# Patient Record
Sex: Male | Born: 1965 | Race: Black or African American | Hispanic: No | Marital: Married | Smoking: Former smoker
Health system: Southern US, Community
[De-identification: ages and names within clinical notes are randomized; demographics above are authoritative.]

## PROBLEM LIST (undated history)

## (undated) DIAGNOSIS — J869 Pyothorax without fistula: Secondary | ICD-10-CM

## (undated) DIAGNOSIS — M79673 Pain in unspecified foot: Secondary | ICD-10-CM

## (undated) DIAGNOSIS — J948 Other specified pleural conditions: Secondary | ICD-10-CM

## (undated) DIAGNOSIS — IMO0001 Reserved for inherently not codable concepts without codable children: Secondary | ICD-10-CM

## (undated) DIAGNOSIS — J80 Acute respiratory distress syndrome: Secondary | ICD-10-CM

## (undated) DIAGNOSIS — I10 Essential (primary) hypertension: Secondary | ICD-10-CM

## (undated) DIAGNOSIS — E786 Lipoprotein deficiency: Secondary | ICD-10-CM

## (undated) DIAGNOSIS — E119 Type 2 diabetes mellitus without complications: Secondary | ICD-10-CM

## (undated) DIAGNOSIS — Z9689 Presence of other specified functional implants: Secondary | ICD-10-CM

## (undated) DIAGNOSIS — E781 Pure hyperglyceridemia: Secondary | ICD-10-CM

## (undated) HISTORY — DX: Pure hyperglyceridemia: E78.1

## (undated) HISTORY — DX: Presence of other specified functional implants: Z96.89

## (undated) HISTORY — DX: Acute respiratory distress syndrome: J80

## (undated) HISTORY — DX: Pyothorax without fistula: J86.9

## (undated) HISTORY — DX: Lipoprotein deficiency: E78.6

## (undated) HISTORY — DX: Other specified pleural conditions: J94.8

## (undated) HISTORY — DX: Reserved for inherently not codable concepts without codable children: IMO0001

---
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
## 2005-06-07 ENCOUNTER — Emergency Department: Payer: Self-pay | Admitting: General Practice

## 2005-06-10 ENCOUNTER — Emergency Department: Payer: Self-pay | Admitting: Emergency Medicine

## 2007-08-12 ENCOUNTER — Emergency Department: Payer: Self-pay | Admitting: Emergency Medicine

## 2008-06-14 ENCOUNTER — Emergency Department: Payer: Self-pay | Admitting: Emergency Medicine

## 2008-08-24 ENCOUNTER — Emergency Department: Payer: Self-pay | Admitting: Emergency Medicine

## 2008-09-01 ENCOUNTER — Emergency Department: Payer: Self-pay | Admitting: Internal Medicine

## 2014-03-13 ENCOUNTER — Emergency Department: Payer: Self-pay | Admitting: Emergency Medicine

## 2014-12-23 ENCOUNTER — Ambulatory Visit: Payer: Self-pay | Admitting: Family Medicine

## 2014-12-23 DIAGNOSIS — E1142 Type 2 diabetes mellitus with diabetic polyneuropathy: Secondary | ICD-10-CM | POA: Insufficient documentation

## 2015-01-25 ENCOUNTER — Ambulatory Visit: Payer: Self-pay | Admitting: Family Medicine

## 2015-05-21 ENCOUNTER — Emergency Department
Admission: EM | Admit: 2015-05-21 | Discharge: 2015-05-21 | Disposition: A | Discharge status: Home / Self Care | Payer: No Typology Code available for payment source | Attending: Emergency Medicine | Admitting: Emergency Medicine

## 2015-05-21 ENCOUNTER — Encounter: Payer: Self-pay | Admitting: *Deleted

## 2015-05-21 ENCOUNTER — Emergency Department: Payer: No Typology Code available for payment source

## 2015-05-21 DIAGNOSIS — S56417A Strain of extensor muscle, fascia and tendon of right little finger at forearm level, initial encounter: Secondary | ICD-10-CM | POA: Diagnosis not present

## 2015-05-21 DIAGNOSIS — W010XXA Fall on same level from slipping, tripping and stumbling without subsequent striking against object, initial encounter: Secondary | ICD-10-CM | POA: Insufficient documentation

## 2015-05-21 DIAGNOSIS — M20031 Swan-neck deformity of right finger(s): Secondary | ICD-10-CM

## 2015-05-21 DIAGNOSIS — Y92009 Unspecified place in unspecified non-institutional (private) residence as the place of occurrence of the external cause: Secondary | ICD-10-CM | POA: Diagnosis not present

## 2015-05-21 DIAGNOSIS — Z87891 Personal history of nicotine dependence: Secondary | ICD-10-CM | POA: Insufficient documentation

## 2015-05-21 DIAGNOSIS — E119 Type 2 diabetes mellitus without complications: Secondary | ICD-10-CM | POA: Insufficient documentation

## 2015-05-21 DIAGNOSIS — S6991XA Unspecified injury of right wrist, hand and finger(s), initial encounter: Secondary | ICD-10-CM | POA: Diagnosis present

## 2015-05-21 DIAGNOSIS — I10 Essential (primary) hypertension: Secondary | ICD-10-CM | POA: Diagnosis not present

## 2015-05-21 DIAGNOSIS — Y9389 Activity, other specified: Secondary | ICD-10-CM | POA: Diagnosis not present

## 2015-05-21 DIAGNOSIS — Y998 Other external cause status: Secondary | ICD-10-CM | POA: Diagnosis not present

## 2015-05-21 DIAGNOSIS — M6789 Other specified disorders of synovium and tendon, multiple sites: Secondary | ICD-10-CM

## 2015-05-21 HISTORY — DX: Essential (primary) hypertension: I10

## 2015-05-21 HISTORY — DX: Type 2 diabetes mellitus without complications: E11.9

## 2015-05-21 MED ORDER — KETOROLAC TROMETHAMINE 10 MG PO TABS: 10.0000 mg | ORAL_TABLET | Freq: Three times a day (TID) | ORAL | Status: DC

## 2015-05-21 MED ORDER — TRAMADOL HCL 50 MG PO TABS
ORAL_TABLET | ORAL | Status: DC
Start: 2015-05-21 — End: 2015-05-22
  Filled 2015-05-21: qty 2

## 2015-05-21 MED ORDER — TRAMADOL HCL 50 MG PO TABS
100.0000 mg | ORAL_TABLET | Freq: Once | ORAL | Status: AC
  Administered 2015-05-21: 100 mg via ORAL

## 2015-05-21 NOTE — ED Notes (Signed)
Pt. Discharged by previous shift RN, unsure of departure condition.

## 2015-05-21 NOTE — ED Notes (Signed)
Pain right 5th finger

## 2015-05-21 NOTE — ED Notes (Signed)
Unsure of pain condition upon leaving flex dept.

## 2015-05-21 NOTE — Discharge Instructions (Signed)
Wear the splint, continuously, until cleared by ortho. Call Dr. Carlos LeveringGramig's office for follow-up care.

## 2015-05-21 NOTE — ED Provider Notes (Signed)
[facility] Emergency Department Provider Note ____________________________________________  Time seen: 1619  I have reviewed the triage vital signs and the nursing notes.  HISTORY  Chief Complaint  Finger Injury  HPI Nathaniel Webb is a 49 y.o. male, right handed, with complaint of deformity to the right pinky at the distal knuckle, after injury at home. He describes it on July 4, he tripped and fell, jagged his fingers into the ground. He noted some swelling to the distal pinky knuckle, and now notes difficulty extending his finger tip.  Past Medical History  Diagnosis Date  . Diabetes mellitus without complication   . Hypertension    There are no active problems to display for this patient.  History reviewed. No pertinent past surgical history.  No current outpatient prescriptions on file.  Allergies Review of patient's allergies indicates no known allergies.  No family history on file.  Social History History  Substance Use Topics  . Smoking status: Former Games developermoker  . Smokeless tobacco: Not on file  . Alcohol Use: No   Review of Systems  Constitutional: Negative for fever. Eyes: Negative for visual changes. ENT: Negative for sore throat. Cardiovascular: Negative for chest pain. Respiratory: Negative for shortness of breath. Gastrointestinal: Negative for abdominal pain, vomiting and diarrhea. Genitourinary: Negative for dysuria. Musculoskeletal: Negative for back pain. Deformity at the right pinky Skin: Negative for rash. Neurological: Negative for headaches, focal weakness or numbness. ____________________________________________  PHYSICAL EXAM:  VITAL SIGNS: ED Triage Vitals  Enc Vitals Group     BP 05/21/15 1538 149/83 mmHg     Pulse Rate 05/21/15 1538 70     Resp 05/21/15 1538 20     Temp 05/21/15 1538 98.2 F (36.8 C)     Temp Source 05/21/15 1538 Oral     SpO2 05/21/15 1538 98 %     Weight 05/21/15 1538 234 lb (106.142  kg)     Height 05/21/15 1538 5\' 5"  (1.651 m)     Head Cir --      Peak Flow --      Pain Score --      Pain Loc --      Pain Edu? --      Excl. in [place]? --    Constitutional: Alert and oriented. Well appearing and in no distress. Eyes: Conjunctivae are normal. PERRL. Normal extraocular movements. ENT   Head: Normocephalic and atraumatic.   Nose: No congestion/rhinnorhea.   Mouth/Throat: Mucous membranes are moist.   Neck: Supple. No thyromegaly. Cardiovascular: Normal rate, regular rhythm.  Respiratory: Normal respiratory effort.  Musculoskeletal: Right pinky with swan-neck deformity at the DIP. Patient unable to actively extend the DIP joint of the right pinky. Normal composite fist.  Neurologic:  Normal gait without ataxia. Normal speech and language. No gross focal neurologic deficits are appreciated. Skin:  Skin is warm, dry and intact. No rash noted. Psychiatric: Mood and affect are normal. Patient exhibits appropriate insight and judgment. ____________________________________________   RADIOLOGY Right Pinky IMPRESSION: 1. Punctate avulsion fracture from the dorsal aspect of the base of the distal phalanx.  I, Nathaniel Webb, Nathaniel IvoryJenise V Webb, personally viewed and evaluated these images as part of my medical decision making.  ____________________________________________  PROCEDURES  Right pinky DIP extensor splint placed by tech Ultram 100 mg PO ____________________________________________  INITIAL IMPRESSION / ASSESSMENT AND PLAN / ED COURSE  Acute right pinky DIP extensor tendon injury. Dynamic splint applied for support.  Prescription for Toradol #15 given. Referral to Dr. Amanda PeaGramig  for further evaluation and management.  ____________________________________________  FINAL CLINICAL IMPRESSION(S) / ED DIAGNOSES  Final diagnoses:  Swan-neck deformity, acquired, right  Extensor tendon disruption     Lissa Hoard, PA-C 05/21/15 1728  Nathaniel Webb  [place], PA-C 05/21/15 1737  Sharyn Creamer, MD 05/21/15 2119

## 2015-05-31 ENCOUNTER — Ambulatory Visit: Payer: Self-pay | Admitting: Family Medicine

## 2015-06-09 ENCOUNTER — Ambulatory Visit (INDEPENDENT_AMBULATORY_CARE_PROVIDER_SITE_OTHER): Payer: No Typology Code available for payment source | Admitting: Family Medicine

## 2015-06-09 ENCOUNTER — Encounter: Payer: Self-pay | Admitting: Family Medicine

## 2015-06-09 VITALS — BP 132/90 | HR 75 | Temp 97.8°F | Resp 18 | Ht 65.0 in | Wt 233.2 lb

## 2015-06-09 DIAGNOSIS — G8929 Other chronic pain: Secondary | ICD-10-CM | POA: Insufficient documentation

## 2015-06-09 DIAGNOSIS — M545 Low back pain, unspecified: Secondary | ICD-10-CM | POA: Insufficient documentation

## 2015-06-09 DIAGNOSIS — I10 Essential (primary) hypertension: Secondary | ICD-10-CM | POA: Diagnosis not present

## 2015-06-09 DIAGNOSIS — Z125 Encounter for screening for malignant neoplasm of prostate: Secondary | ICD-10-CM | POA: Insufficient documentation

## 2015-06-09 DIAGNOSIS — IMO0001 Reserved for inherently not codable concepts without codable children: Secondary | ICD-10-CM

## 2015-06-09 DIAGNOSIS — M79673 Pain in unspecified foot: Secondary | ICD-10-CM | POA: Insufficient documentation

## 2015-06-09 DIAGNOSIS — L209 Atopic dermatitis, unspecified: Secondary | ICD-10-CM | POA: Insufficient documentation

## 2015-06-09 DIAGNOSIS — R0602 Shortness of breath: Secondary | ICD-10-CM | POA: Insufficient documentation

## 2015-06-09 DIAGNOSIS — E1142 Type 2 diabetes mellitus with diabetic polyneuropathy: Secondary | ICD-10-CM

## 2015-06-09 DIAGNOSIS — E1140 Type 2 diabetes mellitus with diabetic neuropathy, unspecified: Secondary | ICD-10-CM | POA: Diagnosis not present

## 2015-06-09 HISTORY — DX: Pain in unspecified foot: M79.673

## 2015-06-09 LAB — POCT GLYCOSYLATED HEMOGLOBIN (HGB A1C): Hemoglobin A1C: 6.4

## 2015-06-09 LAB — POCT UA - MICROALBUMIN: Microalbumin Ur, POC: 20 mg/L

## 2015-06-09 MED ORDER — GLUCOSE BLOOD VI STRP: ORAL_STRIP | Status: DC

## 2015-06-09 MED ORDER — ACCU-CHEK MULTICLIX LANCETS MISC: Status: DC

## 2015-06-09 MED ORDER — BLOOD PRESSURE MONITOR KIT: 1.0000 | PACK | Freq: Every day | Status: DC

## 2015-06-09 MED ORDER — ACCU-CHEK AVIVA DEVI: Status: DC

## 2015-06-09 NOTE — Patient Instructions (Signed)

## 2015-06-09 NOTE — Progress Notes (Signed)
Name: Nathaniel Webb   MRN: [id number]    DOB: Apr 10, 1966   Date:06/09/2015       Progress Note  Subjective  Chief Complaint  Chief Complaint  Patient presents with  . Hypertension    patient is here for a 66-monthof his hypertension. Patient states that he gets headaches at times. Yesterday patient got a little dizzy after working outside.  . Fatigue  . Medication Refill  . Diabetes    patient wants to know how he can get some diabetic shoes    HPI  Patient is here for routine follow up of Hypertension. First diagnosed with hypertension several years ago. Current anti-hypertension medication regimen includes dietary modification, weight management and Lisinopril 235mdaily. Patient is following physician recommended management. Not checking blood pressure outside of physician office.  Associated symptoms do include fatigue, headache, occasional dizziness. Do not include nausea, lower extremity swelling, shortness of breath, chest pain, numbness.  Patient is here for routine follow up of Diabetes Type II.  Current diabetes medication regimen includes Metformin 5007mnce a day along with lifestyle changes. Patient is taking medications as instructed. Overall the patient feels that their blood glucose is well controlled. Not checking blood glucose as home but would be interested if given prescriptions for testing supply. Also interested in diabetic shoes. Needs referral for eye exam.     Past Medical History  Diagnosis Date  . Diabetes mellitus without complication   . Hypertension     History reviewed. No pertinent past surgical history.  Family History  Problem Relation Age of Onset  . Diabetes Father     History   Social History  . Marital Status: Single    Spouse Name: N/A  . Number of Children: N/A  . Years of Education: N/A   Occupational History  . Not on file.   Social History Main Topics  . 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 Alcohol Use: No  . Drug Use: No  . Sexual Activity:    Partners: Female    [employer]gallery curatorone   Other Topics Concern  . Not on file   Social History Narrative     Current outpatient prescriptions:  .  Acetaminophen 500 MG coapsule, Take by mouth., Disp: , Rfl:  .  Cholecalciferol (VITAMIN D3) 50000 UNITS CAPS, Take by mouth., Disp: , Rfl:  .  cyclobenzaprine (FLEXERIL) 5 MG tablet, Take by mouth., Disp: , Rfl:  .  ketorolac (TORADOL) 10 MG tablet, Take 1 tablet (10 mg total) by mouth every 8 (eight) hours., Disp: 15 tablet, Rfl: 0 .  lisinopril (PRINIVIL,ZESTRIL) 20 MG tablet, Take by mouth., Disp: , Rfl:  .  meloxicam (MOBIC) 15 MG tablet, Take by mouth., Disp: , Rfl:  .  metFORMIN (GLUCOPHAGE) 500 MG tablet, Take by mouth., Disp: , Rfl:   No Known Allergies   ROS  CONSTITUTIONAL: No significant weight changes, fever, chills, weakness or fatigue.  HEENT:  - Eyes: No visual changes.  - Ears: No auditory changes. No pain.  - Nose: No sneezing, congestion, runny nose. - Throat: No sore throat. No changes in swallowing. SKIN: No rash or itching.  CARDIOVASCULAR: No chest pain, chest pressure or chest discomfort. No palpitations or edema.  RESPIRATORY: No shortness of breath, cough or sputum.  GASTROINTESTINAL: No anorexia, nausea, vomiting. No changes in bowel habits. No abdominal pain or blood.  GENITOURINARY: No dysuria. No frequency. No discharge.  NEUROLOGICAL: No  headache, dizziness, syncope, paralysis, ataxia, numbness or tingling in the extremities. No memory changes. No change in bowel or bladder control.  MUSCULOSKELETAL: No joint pain. No muscle pain. HEMATOLOGIC: No anemia, bleeding or bruising.  LYMPHATICS: No enlarged lymph nodes.  PSYCHIATRIC: No change in mood. No change in sleep pattern.  ENDOCRINOLOGIC: No reports of sweating, cold or heat intolerance. No polyuria or polydipsia.   Objective  Filed Vitals:   06/09/15 1042  BP: 132/90   Pulse: 75  Temp: 97.8 F (36.6 C)  TempSrc: Oral  Resp: 18  Height: 5' 5" (1.651 m)  Weight: 233 lb 3.2 oz (105.779 kg)  SpO2: 97%   Body mass index is 38.81 kg/(m^2).  Physical Exam  Constitutional: Patient is obese and well-nourished. In no distress.  HEENT:  - Head: Normocephalic and atraumatic.  - Ears: Bilateral TMs gray, no erythema or effusion - Nose: Nasal mucosa moist - Mouth/Throat: Oropharynx is clear and moist. No tonsillar hypertrophy or erythema. No post nasal drainage.  - Eyes: Conjunctivae clear, EOM movements normal. PERRLA. No scleral icterus.  Neck: Normal range of motion. Neck supple. No JVD present. No thyromegaly present.  Cardiovascular: Normal rate, regular rhythm and normal heart sounds.  No murmur heard.  Pulmonary/Chest: Effort normal and breath sounds normal. No respiratory distress. Musculoskeletal: Normal range of motion bilateral UE and LE, no joint effusions. Peripheral vascular: Bilateral LE no edema. Neurological: CN II-XII grossly intact with no focal deficits. Alert and oriented to person, place, and time. Coordination, balance, strength, speech and gait are normal.  Skin: Skin is warm and dry. No rash noted. No erythema.  Psychiatric: Patient has a normal mood and affect. Behavior is normal in office today. Judgment and thought content normal in office today.  Diabetic Foot Exam - Simple   Simple Foot Form  Visual Inspection  No deformities, no ulcerations, no other skin breakdown bilaterally:  Yes  Sensation Testing  Intact to touch and monofilament testing bilaterally:  Yes  See comments:  Yes  Pulse Check  Posterior Tibialis and Dorsalis pulse intact bilaterally:  Yes  Comments  Diminished sensation left foot heel.        Assessment & Plan  1. Type 2 diabetes, controlled, with peripheral neuropathy Hba1c at goal today's result 6.4%  Patient's Hba1c goal is <6.5% for stringent control, <7% is acceptable and <8% is reasonable if  elderly and is at risk for hypoglycemic events.   Encouraged patient to continue efforts on checking blood glucose on a daily basis. Fasting blood glucose control goal is 80-138m/dL and post prandial blood glucose control is <1815mdL.  Reviewed diet, exercise, lifestyle changes and current medication regimen pertaining to diabetes with the patient.   - [place] for diabetic eye exam - Diabetic shoes handwritten Rx - Blood Pressure Monitor KIT; 1 Device by Does not apply route daily.  Dispense: 1 each; Refill: 0 - Lancets (ACCU-CHEK MULTICLIX) lancets; Use as instructed  Dispense: 100 each; Refill: 5 - Blood Glucose Monitoring Suppl (ACCU-CHEK AVIVA) device; Use as instructed  Dispense: 1 each; Refill: 0 - glucose blood (ACCU-CHEK AVIVA) test strip; Use as instructed  Dispense: 100 each; Refill: 5  2. Obesity, Class II, BMI 35-39.9, with comorbidity Comorbidity HTN, Diabetes.  The patient has been counseled on their higher than normal BMI.  They have verbally expressed understanding their increased risk for other diseases.  In efforts to meet a better target BMI goal the patient has been counseled on lifestyle, diet and exercise  modification tactics. Start with moderate intensity aerobic exercise (walking, jogging, elliptical, swimming, group or individual sports, hiking) at least 67mns a day at least 4 days a week and increase intensity, duration, frequency as tolerated. Diet should include well balance fresh fruits and vegetables avoiding processed foods, carbohydrates and sugars. Drink at least 8oz 10 glasses a day avoiding sodas, sugary fruit drinks, sweetened tea. Check weight on a reliable scale daily and monitor weight loss progress daily. Consider investing in mobile phone apps that will help keep track of weight loss goals.   - POCT glycosylated hemoglobin (Hb A1C) - POCT UA - Microalbumin  3. Hypertension goal BP (blood pressure) < 140/90 JNC-8 vs ADA treatment goals  discussed with patient. Current blood pressure well controled with exception to borderline diastolic pressure today. Continue current regimen of anti-hypertensive medications. On review of most current eGFR on blood work there is not concern for Chronic Kidney Disease at this time.

## 2015-08-02 ENCOUNTER — Other Ambulatory Visit: Payer: Self-pay | Admitting: Family Medicine

## 2015-08-03 ENCOUNTER — Other Ambulatory Visit: Payer: Self-pay | Admitting: Family Medicine

## 2015-08-04 ENCOUNTER — Other Ambulatory Visit: Payer: Self-pay | Admitting: Family Medicine

## 2015-11-24 ENCOUNTER — Emergency Department
Admission: EM | Admit: 2015-11-24 | Discharge: 2015-11-24 | Disposition: A | Discharge status: Home / Self Care | Payer: BLUE CROSS/BLUE SHIELD | Attending: Student | Admitting: Student

## 2015-11-24 DIAGNOSIS — Z79899 Other long term (current) drug therapy: Secondary | ICD-10-CM | POA: Diagnosis not present

## 2015-11-24 DIAGNOSIS — Z791 Long term (current) use of non-steroidal anti-inflammatories (NSAID): Secondary | ICD-10-CM | POA: Insufficient documentation

## 2015-11-24 DIAGNOSIS — Z7984 Long term (current) use of oral hypoglycemic drugs: Secondary | ICD-10-CM | POA: Diagnosis not present

## 2015-11-24 DIAGNOSIS — Z87891 Personal history of nicotine dependence: Secondary | ICD-10-CM | POA: Diagnosis not present

## 2015-11-24 DIAGNOSIS — I10 Essential (primary) hypertension: Secondary | ICD-10-CM | POA: Diagnosis not present

## 2015-11-24 DIAGNOSIS — J069 Acute upper respiratory infection, unspecified: Secondary | ICD-10-CM | POA: Diagnosis not present

## 2015-11-24 DIAGNOSIS — E1142 Type 2 diabetes mellitus with diabetic polyneuropathy: Secondary | ICD-10-CM | POA: Insufficient documentation

## 2015-11-24 DIAGNOSIS — Z794 Long term (current) use of insulin: Secondary | ICD-10-CM | POA: Insufficient documentation

## 2015-11-24 DIAGNOSIS — R0981 Nasal congestion: Secondary | ICD-10-CM | POA: Diagnosis present

## 2015-11-24 MED ORDER — PSEUDOEPHEDRINE HCL 60 MG PO TABS: 60.0000 mg | ORAL_TABLET | ORAL | Status: DC | PRN

## 2015-11-24 MED ORDER — PSEUDOEPHEDRINE HCL 60 MG PO TABS: 60.0000 mg | ORAL_TABLET | Freq: Once | ORAL | Status: DC

## 2015-11-24 MED ORDER — AZITHROMYCIN 250 MG PO TABS
500.0000 mg | ORAL_TABLET | Freq: Once | ORAL | Status: AC
  Administered 2015-11-24: 500 mg via ORAL
  Filled 2015-11-24: qty 2

## 2015-11-24 MED ORDER — AZITHROMYCIN 250 MG PO TABS: ORAL_TABLET | ORAL | Status: DC

## 2015-11-24 NOTE — Discharge Instructions (Signed)
Upper Respiratory Infection, Adult Most upper respiratory infections (URIs) are a viral infection of the air passages leading to the lungs. A URI affects the nose, throat, and upper air passages. The most common type of URI is nasopharyngitis and is typically referred to as "the common cold." URIs run their course and usually go away on their own. Most of the time, a URI does not require medical attention, but sometimes a bacterial infection in the upper airways can follow a viral infection. This is called a secondary infection. Sinus and middle ear infections are common types of secondary upper respiratory infections. Bacterial pneumonia can also complicate a URI. A URI can worsen asthma and chronic obstructive pulmonary disease (COPD). Sometimes, these complications can require emergency medical care and may be life threatening.  CAUSES Almost all URIs are caused by viruses. A virus is a type of germ and can spread from one person to another.  RISKS FACTORS You may be at risk for a URI if:   You smoke.   You have chronic heart or lung disease.  You have a weakened defense (immune) system.   You are very young or very old.   You have nasal allergies or asthma.  You work in crowded or poorly ventilated areas.  You work in health care facilities or schools. SIGNS AND SYMPTOMS  Symptoms typically develop 2-3 days after you come in contact with a cold virus. Most viral URIs last 7-10 days. However, viral URIs from the influenza virus (flu virus) can last 14-18 days and are typically more severe. Symptoms may include:   Runny or stuffy (congested) nose.   Sneezing.   Cough.   Sore throat.   Headache.   Fatigue.   Fever.   Loss of appetite.   Pain in your forehead, behind your eyes, and over your cheekbones (sinus pain).  Muscle aches.  DIAGNOSIS  Your health care provider may diagnose a URI by:  Physical exam.  Tests to check that your symptoms are not due to  another condition such as:  Strep throat.  Sinusitis.  Pneumonia.  Asthma. TREATMENT  A URI goes away on its own with time. It cannot be cured with medicines, but medicines may be prescribed or recommended to relieve symptoms. Medicines may help:  Reduce your fever.  Reduce your cough.  Relieve nasal congestion. HOME CARE INSTRUCTIONS   Take medicines only as directed by your health care provider.   Gargle warm saltwater or take cough drops to comfort your throat as directed by your health care provider.  Use a warm mist humidifier or inhale steam from a shower to increase air moisture. This may make it easier to breathe.  Drink enough fluid to keep your urine clear or pale yellow.   Eat soups and other clear broths and maintain good nutrition.   Rest as needed.   Return to work when your temperature has returned to normal or as your health care provider advises. You may need to stay home longer to avoid infecting others. You can also use a face mask and careful hand washing to prevent spread of the virus.  Increase the usage of your inhaler if you have asthma.   Do not use any tobacco products, including cigarettes, chewing tobacco, or electronic cigarettes. If you need help quitting, ask your health care provider. PREVENTION  The best way to protect yourself from getting a cold is to practice good hygiene.   Avoid oral or hand contact with people with cold   symptoms.   Wash your hands often if contact occurs.  There is no clear evidence that vitamin C, vitamin E, echinacea, or exercise reduces the chance of developing a cold. However, it is always recommended to get plenty of rest, exercise, and practice good nutrition.  SEEK MEDICAL CARE IF:   You are getting worse rather than better.   Your symptoms are not controlled by medicine.   You have chills.  You have worsening shortness of breath.  You have brown or red mucus.  You have yellow or brown nasal  discharge.  You have pain in your face, especially when you bend forward.  You have a fever.  You have swollen neck glands.  You have pain while swallowing.  You have white areas in the back of your throat. SEEK IMMEDIATE MEDICAL CARE IF:   You have severe or persistent:  Headache.  Ear pain.  Sinus pain.  Chest pain.  You have chronic lung disease and any of the following:  Wheezing.  Prolonged cough.  Coughing up blood.  A change in your usual mucus.  You have a stiff neck.  You have changes in your:  Vision.  Hearing.  Thinking.  Mood. MAKE SURE YOU:   Understand these instructions.  Will watch your condition.  Will get help right away if you are not doing well or get worse.   This information is not intended to replace advice given to you by your health care provider. Make sure you discuss any questions you have with your health care provider.   Document Released: 04/25/2001 Document Revised: 03/16/2015 Document Reviewed: 02/04/2014 Elsevier Interactive Patient Education 2016 Elsevier Inc.  

## 2015-11-24 NOTE — ED Provider Notes (Signed)
[facility] Emergency Department Provider Note  ____________________________________________  Time seen: Approximately 6:34 PM  I have reviewed the triage vital signs and the nursing notes.   HISTORY  Chief Complaint Nasal Congestion    HPI Nathaniel Webb is a 50 y.o. male who presents with complaints of sinus congestion 3 months. States that his nose feels full. Denies any fever chills nausea vomiting.   Past Medical History  Diagnosis Date  . Diabetes mellitus without complication ([place])   . Hypertension     Patient Active Problem List   Diagnosis Date Noted  . AD (atopic dermatitis) 06/09/2015  . Chronic LBP 06/09/2015  . Heel pain 06/09/2015  . Hypertension goal BP (blood pressure) < 140/90 06/09/2015  . Encounter for screening for malignant neoplasm of prostate 06/09/2015  . Shortness of breath at rest 06/09/2015  . Type 2 diabetes, controlled, with peripheral neuropathy ([place]) 12/23/2014    History reviewed. No pertinent past surgical history.  Current Outpatient Rx  Name  Route  Sig  Dispense  Refill  . Acetaminophen 500 MG coapsule   Oral   Take by mouth.         Marland Kitchen azithromycin (ZITHROMAX Z-PAK) 250 MG tablet      Take 2 tablets (500 mg) on  Day 1,  followed by 1 tablet (250 mg) once daily on Days 2 through 5.   6 each   0   . Blood Glucose Monitoring Suppl (ACCU-CHEK AVIVA) device      Use as instructed   1 each   0   . Blood Pressure Monitor KIT   Does not apply   1 Device by Does not apply route daily.   1 each   0     One automatic blood pressure monitoring kit with u ...   . Cholecalciferol (VITAMIN D3) 50000 UNITS CAPS   Oral   Take by mouth.         . cyclobenzaprine (FLEXERIL) 5 MG tablet   Oral   Take by mouth.         Marland Kitchen glucose blood (ACCU-CHEK AVIVA) test strip      Use as instructed   100 each   5   . ketorolac (TORADOL) 10 MG tablet   Oral   Take 1 tablet (10 mg total) by mouth every 8  (eight) hours.   15 tablet   0   . Lancets (ACCU-CHEK MULTICLIX) lancets      Use as instructed   100 each   5   . lisinopril (PRINIVIL,ZESTRIL) 20 MG tablet      TAKE 1 TABLET BY MOUTH DAILY   90 tablet   3   . meloxicam (MOBIC) 15 MG tablet      TAKE 1 TABLET BY MOUTH EVERY DAY   90 tablet   3   . metFORMIN (GLUCOPHAGE) 500 MG tablet      TAKE 1 TABLET BY MOUTH DAILY   90 tablet   1   . pseudoephedrine (SUDAFED) 60 MG tablet   Oral   Take 1 tablet (60 mg total) by mouth every 4 (four) hours as needed for congestion.   24 tablet   0     Allergies Review of patient's allergies indicates no known allergies.  Family History  Problem Relation Age of Onset  . Diabetes Father     Social History Social History  Substance Use Topics  . Smoking status: Former Research scientist (life sciences)  . Smokeless tobacco: None  .  Alcohol Use: No    Review of Systems Constitutional: No fever/chills Eyes: No visual changes. ENT: No sore throat. Positive for left sided maxillary and frontal sinus pressure. As a for nasal congestion. Cardiovascular: Denies chest pain. Respiratory: Denies shortness of breath. Gastrointestinal: No abdominal pain.  No nausea, no vomiting.  No diarrhea.  No constipation. Genitourinary: Negative for dysuria. Musculoskeletal: Negative for back pain. Skin: Negative for rash. Neurological: Negative for headaches, focal weakness or numbness.  10-point ROS otherwise negative.  ____________________________________________   PHYSICAL EXAM:  VITAL SIGNS: ED Triage Vitals  Enc Vitals Group     BP 11/24/15 1819 196/96 mmHg     Pulse Rate 11/24/15 1819 71     Resp 11/24/15 1819 20     Temp 11/24/15 1819 98.4 F (36.9 C)     Temp Source 11/24/15 1819 Oral     SpO2 11/24/15 1819 96 %     Weight 11/24/15 1819 234 lb (106.142 kg)     Height 11/24/15 1819 '5\' 5"'  (1.651 m)     Head Cir --      Peak Flow --      Pain Score --      Pain Loc --      Pain Edu? --       Excl. in [place]? --     Constitutional: Alert and oriented. Well appearing and in no acute distress. Head: Atraumatic. Nose: Positive congestion/negative rhinnorhea. Turbinates swollen bilaterally. Mouth/Throat: Mucous membranes are moist.  Oropharynx non-erythematous. Neck: No stridor.   Cardiovascular: Normal rate, regular rhythm. Grossly normal heart sounds.  Good peripheral circulation. Respiratory: Normal respiratory effort.  No retractions. Lungs CTAB. Musculoskeletal: No lower extremity tenderness nor edema.  No joint effusions. Neurologic:  Normal speech and language. No gross focal neurologic deficits are appreciated. No gait instability. Skin:  Skin is warm, dry and intact. No rash noted. Psychiatric: Mood and affect are normal. Speech and behavior are normal.  ____________________________________________   LABS (all labs ordered are listed, but only abnormal results are displayed)  Labs Reviewed - No data to display    PROCEDURES  Procedure(s) performed: None  Critical Care performed: No  ____________________________________________   INITIAL IMPRESSION / ASSESSMENT AND PLAN / ED COURSE  Pertinent labs & imaging results that were available during my care of the patient were reviewed by me and considered in my medical decision making (see chart for details).  URI with sinus congestion and pressure. Rx given for Zithromax, Sudafed 60 mg every 6 hours. Patient follow-up PCP or return here with any worsening symptomology. ____________________________________________   FINAL CLINICAL IMPRESSION(S) / ED DIAGNOSES  Final diagnoses:  URI, acute      Arlyss Repress, PA-C 11/24/15 1847  Joanne Gavel, MD 11/24/15 2308

## 2015-11-24 NOTE — ED Notes (Signed)
Nasal congestion X 3 months per patient. Pt unable to rest last night due to nasal congestion. Congestion is clear per patient. Pt alert and oriented X4, active, cooperative, pt in NAD. RR even and unlabored, color WNL.

## 2015-11-24 NOTE — ED Notes (Signed)
Pt hx of hypertension. Pt states that his compliant with medication.

## 2015-12-13 ENCOUNTER — Ambulatory Visit: Payer: No Typology Code available for payment source | Admitting: Family Medicine

## 2015-12-22 ENCOUNTER — Ambulatory Visit: Payer: BLUE CROSS/BLUE SHIELD | Admitting: Family Medicine

## 2015-12-29 ENCOUNTER — Ambulatory Visit: Payer: BLUE CROSS/BLUE SHIELD | Admitting: Family Medicine

## 2016-01-04 ENCOUNTER — Encounter: Payer: Self-pay | Admitting: Family Medicine

## 2016-01-04 ENCOUNTER — Ambulatory Visit (INDEPENDENT_AMBULATORY_CARE_PROVIDER_SITE_OTHER): Payer: BLUE CROSS/BLUE SHIELD | Admitting: Family Medicine

## 2016-01-04 VITALS — BP 132/80 | HR 81 | Temp 98.2°F | Resp 16 | Wt 224.8 lb

## 2016-01-04 DIAGNOSIS — Z13220 Encounter for screening for lipoid disorders: Secondary | ICD-10-CM

## 2016-01-04 DIAGNOSIS — Z136 Encounter for screening for cardiovascular disorders: Secondary | ICD-10-CM

## 2016-01-04 DIAGNOSIS — I10 Essential (primary) hypertension: Secondary | ICD-10-CM | POA: Diagnosis not present

## 2016-01-04 DIAGNOSIS — IMO0001 Reserved for inherently not codable concepts without codable children: Secondary | ICD-10-CM | POA: Insufficient documentation

## 2016-01-04 DIAGNOSIS — J069 Acute upper respiratory infection, unspecified: Secondary | ICD-10-CM | POA: Diagnosis not present

## 2016-01-04 DIAGNOSIS — E119 Type 2 diabetes mellitus without complications: Secondary | ICD-10-CM

## 2016-01-04 DIAGNOSIS — Z113 Encounter for screening for infections with a predominantly sexual mode of transmission: Secondary | ICD-10-CM

## 2016-01-04 DIAGNOSIS — Z23 Encounter for immunization: Secondary | ICD-10-CM | POA: Diagnosis not present

## 2016-01-04 HISTORY — DX: Reserved for inherently not codable concepts without codable children: IMO0001

## 2016-01-04 LAB — POCT UA - MICROALBUMIN: Microalbumin Ur, POC: 0 mg/L

## 2016-01-04 LAB — POCT GLYCOSYLATED HEMOGLOBIN (HGB A1C): HEMOGLOBIN A1C: 6.3

## 2016-01-04 MED ORDER — LISINOPRIL 20 MG PO TABS: 20.0000 mg | ORAL_TABLET | Freq: Every day | ORAL | Status: DC

## 2016-01-04 MED ORDER — METFORMIN HCL 500 MG PO TABS: 500.0000 mg | ORAL_TABLET | Freq: Every day | ORAL | Status: DC

## 2016-01-04 NOTE — Progress Notes (Signed)
Name: Nathaniel Webb   MRN: [id number]    DOB: 02-07-1966   Date:01/04/2016       Progress Note  Subjective  Chief Complaint  Chief Complaint  Patient presents with  . Medication Refill  . Diabetes    not checking sugar, need supplies  . Hypertension  . URI    HPI  Nathaniel Webb is a 50 year old male here for routine follow up of chronic conditions but also notes acute URI symptoms.  First diagnosed with hypertension several years ago. Current anti-hypertension medication regimen includes dietary modification, weight management and Lisinopril 67m daily. Patient is following physician recommended management. Not checking blood pressure outside of physician office. Associated symptoms do include fatigue, headache, occasional dizziness. Do not include nausea, lower extremity swelling, shortness of breath, chest pain, numbness.  Current diabetes medication regimen includes Metformin 5044monce a day along with lifestyle changes. Patient is taking medications as instructed. Overall the patient feels that their blood glucose is well controlled. Not checking blood glucose as home but would be interested if given prescriptions for testing supply.   Patient is here today with concerns regarding the following symptoms congestion, sneezing, sinus pressure and non productive cough that started weeks ago.  Associated with fatigue. Not associated with fever. Has tried the following home remedies: Went to ER was prescribed zpak and otc sinus meds.    Past Medical History  Diagnosis Date  . Diabetes mellitus without complication ([place]  . Hypertension     Social History  Substance Use Topics  . Smoking status: Former SmResearch scientist (life sciences). Smokeless tobacco: Not on file  . Alcohol Use: No     Current outpatient prescriptions:  .  terbinafine (LAMISIL AT) 1 % cream, , Disp: , Rfl:  .  triamcinolone lotion (KENALOG) 0.1 %, , Disp: , Rfl:  .  Acetaminophen 500 MG coapsule, Take by mouth., Disp: , Rfl:   .  Blood Glucose Monitoring Suppl (ACCU-CHEK AVIVA) device, Use as instructed, Disp: 1 each, Rfl: 0 .  Blood Pressure Monitor KIT, 1 Device by Does not apply route daily., Disp: 1 each, Rfl: 0 .  cyclobenzaprine (FLEXERIL) 5 MG tablet, Take by mouth., Disp: , Rfl:  .  glucose blood (ACCU-CHEK AVIVA) test strip, Use as instructed, Disp: 100 each, Rfl: 5 .  Lancets (ACCU-CHEK MULTICLIX) lancets, Use as instructed, Disp: 100 each, Rfl: 5 .  lisinopril (PRINIVIL,ZESTRIL) 20 MG tablet, TAKE 1 TABLET BY MOUTH DAILY, Disp: 90 tablet, Rfl: 3 .  meloxicam (MOBIC) 15 MG tablet, TAKE 1 TABLET BY MOUTH EVERY DAY, Disp: 90 tablet, Rfl: 3 .  metFORMIN (GLUCOPHAGE) 500 MG tablet, TAKE 1 TABLET BY MOUTH DAILY, Disp: 90 tablet, Rfl: 1  No Known Allergies  ROS  CONSTITUTIONAL: No significant weight changes, fever, chills, weakness. Yes fatigue.  HEENT:  - Eyes: No visual changes.  - Ears: No auditory changes. No pain.  - Nose: Yes nasal and sinus congestion. - Throat: No sore throat. No changes in swallowing. SKIN: No rash or itching.  CARDIOVASCULAR: No chest pain, chest pressure or chest discomfort. No palpitations or edema.  RESPIRATORY: No shortness of breath, cough or sputum.  GASTROINTESTINAL: No anorexia, nausea, vomiting. No changes in bowel habits. No abdominal pain or blood.  GENITOURINARY: No dysuria. No frequency. No discharge. NEUROLOGICAL: No headache, dizziness, syncope, paralysis, ataxia, numbness or tingling in the extremities. No memory changes. No change in bowel or bladder control.  MUSCULOSKELETAL: No joint pain. No muscle pain. HEMATOLOGIC:  No anemia, bleeding or bruising.  LYMPHATICS: No enlarged lymph nodes.  PSYCHIATRIC: No change in mood. No change in sleep pattern.  ENDOCRINOLOGIC: No reports of sweating, cold or heat intolerance. No polyuria or polydipsia.      Objective  Filed Vitals:   01/04/16 1009  BP: 132/80  Pulse: 81  Temp: 98.2 F (36.8 C)  TempSrc: Oral   Resp: 16  Weight: 224 lb 12.8 oz (101.969 kg)  SpO2: 97%   Body mass index is 37.41 kg/(m^2).   Physical Exam  Constitutional: Patient is obese and well-nourished. In no acute distress. HEENT:  - Head: Normocephalic and atraumatic.  - Ears: Left and Right TM grey no fluid.  - Nose: Nasal mucosa boggy and congested.  - Mouth/Throat: Oropharynx is moist with slight erythema of bilateral tonsils without hypertrophy or exudates. Post nasal drainage present.  - Eyes: Conjunctivae clear, EOM movements normal. PERRLA. No scleral icterus.  Neck: Normal range of motion. Neck supple. No JVD present. No thyromegaly present. No local lymphadenopathy. Cardiovascular: Regular rate, regular rhythm with no murmurs heard.  Pulmonary/Chest: Effort normal and breath sounds clear in all lung fields.  Musculoskeletal: Normal range of motion bilateral UE and LE, no joint effusions. Skin: Skin is warm and dry. No rash noted. Psychiatric: Patient has a normal mood and affect. Behavior is normal in office today. Judgment and thought content normal in office today.  Diabetic Foot Exam - Simple   Simple Foot Form  Diabetic Foot exam was performed with the following findings:  Yes 01/04/2016 10:56 AM  Visual Inspection  No deformities, no ulcerations, no other skin breakdown bilaterally:  Yes  Sensation Testing  Intact to touch and monofilament testing bilaterally:  Yes  Pulse Check  Posterior Tibialis and Dorsalis pulse intact bilaterally:  Yes  Comments  Dry skin       Results for orders placed or performed in visit on 01/04/16 (from the past 24 hour(s))  POCT HgB A1C     Status: Normal   Collection Time: 01/04/16 10:21 AM  Result Value Ref Range   Hemoglobin A1C 6.3   POCT UA - Microalbumin     Status: Normal   Collection Time: 01/04/16 10:22 AM  Result Value Ref Range   Microalbumin Ur, POC 0 mg/L   Creatinine, POC  mg/dL   Albumin/Creatinine Ratio, Urine, POC       Assessment &  Plan  1. Controlled type 2 diabetes mellitus without complication, without long-term current use of insulin ([place]) Well controled, I gave him an Rx last time for testing supplies he never got them. He doesn't need to test if his sugars continue to be well controled on low dose of metformin.  Patient's Hba1c goal is <6.5% for stringent control.  Patient's Hba1c goal is <7% is acceptable.  Reviewed diet, exercise, lifestyle changes and current medication regimen pertaining to diabetes with the patient.   Reminded patient of the required annual dilated retinal exam.   - POCT UA - Microalbumin - POCT HgB A1C - CBC with Differential/Platelet - Comprehensive metabolic panel - Lipid panel - TSH - metFORMIN (GLUCOPHAGE) 500 MG tablet; Take 1 tablet (500 mg total) by mouth daily.  Dispense: 90 tablet; Refill: 1  2. Hypertension goal BP (blood pressure) < 140/90 Well controled.  - CBC with Differential/Platelet - Comprehensive metabolic panel - Lipid panel - TSH - lisinopril (PRINIVIL,ZESTRIL) 20 MG tablet; Take 1 tablet (20 mg total) by mouth daily.  Dispense: 90 tablet; Refill: 1  3. Needs flu shot  - Flu Vaccine QUAD 36+ mos PF IM (Fluarix & Fluzone Quad PF)  4. Obesity, Class II, BMI 35-39.9, with comorbidity ([place]) The patient has been counseled on their higher than normal BMI.  They have verbally expressed understanding their increased risk for other diseases.  In efforts to meet a better target BMI goal the patient has been counseled on lifestyle, diet and exercise modification tactics. Start with moderate intensity aerobic exercise (walking, jogging, elliptical, swimming, group or individual sports, hiking) at least 61mns a day at least 4 days a week and increase intensity, duration, frequency as tolerated. Diet should include well balance fresh fruits and vegetables avoiding processed foods, carbohydrates and sugars. Drink at least 8oz 10 glasses a day avoiding sodas, sugary fruit  drinks, sweetened tea. Check weight on a reliable scale daily and monitor weight loss progress daily. Consider investing in mobile phone apps that will help keep track of weight loss goals.  - TSH  5. Screening for STD (sexually transmitted disease)  - HIV antibody - Hepatitis C antibody  6. Encounter for cholesteral screening for cardiovascular disease  - Lipid panel  7. Upper respiratory infection Likely viral, continue symptomatic treatment, get nasal saline spray.

## 2016-01-04 NOTE — Patient Instructions (Signed)
1) Nasal Saline spray over the counter for nose congestion.

## 2016-02-02 ENCOUNTER — Telehealth: Payer: Self-pay | Admitting: Family Medicine

## 2016-02-02 ENCOUNTER — Other Ambulatory Visit: Payer: Self-pay

## 2016-02-02 DIAGNOSIS — E119 Type 2 diabetes mellitus without complications: Secondary | ICD-10-CM

## 2016-02-02 DIAGNOSIS — I10 Essential (primary) hypertension: Secondary | ICD-10-CM

## 2016-02-02 MED ORDER — LISINOPRIL 20 MG PO TABS: 20.0000 mg | ORAL_TABLET | Freq: Every day | ORAL | Status: DC

## 2016-02-02 MED ORDER — METFORMIN HCL 500 MG PO TABS: 500.0000 mg | ORAL_TABLET | Freq: Every day | ORAL | Status: DC

## 2016-02-02 NOTE — Telephone Encounter (Signed)
Please send metformin and lisinopril refill to [email or web address]

## 2016-02-03 NOTE — Telephone Encounter (Signed)
Tried to inform patient that prescription is at pharmacy and that he need to schedule appointment.

## 2016-04-03 ENCOUNTER — Ambulatory Visit: Payer: BLUE CROSS/BLUE SHIELD | Admitting: Family Medicine

## 2016-04-13 ENCOUNTER — Ambulatory Visit: Payer: BLUE CROSS/BLUE SHIELD | Admitting: Family Medicine

## 2016-04-24 ENCOUNTER — Telehealth: Payer: Self-pay | Admitting: Family Medicine

## 2016-04-24 DIAGNOSIS — E119 Type 2 diabetes mellitus without complications: Secondary | ICD-10-CM

## 2016-04-24 DIAGNOSIS — I10 Essential (primary) hypertension: Secondary | ICD-10-CM

## 2016-04-24 NOTE — Telephone Encounter (Signed)
[facility]; there are no liver functions or kidney function test results in our chart If he never had the labs done, please ask him to go now; we need to see those numbers to know if safe to refill medicines Dr. Carlynn PurlSowles prescribed 90 day supplies of these meds (metformin and ACE-I) on 02/02/16 so it looks like he should be okay until next appt with me on June 21st I really want to see lab results prior to allowing more meds (need to make sure kidney function is okay); our motto is medicine is "first do no harm"

## 2016-04-24 NOTE — Telephone Encounter (Signed)
Pt needs refills on Metformin and bp meds to be [facility] [place]. Pt has an appt 05/03/2016

## 2016-04-25 NOTE — Telephone Encounter (Signed)
Pt phone number not working

## 2016-05-03 ENCOUNTER — Encounter: Payer: Self-pay | Admitting: Family Medicine

## 2016-05-03 ENCOUNTER — Ambulatory Visit (INDEPENDENT_AMBULATORY_CARE_PROVIDER_SITE_OTHER): Payer: Self-pay | Admitting: Family Medicine

## 2016-05-03 VITALS — BP 138/90 | HR 69 | Temp 97.6°F | Resp 14 | Wt 226.0 lb

## 2016-05-03 DIAGNOSIS — IMO0001 Reserved for inherently not codable concepts without codable children: Secondary | ICD-10-CM

## 2016-05-03 DIAGNOSIS — I10 Essential (primary) hypertension: Secondary | ICD-10-CM

## 2016-05-03 DIAGNOSIS — Z5181 Encounter for therapeutic drug level monitoring: Secondary | ICD-10-CM | POA: Insufficient documentation

## 2016-05-03 DIAGNOSIS — E119 Type 2 diabetes mellitus without complications: Secondary | ICD-10-CM

## 2016-05-03 DIAGNOSIS — B353 Tinea pedis: Secondary | ICD-10-CM

## 2016-05-03 LAB — COMPLETE METABOLIC PANEL WITH GFR
ALK PHOS: 80 U/L (ref 40–115)
ALT: 25 U/L (ref 9–46)
AST: 27 U/L (ref 10–40)
Albumin: 4.1 g/dL (ref 3.6–5.1)
BILIRUBIN TOTAL: 0.4 mg/dL (ref 0.2–1.2)
BUN: 10 mg/dL (ref 7–25)
CO2: 27 mmol/L (ref 20–31)
Calcium: 9.5 mg/dL (ref 8.6–10.3)
Chloride: 103 mmol/L (ref 98–110)
Creat: 0.87 mg/dL (ref 0.60–1.35)
GFR, Est African American: >89 mL/min (ref >=60)
Glucose, Bld: 102 mg/dL — ABNORMAL HIGH (ref 65–99)
POTASSIUM: 4.7 mmol/L (ref 3.5–5.3)
SODIUM: 141 mmol/L (ref 135–146)
Total Protein: 7.5 g/dL (ref 6.1–8.1)

## 2016-05-03 LAB — LIPID PANEL
CHOLESTEROL: 143 mg/dL (ref 125–200)
HDL: 25 mg/dL — ABNORMAL LOW (ref >=40)
LDL Cholesterol: 74 mg/dL (ref <130)
TRIGLYCERIDES: 220 mg/dL — AB (ref <150)
Total CHOL/HDL Ratio: 5.7 Ratio — ABNORMAL HIGH (ref <=5.0)
VLDL: 44 mg/dL — AB (ref <30)

## 2016-05-03 MED ORDER — LISINOPRIL 20 MG PO TABS: 20.0000 mg | ORAL_TABLET | Freq: Every day | ORAL | Status: DC

## 2016-05-03 MED ORDER — METFORMIN HCL 500 MG PO TABS: 500.0000 mg | ORAL_TABLET | Freq: Every day | ORAL | Status: DC

## 2016-05-03 NOTE — Assessment & Plan Note (Addendum)
Try DASH guidelines; see after visit summary; weight loss; med

## 2016-05-03 NOTE — Assessment & Plan Note (Signed)
Has lost 8 pounds in 5 months; keep it up

## 2016-05-03 NOTE — Patient Instructions (Signed)
Please do see your eye doctor regularly, and have your eyes examined every year (or more often per his or her recommendation) Check your feet every night and let me know right away of any sores, infections, numbness, etc. Try to limit sweets, white bread, white rice, white potatoes It is okay with me for you to not check your fingerstick blood sugars (per [employer] Best Practices), unless you are interested and feel it would be helpful for you  Your goal blood pressure is less than 130 mmHg on top. Try to follow the DASH guidelines (DASH stands for Dietary Approaches to Stop Hypertension) Try to limit the sodium in your diet.  Ideally, consume less than 1.5 grams (less than 1,500mg ) per day. Do not add salt when cooking or at the table.  Check the sodium amount on labels when shopping, and choose items lower in sodium when given a choice. Avoid or limit foods that already contain a lot of sodium. Eat a diet rich in fruits and vegetables and whole grains.  Try topical lotrimin or lotrisone cream on the feet for athletes foot

## 2016-05-03 NOTE — Assessment & Plan Note (Signed)
>>  ASSESSMENT AND PLAN FOR CONTROLLED TYPE 2 DIABETES MELLITUS WITHOUT COMPLICATION, WITHOUT LONG-TERM CURRENT USE OF INSULIN (HCC) WRITTEN ON 05/03/2016 11:10 AM BY LADA, MELINDA P, MD  Check A1c and urine microalbumin; limit whites

## 2016-05-03 NOTE — Progress Notes (Signed)
BP 138/90   Pulse 69   Temp 97.6 F (36.4 C) (Oral)   Resp 14   Wt 226 lb (102.5 kg)   SpO2 96%   BMI 37.61 kg/m    Subjective:    Patient ID: Nathaniel Webb, male    DOB: 12/29/1965, 50 y.o.   MRN: [id number]  HPI: Nathaniel Webb is a 50 y.o. male  Chief Complaint  Patient presents with  . Medication Refill   Patient is new to me; his previous provider left this practice  Type 2 diabetes; likes sweets Not checking sugars Wheat bread; used to eat white bread Not many french fries; diet drinks, no regular soft drninks  HTN; checks BP at home, good 120s at home; not much salt; uses DASH  Obesity; losing weight; doing walking; drinking water  Depression screen [facility] 2/9 05/03/2016 01/04/2016 06/09/2015  Decreased Interest 0 0 0  Down, Depressed, Hopeless 0 0 0  PHQ - 2 Score 0 0 0   Relevant past medical, surgical, family and social history reviewed Past Medical History:  Diagnosis Date  . Diabetes mellitus without complication (HCC)   . Hypertension    History reviewed. No pertinent surgical history. Family History  Problem Relation Age of Onset  . Diabetes Father    Social History  Substance Use Topics  . Smoking status: Former Games developermoker  . Smokeless tobacco: Not on file  . Alcohol use No   Interim medical history since last visit reviewed. Allergies and medications reviewed  Review of Systems Per HPI unless specifically indicated above     Objective:    BP 138/90   Pulse 69   Temp 97.6 F (36.4 C) (Oral)   Resp 14   Wt 226 lb (102.5 kg)   SpO2 96%   BMI 37.61 kg/m   Wt Readings from Last 3 Encounters:  05/03/16 226 lb (102.5 kg)  01/04/16 224 lb 12.8 oz (102 kg)  11/24/15 234 lb (106.1 kg)    Physical Exam  Constitutional: He appears well-developed and well-nourished. No distress.  obese  HENT:  Head: Normocephalic and atraumatic.  Eyes: EOM are normal. No scleral icterus.  Neck: No thyromegaly present.  Cardiovascular: Normal rate and  regular rhythm.   Pulmonary/Chest: Effort normal and breath sounds normal.  Abdominal: Soft. Bowel sounds are normal. He exhibits no distension.  Musculoskeletal: He exhibits no edema.  Neurological: He is alert.  Skin: Skin is warm and dry. No pallor.  Mild erythema and scale of feet; consistent with athlete's foot infection  Psychiatric: He has a normal mood and affect. His behavior is normal. Judgment and thought content normal.    Diabetic Foot Form - Detailed   Diabetic Foot Exam - detailed Diabetic Foot exam was performed with the following findings:  Yes 05/03/2016 11:17 AM  Visual Foot Exam completed.:  Yes  Are the toenails long?:  No Are the toenails thick?:  No Are the toenails ingrown?:  No Normal Range of Motion:  Yes Pulse Foot Exam completed.:  Yes  Right Dorsalis Pedis:  Present Left Dorsalis Pedis:  Present  Sensory Foot Exam Completed.:  Yes Swelling:  No Semmes-Weinstein Monofilament Test R Site 1-Great Toe:  Pos L Site 1-Great Toe:  Pos  R Site 4:  Pos L Site 4:  Pos  R Site 5:  Pos L Site 5:  Pos          Assessment & Plan:   Problem List Items Addressed This Visit  Cardiovascular and Mediastinum   Hypertension goal BP (blood pressure) < 130/80 - Primary    Try DASH guidelines      Relevant Medications   aspirin EC 81 MG tablet   lisinopril (PRINIVIL,ZESTRIL) 20 MG tablet     Endocrine   Controlled type 2 diabetes mellitus without complication, without long-term current use of insulin (HCC)    Check A1c and urine microalbumin; limit whites      Relevant Medications   aspirin EC 81 MG tablet   metFORMIN (GLUCOPHAGE) 500 MG tablet   lisinopril (PRINIVIL,ZESTRIL) 20 MG tablet   Other Relevant Orders   Lipid panel (Completed)   Hemoglobin A1c (Completed)   Microalbumin / creatinine urine ratio (Completed)     Musculoskeletal and Integument   Tinea pedis    Suggested topical antifungal        Other   Obesity, Class II, BMI 35-39.9,  with comorbidity (HCC)    Has lost 8 pounds in 5 months; keep it up      Relevant Medications   metFORMIN (GLUCOPHAGE) 500 MG tablet   Medication monitoring encounter   Relevant Orders   COMPLETE METABOLIC PANEL WITH GFR    Other Visit Diagnoses    Hypertension goal BP (blood pressure) < 140/90       Relevant Medications   aspirin EC 81 MG tablet   lisinopril (PRINIVIL,ZESTRIL) 20 MG tablet      Follow up plan: Return in about 6 months (around 11/02/2016) for labs and visit if diabetes controlled; return in 3 months if A1c is 7 or higher.  An after-visit summary was printed and given to the patient at check-out.  Please see the patient instructions which may contain other information and recommendations beyond what is mentioned above in the assessment and plan.  Meds ordered this encounter  Medications  . aspirin EC 81 MG tablet    Sig: Take 1 tablet (81 mg total) by mouth daily.  . metFORMIN (GLUCOPHAGE) 500 MG tablet    Sig: Take 1 tablet (500 mg total) by mouth daily. (best if taking 15-20 minutes before biggest meal of the day)    Dispense:  90 tablet    Refill:  1  . lisinopril (PRINIVIL,ZESTRIL) 20 MG tablet    Sig: Take 1 tablet (20 mg total) by mouth daily.    Dispense:  90 tablet    Refill:  0    Orders Placed This Encounter  Procedures  . Lipid panel  . COMPLETE METABOLIC PANEL WITH GFR  . Hemoglobin A1c  . Microalbumin / creatinine urine ratio  . COMPLETE METABOLIC PANEL WITH GFR

## 2016-05-03 NOTE — Assessment & Plan Note (Signed)
Check A1c and urine microalbumin; limit whites

## 2016-05-04 LAB — MICROALBUMIN / CREATININE URINE RATIO
CREATININE, URINE: 96 mg/dL (ref 20–370)
MICROALB UR: 0.2 mg/dL
MICROALB/CREAT RATIO: 2 ug/mg{creat} (ref <30)

## 2016-05-04 LAB — HEMOGLOBIN A1C
Hgb A1c MFr Bld: 6.1 % — ABNORMAL HIGH (ref <5.7)
Mean Plasma Glucose: 128 mg/dL

## 2016-05-09 ENCOUNTER — Other Ambulatory Visit: Payer: Self-pay | Admitting: Family Medicine

## 2016-05-09 DIAGNOSIS — E786 Lipoprotein deficiency: Secondary | ICD-10-CM

## 2016-05-09 DIAGNOSIS — E781 Pure hyperglyceridemia: Secondary | ICD-10-CM

## 2016-05-09 HISTORY — DX: Lipoprotein deficiency: E78.6

## 2016-05-09 HISTORY — DX: Pure hyperglyceridemia: E78.1

## 2016-06-04 DIAGNOSIS — B353 Tinea pedis: Secondary | ICD-10-CM | POA: Insufficient documentation

## 2016-06-04 NOTE — Assessment & Plan Note (Signed)
Suggested topical antifungal

## 2016-09-05 ENCOUNTER — Telehealth: Payer: Self-pay | Admitting: Family Medicine

## 2016-09-05 NOTE — Telephone Encounter (Signed)
Pt needs refill on Metformin to be sent to [facility] in [place]. °

## 2016-09-08 ENCOUNTER — Ambulatory Visit: Payer: Self-pay | Admitting: Podiatry

## 2016-10-30 ENCOUNTER — Encounter: Payer: Self-pay | Admitting: Emergency Medicine

## 2016-10-30 ENCOUNTER — Emergency Department
Admission: EM | Admit: 2016-10-30 | Discharge: 2016-10-30 | Disposition: A | Discharge status: Home / Self Care | Payer: BLUE CROSS/BLUE SHIELD | Attending: Emergency Medicine | Admitting: Emergency Medicine

## 2016-10-30 DIAGNOSIS — W450XXA Nail entering through skin, initial encounter: Secondary | ICD-10-CM | POA: Insufficient documentation

## 2016-10-30 DIAGNOSIS — S91331A Puncture wound without foreign body, right foot, initial encounter: Secondary | ICD-10-CM | POA: Diagnosis not present

## 2016-10-30 DIAGNOSIS — E119 Type 2 diabetes mellitus without complications: Secondary | ICD-10-CM | POA: Diagnosis not present

## 2016-10-30 DIAGNOSIS — I10 Essential (primary) hypertension: Secondary | ICD-10-CM | POA: Insufficient documentation

## 2016-10-30 DIAGNOSIS — Z23 Encounter for immunization: Secondary | ICD-10-CM | POA: Diagnosis not present

## 2016-10-30 DIAGNOSIS — Z79899 Other long term (current) drug therapy: Secondary | ICD-10-CM | POA: Insufficient documentation

## 2016-10-30 DIAGNOSIS — Y929 Unspecified place or not applicable: Secondary | ICD-10-CM | POA: Insufficient documentation

## 2016-10-30 DIAGNOSIS — Z87891 Personal history of nicotine dependence: Secondary | ICD-10-CM | POA: Diagnosis not present

## 2016-10-30 DIAGNOSIS — Y939 Activity, unspecified: Secondary | ICD-10-CM | POA: Insufficient documentation

## 2016-10-30 DIAGNOSIS — Y999 Unspecified external cause status: Secondary | ICD-10-CM | POA: Insufficient documentation

## 2016-10-30 DIAGNOSIS — S99921A Unspecified injury of right foot, initial encounter: Secondary | ICD-10-CM | POA: Diagnosis present

## 2016-10-30 DIAGNOSIS — Z7982 Long term (current) use of aspirin: Secondary | ICD-10-CM | POA: Diagnosis not present

## 2016-10-30 DIAGNOSIS — Z7984 Long term (current) use of oral hypoglycemic drugs: Secondary | ICD-10-CM | POA: Insufficient documentation

## 2016-10-30 LAB — GLUCOSE, CAPILLARY: GLUCOSE-CAPILLARY: 124 mg/dL — AB (ref 65–99)

## 2016-10-30 MED ORDER — TETANUS-DIPHTH-ACELL PERTUSSIS 5-2.5-18.5 LF-MCG/0.5 IM SUSP
0.5000 mL | Freq: Once | INTRAMUSCULAR | Status: AC
  Administered 2016-10-30: 0.5 mL via INTRAMUSCULAR
  Filled 2016-10-30: qty 0.5

## 2016-10-30 MED ORDER — NAPROXEN 500 MG PO TABS
500.0000 mg | ORAL_TABLET | Freq: Once | ORAL | Status: AC
  Administered 2016-10-30: 500 mg via ORAL
  Filled 2016-10-30: qty 1

## 2016-10-30 MED ORDER — SULFAMETHOXAZOLE-TRIMETHOPRIM 800-160 MG PO TABS
1.0000 | ORAL_TABLET | Freq: Once | ORAL | Status: AC
  Administered 2016-10-30: 1 via ORAL
  Filled 2016-10-30: qty 1

## 2016-10-30 MED ORDER — NAPROXEN 500 MG PO TABS: 500.0000 mg | ORAL_TABLET | Freq: Two times a day (BID) | ORAL | Status: DC

## 2016-10-30 MED ORDER — SULFAMETHOXAZOLE-TRIMETHOPRIM 800-160 MG PO TABS: 1.0000 | ORAL_TABLET | Freq: Two times a day (BID) | ORAL | 0 refills | Status: DC

## 2016-10-30 NOTE — Discharge Instructions (Signed)
Take medication as directed and soak foot twice a day for 5-10 minutes  for 3 days.

## 2016-10-30 NOTE — ED Provider Notes (Signed)
[facility] Emergency Department Provider Note   ____________________________________________   None    (approximate)  I have reviewed the triage vital signs and the nursing notes.   HISTORY  Chief Complaint Puncture Wound  HPI Nathaniel Webb is a 50 y.o. male Patient complaining of pain, redness, swelling to the right foot. Patient stated one week ago he stepped down from a ladder and a nail punctured his right foot. Patient also states his tetanus shot is not up-to-date. Patient has a history of diabetic peripheral neuropathy. Patient rates his pain as a 4/10. Patient had a pain as "achy". No palliative measures taken for nail puncture.    Past Medical History:  Diagnosis Date  . Diabetes mellitus without complication (HCC)   . Hypertension     Patient Active Problem List   Diagnosis Date Noted  . Tinea pedis 06/04/2016  . Low HDL (under 40) 05/09/2016  . High triglycerides 05/09/2016  . Medication monitoring encounter 05/03/2016  . Controlled type 2 diabetes mellitus without complication, without long-term current use of insulin (HCC) 01/04/2016  . Needs flu shot 01/04/2016  . Obesity, Class II, BMI 35-39.9, with comorbidity (HCC) 01/04/2016  . Screening for STD (sexually transmitted disease) 01/04/2016  . Encounter for cholesteral screening for cardiovascular disease 01/04/2016  . AD (atopic dermatitis) 06/09/2015  . Chronic LBP 06/09/2015  . Heel pain 06/09/2015  . Hypertension goal BP (blood pressure) < 130/80 06/09/2015  . Type 2 diabetes, controlled, with peripheral neuropathy (HCC) 12/23/2014    History reviewed. No pertinent surgical history.  Prior to Admission medications   Medication Sig Start Date End Date Taking? Authorizing Provider  aspirin EC 81 MG tablet Take 1 tablet (81 mg total) by mouth daily. 05/03/16   Kerman PasseyMelinda P Lada, MD  lisinopril (PRINIVIL,ZESTRIL) 20 MG tablet Take 1 tablet (20 mg total) by mouth daily. 05/03/16    Kerman PasseyMelinda P Lada, MD  metFORMIN (GLUCOPHAGE) 500 MG tablet Take 1 tablet (500 mg total) by mouth daily. (best if taking 15-20 minutes before biggest meal of the day) 05/03/16   Kerman PasseyMelinda P Lada, MD  naproxen (NAPROSYN) 500 MG tablet Take 1 tablet (500 mg total) by mouth 2 (two) times daily with a meal. 10/30/16   Joni Reiningonald K Smith, PA-C  sulfamethoxazole-trimethoprim (BACTRIM DS,SEPTRA DS) 800-160 MG tablet Take 1 tablet by mouth 2 (two) times daily. 10/30/16   Joni Reiningonald K Smith, PA-C    Allergies Patient has no known allergies.  Family History  Problem Relation Age of Onset  . Diabetes Father     Social History Social History  Substance Use Topics  . Smoking status: Former Games developermoker  . Smokeless tobacco: Never Used  . Alcohol use No    Review of Systems Constitutional: No fever/chills Eyes: No visual changes. ENT: No sore throat. Cardiovascular: Denies chest pain. Respiratory: Denies shortness of breath. Gastrointestinal: No abdominal pain.  No nausea, no vomiting.  No diarrhea.  No constipation. Genitourinary: Negative for dysuria. Musculoskeletal: Negative for back pain. Skin: Negative for rash. Neurological: Negative for headaches, focal weakness or numbness. Endocrine:Hypertension, hyperlipidemia, and diabetes. ____________________________________________   PHYSICAL EXAM:  VITAL SIGNS: ED Triage Vitals  Enc Vitals Group     BP 10/30/16 1436 131/81     Pulse Rate 10/30/16 1436 72     Resp 10/30/16 1436 18     Temp 10/30/16 1436 97.6 F (36.4 C)     Temp Source 10/30/16 1436 Oral     SpO2 10/30/16 1436 98 %  Weight 10/30/16 1440 222 lb (100.7 kg)     Height 10/30/16 1440 5\' 5"  (1.651 m)     Head Circumference --      Peak Flow --      Pain Score 10/30/16 1440 4     Pain Loc --      Pain Edu? --      Excl. in GC? --     Constitutional: Alert and oriented. Well appearing and in no acute distress. Eyes: Conjunctivae are normal. PERRL. EOMI. Head: Atraumatic. Nose:  No congestion/rhinnorhea. Mouth/Throat: Mucous membranes are moist.  Oropharynx non-erythematous. Neck: No stridor.  No cervical spine tenderness to palpation. Hematological/Lymphatic/Immunilogical: No cervical lymphadenopathy. Cardiovascular: Normal rate, regular rhythm. Grossly normal heart sounds.  Good peripheral circulation. Respiratory: Normal respiratory effort.  No retractions. Lungs CTAB. Gastrointestinal: Soft and nontender. No distention. No abdominal bruits. No CVA tenderness. Musculoskeletal: No lower extremity tenderness nor edema.  No joint effusions. Neurologic:  Normal speech and language. No gross focal neurologic deficits are appreciated. No gait instability. Skin: Visible puncture wound to the lateral aspect of the right foot. Mild edema and erythema dose aspect of the foot. Psychiatric: Mood and affect are normal. Speech and behavior are normal.  ____________________________________________   LABS (all labs ordered are listed, but only abnormal results are displayed)  Labs Reviewed  GLUCOSE, CAPILLARY - Abnormal; Notable for the following:       Result Value   Glucose-Capillary 124 (*)    All other components within normal limits   ____________________________________________  EKG   ____________________________________________  RADIOLOGY   ____________________________________________   PROCEDURES  Procedure(s) performed: None  Procedures  Critical Care performed: No  ____________________________________________   INITIAL IMPRESSION / ASSESSMENT AND PLAN / ED COURSE  Pertinent labs & imaging results that were available during my care of the patient were reviewed by me and considered in my medical decision making (see chart for details).  Skin infection secondary to puncture wound plantar aspect the right foot. Patient given discharge care instructions. Patient given a prescription for naproxen and Bactrim DS. Patient advised follow-up family  doctor's condition persists.  Clinical Course   Patient given tetanus prior to departure.   ____________________________________________   FINAL CLINICAL IMPRESSION(S) / ED DIAGNOSES  Final diagnoses:  Puncture wound to foot, right, initial encounter      NEW MEDICATIONS STARTED DURING THIS VISIT:  New Prescriptions   NAPROXEN (NAPROSYN) 500 MG TABLET    Take 1 tablet (500 mg total) by mouth 2 (two) times daily with a meal.   SULFAMETHOXAZOLE-TRIMETHOPRIM (BACTRIM DS,SEPTRA DS) 800-160 MG TABLET    Take 1 tablet by mouth 2 (two) times daily.     Note:  This document was prepared using Dragon voice recognition software and may include unintentional dictation errors.    Joni ReiningRonald K Smith, PA-C 10/30/16 1550    Sharman CheekPhillip Stafford, MD 10/31/16 [phone]

## 2016-10-30 NOTE — ED Triage Notes (Signed)
Patient states approximately one week ago, he stepped down from a ladder and stepped on a small nail with his right foot. Puncture wound is noted on lateral side of right foot. Patient states he has now noticed that his right leg is swelling. Patient has a history of diabetes.

## 2016-11-02 ENCOUNTER — Ambulatory Visit: Payer: BLUE CROSS/BLUE SHIELD | Admitting: Family Medicine

## 2016-11-07 ENCOUNTER — Other Ambulatory Visit: Payer: Self-pay | Admitting: Family Medicine

## 2016-11-07 DIAGNOSIS — E119 Type 2 diabetes mellitus without complications: Secondary | ICD-10-CM

## 2016-11-09 ENCOUNTER — Encounter: Payer: Self-pay | Admitting: Family Medicine

## 2016-11-09 ENCOUNTER — Other Ambulatory Visit: Payer: Self-pay | Admitting: Family Medicine

## 2016-11-09 ENCOUNTER — Other Ambulatory Visit: Payer: Self-pay

## 2016-11-09 ENCOUNTER — Ambulatory Visit (INDEPENDENT_AMBULATORY_CARE_PROVIDER_SITE_OTHER): Payer: BLUE CROSS/BLUE SHIELD | Admitting: Family Medicine

## 2016-11-09 VITALS — BP 124/80 | HR 71 | Temp 97.2°F | Resp 16 | Wt 232.0 lb

## 2016-11-09 DIAGNOSIS — E669 Obesity, unspecified: Secondary | ICD-10-CM

## 2016-11-09 DIAGNOSIS — R252 Cramp and spasm: Secondary | ICD-10-CM | POA: Insufficient documentation

## 2016-11-09 DIAGNOSIS — I10 Essential (primary) hypertension: Secondary | ICD-10-CM

## 2016-11-09 DIAGNOSIS — E119 Type 2 diabetes mellitus without complications: Secondary | ICD-10-CM

## 2016-11-09 DIAGNOSIS — Z23 Encounter for immunization: Secondary | ICD-10-CM | POA: Diagnosis not present

## 2016-11-09 DIAGNOSIS — S91331D Puncture wound without foreign body, right foot, subsequent encounter: Secondary | ICD-10-CM

## 2016-11-09 DIAGNOSIS — E1142 Type 2 diabetes mellitus with diabetic polyneuropathy: Secondary | ICD-10-CM | POA: Diagnosis not present

## 2016-11-09 DIAGNOSIS — IMO0001 Reserved for inherently not codable concepts without codable children: Secondary | ICD-10-CM

## 2016-11-09 DIAGNOSIS — E786 Lipoprotein deficiency: Secondary | ICD-10-CM | POA: Diagnosis not present

## 2016-11-09 DIAGNOSIS — Z5181 Encounter for therapeutic drug level monitoring: Secondary | ICD-10-CM | POA: Diagnosis not present

## 2016-11-09 HISTORY — DX: Cramp and spasm: R25.2

## 2016-11-09 MED ORDER — METFORMIN HCL 500 MG PO TABS: 500.0000 mg | ORAL_TABLET | Freq: Every day | ORAL | 1 refills | Status: DC

## 2016-11-09 NOTE — Telephone Encounter (Signed)
rx sent

## 2016-11-09 NOTE — Assessment & Plan Note (Signed)
Check A1c today; avoid sugary drinks; foot exam by MD here; pt to check his feet every night

## 2016-11-09 NOTE — Telephone Encounter (Signed)
Blank note 

## 2016-11-09 NOTE — Patient Instructions (Signed)
Let's check labs today Check out the information at familydoctor.org entitled "Nutrition for Weight Loss: What You Need to Know about Fad Diets" Try to lose between 1-2 pounds per week by taking in fewer calories and burning off more calories You can succeed by limiting portions, limiting foods dense in calories and fat, becoming more active, and drinking 8 glasses of water a day (64 ounces) Don't skip meals, especially breakfast, as skipping meals may alter your metabolism Do not use over-the-counter weight loss pills or gimmicks that claim rapid weight loss A healthy BMI (or body mass index) is between 18.5 and 24.9 You can calculate your ideal BMI at the NIH website JobEconomics.huhttp://www.nhlbi.nih.gov/health/educational/lose_wt/BMI/bmicalc.htm Try to limit saturated fats in your diet (bologna, hot dogs, barbeque, cheeseburgers, hamburgers, steak, bacon, sausage, cheese, etc.) and get more fresh fruits, vegetables, and whole grains Please do see your eye doctor regularly, and have your eyes examined every year (or more often per his or her recommendation) Check your feet every night and let me know right away of any sores, infections, numbness, etc. Try to limit sweets, white bread, white rice, white potatoes It is okay with me for you to not check your fingerstick blood sugars (per [employer] Best Practices), unless you are interested and feel it would be helpful for you

## 2016-11-09 NOTE — Telephone Encounter (Signed)
Pt went to [facility] to pick up the metformin prescription and they do not have it. Please send in prescription.

## 2016-11-09 NOTE — Assessment & Plan Note (Signed)
Encouraged weight loss; goal of 190 pounds over the next year set by patient, little bit by little bit

## 2016-11-09 NOTE — Progress Notes (Signed)
BP 124/80   Pulse 71   Temp 97.2 F (36.2 C) (Oral)   Resp 16   Wt 232 lb (105.2 kg)   SpO2 97%   BMI 38.61 kg/m    Subjective:    Patient ID: Nathaniel Webb, male    DOB: 04/08/1966, 50 y.o.   MRN: [id number]  HPI: Nathaniel Webb is a 50 y.o. male  Chief Complaint  Patient presents with  . Follow-up  . Diabetes   He stepped on a nail about two weeks ago; then he went to the ER and they started him on antibiotics; it had swelled up but the swelling is better now; had some fevers, but gone now; on tmp/smx ds  Type 2 diabetes; on metformin; had it about a year; used to get dry mouth but not any more; used to get blurred vision, but not any more; drinks diet drinks; cranberry juice and orange juice for cramps  Hypertension; good control today; taking ACE-I; no complaint of cough  Obesity; gained some weight over the holidays  [facility] 11/09/2016 05/03/2016 01/04/2016 06/09/2015  Decreased Interest 0 0 0 0  Down, Depressed, Hopeless 0 0 0 0  PHQ - 2 Score 0 0 0 0   Relevant past medical, surgical, family and social history reviewed Past Medical History:  Diagnosis Date  . Diabetes mellitus without complication (HCC)   . High triglycerides 05/09/2016  . Hypertension   . Low HDL (under 40) 05/09/2016  . Obesity, Class II, BMI 35-39.9, with comorbidity (HCC) 01/04/2016   History reviewed. No pertinent surgical history.   Family History  Problem Relation Age of Onset  . Diabetes Father    Social History  Substance Use Topics  . Smoking status: Former Games developermoker  . Smokeless tobacco: Never Used  . Alcohol use No   Interim medical history since last visit reviewed. Allergies and medications reviewed  Review of Systems  Respiratory: Negative for wheezing.   Cardiovascular: Negative for chest pain.  some hand cramps Per HPI unless specifically indicated above     Objective:    BP 124/80   Pulse 71   Temp 97.2 F (36.2 C) (Oral)   Resp 16   Wt  232 lb (105.2 kg)   SpO2 97%   BMI 38.61 kg/m   Wt Readings from Last 3 Encounters:  11/09/16 232 lb (105.2 kg)  10/30/16 222 lb (100.7 kg)  05/03/16 226 lb (102.5 kg)    Physical Exam  Constitutional: He appears well-developed and well-nourished. No distress.  obese  HENT:  Head: Normocephalic and atraumatic.  Eyes: EOM are normal. No scleral icterus.  Neck: No thyromegaly present.  Cardiovascular: Normal rate and regular rhythm.   Pulmonary/Chest: Effort normal and breath sounds normal.  Abdominal: Soft. Bowel sounds are normal. He exhibits no distension.  Musculoskeletal: He exhibits no edema.  Neurological: He is alert.  Skin: Skin is warm and dry. No pallor.  Tiny lesion on foot consistent with previous puncture site; no fluctuance, no erythema, no drainage  Psychiatric: He has a normal mood and affect. His behavior is normal. Judgment and thought content normal. His mood appears not anxious. He does not exhibit a depressed mood.   Diabetic Foot Form - Detailed   Diabetic Foot Exam - detailed Diabetic Foot exam was performed with the following findings:  Yes 11/18/2016 10:51 AM  Visual Foot Exam completed.:  Yes  Are the toenails ingrown?:  No Normal Range of Motion:  Yes  Pulse Foot Exam completed.:  Yes  Right Dorsalis Pedis:  Present Left Dorsalis Pedis:  Present  Sensory Foot Exam Completed.:  Yes Semmes-Weinstein Monofilament Test R Site 1-Great Toe:  Pos L Site 1-Great Toe:  Pos  R Site 4:  Pos L Site 4:  Pos  R Site 5:  Pos L Site 5:  Pos          Assessment & Plan:   Problem List Items Addressed This Visit      Cardiovascular and Mediastinum   Hypertension goal BP (blood pressure) < 130/80    Excellent control of BP; continue to limit salt        Endocrine   Type 2 diabetes, controlled, with peripheral neuropathy (HCC) - Primary    Check A1c today; avoid sugary drinks; foot exam by MD here; pt to check his feet every night      Relevant Orders    Hemoglobin A1c (Completed)   Microalbumin / creatinine urine ratio (Completed)     Other   Obesity, Class II, BMI 35-39.9, with comorbidity (HCC)    Encouraged weight loss; goal of 190 pounds over the next year set by patient, little bit by little bit      Needs flu shot   Relevant Orders   Flu Vaccine QUAD 36+ mos PF IM (Fluarix & Fluzone Quad PF) (Completed)   Medication monitoring encounter    Check labs today      Relevant Orders   COMPLETE METABOLIC PANEL WITH GFR (Completed)   Low HDL (under 40)    Check lipids today; work on weight loss      Relevant Orders   Lipid panel (Completed)   Hand cramps   Relevant Orders   Magnesium (Completed)    Other Visit Diagnoses    Puncture wound of right foot, subsequent encounter       continue antibiotics; appears to be healing without incident; check feet nightly      Follow up plan: Return in about 3 months (around 02/07/2017) for weight management.  An after-visit summary was printed and given to the patient at check-out.  Please see the patient instructions which may contain other information and recommendations beyond what is mentioned above in the assessment and plan.  No orders of the defined types were placed in this encounter.   Orders Placed This Encounter  Procedures  . Flu Vaccine QUAD 36+ mos PF IM (Fluarix & Fluzone Quad PF)  . Hemoglobin A1c  . Lipid panel  . COMPLETE METABOLIC PANEL WITH GFR  . Magnesium  . Microalbumin / creatinine urine ratio

## 2016-11-09 NOTE — Assessment & Plan Note (Signed)
Excellent control of BP; continue to limit salt

## 2016-11-09 NOTE — Assessment & Plan Note (Signed)
Check labs today.

## 2016-11-09 NOTE — Assessment & Plan Note (Signed)
Check lipids today; work on weight loss 

## 2016-11-10 ENCOUNTER — Telehealth: Payer: Self-pay | Admitting: Family Medicine

## 2016-11-10 ENCOUNTER — Other Ambulatory Visit: Payer: Self-pay | Admitting: Family Medicine

## 2016-11-10 ENCOUNTER — Other Ambulatory Visit: Payer: Self-pay

## 2016-11-10 ENCOUNTER — Other Ambulatory Visit
Admission: RE | Admit: 2016-11-10 | Discharge: 2016-11-10 | Disposition: A | Discharge status: Home / Self Care | Payer: BLUE CROSS/BLUE SHIELD | Source: Ambulatory Visit | Attending: Family Medicine | Admitting: Family Medicine

## 2016-11-10 DIAGNOSIS — E875 Hyperkalemia: Secondary | ICD-10-CM | POA: Insufficient documentation

## 2016-11-10 LAB — COMPLETE METABOLIC PANEL WITH GFR
ALT: 36 U/L (ref 9–46)
AST: 30 U/L (ref 10–40)
Albumin: 4.4 g/dL (ref 3.6–5.1)
Alkaline Phosphatase: 71 U/L (ref 40–115)
BUN: 18 mg/dL (ref 7–25)
CHLORIDE: 104 mmol/L (ref 98–110)
CO2: 21 mmol/L (ref 20–31)
CREATININE: 1.09 mg/dL (ref 0.60–1.35)
Calcium: 9.6 mg/dL (ref 8.6–10.3)
GFR, Est Non African American: 79 mL/min (ref >=60)
Glucose, Bld: 114 mg/dL — ABNORMAL HIGH (ref 65–99)
Potassium: 5.5 mmol/L — ABNORMAL HIGH (ref 3.5–5.3)
SODIUM: 138 mmol/L (ref 135–146)
Total Bilirubin: 0.3 mg/dL (ref 0.2–1.2)
Total Protein: 8 g/dL (ref 6.1–8.1)

## 2016-11-10 LAB — LIPID PANEL
Cholesterol: 158 mg/dL (ref <200)
HDL: 24 mg/dL — ABNORMAL LOW (ref >40)
LDL CALC: 74 mg/dL (ref <100)
TRIGLYCERIDES: 300 mg/dL — AB (ref <150)
Total CHOL/HDL Ratio: 6.6 Ratio — ABNORMAL HIGH (ref <5.0)
VLDL: 60 mg/dL — AB (ref <30)

## 2016-11-10 LAB — HEMOGLOBIN A1C
Hgb A1c MFr Bld: 6.3 % — ABNORMAL HIGH (ref <5.7)
Mean Plasma Glucose: 134 mg/dL

## 2016-11-10 LAB — POTASSIUM: POTASSIUM: 4.4 mmol/L (ref 3.5–5.1)

## 2016-11-10 LAB — MICROALBUMIN / CREATININE URINE RATIO
CREATININE, URINE: 130 mg/dL (ref 20–370)
MICROALB UR: 0.3 mg/dL
Microalb Creat Ratio: 2 mcg/mg creat (ref <30)

## 2016-11-10 LAB — MAGNESIUM: Magnesium: 2.1 mg/dL (ref 1.5–2.5)

## 2016-11-10 MED ORDER — ICOSAPENT ETHYL 1 G PO CAPS: ORAL_CAPSULE | ORAL | 5 refills | Status: DC

## 2016-11-10 NOTE — Telephone Encounter (Signed)
Potassium order placed  

## 2016-11-10 NOTE — Progress Notes (Signed)
vascepa Rx 

## 2016-11-10 NOTE — Telephone Encounter (Signed)
Pt returned call states he did find a ride to the hospital. He is needing the potassium order placed.

## 2016-11-15 ENCOUNTER — Other Ambulatory Visit: Payer: Self-pay | Admitting: Family Medicine

## 2016-11-15 MED ORDER — OMEGA-3-ACID ETHYL ESTERS 1 G PO CAPS: 2.0000 g | ORAL_CAPSULE | Freq: Two times a day (BID) | ORAL | 5 refills | Status: DC

## 2016-11-15 NOTE — Telephone Encounter (Signed)
Glitch in computer system, I was not receiving refill requests electronically; system corrected by IT yesterday; addressing refills now ----------------------------  

## 2016-11-15 NOTE — Progress Notes (Signed)
Insurance won't pay for vascepa; must try lovaza first

## 2016-11-18 ENCOUNTER — Encounter: Payer: Self-pay | Admitting: Family Medicine

## 2017-01-19 ENCOUNTER — Other Ambulatory Visit: Payer: Self-pay

## 2017-01-19 DIAGNOSIS — I10 Essential (primary) hypertension: Secondary | ICD-10-CM

## 2017-01-19 MED ORDER — LISINOPRIL 20 MG PO TABS: 20.0000 mg | ORAL_TABLET | Freq: Every day | ORAL | 0 refills | Status: DC

## 2017-01-19 NOTE — Telephone Encounter (Signed)
Last Cr reviewed; K+ recheck was normal Rx approved

## 2017-01-21 ENCOUNTER — Emergency Department: Payer: BLUE CROSS/BLUE SHIELD

## 2017-01-21 ENCOUNTER — Emergency Department
Admission: EM | Admit: 2017-01-21 | Discharge: 2017-01-21 | Disposition: A | Discharge status: Home / Self Care | Payer: BLUE CROSS/BLUE SHIELD | Attending: Emergency Medicine | Admitting: Emergency Medicine

## 2017-01-21 ENCOUNTER — Encounter: Payer: Self-pay | Admitting: Emergency Medicine

## 2017-01-21 DIAGNOSIS — M1711 Unilateral primary osteoarthritis, right knee: Secondary | ICD-10-CM | POA: Insufficient documentation

## 2017-01-21 DIAGNOSIS — E119 Type 2 diabetes mellitus without complications: Secondary | ICD-10-CM | POA: Diagnosis not present

## 2017-01-21 DIAGNOSIS — Z87891 Personal history of nicotine dependence: Secondary | ICD-10-CM | POA: Diagnosis not present

## 2017-01-21 DIAGNOSIS — Z7982 Long term (current) use of aspirin: Secondary | ICD-10-CM | POA: Insufficient documentation

## 2017-01-21 DIAGNOSIS — Z79899 Other long term (current) drug therapy: Secondary | ICD-10-CM | POA: Diagnosis not present

## 2017-01-21 DIAGNOSIS — M25561 Pain in right knee: Secondary | ICD-10-CM

## 2017-01-21 DIAGNOSIS — I10 Essential (primary) hypertension: Secondary | ICD-10-CM | POA: Diagnosis not present

## 2017-01-21 MED ORDER — DICLOFENAC SODIUM 50 MG PO TBEC: 50.0000 mg | DELAYED_RELEASE_TABLET | Freq: Three times a day (TID) | ORAL | 1 refills | Status: DC

## 2017-01-21 MED ORDER — DICLOFENAC SODIUM 50 MG PO TBEC
50.0000 mg | DELAYED_RELEASE_TABLET | Freq: Once | ORAL | Status: AC
  Administered 2017-01-21: 50 mg via ORAL
  Filled 2017-01-21: qty 1

## 2017-01-21 NOTE — Discharge Instructions (Signed)
Your knee pain may be due to early, mild arthritis. You should take the prescription anti-inflammatory as directed. Rest with the leg elevated when seated. Apply ice to reduce swelling. Follow-up with Dr. Sherie DonLada for ongoing symptoms.

## 2017-01-21 NOTE — ED Triage Notes (Signed)
Pt c/o swelling and pain to both knees at this time. 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 Pt states worse on the R side at this time. Pt c/o increased pain with walking at this time.

## 2017-01-21 NOTE — ED Provider Notes (Signed)
[facility] Emergency Department Provider Note ____________________________________________  Time seen: 1626  I have reviewed the triage vital signs and the nursing notes.  HISTORY  Chief Complaint  Knee Pain  HPI Nathaniel Webb is a 51 y.o. male with a  History of HTN and DM presents to the ED for evaluation of right greater than left knee pain. Patient reports pain, swelling, and disability to the knees.He describes tightness to the right knee over the last several days. He denies any known injury, trauma, or accident. He denies any chronic or ongoing knee problems. 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  Past Medical History:  Diagnosis Date  . Diabetes mellitus without complication (HCC)   . High triglycerides 05/09/2016  . Hypertension   . Low HDL (under 40) 05/09/2016  . Obesity, Class II, BMI 35-39.9, with comorbidity (HCC) 01/04/2016    Patient Active Problem List   Diagnosis Date Noted  . Hand cramps 11/09/2016  . Tinea pedis 06/04/2016  . Low HDL (under 40) 05/09/2016  . High triglycerides 05/09/2016  . Medication monitoring encounter 05/03/2016  . Controlled type 2 diabetes mellitus without complication, without long-term current use of insulin (HCC) 01/04/2016  . Needs flu shot 01/04/2016  . Obesity, Class II, BMI 35-39.9, with comorbidity (HCC) 01/04/2016  . Screening for STD (sexually transmitted disease) 01/04/2016  . Encounter for cholesteral screening for cardiovascular disease 01/04/2016  . AD (atopic dermatitis) 06/09/2015  . Chronic LBP 06/09/2015  . Heel pain 06/09/2015  . Hypertension goal BP (blood pressure) < 130/80 06/09/2015  . Type 2 diabetes, controlled, with peripheral neuropathy (HCC) 12/23/2014    History reviewed. No pertinent surgical history.  Prior to Admission medications   Medication Sig Start Date End Date Taking? Authorizing Provider  aspirin EC 81 MG tablet Take  1 tablet (81 mg total) by mouth daily. 05/03/16   Kerman PasseyMelinda P Lada, MD  diclofenac (VOLTAREN) 50 MG EC tablet Take 1 tablet (50 mg total) by mouth 3 (three) times daily. 01/21/17   Laryssa Hassing V Bacon Janiyla Long, PA-C  lisinopril (PRINIVIL,ZESTRIL) 20 MG tablet Take 1 tablet (20 mg total) by mouth daily. 01/19/17   Kerman PasseyMelinda P Lada, MD  metFORMIN (GLUCOPHAGE) 500 MG tablet Take 1 tablet (500 mg total) by mouth daily. (best if taking 15-20 minutes before biggest meal of the day) 11/09/16   Kerman PasseyMelinda P Lada, MD  naproxen (NAPROSYN) 500 MG tablet Take 1 tablet (500 mg total) by mouth 2 (two) times daily with a meal. 10/30/16   Joni Reiningonald K Smith, PA-C  omega-3 acid ethyl esters (LOVAZA) 1 g capsule Take 2 capsules (2 g total) by mouth 2 (two) times daily. (this replaces your fish oil) 11/15/16   Kerman PasseyMelinda P Lada, MD  sulfamethoxazole-trimethoprim (BACTRIM DS,SEPTRA DS) 800-160 MG tablet Take 1 tablet by mouth 2 (two) times daily. 10/30/16   Joni Reiningonald K Smith, PA-C    Allergies Patient has no known allergies.  Family History  Problem Relation Age of Onset  . Diabetes Father     Social History Social History  Substance Use Topics  . Smoking status: Former Games developermoker  . Smokeless tobacco: Never Used  . Alcohol use No    Review of Systems  Constitutional: Negative for fever. Cardiovascular: Negative for chest pain. Respiratory: Negative for shortness of breath. Musculoskeletal: Negative for back pain. Bilateral knee pain as above. Skin: Negative for rash. Neurological: Negative for headaches, focal weakness or numbness. ____________________________________________  PHYSICAL  EXAM:  VITAL SIGNS: ED Triage Vitals  Enc Vitals Group     BP 01/21/17 1714 (!) 150/82     Pulse Rate 01/21/17 1714 67     Resp 01/21/17 1714 18     Temp 01/21/17 1714 98.2 F (36.8 C)     Temp Source 01/21/17 1714 Oral     SpO2 01/21/17 1714 98 %     Weight 01/21/17 1715 234 lb (106.1 kg)     Height 01/21/17 1715 5\' 5"  (1.651 m)      Head Circumference --      Peak Flow --      Pain Score 01/21/17 1715 6     Pain Loc --      Pain Edu? --      Excl. in GC? --     Constitutional: Alert and oriented. Well appearing and in no distress. Head: Normocephalic and atraumatic. Cardiovascular: Normal rate, regular rhythm. Normal distal pulses. Respiratory: Normal respiratory effort. No wheezes/rales/rhonchi. Musculoskeletal: Right knee without obvious deformity, dislocation, or effusion. Patient were normal patellar tracking without ballottement. Patient with a mild effusion to the right knee noted. No valgus or varus joint stress is appreciated. No popliteal space fullness is noted. Patient with normal flexion and extension range on exam. No valgus or varus joint stress is noted. Nontender with normal range of motion in all extremities.  Neurologic:  Normal gait without ataxia. Normal speech and language. No gross focal neurologic deficits are appreciated. Skin:  Skin is warm, dry and intact. No rash noted. ____________________________________________   RADIOLOGY  Right Knee IMPRESSION: Minimal degenerative changes with small joint effusion.  I, Reyaansh Merlo, Charlesetta Ivory, personally viewed and evaluated these images (plain radiographs) as part of my medical decision making, as well as reviewing the written report by the radiologist. ____________________________________________  PROCEDURES  Voltaren 50 mg PO ____________________________________________  INITIAL IMPRESSION / ASSESSMENT AND PLAN / ED COURSE  Patient with right knee pain likely due to mild DJD. He has a small effusion, but exam is negative for any internal derangement. He will be discharged with a prescription for Voltaren 50 mg to dose daily. He will follow-up with his PCP for ongoing symptoms. He is further advised to consider using compression (athletic) socks at work.  ____________________________________________  FINAL CLINICAL IMPRESSION(S) / ED  DIAGNOSES  Final diagnoses:  Acute pain of right knee  Primary osteoarthritis of right knee      Lissa Hoard, PA-C 01/21/17 1831    Governor Rooks, MD 01/21/17 2038

## 2017-01-21 NOTE — ED Notes (Signed)
Pt verbalized understanding of discharge instructions. NAD at this time. 

## 2017-02-08 ENCOUNTER — Ambulatory Visit: Payer: BLUE CROSS/BLUE SHIELD | Admitting: Family Medicine

## 2017-02-15 ENCOUNTER — Telehealth: Payer: Self-pay

## 2017-02-15 ENCOUNTER — Ambulatory Visit (INDEPENDENT_AMBULATORY_CARE_PROVIDER_SITE_OTHER): Payer: BLUE CROSS/BLUE SHIELD | Admitting: Family Medicine

## 2017-02-15 ENCOUNTER — Encounter: Payer: Self-pay | Admitting: Family Medicine

## 2017-02-15 DIAGNOSIS — M1711 Unilateral primary osteoarthritis, right knee: Secondary | ICD-10-CM | POA: Diagnosis not present

## 2017-02-15 DIAGNOSIS — E781 Pure hyperglyceridemia: Secondary | ICD-10-CM | POA: Diagnosis not present

## 2017-02-15 DIAGNOSIS — E786 Lipoprotein deficiency: Secondary | ICD-10-CM

## 2017-02-15 DIAGNOSIS — E119 Type 2 diabetes mellitus without complications: Secondary | ICD-10-CM | POA: Diagnosis not present

## 2017-02-15 LAB — LIPID PANEL
CHOLESTEROL: 163 mg/dL (ref <200)
HDL: 23 mg/dL — ABNORMAL LOW (ref >40)
LDL Cholesterol: 105 mg/dL — ABNORMAL HIGH (ref <100)
Total CHOL/HDL Ratio: 7.1 Ratio — ABNORMAL HIGH (ref <5.0)
Triglycerides: 175 mg/dL — ABNORMAL HIGH (ref <150)
VLDL: 35 mg/dL — AB (ref <30)

## 2017-02-15 MED ORDER — DICLOFENAC SODIUM 1 % TD GEL: 4.0000 g | Freq: Four times a day (QID) | TRANSDERMAL | 2 refills | Status: DC

## 2017-02-15 MED ORDER — BAYER CONTOUR MONITOR DEVI: 0 refills | Status: DC

## 2017-02-15 MED ORDER — ACCU-CHEK SOFT TOUCH LANCETS MISC: 1 refills | Status: DC

## 2017-02-15 MED ORDER — BLOOD GLUC METER DISP-STRIPS DEVI
0 refills | Status: DC
Start: 2017-02-15 — End: 2019-11-10

## 2017-02-15 NOTE — Assessment & Plan Note (Signed)
Will skip the xrays; believe this is OA; will try topical NSAID

## 2017-02-15 NOTE — Assessment & Plan Note (Signed)
Work on weight loss, increase walking and weight loss

## 2017-02-15 NOTE — Assessment & Plan Note (Signed)
Work on weight loss.

## 2017-02-15 NOTE — Assessment & Plan Note (Signed)
Foot exam by MD; microalb:Cr UTD; return for A1c in 3 months

## 2017-02-15 NOTE — Patient Instructions (Addendum)
Check out the information at familydoctor.org entitled "Nutrition for Weight Loss: What You Need to Know about Fad Diets" Try to lose between 1-2 pounds per week by taking in fewer calories and burning off more calories You can succeed by limiting portions, limiting foods dense in calories and fat, becoming more active, and drinking 8 glasses of water a day (64 ounces) Don't skip meals, especially breakfast, as skipping meals may alter your metabolism Do not use over-the-counter weight loss pills or gimmicks that claim rapid weight loss A healthy BMI (or body mass index) is between 18.5 and 24.9 You can calculate your ideal BMI at the NIH website JobEconomics.hu  Try to lose 10 pounds over the next 3 months Use the gel over the right knee as directed if needed Plain tylenol per package directions We'll get labs today

## 2017-02-15 NOTE — Assessment & Plan Note (Signed)
>>  ASSESSMENT AND PLAN FOR CONTROLLED TYPE 2 DIABETES MELLITUS WITHOUT COMPLICATION, WITHOUT LONG-TERM CURRENT USE OF INSULIN (HCC) WRITTEN ON 02/15/2017  9:17 AM BY LADA, MELINDA P, MD  Foot exam by MD; microalb:Cr UTD; return for A1c in 3 months

## 2017-02-15 NOTE — Telephone Encounter (Signed)
Patient would like rx for contour 1 test meter, strips and lancets

## 2017-02-15 NOTE — Progress Notes (Signed)
BP 140/80   Pulse 75   Temp 97.6 F (36.4 C) (Oral)   Wt 233 lb 9.6 oz (106 kg)   SpO2 95%   BMI 38.87 kg/m    Subjective:    Patient ID: Nathaniel Webb, male    DOB: 1965-12-13, 51 y.o.   MRN: [id number]  HPI: ALDAHIR LITAKER is a 51 y.o. male  Chief Complaint  Patient presents with  . Follow-up    weight management.   He is here for follow-up Weight management for obesity; no real weight loss, 1/4 pound Eating late at night; trying to eat better Getting more active Drinking enough water Diet soft drinks; some Kickstarters zero calories Not many fatty meats or eggs Skip breakfast  RIGHT knee pain, medial aspect; hurts worse when standing and walking a lot  Type 2 diabetes mellitus No dry mouth Lab Results  Component Value Date   HGBA1C 6.3 (H) 11/09/2016   High cholesterol Limiting fatty meat and eggs On lovaza  Depression screen [facility] 2/9 02/15/2017 11/09/2016 05/03/2016 01/04/2016 06/09/2015  Decreased Interest 0 0 0 0 0  Down, Depressed, Hopeless 0 0 0 0 0  PHQ - 2 Score 0 0 0 0 0    Relevant past medical, surgical, family and social history reviewed Past Medical History:  Diagnosis Date  . Diabetes mellitus without complication (HCC)   . High triglycerides 05/09/2016  . Hypertension   . Low HDL (under 40) 05/09/2016  . Obesity, Class II, BMI 35-39.9, with comorbidity (HCC) 01/04/2016   History reviewed. No pertinent surgical history. Family History  Problem Relation Age of Onset  . Diabetes Father   . COPD Mother    Social History  Substance Use Topics  . Smoking status: Former Games developer  . Smokeless tobacco: Never Used  . Alcohol use No   Interim medical history since last visit reviewed. Allergies and medications reviewed  Review of Systems  Constitutional: Negative for unexpected weight change.  Cardiovascular: Positive for leg swelling (when walking). Negative for chest pain.   Per HPI unless specifically indicated above     Objective:    BP 140/80   Pulse 75   Temp 97.6 F (36.4 C) (Oral)   Wt 233 lb 9.6 oz (106 kg)   SpO2 95%   BMI 38.87 kg/m   Wt Readings from Last 3 Encounters:  02/15/17 233 lb 9.6 oz (106 kg)  01/21/17 234 lb (106.1 kg)  11/09/16 232 lb (105.2 kg)    Physical Exam  Constitutional: He appears well-developed and well-nourished. No distress.  obese  HENT:  Head: Normocephalic and atraumatic.  Eyes: EOM are normal. No scleral icterus.  Neck: No thyromegaly present.  Cardiovascular: Normal rate and regular rhythm.   Pulmonary/Chest: Effort normal and breath sounds normal.  Abdominal: Soft. Bowel sounds are normal. He exhibits no distension.  Musculoskeletal: He exhibits no edema.       Right knee: He exhibits decreased range of motion and swelling. He exhibits no effusion and no deformity. Tenderness found. Medial joint line tenderness noted.  Neurological: Coordination normal.  Skin: Skin is warm and dry. No pallor.  Psychiatric: He has a normal mood and affect. His behavior is normal. Judgment and thought content normal.    Diabetic Foot Form - Detailed   Diabetic Foot Exam - detailed Diabetic Foot exam was performed with the following findings:  Yes 02/15/2017  9:14 AM  Visual Foot Exam completed.:  Yes  Are the toenails ingrown?:  No  Normal Range of Motion:  Yes Pulse Foot Exam completed.:  Yes  Right Dorsalis Pedis:  Present Left Dorsalis Pedis:  Present  Sensory Foot Exam Completed.:  Yes Swelling:  No Semmes-Weinstein Monofilament Test R Site 1-Great Toe:  Pos L Site 1-Great Toe:  Pos  R Site 4:  Pos L Site 4:  Pos  R Site 5:  Pos L Site 5:  Pos         Results for orders placed or performed during the hospital encounter of 11/10/16  Potassium  Result Value Ref Range   Potassium 4.4 3.5 - 5.1 mmol/L      Assessment & Plan:   Problem List Items Addressed This Visit      Endocrine   Controlled type 2 diabetes mellitus without complication, without long-term current use  of insulin (HCC)    Foot exam by MD; microalb:Cr UTD; return for A1c in 3 months        Musculoskeletal and Integument   Osteoarthritis of right knee    Will skip the xrays; believe this is OA; will try topical NSAID        Other   Morbid obesity (HCC)    Work on weight loss      Low HDL (under 40)    Work on weight loss, increase walking and weight loss      Relevant Orders   Lipid panel   High triglycerides    Continue Rx; check labs today; weight loss; avoid fried foods and starches      Relevant Orders   Lipid panel       Follow up plan: Return in about 3 months (around 05/17/2017) for twenty minute follow-up with fasting labs.  An after-visit summary was printed and given to the patient at check-out.  Please see the patient instructions which may contain other information and recommendations beyond what is mentioned above in the assessment and plan.  Meds ordered this encounter  Medications  . diclofenac sodium (VOLTAREN) 1 % GEL    Sig: Apply 4 g topically 4 (four) times daily.    Dispense:  100 g    Refill:  2    Orders Placed This Encounter  Procedures  . Lipid panel

## 2017-02-15 NOTE — Assessment & Plan Note (Signed)
Continue Rx; check labs today; weight loss; avoid fried foods and starches

## 2017-02-21 ENCOUNTER — Other Ambulatory Visit: Payer: Self-pay | Admitting: Family Medicine

## 2017-02-21 MED ORDER — ATORVASTATIN CALCIUM 10 MG PO TABS: 10.0000 mg | ORAL_TABLET | Freq: Every day | ORAL | 2 refills | Status: DC

## 2017-02-21 NOTE — Progress Notes (Signed)
Add statin Recheck labs at July appt fasting

## 2017-04-20 ENCOUNTER — Other Ambulatory Visit: Payer: Self-pay | Admitting: Family Medicine

## 2017-04-20 DIAGNOSIS — I10 Essential (primary) hypertension: Secondary | ICD-10-CM

## 2017-04-23 NOTE — Telephone Encounter (Signed)
Creatinine and recheck K+ normal; Rx approved

## 2017-05-17 ENCOUNTER — Ambulatory Visit: Payer: BLUE CROSS/BLUE SHIELD | Admitting: Family Medicine

## 2017-09-19 DIAGNOSIS — I059 Rheumatic mitral valve disease, unspecified: Secondary | ICD-10-CM | POA: Insufficient documentation

## 2017-09-19 DIAGNOSIS — I369 Nonrheumatic tricuspid valve disorder, unspecified: Secondary | ICD-10-CM | POA: Insufficient documentation

## 2017-10-18 ENCOUNTER — Other Ambulatory Visit: Payer: Self-pay | Admitting: Family Medicine

## 2017-10-18 DIAGNOSIS — I10 Essential (primary) hypertension: Secondary | ICD-10-CM

## 2017-10-18 NOTE — Telephone Encounter (Signed)
LVM for pt to call and schedule an appt °

## 2017-10-18 NOTE — Telephone Encounter (Signed)
Patient was due for an appt and fasting labs in July It is now December Please ask him to schedule an appt and come fasting as soon as convenient I'll give limited amount of Rx, but we need to monitor his K+ and kidneys on this medicine

## 2017-11-01 ENCOUNTER — Other Ambulatory Visit: Payer: Self-pay | Admitting: Family Medicine

## 2017-11-01 DIAGNOSIS — I10 Essential (primary) hypertension: Secondary | ICD-10-CM

## 2017-11-01 NOTE — Telephone Encounter (Signed)
Please see last phone note Patient needs an appointment and labs I am providing 7 pills; no more until patient is seen and has blood drawn Thank you

## 2017-11-01 NOTE — Telephone Encounter (Signed)
LVM for pt to call and schedule an appt °

## 2017-12-29 NOTE — Telephone Encounter (Signed)
Signing off on old note from 11/01/17

## 2017-12-29 NOTE — Telephone Encounter (Signed)
Signing off on old note from 10/18/17

## 2017-12-31 ENCOUNTER — Ambulatory Visit: Payer: Self-pay | Admitting: Pharmacy Technician

## 2017-12-31 ENCOUNTER — Encounter (INDEPENDENT_AMBULATORY_CARE_PROVIDER_SITE_OTHER): Payer: Self-pay

## 2017-12-31 DIAGNOSIS — Z79899 Other long term (current) drug therapy: Secondary | ICD-10-CM

## 2017-12-31 NOTE — Progress Notes (Signed)
Completed [facility] application and contract.  Patient agreed to all terms of the [facility] contract.    Contacted [employer] while patient was present to verbally verify that insurance coverage ended on 11/12/17.  Patient approved to receive medication assistance from [facility] through 2019, as long as eligibility continues to be met.    Provided patient with Civil engineer, contracting based on his particular needs.    Pt stated that he is uninsured cannot afford to pay for office visits with Dr. Wolfgang Phoenix.  Referred patient to [facility].  Pharmacy Tech at [facility] to transfer prescriptions for Lisinopril & Metformin from [place].  Atorvastatin will be transferred from [facility]  Patien to ask Dr. Wolfgang Phoenix to send prescription for aspirin to [facility].  [facility]

## 2018-01-05 ENCOUNTER — Emergency Department
Admission: EM | Admit: 2018-01-05 | Discharge: 2018-01-05 | Disposition: A | Discharge status: Home / Self Care | Payer: Self-pay | Attending: Emergency Medicine | Admitting: Emergency Medicine

## 2018-01-05 ENCOUNTER — Other Ambulatory Visit: Payer: Self-pay

## 2018-01-05 DIAGNOSIS — Z79899 Other long term (current) drug therapy: Secondary | ICD-10-CM | POA: Insufficient documentation

## 2018-01-05 DIAGNOSIS — R05 Cough: Secondary | ICD-10-CM | POA: Insufficient documentation

## 2018-01-05 DIAGNOSIS — Z7982 Long term (current) use of aspirin: Secondary | ICD-10-CM | POA: Insufficient documentation

## 2018-01-05 DIAGNOSIS — I10 Essential (primary) hypertension: Secondary | ICD-10-CM | POA: Insufficient documentation

## 2018-01-05 DIAGNOSIS — J09X2 Influenza due to identified novel influenza A virus with other respiratory manifestations: Secondary | ICD-10-CM | POA: Insufficient documentation

## 2018-01-05 DIAGNOSIS — Z20828 Contact with and (suspected) exposure to other viral communicable diseases: Secondary | ICD-10-CM | POA: Insufficient documentation

## 2018-01-05 DIAGNOSIS — Z87891 Personal history of nicotine dependence: Secondary | ICD-10-CM | POA: Insufficient documentation

## 2018-01-05 DIAGNOSIS — J111 Influenza due to unidentified influenza virus with other respiratory manifestations: Secondary | ICD-10-CM

## 2018-01-05 DIAGNOSIS — Z7984 Long term (current) use of oral hypoglycemic drugs: Secondary | ICD-10-CM | POA: Insufficient documentation

## 2018-01-05 DIAGNOSIS — E119 Type 2 diabetes mellitus without complications: Secondary | ICD-10-CM | POA: Insufficient documentation

## 2018-01-05 LAB — INFLUENZA PANEL BY PCR (TYPE A & B)
Influenza A By PCR: POSITIVE — AB
Influenza B By PCR: NEGATIVE

## 2018-01-05 MED ORDER — FLUTICASONE PROPIONATE 50 MCG/ACT NA SUSP: 2.0000 | Freq: Every day | NASAL | 0 refills | Status: DC

## 2018-01-05 MED ORDER — OSELTAMIVIR PHOSPHATE 75 MG PO CAPS: 75.0000 mg | ORAL_CAPSULE | Freq: Two times a day (BID) | ORAL | 0 refills | Status: AC

## 2018-01-05 MED ORDER — PSEUDOEPH-BROMPHEN-DM 30-2-10 MG/5ML PO SYRP: 5.0000 mL | ORAL_SOLUTION | Freq: Four times a day (QID) | ORAL | 0 refills | Status: DC | PRN

## 2018-01-05 MED ORDER — BENZONATATE 100 MG PO CAPS: 100.0000 mg | ORAL_CAPSULE | Freq: Three times a day (TID) | ORAL | 0 refills | Status: DC | PRN

## 2018-01-05 NOTE — ED Provider Notes (Signed)
[facility] Emergency Department Provider Note  ____________________________________________  Time seen: Approximately 3:25 PM  I have reviewed the triage vital signs and the nursing notes.   HISTORY  Chief Complaint URI    HPI Nathaniel Webb is a 52 y.o. male that presents to the emergency department for evaluation of sinus congestion for 3 days.  He has an occasional nonproductive cough.  Patient has not checked his temperature but has felt warm.  He does not smoke.  His partner's son has influenza and would like to be tested for that.  No shortness of breath, chest pain, nausea, vomiting, abdominal pain, diarrhea, constipation.   Past Medical History:  Diagnosis Date  . Diabetes mellitus without complication (HCC)   . High triglycerides 05/09/2016  . Hypertension   . Low HDL (under 40) 05/09/2016  . Obesity, Class II, BMI 35-39.9, with comorbidity (HCC) 01/04/2016    Patient Active Problem List   Diagnosis Date Noted  . Osteoarthritis of right knee 02/15/2017  . Hand cramps 11/09/2016  . Tinea pedis 06/04/2016  . Low HDL (under 40) 05/09/2016  . High triglycerides 05/09/2016  . Medication monitoring encounter 05/03/2016  . Controlled type 2 diabetes mellitus without complication, without long-term current use of insulin (HCC) 01/04/2016  . Needs flu shot 01/04/2016  . Morbid obesity (HCC) 01/04/2016  . Screening for STD (sexually transmitted disease) 01/04/2016  . Encounter for cholesteral screening for cardiovascular disease 01/04/2016  . AD (atopic dermatitis) 06/09/2015  . Chronic LBP 06/09/2015  . Heel pain 06/09/2015  . Hypertension goal BP (blood pressure) < 130/80 06/09/2015  . Type 2 diabetes, controlled, with peripheral neuropathy (HCC) 12/23/2014    History reviewed. No pertinent surgical history.  Prior to Admission medications   Medication Sig Start Date End Date Taking? Authorizing Provider  aspirin EC 81 MG tablet Take 1 tablet  (81 mg total) by mouth daily. 05/03/16   Kerman Passey, MD  atorvastatin (LIPITOR) 10 MG tablet Take 1 tablet (10 mg total) by mouth at bedtime. 02/21/17   Lada, Janit Bern, MD  benzonatate (TESSALON PERLES) 100 MG capsule Take 1 capsule (100 mg total) by mouth 3 (three) times daily as needed for cough. 01/05/18 01/05/19  Enid Derry, PA-C  Blood Gluc Meter Disp-Strips (BLOOD GLUCOSE METER DISPOSABLE) DEVI For Contour Check FSBS once a day on average, dx E11.9, LON 99 months 02/15/17   Lada, Janit Bern, MD  Blood Glucose Monitoring Suppl (CONTOUR BLOOD GLUCOSE SYSTEM) DEVI Check FSBS once a day on average, dx E11.9, LON 99 months 02/15/17   Kerman Passey, MD  diclofenac (VOLTAREN) 50 MG EC tablet Take 1 tablet (50 mg total) by mouth 3 (three) times daily. 01/21/17   Menshew, Charlesetta Ivory, PA-C  diclofenac sodium (VOLTAREN) 1 % GEL Apply 4 g topically 4 (four) times daily. 02/15/17   Kerman Passey, MD  fluticasone (FLONASE) 50 MCG/ACT nasal spray Place 2 sprays into both nostrils daily. 01/05/18 01/05/19  Enid Derry, PA-C  Lancets (ACCU-CHEK SOFT [facility]) lancets Check FSBS once a day on average, dx E11.9, LON 99 months 02/15/17   Lada, Janit Bern, MD  lisinopril (PRINIVIL,ZESTRIL) 20 MG tablet TAKE 1 TABLET BY MOUTH DAILY 11/01/17   Lada, Janit Bern, MD  metFORMIN (GLUCOPHAGE) 500 MG tablet Take 1 tablet (500 mg total) by mouth daily. (best if taking 15-20 minutes before biggest meal of the day) 11/09/16   Lada, Janit Bern, MD  omega-3 acid ethyl esters (LOVAZA) 1 g  capsule Take 2 capsules (2 g total) by mouth 2 (two) times daily. (this replaces your fish oil) 11/15/16   Lada, Janit BernMelinda P, MD  oseltamivir (TAMIFLU) 75 MG capsule Take 1 capsule (75 mg total) by mouth 2 (two) times daily for 5 days. 01/05/18 01/10/18  Enid DerryWagner, Emanie Behan, PA-C    Allergies Patient has no known allergies.  Family History  Problem Relation Age of Onset  . Diabetes Father   . COPD Mother     Social History Social History    Tobacco Use  . Smoking status: Former Games developermoker  . Smokeless tobacco: Never Used  Substance Use Topics  . Alcohol use: No    Alcohol/week: 0.0 oz  . Drug use: No     Review of Systems  Eyes: No visual changes. No discharge. ENT: Positive for congestion and rhinorrhea. Cardiovascular: No chest pain. Respiratory: Positive for cough. No SOB. Gastrointestinal: No abdominal pain.  No nausea, no vomiting.  No diarrhea.  No constipation. Musculoskeletal: Negative for musculoskeletal pain. Skin: Negative for rash, abrasions, lacerations, ecchymosis. Neurological: Negative for headaches.   ____________________________________________   PHYSICAL EXAM:  VITAL SIGNS: ED Triage Vitals  Enc Vitals Group     BP 01/05/18 1350 138/79     Pulse Rate 01/05/18 1350 87     Resp 01/05/18 1350 18     Temp 01/05/18 1350 98.8 F (37.1 C)     Temp Source 01/05/18 1350 Oral     SpO2 01/05/18 1350 96 %     Weight 01/05/18 1351 234 lb (106.1 kg)     Height 01/05/18 1351 5\' 5"  (1.651 m)     Head Circumference --      Peak Flow --      Pain Score 01/05/18 1355 6     Pain Loc --      Pain Edu? --      Excl. in GC? --      Constitutional: Alert and oriented. Well appearing and in no acute distress. Eyes: Conjunctivae are normal. PERRL. EOMI. No discharge. Head: Atraumatic. ENT: Maxillary sinus tenderness.       Ears: Tympanic membranes pearly gray with good landmarks. No discharge.      Nose: Mild congestion/rhinnorhea.      Mouth/Throat: Mucous membranes are moist. Oropharynx non-erythematous. Tonsils not enlarged. No exudates. Uvula midline. Neck: No stridor.   Hematological/Lymphatic/Immunilogical: No cervical lymphadenopathy. Cardiovascular: Normal rate, regular rhythm.  Good peripheral circulation. Respiratory: Normal respiratory effort without tachypnea or retractions. Lungs CTAB. Good air entry to the bases with no decreased or absent breath sounds. Gastrointestinal: Bowel sounds 4  quadrants. Soft and nontender to palpation. No guarding or rigidity. No palpable masses. No distention. Musculoskeletal: Full range of motion to all extremities. No gross deformities appreciated. Neurologic:  Normal speech and language. No gross focal neurologic deficits are appreciated.  Skin:  Skin is warm, dry and intact. No rash noted.    ____________________________________________   LABS (all labs ordered are listed, but only abnormal results are displayed)  Labs Reviewed  INFLUENZA PANEL BY PCR (TYPE A & B) - Abnormal; Notable for the following components:      Result Value   Influenza A By PCR POSITIVE (*)    All other components within normal limits   ____________________________________________  EKG   ____________________________________________  RADIOLOGY   No results found.  ____________________________________________    PROCEDURES  Procedure(s) performed:    Procedures    Medications - No data to display   ____________________________________________  INITIAL IMPRESSION / ASSESSMENT AND PLAN / ED COURSE  Pertinent labs & imaging results that were available during my care of the patient were reviewed by me and considered in my medical decision making (see chart for details).  Review of the [place] CSRS was performed in accordance of the [facility] prior to dispensing any controlled drugs.   Patient's diagnosis is consistent with influenza. Vital signs and exam are reassuring.  We discussed that symptoms have been going on for just over 48 hours so Tamiflu is unlikely to be effective.  Patient appears well and is staying well hydrated. Patient should alternate tylenol and ibuprofen for fever. Patient feels comfortable going home. Patient will be discharged home with prescriptions for Tamiflu, Flonase, Tessalon Perles. Patient is to follow up with PCP as needed or otherwise directed. Patient is given ED precautions to return to the ED for any worsening or new  symptoms.     ____________________________________________  FINAL CLINICAL IMPRESSION(S) / ED DIAGNOSES  Final diagnoses:  Influenza      NEW MEDICATIONS STARTED DURING THIS VISIT:  ED Discharge Orders        Ordered    brompheniramine-pseudoephedrine-DM (BROMFED DM) 30-2-10 MG/5ML syrup  4 times daily PRN,   Status:  Discontinued     01/05/18 1647    fluticasone (FLONASE) 50 MCG/ACT nasal spray  Daily,   Status:  Discontinued     01/05/18 1647    oseltamivir (TAMIFLU) 75 MG capsule  2 times daily     01/05/18 1647    fluticasone (FLONASE) 50 MCG/ACT nasal spray  Daily     01/05/18 1650    benzonatate (TESSALON PERLES) 100 MG capsule  3 times daily PRN     01/05/18 1650          This chart was dictated using voice recognition software/Dragon. Despite best efforts to proofread, errors can occur which can change the meaning. Any change was purely unintentional.    Enid Derry, PA-C 01/05/18 1718    Sharyn Creamer, MD 01/06/18 [id number]

## 2018-01-05 NOTE — ED Triage Notes (Signed)
Pt c/o cough with sinus and chest congestion for the past 3 days 

## 2018-01-17 ENCOUNTER — Telehealth: Payer: Self-pay | Admitting: Adult Health Nurse Practitioner

## 2018-01-17 NOTE — Telephone Encounter (Signed)
Wants to become pt, has turned info into medication management. Needs proof of residency

## 2018-01-24 ENCOUNTER — Encounter: Payer: Self-pay | Admitting: Adult Health

## 2018-01-24 ENCOUNTER — Ambulatory Visit: Payer: BLUE CROSS/BLUE SHIELD | Admitting: Adult Health

## 2018-01-24 VITALS — BP 134/79 | HR 61 | Temp 98.0°F | Wt 238.6 lb

## 2018-01-24 DIAGNOSIS — E1159 Type 2 diabetes mellitus with other circulatory complications: Secondary | ICD-10-CM

## 2018-01-24 DIAGNOSIS — E119 Type 2 diabetes mellitus without complications: Secondary | ICD-10-CM

## 2018-01-24 DIAGNOSIS — E785 Hyperlipidemia, unspecified: Secondary | ICD-10-CM

## 2018-01-24 DIAGNOSIS — E1169 Type 2 diabetes mellitus with other specified complication: Secondary | ICD-10-CM

## 2018-01-24 DIAGNOSIS — M150 Primary generalized (osteo)arthritis: Secondary | ICD-10-CM

## 2018-01-24 DIAGNOSIS — I10 Essential (primary) hypertension: Principal | ICD-10-CM

## 2018-01-24 DIAGNOSIS — E1140 Type 2 diabetes mellitus with diabetic neuropathy, unspecified: Secondary | ICD-10-CM

## 2018-01-24 DIAGNOSIS — Z0000 Encounter for general adult medical examination without abnormal findings: Secondary | ICD-10-CM

## 2018-01-24 DIAGNOSIS — M159 Polyosteoarthritis, unspecified: Secondary | ICD-10-CM

## 2018-01-24 NOTE — Progress Notes (Signed)
Patient: Nathaniel Webb Male    DOB: Jan 12, 1966   52 y.o.   MRN: [id number] Visit Date: 01/24/2018  Today's Provider: Mary Sella, NP   Chief Complaint  Patient presents with  . New Patient (Initial Visit)    check up, some tightness in knees and numbness in toes   Subjective:    HPI: This is a 53 y/o AA male who presents for an initial visit and for evaluation of bilateral knee pain, numbness in the toes, hypertension, hyperlipidemia, and T2DM. He describes bilateral knee pain was a "buckling and stiff feeling" that is worse in the morning. He can't think of any alleviating or aggravating factors. He has not tried any OTC remedies.  Also reports numbness in his toes. Denies any toe discoloration and ulcers.  He state sthat he was diagnosed with osteoarthritis at one point.  He reports adherence to medications but state that he is still struggling with his diet. He reports good sleep, denies smoking, drug and alcohol use. He did nor get a flu shot and hasn't had his PSA checked. He is due for a colonoscopy but does not have any coverage at this time.He denies any family history of colon cancer. He hasn't had a diabetic eye and foot exam over the last two years  Past Medical History:  Diagnosis Date  . Diabetes mellitus without complication ([place])   . High triglycerides 05/09/2016  . Hypertension   . Low HDL (under 40) 05/09/2016  . Obesity, Class II, BMI 35-39.9, with comorbidity ([place]) 01/04/2016  History reviewed. No pertinent surgical history.   Social History   Socioeconomic History  . Marital status: Single    Spouse name: Not on file  . Number of children: Not on file  . Years of education: Not on file  . Highest education level: Not on file  Social Needs  . Financial resource strain: Not on file  . Food insecurity - worry: Not on file  . Food insecurity - inability: Not on file  . Transportation needs - medical: Not on file  . Transportation needs - non-medical: Not on  file  Occupational History  . Not on file  Tobacco Use  . Smoking status: Former Research scientist (life sciences)  . Smokeless tobacco: Never Used  Substance and Sexual Activity  . Alcohol use: No    Alcohol/week: 0.0 oz  . Drug use: No  . Sexual activity: Yes    Partners: Female    Birth control/protection: None  Other Topics Concern  . Not on file  Social History Narrative  . Not on file    No Known Allergies Previous Medications   ASPIRIN EC 81 MG TABLET    Take 1 tablet (81 mg total) by mouth daily.   ATORVASTATIN (LIPITOR) 10 MG TABLET    Take 1 tablet (10 mg total) by mouth at bedtime.   BENZONATATE (TESSALON PERLES) 100 MG CAPSULE    Take 1 capsule (100 mg total) by mouth 3 (three) times daily as needed for cough.   BLOOD GLUC METER DISP-STRIPS (BLOOD GLUCOSE METER DISPOSABLE) DEVI    For Contour Check FSBS once a day on average, dx E11.9, LON 99 months   BLOOD GLUCOSE MONITORING SUPPL (CONTOUR BLOOD GLUCOSE SYSTEM) DEVI    Check FSBS once a day on average, dx E11.9, LON 99 months   DICLOFENAC (VOLTAREN) 50 MG EC TABLET    Take 1 tablet (50 mg total) by mouth 3 (three) times daily.   DICLOFENAC SODIUM (VOLTAREN) 1 %  GEL    Apply 4 g topically 4 (four) times daily.   FLUTICASONE (FLONASE) 50 MCG/ACT NASAL SPRAY    Place 2 sprays into both nostrils daily.   LANCETS (ACCU-CHEK SOFT TOUCH) LANCETS    Check FSBS once a day on average, dx E11.9, LON 99 months   LISINOPRIL (PRINIVIL,ZESTRIL) 20 MG TABLET    TAKE 1 TABLET BY MOUTH DAILY   METFORMIN (GLUCOPHAGE) 500 MG TABLET    Take 1 tablet (500 mg total) by mouth daily. (best if taking 15-20 minutes before biggest meal of the day)   OMEGA-3 ACID ETHYL ESTERS (LOVAZA) 1 G CAPSULE    Take 2 capsules (2 g total) by mouth 2 (two) times daily. (this replaces your fish oil)    Review of Systems  Constitutional: Negative for activity change, appetite change, chills, diaphoresis, fatigue and fever.  HENT: Negative for congestion, hearing loss, sinus pressure and  sneezing.   Eyes: Negative for photophobia, pain, redness, itching and visual disturbance.  Respiratory: Negative for apnea, cough, choking, shortness of breath and wheezing.   Cardiovascular: Negative for chest pain, palpitations and leg swelling.  Gastrointestinal: Negative for constipation, diarrhea and nausea.  Endocrine: Negative for cold intolerance, heat intolerance, polydipsia, polyphagia and polyuria.  Genitourinary: Negative for dysuria, frequency and urgency.  Musculoskeletal: Positive for arthralgias (bilateral knees). Negative for back pain.  Skin: Negative.   Allergic/Immunologic: Negative.   Neurological: Positive for numbness (Toes). Negative for dizziness, weakness and headaches.  Hematological: Negative.   Psychiatric/Behavioral: Negative.    Objective:   BP 134/79   Pulse 61   Temp 98 F (36.7 C)   Wt 238 lb 9.6 oz (108.2 kg)   BMI 39.71 kg/m   Physical Exam  Constitutional: He is oriented to person, place, and time. He appears well-developed and well-nourished.  Obese  HENT:  Head: Normocephalic.  Nose: Nose normal.  Mouth/Throat: Oropharynx is clear and moist.  Eyes: Conjunctivae and EOM are normal. Pupils are equal, round, and reactive to light.  Neck: Normal range of motion. Neck supple. No thyromegaly present.  Cardiovascular: Normal rate, regular rhythm, normal heart sounds and intact distal pulses.  Pulmonary/Chest: Effort normal and breath sounds normal. No respiratory distress. He has no wheezes. He has no rales.  Abdominal: Soft. Bowel sounds are normal. He exhibits distension (central obesity). There is no tenderness. There is no rebound.  Genitourinary:  Genitourinary Comments: Deferred per patient request  Musculoskeletal: Normal range of motion. He exhibits no edema.  No pain with palpation of both knees. Knee draw test normal, no fluid, mild crepitations with flexion ad extension  Neurological: He is alert and oriented to person, place, and  time. He has normal reflexes.  Skin: Skin is warm and dry. He is not diaphoretic.  Psychiatric: He has a normal mood and affect.      Assessment & Plan:  Hypertension associated with diabetes: [facility]): well controlled. Continue ASA and lisinopril.  Type 2 diabetes mellitus without complication, without long-term current use of insulin (HCC) - Continue metformin and home blood glucose monitoring. Patient encouraged to adhere to a low sugar diet, daily exercise x 30 minutes and good sleep hygiene. Will refer for an annual diabetic foot and eye exams  Hyperlipidemia associated with type 2 diabetes mellitus ([place]) - Continue atorvastatin. Will obtain lipid panel  Health maintenance examination: Fecal Occult Blood, Guaiac since patient cannot afford colonoscopy. Will also obtain a PSA  Primary osteoarthritis involving multiple joints: Daily exercise and tylenol as needed  Morbid obesity ([place]): low fat and low carb diet recommended. Patient has agreed to make efforts in increasing his activity and decreasing sugar and fat intake.   Type 2 diabetes mellitus with diabetic neuropathy, without long-term current use of insulin ([place]): Will ensure optimal glycemic control and re-evaluate during next visit before considering medications. Leg exercises and foot care reviewed with patient and significant other  Labs ordered: CBC w/Diff, Comp Met (CMET), PSA, Lipid Profile, TSH, HgB A1c and fecal occult blood   Mary Sella, NP   [facility]

## 2018-01-25 LAB — CBC WITH DIFFERENTIAL/PLATELET
BASOS: 0 %
Basophils Absolute: 0 10*3/uL (ref 0.0–0.2)
EOS (ABSOLUTE): 0.2 10*3/uL (ref 0.0–0.4)
EOS: 2 %
Hematocrit: 41.3 % (ref 37.5–51.0)
Hemoglobin: 13.8 g/dL (ref 13.0–17.7)
IMMATURE GRANULOCYTES: 0 %
Immature Grans (Abs): 0 10*3/uL (ref 0.0–0.1)
LYMPHS: 28 %
Lymphocytes Absolute: 2.2 10*3/uL (ref 0.7–3.1)
MCH: 26.7 pg (ref 26.6–33.0)
MCHC: 33.4 g/dL (ref 31.5–35.7)
MCV: 80 fL (ref 79–97)
Monocytes Absolute: 0.6 10*3/uL (ref 0.1–0.9)
Monocytes: 8 %
NEUTROS PCT: 62 %
Neutrophils Absolute: 4.9 10*3/uL (ref 1.4–7.0)
PLATELETS: 285 10*3/uL (ref 150–379)
RBC: 5.16 x10E6/uL (ref 4.14–5.80)
RDW: 15.4 % (ref 12.3–15.4)
WBC: 7.8 10*3/uL (ref 3.4–10.8)

## 2018-01-25 LAB — COMPREHENSIVE METABOLIC PANEL
A/G RATIO: 1.2 (ref 1.2–2.2)
ALT: 32 IU/L (ref 0–44)
AST: 32 IU/L (ref 0–40)
Albumin: 4.4 g/dL (ref 3.5–5.5)
Alkaline Phosphatase: 87 IU/L (ref 39–117)
BUN/Creatinine Ratio: 13 (ref 9–20)
BUN: 12 mg/dL (ref 6–24)
Bilirubin Total: 0.3 mg/dL (ref 0.0–1.2)
CALCIUM: 9.9 mg/dL (ref 8.7–10.2)
CO2: 24 mmol/L (ref 20–29)
CREATININE: 0.89 mg/dL (ref 0.76–1.27)
Chloride: 104 mmol/L (ref 96–106)
GFR, EST AFRICAN AMERICAN: 115 mL/min/{1.73_m2} (ref >59)
GFR, EST NON AFRICAN AMERICAN: 100 mL/min/{1.73_m2} (ref >59)
Globulin, Total: 3.7 g/dL (ref 1.5–4.5)
Glucose: 105 mg/dL — ABNORMAL HIGH (ref 65–99)
Potassium: 4.8 mmol/L (ref 3.5–5.2)
Sodium: 143 mmol/L (ref 134–144)
Total Protein: 8.1 g/dL (ref 6.0–8.5)

## 2018-01-25 LAB — LIPID PANEL
CHOL/HDL RATIO: 4 ratio (ref 0.0–5.0)
CHOLESTEROL TOTAL: 123 mg/dL (ref 100–199)
HDL: 31 mg/dL — AB (ref >39)
LDL Calculated: 67 mg/dL (ref 0–99)
TRIGLYCERIDES: 125 mg/dL (ref 0–149)
VLDL CHOLESTEROL CAL: 25 mg/dL (ref 5–40)

## 2018-01-25 LAB — HEMOGLOBIN A1C
ESTIMATED AVERAGE GLUCOSE: 143 mg/dL
Hgb A1c MFr Bld: 6.6 % — ABNORMAL HIGH (ref 4.8–5.6)

## 2018-01-25 LAB — TSH: TSH: 1.32 u[IU]/mL (ref 0.450–4.500)

## 2018-01-25 LAB — PSA: Prostate Specific Ag, Serum: 1.3 ng/mL (ref 0.0–4.0)

## 2018-01-31 ENCOUNTER — Ambulatory Visit: Payer: Self-pay | Admitting: Adult Health

## 2018-01-31 ENCOUNTER — Ambulatory Visit: Payer: Self-pay | Admitting: Ophthalmology

## 2018-02-27 ENCOUNTER — Telehealth: Payer: Self-pay

## 2018-02-27 ENCOUNTER — Ambulatory Visit: Payer: Self-pay | Admitting: Internal Medicine

## 2018-02-27 ENCOUNTER — Ambulatory Visit: Payer: Self-pay | Admitting: Ophthalmology

## 2018-02-27 NOTE — Telephone Encounter (Signed)
pts family lm wanting to r/s appts missed. Called pt x2 to try to r/s. No answer. No voicemail set up

## 2018-03-21 ENCOUNTER — Encounter: Payer: Self-pay | Admitting: Adult Health

## 2018-03-21 ENCOUNTER — Ambulatory Visit: Payer: Self-pay | Admitting: Adult Health

## 2018-03-21 VITALS — BP 138/80 | HR 62 | Temp 97.5°F | Ht 65.0 in | Wt 239.5 lb

## 2018-03-21 DIAGNOSIS — M1711 Unilateral primary osteoarthritis, right knee: Secondary | ICD-10-CM

## 2018-03-21 DIAGNOSIS — E1140 Type 2 diabetes mellitus with diabetic neuropathy, unspecified: Secondary | ICD-10-CM

## 2018-03-21 DIAGNOSIS — E1142 Type 2 diabetes mellitus with diabetic polyneuropathy: Secondary | ICD-10-CM

## 2018-03-21 DIAGNOSIS — I10 Essential (primary) hypertension: Secondary | ICD-10-CM

## 2018-03-21 DIAGNOSIS — L739 Follicular disorder, unspecified: Secondary | ICD-10-CM

## 2018-03-21 NOTE — Progress Notes (Signed)
Patient: Nathaniel Webb Male    DOB: [birth year]   52 y.o.   MRN: [id number] Visit Date: 03/21/2018  Today's Provider: Deforest Hoyles, NP   Chief Complaint  Patient presents with  . Follow-up   Subjective:    HPI Patient presents for f/u diabetes and hypertension. Still c/o right knee pain, burning/tingling feet and a rash at the back of his head. He reports occasional right knee stiffness. He is not currently taking any pain remedies. He denies any falls. He is c/o of a painful rash in the back of his head that started after he shaved. He has not tried any topical remedies. He is compliant with all medications. Denies any hypo or hyperglycemic episodes.    No Known Allergies Previous Medications   ASPIRIN EC 81 MG TABLET    Take 1 tablet (81 mg total) by mouth daily.   ATORVASTATIN (LIPITOR) 10 MG TABLET    Take 1 tablet (10 mg total) by mouth at bedtime.   BENZONATATE (TESSALON PERLES) 100 MG CAPSULE    Take 1 capsule (100 mg total) by mouth 3 (three) times daily as needed for cough.   BLOOD GLUC METER DISP-STRIPS (BLOOD GLUCOSE METER DISPOSABLE) DEVI    For Contour Check FSBS once a day on average, dx E11.9, LON 99 months   BLOOD GLUCOSE MONITORING SUPPL (CONTOUR BLOOD GLUCOSE SYSTEM) DEVI    Check FSBS once a day on average, dx E11.9, LON 99 months   DICLOFENAC (VOLTAREN) 50 MG EC TABLET    Take 1 tablet (50 mg total) by mouth 3 (three) times daily.   DICLOFENAC SODIUM (VOLTAREN) 1 % GEL    Apply 4 g topically 4 (four) times daily.   FLUTICASONE (FLONASE) 50 MCG/ACT NASAL SPRAY    Place 2 sprays into both nostrils daily.   LANCETS (ACCU-CHEK SOFT TOUCH) LANCETS    Check FSBS once a day on average, dx E11.9, LON 99 months   LISINOPRIL (PRINIVIL,ZESTRIL) 20 MG TABLET    TAKE 1 TABLET BY MOUTH DAILY   METFORMIN (GLUCOPHAGE) 500 MG TABLET    Take 1 tablet (500 mg total) by mouth daily. (best if taking 15-20 minutes before biggest meal of the day)   OMEGA-3 ACID ETHYL ESTERS  (LOVAZA) 1 G CAPSULE    Take 2 capsules (2 g total) by mouth 2 (two) times daily. (this replaces your fish oil)    Review of Systems  Constitutional: Negative.   Respiratory: Negative.   Endocrine: Negative.   Musculoskeletal: Positive for myalgias.       Bilateral leg pain; right>>> left  Skin: Positive for rash (back of head).  Neurological: Negative.     Social History   Tobacco Use  . Smoking status: Former Research scientist (life sciences)  . Smokeless tobacco: Never Used  Substance Use Topics  . Alcohol use: No    Alcohol/week: 0.0 oz   Objective:   BP 138/80   Pulse 62   Temp (!) 97.5 F (36.4 C)   Ht _0  (1.651 m)   Wt 239 lb 8 oz (108.6 kg)   BMI 39.85 kg/m   Physical Exam  Constitutional: He is oriented to person, place, and time. He appears well-developed and well-nourished.  Cardiovascular: Normal rate, regular rhythm, normal heart sounds and intact distal pulses.  Pulmonary/Chest: Effort normal and breath sounds normal.  Abdominal: Soft. Bowel sounds are normal.  Musculoskeletal: He exhibits no deformity.  Mild pain and crepitations of the right knee  Neurological: He is alert  and oriented to person, place, and time.  Skin: Skin is warm and dry. Rash (multiple small pustules in the back of the neck) noted.        Assessment & Plan:  1. Essential hypertension Well-controlled.  Continue current medications  2. Type 2 diabetes, controlled, with peripheral neuropathy ([place]) Well-controlled on metformin.  Continue current dose. Will obtain repeat labs - Comp Met (CMET) - Lipid Profile - HgB A1c   3. Controlled type 2 diabetes mellitus with diabetic neuropathy, without long-term current use of insulin (HCC) Monofilament exam normal.  We will hold off on starting patient on any medications at this time.  Leg exercises to improve circulation reviewed and proper foot care reviewed as well.  4. Osteoarthritis of right knee, unspecified osteoarthritis type Worse in the right knee.   Will provide refill for Voltaren gel and referral to Ortho for further evaluation  5. Morbid obesity ([place]) Weight loss interventions reviewed  6. Folliculitis Hydrocortisone 1% ointment to affected areas twice a day x1 week  9. Health maintenance: - Fecal Occult Blood, Guaiac  Return to clinic in 3 months earlier if symptoms worsen  Deforest Hoyles, NP   [facility]

## 2018-03-22 LAB — LIPID PANEL
CHOLESTEROL TOTAL: 115 mg/dL (ref 100–199)
Chol/HDL Ratio: 4.4 ratio (ref 0.0–5.0)
HDL: 26 mg/dL — ABNORMAL LOW (ref >39)
LDL CALC: 64 mg/dL (ref 0–99)
Triglycerides: 123 mg/dL (ref 0–149)
VLDL CHOLESTEROL CAL: 25 mg/dL (ref 5–40)

## 2018-03-22 LAB — COMPREHENSIVE METABOLIC PANEL
ALBUMIN: 4.2 g/dL (ref 3.5–5.5)
ALK PHOS: 85 IU/L (ref 39–117)
ALT: 28 IU/L (ref 0–44)
AST: 31 IU/L (ref 0–40)
Albumin/Globulin Ratio: 1.2 (ref 1.2–2.2)
BUN / CREAT RATIO: 11 (ref 9–20)
BUN: 10 mg/dL (ref 6–24)
Bilirubin Total: 0.3 mg/dL (ref 0.0–1.2)
CALCIUM: 9.5 mg/dL (ref 8.7–10.2)
CO2: 27 mmol/L (ref 20–29)
CREATININE: 0.9 mg/dL (ref 0.76–1.27)
Chloride: 100 mmol/L (ref 96–106)
GFR calc non Af Amer: 99 mL/min/{1.73_m2} (ref >59)
GFR, EST AFRICAN AMERICAN: 115 mL/min/{1.73_m2} (ref >59)
Globulin, Total: 3.6 g/dL (ref 1.5–4.5)
Glucose: 113 mg/dL — ABNORMAL HIGH (ref 65–99)
Potassium: 5 mmol/L (ref 3.5–5.2)
Sodium: 139 mmol/L (ref 134–144)
TOTAL PROTEIN: 7.8 g/dL (ref 6.0–8.5)

## 2018-03-22 LAB — HEMOGLOBIN A1C
ESTIMATED AVERAGE GLUCOSE: 137 mg/dL
Hgb A1c MFr Bld: 6.4 % — ABNORMAL HIGH (ref 4.8–5.6)

## 2018-03-24 MED ORDER — HYDROCORTISONE 1 % EX OINT: 1.0000 "application " | TOPICAL_OINTMENT | Freq: Two times a day (BID) | CUTANEOUS | 0 refills | Status: DC

## 2018-03-24 MED ORDER — DICLOFENAC SODIUM 1 % TD GEL: 4.0000 g | Freq: Four times a day (QID) | TRANSDERMAL | 2 refills | Status: DC

## 2018-03-27 ENCOUNTER — Other Ambulatory Visit: Payer: Self-pay

## 2018-03-27 DIAGNOSIS — Z0000 Encounter for general adult medical examination without abnormal findings: Secondary | ICD-10-CM

## 2018-03-28 ENCOUNTER — Other Ambulatory Visit: Payer: Self-pay

## 2018-03-29 LAB — FECAL OCCULT BLOOD, IMMUNOCHEMICAL: Fecal Occult Bld: NEGATIVE

## 2018-04-02 ENCOUNTER — Other Ambulatory Visit: Payer: Self-pay

## 2018-04-02 MED ORDER — DICLOFENAC SODIUM 1 % TD GEL: 4.0000 g | Freq: Four times a day (QID) | TRANSDERMAL | 2 refills | Status: DC

## 2018-04-02 NOTE — Progress Notes (Unsigned)
Pt states medication not at [facility]. Resent rx

## 2018-06-07 ENCOUNTER — Encounter: Payer: Self-pay | Admitting: Emergency Medicine

## 2018-06-07 ENCOUNTER — Emergency Department
Admission: EM | Admit: 2018-06-07 | Discharge: 2018-06-07 | Disposition: A | Discharge status: Home / Self Care | Payer: Self-pay | Attending: Emergency Medicine | Admitting: Emergency Medicine

## 2018-06-07 DIAGNOSIS — I10 Essential (primary) hypertension: Secondary | ICD-10-CM | POA: Insufficient documentation

## 2018-06-07 DIAGNOSIS — M545 Low back pain, unspecified: Secondary | ICD-10-CM

## 2018-06-07 DIAGNOSIS — E119 Type 2 diabetes mellitus without complications: Secondary | ICD-10-CM | POA: Insufficient documentation

## 2018-06-07 DIAGNOSIS — Z7984 Long term (current) use of oral hypoglycemic drugs: Secondary | ICD-10-CM | POA: Insufficient documentation

## 2018-06-07 DIAGNOSIS — Z7982 Long term (current) use of aspirin: Secondary | ICD-10-CM | POA: Insufficient documentation

## 2018-06-07 DIAGNOSIS — Z79899 Other long term (current) drug therapy: Secondary | ICD-10-CM | POA: Insufficient documentation

## 2018-06-07 MED ORDER — KETOROLAC TROMETHAMINE 30 MG/ML IJ SOLN
30.0000 mg | Freq: Once | INTRAMUSCULAR | Status: AC
  Administered 2018-06-07: 30 mg via INTRAMUSCULAR
  Filled 2018-06-07: qty 1

## 2018-06-07 MED ORDER — CYCLOBENZAPRINE HCL 10 MG PO TABS
10.0000 mg | ORAL_TABLET | Freq: Once | ORAL | Status: AC
  Administered 2018-06-07: 10 mg via ORAL
  Filled 2018-06-07: qty 1

## 2018-06-07 MED ORDER — DICLOFENAC SODIUM 50 MG PO TBEC: 50.0000 mg | DELAYED_RELEASE_TABLET | Freq: Two times a day (BID) | ORAL | 0 refills | Status: AC

## 2018-06-07 MED ORDER — CYCLOBENZAPRINE HCL 5 MG PO TABS: 5.0000 mg | ORAL_TABLET | Freq: Every day | ORAL | 0 refills | Status: AC

## 2018-06-07 MED ORDER — DICLOFENAC SODIUM 50 MG PO TBEC: 50.0000 mg | DELAYED_RELEASE_TABLET | Freq: Two times a day (BID) | ORAL | 0 refills | Status: DC

## 2018-06-07 MED ORDER — KETOROLAC TROMETHAMINE 10 MG PO TABS: 10.0000 mg | ORAL_TABLET | Freq: Three times a day (TID) | ORAL | 0 refills | Status: DC

## 2018-06-07 NOTE — Discharge Instructions (Signed)
Your exam is consistent with a flare of your low back pain. You have some arthritis and mild degenerative disc disease. Take the prescription meds as directed. Follow-up with your provider for ongoing care.

## 2018-06-07 NOTE — ED Triage Notes (Signed)
Patient presents to the ED with lower back pain x 2 weeks.  Patient reports previous chronic back pain that had resolved and states this feels similar.  Patient states pain is worse when he stands up.  Patient describes pain as a "tightness".

## 2018-06-07 NOTE — ED Provider Notes (Signed)
[facility] Emergency Department Provider Note ____________________________________________  Time seen: 1257  I have reviewed the triage vital signs and the nursing notes.  HISTORY  Chief Complaint  Back Pain  HPI Nathaniel Webb is a 52 y.o. male to the ED accompanied by his wife, for evaluation of 2-week complaint of intermittent low back pain.  Patient reports onset was probably after he was doing some work outside the house including, ladder and rib pair and a deck.  He denies any distal paresthesias, footdrop, or bladder or bowel incontinence.  He does have a history of intermittent low back pain but denies any regular management.  He describes taken a single dose of ibuprofen yesterday with limited benefit.  He presents today with pain to the midline of the low back that is aggravated by prolonged standing.  Past Medical History:  Diagnosis Date  . Diabetes mellitus without complication (HCC)   . High triglycerides 05/09/2016  . Hypertension   . Low HDL (under 40) 05/09/2016  . Obesity, Class II, BMI 35-39.9, with comorbidity (HCC) 01/04/2016    Patient Active Problem List   Diagnosis Date Noted  . Osteoarthritis of right knee 02/15/2017  . Hand cramps 11/09/2016  . Tinea pedis 06/04/2016  . Low HDL (under 40) 05/09/2016  . High triglycerides 05/09/2016  . Medication monitoring encounter 05/03/2016  . Controlled type 2 diabetes mellitus without complication, without long-term current use of insulin (HCC) 01/04/2016  . Needs flu shot 01/04/2016  . Morbid obesity (HCC) 01/04/2016  . Screening for STD (sexually transmitted disease) 01/04/2016  . Encounter for cholesteral screening for cardiovascular disease 01/04/2016  . AD (atopic dermatitis) 06/09/2015  . Chronic LBP 06/09/2015  . Heel pain 06/09/2015  . Hypertension 06/09/2015  . Type 2 diabetes, controlled, with peripheral neuropathy (HCC) 12/23/2014    History reviewed. No pertinent surgical  history.  Prior to Admission medications   Medication Sig Start Date End Date Taking? Authorizing Provider  aspirin EC 81 MG tablet Take 1 tablet (81 mg total) by mouth daily. 05/03/16   Kerman PasseyLada, Melinda P, MD  atorvastatin (LIPITOR) 10 MG tablet Take 1 tablet (10 mg total) by mouth at bedtime. 02/21/17   Lada, Janit BernMelinda P, MD  benzonatate (TESSALON PERLES) 100 MG capsule Take 1 capsule (100 mg total) by mouth 3 (three) times daily as needed for cough. 01/05/18 01/05/19  Enid DerryWagner, Ashley, PA-C  Blood Gluc Meter Disp-Strips (BLOOD GLUCOSE METER DISPOSABLE) DEVI For Contour Check FSBS once a day on average, dx E11.9, LON 99 months 02/15/17   Lada, Janit BernMelinda P, MD  Blood Glucose Monitoring Suppl (CONTOUR BLOOD GLUCOSE SYSTEM) DEVI Check FSBS once a day on average, dx E11.9, LON 99 months 02/15/17   Kerman PasseyLada, Melinda P, MD  cyclobenzaprine (FLEXERIL) 5 MG tablet Take 1 tablet (5 mg total) by mouth at bedtime. May take QHS or up to TID prn 06/07/18 07/07/18  Christianjames Soule, Charlesetta IvoryJenise V Bacon, PA-C  diclofenac (VOLTAREN) 50 MG EC tablet Take 1 tablet (50 mg total) by mouth 2 (two) times daily. 06/07/18 07/07/18  Rakiyah Esch, Charlesetta IvoryJenise V Bacon, PA-C  diclofenac sodium (VOLTAREN) 1 % GEL Apply 4 g topically 4 (four) times daily. 04/02/18   Tukov-Yual, Alroy BailiffMagdalene S, NP  fluticasone (FLONASE) 50 MCG/ACT nasal spray Place 2 sprays into both nostrils daily. 01/05/18 01/05/19  Enid DerryWagner, Ashley, PA-C  hydrocortisone 1 % ointment Apply 1 application topically 2 (two) times daily. Apply to rash on back of the head bid x 1 week 03/24/18  Tukov-Yual, Magdalene S, NP  ketorolac (TORADOL) 10 MG tablet Take 1 tablet (10 mg total) by mouth every 8 (eight) hours. 06/07/18   Chrishawn Boley, Charlesetta Ivory, PA-C  Lancets (ACCU-CHEK SOFT TOUCH) lancets Check FSBS once a day on average, dx E11.9, LON 99 months 02/15/17   Lada, Janit Bern, MD  lisinopril (PRINIVIL,ZESTRIL) 20 MG tablet TAKE 1 TABLET BY MOUTH DAILY 11/01/17   Lada, Janit Bern, MD  metFORMIN (GLUCOPHAGE) 500 MG tablet  Take 1 tablet (500 mg total) by mouth daily. (best if taking 15-20 minutes before biggest meal of the day) 11/09/16   Lada, Janit Bern, MD  omega-3 acid ethyl esters (LOVAZA) 1 g capsule Take 2 capsules (2 g total) by mouth 2 (two) times daily. (this replaces your fish oil) 11/15/16   Lada, Janit Bern, MD    Allergies Patient has no known allergies.  Family History  Problem Relation Age of Onset  . Diabetes Father   . COPD Mother     Social History Social History   Tobacco Use  . Smoking status: Former Games developer  . Smokeless tobacco: Never Used  Substance Use Topics  . Alcohol use: No    Alcohol/week: 0.0 oz  . Drug use: No    Review of Systems  Constitutional: Negative for fever. Cardiovascular: Negative for chest pain. Respiratory: Negative for shortness of breath. Gastrointestinal: Negative for abdominal pain, vomiting and diarrhea. Genitourinary: Negative for dysuria. Musculoskeletal: Positive for back pain. Skin: Negative for rash. Neurological: Negative for headaches, focal weakness or numbness. ____________________________________________  PHYSICAL EXAM:  VITAL SIGNS: ED Triage Vitals  Enc Vitals Group     BP 06/07/18 1217 (!) 168/89     Pulse Rate 06/07/18 1217 64     Resp 06/07/18 1217 18     Temp 06/07/18 1217 98.3 F (36.8 C)     Temp Source 06/07/18 1217 Oral     SpO2 06/07/18 1217 98 %     Weight 06/07/18 1218 238 lb (108 kg)     Height 06/07/18 1218 5\' 5"  (1.651 m)     Head Circumference --      Peak Flow --      Pain Score 06/07/18 1218 8     Pain Loc --      Pain Edu? --      Excl. in GC? --     Constitutional: Alert and oriented. Well appearing and in no distress. Head: Normocephalic and atraumatic. Cardiovascular: Normal rate, regular rhythm. Normal distal pulses. Respiratory: Normal respiratory effort. No wheezes/rales/rhonchi. Gastrointestinal: Soft and nontender. No distention. Musculoskeletal: No spinal alignment without midline  tenderness, spasm, deformity, or step-off.  Patient with normal hip flexion extension range.  Nontender with normal range of motion in all extremities.  Neurologic: Cranial nerves II through XII grossly intact.  Normal LE DTRs bilaterally.  Negative supine straight leg raise.  Normal gait without ataxia. Normal speech and language. No gross focal neurologic deficits are appreciated. ____________________________________________  PROCEDURES  Procedures Toradol 30 mg IM Flexeril 10 mg PO ____________________________________________  INITIAL IMPRESSION / ASSESSMENT AND PLAN / ED COURSE  Patient with ED evaluation of intermittent LBP. He reports improvement of his symptoms after ED evaluation of medications. He will be discharged with prescriptions for Flexeril, Voltaren, and Toradol. He is advised to start the Voltaren after the Toradol is complete. He will see his provider at [facility] for ongoing symptoms.  ____________________________________________  FINAL CLINICAL IMPRESSION(S) / ED DIAGNOSES  Final diagnoses:  Acute right-sided low  back pain without sciatica      Lissa Hoard, PA-C 06/07/18 1900    Dionne Bucy, MD 06/10/18 2211

## 2018-06-14 ENCOUNTER — Encounter: Payer: Self-pay | Admitting: Emergency Medicine

## 2018-06-14 ENCOUNTER — Emergency Department: Payer: Self-pay

## 2018-06-14 ENCOUNTER — Other Ambulatory Visit: Payer: Self-pay

## 2018-06-14 ENCOUNTER — Emergency Department
Admission: EM | Admit: 2018-06-14 | Discharge: 2018-06-14 | Disposition: A | Discharge status: Home / Self Care | Payer: Self-pay | Attending: Emergency Medicine | Admitting: Emergency Medicine

## 2018-06-14 DIAGNOSIS — Z7984 Long term (current) use of oral hypoglycemic drugs: Secondary | ICD-10-CM | POA: Insufficient documentation

## 2018-06-14 DIAGNOSIS — Z79899 Other long term (current) drug therapy: Secondary | ICD-10-CM | POA: Insufficient documentation

## 2018-06-14 DIAGNOSIS — Z87891 Personal history of nicotine dependence: Secondary | ICD-10-CM | POA: Insufficient documentation

## 2018-06-14 DIAGNOSIS — E119 Type 2 diabetes mellitus without complications: Secondary | ICD-10-CM | POA: Insufficient documentation

## 2018-06-14 DIAGNOSIS — Z7982 Long term (current) use of aspirin: Secondary | ICD-10-CM | POA: Insufficient documentation

## 2018-06-14 DIAGNOSIS — I10 Essential (primary) hypertension: Secondary | ICD-10-CM | POA: Insufficient documentation

## 2018-06-14 DIAGNOSIS — M5441 Lumbago with sciatica, right side: Secondary | ICD-10-CM | POA: Insufficient documentation

## 2018-06-14 LAB — GLUCOSE, CAPILLARY: Glucose-Capillary: 102 mg/dL — ABNORMAL HIGH (ref 70–99)

## 2018-06-14 MED ORDER — METHOCARBAMOL 500 MG PO TABS
1000.0000 mg | ORAL_TABLET | Freq: Once | ORAL | Status: AC
  Administered 2018-06-14: 1000 mg via ORAL
  Filled 2018-06-14: qty 2

## 2018-06-14 MED ORDER — PREDNISONE 50 MG PO TABS: ORAL_TABLET | ORAL | 0 refills | Status: DC

## 2018-06-14 MED ORDER — METHOCARBAMOL 500 MG PO TABS: 500.0000 mg | ORAL_TABLET | Freq: Three times a day (TID) | ORAL | 0 refills | Status: AC | PRN

## 2018-06-14 NOTE — ED Triage Notes (Signed)
Pt to ED via POV c/o back pain x 3 weeks. Pt states that he was seen recently for similar complaint and given medication but it hasn't helped. Pt is in NAD at this time.

## 2018-06-14 NOTE — ED Provider Notes (Signed)
[facility] Emergency Department Provider Note  ____________________________________________  Time seen: Approximately 8:49 PM  I have reviewed the triage vital signs and the nursing notes.   HISTORY  Chief Complaint Back Pain    HPI Nathaniel Webb is a 52 y.o. male presents to the emergency department with 8 out of 10 bilateral low back pain that radiates into the right lower buttocks.  Patient reports that he has had back pain for approximately 1 month.  No weakness or changes in sensation of the lower extremities.  Patient denies bowel or bladder incontinence.  No falls or mechanisms of trauma.  No recent episodes of extreme heavy lifting.  Patient reports that he was seen approximately 3 weeks ago and did not fill Flexeril prescription because he "did not think it would work".  Patient denies dysuria, hematuria and increased urinary frequency.   Past Medical History:  Diagnosis Date  . Diabetes mellitus without complication (HCC)   . High triglycerides 05/09/2016  . Hypertension   . Low HDL (under 40) 05/09/2016  . Obesity, Class II, BMI 35-39.9, with comorbidity (HCC) 01/04/2016    Patient Active Problem List   Diagnosis Date Noted  . Osteoarthritis of right knee 02/15/2017  . Hand cramps 11/09/2016  . Tinea pedis 06/04/2016  . Low HDL (under 40) 05/09/2016  . High triglycerides 05/09/2016  . Medication monitoring encounter 05/03/2016  . Controlled type 2 diabetes mellitus without complication, without long-term current use of insulin (HCC) 01/04/2016  . Needs flu shot 01/04/2016  . Morbid obesity (HCC) 01/04/2016  . Screening for STD (sexually transmitted disease) 01/04/2016  . Encounter for cholesteral screening for cardiovascular disease 01/04/2016  . AD (atopic dermatitis) 06/09/2015  . Chronic LBP 06/09/2015  . Heel pain 06/09/2015  . Hypertension 06/09/2015  . Type 2 diabetes, controlled, with peripheral neuropathy (HCC) 12/23/2014     History reviewed. No pertinent surgical history.  Prior to Admission medications   Medication Sig Start Date End Date Taking? Authorizing Provider  aspirin EC 81 MG tablet Take 1 tablet (81 mg total) by mouth daily. 05/03/16   Kerman Passey, MD  atorvastatin (LIPITOR) 10 MG tablet Take 1 tablet (10 mg total) by mouth at bedtime. 02/21/17   Lada, Janit Bern, MD  benzonatate (TESSALON PERLES) 100 MG capsule Take 1 capsule (100 mg total) by mouth 3 (three) times daily as needed for cough. 01/05/18 01/05/19  Enid Derry, PA-C  Blood Gluc Meter Disp-Strips (BLOOD GLUCOSE METER DISPOSABLE) DEVI For Contour Check FSBS once a day on average, dx E11.9, LON 99 months 02/15/17   Lada, Janit Bern, MD  Blood Glucose Monitoring Suppl (CONTOUR BLOOD GLUCOSE SYSTEM) DEVI Check FSBS once a day on average, dx E11.9, LON 99 months 02/15/17   Kerman Passey, MD  cyclobenzaprine (FLEXERIL) 5 MG tablet Take 1 tablet (5 mg total) by mouth at bedtime. May take QHS or up to TID prn 06/07/18 07/07/18  Menshew, Charlesetta Ivory, PA-C  diclofenac (VOLTAREN) 50 MG EC tablet Take 1 tablet (50 mg total) by mouth 2 (two) times daily. 06/07/18 07/07/18  Menshew, Charlesetta Ivory, PA-C  diclofenac sodium (VOLTAREN) 1 % GEL Apply 4 g topically 4 (four) times daily. 04/02/18   Tukov-Yual, Alroy Bailiff, NP  fluticasone (FLONASE) 50 MCG/ACT nasal spray Place 2 sprays into both nostrils daily. 01/05/18 01/05/19  Enid Derry, PA-C  hydrocortisone 1 % ointment Apply 1 application topically 2 (two) times daily. Apply to rash on back of the head  bid x 1 week 03/24/18   Tukov-Yual, Alroy BailiffMagdalene S, NP  ketorolac (TORADOL) 10 MG tablet Take 1 tablet (10 mg total) by mouth every 8 (eight) hours. 06/07/18   Menshew, Charlesetta IvoryJenise V Bacon, PA-C  Lancets (ACCU-CHEK SOFT TOUCH) lancets Check FSBS once a day on average, dx E11.9, LON 99 months 02/15/17   Lada, Janit BernMelinda P, MD  lisinopril (PRINIVIL,ZESTRIL) 20 MG tablet TAKE 1 TABLET BY MOUTH DAILY 11/01/17   Lada, Janit BernMelinda  P, MD  metFORMIN (GLUCOPHAGE) 500 MG tablet Take 1 tablet (500 mg total) by mouth daily. (best if taking 15-20 minutes before biggest meal of the day) 11/09/16   Lada, Janit BernMelinda P, MD  methocarbamol (ROBAXIN) 500 MG tablet Take 1 tablet (500 mg total) by mouth 3 (three) times daily as needed for up to 5 days. 06/14/18 06/19/18  Orvil FeilWoods, Zalma Channing M, PA-C  omega-3 acid ethyl esters (LOVAZA) 1 g capsule Take 2 capsules (2 g total) by mouth 2 (two) times daily. (this replaces your fish oil) 11/15/16   Lada, Janit BernMelinda P, MD  predniSONE (DELTASONE) 50 MG tablet Take one 50 mg tablet once daily for the next five days. 06/14/18   Orvil FeilWoods, Rose-Marie Hickling M, PA-C    Allergies Patient has no known allergies.  Family History  Problem Relation Age of Onset  . Diabetes Father   . COPD Mother     Social History Social History   Tobacco Use  . Smoking status: Former Games developermoker  . Smokeless tobacco: Never Used  Substance Use Topics  . Alcohol use: No    Alcohol/week: 0.0 oz  . Drug use: No     Review of Systems  Constitutional: No fever/chills Eyes: No visual changes. No discharge ENT: No upper respiratory complaints. Cardiovascular: no chest pain. Respiratory: no cough. No SOB. Gastrointestinal: No abdominal pain.  No nausea, no vomiting.  No diarrhea.  No constipation. Genitourinary: Negative for dysuria. No hematuria Musculoskeletal: Patient has low back pain.  Skin: Negative for rash, abrasions, lacerations, ecchymosis. Neurological: Negative for headaches, focal weakness or numbness.   ____________________________________________   PHYSICAL EXAM:  VITAL SIGNS: ED Triage Vitals  Enc Vitals Group     BP 06/14/18 1611 (!) 146/94     Pulse Rate 06/14/18 1611 66     Resp 06/14/18 1611 16     Temp 06/14/18 1611 98.4 F (36.9 C)     Temp Source 06/14/18 1611 Oral     SpO2 06/14/18 1611 97 %     Weight 06/14/18 1612 236 lb (107 kg)     Height 06/14/18 1612 5\' 5"  (1.651 m)     Head Circumference --       Peak Flow --      Pain Score 06/14/18 1611 5     Pain Loc --      Pain Edu? --      Excl. in GC? --      Constitutional: Alert and oriented. Well appearing and in no acute distress. Eyes: Conjunctivae are normal. PERRL. EOMI. Head: Atraumatic. Cardiovascular: Normal rate, regular rhythm. Normal S1 and S2.  Good peripheral circulation. Respiratory: Normal respiratory effort without tachypnea or retractions. Lungs CTAB. Good air entry to the bases with no decreased or absent breath sounds. Gastrointestinal: Bowel sounds 4 quadrants. Soft and nontender to palpation. No guarding or rigidity. No palpable masses. No distention. No CVA tenderness. Musculoskeletal: Full range of motion to all extremities. No gross deformities appreciated.  Patient has paraspinal muscle tenderness to palpation along the lumbar spine.  Negative straight leg raise bilaterally. Neurologic:  Normal speech and language. No gross focal neurologic deficits are appreciated.  Skin:  Skin is warm, dry and intact. No rash noted.   ____________________________________________   LABS (all labs ordered are listed, but only abnormal results are displayed)  Labs Reviewed  GLUCOSE, CAPILLARY - Abnormal; Notable for the following components:      Result Value   Glucose-Capillary 102 (*)    All other components within normal limits  CBG MONITORING, ED   ____________________________________________  EKG   ____________________________________________  RADIOLOGY I personally viewed and evaluated these images as part of my medical decision making, as well as reviewing the written report by the radiologist.  Dg Lumbar Spine 2-3 Views  Result Date: 06/14/2018 CLINICAL DATA:  Low back pain for 5 years. EXAM: LUMBAR SPINE - 2-3 VIEW COMPARISON:  12/23/2014. FINDINGS: Disc space narrowing L5-S1. BILATERAL L5 spondylolysis with 5 mm of spondylolisthesis. Vascular calcification. No other significant lumbar spine findings.  Incompletely visualized RIGHT colonic region demonstrates increased radiodensity, possible ingested bismuth or previous barium study. 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 Disc space narrowing. Similar appearance to 2016. Aortic Atherosclerosis (ICD10-I70.0). Electronically Signed   By: Elsie Stain M.D.   On: 06/14/2018 18:05    ____________________________________________    PROCEDURES  Procedure(s) performed:    Procedures    Medications  methocarbamol (ROBAXIN) tablet 1,000 mg (1,000 mg Oral Given 06/14/18 1745)     ____________________________________________   INITIAL IMPRESSION / ASSESSMENT AND PLAN / ED COURSE  Pertinent labs & imaging results that were available during my care of the patient were reviewed by me and considered in my medical decision making (see chart for details).  Review of the [place] CSRS was performed in accordance of the [facility] prior to dispensing any controlled drugs.      Assessment and plan Low back pain Patient presents to the emergency department with low back pain for the past 4 weeks.  Patient was given Robaxin in the emergency department and he reported that his pain improved significantly.  Patient was treated empirically with prednisone as he has used anti-inflammatories with no relief.  Patient was also discharged with Robaxin.  Vital signs are reassuring prior to discharge.  All patient questions were answered.    ____________________________________________  FINAL CLINICAL IMPRESSION(S) / ED DIAGNOSES  Final diagnoses:  Acute bilateral low back pain with right-sided sciatica      NEW MEDICATIONS STARTED DURING THIS VISIT:  ED Discharge Orders        Ordered    predniSONE (DELTASONE) 50 MG tablet     06/14/18 1904    methocarbamol (ROBAXIN) 500 MG tablet  3 times daily PRN     06/14/18 1904          This chart was dictated using voice recognition software/Dragon. Despite best  efforts to proofread, errors can occur which can change the meaning. Any change was purely unintentional.    Gasper Lloyd 06/14/18 2053    Sharman Cheek, MD 06/18/18 2046

## 2018-06-14 NOTE — ED Notes (Signed)
Discharge instructions reviewed with patient. Questions fielded by this RN. Patient verbalizes understanding of instructions. Patient discharged home in stable condition per [place]. No acute distress noted at time of discharge.   No peripheral IV placed this visit.

## 2018-06-14 NOTE — ED Notes (Signed)
See triage note  Presents with lower back pain   States he was seen for same about 3 weeks ago  But states pain is not any better

## 2018-06-27 ENCOUNTER — Encounter: Payer: Self-pay | Admitting: Adult Health

## 2018-06-27 ENCOUNTER — Ambulatory Visit: Payer: Self-pay | Admitting: Adult Health

## 2018-06-27 VITALS — BP 141/93 | HR 59 | Temp 98.1°F | Ht 63.5 in | Wt 242.9 lb

## 2018-06-27 DIAGNOSIS — M545 Low back pain, unspecified: Secondary | ICD-10-CM

## 2018-06-27 DIAGNOSIS — M1711 Unilateral primary osteoarthritis, right knee: Secondary | ICD-10-CM

## 2018-06-27 DIAGNOSIS — E119 Type 2 diabetes mellitus without complications: Secondary | ICD-10-CM

## 2018-06-27 DIAGNOSIS — E1142 Type 2 diabetes mellitus with diabetic polyneuropathy: Secondary | ICD-10-CM

## 2018-06-27 DIAGNOSIS — G8929 Other chronic pain: Secondary | ICD-10-CM

## 2018-06-27 NOTE — Progress Notes (Signed)
Patient: Nathaniel MagesJohnny R Webb Male    DOB: 04/16/1966   52 y.o.   MRN: [id number] Visit Date: 06/27/2018  Today's Provider: Shawn RouteMagddalene S Tukov-Yual, NP   Chief Complaint  Patient presents with  . Follow-up    Blood pressure assessment   Subjective:    HPI This is A 52 Y/O male who presents for f/u hypertension, T2DM, hyperlipidemia, morbid obesity and OA of the right knee. He is c/o back pain that started about 4 weeks ago. Pain is localized in his lower back; associated with bilateral lower extremity numbness and tingling; but no shooting pain down the legs.  He rates the pain as an 8 on 10 when he went to the emergency room but now improved to a 4 on 10.  He denies any other associated symptoms.  Pain got so bad that he had to go to the ED X2.  He was given a prednisone taper and Robaxin at the emergency room.  He reports moderate relief current medications.  He has not been checking his blood glucose at home as recommended because the lancets won't fit in the machine. He does not check his blood pressure at home. He does not check his blood pressure at home.  He denies complying with daily exercise recommendations.  He denies any medication side effects He denies signs and symptoms of hypo-and hyperglycemia, headache, dizziness, nausea, vomiting  and diarrhea.  He reports taking all his medications as prescribed.  He does not require any refills today  No Known Allergies Previous Medications   ASPIRIN EC 81 MG TABLET    Take 1 tablet (81 mg total) by mouth daily.   ATORVASTATIN (LIPITOR) 10 MG TABLET    Take 1 tablet (10 mg total) by mouth at bedtime.   BENZONATATE (TESSALON PERLES) 100 MG CAPSULE    Take 1 capsule (100 mg total) by mouth 3 (three) times daily as needed for cough.   BLOOD GLUC METER DISP-STRIPS (BLOOD GLUCOSE METER DISPOSABLE) DEVI    For Contour Check FSBS once a day on average, dx E11.9, LON 99 months   BLOOD GLUCOSE MONITORING SUPPL (CONTOUR BLOOD GLUCOSE SYSTEM) DEVI    Check  FSBS once a day on average, dx E11.9, LON 99 months   CHOLECALCIFEROL (VITAMIN D) 1000 UNITS TABLET    Take 1,000 Units by mouth daily.   CYCLOBENZAPRINE (FLEXERIL) 5 MG TABLET    Take 1 tablet (5 mg total) by mouth at bedtime. May take QHS or up to TID prn   DICLOFENAC (VOLTAREN) 50 MG EC TABLET    Take 1 tablet (50 mg total) by mouth 2 (two) times daily.   DICLOFENAC SODIUM (VOLTAREN) 1 % GEL    Apply 4 g topically 4 (four) times daily.   FLUTICASONE (FLONASE) 50 MCG/ACT NASAL SPRAY    Place 2 sprays into both nostrils daily.   HYDROCORTISONE 1 % OINTMENT    Apply 1 application topically 2 (two) times daily. Apply to rash on back of the head bid x 1 week   KETOROLAC (TORADOL) 10 MG TABLET    Take 1 tablet (10 mg total) by mouth every 8 (eight) hours.   LANCETS (ACCU-CHEK SOFT TOUCH) LANCETS    Check FSBS once a day on average, dx E11.9, LON 99 months   LISINOPRIL (PRINIVIL,ZESTRIL) 20 MG TABLET    TAKE 1 TABLET BY MOUTH DAILY   METFORMIN (GLUCOPHAGE) 500 MG TABLET    Take 1 tablet (500 mg total) by mouth daily. (best if  taking 15-20 minutes before biggest meal of the day)   OMEGA-3 ACID ETHYL ESTERS (LOVAZA) 1 G CAPSULE    Take 2 capsules (2 g total) by mouth 2 (two) times daily. (this replaces your fish oil)   PREDNISONE (DELTASONE) 50 MG TABLET    Take one 50 mg tablet once daily for the next five days.    Review of Systems  Constitutional: Negative.   Respiratory: Negative.   Gastrointestinal: Negative.   Endocrine: Negative.   Genitourinary: Negative for dysuria, flank pain and urgency.  Musculoskeletal: Positive for back pain (LOW back pain). Negative for myalgias.  Skin: Negative.   Neurological: Positive for numbness (BLLE motly at night).  Psychiatric/Behavioral: Negative.     Social History   Tobacco Use  . Smoking status: Former Games developermoker  . Smokeless tobacco: Never Used  Substance Use Topics  . Alcohol use: No    Alcohol/week: 0.0 standard drinks   Objective:   BP (!)  141/93 (BP Location: Left Arm, Patient Position: Sitting)   Pulse (!) 59   Temp 98.1 F (36.7 C) (Oral)   Ht 5' 3.5" (1.613 m)   Wt 242 lb 14.4 oz (110.2 kg)   BMI 42.35 kg/m   Physical Exam  Constitutional: He is oriented to person, place, and time. He appears well-developed and well-nourished.  Eyes: Pupils are equal, round, and reactive to light. Conjunctivae are normal.  Neck: Normal range of motion. Neck supple.  Cardiovascular: Normal rate, regular rhythm, normal heart sounds and intact distal pulses.  Pulmonary/Chest: Effort normal and breath sounds normal.  Abdominal: Soft. Bowel sounds are normal. He exhibits distension (Profound central obesity). He exhibits no mass. There is no guarding.  Musculoskeletal: Normal range of motion. He exhibits no edema, tenderness or deformity.  Neurological: He is alert and oriented to person, place, and time. No sensory deficit.  Skin: Skin is warm and dry. Capillary refill takes less than 2 seconds.  Psychiatric: He has a normal mood and affect.  Nursing note and vitals reviewed.       Assessment & Plan:  1. Type 2 diabetes, controlled, with peripheral neuropathy (HCC) Continue metformin Monitor blood glucose every morning and evening and keep a log of readings Call clinic or return to clinic if blood glucose readings are greater than 300 mg/dL  2. Controlled type 2 diabetes mellitus without complication, without long-term current use of insulin (HCC) Well-controlled.  Continue as indicated above Discussed at length the need for daily exercise, limitation of high caloric drinks and sugar intake.  Discussed alternatives dietary substitutions with patient as well as healthy snack choices  3. Osteoarthritis of right knee, unspecified osteoarthritis type Pain was well-controlled with over-the-counter remedies.  Continue Tylenol and Motrin as needed  4. Morbid obesity (HCC) Weight loss exercises on lifestyle modification reviewed  5.  Chronic low back pain without sciatica, unspecified back pain laterality Patient encouraged to do back and abdominal muscle strengthening exercises and take over-the-counter remedies He been advised to return to the clinic in 1 month or earlier if symptoms worsen  Shawn RouteMagddalene S Tukov-Yual, NP   [facility] [place]

## 2018-06-27 NOTE — Patient Instructions (Signed)
Back Pain, Adult Back pain is very common. The pain often gets better over time. The cause of back pain is usually not dangerous. Most people can learn to manage their back pain on their own. Follow these instructions at home: Watch your back pain for any changes. The following actions may help to lessen any pain you are feeling:  Stay active. Start with short walks on flat ground if you can. Try to walk farther each day.  Exercise regularly as told by your doctor. Exercise helps your back heal faster. It also helps avoid future injury by keeping your muscles strong and flexible.  Do not sit, drive, or stand in one place for more than 30 minutes.  Do not stay in bed. Resting more than 1-2 days can slow down your recovery.  Be careful when you bend or lift an object. Use good form when lifting: ? Bend at your knees. ? Keep the object close to your body. ? Do not twist.  Sleep on a firm mattress. Lie on your side, and bend your knees. If you lie on your back, put a pillow under your knees.  Take medicines only as told by your doctor.  Put ice on the injured area. ? Put ice in a plastic bag. ? Place a towel between your skin and the bag. ? Leave the ice on for 20 minutes, 2-3 times a day for the first 2-3 days. After that, you can switch between ice and heat packs.  Avoid feeling anxious or stressed. Find good ways to deal with stress, such as exercise.  Maintain a healthy weight. Extra weight puts stress on your back.  Contact a doctor if:  You have pain that does not go away with rest or medicine.  You have worsening pain that goes down into your legs or buttocks.  You have pain that does not get better in one week.  You have pain at night.  You lose weight.  You have a fever or chills. Get help right away if:  You cannot control when you poop (bowel movement) or pee (urinate).  Your arms or legs feel weak.  Your arms or legs lose feeling (numbness).  You feel sick  to your stomach (nauseous) or throw up (vomit).  You have belly (abdominal) pain.  You feel like you may pass out (faint). This information is not intended to replace advice given to you by your health care provider. Make sure you discuss any questions you have with your health care provider. Document Released: 04/17/2008 Document Revised: 04/06/2016 Document Reviewed: 03/03/2014 Elsevier Interactive Patient Education  2018 [employer]  Back Exercises If you have pain in your back, do these exercises 2-3 times each day or as told by your doctor. When the pain goes away, do the exercises once each day, but repeat the steps more times for each exercise (do more repetitions). If you do not have pain in your back, do these exercises once each day or as told by your doctor. Exercises Single Knee to Chest  Do these steps 3-5 times in a row for each leg: 1. Lie on your back on a firm bed or the floor with your legs stretched out. 2. Bring one knee to your chest. 3. Hold your knee to your chest by grabbing your knee or thigh. 4. Pull on your knee until you feel a gentle stretch in your lower back. 5. Keep doing the stretch for 10-30 seconds. 6. Slowly let go of your leg   and straighten it.  Pelvic Tilt  Do these steps 5-10 times in a row: 1. Lie on your back on a firm bed or the floor with your legs stretched out. 2. Bend your knees so they point up to the ceiling. Your feet should be flat on the floor. 3. Tighten your lower belly (abdomen) muscles to press your lower back against the floor. This will make your tailbone point up to the ceiling instead of pointing down to your feet or the floor. 4. Stay in this position for 5-10 seconds while you gently tighten your muscles and breathe evenly.  Cat-Cow  Do these steps until your lower back bends more easily: 1. Get on your hands and knees on a firm surface. Keep your hands under your shoulders, and keep your knees under your hips. You may  put padding under your knees. 2. Let your head hang down, and make your tailbone point down to the floor so your lower back is round like the back of a cat. 3. Stay in this position for 5 seconds. 4. Slowly lift your head and make your tailbone point up to the ceiling so your back hangs low (sags) like the back of a cow. 5. Stay in this position for 5 seconds.  Press-Ups  Do these steps 5-10 times in a row: 1. Lie on your belly (face-down) on the floor. 2. Place your hands near your head, about shoulder-width apart. 3. While you keep your back relaxed and keep your hips on the floor, slowly straighten your arms to raise the top half of your body and lift your shoulders. Do not use your back muscles. To make yourself more comfortable, you may change where you place your hands. 4. Stay in this position for 5 seconds. 5. Slowly return to lying flat on the floor.  Bridges  Do these steps 10 times in a row: 1. Lie on your back on a firm surface. 2. Bend your knees so they point up to the ceiling. Your feet should be flat on the floor. 3. Tighten your butt muscles and lift your butt off of the floor until your waist is almost as high as your knees. If you do not feel the muscles working in your butt and the back of your thighs, slide your feet 1-2 inches farther away from your butt. 4. Stay in this position for 3-5 seconds. 5. Slowly lower your butt to the floor, and let your butt muscles relax.  If this exercise is too easy, try doing it with your arms crossed over your chest. Belly Crunches  Do these steps 5-10 times in a row: 1. Lie on your back on a firm bed or the floor with your legs stretched out. 2. Bend your knees so they point up to the ceiling. Your feet should be flat on the floor. 3. Cross your arms over your chest. 4. Tip your chin a little bit toward your chest but do not bend your neck. 5. Tighten your belly muscles and slowly raise your chest just enough to lift your  shoulder blades a tiny bit off of the floor. 6. Slowly lower your chest and your head to the floor.  Back Lifts Do these steps 5-10 times in a row: 1. Lie on your belly (face-down) with your arms at your sides, and rest your forehead on the floor. 2. Tighten the muscles in your legs and your butt. 3. Slowly lift your chest off of the floor while you keep your   hips on the floor. Keep the back of your head in line with the curve in your back. Look at the floor while you do this. 4. Stay in this position for 3-5 seconds. 5. Slowly lower your chest and your face to the floor.  Contact a doctor if:  Your back pain gets a lot worse when you do an exercise.  Your back pain does not lessen 2 hours after you exercise. If you have any of these problems, stop doing the exercises. Do not do them again unless your doctor says it is okay. Get help right away if:  You have sudden, very bad back pain. If this happens, stop doing the exercises. Do not do them again unless your doctor says it is okay. This information is not intended to replace advice given to you by your health care provider. Make sure you discuss any questions you have with your health care provider. Document Released: 12/02/2010 Document Revised: 04/06/2016 Document Reviewed: 12/24/2014 Elsevier Interactive Patient Education  2018 [employer] Inc.  

## 2018-07-08 ENCOUNTER — Emergency Department
Admission: EM | Admit: 2018-07-08 | Discharge: 2018-07-08 | Disposition: A | Discharge status: Home / Self Care | Payer: Self-pay | Attending: Emergency Medicine | Admitting: Emergency Medicine

## 2018-07-08 ENCOUNTER — Other Ambulatory Visit: Payer: Self-pay

## 2018-07-08 ENCOUNTER — Encounter: Payer: Self-pay | Admitting: Emergency Medicine

## 2018-07-08 DIAGNOSIS — M549 Dorsalgia, unspecified: Secondary | ICD-10-CM | POA: Insufficient documentation

## 2018-07-08 DIAGNOSIS — G8929 Other chronic pain: Secondary | ICD-10-CM | POA: Insufficient documentation

## 2018-07-08 DIAGNOSIS — Z79899 Other long term (current) drug therapy: Secondary | ICD-10-CM | POA: Insufficient documentation

## 2018-07-08 DIAGNOSIS — Z7982 Long term (current) use of aspirin: Secondary | ICD-10-CM | POA: Insufficient documentation

## 2018-07-08 DIAGNOSIS — Z87891 Personal history of nicotine dependence: Secondary | ICD-10-CM | POA: Insufficient documentation

## 2018-07-08 DIAGNOSIS — I10 Essential (primary) hypertension: Secondary | ICD-10-CM | POA: Insufficient documentation

## 2018-07-08 DIAGNOSIS — E1140 Type 2 diabetes mellitus with diabetic neuropathy, unspecified: Secondary | ICD-10-CM | POA: Insufficient documentation

## 2018-07-08 LAB — URINALYSIS, ROUTINE W REFLEX MICROSCOPIC
Bilirubin Urine: NEGATIVE
GLUCOSE, UA: NEGATIVE mg/dL
Hgb urine dipstick: NEGATIVE
Ketones, ur: NEGATIVE mg/dL
LEUKOCYTES UA: NEGATIVE
Nitrite: NEGATIVE
Protein, ur: NEGATIVE mg/dL
SPECIFIC GRAVITY, URINE: 1.013 (ref 1.005–1.030)
pH: 5 (ref 5.0–8.0)

## 2018-07-08 MED ORDER — IBUPROFEN 600 MG PO TABS
600.0000 mg | ORAL_TABLET | Freq: Once | ORAL | Status: AC
  Administered 2018-07-08: 600 mg via ORAL
  Filled 2018-07-08: qty 1

## 2018-07-08 MED ORDER — CYCLOBENZAPRINE HCL 10 MG PO TABS: 10.0000 mg | ORAL_TABLET | Freq: Three times a day (TID) | ORAL | 0 refills | Status: DC | PRN

## 2018-07-08 MED ORDER — TRAMADOL HCL 50 MG PO TABS
50.0000 mg | ORAL_TABLET | Freq: Once | ORAL | Status: AC
  Administered 2018-07-08: 50 mg via ORAL
  Filled 2018-07-08: qty 1

## 2018-07-08 MED ORDER — MELOXICAM 15 MG PO TABS: 15.0000 mg | ORAL_TABLET | Freq: Every day | ORAL | 0 refills | Status: DC

## 2018-07-08 NOTE — ED Provider Notes (Signed)
[facility] Emergency Department Provider Note   ____________________________________________   First MD Initiated Contact with Patient 07/08/18 1158     (approximate)  I have reviewed the triage vital signs and the nursing notes.   HISTORY  Chief Complaint Back Pain    HPI Nathaniel Webb is a 52 y.o. male patient complain low back pain for 1 month.  Patient denies provocative incident for complaint.  Patient state pain increases when he stands.  Patient denies radicular component to his back pain.  Patient denies bladder bowel dysfunction.  Patient rates pain as a 5/10.  Pain described pain is "achy".  No palliative measure for complaint.   Past Medical History:  Diagnosis Date  . Diabetes mellitus without complication (HCC)   . High triglycerides 05/09/2016  . Hypertension   . Low HDL (under 40) 05/09/2016  . Obesity, Class II, BMI 35-39.9, with comorbidity (HCC) 01/04/2016    Patient Active Problem List   Diagnosis Date Noted  . Osteoarthritis of right knee 02/15/2017  . Hand cramps 11/09/2016  . Tinea pedis 06/04/2016  . Low HDL (under 40) 05/09/2016  . High triglycerides 05/09/2016  . Medication monitoring encounter 05/03/2016  . Controlled type 2 diabetes mellitus without complication, without long-term current use of insulin (HCC) 01/04/2016  . Needs flu shot 01/04/2016  . Morbid obesity (HCC) 01/04/2016  . Screening for STD (sexually transmitted disease) 01/04/2016  . Encounter for cholesteral screening for cardiovascular disease 01/04/2016  . AD (atopic dermatitis) 06/09/2015  . Chronic low back pain 06/09/2015  . Heel pain 06/09/2015  . Hypertension 06/09/2015  . Type 2 diabetes, controlled, with peripheral neuropathy (HCC) 12/23/2014    History reviewed. No pertinent surgical history.  Prior to Admission medications   Medication Sig Start Date End Date Taking? Authorizing Provider  aspirin EC 81 MG tablet Take 1 tablet (81 mg  total) by mouth daily. 05/03/16  Yes Lada, Janit Bern, MD  atorvastatin (LIPITOR) 10 MG tablet Take 1 tablet (10 mg total) by mouth at bedtime. 02/21/17  Yes Lada, Janit Bern, MD  Blood Gluc Meter Disp-Strips (BLOOD GLUCOSE METER DISPOSABLE) DEVI For Contour Check FSBS once a day on average, dx E11.9, LON 99 months 02/15/17  Yes Lada, Janit Bern, MD  Blood Glucose Monitoring Suppl (CONTOUR BLOOD GLUCOSE SYSTEM) DEVI Check FSBS once a day on average, dx E11.9, LON 99 months 02/15/17  Yes Lada, Janit Bern, MD  cholecalciferol (VITAMIN D) 1000 units tablet Take 1,000 Units by mouth daily.   Yes [provider]  fluticasone (FLONASE) 50 MCG/ACT nasal spray Place 2 sprays into both nostrils daily. 01/05/18 01/05/19 Yes Enid Derry, PA-C  Lancets (ACCU-CHEK SOFT TOUCH) lancets Check FSBS once a day on average, dx E11.9, LON 99 months 02/15/17  Yes Lada, Janit Bern, MD  lisinopril (PRINIVIL,ZESTRIL) 20 MG tablet TAKE 1 TABLET BY MOUTH DAILY 11/01/17  Yes Lada, Janit Bern, MD  metFORMIN (GLUCOPHAGE) 500 MG tablet Take 1 tablet (500 mg total) by mouth daily. (best if taking 15-20 minutes before biggest meal of the day) 11/09/16  Yes Lada, Janit Bern, MD  omega-3 acid ethyl esters (LOVAZA) 1 g capsule Take 2 capsules (2 g total) by mouth 2 (two) times daily. (this replaces your fish oil) 11/15/16  Yes Lada, Janit Bern, MD  benzonatate (TESSALON PERLES) 100 MG capsule Take 1 capsule (100 mg total) by mouth 3 (three) times daily as needed for cough. Patient not taking: Reported on 06/27/2018 01/05/18 01/05/19  Loreta Ave,  Morrie SheldonAshley, PA-C  cyclobenzaprine (FLEXERIL) 10 MG tablet Take 1 tablet (10 mg total) by mouth 3 (three) times daily as needed. 07/08/18   Joni ReiningSmith, Osmin Welz K, PA-C  diclofenac sodium (VOLTAREN) 1 % GEL Apply 4 g topically 4 (four) times daily. Patient not taking: Reported on 06/27/2018 04/02/18   Andreas Ohmukov-Yual, Magdalene S, NP  hydrocortisone 1 % ointment Apply 1 application topically 2 (two) times daily. Apply to rash on  back of the head bid x 1 week Patient not taking: Reported on 06/27/2018 03/24/18   Tukov-Yual, Alroy BailiffMagdalene S, NP  ketorolac (TORADOL) 10 MG tablet Take 1 tablet (10 mg total) by mouth every 8 (eight) hours. 06/07/18   Menshew, Charlesetta IvoryJenise V Bacon, PA-C  meloxicam (MOBIC) 15 MG tablet Take 1 tablet (15 mg total) by mouth daily. 07/08/18   Joni ReiningSmith, Mikaiya Tramble K, PA-C  predniSONE (DELTASONE) 50 MG tablet Take one 50 mg tablet once daily for the next five days. Patient not taking: Reported on 06/27/2018 06/14/18   Orvil FeilWoods, Jaclyn M, PA-C    Allergies Patient has no known allergies.  Family History  Problem Relation Age of Onset  . Diabetes Father   . COPD Mother     Social History Social History   Tobacco Use  . Smoking status: Former Games developermoker  . Smokeless tobacco: Never Used  Substance Use Topics  . Alcohol use: No    Alcohol/week: 0.0 standard drinks  . Drug use: No    Review of Systems Constitutional: No fever/chills Eyes: No visual changes. ENT: No sore throat. Cardiovascular: Denies chest pain. Respiratory: Denies shortness of breath. Gastrointestinal: No abdominal pain.  No nausea, no vomiting.  No diarrhea.  No constipation. Genitourinary: Negative for dysuria. Musculoskeletal: Positive for back pain. Skin: Negative for rash. Neurological: Negative for headaches, focal weakness or numbness.  ____________________________________________   PHYSICAL EXAM:  VITAL SIGNS: ED Triage Vitals  Enc Vitals Group     BP 07/08/18 1100 (!) 143/91     Pulse Rate 07/08/18 1100 69     Resp 07/08/18 1100 20     Temp 07/08/18 1100 98.1 F (36.7 C)     Temp Source 07/08/18 1100 Oral     SpO2 07/08/18 1100 99 %     Weight 07/08/18 1101 240 lb (108.9 kg)     Height 07/08/18 1101 5\' 3"  (1.6 m)     Head Circumference --      Peak Flow --      Pain Score 07/08/18 1101 5     Pain Loc --      Pain Edu? --      Excl. in GC? --    Constitutional: Alert and oriented. Well appearing and in no acute  distress. Eyes: Conjunctivae are normal. PERRL. EOMI. Head: Atraumatic. Nose: No congestion/rhinnorhea. Mouth/Throat: Mucous membranes are moist.  Oropharynx non-erythematous. Neck: No stridor.  No cervical spine tenderness to palpation. Hematological/Lymphatic/Immunilogical: No cervical lymphadenopathy. Cardiovascular: Normal rate, regular rhythm. Grossly normal heart sounds.  Good peripheral circulation. Respiratory: Normal respiratory effort.  No retractions. Lungs CTAB. Gastrointestinal: Truncal obesity.  Soft and nontender. No distention. No abdominal bruits. No CVA tenderness. Musculoskeletal: No obvious deformity to the lumbar spine.  Patient has decreased range of motion with flexion limited by complaint of pain and truncal obesity.  No lower extremity tenderness nor edema.  No joint effusions. Neurologic:  Normal speech and language. No gross focal neurologic deficits are appreciated. No gait instability. Skin:  Skin is warm, dry and intact. No rash noted. Psychiatric:  Mood and affect are normal. Speech and behavior are normal.  ____________________________________________   LABS (all labs ordered are listed, but only abnormal results are displayed)  Labs Reviewed  URINALYSIS, ROUTINE W REFLEX MICROSCOPIC - Abnormal; Notable for the following components:      Result Value   Color, Urine YELLOW (*)    APPearance CLEAR (*)    All other components within normal limits   ____________________________________________  EKG   ____________________________________________  RADIOLOGY  ED MD interpretation:    Official radiology report(s): No results found.  ____________________________________________   PROCEDURES  Procedure(s) performed: None  Procedures  Critical Care performed: No  ____________________________________________   INITIAL IMPRESSION / ASSESSMENT AND PLAN / ED COURSE  As part of my medical decision making, I reviewed the following data within the  electronic MEDICAL RECORD NUMBER    Chronic low back pain.  Patient given discharge care instructions.  [facility]      ____________________________________________   FINAL CLINICAL IMPRESSION(S) / ED DIAGNOSES  Final diagnoses:  Other chronic back pain     ED Discharge Orders         Ordered    meloxicam (MOBIC) 15 MG tablet  Daily     07/08/18 1257    cyclobenzaprine (FLEXERIL) 10 MG tablet  3 times daily PRN     07/08/18 1257           Note:  This document was prepared using Dragon voice recognition software and may include unintentional dictation errors.    Joni Reining, PA-C 07/08/18 1305    Phineas Semen, MD 07/08/18 1328

## 2018-07-08 NOTE — ED Notes (Signed)
Says he has back pain.  Worse on right, but both sides on flanks for about a month.  Says it just isnt getting better and he thinks he may have kidney infection.  No fever, but say he feels releif after urinating.

## 2018-07-08 NOTE — ED Triage Notes (Signed)
Low back pain x 1 month. Denies fall or injury at onset. States hurts worse when he stands.

## 2018-08-08 ENCOUNTER — Ambulatory Visit: Payer: Self-pay | Admitting: Adult Health

## 2018-08-15 ENCOUNTER — Encounter: Payer: Self-pay | Admitting: Adult Health

## 2018-08-15 ENCOUNTER — Ambulatory Visit: Payer: Self-pay | Admitting: Adult Health

## 2018-08-15 VITALS — BP 135/79 | HR 71 | Temp 98.1°F | Ht 63.75 in | Wt 242.5 lb

## 2018-08-15 DIAGNOSIS — I10 Essential (primary) hypertension: Secondary | ICD-10-CM

## 2018-08-15 DIAGNOSIS — E1159 Type 2 diabetes mellitus with other circulatory complications: Secondary | ICD-10-CM

## 2018-08-15 DIAGNOSIS — M545 Low back pain, unspecified: Secondary | ICD-10-CM

## 2018-08-15 DIAGNOSIS — E119 Type 2 diabetes mellitus without complications: Secondary | ICD-10-CM

## 2018-08-15 DIAGNOSIS — E1169 Type 2 diabetes mellitus with other specified complication: Secondary | ICD-10-CM

## 2018-08-15 DIAGNOSIS — G8929 Other chronic pain: Secondary | ICD-10-CM

## 2018-08-15 DIAGNOSIS — E1142 Type 2 diabetes mellitus with diabetic polyneuropathy: Secondary | ICD-10-CM

## 2018-08-15 DIAGNOSIS — E785 Hyperlipidemia, unspecified: Secondary | ICD-10-CM

## 2018-08-15 MED ORDER — ATORVASTATIN CALCIUM 10 MG PO TABS: 10.0000 mg | ORAL_TABLET | Freq: Every day | ORAL | 2 refills | Status: DC

## 2018-08-15 MED ORDER — VITAMIN D 1000 UNITS PO TABS: 1000.0000 [IU] | ORAL_TABLET | Freq: Every day | ORAL | 2 refills | Status: DC

## 2018-08-15 MED ORDER — FLUTICASONE PROPIONATE 50 MCG/ACT NA SUSP: 2.0000 | Freq: Every day | NASAL | 0 refills | Status: DC

## 2018-08-15 MED ORDER — METFORMIN HCL 500 MG PO TABS: 500.0000 mg | ORAL_TABLET | Freq: Every day | ORAL | 2 refills | Status: DC

## 2018-08-15 MED ORDER — LISINOPRIL 20 MG PO TABS: 20.0000 mg | ORAL_TABLET | Freq: Every day | ORAL | 2 refills | Status: DC

## 2018-08-15 MED ORDER — ASPIRIN EC 81 MG PO TBEC: 81.0000 mg | DELAYED_RELEASE_TABLET | Freq: Every day | ORAL | 2 refills | Status: DC

## 2018-08-15 MED ORDER — OMEGA-3-ACID ETHYL ESTERS 1 G PO CAPS: 2.0000 g | ORAL_CAPSULE | Freq: Two times a day (BID) | ORAL | 2 refills | Status: DC

## 2018-08-15 NOTE — Progress Notes (Signed)
Patient: Nathaniel Webb Male    DOB: 07-02-1966   52 y.o.   MRN: [id number] Visit Date: 08/15/2018  Today's Provider: Deforest Hoyles, NP   Chief Complaint  Patient presents with  . Follow-up   Subjective:    HPI 52 y/o male presenting for f/u hypertension, DM and back pain. He reports doing well except for bilateral knee pain. He does not check his blood sugar at home as he is unable to operate the meter that he was given. His BP is well controlled today. C/O bilateral knee pain that is chronic. Describes pain as achy and worse in the morning. Currently 3/10 in intensity. No associated symptoms.He reports taking all medications as prescribed but reports poor diet adherence. He denies chest pain, palpitations, dizziness and headache    No Known Allergies Previous Medications   ASPIRIN EC 81 MG TABLET    Take 1 tablet (81 mg total) by mouth daily.   ATORVASTATIN (LIPITOR) 10 MG TABLET    Take 1 tablet (10 mg total) by mouth at bedtime.   BENZONATATE (TESSALON PERLES) 100 MG CAPSULE    Take 1 capsule (100 mg total) by mouth 3 (three) times daily as needed for cough.   BLOOD GLUC METER DISP-STRIPS (BLOOD GLUCOSE METER DISPOSABLE) DEVI    For Contour Check FSBS once a day on average, dx E11.9, LON 99 months   BLOOD GLUCOSE MONITORING SUPPL (CONTOUR BLOOD GLUCOSE SYSTEM) DEVI    Check FSBS once a day on average, dx E11.9, LON 99 months   CHOLECALCIFEROL (VITAMIN D) 1000 UNITS TABLET    Take 1,000 Units by mouth daily.   CYCLOBENZAPRINE (FLEXERIL) 10 MG TABLET    Take 1 tablet (10 mg total) by mouth 3 (three) times daily as needed.   DICLOFENAC SODIUM (VOLTAREN) 1 % GEL    Apply 4 g topically 4 (four) times daily.   FLUTICASONE (FLONASE) 50 MCG/ACT NASAL SPRAY    Place 2 sprays into both nostrils daily.   HYDROCORTISONE 1 % OINTMENT    Apply 1 application topically 2 (two) times daily. Apply to rash on back of the head bid x 1 week   KETOROLAC (TORADOL) 10 MG TABLET    Take 1 tablet (10  mg total) by mouth every 8 (eight) hours.   LANCETS (ACCU-CHEK SOFT TOUCH) LANCETS    Check FSBS once a day on average, dx E11.9, LON 99 months   LISINOPRIL (PRINIVIL,ZESTRIL) 20 MG TABLET    TAKE 1 TABLET BY MOUTH DAILY   MELOXICAM (MOBIC) 15 MG TABLET    Take 1 tablet (15 mg total) by mouth daily.   METFORMIN (GLUCOPHAGE) 500 MG TABLET    Take 1 tablet (500 mg total) by mouth daily. (best if taking 15-20 minutes before biggest meal of the day)   OMEGA-3 ACID ETHYL ESTERS (LOVAZA) 1 G CAPSULE    Take 2 capsules (2 g total) by mouth 2 (two) times daily. (this replaces your fish oil)   PREDNISONE (DELTASONE) 50 MG TABLET    Take one 50 mg tablet once daily for the next five days.    Review of Systems  Constitutional: Negative.   Respiratory: Negative.   Cardiovascular: Negative.   Gastrointestinal: Positive for abdominal distention (central obesity).  Endocrine: Negative.   Musculoskeletal: Positive for arthralgias (BL KNEE PAIN). Negative for joint swelling.  Skin: Negative.     Social History   Tobacco Use  . Smoking status: Former Research scientist (life sciences)  . Smokeless tobacco: Never Used  Substance Use Topics  . Alcohol use: No    Alcohol/week: 0.0 standard drinks   Objective:   BP 135/79 (BP Location: Left Arm, Patient Position: Sitting)   Pulse 71   Temp 98.1 F (36.7 C) (Oral)   Ht 5' 3.75" (1.619 m)   Wt 242 lb 8 oz (110 kg)   BMI 41.95 kg/m   Physical Exam  Constitutional: He is oriented to person, place, and time. He appears well-developed and well-nourished.  Cardiovascular: Normal rate, regular rhythm, normal heart sounds and intact distal pulses.  Pulmonary/Chest: Effort normal and breath sounds normal.  Musculoskeletal: Normal range of motion. He exhibits tenderness (Mild pain with flexion and extension of the knees).  Neurological: He is alert and oriented to person, place, and time.  Skin: Skin is warm and dry.  Nursing note and vitals reviewed.       Assessment & Plan:   1. Type 2 diabetes, controlled, with peripheral neuropathy (HCC) Pain improved with glycemic control. Continue current regimen. Will obtain repeat labs - HgB A1c - Comp Met (CMET) - CBC w/Diff - Magnesium - Phosphorus - PSA - Lipid Profile  2. Morbid obesity ([place]) Continue weight loss efforts. Will repeat routine labs - CBC w/Diff - Phosphorus - Lipid Profile  3. Chronic low back pain without sciatica, unspecified back pain laterality Continue prn tylenol and stretch exercises. RTC for worsening symptoms  4. Hypertension associated with diabetes ([place]) Continue current medications. Low salt diet emphasized  5. Hyperlipidemia associated with type 2 diabetes mellitus ([place]) Continue low fa diet  6. Controlled type 2 diabetes mellitus without complication, without long-term current use of insulin (HCC) Continue current medications - metFORMIN (GLUCOPHAGE) 500 MG tablet; Take 1 tablet (500 mg total) by mouth daily. (best if taking 15-20 minutes before biggest meal of the day)  Dispense: 90 tablet; Refill: 2 - aspirin EC 81 MG tablet; Take 1 tablet (81 mg total) by mouth daily.  Dispense: 90 tablet; Refill: [place], NP   [facility]

## 2018-08-16 LAB — CBC WITH DIFFERENTIAL/PLATELET
BASOS: 1 %
Basophils Absolute: 0 10*3/uL (ref 0.0–0.2)
EOS (ABSOLUTE): 0.1 10*3/uL (ref 0.0–0.4)
EOS: 2 %
HEMATOCRIT: 41.5 % (ref 37.5–51.0)
HEMOGLOBIN: 13.7 g/dL (ref 13.0–17.7)
IMMATURE GRANS (ABS): 0 10*3/uL (ref 0.0–0.1)
IMMATURE GRANULOCYTES: 0 %
LYMPHS: 26 %
Lymphocytes Absolute: 2.1 10*3/uL (ref 0.7–3.1)
MCH: 26.3 pg — AB (ref 26.6–33.0)
MCHC: 33 g/dL (ref 31.5–35.7)
MCV: 80 fL (ref 79–97)
Monocytes Absolute: 0.6 10*3/uL (ref 0.1–0.9)
Monocytes: 8 %
NEUTROS PCT: 63 %
Neutrophils Absolute: 5.1 10*3/uL (ref 1.4–7.0)
Platelets: 263 10*3/uL (ref 150–450)
RBC: 5.21 x10E6/uL (ref 4.14–5.80)
RDW: 14.6 % (ref 12.3–15.4)
WBC: 8 10*3/uL (ref 3.4–10.8)

## 2018-08-16 LAB — COMPREHENSIVE METABOLIC PANEL
A/G RATIO: 1.2 (ref 1.2–2.2)
ALT: 37 IU/L (ref 0–44)
AST: 35 IU/L (ref 0–40)
Albumin: 4.4 g/dL (ref 3.5–5.5)
Alkaline Phosphatase: 88 IU/L (ref 39–117)
BUN/Creatinine Ratio: 14 (ref 9–20)
BUN: 13 mg/dL (ref 6–24)
Bilirubin Total: 0.3 mg/dL (ref 0.0–1.2)
CALCIUM: 9.7 mg/dL (ref 8.7–10.2)
CO2: 24 mmol/L (ref 20–29)
CREATININE: 0.95 mg/dL (ref 0.76–1.27)
Chloride: 100 mmol/L (ref 96–106)
GFR, EST AFRICAN AMERICAN: 107 mL/min/{1.73_m2} (ref >59)
GFR, EST NON AFRICAN AMERICAN: 93 mL/min/{1.73_m2} (ref >59)
GLOBULIN, TOTAL: 3.6 g/dL (ref 1.5–4.5)
Glucose: 110 mg/dL — ABNORMAL HIGH (ref 65–99)
Potassium: 4.6 mmol/L (ref 3.5–5.2)
Sodium: 143 mmol/L (ref 134–144)
TOTAL PROTEIN: 8 g/dL (ref 6.0–8.5)

## 2018-08-16 LAB — LIPID PANEL
CHOLESTEROL TOTAL: 126 mg/dL (ref 100–199)
Chol/HDL Ratio: 5.3 ratio — ABNORMAL HIGH (ref 0.0–5.0)
HDL: 24 mg/dL — AB (ref >39)
LDL Calculated: 70 mg/dL (ref 0–99)
TRIGLYCERIDES: 161 mg/dL — AB (ref 0–149)
VLDL CHOLESTEROL CAL: 32 mg/dL (ref 5–40)

## 2018-08-16 LAB — PSA: PROSTATE SPECIFIC AG, SERUM: 1.2 ng/mL (ref 0.0–4.0)

## 2018-08-16 LAB — HEMOGLOBIN A1C
ESTIMATED AVERAGE GLUCOSE: 134 mg/dL
HEMOGLOBIN A1C: 6.3 % — AB (ref 4.8–5.6)

## 2018-08-16 LAB — PHOSPHORUS: Phosphorus: 3.4 mg/dL (ref 2.5–4.5)

## 2018-08-16 LAB — MAGNESIUM: Magnesium: 2.1 mg/dL (ref 1.6–2.3)

## 2018-08-24 ENCOUNTER — Encounter: Payer: Self-pay | Admitting: Adult Health

## 2018-08-29 ENCOUNTER — Ambulatory Visit: Payer: Self-pay | Admitting: Ophthalmology

## 2018-08-29 ENCOUNTER — Other Ambulatory Visit: Payer: Self-pay | Admitting: Ophthalmology

## 2018-08-29 LAB — HM DIABETES EYE EXAM

## 2018-11-11 ENCOUNTER — Emergency Department
Admission: EM | Admit: 2018-11-11 | Discharge: 2018-11-11 | Disposition: A | Discharge status: Home / Self Care | Payer: Self-pay | Attending: Emergency Medicine | Admitting: Emergency Medicine

## 2018-11-11 ENCOUNTER — Other Ambulatory Visit: Payer: Self-pay

## 2018-11-11 ENCOUNTER — Emergency Department: Payer: Self-pay

## 2018-11-11 ENCOUNTER — Encounter: Payer: Self-pay | Admitting: Emergency Medicine

## 2018-11-11 DIAGNOSIS — E119 Type 2 diabetes mellitus without complications: Secondary | ICD-10-CM | POA: Insufficient documentation

## 2018-11-11 DIAGNOSIS — I10 Essential (primary) hypertension: Secondary | ICD-10-CM | POA: Insufficient documentation

## 2018-11-11 DIAGNOSIS — M79604 Pain in right leg: Secondary | ICD-10-CM | POA: Insufficient documentation

## 2018-11-11 DIAGNOSIS — Z87891 Personal history of nicotine dependence: Secondary | ICD-10-CM | POA: Insufficient documentation

## 2018-11-11 MED ORDER — CYCLOBENZAPRINE HCL 10 MG PO TABS: 10.0000 mg | ORAL_TABLET | Freq: Three times a day (TID) | ORAL | 0 refills | Status: DC | PRN

## 2018-11-11 NOTE — ED Triage Notes (Signed)
First Nurse Note:  C/O right lower leg pain and swelling and knee pain x 1 week.  AAOx3.  Skin warm and dry. NAD

## 2018-11-11 NOTE — ED Triage Notes (Signed)
Says for about a week he has pain behind right knee and in calf.  Denies joint pain in knee.

## 2018-11-11 NOTE — ED Notes (Signed)
AAOx3.  Skin warm and dry.  NAD 

## 2018-11-11 NOTE — ED Provider Notes (Signed)
[facility] Emergency Department Provider Note       Time seen: ----------------------------------------- 3:34 PM on 11/11/2018 -----------------------------------------   I have reviewed the triage vital signs and the nursing notes.  HISTORY   Chief Complaint Leg Pain    HPI Nathaniel Webb is a 52 y.o. male with a history of diabetes, hyperlipidemia, hypertension who presents to the ED for pain behind his right knee and calf.  He denies any joint pain.  Symptoms have been going on for the last week.  Patient describes that he is walking more than normal, pain started after he was walking out in the cold weather.  Past Medical History:  Diagnosis Date  . Diabetes mellitus without complication (HCC)   . High triglycerides 05/09/2016  . Hypertension   . Low HDL (under 40) 05/09/2016  . Obesity, Class II, BMI 35-39.9, with comorbidity (HCC) 01/04/2016    Patient Active Problem List   Diagnosis Date Noted  . Osteoarthritis of right knee 02/15/2017  . Hand cramps 11/09/2016  . Tinea pedis 06/04/2016  . Low HDL (under 40) 05/09/2016  . High triglycerides 05/09/2016  . Medication monitoring encounter 05/03/2016  . Controlled type 2 diabetes mellitus without complication, without long-term current use of insulin (HCC) 01/04/2016  . Needs flu shot 01/04/2016  . Morbid obesity (HCC) 01/04/2016  . Screening for STD (sexually transmitted disease) 01/04/2016  . Encounter for cholesteral screening for cardiovascular disease 01/04/2016  . AD (atopic dermatitis) 06/09/2015  . Chronic low back pain 06/09/2015  . Heel pain 06/09/2015  . Hypertension 06/09/2015  . Type 2 diabetes, controlled, with peripheral neuropathy (HCC) 12/23/2014    History reviewed. No pertinent surgical history.  Allergies Patient has no known allergies.  Social History Social History   Tobacco Use  . Smoking status: Former Games developermoker  . Smokeless tobacco: Never Used  Substance Use  Topics  . Alcohol use: No    Alcohol/week: 0.0 standard drinks  . Drug use: No   Review of Systems Constitutional: Negative for fever. Cardiovascular: Negative for chest pain. Respiratory: Negative for shortness of breath. Gastrointestinal: Negative for abdominal pain, vomiting and diarrhea. Musculoskeletal: Positive for right leg pain Skin: Negative for rash. Neurological: Negative for headaches, focal weakness or numbness.  All systems negative/normal/unremarkable except as stated in the HPI  ____________________________________________   PHYSICAL EXAM:  VITAL SIGNS: ED Triage Vitals [11/11/18 1335]  Enc Vitals Group     BP 122/70     Pulse Rate 69     Resp 16     Temp 97.9 F (36.6 C)     Temp Source Oral     SpO2 95 %     Weight 234 lb (106.1 kg)     Height 5\' 5"  (1.651 m)     Head Circumference      Peak Flow      Pain Score 6     Pain Loc      Pain Edu?      Excl. in GC?    Constitutional: Alert and oriented. Well appearing and in no distress. Musculoskeletal: Nontender with normal range of motion in extremities. No lower extremity tenderness nor edema. Neurologic:  Normal speech and language. No gross focal neurologic deficits are appreciated.  Skin:  Skin is warm, dry and intact. No rash noted. Psychiatric: Mood and affect are normal. Speech and behavior are normal.  ____________________________________________  ED COURSE:  As part of my medical decision making, I reviewed the following data within  the electronic MEDICAL RECORD NUMBER History obtained from family if available, nursing notes, old chart and ekg, as well as notes from prior ED visits. Patient presented for right leg pain, we will assess with labs and imaging as indicated at this time.   Procedures ____________________________________________   RADIOLOGY  Right lower extremity ultrasound is negative for DVT  ____________________________________________  DIFFERENTIAL DIAGNOSIS    Musculoskeletal pain, spasm, DVT  FINAL ASSESSMENT AND PLAN  Musculoskeletal pain   Plan: The patient had presented for right leg pain. Patient's imaging was unremarkable.  He will be discharged with Flexeril and is cleared for outpatient follow-up.   [name], MD   Note: This note was generated in part or whole with voice recognition software. Voice recognition is usually quite accurate but there are transcription errors that can and very often do occur. I apologize for any typographical errors that were not detected and corrected.     [name], [name], MD 11/11/18 [phone]

## 2018-11-11 NOTE — ED Notes (Signed)
Pt c/o right LE swelling and pain behind the right knee for the past 2 weeks. States he has been walking more recently. denies any injury or redness to the area.

## 2018-11-14 ENCOUNTER — Ambulatory Visit: Payer: Self-pay | Admitting: Gerontology

## 2018-11-14 ENCOUNTER — Ambulatory Visit: Payer: Self-pay | Admitting: Adult Health

## 2018-11-20 ENCOUNTER — Other Ambulatory Visit: Payer: Self-pay

## 2018-11-20 ENCOUNTER — Ambulatory Visit: Payer: Self-pay | Admitting: Gerontology

## 2018-11-20 ENCOUNTER — Encounter: Payer: Self-pay | Admitting: Gerontology

## 2018-11-20 VITALS — BP 135/89 | HR 70 | Ht 65.0 in | Wt 242.6 lb

## 2018-11-20 DIAGNOSIS — M25471 Effusion, right ankle: Secondary | ICD-10-CM

## 2018-11-20 DIAGNOSIS — E119 Type 2 diabetes mellitus without complications: Secondary | ICD-10-CM

## 2018-11-20 DIAGNOSIS — I10 Essential (primary) hypertension: Secondary | ICD-10-CM

## 2018-11-20 DIAGNOSIS — B353 Tinea pedis: Secondary | ICD-10-CM

## 2018-11-20 DIAGNOSIS — M79672 Pain in left foot: Secondary | ICD-10-CM

## 2018-11-20 MED ORDER — METFORMIN HCL 500 MG PO TABS: 750.0000 mg | ORAL_TABLET | Freq: Two times a day (BID) | ORAL | 2 refills | Status: DC

## 2018-11-20 MED ORDER — CLOTRIMAZOLE 1 % EX CREA
1.0000 | TOPICAL_CREAM | Freq: Two times a day (BID) | CUTANEOUS | 0 refills | Status: DC
Start: 2019-02-12 — End: 2019-02-25

## 2018-11-20 NOTE — Patient Instructions (Signed)
Fat and Cholesterol Restricted Eating Plan Getting too much fat and cholesterol in your diet may cause health problems. Choosing the right foods helps keep your fat and cholesterol at normal levels. This can keep you from getting certain diseases. Your doctor may recommend an eating plan that includes:  Total fat: ______% or less of total calories a day.  Saturated fat: ______% or less of total calories a day.  Cholesterol: less than _________mg a day.  Fiber: ______g a day. What are tips for following this plan? Meal planning  At meals, divide your plate into four equal parts: ? Fill one-half of your plate with vegetables and green salads. ? Fill one-fourth of your plate with whole grains. ? Fill one-fourth of your plate with low-fat (lean) protein foods.  Eat fish that is high in omega-3 fats at least two times a week. This includes mackerel, tuna, sardines, and salmon.  Eat foods that are high in fiber, such as whole grains, beans, apples, broccoli, carrots, peas, and barley. General tips   Work with your doctor to lose weight if you need to.  Avoid: ? Foods with added sugar. ? Fried foods. ? Foods with partially hydrogenated oils.  Limit alcohol intake to no more than 1 drink a day for nonpregnant women and 2 drinks a day for men. One drink equals 12 oz of beer, 5 oz of wine, or 1 oz of hard liquor. Reading food labels  Check food labels for: ? Trans fats. ? Partially hydrogenated oils. ? Saturated fat (g) in each serving. ? Cholesterol (mg) in each serving. ? Fiber (g) in each serving.  Choose foods with healthy fats, such as: ? Monounsaturated fats. ? Polyunsaturated fats. ? Omega-3 fats.  Choose grain products that have whole grains. Look for the word "whole" as the first word in the ingredient list. Cooking  Cook foods using low-fat methods. These include baking, boiling, grilling, and broiling.  Eat more home-cooked foods. Eat at restaurants and buffets  less often.  Avoid cooking using saturated fats, such as butter, cream, palm oil, palm kernel oil, and coconut oil. Recommended foods  Fruits  All fresh, canned (in natural juice), or frozen fruits. Vegetables  Fresh or frozen vegetables (raw, steamed, roasted, or grilled). Green salads. Grains  Whole grains, such as whole wheat or whole grain breads, crackers, cereals, and pasta. Unsweetened oatmeal, bulgur, barley, quinoa, or brown rice. Corn or whole wheat flour tortillas. Meats and other protein foods  Ground beef (85% or leaner), grass-fed beef, or beef trimmed of fat. Skinless chicken or turkey. Ground chicken or turkey. Pork trimmed of fat. All fish and seafood. Egg whites. Dried beans, peas, or lentils. Unsalted nuts or seeds. Unsalted canned beans. Nut butters without added sugar or oil. Dairy  Low-fat or nonfat dairy products, such as skim or 1% milk, 2% or reduced-fat cheeses, low-fat and fat-free ricotta or cottage cheese, or plain low-fat and nonfat yogurt. Fats and oils  Tub margarine without trans fats. Light or reduced-fat mayonnaise and salad dressings. Avocado. Olive, canola, sesame, or safflower oils. The items listed above may not be a complete list of foods and beverages you can eat. Contact a dietitian for more information. Foods to avoid Fruits  Canned fruit in heavy syrup. Fruit in cream or butter sauce. Fried fruit. Vegetables  Vegetables cooked in cheese, cream, or butter sauce. Fried vegetables. Grains  White bread. White pasta. White rice. Cornbread. Bagels, pastries, and croissants. Crackers and snack foods that contain trans fat   and hydrogenated oils. Meats and other protein foods  Fatty cuts of meat. Ribs, chicken wings, bacon, sausage, bologna, salami, chitterlings, fatback, hot dogs, bratwurst, and packaged lunch meats. Liver and organ meats. Whole eggs and egg yolks. Chicken and [place] with skin. Fried meat. Dairy  Whole or 2% milk, cream,  half-and-half, and cream cheese. Whole milk cheeses. Whole-fat or sweetened yogurt. Full-fat cheeses. Nondairy creamers and whipped toppings. Processed cheese, cheese spreads, and cheese curds. Beverages  Alcohol. Sugar-sweetened drinks such as sodas, lemonade, and fruit drinks. Fats and oils  Butter, stick margarine, lard, shortening, ghee, or bacon fat. Coconut, palm kernel, and palm oils. Sweets and desserts  Corn syrup, sugars, honey, and molasses. Candy. Jam and jelly. Syrup. Sweetened cereals. Cookies, pies, cakes, donuts, muffins, and ice cream. The items listed above may not be a complete list of foods and beverages you should avoid. Contact a dietitian for more information. Summary  Choosing the right foods helps keep your fat and cholesterol at normal levels. This can keep you from getting certain diseases.  At meals, fill one-half of your plate with vegetables and green salads.  Eat high-fiber foods, like whole grains, beans, apples, carrots, peas, and barley.  Limit added sugar, saturated fats, alcohol, and fried foods. This information is not intended to replace advice given to you by your health care provider. Make sure you discuss any questions you have with your health care provider. Document Released: 04/30/2012 Document Revised: 07/03/2018 Document Reviewed: 07/17/2017 Elsevier Interactive Patient Education  2019 [place]. Carbohydrate Counting for Diabetes Mellitus, Adult  Carbohydrate counting is a method of keeping track of how many carbohydrates you eat. Eating carbohydrates naturally increases the amount of sugar (glucose) in the blood. Counting how many carbohydrates you eat helps keep your blood glucose within normal limits, which helps you manage your diabetes (diabetes mellitus). It is important to know how many carbohydrates you can safely have in each meal. This is different for every person. A diet and nutrition specialist (registered dietitian) can help  you make a meal plan and calculate how many carbohydrates you should have at each meal and snack. Carbohydrates are found in the following foods:  Grains, such as breads and cereals.  Dried beans and soy products.  Starchy vegetables, such as potatoes, peas, and corn.  Fruit and fruit juices.  Milk and yogurt.  Sweets and snack foods, such as cake, cookies, candy, chips, and soft drinks. How do I count carbohydrates? There are two ways to count carbohydrates in food. You can use either of the methods or a combination of both. Reading "Nutrition Facts" on packaged food The "Nutrition Facts" list is included on the labels of almost all packaged foods and beverages in the [place] It includes:  The serving size.  Information about nutrients in each serving, including the grams (g) of carbohydrate per serving. To use the "Nutrition Facts":  Decide how many servings you will have.  Multiply the number of servings by the number of carbohydrates per serving.  The resulting number is the total amount of carbohydrates that you will be having. Learning standard serving sizes of other foods When you eat carbohydrate foods that are not packaged or do not include "Nutrition Facts" on the label, you need to measure the servings in order to count the amount of carbohydrates:  Measure the foods that you will eat with a food scale or measuring cup, if needed.  Decide how many standard-size servings you will eat.  Multiply the number  of servings by 15. Most carbohydrate-rich foods have about 15 g of carbohydrates per serving. ? For example, if you eat 8 oz (170 g) of strawberries, you will have eaten 2 servings and 30 g of carbohydrates (2 servings x 15 g = 30 g).  For foods that have more than one food mixed, such as soups and casseroles, you must count the carbohydrates in each food that is included. The following list contains standard serving sizes of common carbohydrate-rich foods. Each of  these servings has about 15 g of carbohydrates:   hamburger bun or  English muffin.   oz (15 mL) syrup.   oz (14 g) jelly.  1 slice of bread.  1 six-inch tortilla.  3 oz (85 g) cooked rice or pasta.  4 oz (113 g) cooked dried beans.  4 oz (113 g) starchy vegetable, such as peas, corn, or potatoes.  4 oz (113 g) hot cereal.  4 oz (113 g) mashed potatoes or  of a large baked potato.  4 oz (113 g) canned or frozen fruit.  4 oz (120 mL) fruit juice.  4-6 crackers.  6 chicken nuggets.  6 oz (170 g) unsweetened dry cereal.  6 oz (170 g) plain fat-free yogurt or yogurt sweetened with artificial sweeteners.  8 oz (240 mL) milk.  8 oz (170 g) fresh fruit or one small piece of fruit.  24 oz (680 g) popped popcorn. Example of carbohydrate counting Sample meal  3 oz (85 g) chicken breast.  6 oz (170 g) brown rice.  4 oz (113 g) corn.  8 oz (240 mL) milk.  8 oz (170 g) strawberries with sugar-free whipped topping. Carbohydrate calculation 1. Identify the foods that contain carbohydrates: ? Rice. ? Corn. ? Milk. ? Strawberries. 2. Calculate how many servings you have of each food: ? 2 servings rice. ? 1 serving corn. ? 1 serving milk. ? 1 serving strawberries. 3. Multiply each number of servings by 15 g: ? 2 servings rice x 15 g = 30 g. ? 1 serving corn x 15 g = 15 g. ? 1 serving milk x 15 g = 15 g. ? 1 serving strawberries x 15 g = 15 g. 4. Add together all of the amounts to find the total grams of carbohydrates eaten: ? 30 g + 15 g + 15 g + 15 g = 75 g of carbohydrates total. Summary  Carbohydrate counting is a method of keeping track of how many carbohydrates you eat.  Eating carbohydrates naturally increases the amount of sugar (glucose) in the blood.  Counting how many carbohydrates you eat helps keep your blood glucose within normal limits, which helps you manage your diabetes.  A diet and nutrition specialist (registered dietitian) can help  you make a meal plan and calculate how many carbohydrates you should have at each meal and snack. This information is not intended to replace advice given to you by your health care provider. Make sure you discuss any questions you have with your health care provider. Document Released: 10/30/2005 Document Revised: 05/09/2017 Document Reviewed: 04/12/2016 Elsevier Interactive Patient Education  2019 [employer] DASH Eating Plan DASH stands for "Dietary Approaches to Stop Hypertension." The DASH eating plan is a healthy eating plan that has been shown to reduce high blood pressure (hypertension). It may also reduce your risk for type 2 diabetes, heart disease, and stroke. The DASH eating plan may also help with weight loss. What are tips for following this plan?  General  guidelines  Avoid eating more than 2,300 mg (milligrams) of salt (sodium) a day. If you have hypertension, you may need to reduce your sodium intake to 1,500 mg a day.  Limit alcohol intake to no more than 1 drink a day for nonpregnant women and 2 drinks a day for men. One drink equals 12 oz of beer, 5 oz of wine, or 1 oz of hard liquor.  Work with your health care provider to maintain a healthy body weight or to lose weight. Ask what an ideal weight is for you.  Get at least 30 minutes of exercise that causes your heart to beat faster (aerobic exercise) most days of the week. Activities may include walking, swimming, or biking.  Work with your health care provider or diet and nutrition specialist (dietitian) to adjust your eating plan to your individual calorie needs. Reading food labels   Check food labels for the amount of sodium per serving. Choose foods with less than 5 percent of the Daily Value of sodium. Generally, foods with less than 300 mg of sodium per serving fit into this eating plan.  To find whole grains, look for the word "whole" as the first word in the ingredient list. Shopping  Buy products labeled  as "low-sodium" or "no salt added."  Buy fresh foods. Avoid canned foods and premade or frozen meals. Cooking  Avoid adding salt when cooking. Use salt-free seasonings or herbs instead of table salt or sea salt. Check with your health care provider or pharmacist before using salt substitutes.  Do not fry foods. Cook foods using healthy methods such as baking, boiling, grilling, and broiling instead.  Cook with heart-healthy oils, such as olive, canola, soybean, or sunflower oil. Meal planning  Eat a balanced diet that includes: ? 5 or more servings of fruits and vegetables each day. At each meal, try to fill half of your plate with fruits and vegetables. ? Up to 6-8 servings of whole grains each day. ? Less than 6 oz of lean meat, poultry, or fish each day. A 3-oz serving of meat is about the same size as a deck of cards. One egg equals 1 oz. ? 2 servings of low-fat dairy each day. ? A serving of nuts, seeds, or beans 5 times each week. ? Heart-healthy fats. Healthy fats called Omega-3 fatty acids are found in foods such as flaxseeds and coldwater fish, like sardines, salmon, and mackerel.  Limit how much you eat of the following: ? Canned or prepackaged foods. ? Food that is high in trans fat, such as fried foods. ? Food that is high in saturated fat, such as fatty meat. ? Sweets, desserts, sugary drinks, and other foods with added sugar. ? Full-fat dairy products.  Do not salt foods before eating.  Try to eat at least 2 vegetarian meals each week.  Eat more home-cooked food and less restaurant, buffet, and fast food.  When eating at a restaurant, ask that your food be prepared with less salt or no salt, if possible. What foods are recommended? The items listed may not be a complete list. Talk with your dietitian about what dietary choices are best for you. Grains Whole-grain or whole-wheat bread. Whole-grain or whole-wheat pasta. Brown rice. Orpah Cobb. Bulgur. Whole-grain  and low-sodium cereals. Pita bread. Low-fat, low-sodium crackers. Whole-wheat flour tortillas. Vegetables Fresh or frozen vegetables (raw, steamed, roasted, or grilled). Low-sodium or reduced-sodium tomato and vegetable juice. Low-sodium or reduced-sodium tomato sauce and tomato paste. Low-sodium or reduced-sodium canned  vegetables. Fruits All fresh, dried, or frozen fruit. Canned fruit in natural juice (without added sugar). Meat and other protein foods Skinless chicken or [place]. Ground chicken or [place]. Pork with fat trimmed off. Fish and seafood. Egg whites. Dried beans, peas, or lentils. Unsalted nuts, nut butters, and seeds. Unsalted canned beans. Lean cuts of beef with fat trimmed off. Low-sodium, lean deli meat. Dairy Low-fat (1%) or fat-free (skim) milk. Fat-free, low-fat, or reduced-fat cheeses. Nonfat, low-sodium ricotta or cottage cheese. Low-fat or nonfat yogurt. Low-fat, low-sodium cheese. Fats and oils Soft margarine without trans fats. Vegetable oil. Low-fat, reduced-fat, or light mayonnaise and salad dressings (reduced-sodium). Canola, safflower, olive, soybean, and sunflower oils. Avocado. Seasoning and other foods Herbs. Spices. Seasoning mixes without salt. Unsalted popcorn and pretzels. Fat-free sweets. What foods are not recommended? The items listed may not be a complete list. Talk with your dietitian about what dietary choices are best for you. Grains Baked goods made with fat, such as croissants, muffins, or some breads. Dry pasta or rice meal packs. Vegetables Creamed or fried vegetables. Vegetables in a cheese sauce. Regular canned vegetables (not low-sodium or reduced-sodium). Regular canned tomato sauce and paste (not low-sodium or reduced-sodium). Regular tomato and vegetable juice (not low-sodium or reduced-sodium). Rosita Fire. Olives. Fruits Canned fruit in a light or heavy syrup. Fried fruit. Fruit in cream or butter sauce. Meat and other protein foods Fatty cuts  of meat. Ribs. Fried meat. Tomasa Blase. Sausage. Bologna and other processed lunch meats. Salami. Fatback. Hotdogs. Bratwurst. Salted nuts and seeds. Canned beans with added salt. Canned or smoked fish. Whole eggs or egg yolks. Chicken or [place] with skin. Dairy Whole or 2% milk, cream, and half-and-half. Whole or full-fat cream cheese. Whole-fat or sweetened yogurt. Full-fat cheese. Nondairy creamers. Whipped toppings. Processed cheese and cheese spreads. Fats and oils Butter. Stick margarine. Lard. Shortening. Ghee. Bacon fat. Tropical oils, such as coconut, palm kernel, or palm oil. Seasoning and other foods Salted popcorn and pretzels. Onion salt, garlic salt, seasoned salt, table salt, and sea salt. Worcestershire sauce. Tartar sauce. Barbecue sauce. Teriyaki sauce. Soy sauce, including reduced-sodium. Steak sauce. Canned and packaged gravies. Fish sauce. Oyster sauce. Cocktail sauce. Horseradish that you find on the shelf. Ketchup. Mustard. Meat flavorings and tenderizers. Bouillon cubes. Hot sauce and Tabasco sauce. Premade or packaged marinades. Premade or packaged taco seasonings. Relishes. Regular salad dressings. Where to find more information:  National Heart, Lung, and Blood Institute: PopSteam.is  American Heart Association: www.heart.org Summary  The DASH eating plan is a healthy eating plan that has been shown to reduce high blood pressure (hypertension). It may also reduce your risk for type 2 diabetes, heart disease, and stroke.  With the DASH eating plan, you should limit salt (sodium) intake to 2,300 mg a day. If you have hypertension, you may need to reduce your sodium intake to 1,500 mg a day.  When on the DASH eating plan, aim to eat more fresh fruits and vegetables, whole grains, lean proteins, low-fat dairy, and heart-healthy fats.  Work with your health care provider or diet and nutrition specialist (dietitian) to adjust your eating plan to your individual calorie  needs. This information is not intended to replace advice given to you by your health care provider. Make sure you discuss any questions you have with your health care provider. Document Released: 10/19/2011 Document Revised: 10/23/2016 Document Reviewed: 10/23/2016 Elsevier Interactive Patient Education  2019 [employer].

## 2018-11-20 NOTE — Progress Notes (Signed)
Patient: Nathaniel Webb Male    DOB: Nov 07, 1966   53 y.o.   MRN: [id number] Visit Date: 11/20/2018  Today's Provider: Langston Reusing, NP   Chief Complaint  Patient presents with  . Follow-up    diabetes   Subjective:    HPI Nathaniel Webb 53 y/o male present for f/u dm, hyperlipidemia, hypertension, and he was seen at the ED on 11/11/18  for right leg and calf pain, ultra sound was negative for DVT and prescribed flexeril.    Right leg pain: He reports that pain to right leg has resolved and flexeril didn't relieve pain, but he stated that he still experiences dull intermittent 2/10 pain to calf with extensive working.  Today he c/o swelling to medial aspect of right ankle and left metatarsal foot that has being going on for 2 weeks. Right ankle edema and left metatarsal area resolves with elevation. He reports that he experiences mild discomfort to left metatarsal area while walking. He denies injury to foot and denies calf pain.  He continues to take 20 mg lisinopril daily and does monitor BP at home. He takes 500 mg metformin daily and doesn't monitor blood glucose at home because he unaware how to use the glucometer. He continues to take 10 mg Lipitor for hyperlipidemia. He denies chest pain, palpitation, light headedness, myalgia, shortness of breath, fever, chills. Otherwise he states that he's in good health.  No Known Allergies Previous Medications   ASPIRIN EC 81 MG TABLET    Take 1 tablet (81 mg total) by mouth daily.   ATORVASTATIN (LIPITOR) 10 MG TABLET    Take 1 tablet (10 mg total) by mouth at bedtime.   BLOOD GLUC METER DISP-STRIPS (BLOOD GLUCOSE METER DISPOSABLE) DEVI    For Contour Check FSBS once a day on average, dx E11.9, LON 99 months   BLOOD GLUCOSE MONITORING SUPPL (CONTOUR BLOOD GLUCOSE SYSTEM) DEVI    Check FSBS once a day on average, dx E11.9, LON 99 months   FLUTICASONE (FLONASE) 50 MCG/ACT NASAL SPRAY    Place 2 sprays into both nostrils daily.   LANCETS  (ACCU-CHEK SOFT TOUCH) LANCETS    Check FSBS once a day on average, dx E11.9, LON 99 months   LISINOPRIL (PRINIVIL,ZESTRIL) 20 MG TABLET    Take 1 tablet (20 mg total) by mouth daily.   OMEGA-3 ACID ETHYL ESTERS (LOVAZA) 1 G CAPSULE    Take 2 capsules (2 g total) by mouth 2 (two) times daily. (this replaces your fish oil)    Review of Systems  Constitutional: Negative.   HENT: Negative.   Eyes: Negative.   Respiratory: Negative.   Cardiovascular: Positive for leg swelling (trace medial aspect of right ankle and left metatarsal foot).  Gastrointestinal: Negative.   Endocrine: Negative.   Genitourinary: Negative.   Musculoskeletal: Negative.   Skin: Negative.   Neurological: Negative.   Psychiatric/Behavioral: Negative.     Social History   Tobacco Use  . Smoking status: Former Research scientist (life sciences)  . Smokeless tobacco: Never Used  Substance Use Topics  . Alcohol use: No    Alcohol/week: 0.0 standard drinks   Objective:   BP 135/89 (BP Location: Left Arm, Patient Position: Sitting, Cuff Size: Large)   Pulse 70   Ht '5\' 5"'  (1.651 m)   Wt 242 lb 9.6 oz (110 kg)   SpO2 98%   BMI 40.37 kg/m   Physical Exam Constitutional:      Appearance: Normal appearance.  HENT:  Head: Normocephalic and atraumatic.     Mouth/Throat:     Mouth: Mucous membranes are moist.  Eyes:     Extraocular Movements: Extraocular movements intact.     Pupils: Pupils are equal, round, and reactive to light.  Neck:     Musculoskeletal: Normal range of motion.  Cardiovascular:     Rate and Rhythm: Normal rate and regular rhythm.     Pulses: Normal pulses.     Heart sounds: Normal heart sounds.  Pulmonary:     Effort: Pulmonary effort is normal.     Breath sounds: Normal breath sounds.  Abdominal:     General: Bowel sounds are normal.  Musculoskeletal:     Right lower leg: Edema (trace medial aspect of right ankle, metatarsal of left foot) present.  Skin:    Findings: Lesion (fungus inbetween toes)  present.  Neurological:     General: No focal deficit present.     Mental Status: He is alert and oriented to person, place, and time.  Psychiatric:        Mood and Affect: Mood normal.        Behavior: Behavior normal.        Thought Content: Thought content normal.        Judgment: Judgment normal.         Assessment & Plan:     1. Controlled type 2 diabetes mellitus without complication, without long-term current use of insulin ([place]) - His HgbA1c was rechecked during visit and it was 7 %, his metformin was increased. - metFORMIN (GLUCOPHAGE) 500 MG tablet; Take 1.5 tablets (750 mg total) by mouth 2 (two) times daily with a meal. (best if taking 15-20 minutes before biggest meal of the day)  Dispense: 90 tablet; Refill: 2 - He was advised to continue on low carbohydrate diet, exercise 30 minutes daily and monitor blood glucose at least bid.  2. Essential hypertension - Blood pressure was rechecked during visit and it was 135/89 and he will continue on 20 mg lisinopril daily, monitor blood pressure at home, continue on low salt diet and exercise 30 minutes daily. Labs will be rechecked in 3 months. - CBC w/Diff; Future - Comp Met (CMET); Future - HgB A1c; Future - Lipid Profile; Future - Urine Microalbumin w/creat. ratio; Future  3. Tinea pedis of both feet- He was advised to apply clotrimazole bid. - clotrimazole (LOTRIMIN) 1 % cream; Apply 1 application topically 2 (two) times daily.  Dispense: 30 g; Refill: 0  4. Right ankle swelling - He was advised to elevate legs while sitting down - Use right ankle brace during the day and remove at night.  5. Left foot pain - Charity care form provided - Ambulatory referral to Podiatry - Follow up in 3 months, cbc,cMet,lipid profile, UA and HgbA1c 1 week prior.       Langston Reusing, NP   [facility]

## 2019-02-10 ENCOUNTER — Ambulatory Visit: Payer: Self-pay | Admitting: Podiatry

## 2019-02-12 ENCOUNTER — Other Ambulatory Visit: Payer: Self-pay

## 2019-02-12 ENCOUNTER — Telehealth: Payer: Self-pay | Admitting: Pharmacy Technician

## 2019-02-12 DIAGNOSIS — I10 Essential (primary) hypertension: Secondary | ICD-10-CM

## 2019-02-12 NOTE — Telephone Encounter (Signed)
Patient still needs to provide poi for 2020.  Contacted patient and made aware.  Sherilyn Dacosta Care Manager [facility]

## 2019-02-13 ENCOUNTER — Ambulatory Visit: Payer: Self-pay | Admitting: Podiatry

## 2019-02-14 ENCOUNTER — Ambulatory Visit: Payer: Self-pay

## 2019-02-14 ENCOUNTER — Other Ambulatory Visit: Payer: Self-pay

## 2019-02-14 ENCOUNTER — Encounter: Payer: Self-pay | Admitting: Podiatry

## 2019-02-14 DIAGNOSIS — M779 Enthesopathy, unspecified: Secondary | ICD-10-CM

## 2019-02-14 LAB — CBC WITH DIFFERENTIAL/PLATELET
Basophils Absolute: 0 10*3/uL (ref 0.0–0.2)
Basos: 0 %
EOS (ABSOLUTE): 0.2 10*3/uL (ref 0.0–0.4)
Eos: 2 %
Hematocrit: 41.1 % (ref 37.5–51.0)
Hemoglobin: 13.3 g/dL (ref 13.0–17.7)
Immature Grans (Abs): 0 10*3/uL (ref 0.0–0.1)
Immature Granulocytes: 0 %
Lymphocytes Absolute: 2.1 10*3/uL (ref 0.7–3.1)
Lymphs: 19 %
MCH: 26.3 pg — ABNORMAL LOW (ref 26.6–33.0)
MCHC: 32.4 g/dL (ref 31.5–35.7)
MCV: 81 fL (ref 79–97)
Monocytes Absolute: 0.8 10*3/uL (ref 0.1–0.9)
Monocytes: 7 %
Neutrophils Absolute: 7.9 10*3/uL — ABNORMAL HIGH (ref 1.4–7.0)
Neutrophils: 72 %
Platelets: 280 10*3/uL (ref 150–450)
RBC: 5.06 x10E6/uL (ref 4.14–5.80)
RDW: 14.4 % (ref 11.6–15.4)
WBC: 11 10*3/uL — ABNORMAL HIGH (ref 3.4–10.8)

## 2019-02-14 LAB — COMPREHENSIVE METABOLIC PANEL
ALT: 26 IU/L (ref 0–44)
AST: 22 IU/L (ref 0–40)
Albumin/Globulin Ratio: 1.3 (ref 1.2–2.2)
Albumin: 4.4 g/dL (ref 3.8–4.9)
Alkaline Phosphatase: 87 IU/L (ref 39–117)
BUN/Creatinine Ratio: 12 (ref 9–20)
BUN: 12 mg/dL (ref 6–24)
Bilirubin Total: 0.3 mg/dL (ref 0.0–1.2)
CO2: 23 mmol/L (ref 20–29)
Calcium: 9.6 mg/dL (ref 8.7–10.2)
Chloride: 100 mmol/L (ref 96–106)
Creatinine, Ser: 0.97 mg/dL (ref 0.76–1.27)
GFR calc Af Amer: 104 mL/min/{1.73_m2} (ref >59)
GFR calc non Af Amer: 90 mL/min/{1.73_m2} (ref >59)
Globulin, Total: 3.5 g/dL (ref 1.5–4.5)
Glucose: 113 mg/dL — ABNORMAL HIGH (ref 65–99)
Potassium: 5.2 mmol/L (ref 3.5–5.2)
Sodium: 141 mmol/L (ref 134–144)
Total Protein: 7.9 g/dL (ref 6.0–8.5)

## 2019-02-14 LAB — LIPID PANEL
Chol/HDL Ratio: 4.5 ratio (ref 0.0–5.0)
Cholesterol, Total: 107 mg/dL (ref 100–199)
HDL: 24 mg/dL — ABNORMAL LOW (ref >39)
LDL Calculated: 61 mg/dL (ref 0–99)
Triglycerides: 109 mg/dL (ref 0–149)
VLDL Cholesterol Cal: 22 mg/dL (ref 5–40)

## 2019-02-14 LAB — MICROALBUMIN / CREATININE URINE RATIO
Creatinine, Urine: 110.4 mg/dL
Microalb/Creat Ratio: <3 mg/g creat (ref 0–29)
Microalbumin, Urine: <3 ug/mL

## 2019-02-14 LAB — HEMOGLOBIN A1C
Est. average glucose Bld gHb Est-mCnc: 134 mg/dL
Hgb A1c MFr Bld: 6.3 % — ABNORMAL HIGH (ref 4.8–5.6)

## 2019-02-19 ENCOUNTER — Encounter: Payer: Self-pay | Admitting: Gerontology

## 2019-02-19 ENCOUNTER — Other Ambulatory Visit: Payer: Self-pay

## 2019-02-19 ENCOUNTER — Ambulatory Visit: Payer: Self-pay | Admitting: Gerontology

## 2019-02-19 DIAGNOSIS — I10 Essential (primary) hypertension: Secondary | ICD-10-CM

## 2019-02-19 DIAGNOSIS — M79672 Pain in left foot: Secondary | ICD-10-CM

## 2019-02-19 DIAGNOSIS — E119 Type 2 diabetes mellitus without complications: Secondary | ICD-10-CM

## 2019-02-19 MED ORDER — METFORMIN HCL 500 MG PO TABS: 500.0000 mg | ORAL_TABLET | Freq: Every day | ORAL | 1 refills | Status: DC

## 2019-02-19 NOTE — Progress Notes (Signed)
Established Patient Office Visit  Subjective:  Patient ID: Nathaniel Webb, male    DOB: [birth year]  Age: 53 y.o. MRN: [id number]  CC:  Chief Complaint  Patient presents with  . Follow-up    high blood pressure, diabetes    HPI Nathaniel Webb presents for follow up on T2DM, Hypertension, Tinea pedis and Left foot pain.  Patient consents to telephone visit and 2 patient identifier was used to identify patient. HTN: Patient is currently taking the following medications; 20 mg lisinopril daily Home BP checks: He states that he checks blood pressure weekly and unable to remember readings. He states that he is compliant with medication and low sodium diet.  T2DM: He reports checking blood glucose once in a while. Last HgbA1c was 02/12/19 and it was 6.3%, Currently on metformin 750 mg bid.  Home blood glucose: He states that he checks blood glucose as needed. He denies hypoglycemia and is compliant with ADA diet, and he states that he's not exercising because of left foot swelling and will follow up with Podiatry.  He denies peripheral neuropathy or eye disease.   Left foot pain: He states that the ball of the left foot is swollen and it has being going on for more than 4 months. He was unable to be seen at the podiatry because they don't accept [facility] charity care.  Tinea Pedis: He states that it has resolved. His WBC was 11 and Neutrophils 7.9, he denies fever and chills, will recheck in 6 weeks. He denies chest pain, palpitation, light headedness, shortness of breath and no further concerns.  Past Medical History:  Diagnosis Date  . Diabetes mellitus without complication (HCC)   . High triglycerides 05/09/2016  . Hypertension   . Low HDL (under 40) 05/09/2016  . Obesity, Class II, BMI 35-39.9, with comorbidity (HCC) 01/04/2016    History reviewed. No pertinent surgical history.  Family History  Problem Relation Age of Onset  . Diabetes Father   . COPD Mother      Social History   Socioeconomic History  . Marital status: Single    Spouse name: Not on file  . Number of children: Not on file  . Years of education: Not on file  . Highest education level: Not on file  Occupational History  . Not on file  Social Needs  . Financial resource strain: Not on file  . Food insecurity:    Worry: Not on file    Inability: Not on file  . Transportation needs:    Medical: Not on file    Non-medical: Not on file  Tobacco Use  . Smoking status: Former Games developer  . Smokeless tobacco: Never Used  Substance and Sexual Activity  . Alcohol use: No    Alcohol/week: 0.0 standard drinks  . Drug use: No  . Sexual activity: Yes    Partners: Female    Birth control/protection: None  Lifestyle  . Physical activity:    Days per week: Not on file    Minutes per session: Not on file  . Stress: Not on file  Relationships  . Social connections:    Talks on phone: Not on file    Gets together: Not on file    Attends religious service: Not on file    Active member of club or organization: Not on file    Attends meetings of clubs or organizations: Not on file    Relationship status: Not on file  . Intimate partner violence:  Fear of current or ex partner: Not on file    Emotionally abused: Not on file    Physically abused: Not on file    Forced sexual activity: Not on file  Other Topics Concern  . Not on file  Social History Narrative  . Not on file    Outpatient Medications Prior to Visit  Medication Sig Dispense Refill  . Acetaminophen 500 MG coapsule Take by mouth.    Marland Kitchen. aspirin EC 81 MG tablet Take 1 tablet (81 mg total) by mouth daily. 90 tablet 2  . atorvastatin (LIPITOR) 10 MG tablet Take 1 tablet (10 mg total) by mouth at bedtime. 90 tablet 2  . Blood Gluc Meter Disp-Strips (BLOOD GLUCOSE METER DISPOSABLE) DEVI For Contour Check FSBS once a day on average, dx E11.9, LON 99 months 100 each 0  . Blood Glucose Monitoring Suppl (CONTOUR BLOOD  GLUCOSE SYSTEM) DEVI Check FSBS once a day on average, dx E11.9, LON 99 months 1 Device 0  . clotrimazole (LOTRIMIN) 1 % cream Apply 1 application topically 2 (two) times daily. 30 g 0  . fluticasone (FLONASE) 50 MCG/ACT nasal spray Place 2 sprays into both nostrils daily. 16 g 0  . Lancets (ACCU-CHEK SOFT TOUCH) lancets Check FSBS once a day on average, dx E11.9, LON 99 months 100 each 1  . lisinopril (PRINIVIL,ZESTRIL) 20 MG tablet Take 1 tablet (20 mg total) by mouth daily. 90 tablet 2  . omega-3 acid ethyl esters (LOVAZA) 1 g capsule Take 2 capsules (2 g total) by mouth 2 (two) times daily. (this replaces your fish oil) 180 capsule 2  . metFORMIN (GLUCOPHAGE) 500 MG tablet Take 1.5 tablets (750 mg total) by mouth 2 (two) times daily with a meal. (best if taking 15-20 minutes before biggest meal of the day) 90 tablet 2   No facility-administered medications prior to visit.     No Known Allergies  ROS Review of Systems  Constitutional: Negative.   HENT: Negative.   Respiratory: Negative.   Cardiovascular: Positive for leg swelling (left sole swelling).  Gastrointestinal: Negative.   Endocrine: Negative.   Genitourinary: Negative.   Musculoskeletal: Negative.   Skin: Negative.   Neurological: Negative.   Psychiatric/Behavioral: Negative.       Objective:    Physical Exam  No vital sign or PE was done due to telephone visit.  There were no vitals taken for this visit. Wt Readings from Last 3 Encounters:  11/20/18 242 lb 9.6 oz (110 kg)  11/11/18 234 lb (106.1 kg)  08/15/18 242 lb 8 oz (110 kg)     Health Maintenance Due  Topic Date Due  . PNEUMOCOCCAL POLYSACCHARIDE VACCINE AGE 47-64 HIGH RISK  10/17/1969  . HIV Screening  10/17/1982  . COLONOSCOPY  10/17/2017    There are no preventive care reminders to display for this patient.  Lab Results  Component Value Date   TSH 1.320 01/24/2018   Lab Results  Component Value Date   WBC 11.0 (H) 02/12/2019   HGB 13.3  02/12/2019   HCT 41.1 02/12/2019   MCV 81 02/12/2019   PLT 280 02/12/2019   Lab Results  Component Value Date   NA 141 02/12/2019   K 5.2 02/12/2019   CO2 23 02/12/2019   GLUCOSE 113 (H) 02/12/2019   BUN 12 02/12/2019   CREATININE 0.97 02/12/2019   BILITOT 0.3 02/12/2019   ALKPHOS 87 02/12/2019   AST 22 02/12/2019   ALT 26 02/12/2019   PROT 7.9 02/12/2019  ALBUMIN 4.4 02/12/2019   CALCIUM 9.6 02/12/2019   Lab Results  Component Value Date   CHOL 107 02/12/2019   Lab Results  Component Value Date   HDL 24 (L) 02/12/2019   Lab Results  Component Value Date   LDLCALC 61 02/12/2019   Lab Results  Component Value Date   TRIG 109 02/12/2019   Lab Results  Component Value Date   CHOLHDL 4.5 02/12/2019   Lab Results  Component Value Date   HGBA1C 6.3 (H) 02/12/2019      Assessment & Plan:     1. Controlled type 2 diabetes mellitus without complication, without long-term current use of insulin (HCC) - HgbA1c was 6.3%, Prediabetes, his metformin was decreased to 500 mg daily - metFORMIN (GLUCOPHAGE) 500 MG tablet; Take 1 tablet (500 mg total) by mouth daily. (best if taking 15-20 minutes before biggest meal of the day)  Dispense: 90 tablet; Refill: 1 . Use Diabetic diet as advised  . Check blood sugar once daily . Write down the numbers against date in a log . Bring log to clinic every visit . Take medications regularly as advised . Regular exercise . HgbA1c recheck in 3 months.    2. Essential hypertension . -Low salt DASH diet . Take medications regularly on time . Exercise regularly as tolerated . Check blood pressure at least once a week at home and record, bring log to next appointment. . Goal is less than 140/90 and normal blood pressure is 120/80    3. Left foot pain and swelling of ball of left foot -  Will verify Podiatry clinic that accepts [facility] charity care application. - Patient will be notified. - Advised patient to elevate leg  while sitting down.  Follow-up: Return in about 1 month (around 03/21/2019), or if symptoms worsen or fail to improve.    Jerusalen Mateja Trellis Paganini, NP

## 2019-02-24 NOTE — Progress Notes (Signed)
This encounter was created in error - please disregard.

## 2019-02-25 ENCOUNTER — Encounter: Payer: Self-pay | Admitting: Podiatry

## 2019-02-25 ENCOUNTER — Other Ambulatory Visit: Payer: Self-pay | Admitting: Podiatry

## 2019-02-25 ENCOUNTER — Ambulatory Visit: Payer: No Typology Code available for payment source

## 2019-02-25 ENCOUNTER — Other Ambulatory Visit: Payer: Self-pay

## 2019-02-25 ENCOUNTER — Ambulatory Visit: Payer: No Typology Code available for payment source | Admitting: Podiatry

## 2019-02-25 VITALS — Temp 98.6°F

## 2019-02-25 DIAGNOSIS — M7741 Metatarsalgia, right foot: Secondary | ICD-10-CM

## 2019-02-25 DIAGNOSIS — M79671 Pain in right foot: Secondary | ICD-10-CM

## 2019-02-25 DIAGNOSIS — M7742 Metatarsalgia, left foot: Secondary | ICD-10-CM

## 2019-02-25 DIAGNOSIS — M7752 Other enthesopathy of left foot: Secondary | ICD-10-CM

## 2019-02-25 DIAGNOSIS — M779 Enthesopathy, unspecified: Secondary | ICD-10-CM

## 2019-02-25 MED ORDER — METHYLPREDNISOLONE 4 MG PO TBPK: ORAL_TABLET | ORAL | 0 refills | Status: DC

## 2019-02-25 MED ORDER — MELOXICAM 15 MG PO TABS: 15.0000 mg | ORAL_TABLET | Freq: Every day | ORAL | 1 refills | Status: DC

## 2019-02-25 NOTE — Progress Notes (Signed)
   HPI: 53 year old male T2DM presents the office today for bilateral forefoot pain is been ongoing for about 4 months now.  He experiences numbness with soreness and increased swelling after long periods of walking.  Patient states that he is on his feet for several hours during the day and he enjoys walking.  He states that his toes get swollen and stiff when walking.  Patient has not done anything for treatment currently.  Past Medical History:  Diagnosis Date  . Diabetes mellitus without complication (HCC)   . High triglycerides 05/09/2016  . Hypertension   . Low HDL (under 40) 05/09/2016  . Obesity, Class II, BMI 35-39.9, with comorbidity (HCC) 01/04/2016     Physical Exam: General: The patient is alert and oriented x3 in no acute distress.  Dermatology: Skin is warm, dry and supple bilateral lower extremities. Negative for open lesions or macerations.  Vascular: Palpable pedal pulses bilaterally. No edema or erythema noted. Capillary refill within normal limits.  Neurological: Epicritic and protective threshold grossly intact bilaterally.   Musculoskeletal Exam: Range of motion within normal limits to all pedal and ankle joints bilateral. Muscle strength 5/5 in all groups bilateral.  There is some pain on palpation along the metatarsal heads of the bilateral feet consistent with metatarsalgia.  There is also pain on palpation range of motion of the second MPJ left foot.  Radiographic Exam:  Normal osseous mineralization. Joint spaces preserved. No fracture/dislocation/boney destruction.    Assessment: 1.  Second metatarsophalangeal capsulitis left 2.  Metatarsalgia bilateral forefoot   Plan of Care:  1. Patient evaluated. X-Rays reviewed.  2.  Injection of 0.5 cc Celestone Soluspan injected in the second MPJ left foot 3.  Prescription for Medrol Dosepak 4.  Prescription for meloxicam 15 mg 5.  Continue wearing new balance sneakers 6.  Return to clinic as needed      Felecia Shelling, DPM [facility]  Dr. Felecia Shelling, DPM    [address] [place], [place] [address]                Office [phone]  Fax [phone]

## 2019-02-27 ENCOUNTER — Telehealth: Payer: Self-pay | Admitting: Pharmacy Technician

## 2019-02-27 NOTE — Telephone Encounter (Signed)
Received 2020 proof of income.  Patient eligible to receive medication assistance at [facility] as long as eligibility requirements continue to be met.  [facility]

## 2019-03-19 ENCOUNTER — Ambulatory Visit: Payer: Self-pay | Admitting: Gerontology

## 2019-03-26 ENCOUNTER — Ambulatory Visit: Payer: Self-pay | Admitting: Gerontology

## 2019-03-26 ENCOUNTER — Other Ambulatory Visit: Payer: Self-pay

## 2019-03-26 ENCOUNTER — Encounter: Payer: Self-pay | Admitting: Gerontology

## 2019-03-26 DIAGNOSIS — I10 Essential (primary) hypertension: Secondary | ICD-10-CM

## 2019-03-26 DIAGNOSIS — E119 Type 2 diabetes mellitus without complications: Secondary | ICD-10-CM

## 2019-03-26 DIAGNOSIS — M79672 Pain in left foot: Secondary | ICD-10-CM

## 2019-03-26 NOTE — Progress Notes (Signed)
Established Patient Office Visit  Subjective:  Patient ID: Nathaniel Webb, male    DOB: 09/21/1966  Age: 53 y.o. MRN: [id number]  CC: No chief complaint on file. Patient consents to telephone visit and 2 patient identifier was used to identify patient.    HPI Nathaniel Webb presents for follow up for HTN, T2DM, Left foot pain.   He reports taking 20 mg lisinopril daily and checks blood pressure weekly at home and he states that his blood pressure reading 4 days ago was 134/85. He denies dizziness, headache and cough. He reports being compliant with low sodium diet and exercise. He states that he continues to take 500 mg Metformin daily and checks his blood sugar once a week. He states that his fasting reading 3 days ago was 100 mg/dl. He denies hypoglycemia and peripheral neuropathy. He's compliant with foot checks  and low carb diet. He continues to take 10 mg Atorvastatin and denies myalgia.  He had a  Visit with the Podiatrist  Dr Orlinda BlalockE Brent on 02/25/19, his assessment showed second metatarsophalangeal capsulitis left and metatarsalgia bilateral forefoot. Celestone Soluspan 0.5 cc  was injected in the second MPJ of left foot. He reports that he has completed the course of Medrol dosepak and he stopped taking Meloxicam 15 mg because it didn't relieve his pain. Currently , he reports that he continues to experience discomfort to his left metatarsal when he walks and removing his shoe offers some relief. He also reports having intermittent swelling to the left metatarsal area, and he doesn't have money to purchase new balance sneakers. He denies fever, chills, chest pain and no further concerns.  Past Medical History:  Diagnosis Date  . Diabetes mellitus without complication (HCC)   . High triglycerides 05/09/2016  . Hypertension   . Low HDL (under 40) 05/09/2016  . Obesity, Class II, BMI 35-39.9, with comorbidity (HCC) 01/04/2016    History reviewed. No pertinent surgical history.  Family  History  Problem Relation Age of Onset  . Diabetes Father   . COPD Mother     Social History   Socioeconomic History  . Marital status: Single    Spouse name: Not on file  . Number of children: Not on file  . Years of education: Not on file  . Highest education level: Not on file  Occupational History  . Not on file  Social Needs  . Financial resource strain: Not on file  . Food insecurity:    Worry: Not on file    Inability: Not on file  . Transportation needs:    Medical: Not on file    Non-medical: Not on file  Tobacco Use  . Smoking status: Former Games developermoker  . Smokeless tobacco: Never Used  Substance and Sexual Activity  . Alcohol use: No    Alcohol/week: 0.0 standard drinks  . Drug use: No  . Sexual activity: Yes    Partners: Female    Birth control/protection: None  Lifestyle  . Physical activity:    Days per week: Not on file    Minutes per session: Not on file  . Stress: Not on file  Relationships  . Social connections:    Talks on phone: Not on file    Gets together: Not on file    Attends religious service: Not on file    Active member of club or organization: Not on file    Attends meetings of clubs or organizations: Not on file    Relationship status:  Not on file  . Intimate partner violence:    Fear of current or ex partner: Not on file    Emotionally abused: Not on file    Physically abused: Not on file    Forced sexual activity: Not on file  Other Topics Concern  . Not on file  Social History Narrative  . Not on file    Outpatient Medications Prior to Visit  Medication Sig Dispense Refill  . Acetaminophen 500 MG coapsule Take by mouth.    Marland Kitchen aspirin EC 81 MG tablet Take 1 tablet (81 mg total) by mouth daily. 90 tablet 2  . atorvastatin (LIPITOR) 10 MG tablet Take 1 tablet (10 mg total) by mouth at bedtime. 90 tablet 2  . Blood Gluc Meter Disp-Strips (BLOOD GLUCOSE METER DISPOSABLE) DEVI For Contour Check FSBS once a day on average, dx E11.9,  LON 99 months 100 each 0  . Blood Glucose Monitoring Suppl (CONTOUR BLOOD GLUCOSE SYSTEM) DEVI Check FSBS once a day on average, dx E11.9, LON 99 months 1 Device 0  . fluticasone (FLONASE) 50 MCG/ACT nasal spray Place 2 sprays into both nostrils daily. 16 g 0  . Lancets (ACCU-CHEK SOFT TOUCH) lancets Check FSBS once a day on average, dx E11.9, LON 99 months 100 each 1  . lisinopril (PRINIVIL,ZESTRIL) 20 MG tablet Take 1 tablet (20 mg total) by mouth daily. 90 tablet 2  . meloxicam (MOBIC) 15 MG tablet Take 1 tablet (15 mg total) by mouth daily. 30 tablet 1  . metFORMIN (GLUCOPHAGE) 500 MG tablet Take 1 tablet (500 mg total) by mouth daily. (best if taking 15-20 minutes before biggest meal of the day) 90 tablet 1  . methylPREDNISolone (MEDROL DOSEPAK) 4 MG TBPK tablet 6 day dose pack - take as directed 21 tablet 0  . omega-3 acid ethyl esters (LOVAZA) 1 g capsule Take 2 capsules (2 g total) by mouth 2 (two) times daily. (this replaces your fish oil) 180 capsule 2   No facility-administered medications prior to visit.     No Known Allergies  ROS Review of Systems  Constitutional: Negative.   Eyes: Negative.   Respiratory: Negative.   Cardiovascular: Negative.   Musculoskeletal: Positive for joint swelling (intermittent swelling to left metatarsal area.).  Skin: Negative.   Neurological: Negative.   Psychiatric/Behavioral: Negative.       Objective:    Physical Exam No vital sign or PE was done.  There were no vitals taken for this visit. Wt Readings from Last 3 Encounters:  11/20/18 242 lb 9.6 oz (110 kg)  11/11/18 234 lb (106.1 kg)  08/15/18 242 lb 8 oz (110 kg)     Health Maintenance Due  Topic Date Due  . PNEUMOCOCCAL POLYSACCHARIDE VACCINE AGE 62-64 HIGH RISK  10/17/1969  . HIV Screening  10/17/1982  . COLONOSCOPY  10/17/2017    There are no preventive care reminders to display for this patient.  Lab Results  Component Value Date   TSH 1.320 01/24/2018   Lab  Results  Component Value Date   WBC 11.0 (H) 02/12/2019   HGB 13.3 02/12/2019   HCT 41.1 02/12/2019   MCV 81 02/12/2019   PLT 280 02/12/2019   Lab Results  Component Value Date   NA 141 02/12/2019   K 5.2 02/12/2019   CO2 23 02/12/2019   GLUCOSE 113 (H) 02/12/2019   BUN 12 02/12/2019   CREATININE 0.97 02/12/2019   BILITOT 0.3 02/12/2019   ALKPHOS 87 02/12/2019   AST  22 02/12/2019   ALT 26 02/12/2019   PROT 7.9 02/12/2019   ALBUMIN 4.4 02/12/2019   CALCIUM 9.6 02/12/2019   Lab Results  Component Value Date   CHOL 107 02/12/2019   Lab Results  Component Value Date   HDL 24 (L) 02/12/2019   Lab Results  Component Value Date   LDLCALC 61 02/12/2019   Lab Results  Component Value Date   TRIG 109 02/12/2019   Lab Results  Component Value Date   CHOLHDL 4.5 02/12/2019   Lab Results  Component Value Date   HGBA1C 6.3 (H) 02/12/2019      Assessment & Plan:     1. Essential hypertension - He will continue on current treatment regimen. -Low salt DASH diet -Exercise regularly as tolerated -Check blood pressure at least once a week at home and record -Goal is less than 140/90   2. Left foot pain - He was advised to schedule a follow up appointment with [facility]. - He was encouraged to purchase new balance sneakers.  3. Type 2 diabetes mellitus without complication, without long-term current use of insulin (HCC) - His last HgbA1c on 02/12/2019 was 6.3 5%, and he will continue on current treatment regimen. -Use Diabetic diet as advised  -Check blood sugar 2 times a day, once before breakfast and others 2 hours after lunch  -Write down the numbers against date in a log, bring log to clinic every visit -Take medications regularly as advised -Regular exercise      Follow-up: Return in about 6 weeks (around 05/07/2019), or if symptoms worsen or fail to improve.    Brandyn Lowrey Trellis Paganini, NP

## 2019-03-27 ENCOUNTER — Other Ambulatory Visit: Payer: Self-pay | Admitting: Internal Medicine

## 2019-03-27 DIAGNOSIS — E119 Type 2 diabetes mellitus without complications: Secondary | ICD-10-CM

## 2019-04-04 ENCOUNTER — Other Ambulatory Visit: Payer: Self-pay

## 2019-04-04 ENCOUNTER — Encounter: Payer: Self-pay | Admitting: Podiatry

## 2019-04-04 ENCOUNTER — Ambulatory Visit (INDEPENDENT_AMBULATORY_CARE_PROVIDER_SITE_OTHER): Payer: No Typology Code available for payment source | Admitting: Podiatry

## 2019-04-04 VITALS — Temp 98.3°F

## 2019-04-04 DIAGNOSIS — M7752 Other enthesopathy of left foot: Secondary | ICD-10-CM

## 2019-04-04 DIAGNOSIS — M7742 Metatarsalgia, left foot: Secondary | ICD-10-CM

## 2019-04-04 DIAGNOSIS — M7741 Metatarsalgia, right foot: Secondary | ICD-10-CM

## 2019-04-04 MED ORDER — DICLOFENAC SODIUM 75 MG PO TBEC: 75.0000 mg | DELAYED_RELEASE_TABLET | Freq: Two times a day (BID) | ORAL | 1 refills | Status: DC

## 2019-04-08 NOTE — Progress Notes (Signed)
   HPI: 53- year-old male presenting today for follow up evaluation of left foot pain. He states the pain was improving and is now beginning to hurt again starting one week ago. He reports associated swelling. Walking increases the pain. He has been taking Meloxicam with no significant relief. Patient is here for further evaluation and treatment.   Past Medical History:  Diagnosis Date  . Diabetes mellitus without complication (HCC)   . High triglycerides 05/09/2016  . Hypertension   . Low HDL (under 40) 05/09/2016  . Obesity, Class II, BMI 35-39.9, with comorbidity (HCC) 01/04/2016     Physical Exam: General: The patient is alert and oriented x3 in no acute distress.  Dermatology: Skin is warm, dry and supple bilateral lower extremities. Negative for open lesions or macerations.  Vascular: Palpable pedal pulses bilaterally. No edema or erythema noted. Capillary refill within normal limits.  Neurological: Epicritic and protective threshold grossly intact bilaterally.   Musculoskeletal Exam: Pain with palpation noted to the 2nd MPJ of the left foot. Range of motion within normal limits to all pedal and ankle joints bilateral. Muscle strength 5/5 in all groups bilateral.   Assessment: 1. 2nd MPJ capsulitis left    Plan of Care:  1. Patient evaluated.  2. Injection of 0.5 mLs Celestone Soluspan injected into the 2nd MPJ of the left foot.  3. Prescription for Diclofenac provided to patient. Discontinue taking Meloxicam.  4. Continue wearing New Balance shoes.  5. Return to clinic as needed.       Felecia Shelling, DPM [facility]  Dr. Felecia Shelling, DPM    [address] [place], [place] [address]                Office [phone]  Fax [phone]

## 2019-05-08 ENCOUNTER — Other Ambulatory Visit: Payer: Self-pay

## 2019-05-08 ENCOUNTER — Ambulatory Visit: Payer: Medicaid Other | Admitting: Gerontology

## 2019-05-08 DIAGNOSIS — Z0000 Encounter for general adult medical examination without abnormal findings: Secondary | ICD-10-CM

## 2019-05-08 DIAGNOSIS — M170 Bilateral primary osteoarthritis of knee: Secondary | ICD-10-CM | POA: Insufficient documentation

## 2019-05-08 DIAGNOSIS — E1142 Type 2 diabetes mellitus with diabetic polyneuropathy: Secondary | ICD-10-CM

## 2019-05-08 DIAGNOSIS — M7741 Metatarsalgia, right foot: Secondary | ICD-10-CM

## 2019-05-08 DIAGNOSIS — G629 Polyneuropathy, unspecified: Secondary | ICD-10-CM | POA: Insufficient documentation

## 2019-05-08 DIAGNOSIS — R7303 Prediabetes: Secondary | ICD-10-CM

## 2019-05-08 MED ORDER — GABAPENTIN 100 MG PO CAPS: 100.0000 mg | ORAL_CAPSULE | Freq: Every day | ORAL | 0 refills | Status: DC

## 2019-05-08 NOTE — Progress Notes (Signed)
Established Patient Office Visit  Subjective:  Patient ID: Nathaniel Webb, male    DOB: [birth year]  Age: 53 y.o. MRN: [id number]  CC:  Chief Complaint  Patient presents with  . Follow-up    legs are tight and numb; toes are puffy   Patient consents to telephone visit and 2 patient identifiers was used to identify patient. HPI KEIMON BASALDUA presents for complaint of worsening metatarsalgia of feet , stiffness to bilateral knee,intermittent numbness to right thigh and right great toe. He has a history of type 2 diabetes, hypertension and hyperlipidemia. He was evaluated and treated by Dr Amalia Hailey B on 04/04/19 for metatarsalgia of boot foot, 0.5 ml celestone soluspan was administered to 2nd MPJ of left foot and Diclofenac was prescribed. Feet Radiographic imaging done on 02/25/19, showed normal osseous mineralization, joint spaces preserved, no fracture, dislocation or boney destruction. He reports that he continues to experience constant sore non radiating 8/10 pain to metatarsal of feet. He states that he's unable to stand for a long time and taking tylenol 1000 mg as needed and wearing New balance shoe offers minimal relief. He also c/o stiffness and swelling to bilateral knee when he wakes up in the morning, which last all day but walking minimally loosens joints. He also c/o intermittent numbness to right thigh and right great toe, and these symptoms has being going on for more than 1 year. He denies trauma and he states that nothing relieves his discomfort. He denies chest pain, palpitation, fever, chills and no further concerns.  Past Medical History:  Diagnosis Date  . Diabetes mellitus without complication ([place])   . High triglycerides 05/09/2016  . Hypertension   . Low HDL (under 40) 05/09/2016  . Obesity, Class II, BMI 35-39.9, with comorbidity ([place]) 01/04/2016    No past surgical history on file.  Family History  Problem Relation Age of Onset  . Diabetes Father   . COPD Mother      Social History   Socioeconomic History  . Marital status: Single    Spouse name: Not on file  . Number of children: Not on file  . Years of education: Not on file  . Highest education level: Not on file  Occupational History  . Not on file  Social Needs  . Financial resource strain: Not on file  . Food insecurity    Worry: Not on file    Inability: Not on file  . Transportation needs    Medical: Not on file    Non-medical: Not on file  Tobacco Use  . Smoking status: Former Research scientist (life sciences)  . Smokeless tobacco: Never Used  Substance and Sexual Activity  . Alcohol use: No    Alcohol/week: 0.0 standard drinks  . Drug use: No  . Sexual activity: Yes    Partners: Female    Birth control/protection: None  Lifestyle  . Physical activity    Days per week: Not on file    Minutes per session: Not on file  . Stress: Not on file  Relationships  . Social Herbalist on phone: Not on file    Gets together: Not on file    Attends religious service: Not on file    Active member of club or organization: Not on file    Attends meetings of clubs or organizations: Not on file    Relationship status: Not on file  . Intimate partner violence    Fear of current or ex partner: Not on  file    Emotionally abused: Not on file    Physically abused: Not on file    Forced sexual activity: Not on file  Other Topics Concern  . Not on file  Social History Narrative  . Not on file    Outpatient Medications Prior to Visit  Medication Sig Dispense Refill  . Acetaminophen 500 MG coapsule Take by mouth.    Marland Kitchen aspirin EC 81 MG tablet TAKE ONE TABLET BY MOUTH EVERY DAY 90 tablet 0  . atorvastatin (LIPITOR) 10 MG tablet Take 1 tablet (10 mg total) by mouth at bedtime. 90 tablet 2  . Blood Gluc Meter Disp-Strips (BLOOD GLUCOSE METER DISPOSABLE) DEVI For Contour Check FSBS once a day on average, dx E11.9, LON 99 months 100 each 0  . Blood Glucose Monitoring Suppl (CONTOUR BLOOD GLUCOSE SYSTEM)  DEVI Check FSBS once a day on average, dx E11.9, LON 99 months 1 Device 0  . fluticasone (FLONASE) 50 MCG/ACT nasal spray Place 2 sprays into both nostrils daily. 16 g 0  . Lancets (ACCU-CHEK SOFT TOUCH) lancets Check FSBS once a day on average, dx E11.9, LON 99 months 100 each 1  . lisinopril (PRINIVIL,ZESTRIL) 20 MG tablet Take 1 tablet (20 mg total) by mouth daily. 90 tablet 2  . metFORMIN (GLUCOPHAGE) 500 MG tablet Take 1 tablet (500 mg total) by mouth daily. (best if taking 15-20 minutes before biggest meal of the day) 90 tablet 1  . omega-3 acid ethyl esters (LOVAZA) 1 g capsule Take 2 capsules (2 g total) by mouth 2 (two) times daily. (this replaces your fish oil) 180 capsule 2  . diclofenac (VOLTAREN) 75 MG EC tablet Take 1 tablet (75 mg total) by mouth 2 (two) times daily. 60 tablet 1  . meloxicam (MOBIC) 15 MG tablet Take 1 tablet (15 mg total) by mouth daily. 30 tablet 1  . methylPREDNISolone (MEDROL DOSEPAK) 4 MG TBPK tablet 6 day dose pack - take as directed 21 tablet 0   No facility-administered medications prior to visit.     No Known Allergies  ROS Review of Systems  Constitutional: Negative.   Respiratory: Negative.   Cardiovascular: Negative.   Musculoskeletal: Positive for arthralgias (bilateral knee pain).  Neurological: Positive for numbness (to great toes and right thigh).  Psychiatric/Behavioral: Negative.       Objective:    Physical Exam No vital sign and PE done There were no vitals taken for this visit. Wt Readings from Last 3 Encounters:  11/20/18 242 lb 9.6 oz (110 kg)  11/11/18 234 lb (106.1 kg)  08/15/18 242 lb 8 oz (110 kg)     Health Maintenance Due  Topic Date Due  . PNEUMOCOCCAL POLYSACCHARIDE VACCINE AGE 49-64 HIGH RISK  10/17/1969  . HIV Screening  10/17/1982  . COLONOSCOPY  10/17/2017    There are no preventive care reminders to display for this patient.  Lab Results  Component Value Date   TSH 1.320 01/24/2018   Lab Results   Component Value Date   WBC 11.0 (H) 02/12/2019   HGB 13.3 02/12/2019   HCT 41.1 02/12/2019   MCV 81 02/12/2019   PLT 280 02/12/2019   Lab Results  Component Value Date   NA 141 02/12/2019   K 5.2 02/12/2019   CO2 23 02/12/2019   GLUCOSE 113 (H) 02/12/2019   BUN 12 02/12/2019   CREATININE 0.97 02/12/2019   BILITOT 0.3 02/12/2019   ALKPHOS 87 02/12/2019   AST 22 02/12/2019   ALT  26 02/12/2019   PROT 7.9 02/12/2019   ALBUMIN 4.4 02/12/2019   CALCIUM 9.6 02/12/2019   Lab Results  Component Value Date   CHOL 107 02/12/2019   Lab Results  Component Value Date   HDL 24 (L) 02/12/2019   Lab Results  Component Value Date   LDLCALC 61 02/12/2019   Lab Results  Component Value Date   TRIG 109 02/12/2019   Lab Results  Component Value Date   CHOLHDL 4.5 02/12/2019   Lab Results  Component Value Date   HGBA1C 6.3 (H) 02/12/2019      Assessment & Plan:      1. Type 2 diabetes, controlled, with peripheral neuropathy (HCC) - His HgbA1c was 6.3 and he will continue on current treatment regimen. He will start 100 mg gabapentin at bedtime for numbness to right great toe and right thigh, he was educated on medication side effect and will recheck  - HgB A1c; Future  2. Osteoarthritis of both knees, unspecified osteoarthritis type -He was advised to continue on otc tylenol as needed and not to exceed 4000 mg daily. Will order labs to rule out gout, RA and worsening OA. He was advised to go to the ED for worsening pain. - Uric acid; Future - CBC w/Diff; Future - Sedimentation rate; Future - Rheumatoid Factor; Future - CHG X-RAY KNEE 3 VIEW  3. Health maintenance examination  - Comp Met (CMET); Future  4. Metatarsalgia of both feet - He will continue to take otc tylenol and wear New Balance shoe. -He was advised to go to the ED for worsening pain.   Follow-up: Return in about 1 month (around 06/10/2019), or if symptoms worsen or fail to improve.    Tracker Mance Jerold Coombe, NP

## 2019-05-27 ENCOUNTER — Other Ambulatory Visit: Payer: Self-pay

## 2019-05-27 DIAGNOSIS — M170 Bilateral primary osteoarthritis of knee: Secondary | ICD-10-CM

## 2019-06-02 ENCOUNTER — Ambulatory Visit
Admission: RE | Admit: 2019-06-02 | Discharge: 2019-06-02 | Disposition: A | Discharge status: Home / Self Care | Payer: Self-pay | Source: Ambulatory Visit | Attending: Gerontology | Admitting: Gerontology

## 2019-06-02 ENCOUNTER — Other Ambulatory Visit: Payer: Self-pay

## 2019-06-02 DIAGNOSIS — M170 Bilateral primary osteoarthritis of knee: Secondary | ICD-10-CM

## 2019-06-04 ENCOUNTER — Other Ambulatory Visit: Payer: Medicaid Other

## 2019-06-09 ENCOUNTER — Other Ambulatory Visit: Payer: Self-pay | Admitting: Gerontology

## 2019-06-09 DIAGNOSIS — E119 Type 2 diabetes mellitus without complications: Secondary | ICD-10-CM

## 2019-06-11 ENCOUNTER — Other Ambulatory Visit: Payer: Self-pay

## 2019-06-11 ENCOUNTER — Other Ambulatory Visit: Payer: Medicaid Other

## 2019-06-11 ENCOUNTER — Ambulatory Visit: Payer: Medicaid Other | Admitting: Gerontology

## 2019-06-11 DIAGNOSIS — Z0000 Encounter for general adult medical examination without abnormal findings: Secondary | ICD-10-CM

## 2019-06-11 DIAGNOSIS — M170 Bilateral primary osteoarthritis of knee: Secondary | ICD-10-CM

## 2019-06-11 DIAGNOSIS — E1142 Type 2 diabetes mellitus with diabetic polyneuropathy: Secondary | ICD-10-CM

## 2019-06-12 LAB — RHEUMATOID FACTOR: Rheumatoid fact SerPl-aCnc: <10 IU/mL (ref 0.0–13.9)

## 2019-06-12 LAB — CBC WITH DIFFERENTIAL/PLATELET
Basophils Absolute: 0 10*3/uL (ref 0.0–0.2)
Basos: 0 %
EOS (ABSOLUTE): 0.2 10*3/uL (ref 0.0–0.4)
Eos: 2 %
Hematocrit: 39.1 % (ref 37.5–51.0)
Hemoglobin: 13.5 g/dL (ref 13.0–17.7)
Immature Grans (Abs): 0 10*3/uL (ref 0.0–0.1)
Immature Granulocytes: 0 %
Lymphocytes Absolute: 2.1 10*3/uL (ref 0.7–3.1)
Lymphs: 27 %
MCH: 27.1 pg (ref 26.6–33.0)
MCHC: 34.5 g/dL (ref 31.5–35.7)
MCV: 79 fL (ref 79–97)
Monocytes Absolute: 0.7 10*3/uL (ref 0.1–0.9)
Monocytes: 9 %
Neutrophils Absolute: 4.8 10*3/uL (ref 1.4–7.0)
Neutrophils: 62 %
Platelets: 260 10*3/uL (ref 150–450)
RBC: 4.98 x10E6/uL (ref 4.14–5.80)
RDW: 14.4 % (ref 11.6–15.4)
WBC: 7.8 10*3/uL (ref 3.4–10.8)

## 2019-06-12 LAB — COMPREHENSIVE METABOLIC PANEL
ALT: 25 IU/L (ref 0–44)
AST: 22 IU/L (ref 0–40)
Albumin/Globulin Ratio: 1.5 (ref 1.2–2.2)
Albumin: 4.4 g/dL (ref 3.8–4.9)
Alkaline Phosphatase: 82 IU/L (ref 39–117)
BUN/Creatinine Ratio: 11 (ref 9–20)
BUN: 10 mg/dL (ref 6–24)
Bilirubin Total: 0.3 mg/dL (ref 0.0–1.2)
CO2: 25 mmol/L (ref 20–29)
Calcium: 9.4 mg/dL (ref 8.7–10.2)
Chloride: 99 mmol/L (ref 96–106)
Creatinine, Ser: 0.87 mg/dL (ref 0.76–1.27)
GFR calc Af Amer: 116 mL/min/{1.73_m2} (ref >59)
GFR calc non Af Amer: 100 mL/min/{1.73_m2} (ref >59)
Globulin, Total: 2.9 g/dL (ref 1.5–4.5)
Glucose: 112 mg/dL — ABNORMAL HIGH (ref 65–99)
Potassium: 4 mmol/L (ref 3.5–5.2)
Sodium: 137 mmol/L (ref 134–144)
Total Protein: 7.3 g/dL (ref 6.0–8.5)

## 2019-06-12 LAB — HEMOGLOBIN A1C
Est. average glucose Bld gHb Est-mCnc: 137 mg/dL
Hgb A1c MFr Bld: 6.4 % — ABNORMAL HIGH (ref 4.8–5.6)

## 2019-06-12 LAB — URIC ACID: Uric Acid: 7.3 mg/dL (ref 3.7–8.6)

## 2019-06-12 LAB — SEDIMENTATION RATE: Sed Rate: 38 mm/hr — ABNORMAL HIGH (ref 0–30)

## 2019-06-18 ENCOUNTER — Encounter: Payer: Self-pay | Admitting: Gerontology

## 2019-06-18 ENCOUNTER — Ambulatory Visit: Payer: Medicaid Other | Admitting: Gerontology

## 2019-06-18 ENCOUNTER — Other Ambulatory Visit: Payer: Self-pay

## 2019-06-18 VITALS — BP 152/95

## 2019-06-18 DIAGNOSIS — E1142 Type 2 diabetes mellitus with diabetic polyneuropathy: Secondary | ICD-10-CM

## 2019-06-18 DIAGNOSIS — I10 Essential (primary) hypertension: Secondary | ICD-10-CM

## 2019-06-18 DIAGNOSIS — M7742 Metatarsalgia, left foot: Secondary | ICD-10-CM | POA: Insufficient documentation

## 2019-06-18 DIAGNOSIS — M170 Bilateral primary osteoarthritis of knee: Secondary | ICD-10-CM

## 2019-06-18 MED ORDER — GABAPENTIN 100 MG PO CAPS: 100.0000 mg | ORAL_CAPSULE | Freq: Two times a day (BID) | ORAL | 0 refills | Status: DC

## 2019-06-18 MED ORDER — METFORMIN HCL 500 MG PO TABS: 500.0000 mg | ORAL_TABLET | Freq: Two times a day (BID) | ORAL | 1 refills | Status: DC

## 2019-06-18 NOTE — Progress Notes (Signed)
Established Patient Office Visit  Subjective:  Patient ID: Nathaniel Webb, male    DOB: 09/22/1966  Age: 53 y.o. MRN: [id number]  CC:  Chief Complaint  Patient presents with  . Follow-up    high blood pressure   Patient consents to telephone visit and 2 patient identifiers was used to identify patient. HPI Nathaniel Webb presents for follow up of worsening metatarsalgia of left foot, controlled type 2 diabetes, hypertension , and osteoarthritis to bilateral knee. He reports that he continues to experience worsening discomfort to left metatarsal foot. He states that it hurts while walking and standing for more than 1 hour. He was evaluated and 0.5cc of Celestone Soluspan injection was administered by Dr Logan BoresEvans to his second MP J of left foot on 02/25/2019. He reports that taking gabapentin offers minimal relief and he denies motor weakness.  He also states that his knees tighten up when he wakes up and stands from a sitting to standing position. He had right knee x-ray  done on 06/02/19, it shows no acute findings, minimal osteoarthritis and Left knee x-ray shows no acute findings, subtle sclerosis over the posterior aspect of the distal femoral diametaphyseal region, likely benign and possibly bone infarct. Follow -up plain films in 3 months was recommended.  He reports that he checks his blood pressure once or twice weekly, but doesn't remember reading. He states that he adheres to [employer] diet and unable to exercise. His last HgbA1c done one week ago was 6.4% and he continues to take 500 mg Metformin daily. He denies hypoglycemic and hyperglycemic symptoms.  He denies chest pain, palpitation, fever, chills and no further concerns.  Past Medical History:  Diagnosis Date  . Diabetes mellitus without complication (HCC)   . Heel pain 06/09/2015  . High triglycerides 05/09/2016  . Hypertension   . Low HDL (under 40) 05/09/2016  . Obesity, Class II, BMI 35-39.9, with comorbidity (HCC) 01/04/2016     No past surgical history on file.  Family History  Problem Relation Age of Onset  . Diabetes Father   . COPD Mother     Social History   Socioeconomic History  . Marital status: Single    Spouse name: Not on file  . Number of children: Not on file  . Years of education: Not on file  . Highest education level: Not on file  Occupational History  . Not on file  Social Needs  . Financial resource strain: Not on file  . Food insecurity    Worry: Not on file    Inability: Not on file  . Transportation needs    Medical: Not on file    Non-medical: Not on file  Tobacco Use  . Smoking status: Former Games developermoker  . Smokeless tobacco: Never Used  Substance and Sexual Activity  . Alcohol use: No    Alcohol/week: 0.0 standard drinks  . Drug use: No  . Sexual activity: Yes    Partners: Female    Birth control/protection: None  Lifestyle  . Physical activity    Days per week: Not on file    Minutes per session: Not on file  . Stress: Not on file  Relationships  . Social Musicianconnections    Talks on phone: Not on file    Gets together: Not on file    Attends religious service: Not on file    Active member of club or organization: Not on file    Attends meetings of clubs or organizations: Not on  file    Relationship status: Not on file  . Intimate partner violence    Fear of current or ex partner: Not on file    Emotionally abused: Not on file    Physically abused: Not on file    Forced sexual activity: Not on file  Other Topics Concern  . Not on file  Social History Narrative  . Not on file    Outpatient Medications Prior to Visit  Medication Sig Dispense Refill  . atorvastatin (LIPITOR) 10 MG tablet Take 1 tablet (10 mg total) by mouth at bedtime. 90 tablet 2  . BAYER ASPIRIN EC LOW DOSE 81 MG EC tablet TAKE ONE TABLET BY MOUTH EVERY DAY 96 tablet 0  . Blood Gluc Meter Disp-Strips (BLOOD GLUCOSE METER DISPOSABLE) DEVI For Contour Check FSBS once a day on average, dx  E11.9, LON 99 months 100 each 0  . Blood Glucose Monitoring Suppl (CONTOUR BLOOD GLUCOSE SYSTEM) DEVI Check FSBS once a day on average, dx E11.9, LON 99 months 1 Device 0  . fluticasone (FLONASE) 50 MCG/ACT nasal spray Place 2 sprays into both nostrils daily. 16 g 0  . Lancets (ACCU-CHEK SOFT TOUCH) lancets Check FSBS once a day on average, dx E11.9, LON 99 months 100 each 1  . lisinopril (PRINIVIL,ZESTRIL) 20 MG tablet Take 1 tablet (20 mg total) by mouth daily. 90 tablet 2  . omega-3 acid ethyl esters (LOVAZA) 1 g capsule Take 2 capsules (2 g total) by mouth 2 (two) times daily. (this replaces your fish oil) 180 capsule 2  . gabapentin (NEURONTIN) 100 MG capsule Take 1 capsule (100 mg total) by mouth daily. 30 capsule 0  . metFORMIN (GLUCOPHAGE) 500 MG tablet Take 1 tablet (500 mg total) by mouth daily. (best if taking 15-20 minutes before biggest meal of the day) 90 tablet 1  . Acetaminophen 500 MG coapsule Take by mouth.     No facility-administered medications prior to visit.     No Known Allergies  ROS Review of Systems  Constitutional: Negative.   Respiratory: Negative.   Cardiovascular: Negative.   Endocrine: Negative.   Musculoskeletal: Positive for arthralgias and gait problem.  Skin: Negative.   Psychiatric/Behavioral: Negative.       Objective:    Physical Exam No vital sign or PE done BP (!) 152/95 Comment: patient stated Wt Readings from Last 3 Encounters:  11/20/18 242 lb 9.6 oz (110 kg)  11/11/18 234 lb (106.1 kg)  08/15/18 242 lb 8 oz (110 kg)   He was encouraged to continue on weight loss regimen.  Health Maintenance Due  Topic Date Due  . PNEUMOCOCCAL POLYSACCHARIDE VACCINE AGE 64-64 HIGH RISK  10/17/1969  . HIV Screening  10/17/1982  . COLONOSCOPY  10/17/2017  . INFLUENZA VACCINE  06/14/2019    There are no preventive care reminders to display for this patient.  Lab Results  Component Value Date   TSH 1.320 01/24/2018   Lab Results  Component  Value Date   WBC 7.8 06/11/2019   HGB 13.5 06/11/2019   HCT 39.1 06/11/2019   MCV 79 06/11/2019   PLT 260 06/11/2019   Lab Results  Component Value Date   NA 137 06/11/2019   K 4.0 06/11/2019   CO2 25 06/11/2019   GLUCOSE 112 (H) 06/11/2019   BUN 10 06/11/2019   CREATININE 0.87 06/11/2019   BILITOT 0.3 06/11/2019   ALKPHOS 82 06/11/2019   AST 22 06/11/2019   ALT 25 06/11/2019   PROT 7.3  06/11/2019   ALBUMIN 4.4 06/11/2019   CALCIUM 9.4 06/11/2019   Lab Results  Component Value Date   CHOL 107 02/12/2019   Lab Results  Component Value Date   HDL 24 (L) 02/12/2019   Lab Results  Component Value Date   LDLCALC 61 02/12/2019   Lab Results  Component Value Date   TRIG 109 02/12/2019   Lab Results  Component Value Date   CHOLHDL 4.5 02/12/2019   Lab Results  Component Value Date   HGBA1C 6.4 (H) 06/11/2019      Assessment & Plan:     1. Type 2 diabetes, controlled, with peripheral neuropathy (HCC) - His last HgbA1c was 6.4%, he was advised to continue to take 500 mg bid. -Use Diabetic diet as advised  -Check blood sugar once before breakfast ,write down the numbers ,bring log to clinic every visit -Take medications regularly as advised -Regular exercise - metFORMIN (GLUCOPHAGE) 500 MG tablet; Take 1 tablet (500 mg total) by mouth 2 (two) times daily with a meal. (best if taking 15-20 minutes before biggest meal of the day)  Dispense: 60 tablet; Refill: 1 - gabapentin (NEURONTIN) 100 MG capsule; Take 1 capsule (100 mg total) by mouth 2 (two) times daily.  Dispense: 60 capsule; Refill: 0  2. Osteoarthritis of both knees, unspecified osteoarthritis type - He was encouraged to exercise and Orthopedic referral.  3. Essential hypertension - He will continue on current treatment regimen, advised to check and record blood pressure and bring log to office visit. He will continue on Dash diet and exercise as tolerated.  4. Metatarsalgia of left foot He will follow  up with- Ambulatory referral to Orthopedic Surgery   Follow-up: Return in about 27 days (around 07/15/2019), or if symptoms worsen or fail to improve.    Isidora Laham Jerold Coombe, NP

## 2019-06-30 ENCOUNTER — Ambulatory Visit: Payer: Medicaid Other | Admitting: Gerontology

## 2019-06-30 ENCOUNTER — Other Ambulatory Visit: Payer: Self-pay

## 2019-06-30 VITALS — BP 130/95

## 2019-06-30 DIAGNOSIS — R7303 Prediabetes: Secondary | ICD-10-CM

## 2019-06-30 DIAGNOSIS — M7742 Metatarsalgia, left foot: Secondary | ICD-10-CM

## 2019-06-30 DIAGNOSIS — I10 Essential (primary) hypertension: Secondary | ICD-10-CM

## 2019-06-30 MED ORDER — METFORMIN HCL 500 MG PO TABS: 500.0000 mg | ORAL_TABLET | Freq: Two times a day (BID) | ORAL | 1 refills | Status: DC

## 2019-06-30 MED ORDER — METFORMIN HCL 500 MG PO TABS: 500.0000 mg | ORAL_TABLET | Freq: Two times a day (BID) | ORAL | 3 refills | Status: DC

## 2019-06-30 NOTE — Patient Instructions (Signed)
Carbohydrate Counting for Diabetes Mellitus, Adult  Carbohydrate counting is a method of keeping track of how many carbohydrates you eat. Eating carbohydrates naturally increases the amount of sugar (glucose) in the blood. Counting how many carbohydrates you eat helps keep your blood glucose within normal limits, which helps you manage your diabetes (diabetes mellitus). It is important to know how many carbohydrates you can safely have in each meal. This is different for every person. A diet and nutrition specialist (registered dietitian) can help you make a meal plan and calculate how many carbohydrates you should have at each meal and snack. Carbohydrates are found in the following foods:  Grains, such as breads and cereals.  Dried beans and soy products.  Starchy vegetables, such as potatoes, peas, and corn.  Fruit and fruit juices.  Milk and yogurt.  Sweets and snack foods, such as cake, cookies, candy, chips, and soft drinks. How do I count carbohydrates? There are two ways to count carbohydrates in food. You can use either of the methods or a combination of both. Reading "Nutrition Facts" on packaged food The "Nutrition Facts" list is included on the labels of almost all packaged foods and beverages in the [place] It includes:  The serving size.  Information about nutrients in each serving, including the grams (g) of carbohydrate per serving. To use the "Nutrition Facts":  Decide how many servings you will have.  Multiply the number of servings by the number of carbohydrates per serving.  The resulting number is the total amount of carbohydrates that you will be having. Learning standard serving sizes of other foods When you eat carbohydrate foods that are not packaged or do not include "Nutrition Facts" on the label, you need to measure the servings in order to count the amount of carbohydrates:  Measure the foods that you will eat with a food scale or measuring cup, if needed.   Decide how many standard-size servings you will eat.  Multiply the number of servings by 15. Most carbohydrate-rich foods have about 15 g of carbohydrates per serving. ? For example, if you eat 8 oz (170 g) of strawberries, you will have eaten 2 servings and 30 g of carbohydrates (2 servings x 15 g = 30 g).  For foods that have more than one food mixed, such as soups and casseroles, you must count the carbohydrates in each food that is included. The following list contains standard serving sizes of common carbohydrate-rich foods. Each of these servings has about 15 g of carbohydrates:   hamburger bun or  English muffin.   oz (15 mL) syrup.   oz (14 g) jelly.  1 slice of bread.  1 six-inch tortilla.  3 oz (85 g) cooked rice or pasta.  4 oz (113 g) cooked dried beans.  4 oz (113 g) starchy vegetable, such as peas, corn, or potatoes.  4 oz (113 g) hot cereal.  4 oz (113 g) mashed potatoes or  of a large baked potato.  4 oz (113 g) canned or frozen fruit.  4 oz (120 mL) fruit juice.  4-6 crackers.  6 chicken nuggets.  6 oz (170 g) unsweetened dry cereal.  6 oz (170 g) plain fat-free yogurt or yogurt sweetened with artificial sweeteners.  8 oz (240 mL) milk.  8 oz (170 g) fresh fruit or one small piece of fruit.  24 oz (680 g) popped popcorn. Example of carbohydrate counting Sample meal  3 oz (85 g) chicken breast.  6 oz (170 g)   brown rice.  4 oz (113 g) corn.  8 oz (240 mL) milk.  8 oz (170 g) strawberries with sugar-free whipped topping. Carbohydrate calculation 1. Identify the foods that contain carbohydrates: ? Rice. ? Corn. ? Milk. ? Strawberries. 2. Calculate how many servings you have of each food: ? 2 servings rice. ? 1 serving corn. ? 1 serving milk. ? 1 serving strawberries. 3. Multiply each number of servings by 15 g: ? 2 servings rice x 15 g = 30 g. ? 1 serving corn x 15 g = 15 g. ? 1 serving milk x 15 g = 15 g. ? 1 serving  strawberries x 15 g = 15 g. 4. Add together all of the amounts to find the total grams of carbohydrates eaten: ? 30 g + 15 g + 15 g + 15 g = 75 g of carbohydrates total. Summary  Carbohydrate counting is a method of keeping track of how many carbohydrates you eat.  Eating carbohydrates naturally increases the amount of sugar (glucose) in the blood.  Counting how many carbohydrates you eat helps keep your blood glucose within normal limits, which helps you manage your diabetes.  A diet and nutrition specialist (registered dietitian) can help you make a meal plan and calculate how many carbohydrates you should have at each meal and snack. This information is not intended to replace advice given to you by your health care provider. Make sure you discuss any questions you have with your health care provider. Document Released: 10/30/2005 Document Revised: 05/24/2017 Document Reviewed: 04/12/2016 Elsevier Patient Education  2020 [employer]  

## 2019-06-30 NOTE — Progress Notes (Signed)
Established Patient Office Visit  Subjective:  Patient ID: Nathaniel Webb, male    DOB: 03/30/1966  Age: 53 y.o. MRN: [id number]  CC: No chief complaint on file. Patient consents to telephone visit and 2 patient identifiers was used to identify patient.  HPI Nathaniel MagesJohnny R Webb presents for concerns that he has no transportation to [facility] in [place] for his appointment and requests to be referred to an Orthopedic clinic in [place]. Unfortunately, [facility] has no affiliation with any Orthopedic clinic in [place] that accepts [facility] care. He reports that he will keep looking for transportation and will notify clinic. He reports checking his blood pressure at home, complying with his diet and medications. His blood pressure 2 days ago was 130/95. His last HgbA1c done 2 weeks ago was 6.4%, he checks his fasting blood glucose and his readings are in the low to mid 100's. He denies hypoglycemic/hyperglycemic symptoms. He states that he continues to experience discomfort to his left metatarsal and requests to be seen at the office during his next appointment. He denies chest pain, palpitation, light headedness, fever, chills and no further concerns.  Past Medical History:  Diagnosis Date  . Diabetes mellitus without complication (HCC)   . Heel pain 06/09/2015  . High triglycerides 05/09/2016  . Hypertension   . Low HDL (under 40) 05/09/2016  . Obesity, Class II, BMI 35-39.9, with comorbidity (HCC) 01/04/2016    No past surgical history on file.  Family History  Problem Relation Age of Onset  . Diabetes Father   . COPD Mother     Social History   Socioeconomic History  . Marital status: Single    Spouse name: Not on file  . Number of children: Not on file  . Years of education: Not on file  . Highest education level: Not on file  Occupational History  . Not on file  Social Needs  . Financial resource strain: Not on file  . Food insecurity    Worry: Not on file     Inability: Not on file  . Transportation needs    Medical: Not on file    Non-medical: Not on file  Tobacco Use  . Smoking status: Former Games developermoker  . Smokeless tobacco: Never Used  Substance and Sexual Activity  . Alcohol use: No    Alcohol/week: 0.0 standard drinks  . Drug use: No  . Sexual activity: Yes    Partners: Female    Birth control/protection: None  Lifestyle  . Physical activity    Days per week: Not on file    Minutes per session: Not on file  . Stress: Not on file  Relationships  . Social Musicianconnections    Talks on phone: Not on file    Gets together: Not on file    Attends religious service: Not on file    Active member of club or organization: Not on file    Attends meetings of clubs or organizations: Not on file    Relationship status: Not on file  . Intimate partner violence    Fear of current or ex partner: Not on file    Emotionally abused: Not on file    Physically abused: Not on file    Forced sexual activity: Not on file  Other Topics Concern  . Not on file  Social History Narrative  . Not on file    Outpatient Medications Prior to Visit  Medication Sig Dispense Refill  . Acetaminophen 500 MG coapsule Take  by mouth.    Marland Kitchen atorvastatin (LIPITOR) 10 MG tablet Take 1 tablet (10 mg total) by mouth at bedtime. 90 tablet 2  . BAYER ASPIRIN EC LOW DOSE 81 MG EC tablet TAKE ONE TABLET BY MOUTH EVERY DAY 96 tablet 0  . Blood Gluc Meter Disp-Strips (BLOOD GLUCOSE METER DISPOSABLE) DEVI For Contour Check FSBS once a day on average, dx E11.9, LON 99 months 100 each 0  . Blood Glucose Monitoring Suppl (CONTOUR BLOOD GLUCOSE SYSTEM) DEVI Check FSBS once a day on average, dx E11.9, LON 99 months 1 Device 0  . fluticasone (FLONASE) 50 MCG/ACT nasal spray Place 2 sprays into both nostrils daily. 16 g 0  . gabapentin (NEURONTIN) 100 MG capsule Take 1 capsule (100 mg total) by mouth 2 (two) times daily. 60 capsule 0  . Lancets (ACCU-CHEK SOFT TOUCH) lancets Check FSBS  once a day on average, dx E11.9, LON 99 months 100 each 1  . lisinopril (PRINIVIL,ZESTRIL) 20 MG tablet Take 1 tablet (20 mg total) by mouth daily. 90 tablet 2  . omega-3 acid ethyl esters (LOVAZA) 1 g capsule Take 2 capsules (2 g total) by mouth 2 (two) times daily. (this replaces your fish oil) 180 capsule 2  . metFORMIN (GLUCOPHAGE) 500 MG tablet Take 1 tablet (500 mg total) by mouth 2 (two) times daily with a meal. (best if taking 15-20 minutes before biggest meal of the day) 60 tablet 1   No facility-administered medications prior to visit.     No Known Allergies  ROS Review of Systems  Constitutional: Negative.   Respiratory: Negative.   Cardiovascular: Negative.   Gastrointestinal: Negative.   Musculoskeletal:       Chronic left metatarsalgia  Skin: Negative.   Neurological: Negative.   Psychiatric/Behavioral: Negative.       Objective:    Physical Exam No vital sign or PE was done BP (!) 130/95 Comment: per patient taking at home Wt Readings from Last 3 Encounters:  11/20/18 242 lb 9.6 oz (110 kg)  11/11/18 234 lb (106.1 kg)  08/15/18 242 lb 8 oz (110 kg)     Health Maintenance Due  Topic Date Due  . PNEUMOCOCCAL POLYSACCHARIDE VACCINE AGE 66-64 HIGH RISK  10/17/1969  . HIV Screening  10/17/1982  . COLONOSCOPY  10/17/2017  . INFLUENZA VACCINE  06/14/2019    There are no preventive care reminders to display for this patient.  Lab Results  Component Value Date   TSH 1.320 01/24/2018   Lab Results  Component Value Date   WBC 7.8 06/11/2019   HGB 13.5 06/11/2019   HCT 39.1 06/11/2019   MCV 79 06/11/2019   PLT 260 06/11/2019   Lab Results  Component Value Date   NA 137 06/11/2019   K 4.0 06/11/2019   CO2 25 06/11/2019   GLUCOSE 112 (H) 06/11/2019   BUN 10 06/11/2019   CREATININE 0.87 06/11/2019   BILITOT 0.3 06/11/2019   ALKPHOS 82 06/11/2019   AST 22 06/11/2019   ALT 25 06/11/2019   PROT 7.3 06/11/2019   ALBUMIN 4.4 06/11/2019   CALCIUM 9.4  06/11/2019   Lab Results  Component Value Date   CHOL 107 02/12/2019   Lab Results  Component Value Date   HDL 24 (L) 02/12/2019   Lab Results  Component Value Date   LDLCALC 61 02/12/2019   Lab Results  Component Value Date   TRIG 109 02/12/2019   Lab Results  Component Value Date   CHOLHDL 4.5 02/12/2019  Lab Results  Component Value Date   HGBA1C 6.4 (H) 06/11/2019      Assessment & Plan:    1. Essential hypertension - His blood pressure is controlled, he will continue on current treatment regimen. -Low salt DASH diet -Take medications regularly on time -Exercise regularly as tolerated -Check blood pressure at least once a week,record and bring log to office visit. -Goal is less than 140/90 and normal blood pressure is 120/80    2. Prediabetes - He will continue on current treatment regimen, continue to check blood glucose, record and bring log to visit. - metFORMIN (GLUCOPHAGE) 500 MG tablet; Take 1 tablet (500 mg total) by mouth 2 (two) times daily with a meal. (best if taking 15-20 minutes before biggest meal of the day)  Dispense: 60 tablet; Refill: 3  3. Metatarsalgia of left foot - He will continue to wearNew Balance shoes - He was advised to notify clinic with worsening symptoms.    Follow-up: Return in about 29 days (around 07/29/2019), or if symptoms worsen or fail to improve.    Stalin Gruenberg Trellis PaganiniE Jin Shockley, NP

## 2019-07-01 ENCOUNTER — Other Ambulatory Visit: Payer: Self-pay

## 2019-07-01 ENCOUNTER — Other Ambulatory Visit: Payer: Self-pay | Admitting: Internal Medicine

## 2019-07-01 DIAGNOSIS — I10 Essential (primary) hypertension: Secondary | ICD-10-CM

## 2019-07-01 MED ORDER — LISINOPRIL 20 MG PO TABS: 20.0000 mg | ORAL_TABLET | Freq: Every day | ORAL | 2 refills | Status: DC

## 2019-07-01 MED ORDER — ATORVASTATIN CALCIUM 10 MG PO TABS: 10.0000 mg | ORAL_TABLET | Freq: Every day | ORAL | 2 refills | Status: DC

## 2019-07-29 ENCOUNTER — Other Ambulatory Visit: Payer: Self-pay

## 2019-07-29 ENCOUNTER — Ambulatory Visit: Payer: Medicaid Other | Admitting: Gerontology

## 2019-07-29 VITALS — BP 113/79 | HR 62 | Ht 65.0 in | Wt 242.3 lb

## 2019-07-29 DIAGNOSIS — M7742 Metatarsalgia, left foot: Secondary | ICD-10-CM

## 2019-07-29 DIAGNOSIS — E1142 Type 2 diabetes mellitus with diabetic polyneuropathy: Secondary | ICD-10-CM

## 2019-07-29 DIAGNOSIS — E781 Pure hyperglyceridemia: Secondary | ICD-10-CM

## 2019-07-29 DIAGNOSIS — Z0000 Encounter for general adult medical examination without abnormal findings: Secondary | ICD-10-CM | POA: Insufficient documentation

## 2019-07-29 DIAGNOSIS — I10 Essential (primary) hypertension: Secondary | ICD-10-CM

## 2019-07-29 NOTE — Patient Instructions (Signed)
Carbohydrate Counting for Diabetes Mellitus, Adult  Carbohydrate counting is a method of keeping track of how many carbohydrates you eat. Eating carbohydrates naturally increases the amount of sugar (glucose) in the blood. Counting how many carbohydrates you eat helps keep your blood glucose within normal limits, which helps you manage your diabetes (diabetes mellitus). It is important to know how many carbohydrates you can safely have in each meal. This is different for every person. A diet and nutrition specialist (registered dietitian) can help you make a meal plan and calculate how many carbohydrates you should have at each meal and snack. Carbohydrates are found in the following foods:  Grains, such as breads and cereals.  Dried beans and soy products.  Starchy vegetables, such as potatoes, peas, and corn.  Fruit and fruit juices.  Milk and yogurt.  Sweets and snack foods, such as cake, cookies, candy, chips, and soft drinks. How do I count carbohydrates? There are two ways to count carbohydrates in food. You can use either of the methods or a combination of both. Reading "Nutrition Facts" on packaged food The "Nutrition Facts" list is included on the labels of almost all packaged foods and beverages in the [place] It includes:  The serving size.  Information about nutrients in each serving, including the grams (g) of carbohydrate per serving. To use the "Nutrition Facts":  Decide how many servings you will have.  Multiply the number of servings by the number of carbohydrates per serving.  The resulting number is the total amount of carbohydrates that you will be having. Learning standard serving sizes of other foods When you eat carbohydrate foods that are not packaged or do not include "Nutrition Facts" on the label, you need to measure the servings in order to count the amount of carbohydrates:  Measure the foods that you will eat with a food scale or measuring cup, if needed.   Decide how many standard-size servings you will eat.  Multiply the number of servings by 15. Most carbohydrate-rich foods have about 15 g of carbohydrates per serving. ? For example, if you eat 8 oz (170 g) of strawberries, you will have eaten 2 servings and 30 g of carbohydrates (2 servings x 15 g = 30 g).  For foods that have more than one food mixed, such as soups and casseroles, you must count the carbohydrates in each food that is included. The following list contains standard serving sizes of common carbohydrate-rich foods. Each of these servings has about 15 g of carbohydrates:   hamburger bun or  English muffin.   oz (15 mL) syrup.   oz (14 g) jelly.  1 slice of bread.  1 six-inch tortilla.  3 oz (85 g) cooked rice or pasta.  4 oz (113 g) cooked dried beans.  4 oz (113 g) starchy vegetable, such as peas, corn, or potatoes.  4 oz (113 g) hot cereal.  4 oz (113 g) mashed potatoes or  of a large baked potato.  4 oz (113 g) canned or frozen fruit.  4 oz (120 mL) fruit juice.  4-6 crackers.  6 chicken nuggets.  6 oz (170 g) unsweetened dry cereal.  6 oz (170 g) plain fat-free yogurt or yogurt sweetened with artificial sweeteners.  8 oz (240 mL) milk.  8 oz (170 g) fresh fruit or one small piece of fruit.  24 oz (680 g) popped popcorn. Example of carbohydrate counting Sample meal  3 oz (85 g) chicken breast.  6 oz (170 g)   brown rice.  4 oz (113 g) corn.  8 oz (240 mL) milk.  8 oz (170 g) strawberries with sugar-free whipped topping. Carbohydrate calculation 1. Identify the foods that contain carbohydrates: ? Rice. ? Corn. ? Milk. ? Strawberries. 2. Calculate how many servings you have of each food: ? 2 servings rice. ? 1 serving corn. ? 1 serving milk. ? 1 serving strawberries. 3. Multiply each number of servings by 15 g: ? 2 servings rice x 15 g = 30 g. ? 1 serving corn x 15 g = 15 g. ? 1 serving milk x 15 g = 15 g. ? 1 serving  strawberries x 15 g = 15 g. 4. Add together all of the amounts to find the total grams of carbohydrates eaten: ? 30 g + 15 g + 15 g + 15 g = 75 g of carbohydrates total. Summary  Carbohydrate counting is a method of keeping track of how many carbohydrates you eat.  Eating carbohydrates naturally increases the amount of sugar (glucose) in the blood.  Counting how many carbohydrates you eat helps keep your blood glucose within normal limits, which helps you manage your diabetes.  A diet and nutrition specialist (registered dietitian) can help you make a meal plan and calculate how many carbohydrates you should have at each meal and snack. This information is not intended to replace advice given to you by your health care provider. Make sure you discuss any questions you have with your health care provider. Document Released: 10/30/2005 Document Revised: 05/24/2017 Document Reviewed: 04/12/2016 Elsevier Patient Education  2020 [employer]  

## 2019-07-29 NOTE — Progress Notes (Signed)
Established Patient Office Visit  Subjective:  Patient ID: Nathaniel Webb, male    DOB: 1966-08-16  Age: 53 y.o. MRN: [id number]  CC:  Chief Complaint  Patient presents with  . Hypertension    HPI Nathaniel Webb presents for follow up of hypertension, controlled type 2 diabetes mellitus and metatarsalgia of left foot. He is compliant with his medication, dash  and low carb/no concentrated sweet diet. He checks his blood pressure at home and he states that it's usually below 130/80. He monitors his blood glucose weekly and reports that it's usually between 80- 130 mg/dl. He also states that he continues to experience intermittent stiffness to metatarsal area of his left foot, after standing for more than 3 hours. He states that it affects his ability to be employed because he can't stand or walk around for hours. He states that wearing New balance shoes offered minimal relief. He states that he hasn't had colonoscopy done. He denies chest pain, palpitation, fever, chills, he states that he's doing well and offers no further complaint.  Past Medical History:  Diagnosis Date  . Diabetes mellitus without complication ([place])   . Heel pain 06/09/2015  . High triglycerides 05/09/2016  . Hypertension   . Low HDL (under 40) 05/09/2016  . Obesity, Class II, BMI 35-39.9, with comorbidity ([place]) 01/04/2016    No past surgical history on file.  Family History  Problem Relation Age of Onset  . Diabetes Father   . COPD Mother     Social History   Socioeconomic History  . Marital status: Single    Spouse name: Not on file  . Number of children: Not on file  . Years of education: Not on file  . Highest education level: Not on file  Occupational History  . Not on file  Social Needs  . Financial resource strain: Not on file  . Food insecurity    Worry: Not on file    Inability: Not on file  . Transportation needs    Medical: Not on file    Non-medical: Not on file  Tobacco Use  .  Smoking status: Former Research scientist (life sciences)  . Smokeless tobacco: Never Used  Substance and Sexual Activity  . Alcohol use: No    Alcohol/week: 0.0 standard drinks  . Drug use: No  . Sexual activity: Yes    Partners: Female    Birth control/protection: None  Lifestyle  . Physical activity    Days per week: Not on file    Minutes per session: Not on file  . Stress: Not on file  Relationships  . Social Herbalist on phone: Not on file    Gets together: Not on file    Attends religious service: Not on file    Active member of club or organization: Not on file    Attends meetings of clubs or organizations: Not on file    Relationship status: Not on file  . Intimate partner violence    Fear of current or ex partner: Not on file    Emotionally abused: Not on file    Physically abused: Not on file    Forced sexual activity: Not on file  Other Topics Concern  . Not on file  Social History Narrative  . Not on file    Outpatient Medications Prior to Visit  Medication Sig Dispense Refill  . atorvastatin (LIPITOR) 10 MG tablet Take 1 tablet (10 mg total) by mouth at bedtime. 90 tablet 2  .  BAYER ASPIRIN EC LOW DOSE 81 MG EC tablet TAKE ONE TABLET BY MOUTH EVERY DAY 96 tablet 0  . Blood Gluc Meter Disp-Strips (BLOOD GLUCOSE METER DISPOSABLE) DEVI For Contour Check FSBS once a day on average, dx E11.9, LON 99 months 100 each 0  . Blood Glucose Monitoring Suppl (CONTOUR BLOOD GLUCOSE SYSTEM) DEVI Check FSBS once a day on average, dx E11.9, LON 99 months 1 Device 0  . fluticasone (FLONASE) 50 MCG/ACT nasal spray Place 2 sprays into both nostrils daily. 16 g 0  . gabapentin (NEURONTIN) 100 MG capsule Take 1 capsule (100 mg total) by mouth 2 (two) times daily. 60 capsule 0  . Lancets (ACCU-CHEK SOFT TOUCH) lancets Check FSBS once a day on average, dx E11.9, LON 99 months 100 each 1  . lisinopril (ZESTRIL) 20 MG tablet Take 1 tablet (20 mg total) by mouth daily. 90 tablet 2  . metFORMIN  (GLUCOPHAGE) 500 MG tablet Take 1 tablet (500 mg total) by mouth 2 (two) times daily with a meal. (best if taking 15-20 minutes before biggest meal of the day) 60 tablet 3  . omega-3 acid ethyl esters (LOVAZA) 1 g capsule Take 2 capsules (2 g total) by mouth 2 (two) times daily. (this replaces your fish oil) 180 capsule 2  . Acetaminophen 500 MG coapsule Take by mouth.     No facility-administered medications prior to visit.     No Known Allergies  ROS Review of Systems  Constitutional: Negative.   HENT: Negative.   Respiratory: Negative.   Cardiovascular: Negative.   Gastrointestinal: Negative.   Musculoskeletal: Positive for arthralgias (stiffness to metatarsal area of left foot.).  Skin: Negative.   Neurological: Negative.   Psychiatric/Behavioral: Negative.       Objective:    Physical Exam  Constitutional: He is oriented to person, place, and time. He appears well-developed and well-nourished.  HENT:  Head: Normocephalic and atraumatic.  Eyes: Pupils are equal, round, and reactive to light. EOM are normal.  Neck: Normal range of motion.  Cardiovascular: Normal rate and regular rhythm.  Pulmonary/Chest: Effort normal and breath sounds normal.  Musculoskeletal: Normal range of motion.  Neurological: He is alert and oriented to person, place, and time. He has normal reflexes.  Skin: Skin is warm and dry.  Psychiatric: He has a normal mood and affect. His behavior is normal. Judgment and thought content normal.    BP 113/79 (BP Location: Left Arm, Patient Position: Sitting)   Pulse 62   Ht '5\' 5"'  (1.651 m)   Wt 242 lb 4.8 oz (109.9 kg)   SpO2 98%   BMI 40.32 kg/m  Wt Readings from Last 3 Encounters:  07/29/19 242 lb 4.8 oz (109.9 kg)  11/20/18 242 lb 9.6 oz (110 kg)  11/11/18 234 lb (106.1 kg)   He was advised to continue on weight loss regimen.  Health Maintenance Due  Topic Date Due  . PNEUMOCOCCAL POLYSACCHARIDE VACCINE AGE 19-64 HIGH RISK  10/17/1969  . HIV  Screening  10/17/1982  . COLONOSCOPY  10/17/2017  . INFLUENZA VACCINE  06/14/2019    There are no preventive care reminders to display for this patient.  Lab Results  Component Value Date   TSH 1.320 01/24/2018   Lab Results  Component Value Date   WBC 7.8 06/11/2019   HGB 13.5 06/11/2019   HCT 39.1 06/11/2019   MCV 79 06/11/2019   PLT 260 06/11/2019   Lab Results  Component Value Date   NA 137 06/11/2019  K 4.0 06/11/2019   CO2 25 06/11/2019   GLUCOSE 112 (H) 06/11/2019   BUN 10 06/11/2019   CREATININE 0.87 06/11/2019   BILITOT 0.3 06/11/2019   ALKPHOS 82 06/11/2019   AST 22 06/11/2019   ALT 25 06/11/2019   PROT 7.3 06/11/2019   ALBUMIN 4.4 06/11/2019   CALCIUM 9.4 06/11/2019   Lab Results  Component Value Date   CHOL 107 02/12/2019   Lab Results  Component Value Date   HDL 24 (L) 02/12/2019   Lab Results  Component Value Date   LDLCALC 61 02/12/2019   Lab Results  Component Value Date   TRIG 109 02/12/2019   Lab Results  Component Value Date   CHOLHDL 4.5 02/12/2019   Lab Results  Component Value Date   HGBA1C 6.4 (H) 06/11/2019      Assessment & Plan:     1. Type 2 diabetes, controlled, with peripheral neuropathy (HCC) - His HgbA1c done 1 month ago was 6.4%, and he was advised to continue on current treatment regimen. He was advised that fasting blood glucose should be between 80-130 mg/dl. -Use Diabetic diet as advised  -Check blood sugar once a day, once before breakfast,write down the numbers against date in a log and bring log to clinic every visit -Take medications regularly as advised -Regular exercise as tolerated - HgB A1c; Future  2. Metatarsalgia of left foot - He was advised to schedule an appointment with Dr Amalia Hailey at [place] - He was advised to notify clinic for worsening symptoms.  3. Essential hypertension - His blood pressure is controlled, and will continue on current treatment regimen. -Low salt DASH  diet -Take medications regularly on time -Exercise regularly as tolerated -Check blood pressure at least once a week, record and bring log to office visit. -Goal is less than 140/90 and normal blood pressure is 120/80  - Comp Met (CMET); Future  4. Health care maintenance -Routine labs will be rechecked. - CBC w/Diff; Future - Urinalysis; Future - He was advised to complete charity care application for - Ambulatory referral to Gastroenterology  5. High triglycerides - He will continue on current treatment regimen and - Lipid panel will be rechecked in future.   Follow-up: Return in about 18 weeks (around 12/02/2019), or if symptoms worsen or fail to improve.    Julanne Schlueter Jerold Coombe, NP

## 2019-08-28 ENCOUNTER — Ambulatory Visit: Payer: Self-pay | Admitting: Ophthalmology

## 2019-10-14 ENCOUNTER — Other Ambulatory Visit: Payer: Self-pay

## 2019-10-14 ENCOUNTER — Encounter: Payer: Self-pay | Admitting: *Deleted

## 2019-10-14 ENCOUNTER — Ambulatory Visit: Payer: No Typology Code available for payment source | Admitting: Podiatry

## 2019-10-14 DIAGNOSIS — M7742 Metatarsalgia, left foot: Secondary | ICD-10-CM

## 2019-10-14 DIAGNOSIS — M7741 Metatarsalgia, right foot: Secondary | ICD-10-CM

## 2019-10-14 DIAGNOSIS — M7752 Other enthesopathy of left foot: Secondary | ICD-10-CM

## 2019-10-17 NOTE — Progress Notes (Signed)
   HPI: 53- year-old male presenting today for follow up evaluation of left foot pain. He states the pain has worsened. He reports associated swelling. He states the injection helped alleviate the pain temporarily. He has stopped taking the Meloxicam since his last visit. Being on the foot for long periods of time increases the pain. Patient is here for further evaluation and treatment.   Past Medical History:  Diagnosis Date  . Diabetes mellitus without complication ([place])   . Heel pain 06/09/2015  . High triglycerides 05/09/2016  . Hypertension   . Low HDL (under 40) 05/09/2016  . Obesity, Class II, BMI 35-39.9, with comorbidity ([place]) 01/04/2016     Physical Exam: General: The patient is alert and oriented x3 in no acute distress.  Dermatology: Skin is warm, dry and supple bilateral lower extremities. Negative for open lesions or macerations.  Vascular: Palpable pedal pulses bilaterally. No edema or erythema noted. Capillary refill within normal limits.  Neurological: Epicritic and protective threshold grossly intact bilaterally.   Musculoskeletal Exam: Pain with palpation noted to the 2nd MPJ of the left foot. Range of motion within normal limits to all pedal and ankle joints bilateral. Muscle strength 5/5 in all groups bilateral.   Assessment: 1. 2nd MPJ capsulitis left    Plan of Care:  1. Patient evaluated.  2. Injection of 0.5 mLs Celestone Soluspan injected into the 2nd MPJ of the left foot.  3. Met pads dispensed.  4. Continue wearing New Balance shoes.  5. Return to clinic in 4 weeks.        Edrick Kins, DPM [facility]  Dr. Edrick Kins, DPM    [address], [place] [address]                Office [phone]  Fax [phone]

## 2019-10-20 ENCOUNTER — Other Ambulatory Visit: Payer: Self-pay

## 2019-10-20 ENCOUNTER — Telehealth: Payer: Self-pay | Admitting: Gastroenterology

## 2019-10-20 DIAGNOSIS — Z1211 Encounter for screening for malignant neoplasm of colon: Secondary | ICD-10-CM

## 2019-10-20 NOTE — Telephone Encounter (Signed)
Pt left vm to schedule a colonoscopy   °

## 2019-10-20 NOTE — Telephone Encounter (Signed)
Gastroenterology Pre-Procedure Review  Request Date: Friday 10/21/19 Requesting Physician: Dr. Marius Ditch  PATIENT REVIEW QUESTIONS: The patient responded to the following health history questions as indicated:    1. Are you having any GI issues? no 2. Do you have a personal history of Polyps? no 3. Do you have a family history of Colon Cancer or Polyps? no 4. Diabetes Mellitus? yes (oral meds) 5. Joint replacements in the past 12 months?no 6. Major health problems in the past 3 months?no 7. Any artificial heart valves, MVP, or defibrillator?no    MEDICATIONS & ALLERGIES:    Patient reports the following regarding taking any anticoagulation/antiplatelet therapy:   Plavix, Coumadin, Eliquis, Xarelto, Lovenox, Pradaxa, Brilinta, or Effient? no Aspirin? yes (81mg  daily)  Patient confirms/reports the following medications:  Current Outpatient Medications  Medication Sig Dispense Refill  . Acetaminophen 500 MG coapsule Take by mouth.    Marland Kitchen atorvastatin (LIPITOR) 10 MG tablet Take 1 tablet (10 mg total) by mouth at bedtime. 90 tablet 2  . BAYER ASPIRIN EC LOW DOSE 81 MG EC tablet TAKE ONE TABLET BY MOUTH EVERY DAY 96 tablet 0  . Blood Gluc Meter Disp-Strips (BLOOD GLUCOSE METER DISPOSABLE) DEVI For Contour Check FSBS once a day on average, dx E11.9, LON 99 months 100 each 0  . Blood Glucose Monitoring Suppl (CONTOUR BLOOD GLUCOSE SYSTEM) DEVI Check FSBS once a day on average, dx E11.9, LON 99 months 1 Device 0  . fluticasone (FLONASE) 50 MCG/ACT nasal spray Place 2 sprays into both nostrils daily. 16 g 0  . gabapentin (NEURONTIN) 100 MG capsule Take 1 capsule (100 mg total) by mouth 2 (two) times daily. 60 capsule 0  . Lancets (ACCU-CHEK SOFT TOUCH) lancets Check FSBS once a day on average, dx E11.9, LON 99 months 100 each 1  . lisinopril (ZESTRIL) 20 MG tablet Take 1 tablet (20 mg total) by mouth daily. 90 tablet 2  . metFORMIN (GLUCOPHAGE) 500 MG tablet Take 1 tablet (500 mg total) by mouth 2  (two) times daily with a meal. (best if taking 15-20 minutes before biggest meal of the day) 60 tablet 3  . omega-3 acid ethyl esters (LOVAZA) 1 g capsule Take 2 capsules (2 g total) by mouth 2 (two) times daily. (this replaces your fish oil) 180 capsule 2   No current facility-administered medications for this visit.     Patient confirms/reports the following allergies:  No Known Allergies  No orders of the defined types were placed in this encounter.   AUTHORIZATION INFORMATION Primary Insurance: 1D#: Group #:  Secondary Insurance: 1D#: Group #:  SCHEDULE INFORMATION: Date:Friday 11/21/19 Time: Location:[facility]

## 2019-11-05 ENCOUNTER — Encounter: Payer: Self-pay | Admitting: Emergency Medicine

## 2019-11-05 ENCOUNTER — Other Ambulatory Visit: Payer: Self-pay

## 2019-11-05 ENCOUNTER — Emergency Department: Payer: Medicaid Other

## 2019-11-05 ENCOUNTER — Emergency Department
Admission: EM | Admit: 2019-11-05 | Discharge: 2019-11-05 | Disposition: A | Discharge status: Home / Self Care | Payer: Medicaid Other | Attending: Emergency Medicine | Admitting: Emergency Medicine

## 2019-11-05 DIAGNOSIS — J189 Pneumonia, unspecified organism: Secondary | ICD-10-CM | POA: Insufficient documentation

## 2019-11-05 DIAGNOSIS — E119 Type 2 diabetes mellitus without complications: Secondary | ICD-10-CM | POA: Diagnosis not present

## 2019-11-05 DIAGNOSIS — E1140 Type 2 diabetes mellitus with diabetic neuropathy, unspecified: Secondary | ICD-10-CM | POA: Diagnosis not present

## 2019-11-05 DIAGNOSIS — U071 COVID-19: Secondary | ICD-10-CM | POA: Diagnosis not present

## 2019-11-05 DIAGNOSIS — Z7984 Long term (current) use of oral hypoglycemic drugs: Secondary | ICD-10-CM | POA: Diagnosis not present

## 2019-11-05 DIAGNOSIS — Z79899 Other long term (current) drug therapy: Secondary | ICD-10-CM | POA: Insufficient documentation

## 2019-11-05 DIAGNOSIS — R509 Fever, unspecified: Secondary | ICD-10-CM | POA: Diagnosis present

## 2019-11-05 DIAGNOSIS — I10 Essential (primary) hypertension: Secondary | ICD-10-CM | POA: Insufficient documentation

## 2019-11-05 DIAGNOSIS — Z87891 Personal history of nicotine dependence: Secondary | ICD-10-CM | POA: Diagnosis not present

## 2019-11-05 LAB — BASIC METABOLIC PANEL
Anion gap: 12 (ref 5–15)
BUN: 17 mg/dL (ref 6–20)
CO2: 24 mmol/L (ref 22–32)
Calcium: 8.5 mg/dL — ABNORMAL LOW (ref 8.9–10.3)
Chloride: 98 mmol/L (ref 98–111)
Creatinine, Ser: 1.08 mg/dL (ref 0.61–1.24)
GFR calc Af Amer: >60 mL/min (ref >60)
GFR calc non Af Amer: >60 mL/min (ref >60)
Glucose, Bld: 124 mg/dL — ABNORMAL HIGH (ref 70–99)
Potassium: 3.8 mmol/L (ref 3.5–5.1)
Sodium: 134 mmol/L — ABNORMAL LOW (ref 135–145)

## 2019-11-05 LAB — CBC
HCT: 40.6 % (ref 39.0–52.0)
Hemoglobin: 13.3 g/dL (ref 13.0–17.0)
MCH: 26 pg (ref 26.0–34.0)
MCHC: 32.8 g/dL (ref 30.0–36.0)
MCV: 79.5 fL — ABNORMAL LOW (ref 80.0–100.0)
Platelets: 161 10*3/uL (ref 150–400)
RBC: 5.11 MIL/uL (ref 4.22–5.81)
RDW: 14.2 % (ref 11.5–15.5)
WBC: 5.4 10*3/uL (ref 4.0–10.5)
nRBC: 0 % (ref 0.0–0.2)

## 2019-11-05 LAB — POC SARS CORONAVIRUS 2 AG: SARS Coronavirus 2 Ag: POSITIVE — AB

## 2019-11-05 MED ORDER — ACETAMINOPHEN 325 MG PO TABS
650.0000 mg | ORAL_TABLET | Freq: Once | ORAL | Status: AC | PRN
  Administered 2019-11-05: 650 mg via ORAL

## 2019-11-05 MED ORDER — SODIUM CHLORIDE 0.9% FLUSH: 3.0000 mL | Freq: Once | INTRAVENOUS | Status: DC

## 2019-11-05 NOTE — ED Provider Notes (Signed)
[facility] Emergency Department Provider Note   ____________________________________________    I have reviewed the triage vital signs and the nursing notes.   HISTORY  Chief Complaint Weakness, Fever, and Cough     HPI Nathaniel Webb is a 53 y.o. male who presents with complaints of fever, cough, weakness, myalgias.  Patient reports symptoms started approximately 1 week ago.  Does have a history of diabetes.  No known exposure to novel coronavirus.  Reports that his breathing is "okay ".  No nausea or vomiting.  Is not take anything for this.  Past Medical History:  Diagnosis Date  . Diabetes mellitus without complication ([place])   . Heel pain 06/09/2015  . High triglycerides 05/09/2016  . Hypertension   . Low HDL (under 40) 05/09/2016  . Obesity, Class II, BMI 35-39.9, with comorbidity ([place]) 01/04/2016    Patient Active Problem List   Diagnosis Date Noted  . Health care maintenance 07/29/2019  . Metatarsalgia of left foot 06/18/2019  . Prediabetes 05/08/2019  . Peripheral neuropathy 05/08/2019  . Osteoarthritis of both knees 05/08/2019  . Osteoarthritis of right knee 02/15/2017  . Hand cramps 11/09/2016  . Tinea pedis 06/04/2016  . Low HDL (under 40) 05/09/2016  . High triglycerides 05/09/2016  . Medication monitoring encounter 05/03/2016  . Controlled type 2 diabetes mellitus without complication, without long-term current use of insulin ([place]) 01/04/2016  . Needs flu shot 01/04/2016  . Morbid obesity ([place]) 01/04/2016  . Screening for STD (sexually transmitted disease) 01/04/2016  . Encounter for cholesteral screening for cardiovascular disease 01/04/2016  . AD (atopic dermatitis) 06/09/2015  . Chronic low back pain 06/09/2015  . Hypertension 06/09/2015  . Type 2 diabetes, controlled, with peripheral neuropathy ([place]) 12/23/2014    History reviewed. No pertinent surgical history.  Prior to Admission medications   Medication Sig Start  Date End Date Taking? Authorizing Provider  Acetaminophen 500 MG coapsule Take by mouth.    [provider]  atorvastatin (LIPITOR) 10 MG tablet Take 1 tablet (10 mg total) by mouth at bedtime. 07/01/19   Iloabachie, Chioma E, NP  BAYER ASPIRIN EC LOW DOSE 81 MG EC tablet TAKE ONE TABLET BY MOUTH EVERY DAY 06/10/19   Iloabachie, Chioma E, NP  Blood Gluc Meter Disp-Strips (BLOOD GLUCOSE METER DISPOSABLE) DEVI For Contour Check FSBS once a day on average, dx E11.9, LON 99 months 02/15/17   Lada, Satira Anis, MD  Blood Glucose Monitoring Suppl (CONTOUR BLOOD GLUCOSE SYSTEM) DEVI Check FSBS once a day on average, dx E11.9, LON 99 months 02/15/17   Lada, Satira Anis, MD  fluticasone (FLONASE) 50 MCG/ACT nasal spray Place 2 sprays into both nostrils daily. 08/15/18 08/15/19  Tukov-Yual, Arlyss Gandy, NP  gabapentin (NEURONTIN) 100 MG capsule Take 1 capsule (100 mg total) by mouth 2 (two) times daily. 06/18/19   Iloabachie, Chioma E, NP  Lancets (ACCU-CHEK SOFT TOUCH) lancets Check FSBS once a day on average, dx E11.9, LON 99 months 02/15/17   Lada, Satira Anis, MD  lisinopril (ZESTRIL) 20 MG tablet Take 1 tablet (20 mg total) by mouth daily. 07/01/19   Iloabachie, Chioma E, NP  metFORMIN (GLUCOPHAGE) 500 MG tablet Take 1 tablet (500 mg total) by mouth 2 (two) times daily with a meal. (best if taking 15-20 minutes before biggest meal of the day) 06/30/19   Iloabachie, Chioma E, NP  omega-3 acid ethyl esters (LOVAZA) 1 g capsule Take 2 capsules (2 g total) by mouth 2 (two) times  daily. (this replaces your fish oil) 08/15/18   Tukov-Yual, Alroy BailiffMagdalene S, NP     Allergies Patient has no known allergies.  Family History  Problem Relation Age of Onset  . Diabetes Father   . COPD Mother     Social History Social History   Tobacco Use  . Smoking status: Former Games developermoker  . Smokeless tobacco: Never Used  Substance Use Topics  . Alcohol use: No    Alcohol/week: 0.0 standard drinks  . Drug use: No    Review of  Systems  Constitutional: As above Eyes: No visual changes.  ENT: No sore throat. Cardiovascular: Denies chest pain. Respiratory: As above Gastrointestinal: No abdominal pain.   Genitourinary: Negative for dysuria. Musculoskeletal: Some myalgias Skin: Negative for rash. Neurological: Negative for headaches or weakness   ____________________________________________   PHYSICAL EXAM:  VITAL SIGNS: ED Triage Vitals [11/05/19 1214]  Enc Vitals Group     BP (!) 128/105     Pulse Rate 79     Resp 20     Temp (!) 101.3 F (38.5 C)     Temp Source Oral     SpO2 93 %     Weight 106.1 kg (234 lb)     Height 1.651 m (5\' 5" )     Head Circumference      Peak Flow      Pain Score 0     Pain Loc      Pain Edu?      Excl. in GC?     Constitutional: Alert and oriented  Nose: No congestion/rhinnorhea. Mouth/Throat: Mucous membranes are moist.    Cardiovascular: Normal rate, regular rhythm. Grossly normal heart sounds.  Good peripheral circulation. Respiratory: Normal respiratory effort.  No retractions.  Gastrointestinal: Soft and nontender. No distention.   Musculoskeletal: No lower extremity tenderness nor edema.  Warm and well perfused Neurologic:  Normal speech and language. No gross focal neurologic deficits are appreciated.  Skin:  Skin is warm, dry and intact. No rash noted. Psychiatric: Mood and affect are normal. Speech and behavior are normal.  ____________________________________________   LABS (all labs ordered are listed, but only abnormal results are displayed)  Labs Reviewed  BASIC METABOLIC PANEL - Abnormal; Notable for the following components:      Result Value   Sodium 134 (*)    Glucose, Bld 124 (*)    Calcium 8.5 (*)    All other components within normal limits  CBC - Abnormal; Notable for the following components:   MCV 79.5 (*)    All other components within normal limits  URINALYSIS, COMPLETE (UACMP) WITH MICROSCOPIC  POC SARS CORONAVIRUS 2 AG -   ED   ____________________________________________  EKG  ED ECG REPORT I, Jene Everyobert Nickolus Wadding, the attending physician, personally viewed and interpreted this ECG.  Date: 11/05/2019  Rhythm: normal sinus rhythm QRS Axis: normal Intervals: normal ST/T Wave abnormalities: normal Narrative Interpretation: no evidence of acute ischemia  ____________________________________________  RADIOLOGY  Chest x-ray concerning for multifocal atypical pneumonia ____________________________________________   PROCEDURES  Procedure(s) performed: No  Procedures   Critical Care performed: No ____________________________________________   INITIAL IMPRESSION / ASSESSMENT AND PLAN / ED COURSE  Pertinent labs & imaging results that were available during my care of the patient were reviewed by me and considered in my medical decision making (see chart for details).  Patient presents with symptoms of novel coronavirus which is prevalent in the community at this time.  Febrile here however oxygen saturation 95% in the  room.  Chest x-ray consistent with Covid.  Pending rapid Covid.  Lab work overall is quite reassuring.  POC Covid swab is positive, discussed quarantine contact precautions, return precautions    ____________________________________________   FINAL CLINICAL IMPRESSION(S) / ED DIAGNOSES  Final diagnoses:  Pneumonia due to COVID-19 virus        Note:  This document was prepared using Dragon voice recognition software and may include unintentional dictation errors.   Jene Every, MD 11/05/19 1344

## 2019-11-05 NOTE — ED Triage Notes (Signed)
Pt presents to ED via POV with c/o cough, intermittent fever and generalized weakness x 1 week. Pt states has been taken Tylenol for fevers, last dose 2 days ago. Pt noted to be febrile upon arrival to ED.

## 2019-11-09 ENCOUNTER — Inpatient Hospital Stay
Admission: EM | Admit: 2019-11-09 | Discharge: 2019-11-11 | DRG: 177 | Disposition: A | Discharge status: Other Acute Inpatient Hospital | Payer: Medicaid Other | Attending: Family Medicine | Admitting: Family Medicine

## 2019-11-09 ENCOUNTER — Other Ambulatory Visit: Payer: Self-pay

## 2019-11-09 ENCOUNTER — Emergency Department: Payer: Medicaid Other

## 2019-11-09 DIAGNOSIS — U071 COVID-19: Principal | ICD-10-CM | POA: Diagnosis present

## 2019-11-09 DIAGNOSIS — J1289 Other viral pneumonia: Secondary | ICD-10-CM

## 2019-11-09 DIAGNOSIS — J9601 Acute respiratory failure with hypoxia: Secondary | ICD-10-CM | POA: Diagnosis present

## 2019-11-09 DIAGNOSIS — J9600 Acute respiratory failure, unspecified whether with hypoxia or hypercapnia: Secondary | ICD-10-CM

## 2019-11-09 DIAGNOSIS — Z7982 Long term (current) use of aspirin: Secondary | ICD-10-CM | POA: Diagnosis not present

## 2019-11-09 DIAGNOSIS — E1142 Type 2 diabetes mellitus with diabetic polyneuropathy: Secondary | ICD-10-CM | POA: Diagnosis present

## 2019-11-09 DIAGNOSIS — Z8619 Personal history of other infectious and parasitic diseases: Secondary | ICD-10-CM | POA: Diagnosis not present

## 2019-11-09 DIAGNOSIS — N179 Acute kidney failure, unspecified: Secondary | ICD-10-CM | POA: Diagnosis present

## 2019-11-09 DIAGNOSIS — Z833 Family history of diabetes mellitus: Secondary | ICD-10-CM

## 2019-11-09 DIAGNOSIS — I10 Essential (primary) hypertension: Secondary | ICD-10-CM | POA: Diagnosis present

## 2019-11-09 DIAGNOSIS — J1282 Pneumonia due to coronavirus disease 2019: Secondary | ICD-10-CM | POA: Diagnosis present

## 2019-11-09 DIAGNOSIS — Z7984 Long term (current) use of oral hypoglycemic drugs: Secondary | ICD-10-CM | POA: Diagnosis not present

## 2019-11-09 DIAGNOSIS — E860 Dehydration: Secondary | ICD-10-CM | POA: Diagnosis present

## 2019-11-09 DIAGNOSIS — Z825 Family history of asthma and other chronic lower respiratory diseases: Secondary | ICD-10-CM | POA: Diagnosis not present

## 2019-11-09 DIAGNOSIS — E441 Mild protein-calorie malnutrition: Secondary | ICD-10-CM | POA: Diagnosis present

## 2019-11-09 DIAGNOSIS — R0902 Hypoxemia: Secondary | ICD-10-CM

## 2019-11-09 DIAGNOSIS — Z79899 Other long term (current) drug therapy: Secondary | ICD-10-CM | POA: Diagnosis not present

## 2019-11-09 DIAGNOSIS — J159 Unspecified bacterial pneumonia: Secondary | ICD-10-CM | POA: Diagnosis present

## 2019-11-09 DIAGNOSIS — R0602 Shortness of breath: Secondary | ICD-10-CM | POA: Diagnosis present

## 2019-11-09 DIAGNOSIS — Z6838 Body mass index (BMI) 38.0-38.9, adult: Secondary | ICD-10-CM

## 2019-11-09 DIAGNOSIS — E785 Hyperlipidemia, unspecified: Secondary | ICD-10-CM | POA: Diagnosis present

## 2019-11-09 DIAGNOSIS — Z87891 Personal history of nicotine dependence: Secondary | ICD-10-CM

## 2019-11-09 DIAGNOSIS — K0889 Other specified disorders of teeth and supporting structures: Secondary | ICD-10-CM | POA: Diagnosis present

## 2019-11-09 HISTORY — DX: Acute respiratory failure, unspecified whether with hypoxia or hypercapnia: J96.00

## 2019-11-09 LAB — CBC WITH DIFFERENTIAL/PLATELET
Abs Immature Granulocytes: 0.18 10*3/uL — ABNORMAL HIGH (ref 0.00–0.07)
Basophils Absolute: 0 10*3/uL (ref 0.0–0.1)
Basophils Relative: 0 %
Eosinophils Absolute: 0 10*3/uL (ref 0.0–0.5)
Eosinophils Relative: 0 %
HCT: 45 % (ref 39.0–52.0)
Hemoglobin: 14.6 g/dL (ref 13.0–17.0)
Immature Granulocytes: 1 %
Lymphocytes Relative: 15 %
Lymphs Abs: 2 10*3/uL (ref 0.7–4.0)
MCH: 25.6 pg — ABNORMAL LOW (ref 26.0–34.0)
MCHC: 32.4 g/dL (ref 30.0–36.0)
MCV: 78.9 fL — ABNORMAL LOW (ref 80.0–100.0)
Monocytes Absolute: 0.7 10*3/uL (ref 0.1–1.0)
Monocytes Relative: 5 %
Neutro Abs: 11.2 10*3/uL — ABNORMAL HIGH (ref 1.7–7.7)
Neutrophils Relative %: 79 %
Platelets: 296 10*3/uL (ref 150–400)
RBC: 5.7 MIL/uL (ref 4.22–5.81)
RDW: 14.5 % (ref 11.5–15.5)
WBC: 14.1 10*3/uL — ABNORMAL HIGH (ref 4.0–10.5)
nRBC: 1 % — ABNORMAL HIGH (ref 0.0–0.2)

## 2019-11-09 LAB — COMPREHENSIVE METABOLIC PANEL
ALT: 72 U/L — ABNORMAL HIGH (ref 0–44)
AST: 128 U/L — ABNORMAL HIGH (ref 15–41)
Albumin: 3.2 g/dL — ABNORMAL LOW (ref 3.5–5.0)
Alkaline Phosphatase: 81 U/L (ref 38–126)
Anion gap: 19 — ABNORMAL HIGH (ref 5–15)
BUN: 30 mg/dL — ABNORMAL HIGH (ref 6–20)
CO2: 18 mmol/L — ABNORMAL LOW (ref 22–32)
Calcium: 8.2 mg/dL — ABNORMAL LOW (ref 8.9–10.3)
Chloride: 102 mmol/L (ref 98–111)
Creatinine, Ser: 1.61 mg/dL — ABNORMAL HIGH (ref 0.61–1.24)
GFR calc Af Amer: 56 mL/min — ABNORMAL LOW (ref >60)
GFR calc non Af Amer: 48 mL/min — ABNORMAL LOW (ref >60)
Glucose, Bld: 195 mg/dL — ABNORMAL HIGH (ref 70–99)
Potassium: 4.1 mmol/L (ref 3.5–5.1)
Sodium: 139 mmol/L (ref 135–145)
Total Bilirubin: 0.8 mg/dL (ref 0.3–1.2)
Total Protein: 8.5 g/dL — ABNORMAL HIGH (ref 6.5–8.1)

## 2019-11-09 LAB — BLOOD GAS, VENOUS
Acid-base deficit: 4.1 mmol/L — ABNORMAL HIGH (ref 0.0–2.0)
Bicarbonate: 16.8 mmol/L — ABNORMAL LOW (ref 20.0–28.0)
FIO2: 100
O2 Saturation: 95.5 %
Patient temperature: 37
pCO2, Ven: 21 mmHg — ABNORMAL LOW (ref 44.0–60.0)
pH, Ven: 7.51 — ABNORMAL HIGH (ref 7.250–7.430)
pO2, Ven: 70 mmHg — ABNORMAL HIGH (ref 32.0–45.0)

## 2019-11-09 LAB — LACTIC ACID, PLASMA: Lactic Acid, Venous: 5.8 mmol/L (ref 0.5–1.9)

## 2019-11-09 LAB — FIBRINOGEN: Fibrinogen: 697 mg/dL — ABNORMAL HIGH (ref 210–475)

## 2019-11-09 LAB — TRIGLYCERIDES: Triglycerides: 164 mg/dL — ABNORMAL HIGH (ref <150)

## 2019-11-09 LAB — LACTATE DEHYDROGENASE: LDH: 1089 U/L — ABNORMAL HIGH (ref 98–192)

## 2019-11-09 LAB — FIBRIN DERIVATIVES D-DIMER (ARMC ONLY): Fibrin derivatives D-dimer (ARMC): >7500 ng/mL (FEU) — ABNORMAL HIGH (ref 0.00–499.00)

## 2019-11-09 LAB — FERRITIN: Ferritin: 879 ng/mL — ABNORMAL HIGH (ref 24–336)

## 2019-11-09 MED ORDER — SODIUM CHLORIDE 0.9 % IV SOLN
100.0000 mg | Freq: Every day | INTRAVENOUS | Status: DC
  Administered 2019-11-11: 100 mg via INTRAVENOUS
  Filled 2019-11-09: qty 100

## 2019-11-09 MED ORDER — GUAIFENESIN-DM 100-10 MG/5ML PO SYRP
10.0000 mL | ORAL_SOLUTION | ORAL | Status: DC | PRN
  Filled 2019-11-09: qty 10

## 2019-11-09 MED ORDER — ZINC SULFATE 220 (50 ZN) MG PO CAPS
220.0000 mg | ORAL_CAPSULE | Freq: Every day | ORAL | Status: DC
  Administered 2019-11-10 – 2019-11-11 (×2): 220 mg via ORAL
  Filled 2019-11-09 (×2): qty 1

## 2019-11-09 MED ORDER — METHYLPREDNISOLONE SODIUM SUCC 125 MG IJ SOLR
0.5000 mg/kg | Freq: Two times a day (BID) | INTRAMUSCULAR | Status: DC
  Administered 2019-11-10: 17:00:00 53.125 mg via INTRAVENOUS
  Filled 2019-11-09 (×2): qty 2

## 2019-11-09 MED ORDER — ASCORBIC ACID 500 MG PO TABS
500.0000 mg | ORAL_TABLET | Freq: Every day | ORAL | Status: DC
  Administered 2019-11-10 – 2019-11-11 (×2): 500 mg via ORAL
  Filled 2019-11-09 (×2): qty 1

## 2019-11-09 MED ORDER — SODIUM CHLORIDE 0.9 % IV SOLN
200.0000 mg | Freq: Once | INTRAVENOUS | Status: AC
  Administered 2019-11-10: 200 mg via INTRAVENOUS
  Filled 2019-11-09: qty 200

## 2019-11-09 MED ORDER — DEXAMETHASONE SODIUM PHOSPHATE 10 MG/ML IJ SOLN
6.0000 mg | Freq: Once | INTRAMUSCULAR | Status: AC
  Administered 2019-11-10: 6 mg via INTRAVENOUS
  Filled 2019-11-09: qty 1

## 2019-11-09 NOTE — ED Notes (Signed)
Pt on placed on heated high flow [place]. Pt sat at 90% at this time. RR 33. EDP goodman at this time.

## 2019-11-09 NOTE — ED Notes (Addendum)
This RN spoke with Shadrach Bartunek (spouse) to provide pt's status update.

## 2019-11-09 NOTE — ED Provider Notes (Signed)
[facility] Emergency Department Provider Note  ____________________________________________   I have reviewed the triage vital signs and the nursing notes.   HISTORY  Chief Complaint Shortness of Breath   History limited by: Respiratory distress.    HPI Nathaniel Webb is a 53 y.o. male who presents to the emergency department today because of concern for shortness of breath. Has been going on for roughly 1 week. Was seen in the emergency department 4 days ago and tested positive for covid. Shortness of breath apparently continued to get worse. Patient unable to give significant history secondary to respiratory distress   Records reviewed. Per medical record review patient has a history of recent ER visit with positive COVID test.   Past Medical History:  Diagnosis Date  . Diabetes mellitus without complication ([place])   . Heel pain 06/09/2015  . High triglycerides 05/09/2016  . Hypertension   . Low HDL (under 40) 05/09/2016  . Obesity, Class II, BMI 35-39.9, with comorbidity ([place]) 01/04/2016    Patient Active Problem List   Diagnosis Date Noted  . Health care maintenance 07/29/2019  . Metatarsalgia of left foot 06/18/2019  . Prediabetes 05/08/2019  . Peripheral neuropathy 05/08/2019  . Osteoarthritis of both knees 05/08/2019  . Osteoarthritis of right knee 02/15/2017  . Hand cramps 11/09/2016  . Tinea pedis 06/04/2016  . Low HDL (under 40) 05/09/2016  . High triglycerides 05/09/2016  . Medication monitoring encounter 05/03/2016  . Controlled type 2 diabetes mellitus without complication, without long-term current use of insulin ([place]) 01/04/2016  . Needs flu shot 01/04/2016  . Morbid obesity ([place]) 01/04/2016  . Screening for STD (sexually transmitted disease) 01/04/2016  . Encounter for cholesteral screening for cardiovascular disease 01/04/2016  . AD (atopic dermatitis) 06/09/2015  . Chronic low back pain 06/09/2015  . Hypertension 06/09/2015  .  Type 2 diabetes, controlled, with peripheral neuropathy ([place]) 12/23/2014    No past surgical history on file.  Prior to Admission medications   Medication Sig Start Date End Date Taking? Authorizing Provider  Acetaminophen 500 MG coapsule Take by mouth.    [provider]  atorvastatin (LIPITOR) 10 MG tablet Take 1 tablet (10 mg total) by mouth at bedtime. 07/01/19   Iloabachie, Chioma E, NP  BAYER ASPIRIN EC LOW DOSE 81 MG EC tablet TAKE ONE TABLET BY MOUTH EVERY DAY 06/10/19   Iloabachie, Chioma E, NP  Blood Gluc Meter Disp-Strips (BLOOD GLUCOSE METER DISPOSABLE) DEVI For Contour Check FSBS once a day on average, dx E11.9, LON 99 months 02/15/17   Lada, Satira Anis, MD  Blood Glucose Monitoring Suppl (CONTOUR BLOOD GLUCOSE SYSTEM) DEVI Check FSBS once a day on average, dx E11.9, LON 99 months 02/15/17   Lada, Satira Anis, MD  fluticasone (FLONASE) 50 MCG/ACT nasal spray Place 2 sprays into both nostrils daily. 08/15/18 08/15/19  Tukov-Yual, Arlyss Gandy, NP  gabapentin (NEURONTIN) 100 MG capsule Take 1 capsule (100 mg total) by mouth 2 (two) times daily. 06/18/19   Iloabachie, Chioma E, NP  Lancets (ACCU-CHEK SOFT TOUCH) lancets Check FSBS once a day on average, dx E11.9, LON 99 months 02/15/17   Lada, Satira Anis, MD  lisinopril (ZESTRIL) 20 MG tablet Take 1 tablet (20 mg total) by mouth daily. 07/01/19   Iloabachie, Chioma E, NP  metFORMIN (GLUCOPHAGE) 500 MG tablet Take 1 tablet (500 mg total) by mouth 2 (two) times daily with a meal. (best if taking 15-20 minutes before biggest meal of the day) 06/30/19  Iloabachie, Chioma E, NP  omega-3 acid ethyl esters (LOVAZA) 1 g capsule Take 2 capsules (2 g total) by mouth 2 (two) times daily. (this replaces your fish oil) 08/15/18   Tukov-Yual, Alroy Bailiff, NP    Allergies Patient has no known allergies.  Family History  Problem Relation Age of Onset  . Diabetes Father   . COPD Mother     Social History Social History   Tobacco Use  . Smoking  status: Former Games developer  . Smokeless tobacco: Never Used  Substance Use Topics  . Alcohol use: No    Alcohol/week: 0.0 standard drinks  . Drug use: No    Review of Systems limited secondary to respiratory distress.  Constitutional: Positive for fevers Cardiovascular: Denies chest pain. Respiratory: Positive for shortness of breath. ____________________________________________   PHYSICAL EXAM:  VITAL SIGNS: ED Triage Vitals [11/09/19 2155]  Enc Vitals Group     BP (!) 162/80     Pulse Rate (!) 120     Resp (!) 39     Temp      Temp src      SpO2 (!) 46 %   Constitutional: Alert and oriented.  Eyes: Conjunctivae are normal.  ENT      Head: Normocephalic and atraumatic.      Nose: No congestion/rhinnorhea.      Mouth/Throat: Mucous membranes are moist.      Neck: No stridor. Hematological/Lymphatic/Immunilogical: No cervical lymphadenopathy. Cardiovascular: Tachycardic Respiratory: Tachypnea, increased respiratory effort. Diffuse rhonchi.  Gastrointestinal: Soft and non tender. No rebound. No guarding.  Genitourinary: Deferred Musculoskeletal: Normal range of motion in all extremities. No lower extremity edema. Neurologic:  Normal speech and language. No gross focal neurologic deficits are appreciated.  Skin:  Skin is warm, dry and intact. No rash noted. ____________________________________________    LABS (pertinent positives/negatives)  CBC wbc 14.1, hgb 14.6, plt 296  ____________________________________________   EKG  I, Phineas Semen, attending physician, personally viewed and interpreted this EKG  EKG Time: 2202 Rate: 115 Rhythm: sinus tachycardia Axis: normal Intervals: qtc 460 QRS: narrow, q waves v1 ST changes: no st elevation Impression: abnormal ekg  ____________________________________________    RADIOLOGY  CXR Multifocal opacities.   ____________________________________________   PROCEDURES  Procedures  CRITICAL CARE Performed  by: Phineas Semen   Total critical care time: 35 minutes  Critical care time was exclusive of separately billable procedures and treating other patients.  Critical care was necessary to treat or prevent imminent or life-threatening deterioration.  Critical care was time spent personally by me on the following activities: development of treatment plan with patient and/or surrogate as well as nursing, discussions with consultants, evaluation of patient's response to treatment, examination of patient, obtaining history from patient or surrogate, ordering and performing treatments and interventions, ordering and review of laboratory studies, ordering and review of radiographic studies, pulse oximetry and re-evaluation of patient's condition.  ____________________________________________   INITIAL IMPRESSION / ASSESSMENT AND PLAN / ED COURSE  Pertinent labs & imaging results that were available during my care of the patient were reviewed by me and considered in my medical decision making (see chart for details).   Patient presented to the emergency department today because of concerns for shortness of breath.  Patient was found to be profoundly hypoxic by EMS.  They did put patient on nonrebreather.  Upon arrival patient continued to have sats in the 50s and 60s on nonrebreather.  He was significantly tachycardic.  He was switched to high flow nasal cannula  and did have significant improvement in his oxygen saturation as well as [place] clinical picture.  While he remained tachycardic he appeared more comfortable than he had ever initially.  Patient is a known Covid positive patient.   Will plan on admission.  ____________________________________________   FINAL CLINICAL IMPRESSION(S) / ED DIAGNOSES  Final diagnoses:  COVID-19  Hypoxia     Note: This dictation was prepared with Dragon dictation. Any transcriptional errors that result from this process are unintentional     Phineas SemenGoodman,  Li Bobo, MD 11/09/19 [phone]

## 2019-11-09 NOTE — ED Notes (Signed)
COVID + on 12/23

## 2019-11-09 NOTE — H&P (Signed)
Nathaniel Webb XTG:[id number] DOB: [birth year] DOA: 11/09/2019     PCP: Langston Reusing, NP   Outpatient Specialists: NONE    Patient arrived to ER on 11/09/19 at 2148  Patient coming from: home Lives   With family   Chief Complaint:  Chief Complaint  Patient presents with  . Shortness of Breath    HPI: Nathaniel Webb is a 53 y.o. male with medical history significant of COVID DM2, HLD, Obesity, tobacco abuse    Presented with cough intermittent fever generalized weakness for 1 week has been trying to take Tylenol for fever Was seen in the emergency department on 23 December and diagnosed with Covid infection. Patient was short of breath and EMS was called on arrival satting 50% Placed on nonrebreather 15 L Placed on heated high flow nasal cannula saturation 90% this time  Initially noted to be in respiratory distress Infectious risk factors:  Reports  fever, shortness of breath, dry cough,    KNOWN COVID POSITIVE     No results found for: SARSCOV2NAA   Regarding pertinent Chronic problems:     Hyperlipidemia -  on statins lipitor   HTN on Lisinopril   DM 2 -  Lab Results  Component Value Date   HGBA1C 6.4 (H) 06/11/2019    PO meds only     Morbid obesity-   BMI Readings from Last 1 Encounters:  11/09/19 38.94 kg/m      While in ER:   Chest x-ray showing multifocal opacities On [place] 15L  The following Work up has been ordered so far:  Orders Placed This Encounter  Procedures  . Blood Culture (routine x 2)  . DG Chest Port 1 View  . Blood gas, venous  . Lactic acid, plasma  . CBC WITH DIFFERENTIAL  . Comprehensive metabolic panel  . Fibrin derivatives D-Dimer  . Procalcitonin  . Lactate dehydrogenase  . Ferritin  . Triglycerides  . Fibrinogen  . C-reactive protein  . Diet NPO time specified  . If O2 Sat <94% administer O2 at 2 liters/minute via nasal cannula  . Cardiac monitoring  . Insert peripheral IV x 2  . Initiate  Carrier Fluid Protocol  . Place surgical mask on patient  . Patient to wear surgical mask during transportation  . RN/NT - Document specific oxygen requirements in CHL  . Notify EDP if new oxygen requirements escalates > 4L per minute [place]  . RN to draw the following extra tubes:  . Consult to hospitalist  ALL PATIENTS BEING ADMITTED/HAVING PROCEDURES NEED COVID-19 SCREENING  . Airborne and Contact precautions  . Pulse oximetry, continuous  . Pulse oximetry, continuous  . Oxygen therapy Mode or (Route): High flow nasal cannula; Humidity: Yes; Heated: Yes  . ED EKG  . ED EKG 12-Lead     Following Medications were ordered in ER: Medications  dexamethasone (DECADRON) injection 6 mg (has no administration in time range)        Consult Orders  (From admission, onward)         Start     Ordered   11/09/19 2318  Consult to hospitalist  ALL PATIENTS BEING ADMITTED/HAVING PROCEDURES NEED COVID-19 SCREENING  Once    Comments: ALL PATIENTS BEING ADMITTED/HAVING PROCEDURES NEED COVID-19 SCREENING  Provider:  (Not yet assigned)  Question Answer Comment  Place call to: hospitalist   Reason for Consult Admit   Diagnosis/Clinical Info for Consult: covid/hypoxia      11/09/19 2317  Significant initial  Findings: Abnormal Labs Reviewed  BLOOD GAS, VENOUS - Abnormal; Notable for the following components:      Result Value   pH, Ven 7.51 (*)    pCO2, Ven 21 (*)    pO2, Ven 70.0 (*)    Bicarbonate 16.8 (*)    Acid-base deficit 4.1 (*)    All other components within normal limits  CBC WITH DIFFERENTIAL/PLATELET - Abnormal; Notable for the following components:   WBC 14.1 (*)    MCV 78.9 (*)    MCH 25.6 (*)    nRBC 1.0 (*)    All other components within normal limits    Otherwise labs showing:    Recent Labs  Lab 11/05/19 1217  NA 134*  K 3.8  CO2 24  GLUCOSE 124*  BUN 17  CREATININE 1.08  CALCIUM 8.5*    Cr    Stable,  Lab Results  Component Value Date    CREATININE 1.08 11/05/2019   CREATININE 0.87 06/11/2019   CREATININE 0.97 02/12/2019    No results for input(s): AST, ALT, ALKPHOS, BILITOT, PROT, ALBUMIN in the last 168 hours. Lab Results  Component Value Date   CALCIUM 8.5 (L) 11/05/2019   PHOS 3.4 08/15/2018      WBC      Component Value Date/Time   WBC 14.1 (H) 11/09/2019 2154   ANC    Component Value Date/Time   NEUTROABS PENDING 11/09/2019 2154   NEUTROABS 4.8 06/11/2019 1117   ALC No components found for: LYMPHAB   Plt: Lab Results  Component Value Date   PLT 296 11/09/2019    Lactic Acid, Venous    Component Value Date/Time   LATICACIDVEN 5.8 (HH) 11/09/2019 2154    Procalcitonin 0.88   COVID-19 Labs  Recent Labs    11/09/19 2154  FERRITIN 879*  LDH 1,089*    No results found for: SARSCOV2NAA   Arterial    Blood Gas result:  pH 7.51 pCO2 21 pO2 70      ABG    Component Value Date/Time   HCO3 16.8 (L) 11/09/2019 2210   ACIDBASEDEF 4.1 (H) 11/09/2019 2210   O2SAT 95.5 11/09/2019 2210    HG/HCT  stable,      Component Value Date/Time   HGB 14.6 11/09/2019 2154   HGB 13.5 06/11/2019 1117   HCT 45.0 11/09/2019 2154   HCT 39.1 06/11/2019 1117     Troponin   ordered Cardiac Panel (last 3 results) No results for input(s): CKTOTAL, CKMB, TROPONINI, RELINDX in the last 72 hours.     ECG: Ordered Personally reviewed by me showing: HR : 115 Rhythm:   Sinus tachycardia   no evidence of ischemic changes QTC 460    DM  labs:  HbA1C: Recent Labs    02/12/19 1025 06/11/19 1117  HGBA1C 6.3* 6.4*         UA  not ordered   Urine analysis:    Component Value Date/Time   COLORURINE YELLOW (A) 07/08/2018 1113   APPEARANCEUR CLEAR (A) 07/08/2018 1113   LABSPEC 1.013 07/08/2018 1113   PHURINE 5.0 07/08/2018 1113   GLUCOSEU NEGATIVE 07/08/2018 1113   HGBUR NEGATIVE 07/08/2018 1113   BILIRUBINUR NEGATIVE 07/08/2018 1113   KETONESUR NEGATIVE 07/08/2018 1113   PROTEINUR NEGATIVE  07/08/2018 1113   NITRITE NEGATIVE 07/08/2018 1113   LEUKOCYTESUR NEGATIVE 07/08/2018 1113      Ordered   CXR - bilateral infiltrates   ED Triage Vitals  Enc Vitals Group  BP 11/09/19 2155 (!) 162/80     Pulse Rate 11/09/19 2155 (!) 120     Resp 11/09/19 2155 (!) 39     Temp --      Temp src --      SpO2 11/09/19 2155 (!) 46 %     Weight 11/09/19 2309 234 lb (106.1 kg)     Height 11/09/19 2309 5\' 5"  (1.651 m)     Head Circumference --      Peak Flow --      Pain Score --      Pain Loc --      Pain Edu? --      Excl. in GC? --   TMAX(24)@       Latest  Blood pressure 115/81, pulse (!) 107, resp. rate (!) 53, height 5\' 5"  (1.651 m), weight 106.1 kg, SpO2 96 %.     Hospitalist was called for admission for COVID pneumonia    Review of Systems:    Pertinent positives include: Fevers, chills, fatigue,  shortness of breath at rest.dyspnea on exertion, Constitutional:  No weight loss, night sweats,   weight loss  HEENT:  No headaches, Difficulty swallowing,Tooth/dental problems,Sore throat,  No sneezing, itching, ear ache, nasal congestion, post nasal drip,  Cardio-vascular:  No chest pain, Orthopnea, PND, anasarca, dizziness, palpitations.no Bilateral lower extremity swelling  GI:  No heartburn, indigestion, abdominal pain, nausea, vomiting, diarrhea, change in bowel habits, loss of appetite, melena, blood in stool, hematemesis Resp:     No excess mucus, no productive cough, No non-productive cough, No coughing up of blood.No change in color of mucus.No wheezing. Skin:  no rash or lesions. No jaundice GU:  no dysuria, change in color of urine, no urgency or frequency. No straining to urinate.  No flank pain.  Musculoskeletal:  No joint pain or no joint swelling. No decreased range of motion. No back pain.  Psych:  No change in mood or affect. No depression or anxiety. No memory loss.  Neuro: no localizing neurological complaints, no tingling, no weakness, no  double vision, no gait abnormality, no slurred speech, no confusion  All systems reviewed and apart from HOPI all are negative  Past Medical History:   Past Medical History:  Diagnosis Date  . Diabetes mellitus without complication (HCC)   . Heel pain 06/09/2015  . High triglycerides 05/09/2016  . Hypertension   . Low HDL (under 40) 05/09/2016  . Obesity, Class II, BMI 35-39.9, with comorbidity (HCC) 01/04/2016      No past surgical history on file.  Social History:  Ambulatory   Independently      reports that he has quit smoking. He has never used smokeless tobacco. He reports that he does not drink alcohol or use drugs.   Family History:   Family History  Problem Relation Age of Onset  . Diabetes Father   . COPD Mother     Allergies: No Known Allergies   Prior to Admission medications   Medication Sig Start Date End Date Taking? Authorizing Provider  Acetaminophen 500 MG coapsule Take by mouth.    [provider]  atorvastatin (LIPITOR) 10 MG tablet Take 1 tablet (10 mg total) by mouth at bedtime. 07/01/19   Iloabachie, Chioma E, NP  BAYER ASPIRIN EC LOW DOSE 81 MG EC tablet TAKE ONE TABLET BY MOUTH EVERY DAY 06/10/19   Iloabachie, Chioma E, NP  Blood Gluc Meter Disp-Strips (BLOOD GLUCOSE METER DISPOSABLE) DEVI For Contour Check FSBS once a  day on average, dx E11.9, LON 99 months 02/15/17   Lada, Janit Bern, MD  Blood Glucose Monitoring Suppl (CONTOUR BLOOD GLUCOSE SYSTEM) DEVI Check FSBS once a day on average, dx E11.9, LON 99 months 02/15/17   Lada, Janit Bern, MD  fluticasone (FLONASE) 50 MCG/ACT nasal spray Place 2 sprays into both nostrils daily. 08/15/18 08/15/19  Tukov-Yual, Alroy Bailiff, NP  gabapentin (NEURONTIN) 100 MG capsule Take 1 capsule (100 mg total) by mouth 2 (two) times daily. 06/18/19   Iloabachie, Chioma E, NP  Lancets (ACCU-CHEK SOFT TOUCH) lancets Check FSBS once a day on average, dx E11.9, LON 99 months 02/15/17   Lada, Janit Bern, MD  lisinopril  (ZESTRIL) 20 MG tablet Take 1 tablet (20 mg total) by mouth daily. 07/01/19   Iloabachie, Chioma E, NP  metFORMIN (GLUCOPHAGE) 500 MG tablet Take 1 tablet (500 mg total) by mouth 2 (two) times daily with a meal. (best if taking 15-20 minutes before biggest meal of the day) 06/30/19   Iloabachie, Chioma E, NP  omega-3 acid ethyl esters (LOVAZA) 1 g capsule Take 2 capsules (2 g total) by mouth 2 (two) times daily. (this replaces your fish oil) 08/15/18   Tukov-Yual, Alroy Bailiff, NP   Physical Exam: Blood pressure 115/81, pulse (!) 107, resp. rate (!) 53, height  (1.651 m), weight 106.1 kg, SpO2 96 %. 1. General:  in  Acute distress increased work of breathing     acutely ill -appearing 2. Psychological: Alert and   Oriented 3. Head/ENT:   Moist   Mucous Membranes                          Head Non traumatic, neck supple                           Poor Dentition 4. SKIN:   decreased Skin turgor,  Skin clean Dry and intact no rash 5. Heart: Regular rate and rhythm no Murmur, no Rub or gallop 6. Lungs:   no wheezes or crackles   7. Abdomen: Soft,  non-tender, Non distended   Obese bowel sounds present 8. Lower extremities: no clubbing, cyanosis, no  edema 9. Neurologically Grossly intact, moving all 4 extremities equally   10. MSK: Normal range of motion   All other LABS:     Recent Labs  Lab 11/05/19 1217 11/09/19 2154  WBC 5.4 14.1*  NEUTROABS  --  PENDING  HGB 13.3 14.6  HCT 40.6 45.0  MCV 79.5* 78.9*  PLT 161 296     Recent Labs  Lab 11/05/19 1217  NA 134*  K 3.8  CL 98  CO2 24  GLUCOSE 124*  BUN 17  CREATININE 1.08  CALCIUM 8.5*     No results for input(s): AST, ALT, ALKPHOS, BILITOT, PROT, ALBUMIN in the last 168 hours.     Cultures: No results found for: SDES, SPECREQUEST, CULT, REPTSTATUS   Radiological Exams on Admission: DG Chest Port 1 View  Result Date: 11/09/2019 CLINICAL DATA:  Shortness of breath, difficulty breathing for 1 week, chills, nausea  vomiting and dry cough, COVID-19 positive EXAM: PORTABLE CHEST 1 VIEW COMPARISON:  Radiograph 11/05/2019 FINDINGS: Markedly worsening bilateral is airspace opacities with indistinct pulmonary vascularity and trace bilateral effusions. Prominence of the cardiomediastinal silhouette, possibly accentuated by the portable technique. No acute osseous or soft tissue abnormality. Degenerative changes are present in the imaged spine and shoulders. IMPRESSION:  Multifocal interstitial and airspace opacities throughout the lungs most compatible with infectious pneumonia/pneumonitis in the setting of COVID-19 positivity. Some underlying edema may be present as well. Electronically Signed   By: Kreg ShropshirePrice  DeHay M.D.   On: 11/09/2019 22:38    Chart has been reviewed    Assessment/Plan   53 y.o. male with medical history significant of COVID DM2, HLD, Obesity, tobacco abuse Admitted for COVID PNA  Present on Admission: . Acute respiratory failure (HCC) due to . Pneumonia due to COVID-19 virus   FROM HOME   WITH KNOWN HX OF COVID19 Initial positive on 11/05/2019    Following concerning LAB/ imaging findings:  CBC: leukopenia, lymphopenia    BMP: increased BUN/Cr   LFTs: increased AST/ALT/Tbili   CRP, LDH: increased   IL-6 and Ferritin increased   Procalcitonin: low   CXR: hazy bilateral peripheral opacities      -Following work-up initiated:          Following complications noted:    evidence of AKI - will provide gentle rehydration  elevated LFT's likely in the setting of COVID continue to follow    Plan of treatment: - [facility]   [facility] pt meats criteria for transfer     -given severity of illness initiate steroids Decadron 6mg  q 24 hours And pharmacy consult for remdesivir - IF Hypoxia and CRP >7 will attempt a trial of Actemra - Will follow daily d.dimer - Assess for ability to prone  - Supportive management -Fluid sparing resuscitation  -Provide  oxygen as needed currently on   SpO2: 99 % O2 Flow Rate (L/min): 60 L/min( and nonrebreather mask) FiO2 (%): 100 % - IF d.dimer elvated >5 will increase dose of lovenox    - Consult PCCM if becomes respiratory unstable   Poor Prognostic factors  53 y.o.  Personal hx of DM2  HTN, obesity  Evidence of  organ damage  Present , AKI, respiratory failure requiring >4L [place]  tachypnea, tachycardia present on admission    Will order Airborne and Contact precautions  Family/ patient prognosis discussion: I have discussed case with the family/ patient  who are aware of their prognosis At this point  (patient)  would like to be full code  care      . Type 2 diabetes, controlled, with peripheral neuropathy (HCC) -  - Order Sensitive  SSI     -  check TSH and HgA1C  - Hold by mouth medications    . Hypertension - continue home meds  . Morbid obesity (HCC) -puts patient at risk risk of severe Covid   Other plan as per orders.  DVT prophylaxis:  Lovenox     Code Status:  FULL CODE  as per patient  I had personally discussed CODE STATUS with patient    Family Communication:   Family not at  Bedside    Disposition Plan:      To home once workup is complete and patient is stable                                   Consults called: none    Admission status:  ED Disposition    None        inpatient     Expect 2 midnight stay secondary to severity of patient's current illness including   hemodynamic instability despite optimal treatment (tachycardia  hypoxia)  Severe lab/radiological/exam abnormalities including:   and extensive comorbidities including:  DM2  Morbid Obesity   That are currently affecting medical management.   I expect  patient to be hospitalized for 2 midnights requiring inpatient medical care.  Patient is at high risk for adverse outcome (such as loss of life or disability) if not treated.  Indication for inpatient stay as follows:    Hemodynamic  instability despite maximal medical therapy,      New or worsening hypoxia  Need for IV steroids and antivirals    Level of care       SDU tele indefinitely please discontinue once patient no longer qualifies   Precautions: admitted as  covid positive Airborne and Contact precautions    PPE: Used by the provider:   P100  eye Goggles,  Gloves  gown   Leeandre Nordling 11/09/2019, 2:10 AM    [facility]     after 2 AM please page floor coverage PA If 7AM-7PM, please contact the day team taking care of the patient using [email or web address]

## 2019-11-09 NOTE — ED Triage Notes (Addendum)
Pt from home via [facility]. Per EMS, pt difficulty breathing for 1 week, chills. N/V, dry cough. Smoker for 13 yrs. At home pt sating at 25's per EMS. pt arrives on NRB at 15L . EMS unable to obtain saturation level.  MD goodman at bedside.

## 2019-11-10 ENCOUNTER — Inpatient Hospital Stay (HOSPITAL_COMMUNITY)
Admission: AD | Admit: 2019-11-10 | Payer: Medicaid Other | Source: Other Acute Inpatient Hospital | Admitting: Internal Medicine

## 2019-11-10 DIAGNOSIS — U071 COVID-19: Secondary | ICD-10-CM

## 2019-11-10 DIAGNOSIS — J1282 Pneumonia due to coronavirus disease 2019: Secondary | ICD-10-CM | POA: Diagnosis present

## 2019-11-10 HISTORY — DX: COVID-19: U07.1

## 2019-11-10 LAB — TROPONIN I (HIGH SENSITIVITY)
Troponin I (High Sensitivity): 439 ng/L (ref <18)
Troponin I (High Sensitivity): 310 ng/L (ref <18)

## 2019-11-10 LAB — CBC WITH DIFFERENTIAL/PLATELET
Abs Immature Granulocytes: 0.16 10*3/uL — ABNORMAL HIGH (ref 0.00–0.07)
Basophils Absolute: 0 10*3/uL (ref 0.0–0.1)
Basophils Relative: 0 %
Eosinophils Absolute: 0 10*3/uL (ref 0.0–0.5)
Eosinophils Relative: 0 %
HCT: 40.8 % (ref 39.0–52.0)
Hemoglobin: 13.2 g/dL (ref 13.0–17.0)
Immature Granulocytes: 1 %
Lymphocytes Relative: 11 %
Lymphs Abs: 1.4 10*3/uL (ref 0.7–4.0)
MCH: 25.5 pg — ABNORMAL LOW (ref 26.0–34.0)
MCHC: 32.4 g/dL (ref 30.0–36.0)
MCV: 78.9 fL — ABNORMAL LOW (ref 80.0–100.0)
Monocytes Absolute: 0.6 10*3/uL (ref 0.1–1.0)
Monocytes Relative: 5 %
Neutro Abs: 10.2 10*3/uL — ABNORMAL HIGH (ref 1.7–7.7)
Neutrophils Relative %: 83 %
Platelets: 241 10*3/uL (ref 150–400)
RBC: 5.17 MIL/uL (ref 4.22–5.81)
RDW: 14.6 % (ref 11.5–15.5)
Smear Review: UNDETERMINED
WBC: 12.4 10*3/uL — ABNORMAL HIGH (ref 4.0–10.5)
nRBC: 0.4 % — ABNORMAL HIGH (ref 0.0–0.2)

## 2019-11-10 LAB — COMPREHENSIVE METABOLIC PANEL
ALT: 77 U/L — ABNORMAL HIGH (ref 0–44)
AST: 132 U/L — ABNORMAL HIGH (ref 15–41)
Albumin: 3 g/dL — ABNORMAL LOW (ref 3.5–5.0)
Alkaline Phosphatase: 78 U/L (ref 38–126)
Anion gap: 13 (ref 5–15)
BUN: 37 mg/dL — ABNORMAL HIGH (ref 6–20)
CO2: 22 mmol/L (ref 22–32)
Calcium: 7.8 mg/dL — ABNORMAL LOW (ref 8.9–10.3)
Chloride: 105 mmol/L (ref 98–111)
Creatinine, Ser: 1.37 mg/dL — ABNORMAL HIGH (ref 0.61–1.24)
GFR calc Af Amer: >60 mL/min (ref >60)
GFR calc non Af Amer: 59 mL/min — ABNORMAL LOW (ref >60)
Glucose, Bld: 158 mg/dL — ABNORMAL HIGH (ref 70–99)
Potassium: 4.6 mmol/L (ref 3.5–5.1)
Sodium: 140 mmol/L (ref 135–145)
Total Bilirubin: 0.8 mg/dL (ref 0.3–1.2)
Total Protein: 7.8 g/dL (ref 6.5–8.1)

## 2019-11-10 LAB — GLUCOSE, CAPILLARY
Glucose-Capillary: 147 mg/dL — ABNORMAL HIGH (ref 70–99)
Glucose-Capillary: 140 mg/dL — ABNORMAL HIGH (ref 70–99)
Glucose-Capillary: 136 mg/dL — ABNORMAL HIGH (ref 70–99)
Glucose-Capillary: 154 mg/dL — ABNORMAL HIGH (ref 70–99)

## 2019-11-10 LAB — HEMOGLOBIN A1C
Hgb A1c MFr Bld: 7.3 % — ABNORMAL HIGH (ref 4.8–5.6)
Mean Plasma Glucose: 162.81 mg/dL

## 2019-11-10 LAB — FERRITIN: Ferritin: 663 ng/mL — ABNORMAL HIGH (ref 24–336)

## 2019-11-10 LAB — C-REACTIVE PROTEIN
CRP: 21.8 mg/dL — ABNORMAL HIGH (ref <1.0)
CRP: 18.1 mg/dL — ABNORMAL HIGH (ref <1.0)

## 2019-11-10 LAB — MAGNESIUM: Magnesium: 3 mg/dL — ABNORMAL HIGH (ref 1.7–2.4)

## 2019-11-10 LAB — MRSA PCR SCREENING: MRSA by PCR: NEGATIVE

## 2019-11-10 LAB — PHOSPHORUS: Phosphorus: 4.3 mg/dL (ref 2.5–4.6)

## 2019-11-10 LAB — PROCALCITONIN: Procalcitonin: 0.88 ng/mL

## 2019-11-10 LAB — HIV ANTIBODY (ROUTINE TESTING W REFLEX): HIV Screen 4th Generation wRfx: NONREACTIVE

## 2019-11-10 LAB — FIBRIN DERIVATIVES D-DIMER (ARMC ONLY): Fibrin derivatives D-dimer (ARMC): >7500 ng/mL (FEU) — ABNORMAL HIGH (ref 0.00–499.00)

## 2019-11-10 MED ORDER — INSULIN ASPART 100 UNIT/ML ~~LOC~~ SOLN: 0.0000 [IU] | SUBCUTANEOUS | 11 refills | Status: DC

## 2019-11-10 MED ORDER — SODIUM CHLORIDE 0.9% FLUSH: 3.0000 mL | Freq: Two times a day (BID) | INTRAVENOUS | Status: DC

## 2019-11-10 MED ORDER — ACETAMINOPHEN 325 MG PO TABS: 650.0000 mg | ORAL_TABLET | Freq: Four times a day (QID) | ORAL | Status: DC | PRN

## 2019-11-10 MED ORDER — SODIUM CHLORIDE 0.9 % IV SOLN
500.0000 mg | INTRAVENOUS | Status: DC
  Administered 2019-11-10: 22:00:00 500 mg via INTRAVENOUS
  Filled 2019-11-10 (×2): qty 500

## 2019-11-10 MED ORDER — ENOXAPARIN SODIUM 60 MG/0.6ML ~~LOC~~ SOLN
0.5000 mg/kg | SUBCUTANEOUS | Status: DC
  Administered 2019-11-10: 55 mg via SUBCUTANEOUS
  Filled 2019-11-10 (×2): qty 0.6

## 2019-11-10 MED ORDER — ENOXAPARIN SODIUM 60 MG/0.6ML ~~LOC~~ SOLN
0.5000 mg/kg | Freq: Two times a day (BID) | SUBCUTANEOUS | Status: DC
  Filled 2019-11-10: qty 0.6

## 2019-11-10 MED ORDER — SODIUM CHLORIDE 0.9 % IV SOLN
1.0000 g | INTRAVENOUS | Status: DC
  Administered 2019-11-10: 21:00:00 1 g via INTRAVENOUS
  Filled 2019-11-10: qty 1
  Filled 2019-11-10: qty 10

## 2019-11-10 MED ORDER — ASCORBIC ACID 500 MG PO TABS: 500.0000 mg | ORAL_TABLET | Freq: Every day | ORAL | Status: DC

## 2019-11-10 MED ORDER — ONDANSETRON HCL 4 MG/2ML IJ SOLN: 4.0000 mg | Freq: Four times a day (QID) | INTRAMUSCULAR | 0 refills | Status: DC | PRN

## 2019-11-10 MED ORDER — ZINC SULFATE 220 (50 ZN) MG PO CAPS: 220.0000 mg | ORAL_CAPSULE | Freq: Every day | ORAL | Status: DC

## 2019-11-10 MED ORDER — HEPARIN (PORCINE) 25000 UT/250ML-% IV SOLN
1250.0000 [IU]/h | INTRAVENOUS | Status: DC
  Administered 2019-11-10: 18:00:00 1400 [IU]/h via INTRAVENOUS
  Administered 2019-11-11: 1250 [IU]/h via INTRAVENOUS
  Filled 2019-11-10 (×2): qty 250

## 2019-11-10 MED ORDER — SODIUM CHLORIDE 0.9% FLUSH
3.0000 mL | Freq: Two times a day (BID) | INTRAVENOUS | Status: DC
  Administered 2019-11-10 (×2): 3 mL via INTRAVENOUS

## 2019-11-10 MED ORDER — SODIUM CHLORIDE 0.9 % IV SOLN: 1.0000 g | INTRAVENOUS | Status: DC

## 2019-11-10 MED ORDER — SODIUM CHLORIDE 0.9% FLUSH: 3.0000 mL | INTRAVENOUS | Status: DC | PRN

## 2019-11-10 MED ORDER — HEPARIN BOLUS VIA INFUSION
2100.0000 [IU] | Freq: Once | INTRAVENOUS | Status: AC
  Administered 2019-11-10: 18:00:00 2100 [IU] via INTRAVENOUS
  Filled 2019-11-10: qty 2100

## 2019-11-10 MED ORDER — HYDROCODONE-ACETAMINOPHEN 5-325 MG PO TABS
1.0000 | ORAL_TABLET | ORAL | Status: DC | PRN
  Administered 2019-11-10: 1 via ORAL
  Filled 2019-11-10: qty 1

## 2019-11-10 MED ORDER — HEPARIN (PORCINE) 25000 UT/250ML-% IV SOLN: 1400.0000 [IU]/h | INTRAVENOUS | Status: DC

## 2019-11-10 MED ORDER — GUAIFENESIN-DM 100-10 MG/5ML PO SYRP: 10.0000 mL | ORAL_SOLUTION | ORAL | 0 refills | Status: DC | PRN

## 2019-11-10 MED ORDER — INSULIN ASPART 100 UNIT/ML ~~LOC~~ SOLN
0.0000 [IU] | SUBCUTANEOUS | Status: DC
  Administered 2019-11-10: 12:00:00 1 [IU] via SUBCUTANEOUS
  Administered 2019-11-10: 22:00:00 2 [IU] via SUBCUTANEOUS
  Administered 2019-11-10 (×2): 1 [IU] via SUBCUTANEOUS
  Administered 2019-11-11 (×2): 2 [IU] via SUBCUTANEOUS
  Filled 2019-11-10 (×7): qty 1

## 2019-11-10 MED ORDER — TOCILIZUMAB 400 MG/20ML IV SOLN
8.0000 mg/kg | Freq: Once | INTRAVENOUS | Status: AC
  Administered 2019-11-10: 08:00:00 848 mg via INTRAVENOUS
  Filled 2019-11-10: qty 40

## 2019-11-10 MED ORDER — ONDANSETRON HCL 4 MG PO TABS: 4.0000 mg | ORAL_TABLET | Freq: Four times a day (QID) | ORAL | Status: DC | PRN

## 2019-11-10 MED ORDER — ONDANSETRON HCL 4 MG/2ML IJ SOLN: 4.0000 mg | Freq: Four times a day (QID) | INTRAMUSCULAR | Status: DC | PRN

## 2019-11-10 MED ORDER — CHLORHEXIDINE GLUCONATE CLOTH 2 % EX PADS
6.0000 | MEDICATED_PAD | Freq: Every day | CUTANEOUS | Status: DC
  Filled 2019-11-10: qty 6

## 2019-11-10 MED ORDER — SODIUM CHLORIDE 0.9 % IV SOLN: 250.0000 mL | INTRAVENOUS | Status: DC | PRN

## 2019-11-10 MED ORDER — GUAIFENESIN-DM 100-10 MG/5ML PO SYRP: 10.0000 mL | ORAL_SOLUTION | ORAL | Status: DC | PRN

## 2019-11-10 MED ORDER — SODIUM CHLORIDE 0.9 % IV SOLN: 500.0000 mg | INTRAVENOUS | Status: DC

## 2019-11-10 MED ORDER — ONDANSETRON HCL 4 MG PO TABS: 4.0000 mg | ORAL_TABLET | Freq: Four times a day (QID) | ORAL | 0 refills | Status: DC | PRN

## 2019-11-10 MED ORDER — INSULIN ASPART 100 UNIT/ML ~~LOC~~ SOLN: 0.0000 [IU] | SUBCUTANEOUS | Status: DC

## 2019-11-10 NOTE — Progress Notes (Signed)
Remdesivir - Pharmacy Brief Note   O:  ALT: 72 CXR: evidence of infection SpO2: 99% on HFNC   A/P:  Remdesivir 200 mg IVPB once followed by 100 mg IVPB daily x 4 days.   Hart Robinsons, PharmD Clinical Pharmacist 11/10/2019  11/10/2019 12:19 AM

## 2019-11-10 NOTE — Progress Notes (Signed)
Dr. Lanney Gins notified that pt unable to tolerate VQ scan at this time. Will attempt scan in the morning.

## 2019-11-10 NOTE — Progress Notes (Signed)
[place] for Lovenox Indication: covid  No Known Allergies  Patient Measurements: Height: 5\' 5"  (165.1 cm) Weight: 234 lb (106.1 kg) IBW/kg (Calculated) : 61.5  Vital Signs: Temp: 98.8 F (37.1 C) (12/28 0900) Temp Source: Oral (12/28 0900) BP: 122/72 (12/28 0900) Pulse Rate: 85 (12/28 0900)  Labs: Recent Labs    11/09/19 2154 11/09/19 2307  HGB 14.6  --   HCT 45.0  --   PLT 296  --   CREATININE 1.61*  --   TROPONINIHS  --  439*   Estimated Creatinine Clearance: 60.2 mL/min (A) (by C-G formula based on SCr of 1.61 mg/dL (H)).  Medical History: Past Medical History:  Diagnosis Date  . Diabetes mellitus without complication ([place])   . Heel pain 06/09/2015  . High triglycerides 05/09/2016  . Hypertension   . Low HDL (under 40) 05/09/2016  . Obesity, Class II, BMI 35-39.9, with comorbidity ([place]) 01/04/2016   Medications:  No medications prior to admission.   Assessment: Nathaniel Webb is a 53 y.o. male with medical history significant of COVID DM2, HLD, Obesity, tobacco abuse.  Pharmacy asked to initiate and monitor Lovenox dosing.   Goal of Therapy:  Anti-Xa level 0.6-1 units/ml 4hrs after LMWH dose given Monitor platelets by anticoagulation protocol: Yes   Plan:  Based on current guidance for COVID-19 positive patients that are critically ill and/or D-dimer > 5000, will adjust Lovenox to 0.5 mg/kg BID. Will continue to monitor.  Tawnya Crook, PharmD 11/10/2019,9:19 AM

## 2019-11-10 NOTE — Consult Note (Signed)
CRITICAL CARE PROGRESS NOTE    Name: Nathaniel Webb MRN: [id number] DOB: May 31, 1966     LOS: 1   SUBJECTIVE FINDINGS & SIGNIFICANT EVENTS   Patient description:   53 year old with a history of morbid obesity, tobacco abuse, dyslipidemia diabetes who came in with few days of cough malaise fevers positive for COVID-19 found to be acutely hypoxemic and was brought up to the stepdown unit for acute hypoxemic respiratory failure.  Critical care consultation placed for increased oxygen requirement with severe acute hypoxemia and ARDS secondary to COVID-19  Lines / Drains: Peripheral IV x2  Cultures / Sepsis markers: Respiratory culture, nasal MRSA PCR, blood culture  Antibiotics: Remdesivir Actemra Azithromycin and Rocephin  Protocols / Consultants: VQ scan  Tests / Events: PE protocol unable to perform due to inability to lay in supine position    PAST MEDICAL HISTORY   Past Medical History:  Diagnosis Date  . Diabetes mellitus without complication ([place])   . Heel pain 06/09/2015  . High triglycerides 05/09/2016  . Hypertension   . Low HDL (under 40) 05/09/2016  . Obesity, Class II, BMI 35-39.9, with comorbidity ([place]) 01/04/2016     SURGICAL HISTORY   No past surgical history on file.   FAMILY HISTORY   Family History  Problem Relation Age of Onset  . Diabetes Father   . COPD Mother      SOCIAL HISTORY   Social History   Tobacco Use  . Smoking status: Former Research scientist (life sciences)  . Smokeless tobacco: Never Used  Substance Use Topics  . Alcohol use: No    Alcohol/week: 0.0 standard drinks  . Drug use: No     MEDICATIONS   Current Medication:  Current Facility-Administered Medications:  .  acetaminophen (TYLENOL) tablet 650 mg, 650 mg, Oral, Q6H PRN, Doutova, Anastassia, MD .  ascorbic  acid (VITAMIN C) tablet 500 mg, 500 mg, Oral, Daily, Doutova, Anastassia, MD, 500 mg at 11/10/19 0840 .  Chlorhexidine Gluconate Cloth 2 % PADS 6 each, 6 each, Topical, Q0600, Thornell Mule, MD .  guaiFENesin-dextromethorphan (ROBITUSSIN DM) 100-10 MG/5ML syrup 10 mL, 10 mL, Oral, Q4H PRN, Doutova, Anastassia, MD .  guaiFENesin-dextromethorphan (ROBITUSSIN DM) 100-10 MG/5ML syrup 10 mL, 10 mL, Oral, Q4H PRN, Doutova, Anastassia, MD .  heparin ADULT infusion 100 units/mL (25000 units/251m sodium chloride 0.45%), 1,400 Units/hr, Intravenous, Continuous, WRocky Morel RPH, Last Rate: 14 mL/hr at 11/10/19 1757, 1,400 Units/hr at 11/10/19 1757 .  HYDROcodone-acetaminophen (NORCO/VICODIN) 5-325 MG per tablet 1-2 tablet, 1-2 tablet, Oral, Q4H PRN, DToy Baker MD, 1 tablet at 11/10/19 0840 .  insulin aspart (novoLOG) injection 0-9 Units, 0-9 Units, Subcutaneous, Q4H, Doutova, Anastassia, MD, 1 Units at 11/10/19 1554 .  methylPREDNISolone sodium succinate (SOLU-MEDROL) 125 mg/2 mL injection 53.125 mg, 0.5 mg/kg, Intravenous, Q12H, Doutova, Anastassia, MD, 53.125 mg at 11/10/19 1728 .  ondansetron (ZOFRAN) tablet 4 mg, 4 mg, Oral, Q6H PRN **OR** ondansetron (ZOFRAN) injection 4 mg, 4 mg, Intravenous, Q6H PRN, DRoel Cluck Anastassia, MD .  [COMPLETED] remdesivir 200 mg in sodium chloride 0.9% 250 mL IVPB, 200 mg, Intravenous, Once, Stopped at 11/10/19 0228 **FOLLOWED BY** [START ON 11/11/2019] remdesivir 100 mg in sodium chloride 0.9 % 100 mL IVPB, 100 mg, Intravenous, Daily, Doutova, Anastassia, MD .  sodium chloride flush (NS) 0.9 % injection 3 mL, 3 mL, Intravenous, Q12H, Doutova, Anastassia, MD, 3 mL at 11/10/19 0840 .  zinc sulfate capsule 220 mg, 220 mg, Oral, Daily, Doutova, Anastassia, MD, 220 mg at 11/10/19 [phone]  ALLERGIES   Patient has no known allergies.    REVIEW OF SYSTEMS    Unable to obtain due to acute hypoxemic respiratory failure and acutely ill status  PHYSICAL  EXAMINATION   Vital Signs: Temp:  [98.5 F (36.9 C)-98.8 F (37.1 C)] 98.5 F (36.9 C) (12/28 1500) Pulse Rate:  [62-120] 69 (12/28 1800) Resp:  [20-53] 29 (12/28 1800) BP: (104-169)/(56-141) 111/77 (12/28 1800) SpO2:  [46 %-100 %] 93 % (12/28 1800) FiO2 (%):  [100 %] 100 % (12/28 0900) Weight:  [106.1 kg] 106.1 kg (12/27 2309)  COVID-19 DISASTER DECLARATION:   FULL CONTACT PHYSICAL EXAMINATION WAS NOT POSSIBLE DUE TO TREATMENT OF COVID-19  AND CONSERVATION OF PERSONAL PROTECTIVE EQUIPMENT, LIMITED EXAM FINDINGS INCLUDE-  Physical Exam  Constitutional: He appears well-developed and well-nourished. He appears distressed.  HENT:  Head: Normocephalic and atraumatic.  Eyes: Conjunctivae and EOM are normal.  Neck: No tracheal deviation present.  Cardiovascular: Normal rate.  Abdominal: He exhibits no distension and no mass.  Musculoskeletal:     Cervical back: Neck supple.  Neurological: He is alert.  Skin: Skin is intact. He is diaphoretic.      Patient assessed or the symptoms described in the history of present illness.  In the context of the Global COVID-19 pandemic, which necessitated consideration that the patient might be at risk for infection with the SARS-CoV-2 virus that causes COVID-19, Institutional protocols and algorithms that pertain to the evaluation of patients at risk for COVID-19 are in a state of rapid change based on information released by regulatory bodies including the CDC and federal and state organizations. These policies and algorithms were followed during the patient's care while in hospital.   PERTINENT DATA     Infusions: . heparin 1,400 Units/hr (11/10/19 1757)  . [START ON 11/11/2019] remdesivir 100 mg in NS 100 mL     Scheduled Medications: . vitamin C  500 mg Oral Daily  . Chlorhexidine Gluconate Cloth  6 each Topical Q0600  . insulin aspart  0-9 Units Subcutaneous Q4H  . methylPREDNISolone (SOLU-MEDROL) injection  0.5 mg/kg Intravenous  Q12H  . sodium chloride flush  3 mL Intravenous Q12H  . zinc sulfate  220 mg Oral Daily   PRN Medications: acetaminophen, guaiFENesin-dextromethorphan, guaiFENesin-dextromethorphan, HYDROcodone-acetaminophen, ondansetron **OR** ondansetron (ZOFRAN) IV Hemodynamic parameters:   Intake/Output: No intake/output data recorded.  Ventilator  Settings: FiO2 (%):  [100 %] 100 %     LAB RESULTS:  Basic Metabolic Panel: Recent Labs  Lab 11/05/19 1217 11/09/19 2154 11/10/19 0755  NA 134* 139 140  K 3.8 4.1 4.6  CL 98 102 105  CO2 24 18* 22  GLUCOSE 124* 195* 158*  BUN 17 30* 37*  CREATININE 1.08 1.61* 1.37*  CALCIUM 8.5* 8.2* 7.8*  MG  --   --  3.0*  PHOS  --   --  4.3   Liver Function Tests: Recent Labs  Lab 11/09/19 2154 11/10/19 0755  AST 128* 132*  ALT 72* 77*  ALKPHOS 81 78  BILITOT 0.8 0.8  PROT 8.5* 7.8  ALBUMIN 3.2* 3.0*   No results for input(s): LIPASE, AMYLASE in the last 168 hours. No results for input(s): AMMONIA in the last 168 hours. CBC: Recent Labs  Lab 11/05/19 1217 11/09/19 2154 11/10/19 0755  WBC 5.4 14.1* 12.4*  NEUTROABS  --  11.2* 10.2*  HGB 13.3 14.6 13.2  HCT 40.6 45.0 40.8  MCV 79.5* 78.9* 78.9*  PLT 161 296 241   Cardiac Enzymes: No results  for input(s): CKTOTAL, CKMB, CKMBINDEX, TROPONINI in the last 168 hours. BNP: Invalid input(s): POCBNP CBG: Recent Labs  Lab 11/10/19 0653 11/10/19 0815 11/10/19 1132  GLUCAP 147* 140* 136*     IMAGING RESULTS:  Imaging: DG Chest Port 1 View  Result Date: 11/09/2019 CLINICAL DATA:  Shortness of breath, difficulty breathing for 1 week, chills, nausea vomiting and dry cough, COVID-19 positive EXAM: PORTABLE CHEST 1 VIEW COMPARISON:  Radiograph 11/05/2019 FINDINGS: Markedly worsening bilateral is airspace opacities with indistinct pulmonary vascularity and trace bilateral effusions. Prominence of the cardiomediastinal silhouette, possibly accentuated by the portable technique. No acute  osseous or soft tissue abnormality. Degenerative changes are present in the imaged spine and shoulders. IMPRESSION: Multifocal interstitial and airspace opacities throughout the lungs most compatible with infectious pneumonia/pneumonitis in the setting of COVID-19 positivity. Some underlying edema may be present as well. Electronically Signed   By: Lovena Le M.D.   On: 11/09/2019 22:38      ASSESSMENT AND PLAN    -Multidisciplinary rounds held today  Acute Hypoxic Respiratory Failure  - due to COVID19 pneumonia -continue self proning - currently on NRB 15L -continue Bronchodilator Therapy -Wean Fio2 and PEEP as tolerated -will perform SAT/SBT when respiratory parameters are met    ID -continue IV abx as prescibed -follow up cultures  GI/Nutrition GI PROPHYLAXIS as indicated DIET-->TF's as tolerated Constipation protocol as indicated  ENDO - ICU hypoglycemic\Hyperglycemia protocol -check FSBS per protocol   ELECTROLYTES -follow labs as needed -replace as needed -pharmacy consultation   DVT/GI PRX ordered -SCDs  TRANSFUSIONS AS NEEDED MONITOR FSBS ASSESS the need for LABS as needed   Critical care provider statement:    Critical care time (minutes):  33   Critical care time was exclusive of:  Separately billable procedures and treating other patients   Critical care was necessary to treat or prevent imminent or life-threatening deterioration of the following conditions:  acute pneumonitis, covid 19 infection, multiple comorbid conditions.    Critical care was time spent personally by me on the following activities:  Development of treatment plan with patient or surrogate, discussions with consultants, evaluation of patient's response to treatment, examination of patient, obtaining history from patient or surrogate, ordering and performing treatments and interventions, ordering and review of laboratory studies and re-evaluation of patient's condition.  I assumed  direction of critical care for this patient from another provider in my specialty: no    This document was prepared using Dragon voice recognition software and may include unintentional dictation errors.    Ottie Glazier, M.D.  Division of [place]

## 2019-11-10 NOTE — ED Notes (Signed)
Patient to CCU with RN and CRT

## 2019-11-10 NOTE — ED Notes (Signed)
Patient called out to ask that lotion be put on his feet. Also needed to use the urinal. 500 mL out

## 2019-11-10 NOTE — Progress Notes (Signed)
Brief History:  A 53 y.o.malewith medical history significant of COVID DM2, HLD, Obesity, tobacco abuse presented withcough intermittent fever generalized weakness for 1 week has been trying to take Tylenol for fever as seen in the emergency department on 23 December and diagnosed with Covid infection. Patient was short of breath and EMS was called on arrival satting 50% Placed on nonrebreather 15 L.  Placed on heated high flow nasal cannula saturation 90% this time Initially noted to be in respiratory distress Infectious risk factors: Reports fever, shortness of breath, dry cough,  KNOWN COVID POSITIVE  Chest x-ray showing multifocal opacities On High Flow Excel 15L  Subjective: Appears to be tired and lethargic.  Was lying on bed on prone position.  Caring high flow oxygen.  Objective: Vital signs in last 24 hours: Temp:  [98.8 F (37.1 C)] 98.8 F (37.1 C) (12/28 0900) Pulse Rate:  [73-120] 73 (12/28 1200) Resp:  [20-53] 30 (12/28 1200) BP: (104-169)/(56-141) 117/64 (12/28 1200) SpO2:  [46 %-100 %] 95 % (12/28 1200) FiO2 (%):  [100 %] 100 % (12/28 0900) Weight:  [106.1 kg] 106.1 kg (12/27 2309)  Intake/Output from previous day: No intake/output data recorded. Intake/Output this shift: Total I/O In: 100 [IV Piggyback:100] Out: 200 [Urine:200]  1. General: Was lying on semiprone position.  Somewhat lethargic acutely ill -appearing 3. Head/ENT: Moist Mucous Membranes Head Non traumatic, neck supple Poor Dentition 4. SKIN: decreased Skin turgor, Skin clean Dry and intact no rash 5. Heart: Regular rate and rhythm no Murmur, no Rub or gallop 6. Lungs: Coarse 7. Abdomen: Soft, non-tender, Non distended Obese bowel sounds present 8. Lower extremities: no clubbing, cyanosis, no edema 9. Neurologically Grossly intact, moving all 4 extremities equally  10. MSK: Normal range of motion  Results for orders placed or  performed during the hospital encounter of 11/09/19 (from the past 24 hour(s))  Lactic acid, plasma     Status: Abnormal   Collection Time: 11/09/19  9:54 PM  Result Value Ref Range   Lactic Acid, Venous 5.8 (HH) 0.5 - 1.9 mmol/L  CBC WITH DIFFERENTIAL     Status: Abnormal   Collection Time: 11/09/19  9:54 PM  Result Value Ref Range   WBC 14.1 (H) 4.0 - 10.5 K/uL   RBC 5.70 4.22 - 5.81 MIL/uL   Hemoglobin 14.6 13.0 - 17.0 g/dL   HCT 16.145.0 09.639.0 - 04.552.0 %   MCV 78.9 (L) 80.0 - 100.0 fL   MCH 25.6 (L) 26.0 - 34.0 pg   MCHC 32.4 30.0 - 36.0 g/dL   RDW 40.914.5 81.111.5 - 91.415.5 %   Platelets 296 150 - 400 K/uL   nRBC 1.0 (H) 0.0 - 0.2 %   Neutrophils Relative % 79 %   Neutro Abs 11.2 (H) 1.7 - 7.7 K/uL   Lymphocytes Relative 15 %   Lymphs Abs 2.0 0.7 - 4.0 K/uL   Monocytes Relative 5 %   Monocytes Absolute 0.7 0.1 - 1.0 K/uL   Eosinophils Relative 0 %   Eosinophils Absolute 0.0 0.0 - 0.5 K/uL   Basophils Relative 0 %   Basophils Absolute 0.0 0.0 - 0.1 K/uL   Immature Granulocytes 1 %   Abs Immature Granulocytes 0.18 (H) 0.00 - 0.07 K/uL   Abnormal Lymphocytes Present PRESENT    Polychromasia PRESENT   Comprehensive metabolic panel     Status: Abnormal   Collection Time: 11/09/19  9:54 PM  Result Value Ref Range   Sodium 139 135 - 145 mmol/L  Potassium 4.1 3.5 - 5.1 mmol/L   Chloride 102 98 - 111 mmol/L   CO2 18 (L) 22 - 32 mmol/L   Glucose, Bld 195 (H) 70 - 99 mg/dL   BUN 30 (H) 6 - 20 mg/dL   Creatinine, Ser 1.61 (H) 0.61 - 1.24 mg/dL   Calcium 8.2 (L) 8.9 - 10.3 mg/dL   Total Protein 8.5 (H) 6.5 - 8.1 g/dL   Albumin 3.2 (L) 3.5 - 5.0 g/dL   AST 128 (H) 15 - 41 U/L   ALT 72 (H) 0 - 44 U/L   Alkaline Phosphatase 81 38 - 126 U/L   Total Bilirubin 0.8 0.3 - 1.2 mg/dL   GFR calc non Af Amer 48 (L) >60 mL/min   GFR calc Af Amer 56 (L) >60 mL/min   Anion gap 19 (H) 5 - 15  Fibrin derivatives D-Dimer     Status: Abnormal   Collection Time: 11/09/19  9:54 PM  Result Value Ref Range    Fibrin derivatives D-dimer ([facility]) >7,500.00 (H) 0.00 - 499.00 ng/mL (FEU)  Procalcitonin     Status: None   Collection Time: 11/09/19  9:54 PM  Result Value Ref Range   Procalcitonin 0.88 ng/mL  Lactate dehydrogenase     Status: Abnormal   Collection Time: 11/09/19  9:54 PM  Result Value Ref Range   LDH 1,089 (H) 98 - 192 U/L  Ferritin     Status: Abnormal   Collection Time: 11/09/19  9:54 PM  Result Value Ref Range   Ferritin 879 (H) 24 - 336 ng/mL  Triglycerides     Status: Abnormal   Collection Time: 11/09/19  9:54 PM  Result Value Ref Range   Triglycerides 164 (H) <150 mg/dL  Fibrinogen     Status: Abnormal   Collection Time: 11/09/19  9:54 PM  Result Value Ref Range   Fibrinogen 697 (H) 210 - 475 mg/dL  C-reactive protein     Status: Abnormal   Collection Time: 11/09/19  9:54 PM  Result Value Ref Range   CRP 21.8 (H) <1.0 mg/dL  Blood gas, venous     Status: Abnormal   Collection Time: 11/09/19 10:10 PM  Result Value Ref Range   FIO2 100.00    pH, Ven 7.51 (H) 7.250 - 7.430   pCO2, Ven 21 (L) 44.0 - 60.0 mmHg   pO2, Ven 70.0 (H) 32.0 - 45.0 mmHg   Bicarbonate 16.8 (L) 20.0 - 28.0 mmol/L   Acid-base deficit 4.1 (H) 0.0 - 2.0 mmol/L   O2 Saturation 95.5 %   Patient temperature 37.0    Collection site LEFT RADIAL    Sample type ARTERIAL DRAW   Blood Culture (routine x 2)     Status: None (Preliminary result)   Collection Time: 11/09/19 10:51 PM   Specimen: BLOOD LEFT HAND  Result Value Ref Range   Specimen Description BLOOD LEFT HAND    Special Requests      BOTTLES DRAWN AEROBIC AND ANAEROBIC Blood Culture adequate volume   Culture      NO GROWTH < 12 HOURS Performed at [facility], [address] [place], [place] [address]    Report Status PENDING   Blood Culture (routine x 2)     Status: None (Preliminary result)   Collection Time: 11/09/19 11:03 PM   Specimen: BLOOD  Result Value Ref Range   Specimen Description BLOOD RIGHT ANTECUBITAL     Special Requests      BOTTLES DRAWN AEROBIC AND  ANAEROBIC Blood Culture adequate volume   Culture      NO GROWTH < 12 HOURS Performed at [facility], [address] [place], [place] [address]    Report Status PENDING   Troponin I (High Sensitivity)     Status: Abnormal   Collection Time: 11/09/19 11:07 PM  Result Value Ref Range   Troponin I (High Sensitivity) 439 (HH) <18 ng/L  MRSA PCR Screening     Status: None   Collection Time: 11/10/19  6:42 AM   Specimen: Nasal Mucosa; Nasopharyngeal  Result Value Ref Range   MRSA by PCR NEGATIVE NEGATIVE  Glucose, capillary     Status: Abnormal   Collection Time: 11/10/19  6:53 AM  Result Value Ref Range   Glucose-Capillary 147 (H) 70 - 99 mg/dL  CBC with Differential/Platelet     Status: Abnormal   Collection Time: 11/10/19  7:55 AM  Result Value Ref Range   WBC 12.4 (H) 4.0 - 10.5 K/uL   RBC 5.17 4.22 - 5.81 MIL/uL   Hemoglobin 13.2 13.0 - 17.0 g/dL   HCT 83.1 51.7 - 61.6 %   MCV 78.9 (L) 80.0 - 100.0 fL   MCH 25.5 (L) 26.0 - 34.0 pg   MCHC 32.4 30.0 - 36.0 g/dL   RDW 07.3 71.0 - 62.6 %   Platelets 241 150 - 400 K/uL   nRBC 0.4 (H) 0.0 - 0.2 %   Neutrophils Relative % 83 %   Neutro Abs 10.2 (H) 1.7 - 7.7 K/uL   Lymphocytes Relative 11 %   Lymphs Abs 1.4 0.7 - 4.0 K/uL   Monocytes Relative 5 %   Monocytes Absolute 0.6 0.1 - 1.0 K/uL   Eosinophils Relative 0 %   Eosinophils Absolute 0.0 0.0 - 0.5 K/uL   Basophils Relative 0 %   Basophils Absolute 0.0 0.0 - 0.1 K/uL   WBC Morphology MORPHOLOGY UNREMARKABLE    RBC Morphology MORPHOLOGY UNREMARKABLE    Smear Review PLATELET CLUMPS NOTED ON SMEAR, UNABLE TO ESTIMATE    Immature Granulocytes 1 %   Abs Immature Granulocytes 0.16 (H) 0.00 - 0.07 K/uL  Comprehensive metabolic panel     Status: Abnormal   Collection Time: 11/10/19  7:55 AM  Result Value Ref Range   Sodium 140 135 - 145 mmol/L   Potassium 4.6 3.5 - 5.1 mmol/L   Chloride 105 98 - 111 mmol/L   CO2 22 22 -  32 mmol/L   Glucose, Bld 158 (H) 70 - 99 mg/dL   BUN 37 (H) 6 - 20 mg/dL   Creatinine, Ser 9.48 (H) 0.61 - 1.24 mg/dL   Calcium 7.8 (L) 8.9 - 10.3 mg/dL   Total Protein 7.8 6.5 - 8.1 g/dL   Albumin 3.0 (L) 3.5 - 5.0 g/dL   AST 546 (H) 15 - 41 U/L   ALT 77 (H) 0 - 44 U/L   Alkaline Phosphatase 78 38 - 126 U/L   Total Bilirubin 0.8 0.3 - 1.2 mg/dL   GFR calc non Af Amer 59 (L) >60 mL/min   GFR calc Af Amer >60 >60 mL/min   Anion gap 13 5 - 15  Ferritin     Status: Abnormal   Collection Time: 11/10/19  7:55 AM  Result Value Ref Range   Ferritin 663 (H) 24 - 336 ng/mL  Magnesium     Status: Abnormal   Collection Time: 11/10/19  7:55 AM  Result Value Ref Range   Magnesium 3.0 (H) 1.7 - 2.4  mg/dL  Phosphorus     Status: None   Collection Time: 11/10/19  7:55 AM  Result Value Ref Range   Phosphorus 4.3 2.5 - 4.6 mg/dL  Fibrin derivatives D-Dimer ([facility] only)     Status: Abnormal   Collection Time: 11/10/19  7:55 AM  Result Value Ref Range   Fibrin derivatives D-dimer ([facility]) >7,500.00 (H) 0.00 - 499.00 ng/mL (FEU)  Glucose, capillary     Status: Abnormal   Collection Time: 11/10/19  8:15 AM  Result Value Ref Range   Glucose-Capillary 140 (H) 70 - 99 mg/dL  Glucose, capillary     Status: Abnormal   Collection Time: 11/10/19 11:32 AM  Result Value Ref Range   Glucose-Capillary 136 (H) 70 - 99 mg/dL    Studies/Results: DG Chest 2 View  Result Date: 11/05/2019 CLINICAL DATA:  Cough, weakness EXAM: CHEST - 2 VIEW COMPARISON:  None. FINDINGS: There is patchy areas of interstitial disease bilaterally concerning for multifocal pneumonia including atypical viral pneumonia. There is no pleural effusion or pneumothorax. The heart and mediastinal contours are unremarkable. There is no acute osseous abnormality. IMPRESSION: Patchy areas of interstitial disease bilaterally concerning for multifocal pneumonia including atypical viral pneumonia. Electronically Signed   By: Elige Ko   On:  11/05/2019 12:42   DG Chest Port 1 View  Result Date: 11/09/2019 CLINICAL DATA:  Shortness of breath, difficulty breathing for 1 week, chills, nausea vomiting and dry cough, COVID-19 positive EXAM: PORTABLE CHEST 1 VIEW COMPARISON:  Radiograph 11/05/2019 FINDINGS: Markedly worsening bilateral is airspace opacities with indistinct pulmonary vascularity and trace bilateral effusions. Prominence of the cardiomediastinal silhouette, possibly accentuated by the portable technique. No acute osseous or soft tissue abnormality. Degenerative changes are present in the imaged spine and shoulders. IMPRESSION: Multifocal interstitial and airspace opacities throughout the lungs most compatible with infectious pneumonia/pneumonitis in the setting of COVID-19 positivity. Some underlying edema may be present as well. Electronically Signed   By: Kreg Shropshire M.D.   On: 11/09/2019 22:38    Scheduled Meds: . vitamin C  500 mg Oral Daily  . Chlorhexidine Gluconate Cloth  6 each Topical Q0600  . enoxaparin (LOVENOX) injection  0.5 mg/kg Subcutaneous Q12H  . insulin aspart  0-9 Units Subcutaneous Q4H  . methylPREDNISolone (SOLU-MEDROL) injection  0.5 mg/kg Intravenous Q12H  . sodium chloride flush  3 mL Intravenous Q12H  . zinc sulfate  220 mg Oral Daily   Continuous Infusions: . [START ON 11/11/2019] remdesivir 100 mg in NS 100 mL     PRN Meds:acetaminophen, guaiFENesin-dextromethorphan, guaiFENesin-dextromethorphan, HYDROcodone-acetaminophen, ondansetron **OR** ondansetron (ZOFRAN) IV  Assessment/Plan:   cute respiratory failure (HCC) due to COVID-19 virus and superimposed bacterial PNA:   - FROM HOME   WITH KNOWN HX OF COVID19; Initial positive on 11/05/2019  - given severity of illness initiate steroids Decadron  q 24 hours; continue And pharmacy consult for remdesivir -continue - IF Hypoxia and CRP >7 status post Actemra - Will follow daily d.dimer - Assess for ability to prone  - Supportive  management -Fluid sparing resuscitation  -Currently on 15 L high flow nasal cannula, and 50 L nonrebreather and maintaining O2 saturation over 93% - V/Q scan now - Heparin gtt for now with suspected VTE secondary to CoViD-19  - Follow D Dimer   - Consult PCCM.  Appreciate their recommendation -Transfer process to [facility] has been started.  [facility] notified.  Awaiting bed Family/ patient prognosis discussion: Admitting MD has discussed case with the family/ patient  who are aware of their prognosis At this point  (patient)  would like to be full code  care     . Type 2 diabetes, controlled, with peripheral neuropathy (HCC) -  - Order Sensitive  SSI   - HgA1C on to be 7.3  - Hold home p.o. meds -Continue to monitor blood glucose  . Hypertension -stable  -continue home meds  AKI: Creatinine found to be 1.37 on the day after admission -Likely from dehydration and Covid infection -Continue to monitor renal function -Encourage p.o. hydration -Avoid nephrotoxic agents  . Morbid obesity (HCC) -puts patient at risk risk of severe Covid -Weighted counseling  Other plan as per orders.  DVT prophylaxis:  Heparin gtt     Code Status:  FULL CODE  as per patient   Family Communication:   Family not at  Bedside   Disposition Plan:  Planning to transfer to [facility] for specialized care.  Then to home once workup is complete and patient is stable.                                 Consults called: none     LOS: 1 day   Baljit Liebert Harold Hedge

## 2019-11-10 NOTE — Discharge Summary (Signed)
Physician Discharge Summary  Nathaniel Webb UMP:[id number] DOB: 1966/04/15 DOA: 11/09/2019  PCP: Langston Reusing, NP  Admit date: 11/09/2019 Discharge date: 11/10/2019  Time spent:<30 minutes  Recommendations for Outpatient Follow-up:  none  Discharge Diagnoses:  Active Problems:   Type 2 diabetes, controlled, with peripheral neuropathy ([place])   Hypertension   Morbid obesity ([place])   Acute respiratory failure ([place])   Pneumonia due to COVID-19 virus   Discharge Condition: critical  Diet recommendation: npo  Filed Weights   11/09/19 2309  Weight: 106.1 kg    History of present illness:  Nathaniel Webb is a 53 y.o. male with medical history significant of COVID DM2, HLD, Obesity, tobacco abuse    Presented with cough intermittent fever generalized weakness for 1 week has been trying to take Tylenol for fever Was seen in the emergency department on 23 December and diagnosed with Covid infection. Patient was short of breath and EMS was called on arrival satting 50% Placed on nonrebreather 15 L Placed on heated high flow nasal cannula saturation 90% this time  Initially noted to be in respiratory distress Infectious risk factors:  Reports  fever, shortness of breath, dry cough,    KNOWN COVID POSITIVE    Chest x-ray showing multifocal opacities On High Flow [place] 15L  Hospital Course:  Pt admitted per COVID order protocol   Assessment/Plan   53 y.o. male with medical history significant of COVID DM2, HLD, Obesity, tobacco abuse Admitted for COVID PNA  Present on Admission: . Acute respiratory failure (HCC) due to . Pneumonia due to COVID-19 virus   FROM HOME   WITH KNOWN HX OF COVID19 Initial positive on 11/05/2019    Following concerning LAB/ imaging findings:  CBC: leukopenia, lymphopenia    BMP: increased BUN/Cr   LFTs: increased AST/ALT/Tbili   CRP, LDH: increased   IL-6 and Ferritin increased   Procalcitonin: low   CXR: hazy bilateral peripheral  opacities      -Following work-up initiated:          Following complications noted:    evidence of AKI - will provide gentle rehydration  elevated LFT's likely in the setting of COVID continue to follow    Plan of treatment: - Transfer to [facility] facility if   bed is available pt meats criteria for transfer     -given severity of illness initiate steroids Decadron 6mg  q 24 hours And pharmacy consult for remdesivir - IF Hypoxia and CRP >7 will attempt a trial of Actemra - Will follow daily d.dimer - Assess for ability to prone  - Supportive management -Fluid sparing resuscitation  -Provide oxygen as needed currently on   SpO2: 99 % O2 Flow Rate (L/min): 60 L/min( and nonrebreather mask) FiO2 (%): 100 % - IF d.dimer elvated >5 will increase dose of lovenox    - Consult PCCM if becomes respiratory unstable   Poor Prognostic factors  53 y.o.  Personal hx of DM2  HTN, obesity  Evidence of  organ damage  Present , AKI, respiratory failure requiring >4L [place]  tachypnea, tachycardia present on admission    Will order Airborne and Contact precautions  Family/ patient prognosis discussion: I have discussed case with the family/ patient  who are aware of their prognosis At this point  (patient)  would like to be full code  care      . Type 2 diabetes, controlled, with peripheral neuropathy (HCC) -  - Order Sensitive  SSI     -  check TSH and HgA1C  - Hold by mouth medications    . Hypertension - continue home meds  . Morbid obesity (HCC) -puts patient at risk risk of severe Covid   Other plan as per orders.  DVT prophylaxis:  Lovenox     Code Status:  FULL CODE  as per patient  I had personally discussed CODE STATUS with patient    Family Communication:   Family not at  Bedside    Disposition Plan:      To home once workup is complete and patient is stable   Procedures:  CLINICAL DATA:  Shortness of breath, difficulty breathing for 1 week, chills, nausea  vomiting and dry cough, COVID-19 positive  EXAM: PORTABLE CHEST 1 VIEW  COMPARISON:  Radiograph 11/05/2019  FINDINGS: Markedly worsening bilateral is airspace opacities with indistinct pulmonary vascularity and trace bilateral effusions. Prominence of the cardiomediastinal silhouette, possibly accentuated by the portable technique. No acute osseous or soft tissue abnormality. Degenerative changes are present in the imaged spine and shoulders.  IMPRESSION: Multifocal interstitial and airspace opacities throughout the lungs most compatible with infectious pneumonia/pneumonitis in the setting of COVID-19 positivity. Some underlying edema may be present as well. . Consultations:  none   Discharge Exam: Vitals:   11/10/19 0100 11/10/19 0115  BP: 117/78 107/75  Pulse: (!) 102 100  Resp: (!) 44 (!) 46  SpO2: 100% 99%   Blood pressure 115/81, pulse (!) 107, resp. rate (!) 53, height 5\' 5"  (1.651 m), weight 106.1 kg, SpO2 96 %. 1. General:  in  Acute distress increased work of breathing     acutely ill -appearing 2. Psychological: Alert and   Oriented 3. Head/ENT:   Moist   Mucous Membranes                          Head Non traumatic, neck supple                           Poor Dentition 4. SKIN:   decreased Skin turgor,  Skin clean Dry and intact no rash 5. Heart: Regular rate and rhythm no Murmur, no Rub or gallop 6. Lungs:   no wheezes or crackles   7. Abdomen: Soft,  non-tender, Non distended   Obese bowel sounds present 8. Lower extremities: no clubbing, cyanosis, no  edema 9. Neurologically Grossly intact, moving all 4 extremities equally   10. MSK: Normal range of motion Discharge Instructions  No Known Allergies    The results of significant diagnostics from this hospitalization (including imaging, microbiology, ancillary and laboratory) are listed below for reference.    Significant Diagnostic Studies: DG Chest 2 View  Result Date: 11/05/2019 CLINICAL  DATA:  Cough, weakness EXAM: CHEST - 2 VIEW COMPARISON:  None. FINDINGS: There is patchy areas of interstitial disease bilaterally concerning for multifocal pneumonia including atypical viral pneumonia. There is no pleural effusion or pneumothorax. The heart and mediastinal contours are unremarkable. There is no acute osseous abnormality. IMPRESSION: Patchy areas of interstitial disease bilaterally concerning for multifocal pneumonia including atypical viral pneumonia. Electronically Signed   By: 11/07/2019   On: 11/05/2019 12:42   DG Chest Port 1 View  Result Date: 11/09/2019 CLINICAL DATA:  Shortness of breath, difficulty breathing for 1 week, chills, nausea vomiting and dry cough, COVID-19 positive EXAM: PORTABLE CHEST 1 VIEW COMPARISON:  Radiograph 11/05/2019 FINDINGS: Markedly worsening bilateral is airspace opacities with indistinct  pulmonary vascularity and trace bilateral effusions. Prominence of the cardiomediastinal silhouette, possibly accentuated by the portable technique. No acute osseous or soft tissue abnormality. Degenerative changes are present in the imaged spine and shoulders. IMPRESSION: Multifocal interstitial and airspace opacities throughout the lungs most compatible with infectious pneumonia/pneumonitis in the setting of COVID-19 positivity. Some underlying edema may be present as well. Electronically Signed   By: Kreg ShropshirePrice  DeHay M.D.   On: 11/09/2019 22:38    Microbiology: No results found for this or any previous visit (from the past 240 hour(s)).   Labs: Basic Metabolic Panel: Recent Labs  Lab 11/05/19 1217 11/09/19 2154  NA 134* 139  K 3.8 4.1  CL 98 102  CO2 24 18*  GLUCOSE 124* 195*  BUN 17 30*  CREATININE 1.08 1.61*  CALCIUM 8.5* 8.2*   Liver Function Tests: Recent Labs  Lab 11/09/19 2154  AST 128*  ALT 72*  ALKPHOS 81  BILITOT 0.8  PROT 8.5*  ALBUMIN 3.2*   No results for input(s): LIPASE, AMYLASE in the last 168 hours. No results for input(s):  AMMONIA in the last 168 hours. CBC: Recent Labs  Lab 11/05/19 1217 11/09/19 2154  WBC 5.4 14.1*  NEUTROABS  --  11.2*  HGB 13.3 14.6  HCT 40.6 45.0  MCV 79.5* 78.9*  PLT 161 296   Cardiac Enzymes: No results for input(s): CKTOTAL, CKMB, CKMBINDEX, TROPONINI in the last 168 hours. BNP: BNP (last 3 results) No results for input(s): BNP in the last 8760 hours.  ProBNP (last 3 results) No results for input(s): PROBNP in the last 8760 hours.  CBG: No results for input(s): GLUCAP in the last 168 hours.    Signed:  Therisa DoyneAnastassia Monifah Freehling MD.  [facility] 11/10/2019, 2:12 AM

## 2019-11-10 NOTE — Progress Notes (Signed)
Spoke with nuclear med about VQ scan for patient but pt unable to tolerate lying flat for long periods at this time and pt desats with much movement. Pt is back in prone position.

## 2019-11-10 NOTE — Progress Notes (Signed)
ANTICOAGULATION CONSULT NOTE - Initial Consult  Pharmacy Consult for Heparin  Indication: VTE treatment (suspected)  No Known Allergies  Patient Measurements: Height: 5\' 5"  (165.1 cm) Weight: 234 lb (106.1 kg) IBW/kg (Calculated) : 61.5 Heparin Dosing Weight: 85.7 kg  Vital Signs: Temp: 98.5 F (36.9 C) (12/28 1500) Temp Source: Oral (12/28 1500) BP: 116/77 (12/28 1500) Pulse Rate: 73 (12/28 1500)  Labs: Recent Labs    11/09/19 2154 11/09/19 2307 11/10/19 0755 11/10/19 1226  HGB 14.6  --  13.2  --   HCT 45.0  --  40.8  --   PLT 296  --  241  --   CREATININE 1.61*  --  1.37*  --   TROPONINIHS  --  439*  --  310*    Estimated Creatinine Clearance: 70.7 mL/min (A) (by C-G formula based on SCr of 1.37 mg/dL (H)).   Medical History: Past Medical History:  Diagnosis Date  . Diabetes mellitus without complication ([place])   . Heel pain 06/09/2015  . High triglycerides 05/09/2016  . Hypertension   . Low HDL (under 40) 05/09/2016  . Obesity, Class II, BMI 35-39.9, with comorbidity ([place]) 01/04/2016    Assessment: 53 y.o.malewith medical history significant of COVID DM2, HLD, Obesity, tobacco abuse.  Pharmacy asked to initiate heparin drip for suspected VTE/PE. VQ scan ordered and pending at this time. Of note pt received enoxaparin 55 mg at 0906 this morning.   Goal of Therapy:  Heparin level 0.3-0.7 units/ml Monitor platelets by anticoagulation protocol: Yes   Plan:  Will order heparin half bolus due to recent enoxaparin dose then start heparin drip at 1400 units/hr.  Heparin level in 6h, CBC in AM  Pharmacy will continue to follow.   Rocky Morel 11/10/2019,4:14 PM

## 2019-11-10 NOTE — Progress Notes (Signed)
ANTICOAGULATION CONSULT NOTE - Initial Consult  Pharmacy Consult for Lovenox Indication: covid  No Known Allergies  Patient Measurements: Height: 5\' 5"  (165.1 cm) Weight: 234 lb (106.1 kg) IBW/kg (Calculated) : 61.5  Vital Signs: BP: 111/77 (12/28 0130) Pulse Rate: 99 (12/28 0130)  Labs: Recent Labs    11/09/19 2154  HGB 14.6  HCT 45.0  PLT 296  CREATININE 1.61*   Estimated Creatinine Clearance: 60.2 mL/min (A) (by C-G formula based on SCr of 1.61 mg/dL (H)).  Medical History: Past Medical History:  Diagnosis Date  . Diabetes mellitus without complication ([place])   . Heel pain 06/09/2015  . High triglycerides 05/09/2016  . Hypertension   . Low HDL (under 40) 05/09/2016  . Obesity, Class II, BMI 35-39.9, with comorbidity ([place]) 01/04/2016   Medications:  (Not in a hospital admission)  Assessment: Nathaniel Webb is a 53 y.o. male with medical history significant of COVID DM2, HLD, Obesity, tobacco abuse.  Pharmacy asked to initiate and monitor Lovenox dosing.   Goal of Therapy:  Anti-Xa level 0.6-1 units/ml 4hrs after LMWH dose given Monitor platelets by anticoagulation protocol: Yes   Plan:  Lovenox 0.5mg /Kg SQ q24hrs Monitor for s/sx of bleeding complications  [place] Crane, Tinleigh Whitmire A 11/10/2019,1:59 AM

## 2019-11-11 ENCOUNTER — Encounter (HOSPITAL_COMMUNITY): Payer: Self-pay | Admitting: Internal Medicine

## 2019-11-11 ENCOUNTER — Inpatient Hospital Stay (HOSPITAL_COMMUNITY): Payer: Medicaid Other

## 2019-11-11 ENCOUNTER — Inpatient Hospital Stay (HOSPITAL_COMMUNITY)
Admission: AD | Admit: 2019-11-11 | Discharge: 2020-01-07 | DRG: 163 | Disposition: A | Discharge status: Home / Self Care | Payer: Medicaid Other | Source: Other Acute Inpatient Hospital | Attending: Cardiothoracic Surgery | Admitting: Cardiothoracic Surgery

## 2019-11-11 ENCOUNTER — Other Ambulatory Visit: Payer: Self-pay

## 2019-11-11 DIAGNOSIS — Z419 Encounter for procedure for purposes other than remedying health state, unspecified: Secondary | ICD-10-CM

## 2019-11-11 DIAGNOSIS — N179 Acute kidney failure, unspecified: Secondary | ICD-10-CM | POA: Diagnosis not present

## 2019-11-11 DIAGNOSIS — J80 Acute respiratory distress syndrome: Secondary | ICD-10-CM

## 2019-11-11 DIAGNOSIS — J948 Other specified pleural conditions: Secondary | ICD-10-CM

## 2019-11-11 DIAGNOSIS — J860 Pyothorax with fistula: Secondary | ICD-10-CM

## 2019-11-11 DIAGNOSIS — Z6831 Body mass index (BMI) 31.0-31.9, adult: Secondary | ICD-10-CM

## 2019-11-11 DIAGNOSIS — J1282 Pneumonia due to coronavirus disease 2019: Secondary | ICD-10-CM | POA: Diagnosis present

## 2019-11-11 DIAGNOSIS — Z825 Family history of asthma and other chronic lower respiratory diseases: Secondary | ICD-10-CM

## 2019-11-11 DIAGNOSIS — E1142 Type 2 diabetes mellitus with diabetic polyneuropathy: Secondary | ICD-10-CM | POA: Diagnosis present

## 2019-11-11 DIAGNOSIS — I5033 Acute on chronic diastolic (congestive) heart failure: Secondary | ICD-10-CM | POA: Diagnosis present

## 2019-11-11 DIAGNOSIS — J939 Pneumothorax, unspecified: Secondary | ICD-10-CM

## 2019-11-11 DIAGNOSIS — U071 COVID-19: Secondary | ICD-10-CM | POA: Diagnosis present

## 2019-11-11 DIAGNOSIS — D62 Acute posthemorrhagic anemia: Secondary | ICD-10-CM | POA: Diagnosis not present

## 2019-11-11 DIAGNOSIS — R109 Unspecified abdominal pain: Secondary | ICD-10-CM | POA: Diagnosis not present

## 2019-11-11 DIAGNOSIS — J9601 Acute respiratory failure with hypoxia: Secondary | ICD-10-CM

## 2019-11-11 DIAGNOSIS — I2699 Other pulmonary embolism without acute cor pulmonale: Secondary | ICD-10-CM | POA: Diagnosis present

## 2019-11-11 DIAGNOSIS — T380X5A Adverse effect of glucocorticoids and synthetic analogues, initial encounter: Secondary | ICD-10-CM | POA: Diagnosis not present

## 2019-11-11 DIAGNOSIS — J690 Pneumonitis due to inhalation of food and vomit: Secondary | ICD-10-CM

## 2019-11-11 DIAGNOSIS — E871 Hypo-osmolality and hyponatremia: Secondary | ICD-10-CM | POA: Diagnosis not present

## 2019-11-11 DIAGNOSIS — J15211 Pneumonia due to Methicillin susceptible Staphylococcus aureus: Secondary | ICD-10-CM | POA: Diagnosis not present

## 2019-11-11 DIAGNOSIS — R0600 Dyspnea, unspecified: Secondary | ICD-10-CM

## 2019-11-11 DIAGNOSIS — Z7982 Long term (current) use of aspirin: Secondary | ICD-10-CM

## 2019-11-11 DIAGNOSIS — Z833 Family history of diabetes mellitus: Secondary | ICD-10-CM

## 2019-11-11 DIAGNOSIS — J9600 Acute respiratory failure, unspecified whether with hypoxia or hypercapnia: Secondary | ICD-10-CM

## 2019-11-11 DIAGNOSIS — Z4659 Encounter for fitting and adjustment of other gastrointestinal appliance and device: Secondary | ICD-10-CM

## 2019-11-11 DIAGNOSIS — J90 Pleural effusion, not elsewhere classified: Secondary | ICD-10-CM

## 2019-11-11 DIAGNOSIS — Z23 Encounter for immunization: Secondary | ICD-10-CM | POA: Diagnosis not present

## 2019-11-11 DIAGNOSIS — A4901 Methicillin susceptible Staphylococcus aureus infection, unspecified site: Secondary | ICD-10-CM

## 2019-11-11 DIAGNOSIS — J189 Pneumonia, unspecified organism: Secondary | ICD-10-CM

## 2019-11-11 DIAGNOSIS — Z87891 Personal history of nicotine dependence: Secondary | ICD-10-CM

## 2019-11-11 DIAGNOSIS — I110 Hypertensive heart disease with heart failure: Secondary | ICD-10-CM | POA: Diagnosis present

## 2019-11-11 DIAGNOSIS — R0902 Hypoxemia: Secondary | ICD-10-CM

## 2019-11-11 DIAGNOSIS — E1165 Type 2 diabetes mellitus with hyperglycemia: Secondary | ICD-10-CM | POA: Diagnosis not present

## 2019-11-11 DIAGNOSIS — E781 Pure hyperglyceridemia: Secondary | ICD-10-CM | POA: Diagnosis present

## 2019-11-11 DIAGNOSIS — E875 Hyperkalemia: Secondary | ICD-10-CM | POA: Diagnosis not present

## 2019-11-11 DIAGNOSIS — I248 Other forms of acute ischemic heart disease: Secondary | ICD-10-CM | POA: Diagnosis present

## 2019-11-11 DIAGNOSIS — I10 Essential (primary) hypertension: Secondary | ICD-10-CM | POA: Diagnosis present

## 2019-11-11 DIAGNOSIS — K567 Ileus, unspecified: Secondary | ICD-10-CM

## 2019-11-11 DIAGNOSIS — E785 Hyperlipidemia, unspecified: Secondary | ICD-10-CM | POA: Diagnosis present

## 2019-11-11 DIAGNOSIS — Z9889 Other specified postprocedural states: Secondary | ICD-10-CM

## 2019-11-11 DIAGNOSIS — Z9689 Presence of other specified functional implants: Secondary | ICD-10-CM

## 2019-11-11 DIAGNOSIS — R079 Chest pain, unspecified: Secondary | ICD-10-CM

## 2019-11-11 DIAGNOSIS — R319 Hematuria, unspecified: Secondary | ICD-10-CM | POA: Diagnosis not present

## 2019-11-11 DIAGNOSIS — IMO0002 Reserved for concepts with insufficient information to code with codable children: Secondary | ICD-10-CM | POA: Diagnosis present

## 2019-11-11 DIAGNOSIS — D638 Anemia in other chronic diseases classified elsewhere: Secondary | ICD-10-CM | POA: Diagnosis present

## 2019-11-11 DIAGNOSIS — K5900 Constipation, unspecified: Secondary | ICD-10-CM | POA: Diagnosis not present

## 2019-11-11 DIAGNOSIS — B59 Pneumocystosis: Secondary | ICD-10-CM

## 2019-11-11 DIAGNOSIS — A4101 Sepsis due to Methicillin susceptible Staphylococcus aureus: Secondary | ICD-10-CM | POA: Diagnosis present

## 2019-11-11 DIAGNOSIS — T502X5A Adverse effect of carbonic-anhydrase inhibitors, benzothiadiazides and other diuretics, initial encounter: Secondary | ICD-10-CM | POA: Diagnosis not present

## 2019-11-11 DIAGNOSIS — Z09 Encounter for follow-up examination after completed treatment for conditions other than malignant neoplasm: Secondary | ICD-10-CM

## 2019-11-11 DIAGNOSIS — I5032 Chronic diastolic (congestive) heart failure: Secondary | ICD-10-CM | POA: Diagnosis present

## 2019-11-11 DIAGNOSIS — Z79899 Other long term (current) drug therapy: Secondary | ICD-10-CM

## 2019-11-11 DIAGNOSIS — R0602 Shortness of breath: Secondary | ICD-10-CM

## 2019-11-11 DIAGNOSIS — R7881 Bacteremia: Secondary | ICD-10-CM | POA: Diagnosis present

## 2019-11-11 DIAGNOSIS — J869 Pyothorax without fistula: Secondary | ICD-10-CM

## 2019-11-11 DIAGNOSIS — M7989 Other specified soft tissue disorders: Secondary | ICD-10-CM

## 2019-11-11 HISTORY — DX: Acute respiratory failure with hypoxia: J96.01

## 2019-11-11 LAB — C-REACTIVE PROTEIN
CRP: 9.9 mg/dL — ABNORMAL HIGH (ref <1.0)
CRP: 8.6 mg/dL — ABNORMAL HIGH (ref <1.0)

## 2019-11-11 LAB — COMPREHENSIVE METABOLIC PANEL
ALT: 85 U/L — ABNORMAL HIGH (ref 0–44)
AST: 109 U/L — ABNORMAL HIGH (ref 15–41)
Albumin: 2.7 g/dL — ABNORMAL LOW (ref 3.5–5.0)
Alkaline Phosphatase: 92 U/L (ref 38–126)
Anion gap: 13 (ref 5–15)
BUN: 44 mg/dL — ABNORMAL HIGH (ref 6–20)
CO2: 23 mmol/L (ref 22–32)
Calcium: 7.7 mg/dL — ABNORMAL LOW (ref 8.9–10.3)
Chloride: 105 mmol/L (ref 98–111)
Creatinine, Ser: 1.21 mg/dL (ref 0.61–1.24)
GFR calc Af Amer: >60 mL/min (ref >60)
GFR calc non Af Amer: >60 mL/min (ref >60)
Glucose, Bld: 171 mg/dL — ABNORMAL HIGH (ref 70–99)
Potassium: 4.7 mmol/L (ref 3.5–5.1)
Sodium: 141 mmol/L (ref 135–145)
Total Bilirubin: 0.8 mg/dL (ref 0.3–1.2)
Total Protein: 7.5 g/dL (ref 6.5–8.1)

## 2019-11-11 LAB — GLUCOSE, CAPILLARY
Glucose-Capillary: 129 mg/dL — ABNORMAL HIGH (ref 70–99)
Glucose-Capillary: 155 mg/dL — ABNORMAL HIGH (ref 70–99)
Glucose-Capillary: 146 mg/dL — ABNORMAL HIGH (ref 70–99)
Glucose-Capillary: 161 mg/dL — ABNORMAL HIGH (ref 70–99)
Glucose-Capillary: 148 mg/dL — ABNORMAL HIGH (ref 70–99)
Glucose-Capillary: 160 mg/dL — ABNORMAL HIGH (ref 70–99)
Glucose-Capillary: 177 mg/dL — ABNORMAL HIGH (ref 70–99)

## 2019-11-11 LAB — CBC WITH DIFFERENTIAL/PLATELET
Abs Immature Granulocytes: 0.15 10*3/uL — ABNORMAL HIGH (ref 0.00–0.07)
Basophils Absolute: 0 10*3/uL (ref 0.0–0.1)
Basophils Relative: 0 %
Eosinophils Absolute: 0 10*3/uL (ref 0.0–0.5)
Eosinophils Relative: 0 %
HCT: 42.1 % (ref 39.0–52.0)
Hemoglobin: 13.5 g/dL (ref 13.0–17.0)
Immature Granulocytes: 1 %
Lymphocytes Relative: 8 %
Lymphs Abs: 1.1 10*3/uL (ref 0.7–4.0)
MCH: 25.8 pg — ABNORMAL LOW (ref 26.0–34.0)
MCHC: 32.1 g/dL (ref 30.0–36.0)
MCV: 80.5 fL (ref 80.0–100.0)
Monocytes Absolute: 0.5 10*3/uL (ref 0.1–1.0)
Monocytes Relative: 3 %
Neutro Abs: 12.2 10*3/uL — ABNORMAL HIGH (ref 1.7–7.7)
Neutrophils Relative %: 88 %
Platelets: 257 10*3/uL (ref 150–400)
RBC: 5.23 MIL/uL (ref 4.22–5.81)
RDW: 14.6 % (ref 11.5–15.5)
WBC: 13.9 10*3/uL — ABNORMAL HIGH (ref 4.0–10.5)
nRBC: 0.5 % — ABNORMAL HIGH (ref 0.0–0.2)

## 2019-11-11 LAB — ABO/RH
ABO/RH(D): B POS
ABO/RH(D): B POS

## 2019-11-11 LAB — PHOSPHORUS: Phosphorus: 4.2 mg/dL (ref 2.5–4.6)

## 2019-11-11 LAB — FERRITIN: Ferritin: 558 ng/mL — ABNORMAL HIGH (ref 24–336)

## 2019-11-11 LAB — FIBRIN DERIVATIVES D-DIMER (ARMC ONLY): Fibrin derivatives D-dimer (ARMC): >7500 ng/mL (FEU) — ABNORMAL HIGH (ref 0.00–499.00)

## 2019-11-11 LAB — MAGNESIUM: Magnesium: 3.3 mg/dL — ABNORMAL HIGH (ref 1.7–2.4)

## 2019-11-11 LAB — D-DIMER, QUANTITATIVE: D-Dimer, Quant: >20 ug/mL-FEU — ABNORMAL HIGH (ref 0.00–0.50)

## 2019-11-11 LAB — HEPARIN LEVEL (UNFRACTIONATED)
Heparin Unfractionated: 0.49 IU/mL (ref 0.30–0.70)
Heparin Unfractionated: 0.79 IU/mL — ABNORMAL HIGH (ref 0.30–0.70)

## 2019-11-11 MED ORDER — SALINE SPRAY 0.65 % NA SOLN
1.0000 | NASAL | Status: DC | PRN
  Administered 2019-11-12 – 2019-12-03 (×3): 1 via NASAL
  Filled 2019-11-11: qty 44

## 2019-11-11 MED ORDER — ZINC SULFATE 220 (50 ZN) MG PO CAPS
220.0000 mg | ORAL_CAPSULE | Freq: Every day | ORAL | Status: DC
  Administered 2019-11-12 – 2019-11-30 (×19): 220 mg via ORAL
  Filled 2019-11-11 (×18): qty 1

## 2019-11-11 MED ORDER — ASCORBIC ACID 500 MG PO TABS
500.0000 mg | ORAL_TABLET | Freq: Every day | ORAL | Status: DC
  Administered 2019-11-12 – 2019-11-30 (×19): 500 mg via ORAL
  Filled 2019-11-11 (×19): qty 1

## 2019-11-11 MED ORDER — ACETAMINOPHEN 325 MG PO TABS
650.0000 mg | ORAL_TABLET | Freq: Four times a day (QID) | ORAL | Status: DC | PRN
  Administered 2019-11-19 – 2019-12-17 (×17): 650 mg via ORAL
  Filled 2019-11-11 (×20): qty 2

## 2019-11-11 MED ORDER — ONDANSETRON HCL 4 MG PO TABS: 4.0000 mg | ORAL_TABLET | Freq: Four times a day (QID) | ORAL | Status: DC | PRN

## 2019-11-11 MED ORDER — SODIUM CHLORIDE 0.9 % IV SOLN: 200.0000 mg | Freq: Once | INTRAVENOUS | Status: DC

## 2019-11-11 MED ORDER — SODIUM CHLORIDE 0.9 % IV SOLN
500.0000 mg | INTRAVENOUS | Status: AC
  Administered 2019-11-11 – 2019-11-13 (×3): 500 mg via INTRAVENOUS
  Filled 2019-11-11 (×3): qty 500

## 2019-11-11 MED ORDER — HYDROCOD POLST-CPM POLST ER 10-8 MG/5ML PO SUER
5.0000 mL | Freq: Two times a day (BID) | ORAL | Status: DC | PRN
  Administered 2019-11-17 – 2019-11-22 (×5): 5 mL via ORAL
  Filled 2019-11-11 (×5): qty 5

## 2019-11-11 MED ORDER — IOHEXOL 350 MG/ML SOLN
80.0000 mL | Freq: Once | INTRAVENOUS | Status: AC | PRN
  Administered 2019-11-11: 80 mL via INTRAVENOUS

## 2019-11-11 MED ORDER — SODIUM CHLORIDE 0.9% FLUSH
3.0000 mL | Freq: Two times a day (BID) | INTRAVENOUS | Status: DC
  Administered 2019-11-11 – 2019-12-01 (×39): 3 mL via INTRAVENOUS
  Administered 2019-12-01: 10 mL via INTRAVENOUS
  Administered 2019-12-02 – 2019-12-17 (×15): 3 mL via INTRAVENOUS

## 2019-11-11 MED ORDER — METHYLPREDNISOLONE SODIUM SUCC 125 MG IJ SOLR
0.5000 mg/kg | Freq: Two times a day (BID) | INTRAMUSCULAR | Status: AC
  Administered 2019-11-11 – 2019-11-20 (×19): 53.125 mg via INTRAVENOUS
  Filled 2019-11-11 (×19): qty 2

## 2019-11-11 MED ORDER — SODIUM CHLORIDE 0.9 % IV SOLN
1.0000 g | INTRAVENOUS | Status: AC
  Administered 2019-11-11 – 2019-11-15 (×5): 1 g via INTRAVENOUS
  Filled 2019-11-11 (×5): qty 10

## 2019-11-11 MED ORDER — SODIUM CHLORIDE 0.9% FLUSH
3.0000 mL | INTRAVENOUS | Status: DC | PRN
  Administered 2019-11-15 – 2019-11-26 (×2): 3 mL via INTRAVENOUS

## 2019-11-11 MED ORDER — GUAIFENESIN-DM 100-10 MG/5ML PO SYRP
10.0000 mL | ORAL_SOLUTION | ORAL | Status: DC | PRN
  Administered 2019-11-19 – 2019-12-15 (×14): 10 mL via ORAL
  Filled 2019-11-11 (×16): qty 10

## 2019-11-11 MED ORDER — INSULIN ASPART 100 UNIT/ML ~~LOC~~ SOLN
0.0000 [IU] | Freq: Three times a day (TID) | SUBCUTANEOUS | Status: DC
  Administered 2019-11-11: 4 [IU] via SUBCUTANEOUS
  Administered 2019-11-12: 3 [IU] via SUBCUTANEOUS
  Administered 2019-11-12 (×2): 4 [IU] via SUBCUTANEOUS
  Administered 2019-11-15: 09:00:00 3 [IU] via SUBCUTANEOUS
  Administered 2019-11-15: 4 [IU] via SUBCUTANEOUS
  Administered 2019-11-15: 13:00:00 3 [IU] via SUBCUTANEOUS
  Administered 2019-11-16 – 2019-11-17 (×4): 4 [IU] via SUBCUTANEOUS
  Administered 2019-11-17: 3 [IU] via SUBCUTANEOUS
  Administered 2019-11-17 – 2019-11-18 (×2): 7 [IU] via SUBCUTANEOUS
  Administered 2019-11-18: 4 [IU] via SUBCUTANEOUS
  Administered 2019-11-18: 11 [IU] via SUBCUTANEOUS
  Administered 2019-11-19: 4 [IU] via SUBCUTANEOUS
  Administered 2019-11-19: 7 [IU] via SUBCUTANEOUS
  Administered 2019-11-19: 4 [IU] via SUBCUTANEOUS
  Administered 2019-11-20: 11 [IU] via SUBCUTANEOUS
  Administered 2019-11-20: 4 [IU] via SUBCUTANEOUS
  Administered 2019-11-20: 3 [IU] via SUBCUTANEOUS
  Administered 2019-11-21: 11 [IU] via SUBCUTANEOUS
  Administered 2019-11-21 – 2019-11-22 (×4): 7 [IU] via SUBCUTANEOUS
  Administered 2019-11-22 – 2019-11-23 (×2): 11 [IU] via SUBCUTANEOUS
  Administered 2019-11-23: 4 [IU] via SUBCUTANEOUS
  Administered 2019-11-23: 15 [IU] via SUBCUTANEOUS
  Administered 2019-11-24: 11 [IU] via SUBCUTANEOUS
  Administered 2019-11-24 (×2): 15 [IU] via SUBCUTANEOUS
  Administered 2019-11-25: 7 [IU] via SUBCUTANEOUS
  Administered 2019-11-25: 4 [IU] via SUBCUTANEOUS
  Administered 2019-11-26 (×2): 20 [IU] via SUBCUTANEOUS
  Administered 2019-11-26: 3 [IU] via SUBCUTANEOUS
  Administered 2019-11-27: 4 [IU] via SUBCUTANEOUS
  Administered 2019-11-27: 20 [IU] via SUBCUTANEOUS
  Administered 2019-11-28: 15 [IU] via SUBCUTANEOUS
  Administered 2019-11-28: 7 [IU] via SUBCUTANEOUS
  Administered 2019-11-29 – 2019-11-30 (×3): 4 [IU] via SUBCUTANEOUS
  Administered 2019-11-30: 3 [IU] via SUBCUTANEOUS
  Administered 2019-11-30: 4 [IU] via SUBCUTANEOUS
  Administered 2019-12-01: 3 [IU] via SUBCUTANEOUS
  Administered 2019-12-01 (×2): 4 [IU] via SUBCUTANEOUS
  Administered 2019-12-02: 11 [IU] via SUBCUTANEOUS
  Administered 2019-12-02: 18:00:00 7 [IU] via SUBCUTANEOUS
  Administered 2019-12-02: 08:00:00 3 [IU] via SUBCUTANEOUS
  Administered 2019-12-03: 7 [IU] via SUBCUTANEOUS
  Administered 2019-12-03: 12:00:00 4 [IU] via SUBCUTANEOUS
  Administered 2019-12-04: 7 [IU] via SUBCUTANEOUS
  Administered 2019-12-04: 4 [IU] via SUBCUTANEOUS
  Administered 2019-12-05: 17:00:00 7 [IU] via SUBCUTANEOUS
  Administered 2019-12-05 (×2): 4 [IU] via SUBCUTANEOUS
  Administered 2019-12-08 – 2019-12-09 (×3): 3 [IU] via SUBCUTANEOUS
  Administered 2019-12-10: 14:00:00 4 [IU] via SUBCUTANEOUS
  Administered 2019-12-12 – 2019-12-16 (×5): 3 [IU] via SUBCUTANEOUS

## 2019-11-11 MED ORDER — ONDANSETRON HCL 4 MG/2ML IJ SOLN
4.0000 mg | Freq: Four times a day (QID) | INTRAMUSCULAR | Status: DC | PRN
  Administered 2019-11-26: 4 mg via INTRAVENOUS
  Filled 2019-11-11: qty 2

## 2019-11-11 MED ORDER — POLYETHYLENE GLYCOL 3350 17 G PO PACK: 17.0000 g | PACK | Freq: Every day | ORAL | Status: DC | PRN

## 2019-11-11 MED ORDER — SODIUM CHLORIDE 0.9 % IV SOLN: 250.0000 mL | INTRAVENOUS | Status: DC | PRN

## 2019-11-11 MED ORDER — ENOXAPARIN SODIUM 120 MG/0.8ML ~~LOC~~ SOLN
1.0000 mg/kg | Freq: Two times a day (BID) | SUBCUTANEOUS | Status: DC
  Administered 2019-11-11 – 2019-11-21 (×20): 105 mg via SUBCUTANEOUS
  Filled 2019-11-11 (×21): qty 0.8

## 2019-11-11 MED ORDER — ALBUTEROL SULFATE HFA 108 (90 BASE) MCG/ACT IN AERS
2.0000 | INHALATION_SPRAY | RESPIRATORY_TRACT | Status: DC | PRN
  Administered 2019-11-29 – 2019-12-02 (×2): 2 via RESPIRATORY_TRACT
  Filled 2019-11-11: qty 6.7

## 2019-11-11 MED ORDER — SODIUM CHLORIDE 0.9 % IV SOLN: 100.0000 mg | Freq: Every day | INTRAVENOUS | Status: DC

## 2019-11-11 MED ORDER — SODIUM CHLORIDE 0.9 % IV SOLN
100.0000 mg | Freq: Every day | INTRAVENOUS | Status: AC
  Administered 2019-11-12 – 2019-11-14 (×3): 100 mg via INTRAVENOUS
  Filled 2019-11-11 (×3): qty 20

## 2019-11-11 NOTE — Discharge Summary (Signed)
Physician Discharge Summary  Patient ID: Nathaniel Webb MRN: [id number] DOB/AGE: [birth year] 53 y.o.  Admit date: 11/09/2019 Discharge date: 11/11/2019  Admission Diagnoses:  Discharge Diagnoses:  Active Problems:   Type 2 diabetes, controlled, with peripheral neuropathy (HCC)   Hypertension   Morbid obesity (HCC)   Acute respiratory failure (HCC)   Pneumonia due to COVID-19 virus   COVID-19   Discharged Condition: Guarded   Hospital Course:  A 53 y.o.malewith medical history significant of COVID DM2, HLD, Obesity, tobacco abuse presented withcough intermittent fever generalized weakness for 1 week has been trying to take Tylenol for fever as seen in the emergency department on 23 December and diagnosed with Covid infection. Patient was short of breath and EMS was called on arrival satting 50% Placed on nonrebreather 15 L.  Placed on heated high flow nasal cannula saturation 90% this time Initially noted to be in respiratory distress Infectious risk factors: Reports fever, shortness of breath, dry cough,  KNOWN COVID POSITIVE  Chest x-ray showing multifocal opacities On NRB [place] 15L  Acute respiratory failure (HCC) due toCOVID-19 virus and superimposed bacterial PNA:  - FROM HOME WITH KNOWN HX OF COVID19; Initial positive on 11/05/2019 - given severity of illness initiate steroids Decadron 6mg  q 24 hours; continue And pharmacy consult for remdesivir -continue - IF Hypoxia and CRP >7 status post Actemra - Will follow daily d.dimer - Assess for ability to prone - Supportive management -Fluid sparing resuscitation -Currently on 15 L nonrebreather and maintaining O2 saturation over 93% - V/Q scan now - Heparin gtt for now with 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  Appreciate their recommendation -Transfer process to [facility] has been started.  [facility] notified.  Awaiting bed Family/ patient prognosis discussion:  Admitting MD has discussed case with the family/ patient who are aware of their prognosis At this point (patient) would like to be full code care   .Type 2 diabetes, controlled, with peripheral neuropathy (HCC) -- Order Sensitive SSI  - HgA1C on to be 7.3 - Hold home p.o. meds -Continue to monitor blood glucose  .Hypertension -stable  -continue home meds  AKI: Creatinine found to be 1.37 on the day after admission -Likely from dehydration and Covid infection -Continue to monitor renal function -Encourage p.o. hydration -Avoid nephrotoxic agents  .Morbid obesity (HCC) -puts patient at risk risk of severe Covid -Weighted counseling  Other plan as per orders.  DVT prophylaxis:Heparin gtt   Code Status:FULL CODE as per patient   Family Communication:Family not at Bedside  Disposition Plan: Planning to transfer to [facility] for specialized care.  Then to home once workup is complete and patient is stable.  Consults: Pulm CC  Significant Diagnostic Studies: Radiology and labs   Treatments: as above   Discharge Exam: Blood pressure 119/66, pulse 66, temperature 98.5 F (36.9 C), temperature source Oral, resp. rate (!) 23, height 5\' 5"  (1.651 m), weight 106.1 kg, SpO2 94 %.  1. General: Was lying on semiprone position.  Somewhat lethargic acutely ill -appearing 3. Head/ENT: Moist Mucous Membranes Head Non traumatic, neck supple Poor Dentition 4. SKIN: decreased Skin turgor, Skin clean Dry and intact no rash 5. Heart: Regular rate and rhythm no Murmur, no Rub or gallop 6. Lungs: Coarse 7. Abdomen: Soft, non-tender, Non distended Obese bowel sounds present 8. Lower extremities: no clubbing, cyanosis, no edema 9. Neurologically Grossly intact, moving all 4 extremities equally  10. MSK: Normal range of motion  Disposition: Discharge disposition: 02-Transferred to [facility]      Pt is to be transferred to [facility] for specialized CoViD care today  Discharge Instructions    Bed rest   Complete by: As directed    Diet - low sodium heart healthy   Complete by: As directed      Allergies as of 11/11/2019   No Known Allergies     Medication List    STOP taking these medications   accu-chek soft touch lancets   Acetaminophen 500 MG coapsule Replaced by: acetaminophen 325 MG tablet   atorvastatin 10 MG tablet Commonly known as: LIPITOR   Bayer Aspirin EC Low Dose 81 MG EC tablet Generic drug: aspirin   BLOOD GLUCOSE METER DISPOSABLE Devi   CONTOUR BLOOD GLUCOSE SYSTEM Devi   fluticasone 50 MCG/ACT nasal spray Commonly known as: Flonase   gabapentin 100 MG capsule Commonly known as: NEURONTIN   lisinopril 20 MG tablet Commonly known as: ZESTRIL   metFORMIN 500 MG tablet Commonly known as: GLUCOPHAGE   omega-3 acid ethyl esters 1 g capsule Commonly known as: Lovaza     TAKE these medications   acetaminophen 325 MG tablet Commonly known as: TYLENOL Take 2 tablets (650 mg total) by mouth every 6 (six) hours as needed for mild pain or headache (fever >/= 101). Replaces: Acetaminophen 500 MG coapsule   ascorbic acid 500 MG tablet Commonly known as: VITAMIN C Take 1 tablet (500 mg total) by mouth daily.   azithromycin 500 mg in sodium chloride 0.9 % 250 mL Inject 500 mg into the vein daily.   cefTRIAXone 1 g in sodium chloride 0.9 % 100 mL Inject 1 g into the vein daily.   guaiFENesin-dextromethorphan 100-10 MG/5ML syrup Commonly known as: ROBITUSSIN DM Take 10 mLs by mouth every 4 (four) hours as needed for cough.   heparin 25000-0.45 UT/250ML-% infusion Inject 1,400 Units/hr into the vein continuous.   insulin aspart 100 UNIT/ML injection Commonly known as: novoLOG Inject 0-9 Units into the skin every 4 (four) hours.   ondansetron 4 MG tablet Commonly known as: ZOFRAN Take 1 tablet (4 mg total) by mouth every 6  (six) hours as needed for nausea.   ondansetron 4 MG/2ML Soln injection Commonly known as: ZOFRAN Inject 2 mLs (4 mg total) into the vein every 6 (six) hours as needed for nausea.   sodium chloride flush 0.9 % Soln Commonly known as: NS Inject 3 mLs into the vein every 12 (twelve) hours.   zinc sulfate 220 (50 Zn) MG capsule Take 1 capsule (220 mg total) by mouth daily.       Signed: Thornell Mule 11/11/2019, 8:50 AM

## 2019-11-11 NOTE — Progress Notes (Signed)
Received pt from [place] ICU on 15L NRB with O2 sats 89-90%. Pt was also on heparin gtt at 12.5 units/hr. Ordered placed by MD to stop gtt and start lovenox 1 hr after heparin is stopped. Heparin was stopped around 12:45pm, will give lovenox as ordered.

## 2019-11-11 NOTE — Progress Notes (Signed)
VASCULAR LAB PRELIMINARY  PRELIMINARY  PRELIMINARY  PRELIMINARY  Bilateral lower extremity venous duplex completed.    Preliminary report:  See CV proc for preliminary results.  Ezekiel Menzer, RVT 11/11/2019, 2:52 PM

## 2019-11-11 NOTE — Progress Notes (Signed)
ANTICOAGULATION CONSULT NOTE - Initial Consult  Pharmacy Consult for Heparin  Indication: VTE treatment (suspected)  No Known Allergies  Patient Measurements: Height: 5\' 5"  (165.1 cm) Weight: 234 lb (106.1 kg) IBW/kg (Calculated) : 61.5 Heparin Dosing Weight: 85.7 kg  Vital Signs: Temp: 98.2 F (36.8 C) (12/29 0000) Temp Source: Oral (12/29 0000) BP: 133/74 (12/29 0000) Pulse Rate: 74 (12/29 0000)  Labs: Recent Labs    11/09/19 2154 11/09/19 2307 11/10/19 0755 11/10/19 1226 11/10/19 2355  HGB 14.6  --  13.2  --   --   HCT 45.0  --  40.8  --   --   PLT 296  --  241  --   --   HEPARINUNFRC  --   --   --   --  0.79*  CREATININE 1.61*  --  1.37*  --   --   TROPONINIHS  --  439*  --  310*  --     Estimated Creatinine Clearance: 70.7 mL/min (A) (by C-G formula based on SCr of 1.37 mg/dL (H)).   Medical History: Past Medical History:  Diagnosis Date  . Diabetes mellitus without complication ([place])   . Heel pain 06/09/2015  . High triglycerides 05/09/2016  . Hypertension   . Low HDL (under 40) 05/09/2016  . Obesity, Class II, BMI 35-39.9, with comorbidity ([place]) 01/04/2016    Assessment: 53 y.o.malewith medical history significant of COVID DM2, HLD, Obesity, tobacco abuse.  Pharmacy asked to initiate heparin drip for suspected VTE/PE. VQ scan ordered and pending at this time. Of note pt received enoxaparin 55 mg at 0906 this morning.   Goal of Therapy:  Heparin level 0.3-0.7 units/ml Monitor platelets by anticoagulation protocol: Yes   Plan:  12/29 @ 0000 HL 0.79 supratherapeutic. No bleeding issues per RN will decrease rate to 1250 units/hr and will recheck HL @ 0700.  Pharmacy will continue to follow.   Tobie Lords, PharmD, BCPS Clinical Pharmacist 11/11/2019,12:55 AM

## 2019-11-11 NOTE — Progress Notes (Deleted)
The rationale for the off label use of Actemra its known side effects, potential benefits was  discussed with patient .The use of Actemra is based on published clinical articles/anecdotal data as several randomized trials have been negative, but lately there have been a few positive randomized trials as well.  There is no history of tuberculosis, hepatitis B or C.  Complete risks and long-term side effects are unknown, however in the best clinical judgment it is felt that the clinical benefit at this time outweighs medical risks given tenuous clinical state of the patient.  Patient agrees with the treatment plan and consent to the use of Actemra.   

## 2019-11-11 NOTE — H&P (Addendum)
Please see H&P, discharge summary-done by my partners at [facility].  Patient is a 53 year old with history of DM-2, HTN, HLD, obesity-who presented to [facility] with approximately 1 week history of gradually worsening shortness of breath.  He was found to have acute hypoxic respiratory failure and was requiring high flow oxygen to maintain O2 saturations.  Chest x-ray was typical of Covid pneumonitis.    Patient stated had a URI symptoms, weakness that started before his shortness of breath.  He presented to the ED on 12/23 and was diagnosed with COVID-19-but was discharged home.  Subjective: Thinks his breathing is better-does not appear in any distress.  On 100% NRB.  Objective: Vitals:Blood pressure 122/66, pulse 75, temperature 99.1 F (37.3 C), temperature source Axillary, resp. rate 20, SpO2 90 %. General exam: Awake alert-easily talking in full sentences. Chest: Few bibasilar rales but mostly clear. CVS: S1-S2-regular.  Slightly tachycardic Abdomen: Soft nontender nondistended Extremities: No edema Neurology: Nonfocal  Labs: WBC 13.9, hemoglobin 13.5, platelet 257 Creatinine 1.21, AST/ALT 109/85 CRP on 12/28-18.1 Calcitonin on 12/27 0.88  Assessment and plan:  Acute hypoxic respiratory failure: Appears to have severe hypoxemia-secondary to COVID-19-although procalcitonin is minimally elevated-I doubt patient has any concurrent bacterial pneumonia.  However given his severity of his symptoms-I think it is prudent to cover him with empiric Rocephin and Zithromax for a few days.  We will continue with steroids but change to Solu-Medrol-and continue remdesivir.  Long discussion with patient regarding rationale, risks, benefits of Actemra-he has consented.  Addendum: patient already received Actemra yesterday at [facility] hold off for now. CRP pending today  Significantly elevated fibrin derivative product: check D-dimer levels here-was on therapeutic anticoagulation which is being  continued-but will switch to Lovenox.  Will get lower extremity Doppler-May need a CT angio of the chest depending on D-dimer levels..  Addendum: D Dimer>20 Doppler neg for DVT  CTA chest ordered  Addendum: d/w radiology-small distal PE present-no Right heart strain. Has extensive parenchymal disease likely causing hypoxia.  Mild transaminitis: Secondary to COVID-19-follow for now.  Minimally elevated troponin level: Downtrending-EKG reassuring-likely demand ischemia.  Doubt any further work-up is required.  DM-2: Monitor CBGs on SSI  HTN: Monitor closely-BP currently stable.  Resume usual antihypertensives if BP starts increasing.  DVT prophylaxis: Lovenox-see above  CODE STATUS: Full code

## 2019-11-11 NOTE — Progress Notes (Signed)
Pt being transferred to [facility] at this time. VSS on 100% nonrebreather. Wife was called and updated. Will call report to [facility].

## 2019-11-11 NOTE — Progress Notes (Signed)
Attempted to call report to [facility]. They were unable to take report at this time and said they would call back.

## 2019-11-11 NOTE — Progress Notes (Addendum)
ANTICOAGULATION CONSULT NOTE  Pharmacy Consult for Heparin  Indication: VTE treatment (suspected)  No Known Allergies  Patient Measurements: Height: 5\' 5"  (165.1 cm) Weight: 234 lb (106.1 kg) IBW/kg (Calculated) : 61.5 Heparin Dosing Weight: 85.7 kg  Vital Signs: Temp: 98.5 F (36.9 C) (12/29 1055) Temp Source: Oral (12/29 0800) BP: 148/80 (12/29 1055) Pulse Rate: 90 (12/29 1055)  Labs: Recent Labs    11/09/19 2154 11/09/19 2307 11/10/19 0755 11/10/19 1226 11/10/19 2355 11/11/19 0656  HGB 14.6  --  13.2  --   --  13.5  HCT 45.0  --  40.8  --   --  42.1  PLT 296  --  241  --   --  257  HEPARINUNFRC  --   --   --   --  0.79* 0.49  CREATININE 1.61*  --  1.37*  --   --  1.21  TROPONINIHS  --  439*  --  310*  --   --     Estimated Creatinine Clearance: 80.1 mL/min (by C-G formula based on SCr of 1.21 mg/dL).   Medical History: Past Medical History:  Diagnosis Date  . Diabetes mellitus without complication ([place])   . Heel pain 06/09/2015  . High triglycerides 05/09/2016  . Hypertension   . Low HDL (under 40) 05/09/2016  . Obesity, Class II, BMI 35-39.9, with comorbidity ([place]) 01/04/2016    Assessment: 53 y.o.malewith medical history significant of COVID DM2, HLD, Obesity, tobacco abuse.  Pharmacy asked to initiate heparin drip for suspected VTE/PE.   Goal of Therapy:  Heparin level 0.3-0.7 units/ml Monitor platelets by anticoagulation protocol: Yes   Plan:  12/29 0656 HL 0.49 therapeutic x 1. Will continue heparin at 1250 units/hr. Patient to transfer to [facility] now. Repeat HL should be around 1300 to confirm.  Pharmacy will continue to follow.   Dorena Bodo, PharmD Clinical Pharmacist 11/11/2019,11:05 AM

## 2019-11-11 NOTE — Plan of Care (Signed)
  Problem: Education: Goal: Knowledge of risk factors and measures for prevention of condition will improve Outcome: Progressing   Problem: Coping: Goal: Psychosocial and spiritual needs will be supported Outcome: Progressing   Problem: Respiratory: Goal: Will maintain a patent airway Outcome: Progressing Goal: Complications related to the disease process, condition or treatment will be avoided or minimized Outcome: Progressing   

## 2019-11-11 NOTE — Plan of Care (Signed)
Pt A&O x4, on 15L NRB mask, VSS. Urinal at bedside. Encouraged to lay on his side and to prone. No s/s of distress at this time, will cont to monitor

## 2019-11-11 NOTE — Consult Note (Signed)
CRITICAL CARE PROGRESS NOTE    Name: Nathaniel Webb MRN: [id number] DOB: 06/03/1966     LOS: 2   SUBJECTIVE FINDINGS & SIGNIFICANT EVENTS   Patient description:   53 year old with a history of morbid obesity, tobacco abuse, dyslipidemia diabetes who came in with few days of cough malaise fevers positive for COVID-19 found to be acutely hypoxemic and was brought up to the stepdown unit for acute hypoxemic respiratory failure.  Critical care consultation placed for increased oxygen requirement with severe acute hypoxemia and ARDS secondary to COVID-19  Lines / Drains: Peripheral IV x2  Cultures / Sepsis markers: Respiratory culture, nasal MRSA PCR, blood culture  Antibiotics: Remdesivir Actemra Azithromycin and Rocephin  Protocols / Consultants: VQ scan  Tests / Events: 12/28-PE protocol unable to perform due to inability to lay in supine position 12/29 - patient improved, able to lay supine, GFR improved,  plan for CTPE today.  Patient continues to cough.    PAST MEDICAL HISTORY   Past Medical History:  Diagnosis Date  . Diabetes mellitus without complication ([facility])   . Heel pain 06/09/2015  . High triglycerides 05/09/2016  . Hypertension   . Low HDL (under 40) 05/09/2016  . Obesity, Class II, BMI 35-39.9, with comorbidity ([facility]) 01/04/2016     SURGICAL HISTORY   No past surgical history on file.   FAMILY HISTORY   Family History  Problem Relation Age of Onset  . Diabetes Father   . COPD Mother      SOCIAL HISTORY   Social History   Tobacco Use  . Smoking status: Former Games developermoker  . Smokeless tobacco: Never Used  Substance Use Topics  . Alcohol use: No    Alcohol/week: 0.0 standard drinks  . Drug use: No     MEDICATIONS   Current Medication:  Current Facility-Administered  Medications:  .  acetaminophen (TYLENOL) tablet 650 mg, 650 mg, Oral, Q6H PRN, Doutova, Anastassia, MD .  ascorbic acid (VITAMIN C) tablet 500 mg, 500 mg, Oral, Daily, Doutova, Anastassia, MD, 500 mg at 11/11/19 0815 .  azithromycin (ZITHROMAX) 500 mg in sodium chloride 0.9 % 250 mL IVPB, 500 mg, Intravenous, Q24H, Mozell Haber, MD, Last Rate: 250 mL/hr at 11/10/19 2202, 500 mg at 11/10/19 2202 .  cefTRIAXone (ROCEPHIN) 1 g in sodium chloride 0.9 % 100 mL IVPB, 1 g, Intravenous, Q24H, Delonna Ney, MD, Last Rate: 200 mL/hr at 11/10/19 2058, 1 g at 11/10/19 2058 .  Chlorhexidine Gluconate Cloth 2 % PADS 6 each, 6 each, Topical, Q0600, Thomasenia BottomsAlam, Tawfikul, MD .  guaiFENesin-dextromethorphan (ROBITUSSIN DM) 100-10 MG/5ML syrup 10 mL, 10 mL, Oral, Q4H PRN, Doutova, Anastassia, MD .  heparin ADULT infusion 100 units/mL (25000 units/27350mL sodium chloride 0.45%), 1,250 Units/hr, Intravenous, Continuous, Thomasenia BottomsAlam, Tawfikul, MD, Stopped at 11/11/19 0830 .  HYDROcodone-acetaminophen (NORCO/VICODIN) 5-325 MG per tablet 1-2 tablet, 1-2 tablet, Oral, Q4H PRN, Therisa Doyneoutova, Anastassia, MD, 1 tablet at 11/10/19 0840 .  insulin aspart (novoLOG) injection 0-9 Units, 0-9 Units, Subcutaneous, Q4H, Doutova, Anastassia, MD, 2 Units at 11/11/19 0814 .  methylPREDNISolone sodium succinate (SOLU-MEDROL) 125 mg/2 mL injection 53.125 mg, 0.5 mg/kg, Intravenous, Q12H, Doutova, Anastassia, MD, 53.125 mg at 11/10/19 1728 .  ondansetron (ZOFRAN) tablet 4 mg, 4 mg, Oral, Q6H PRN **OR** ondansetron (ZOFRAN) injection 4 mg, 4 mg, Intravenous, Q6H PRN, Adela Glimpseoutova, Anastassia, MD .  [address][COMPLETED] remdesivir 200 mg in sodium chloride 0.9% 250 mL IVPB, 200 mg, Intravenous, Once, Stopped at 11/10/19 0228 **FOLLOWED BY** remdesivir 100 mg in sodium chloride  0.9 % 100 mL IVPB, 100 mg, Intravenous, Daily, Doutova, Anastassia, MD, Last Rate: 200 mL/hr at 11/11/19 0842, Rate Verify at 11/11/19 0842 .  sodium chloride flush (NS) 0.9 % injection 3 mL, 3 mL,  Intravenous, Q12H, Doutova, Anastassia, MD, 3 mL at 11/10/19 2252 .  zinc sulfate capsule 220 mg, 220 mg, Oral, Daily, Doutova, Anastassia, MD, 220 mg at 11/11/19 0815    ALLERGIES   Patient has no known allergies.    REVIEW OF SYSTEMS    Unable to obtain due to acute hypoxemic respiratory failure and acutely ill status  PHYSICAL EXAMINATION   Vital Signs: Temp:  [97.9 F (36.6 C)-98.6 F (37 C)] 98.5 F (36.9 C) (12/29 0800) Pulse Rate:  [58-81] 70 (12/29 0900) Resp:  [15-30] 28 (12/29 0900) BP: (101-143)/(37-97) 141/73 (12/29 0900) SpO2:  [84 %-100 %] 84 % (12/29 0900) FiO2 (%):  [100 %] 100 % (12/29 0400)  COVID-19 DISASTER DECLARATION:   FULL CONTACT PHYSICAL EXAMINATION WAS NOT POSSIBLE DUE TO TREATMENT OF COVID-19  AND CONSERVATION OF PERSONAL PROTECTIVE EQUIPMENT, LIMITED EXAM FINDINGS INCLUDE-  Physical Exam  Constitutional: He appears well-developed and well-nourished. He appears distressed.  HENT:  Head: Normocephalic and atraumatic.  Eyes: Conjunctivae and EOM are normal.  Neck: No tracheal deviation present.  Cardiovascular: Normal rate.  Abdominal: He exhibits no distension and no mass.  Musculoskeletal:     Cervical back: Neck supple.  Neurological: He is alert.  Skin: Skin is intact. He is diaphoretic.      Patient assessed or the symptoms described in the history of present illness.  In the context of the Global COVID-19 pandemic, which necessitated consideration that the patient might be at risk for infection with the SARS-CoV-2 virus that causes COVID-19, Institutional protocols and algorithms that pertain to the evaluation of patients at risk for COVID-19 are in a state of rapid change based on information released by regulatory bodies including the CDC and federal and state organizations. These policies and algorithms were followed during the patient's care while in hospital.   PERTINENT DATA     Infusions: . azithromycin 500 mg (11/10/19  2202)  . cefTRIAXone (ROCEPHIN)  IV 1 g (11/10/19 9528)  . heparin Stopped (11/11/19 0830)  . remdesivir 100 mg in NS 100 mL 200 mL/hr at 11/11/19 4132   Scheduled Medications: . vitamin C  500 mg Oral Daily  . Chlorhexidine Gluconate Cloth  6 each Topical Q0600  . insulin aspart  0-9 Units Subcutaneous Q4H  . methylPREDNISolone (SOLU-MEDROL) injection  0.5 mg/kg Intravenous Q12H  . sodium chloride flush  3 mL Intravenous Q12H  . zinc sulfate  220 mg Oral Daily   PRN Medications: acetaminophen, guaiFENesin-dextromethorphan, HYDROcodone-acetaminophen, ondansetron **OR** ondansetron (ZOFRAN) IV Hemodynamic parameters:   Intake/Output: 12/28 0701 - 12/29 0700 In: 610.5 [I.V.:160.5; IV Piggyback:450] Out: 4401 [Urine:1450]  Ventilator  Settings: FiO2 (%):  [100 %] 100 %     LAB RESULTS:  Basic Metabolic Panel: Recent Labs  Lab 11/05/19 1217 11/09/19 2154 11/10/19 0755  NA 134* 139 140  K 3.8 4.1 4.6  CL 98 102 105  CO2 24 18* 22  GLUCOSE 124* 195* 158*  BUN 17 30* 37*  CREATININE 1.08 1.61* 1.37*  CALCIUM 8.5* 8.2* 7.8*  MG  --   --  3.0*  PHOS  --   --  4.3   Liver Function Tests: Recent Labs  Lab 11/09/19 2154 11/10/19 0755  AST 128* 132*  ALT 72* 77*  ALKPHOS 81  78  BILITOT 0.8 0.8  PROT 8.5* 7.8  ALBUMIN 3.2* 3.0*   No results for input(s): LIPASE, AMYLASE in the last 168 hours. No results for input(s): AMMONIA in the last 168 hours. CBC: Recent Labs  Lab 11/05/19 1217 11/09/19 2154 11/10/19 0755 11/11/19 0656  WBC 5.4 14.1* 12.4* 13.9*  NEUTROABS  --  11.2* 10.2* 12.2*  HGB 13.3 14.6 13.2 13.5  HCT 40.6 45.0 40.8 42.1  MCV 79.5* 78.9* 78.9* 80.5  PLT 161 296 241 257   Cardiac Enzymes: No results for input(s): CKTOTAL, CKMB, CKMBINDEX, TROPONINI in the last 168 hours. BNP: Invalid input(s): POCBNP CBG: Recent Labs  Lab 11/10/19 1132 11/10/19 2217 11/11/19 0147 11/11/19 0521 11/11/19 0752  GLUCAP 136* 154* 155* 146* 161*      IMAGING RESULTS:  Imaging: DG Chest Port 1 View  Result Date: 11/09/2019 CLINICAL DATA:  Shortness of breath, difficulty breathing for 1 week, chills, nausea vomiting and dry cough, COVID-19 positive EXAM: PORTABLE CHEST 1 VIEW COMPARISON:  Radiograph 11/05/2019 FINDINGS: Markedly worsening bilateral is airspace opacities with indistinct pulmonary vascularity and trace bilateral effusions. Prominence of the cardiomediastinal silhouette, possibly accentuated by the portable technique. No acute osseous or soft tissue abnormality. Degenerative changes are present in the imaged spine and shoulders. IMPRESSION: Multifocal interstitial and airspace opacities throughout the lungs most compatible with infectious pneumonia/pneumonitis in the setting of COVID-19 positivity. Some underlying edema may be present as well. Electronically Signed   By: Kreg Shropshire M.D.   On: 11/09/2019 22:38      ASSESSMENT AND PLAN    -Multidisciplinary rounds held today  Acute Hypoxic Respiratory Failure  - due to COVID19 pneumonia -continue self proning - currently on NRB 15L -plan for CT chest /PE protocol  -continue Bronchodilator Therapy -continue full COVID19 therapy -steroids/remdesevir/BPH/fluid balance  Acute kidney injury - KDIGO stage 1  - preenal due to dehydration   -now resolved.    Transaminitis  - in context of active remdesevir infusion - acceptable range for now    Mild protein calorie malnutrition -upgrading diet to diabetic soft today   ID -continue IV abx as prescibed -follow up cultures  GI/Nutrition GI PROPHYLAXIS as indicated DIET-->TF's as tolerated Constipation protocol as indicated  ENDO - ICU hypoglycemic\Hyperglycemia protocol -check FSBS per protocol   ELECTROLYTES -follow labs as needed -replace as needed -pharmacy consultation   DVT/GI PRX ordered -SCDs  TRANSFUSIONS AS NEEDED MONITOR FSBS ASSESS the need for LABS as needed   Critical care  provider statement:    Critical care time (minutes):  33   Critical care time was exclusive of:  Separately billable procedures and treating other patients   Critical care was necessary to treat or prevent imminent or life-threatening deterioration of the following conditions:  acute pneumonitis, covid 19 infection, multiple comorbid conditions.    Critical care was time spent personally by me on the following activities:  Development of treatment plan with patient or surrogate, discussions with consultants, evaluation of patient's response to treatment, examination of patient, obtaining history from patient or surrogate, ordering and performing treatments and interventions, ordering and review of laboratory studies and re-evaluation of patient's condition.  I assumed direction of critical care for this patient from another provider in my specialty: no    This document was prepared using Dragon voice recognition software and may include unintentional dictation errors.    Vida Rigger, M.D.  Division of Pulmonary & Critical Care Medicine  [facility]

## 2019-11-12 DIAGNOSIS — I10 Essential (primary) hypertension: Secondary | ICD-10-CM

## 2019-11-12 DIAGNOSIS — I248 Other forms of acute ischemic heart disease: Secondary | ICD-10-CM | POA: Diagnosis present

## 2019-11-12 DIAGNOSIS — I2699 Other pulmonary embolism without acute cor pulmonale: Secondary | ICD-10-CM

## 2019-11-12 DIAGNOSIS — E1142 Type 2 diabetes mellitus with diabetic polyneuropathy: Secondary | ICD-10-CM

## 2019-11-12 DIAGNOSIS — E1165 Type 2 diabetes mellitus with hyperglycemia: Secondary | ICD-10-CM

## 2019-11-12 DIAGNOSIS — J1289 Other viral pneumonia: Secondary | ICD-10-CM

## 2019-11-12 DIAGNOSIS — IMO0002 Reserved for concepts with insufficient information to code with codable children: Secondary | ICD-10-CM | POA: Diagnosis present

## 2019-11-12 HISTORY — DX: Other pulmonary embolism without acute cor pulmonale: I26.99

## 2019-11-12 LAB — CBC WITH DIFFERENTIAL/PLATELET
Abs Immature Granulocytes: 0.36 10*3/uL — ABNORMAL HIGH (ref 0.00–0.07)
Basophils Absolute: 0 10*3/uL (ref 0.0–0.1)
Basophils Relative: 0 %
Eosinophils Absolute: 0 10*3/uL (ref 0.0–0.5)
Eosinophils Relative: 0 %
HCT: 42.2 % (ref 39.0–52.0)
Hemoglobin: 13.3 g/dL (ref 13.0–17.0)
Immature Granulocytes: 2 %
Lymphocytes Relative: 9 %
Lymphs Abs: 1.5 10*3/uL (ref 0.7–4.0)
MCH: 25.6 pg — ABNORMAL LOW (ref 26.0–34.0)
MCHC: 31.5 g/dL (ref 30.0–36.0)
MCV: 81.3 fL (ref 80.0–100.0)
Monocytes Absolute: 0.9 10*3/uL (ref 0.1–1.0)
Monocytes Relative: 5 %
Neutro Abs: 14 10*3/uL — ABNORMAL HIGH (ref 1.7–7.7)
Neutrophils Relative %: 84 %
Platelets: 250 10*3/uL (ref 150–400)
RBC: 5.19 MIL/uL (ref 4.22–5.81)
RDW: 14.6 % (ref 11.5–15.5)
WBC: 16.8 10*3/uL — ABNORMAL HIGH (ref 4.0–10.5)
nRBC: 0.8 % — ABNORMAL HIGH (ref 0.0–0.2)

## 2019-11-12 LAB — COMPREHENSIVE METABOLIC PANEL
ALT: 84 U/L — ABNORMAL HIGH (ref 0–44)
AST: 97 U/L — ABNORMAL HIGH (ref 15–41)
Albumin: 2.9 g/dL — ABNORMAL LOW (ref 3.5–5.0)
Alkaline Phosphatase: 129 U/L — ABNORMAL HIGH (ref 38–126)
Anion gap: 11 (ref 5–15)
BUN: 38 mg/dL — ABNORMAL HIGH (ref 6–20)
CO2: 25 mmol/L (ref 22–32)
Calcium: 7.9 mg/dL — ABNORMAL LOW (ref 8.9–10.3)
Chloride: 103 mmol/L (ref 98–111)
Creatinine, Ser: 1.23 mg/dL (ref 0.61–1.24)
GFR calc Af Amer: >60 mL/min (ref >60)
GFR calc non Af Amer: >60 mL/min (ref >60)
Glucose, Bld: 139 mg/dL — ABNORMAL HIGH (ref 70–99)
Potassium: 4.5 mmol/L (ref 3.5–5.1)
Sodium: 139 mmol/L (ref 135–145)
Total Bilirubin: 1.1 mg/dL (ref 0.3–1.2)
Total Protein: 7.5 g/dL (ref 6.5–8.1)

## 2019-11-12 LAB — PROCALCITONIN: Procalcitonin: 0.45 ng/mL

## 2019-11-12 LAB — D-DIMER, QUANTITATIVE: D-Dimer, Quant: >20 ug/mL-FEU — ABNORMAL HIGH (ref 0.00–0.50)

## 2019-11-12 LAB — GLUCOSE, CAPILLARY
Glucose-Capillary: 132 mg/dL — ABNORMAL HIGH (ref 70–99)
Glucose-Capillary: 181 mg/dL — ABNORMAL HIGH (ref 70–99)
Glucose-Capillary: 183 mg/dL — ABNORMAL HIGH (ref 70–99)
Glucose-Capillary: 204 mg/dL — ABNORMAL HIGH (ref 70–99)

## 2019-11-12 LAB — PHOSPHORUS: Phosphorus: 3.7 mg/dL (ref 2.5–4.6)

## 2019-11-12 LAB — MAGNESIUM: Magnesium: 3.5 mg/dL — ABNORMAL HIGH (ref 1.7–2.4)

## 2019-11-12 LAB — C-REACTIVE PROTEIN: CRP: 4.7 mg/dL — ABNORMAL HIGH (ref <1.0)

## 2019-11-12 LAB — FERRITIN: Ferritin: 531 ng/mL — ABNORMAL HIGH (ref 24–336)

## 2019-11-12 NOTE — Plan of Care (Signed)
  Problem: Education: Goal: Knowledge of risk factors and measures for prevention of condition will improve Outcome: Progressing   Problem: Coping: Goal: Psychosocial and spiritual needs will be supported Outcome: Progressing   Problem: Respiratory: Goal: Will maintain a patent airway Outcome: Progressing Goal: Complications related to the disease process, condition or treatment will be avoided or minimized Outcome: Progressing   

## 2019-11-12 NOTE — Progress Notes (Signed)
PROGRESS NOTE    Nathaniel Webb  WJX:[id number] DOB: 09/13/1966 DOA: 11/11/2019 PCP: Rolm GalaIloabachie, Chioma E, NP   Brief Narrative:  53 year old BM PMHx diabetes type 2 uncontrolled with complication/diabetic neuropathy,, HTN, HLD, obesity  Presented to [facility] with approximately 1 week history of gradually worsening shortness of breath.  He was found to have acute hypoxic respiratory failure and was requiring high flow oxygen to maintain O2 saturations.  Chest x-ray was typical of Covid pneumonitis.    Patient stated had a URI symptoms, weakness that started before his shortness of breath.  He presented to the ED on 12/23 and was diagnosed with COVID-19-but was discharged home.    Subjective: A/O x4, negative CP, negative abdominal pain, positive S OB    Assessment & Plan:   Principal Problem:   Acute respiratory failure with hypoxemia (HCC) Active Problems:   Type 2 diabetes, controlled, with peripheral neuropathy (HCC)   Hypertension   Pneumonia due to COVID-19 virus   Diabetes mellitus type 2, uncontrolled, with complications (HCC)   Acute pulmonary embolus (HCC)   Demand ischemia (HCC)   Covid pneumonia/acute respiratory failure with hypoxia  COVID-19 Labs  Recent Labs    11/09/19 2154 11/10/19 0755 11/11/19 0656 11/11/19 1252 11/12/19 0130  DDIMER  --   --   --  >20.00* >20.00*  FERRITIN 879* 663* 558*  --  531*  LDH 1,089*  --   --   --   --   CRP 21.8*  --  9.9* 8.6* 4.7*    No results found for: SARSCOV2NAA -Solu-Medrol 53 mg BID -Remdesivir per pharmacy protocol -[facility]emra [facility] at [facility] -Combivent -Flutter valve -Incentive spirometry -Titrate O2 to maintain SPO2> 88% -Prone patient for 16 hours/day; if patient cannot tolerate prone 2 to 3 hours per shift  Acute PE -CTA chest PE protocol positive for PE see results below -Currently on full dose Lovenox -Bilateral lower extremity Dopplers negative for DVT  Essential HTN -Currently  controlled  Diabetes type 2 uncontrolled with complication -12/28 hemoglobin A1c= 7.3 -Resistant SSI  Mild transaminitis -Most likely secondary to Covid, remdesivir, and Actemra monitor closely.    Minimally elevated troponin level: Demand ischemia -Downtrending-EKG reassuring-likely demand ischemia.  Doubt any further work-up is required.    DVT prophylaxis: Full dose Lovenox Code Status: Full Family Communication:  Disposition Plan:    Consultants:    Procedures/Significant Events:  12/29 CTA chest PE protocol; positive PE small volume of somewhat eccentric appearing embolus present in the distal right pulmonary artery, right-sided lobar lobar, and some proximal segmental branches. No left-sided embolus noted. -Extensive ground-glass and heterogeneous airspace opacity throughout the lungs bilaterally, most conspicuous in the dependent left lung. -CAD- Hepatic steatosis. 12/29 bilateral lower extremity Doppler negative for DVT   I have personally reviewed and interpreted all radiology studies and my findings are as above.  VENTILATOR SETTINGS:    Cultures   Antimicrobials: Anti-infectives (From admission, onward)   Start     Dose/Rate Stop   11/12/19 1000  remdesivir 100 mg in sodium chloride 0.9 % 100 mL IVPB  Status:  Discontinued     100 mg 200 mL/hr over 30 Minutes 11/11/19 1238   11/12/19 1000  remdesivir 100 mg in sodium chloride 0.9 % 100 mL IVPB     100 mg 200 mL/hr over 30 Minutes 11/15/19 0959   11/11/19 1330  cefTRIAXone (ROCEPHIN) 1 g in sodium chloride 0.9 % 100 mL IVPB     1 g 200 mL/hr over  30 Minutes 11/16/19 1314   11/11/19 1330  azithromycin (ZITHROMAX) 500 mg in sodium chloride 0.9 % 250 mL IVPB     500 mg 250 mL/hr over 60 Minutes 11/14/19 1329   11/11/19 1230  remdesivir 200 mg in sodium chloride 0.9% 250 mL IVPB  Status:  Discontinued     200 mg 580 mL/hr over 30 Minutes 11/11/19 1238       Devices    LINES / TUBES:       Continuous Infusions: . sodium chloride    . azithromycin Stopped (11/12/19 1545)  . cefTRIAXone (ROCEPHIN)  IV Stopped (11/12/19 1300)  . remdesivir 100 mg in NS 100 mL Stopped (11/12/19 0930)     Objective: Vitals:   11/12/19 1100 11/12/19 1200 11/12/19 1230 11/12/19 1635  BP: 128/61   (!) 147/89  Pulse: 73  75 79  Resp: (!) 27 20 (!) 22 (!) 23  Temp:    97.9 F (36.6 C)  TempSrc:    Oral  SpO2: 93% 94% 93% 95%  Weight:      Height:        Intake/Output Summary (Last 24 hours) at 11/12/2019 1751 Last data filed at 11/12/2019 1600 Gross per 24 hour  Intake 2273 ml  Output 1700 ml  Net 573 ml   Filed Weights   11/11/19 1500  Weight: 106.1 kg    Examination:  General: A/O x4, positive acute respiratory distress Eyes: negative scleral hemorrhage, negative anisocoria, negative icterus ENT: Negative Runny nose, negative gingival bleeding, Neck:  Negative scars, masses, torticollis, lymphadenopathy, JVD Lungs: Clear to auscultation bilaterally without wheezes or crackles Cardiovascular: Regular rate and rhythm without murmur gallop or rub normal S1 and S2 Abdomen: negative abdominal pain, nondistended, positive soft, bowel sounds, no rebound, no ascites, no appreciable mass Extremities: No significant cyanosis, clubbing, or edema bilateral lower extremities Skin: Negative rashes, lesions, ulcers Psychiatric:  Negative depression, negative anxiety, negative fatigue, negative mania  Central nervous system:  Cranial nerves II through XII intact, tongue/uvula midline, all extremities muscle strength 5/5, sensation intact throughout, negative dysarthria, negative expressive aphasia, negative receptive aphasia.   .     Data Reviewed: Care during the described time interval was provided by me .  I have reviewed this patient's available data, including medical history, events of note, physical examination, and all test results as part of my evaluation.    CBC: Recent Labs  Lab 11/09/19 2154 11/10/19 0755 11/11/19 0656 11/12/19 0130  WBC 14.1* 12.4* 13.9* 16.8*  NEUTROABS 11.2* 10.2* 12.2* 14.0*  HGB 14.6 13.2 13.5 13.3  HCT 45.0 40.8 42.1 42.2  MCV 78.9* 78.9* 80.5 81.3  PLT 296 241 257 250   Basic Metabolic Panel: Recent Labs  Lab 11/09/19 2154 11/10/19 0755 11/11/19 0656 11/12/19 0130 11/12/19 0400  NA 139 140 141 139  --   K 4.1 4.6 4.7 4.5  --   CL 102 105 105 103  --   CO2 18* 22 23 25   --   GLUCOSE 195* 158* 171* 139*  --   BUN 30* 37* 44* 38*  --   CREATININE 1.61* 1.37* 1.21 1.23  --   CALCIUM 8.2* 7.8* 7.7* 7.9*  --   MG  --  3.0* 3.3*  --  3.5*  PHOS  --  4.3 4.2  --  3.7   GFR: Estimated Creatinine Clearance: 78.8 mL/min (by C-G formula based on SCr of 1.23 mg/dL). Liver Function Tests: Recent Labs  Lab 11/09/19 2154 11/10/19  6962 11/11/19 0656 11/12/19 0130  AST 128* 132* 109* 97*  ALT 72* 77* 85* 84*  ALKPHOS 81 78 92 129*  BILITOT 0.8 0.8 0.8 1.1  PROT 8.5* 7.8 7.5 7.5  ALBUMIN 3.2* 3.0* 2.7* 2.9*   No results for input(s): LIPASE, AMYLASE in the last 168 hours. No results for input(s): AMMONIA in the last 168 hours. Coagulation Profile: No results for input(s): INR, PROTIME in the last 168 hours. Cardiac Enzymes: No results for input(s): CKTOTAL, CKMB, CKMBINDEX, TROPONINI in the last 168 hours. BNP (last 3 results) No results for input(s): PROBNP in the last 8760 hours. HbA1C: Recent Labs    11/10/19 0755  HGBA1C 7.3*   CBG: Recent Labs  Lab 11/11/19 1602 11/11/19 2109 11/12/19 0809 11/12/19 1130 11/12/19 1548  GLUCAP 160* 177* 132* 181* 183*   Lipid Profile: Recent Labs    11/09/19 2154  TRIG 164*   Thyroid Function Tests: No results for input(s): TSH, T4TOTAL, FREET4, T3FREE, THYROIDAB in the last 72 hours. Anemia Panel: Recent Labs    11/11/19 0656 11/12/19 0130  FERRITIN 558* 531*   Urine analysis:    Component Value Date/Time   COLORURINE YELLOW (A)  07/08/2018 1113   APPEARANCEUR CLEAR (A) 07/08/2018 1113   LABSPEC 1.013 07/08/2018 1113   PHURINE 5.0 07/08/2018 1113   GLUCOSEU NEGATIVE 07/08/2018 1113   HGBUR NEGATIVE 07/08/2018 1113   BILIRUBINUR NEGATIVE 07/08/2018 1113   KETONESUR NEGATIVE 07/08/2018 1113   PROTEINUR NEGATIVE 07/08/2018 1113   NITRITE NEGATIVE 07/08/2018 1113   LEUKOCYTESUR NEGATIVE 07/08/2018 1113   Sepsis Labs: (procalcitonin:4,lacticidven:4)  ) Recent Results (from the past 240 hour(s))  Blood Culture (routine x 2)     Status: None (Preliminary result)   Collection Time: 11/09/19 10:51 PM   Specimen: BLOOD LEFT HAND  Result Value Ref Range Status   Specimen Description BLOOD LEFT HAND  Final   Special Requests   Final    BOTTLES DRAWN AEROBIC AND ANAEROBIC Blood Culture adequate volume   Culture   Final    NO GROWTH 2 DAYS Performed at [facility], [address] [place], [place] [address]    Report Status PENDING  Incomplete  Blood Culture (routine x 2)     Status: None (Preliminary result)   Collection Time: 11/09/19 11:03 PM   Specimen: BLOOD  Result Value Ref Range Status   Specimen Description BLOOD RIGHT ANTECUBITAL  Final   Special Requests   Final    BOTTLES DRAWN AEROBIC AND ANAEROBIC Blood Culture adequate volume   Culture   Final    NO GROWTH 2 DAYS Performed at [facility], [address] [place], [place] [address]    Report Status PENDING  Incomplete  MRSA PCR Screening     Status: None   Collection Time: 11/10/19  6:42 AM   Specimen: Nasal Mucosa; Nasopharyngeal  Result Value Ref Range Status   MRSA by PCR NEGATIVE NEGATIVE Final    Comment:        The GeneXpert MRSA Assay (FDA approved for NASAL specimens only), is one component of a comprehensive MRSA colonization surveillance program. It is not intended to diagnose MRSA infection nor to guide or monitor treatment for MRSA infections. Performed at [facility], [address] [place], [place] [address]   Culture, blood (Routine X 2) w Reflex to ID Panel     Status: None (Preliminary result)   Collection Time: 11/10/19  7:54 AM   Specimen: BLOOD  Result Value Ref Range Status   Specimen Description BLOOD BLOOD RIGHT FOREARM  Final   Special Requests   Final    BOTTLES DRAWN AEROBIC AND ANAEROBIC Blood Culture adequate volume   Culture   Final    NO GROWTH < 24 HOURS Performed at [facility], [address] [place], [place] [address]    Report Status PENDING  Incomplete  Culture, blood (Routine X 2) w Reflex to ID Panel     Status: None (Preliminary result)   Collection Time: 11/10/19  7:55 AM   Specimen: BLOOD  Result Value Ref Range Status   Specimen Description BLOOD BLOOD RIGHT HAND  Final   Special Requests   Final    BOTTLES DRAWN AEROBIC AND ANAEROBIC Blood Culture adequate volume   Culture   Final    NO GROWTH < 24 HOURS Performed at [facility], [address] [place], [place] [address]    Report Status PENDING  Incomplete         Radiology Studies: CT ANGIO CHEST PE W OR WO CONTRAST  Result Date: 11/11/2019 CLINICAL DATA:  Shortness of breath EXAM: CT ANGIOGRAPHY CHEST WITH CONTRAST TECHNIQUE: Multidetector CT imaging of the chest was performed using the standard protocol during bolus administration of intravenous contrast. Multiplanar CT image reconstructions and MIPs were obtained to evaluate the vascular anatomy. CONTRAST:  80mL OMNIPAQUE IOHEXOL 350 MG/ML SOLN COMPARISON:  Chest radiograph, 11/09/2019 FINDINGS: Cardiovascular: Satisfactory opacification of the pulmonary arteries to the segmental level. Positive examination for pulmonary embolism with a small volume of somewhat eccentric appearing embolus present in the distal right pulmonary artery, lobar, and some segmental branches (series 4, image 80). Normal heart size. Left coronary artery calcifications. No pericardial effusion. Mediastinum/Nodes: No  enlarged mediastinal, hilar, or axillary lymph nodes. Thyroid gland, trachea, and esophagus demonstrate no significant findings. Lungs/Pleura: Extensive ground-glass and heterogeneous airspace opacity throughout the lungs bilaterally, most conspicuous in the dependent left lung. Trace bilateral pleural effusions. No pleural effusion or pneumothorax. Upper Abdomen: Hepatic steatosis. Musculoskeletal: No chest wall abnormality. No acute or significant osseous findings. Review of the MIP images confirms the above findings. IMPRESSION: 1. Positive examination for pulmonary embolism with a small volume of somewhat eccentric appearing embolus present in the distal right pulmonary artery, right-sided lobar lobar, and some proximal segmental branches. No left-sided embolus noted. 2. Extensive ground-glass and heterogeneous airspace opacity throughout the lungs bilaterally, most conspicuous in the dependent left lung. Findings are consistent with COVID-19 airspace disease. 3. Trace bilateral pleural effusions. 4. Coronary artery disease.  Aortic Atherosclerosis (ICD10-I70.0). 5. Hepatic steatosis. These results were called by telephone at the time of interpretation on 11/11/2019 at 5:25 pm to Dr. Jeoffrey Massed , who verbally acknowledged these results. Electronically Signed   By: Lauralyn Primes M.D.   On: 11/11/2019 17:25   VAS [place] LOWER EXTREMITY VENOUS (DVT)  Result Date: 11/11/2019  Lower Venous Study Indications: Covid-19, elevated D-dimer.  Comparison Study: No prior study on file Performing Technologist: Sherren Kerns RVS  Examination Guidelines: A complete evaluation includes B-mode imaging, spectral Doppler, color Doppler, and power Doppler as needed of all accessible portions of each vessel. Bilateral testing is considered an integral part of a complete examination. Limited examinations for reoccurring indications may be performed as noted.   +---------+---------------+---------+-----------+----------+--------------+ RIGHT    CompressibilityPhasicitySpontaneityPropertiesThrombus Aging +---------+---------------+---------+-----------+----------+--------------+ CFV      Full           Yes      Yes                                 +---------+---------------+---------+-----------+----------+--------------+  SFJ      Full                                                        +---------+---------------+---------+-----------+----------+--------------+ FV Prox  Full                                                        +---------+---------------+---------+-----------+----------+--------------+ FV Mid   Full                                                        +---------+---------------+---------+-----------+----------+--------------+ FV DistalFull                                                        +---------+---------------+---------+-----------+----------+--------------+ PFV      Full                                                        +---------+---------------+---------+-----------+----------+--------------+ POP      Full           Yes      Yes                                 +---------+---------------+---------+-----------+----------+--------------+ PTV      Full                                                        +---------+---------------+---------+-----------+----------+--------------+ PERO     Full                                                        +---------+---------------+---------+-----------+----------+--------------+   +---------+---------------+---------+-----------+----------+--------------+ LEFT     CompressibilityPhasicitySpontaneityPropertiesThrombus Aging +---------+---------------+---------+-----------+----------+--------------+ CFV      Full           Yes      Yes                                  +---------+---------------+---------+-----------+----------+--------------+ SFJ      Full                                                        +---------+---------------+---------+-----------+----------+--------------+  FV Prox  Full                                                        +---------+---------------+---------+-----------+----------+--------------+ FV Mid   Full                                                        +---------+---------------+---------+-----------+----------+--------------+ FV DistalFull                                                        +---------+---------------+---------+-----------+----------+--------------+ PFV      Full                                                        +---------+---------------+---------+-----------+----------+--------------+ POP      Full           Yes      Yes                                 +---------+---------------+---------+-----------+----------+--------------+ PTV      Full                                                        +---------+---------------+---------+-----------+----------+--------------+ PERO     Full                                                        +---------+---------------+---------+-----------+----------+--------------+     Summary: Right: There is no evidence of deep vein thrombosis in the lower extremity. Left: There is no evidence of deep vein thrombosis in the lower extremity.  *See table(s) above for measurements and observations. Electronically signed by Servando Snare MD on 11/11/2019 at 3:46:35 PM.    Final         Scheduled Meds: . vitamin C  500 mg Oral Daily  . enoxaparin (LOVENOX) injection  1 mg/kg Subcutaneous Q12H  . insulin aspart  0-20 Units Subcutaneous TID WC  . methylPREDNISolone (SOLU-MEDROL) injection  0.5 mg/kg Intravenous Q12H  . sodium chloride flush  3 mL Intravenous Q12H  . zinc sulfate  220 mg Oral Daily   Continuous  Infusions: . sodium chloride    . azithromycin Stopped (11/12/19 1545)  . cefTRIAXone (ROCEPHIN)  IV Stopped (11/12/19 1300)  . remdesivir 100 mg in NS 100 mL Stopped (11/12/19 0930)     LOS: 1 day   The patient is critically ill with multiple organ systems  failure and requires high complexity decision making for assessment and support, frequent evaluation and titration of therapies, application of advanced monitoring technologies and extensive interpretation of multiple databases. Critical Care Time devoted to patient care services described in this note  Time spent: 40 minutes     Larua Collier, Roselind Messier, MD [facility] Pager [phone]  If 7PM-7AM, please contact night-coverage [email or web address] Password [email or web address] 11/12/2019, 5:51 PM

## 2019-11-13 LAB — CBC WITH DIFFERENTIAL/PLATELET
Abs Immature Granulocytes: 0.43 10*3/uL — ABNORMAL HIGH (ref 0.00–0.07)
Basophils Absolute: 0.1 10*3/uL (ref 0.0–0.1)
Basophils Relative: 0 %
Eosinophils Absolute: 0 10*3/uL (ref 0.0–0.5)
Eosinophils Relative: 0 %
HCT: 43.8 % (ref 39.0–52.0)
Hemoglobin: 14.1 g/dL (ref 13.0–17.0)
Immature Granulocytes: 3 %
Lymphocytes Relative: 14 %
Lymphs Abs: 1.9 10*3/uL (ref 0.7–4.0)
MCH: 26.4 pg (ref 26.0–34.0)
MCHC: 32.2 g/dL (ref 30.0–36.0)
MCV: 81.9 fL (ref 80.0–100.0)
Monocytes Absolute: 0.7 10*3/uL (ref 0.1–1.0)
Monocytes Relative: 5 %
Neutro Abs: 10.6 10*3/uL — ABNORMAL HIGH (ref 1.7–7.7)
Neutrophils Relative %: 78 %
Platelets: 241 10*3/uL (ref 150–400)
RBC: 5.35 MIL/uL (ref 4.22–5.81)
RDW: 14.6 % (ref 11.5–15.5)
WBC: 13.7 10*3/uL — ABNORMAL HIGH (ref 4.0–10.5)
nRBC: 1 % — ABNORMAL HIGH (ref 0.0–0.2)

## 2019-11-13 LAB — COMPREHENSIVE METABOLIC PANEL
ALT: 90 U/L — ABNORMAL HIGH (ref 0–44)
AST: 102 U/L — ABNORMAL HIGH (ref 15–41)
Albumin: 3.2 g/dL — ABNORMAL LOW (ref 3.5–5.0)
Alkaline Phosphatase: 161 U/L — ABNORMAL HIGH (ref 38–126)
Anion gap: 16 — ABNORMAL HIGH (ref 5–15)
BUN: 29 mg/dL — ABNORMAL HIGH (ref 6–20)
CO2: 22 mmol/L (ref 22–32)
Calcium: 8 mg/dL — ABNORMAL LOW (ref 8.9–10.3)
Chloride: 102 mmol/L (ref 98–111)
Creatinine, Ser: 1.12 mg/dL (ref 0.61–1.24)
GFR calc Af Amer: >60 mL/min (ref >60)
GFR calc non Af Amer: >60 mL/min (ref >60)
Glucose, Bld: 137 mg/dL — ABNORMAL HIGH (ref 70–99)
Potassium: 4.9 mmol/L (ref 3.5–5.1)
Sodium: 140 mmol/L (ref 135–145)
Total Bilirubin: 0.9 mg/dL (ref 0.3–1.2)
Total Protein: 7.4 g/dL (ref 6.5–8.1)

## 2019-11-13 LAB — FERRITIN: Ferritin: 460 ng/mL — ABNORMAL HIGH (ref 24–336)

## 2019-11-13 LAB — GLUCOSE, CAPILLARY
Glucose-Capillary: 165 mg/dL — ABNORMAL HIGH (ref 70–99)
Glucose-Capillary: 140 mg/dL — ABNORMAL HIGH (ref 70–99)
Glucose-Capillary: 146 mg/dL — ABNORMAL HIGH (ref 70–99)
Glucose-Capillary: 179 mg/dL — ABNORMAL HIGH (ref 70–99)

## 2019-11-13 LAB — D-DIMER, QUANTITATIVE: D-Dimer, Quant: 15.01 ug/mL-FEU — ABNORMAL HIGH (ref 0.00–0.50)

## 2019-11-13 LAB — C-REACTIVE PROTEIN: CRP: 2.2 mg/dL — ABNORMAL HIGH (ref <1.0)

## 2019-11-13 LAB — MAGNESIUM: Magnesium: 3.3 mg/dL — ABNORMAL HIGH (ref 1.7–2.4)

## 2019-11-13 LAB — PROCALCITONIN: Procalcitonin: 0.53 ng/mL

## 2019-11-13 LAB — PHOSPHORUS: Phosphorus: 3.5 mg/dL (ref 2.5–4.6)

## 2019-11-13 MED ORDER — INFLUENZA VAC SPLIT QUAD 0.5 ML IM SUSY
0.5000 mL | PREFILLED_SYRINGE | INTRAMUSCULAR | Status: DC | PRN
  Filled 2019-11-13: qty 0.5

## 2019-11-13 NOTE — Plan of Care (Signed)
  Problem: Education: Goal: Knowledge of risk factors and measures for prevention of condition will improve Outcome: Progressing   Problem: Coping: Goal: Psychosocial and spiritual needs will be supported Outcome: Progressing   Problem: Respiratory: Goal: Will maintain a patent airway Outcome: Progressing Goal: Complications related to the disease process, condition or treatment will be avoided or minimized Outcome: Progressing   

## 2019-11-13 NOTE — Progress Notes (Addendum)
PROGRESS NOTE    Nathaniel MagesJohnny R Webb  YQM:[id number] DOB: 10/10/1966 DOA: 11/11/2019 PCP: Rolm GalaIloabachie, Chioma E, NP   Brief Narrative:  53 year old BM PMHx diabetes type 2 uncontrolled with complication/diabetic neuropathy,, HTN, HLD, obesity  [facility] with approximately 1 week history of gradually worsening shortness of breath.  He was found to have acute hypoxic respiratory failure and was requiring high flow oxygen to maintain O2 saturations.  Chest x-ray was typical of Covid pneumonitis.    Patient stated had a URI symptoms, weakness that started before his shortness of breath.  He presented to the ED on 12/23 and was diagnosed with COVID-19-but was discharged home.    Subjective: 12/31 last 24 hours afebrile.  A/O x4 - abdominal pain, negative CP, positive S OB     Assessment & Plan:   Principal Problem:   Acute respiratory failure with hypoxemia (HCC) Active Problems:   Type 2 diabetes, controlled, with peripheral neuropathy (HCC)   Hypertension   Pneumonia due to COVID-19 virus   Diabetes mellitus type 2, uncontrolled, with complications (HCC)   Acute pulmonary embolus (HCC)   Demand ischemia (HCC)   Covid pneumonia/acute respiratory failure with hypoxia  COVID-19 Labs  Recent Labs    11/11/19 0656 11/11/19 1252 11/12/19 0130 11/13/19 0120  DDIMER  --  >20.00* >20.00* 15.01*  FERRITIN 558*  --  531* 460*  CRP 9.9* 8.6* 4.7* 2.2*    No results found for: SARSCOV2NAA -Solu-Medrol 53 mg BID -Remdesivir per pharmacy protocol -[facility] -Combivent -Flutter valve -Incentive spirometry -Titrate O2 to maintain SPO2> 88% -Prone patient for 16 hours/day; if patient cannot tolerate prone 2 to 3 hours per shift  Acute PE -CTA chest PE protocol positive for PE see results below -Currently on full dose Lovenox -Bilateral lower extremity Dopplers negative for DVT -On 1/1 will work with NCM to determine best medication to place patient on in  relation to his insurance.  Essential HTN -Currently controlled  Diabetes type 2 uncontrolled with complication -12/28 hemoglobin A1c= 7.3 -Resistant SSI  Mild transaminitis -Most likely secondary to Covid, remdesivir, and Actemra monitor closely.    Minimally elevated troponin level: Demand ischemia -Downtrending-EKG reassuring-likely demand ischemia.  Doubt any further work-up is required.    DVT prophylaxis: Full dose Lovenox Code Status: Full Family Communication: 12/31 spoke with Margaretha GlassingLoretta (wife) counseled her on plan of care answered all questions Disposition Plan:    Consultants:    Procedures/Significant Events:  12/29 CTA chest PE protocol; positive PE small volume of somewhat eccentric appearing embolus present in the distal right pulmonary artery, right-sided lobar lobar, and some proximal segmental branches. No left-sided embolus noted. -Extensive ground-glass and heterogeneous airspace opacity throughout the lungs bilaterally, most conspicuous in the dependent left lung. -CAD- Hepatic steatosis. 12/29 bilateral lower extremity Doppler negative for DVT   I have personally reviewed and interpreted all radiology studies and my findings are as above.  VENTILATOR SETTINGS: NRB 12/31 Flow; 15 L/min SPO2 94%    Cultures   Antimicrobials: Anti-infectives (From admission, onward)   Start     Dose/Rate Stop   11/12/19 1000  remdesivir 100 mg in sodium chloride 0.9 % 100 mL IVPB  Status:  Discontinued     100 mg 200 mL/hr over 30 Minutes 11/11/19 1238   11/12/19 1000  remdesivir 100 mg in sodium chloride 0.9 % 100 mL IVPB     100 mg 200 mL/hr over 30 Minutes 11/15/19 0959   11/11/19 1330  cefTRIAXone (  ROCEPHIN) 1 g in sodium chloride 0.9 % 100 mL IVPB     1 g 200 mL/hr over 30 Minutes 11/16/19 1314   11/11/19 1330  azithromycin (ZITHROMAX) 500 mg in sodium chloride 0.9 % 250 mL IVPB     500 mg 250 mL/hr over 60 Minutes 11/14/19 1329   11/11/19 1230   remdesivir 200 mg in sodium chloride 0.9% 250 mL IVPB  Status:  Discontinued     200 mg 580 mL/hr over 30 Minutes 11/11/19 1238       Devices    LINES / TUBES:      Continuous Infusions: . sodium chloride    . azithromycin Stopped (11/12/19 1545)  . cefTRIAXone (ROCEPHIN)  IV Stopped (11/12/19 1300)  . remdesivir 100 mg in NS 100 mL 100 mg (11/13/19 0844)     Objective: Vitals:   11/12/19 2004 11/13/19 0037 11/13/19 0420 11/13/19 0735  BP:  124/73 134/85   Pulse:   67 65  Resp:  (!) 24 (!) 24 20  Temp: 97.8 F (36.6 C) 97.7 F (36.5 C) 98.7 F (37.1 C)   TempSrc: Oral Oral Oral   SpO2:   93% 91%  Weight:      Height:        Intake/Output Summary (Last 24 hours) at 11/13/2019 1309 Last data filed at 11/13/2019 9024 Gross per 24 hour  Intake 1277.43 ml  Output 1200 ml  Net 77.43 ml   Filed Weights   11/11/19 1500  Weight: 106.1 kg   Physical Exam:  General: A/O x4, positive acute respiratory distress Eyes: negative scleral hemorrhage, negative anisocoria, negative icterus ENT: Negative Runny nose, negative gingival bleeding, Neck:  Negative scars, masses, torticollis, lymphadenopathy, JVD Lungs: Clear to auscultation bilaterally without wheezes or crackles Cardiovascular: Regular rate and rhythm without murmur gallop or rub normal S1 and S2 Abdomen: negative abdominal pain, nondistended, positive soft, bowel sounds, no rebound, no ascites, no appreciable mass Extremities: No significant cyanosis, clubbing, or edema bilateral lower extremities Skin: Negative rashes, lesions, ulcers Psychiatric:  Negative depression, negative anxiety, negative fatigue, negative mania  Central nervous system:  Cranial nerves II through XII intact, tongue/uvula midline, all extremities muscle strength 5/5, sensation intact throughout, negative dysarthria, negative expressive aphasia, negative receptive aphasia.    .     Data Reviewed: Care during the described time  interval was provided by me .  I have reviewed this patient's available data, including medical history, events of note, physical examination, and all test results as part of my evaluation.   CBC: Recent Labs  Lab 11/09/19 2154 11/10/19 0755 11/11/19 0656 11/12/19 0130 11/13/19 0120  WBC 14.1* 12.4* 13.9* 16.8* 13.7*  NEUTROABS 11.2* 10.2* 12.2* 14.0* 10.6*  HGB 14.6 13.2 13.5 13.3 14.1  HCT 45.0 40.8 42.1 42.2 43.8  MCV 78.9* 78.9* 80.5 81.3 81.9  PLT 296 241 257 250 097   Basic Metabolic Panel: Recent Labs  Lab 11/09/19 2154 11/10/19 0755 11/11/19 0656 11/12/19 0130 11/12/19 0400 11/13/19 0120  NA 139 140 141 139  --  140  K 4.1 4.6 4.7 4.5  --  4.9  CL 102 105 105 103  --  102  CO2 18* 22 23 25   --  22  GLUCOSE 195* 158* 171* 139*  --  137*  BUN 30* 37* 44* 38*  --  29*  CREATININE 1.61* 1.37* 1.21 1.23  --  1.12  CALCIUM 8.2* 7.8* 7.7* 7.9*  --  8.0*  MG  --  3.0* 3.3*  --  3.5* 3.3*  PHOS  --  4.3 4.2  --  3.7 3.5   GFR: Estimated Creatinine Clearance: 86.5 mL/min (by C-G formula based on SCr of 1.12 mg/dL). Liver Function Tests: Recent Labs  Lab 11/09/19 2154 11/10/19 0755 11/11/19 0656 11/12/19 0130 11/13/19 0120  AST 128* 132* 109* 97* 102*  ALT 72* 77* 85* 84* 90*  ALKPHOS 81 78 92 129* 161*  BILITOT 0.8 0.8 0.8 1.1 0.9  PROT 8.5* 7.8 7.5 7.5 7.4  ALBUMIN 3.2* 3.0* 2.7* 2.9* 3.2*   No results for input(s): LIPASE, AMYLASE in the last 168 hours. No results for input(s): AMMONIA in the last 168 hours. Coagulation Profile: No results for input(s): INR, PROTIME in the last 168 hours. Cardiac Enzymes: No results for input(s): CKTOTAL, CKMB, CKMBINDEX, TROPONINI in the last 168 hours. BNP (last 3 results) No results for input(s): PROBNP in the last 8760 hours. HbA1C: No results for input(s): HGBA1C in the last 72 hours. CBG: Recent Labs  Lab 11/12/19 1130 11/12/19 1548 11/12/19 2127 11/13/19 0846 11/13/19 1159  GLUCAP 181* 183* 204* 165* 140*    Lipid Profile: No results for input(s): CHOL, HDL, LDLCALC, TRIG, CHOLHDL, LDLDIRECT in the last 72 hours. Thyroid Function Tests: No results for input(s): TSH, T4TOTAL, FREET4, T3FREE, THYROIDAB in the last 72 hours. Anemia Panel: Recent Labs    11/12/19 0130 11/13/19 0120  FERRITIN 531* 460*   Urine analysis:    Component Value Date/Time   COLORURINE YELLOW (A) 07/08/2018 1113   APPEARANCEUR CLEAR (A) 07/08/2018 1113   LABSPEC 1.013 07/08/2018 1113   PHURINE 5.0 07/08/2018 1113   GLUCOSEU NEGATIVE 07/08/2018 1113   HGBUR NEGATIVE 07/08/2018 1113   BILIRUBINUR NEGATIVE 07/08/2018 1113   KETONESUR NEGATIVE 07/08/2018 1113   PROTEINUR NEGATIVE 07/08/2018 1113   NITRITE NEGATIVE 07/08/2018 1113   LEUKOCYTESUR NEGATIVE 07/08/2018 1113   Sepsis Labs: (procalcitonin:4,lacticidven:4)  ) Recent Results (from the past 240 hour(s))  Blood Culture (routine x 2)     Status: None (Preliminary result)   Collection Time: 11/09/19 10:51 PM   Specimen: BLOOD LEFT HAND  Result Value Ref Range Status   Specimen Description BLOOD LEFT HAND  Final   Special Requests   Final    BOTTLES DRAWN AEROBIC AND ANAEROBIC Blood Culture adequate volume   Culture   Final    NO GROWTH 4 DAYS Performed at [facility], [address], [place], [place] [address]    Report Status PENDING  Incomplete  Blood Culture (routine x 2)     Status: None (Preliminary result)   Collection Time: 11/09/19 11:03 PM   Specimen: BLOOD  Result Value Ref Range Status   Specimen Description BLOOD RIGHT ANTECUBITAL  Final   Special Requests   Final    BOTTLES DRAWN AEROBIC AND ANAEROBIC Blood Culture adequate volume   Culture   Final    NO GROWTH 4 DAYS Performed at [facility], [address] [place], [place] [address]    Report Status PENDING  Incomplete  MRSA PCR Screening     Status: None   Collection Time: 11/10/19  6:42 AM   Specimen: Nasal Mucosa; Nasopharyngeal  Result  Value Ref Range Status   MRSA by PCR NEGATIVE NEGATIVE Final    Comment:        The GeneXpert MRSA Assay (FDA approved for NASAL specimens only), is one component of a comprehensive MRSA colonization surveillance program. It is not intended to diagnose MRSA  infection nor to guide or monitor treatment for MRSA infections. Performed at [facility], [address] [place], [place] [address]   Culture, blood (Routine X 2) w Reflex to ID Panel     Status: None (Preliminary result)   Collection Time: 11/10/19  7:54 AM   Specimen: BLOOD  Result Value Ref Range Status   Specimen Description BLOOD BLOOD RIGHT FOREARM  Final   Special Requests   Final    BOTTLES DRAWN AEROBIC AND ANAEROBIC Blood Culture adequate volume   Culture   Final    NO GROWTH 3 DAYS Performed at [facility], [address] [place], [place] [address]    Report Status PENDING  Incomplete  Culture, blood (Routine X 2) w Reflex to ID Panel     Status: None (Preliminary result)   Collection Time: 11/10/19  7:55 AM   Specimen: BLOOD  Result Value Ref Range Status   Specimen Description BLOOD BLOOD RIGHT HAND  Final   Special Requests   Final    BOTTLES DRAWN AEROBIC AND ANAEROBIC Blood Culture adequate volume   Culture   Final    NO GROWTH 3 DAYS Performed at [facility], [address], [place], [place] [address]    Report Status PENDING  Incomplete         Radiology Studies: CT ANGIO CHEST PE W OR WO CONTRAST  Result Date: 11/11/2019 CLINICAL DATA:  Shortness of breath EXAM: CT ANGIOGRAPHY CHEST WITH CONTRAST TECHNIQUE: Multidetector CT imaging of the chest was performed using the standard protocol during bolus administration of intravenous contrast. Multiplanar CT image reconstructions and MIPs were obtained to evaluate the vascular anatomy. CONTRAST:  80mL OMNIPAQUE IOHEXOL 350 MG/ML SOLN COMPARISON:  Chest radiograph, 11/09/2019 FINDINGS: Cardiovascular: Satisfactory  opacification of the pulmonary arteries to the segmental level. Positive examination for pulmonary embolism with a small volume of somewhat eccentric appearing embolus present in the distal right pulmonary artery, lobar, and some segmental branches (series 4, image 80). Normal heart size. Left coronary artery calcifications. No pericardial effusion. Mediastinum/Nodes: No enlarged mediastinal, hilar, or axillary lymph nodes. Thyroid gland, trachea, and esophagus demonstrate no significant findings. Lungs/Pleura: Extensive ground-glass and heterogeneous airspace opacity throughout the lungs bilaterally, most conspicuous in the dependent left lung. Trace bilateral pleural effusions. No pleural effusion or pneumothorax. Upper Abdomen: Hepatic steatosis. Musculoskeletal: No chest wall abnormality. No acute or significant osseous findings. Review of the MIP images confirms the above findings. IMPRESSION: 1. Positive examination for pulmonary embolism with a small volume of somewhat eccentric appearing embolus present in the distal right pulmonary artery, right-sided lobar lobar, and some proximal segmental branches. No left-sided embolus noted. 2. Extensive ground-glass and heterogeneous airspace opacity throughout the lungs bilaterally, most conspicuous in the dependent left lung. Findings are consistent with COVID-19 airspace disease. 3. Trace bilateral pleural effusions. 4. Coronary artery disease.  Aortic Atherosclerosis (ICD10-I70.0). 5. Hepatic steatosis. These results were called by telephone at the time of interpretation on 11/11/2019 at 5:25 pm to Dr. Jeoffrey Massed , who verbally acknowledged these results. Electronically Signed   By: Lauralyn Primes M.D.   On: 11/11/2019 17:25   VAS [place] LOWER EXTREMITY VENOUS (DVT)  Result Date: 11/11/2019  Lower Venous Study Indications: Covid-19, elevated D-dimer.  Comparison Study: No prior study on file Performing Technologist: Sherren Kerns RVS  Examination Guidelines:  A complete evaluation includes B-mode imaging, spectral Doppler, color Doppler, and power Doppler as needed of all accessible portions of each vessel. Bilateral testing is  considered an integral part of a complete examination. Limited examinations for reoccurring indications may be performed as noted.  +---------+---------------+---------+-----------+----------+--------------+ RIGHT    CompressibilityPhasicitySpontaneityPropertiesThrombus Aging +---------+---------------+---------+-----------+----------+--------------+ CFV      Full           Yes      Yes                                 +---------+---------------+---------+-----------+----------+--------------+ SFJ      Full                                                        +---------+---------------+---------+-----------+----------+--------------+ FV Prox  Full                                                        +---------+---------------+---------+-----------+----------+--------------+ FV Mid   Full                                                        +---------+---------------+---------+-----------+----------+--------------+ FV DistalFull                                                        +---------+---------------+---------+-----------+----------+--------------+ PFV      Full                                                        +---------+---------------+---------+-----------+----------+--------------+ POP      Full           Yes      Yes                                 +---------+---------------+---------+-----------+----------+--------------+ PTV      Full                                                        +---------+---------------+---------+-----------+----------+--------------+ PERO     Full                                                        +---------+---------------+---------+-----------+----------+--------------+    +---------+---------------+---------+-----------+----------+--------------+ LEFT     CompressibilityPhasicitySpontaneityPropertiesThrombus Aging +---------+---------------+---------+-----------+----------+--------------+ CFV      Full  Yes      Yes                                 +---------+---------------+---------+-----------+----------+--------------+ SFJ      Full                                                        +---------+---------------+---------+-----------+----------+--------------+ FV Prox  Full                                                        +---------+---------------+---------+-----------+----------+--------------+ FV Mid   Full                                                        +---------+---------------+---------+-----------+----------+--------------+ FV DistalFull                                                        +---------+---------------+---------+-----------+----------+--------------+ PFV      Full                                                        +---------+---------------+---------+-----------+----------+--------------+ POP      Full           Yes      Yes                                 +---------+---------------+---------+-----------+----------+--------------+ PTV      Full                                                        +---------+---------------+---------+-----------+----------+--------------+ PERO     Full                                                        +---------+---------------+---------+-----------+----------+--------------+     Summary: Right: There is no evidence of deep vein thrombosis in the lower extremity. Left: There is no evidence of deep vein thrombosis in the lower extremity.  *See table(s) above for measurements and observations. Electronically signed by Lemar Livings MD on 11/11/2019 at 3:46:35 PM.    Final         Scheduled Meds: . vitamin C  500 mg  Oral Daily  .  enoxaparin (LOVENOX) injection  1 mg/kg Subcutaneous Q12H  . insulin aspart  0-20 Units Subcutaneous TID WC  . methylPREDNISolone (SOLU-MEDROL) injection  0.5 mg/kg Intravenous Q12H  . sodium chloride flush  3 mL Intravenous Q12H  . zinc sulfate  220 mg Oral Daily   Continuous Infusions: . sodium chloride    . azithromycin Stopped (11/12/19 1545)  . cefTRIAXone (ROCEPHIN)  IV Stopped (11/12/19 1300)  . remdesivir 100 mg in NS 100 mL 100 mg (11/13/19 0844)     LOS: 2 days   The patient is critically ill with multiple organ systems failure and requires high complexity decision making for assessment and support, frequent evaluation and titration of therapies, application of advanced monitoring technologies and extensive interpretation of multiple databases. Critical Care Time devoted to patient care services described in this note  Time spent: 40 minutes     Jourdain Guay, Roselind Messier, MD [facility] Pager [phone]  If 7PM-7AM, please contact night-coverage [email or web address] Password [email or web address] 11/13/2019, 1:09 PM

## 2019-11-14 ENCOUNTER — Inpatient Hospital Stay (HOSPITAL_COMMUNITY): Payer: Medicaid Other

## 2019-11-14 LAB — CULTURE, BLOOD (ROUTINE X 2)
Culture: NO GROWTH
Culture: NO GROWTH
Special Requests: ADEQUATE
Special Requests: ADEQUATE

## 2019-11-14 LAB — CBC WITH DIFFERENTIAL/PLATELET
Abs Immature Granulocytes: 0.68 10*3/uL — ABNORMAL HIGH (ref 0.00–0.07)
Basophils Absolute: 0 10*3/uL (ref 0.0–0.1)
Basophils Relative: 0 %
Eosinophils Absolute: 0 10*3/uL (ref 0.0–0.5)
Eosinophils Relative: 0 %
HCT: 43.8 % (ref 39.0–52.0)
Hemoglobin: 14 g/dL (ref 13.0–17.0)
Immature Granulocytes: 5 %
Lymphocytes Relative: 15 %
Lymphs Abs: 2.1 10*3/uL (ref 0.7–4.0)
MCH: 25.7 pg — ABNORMAL LOW (ref 26.0–34.0)
MCHC: 32 g/dL (ref 30.0–36.0)
MCV: 80.5 fL (ref 80.0–100.0)
Monocytes Absolute: 0.5 10*3/uL (ref 0.1–1.0)
Monocytes Relative: 4 %
Neutro Abs: 10.4 10*3/uL — ABNORMAL HIGH (ref 1.7–7.7)
Neutrophils Relative %: 76 %
Platelets: 225 10*3/uL (ref 150–400)
RBC: 5.44 MIL/uL (ref 4.22–5.81)
RDW: 14.4 % (ref 11.5–15.5)
WBC: 13.8 10*3/uL — ABNORMAL HIGH (ref 4.0–10.5)
nRBC: 0.4 % — ABNORMAL HIGH (ref 0.0–0.2)

## 2019-11-14 LAB — COMPREHENSIVE METABOLIC PANEL
ALT: 89 U/L — ABNORMAL HIGH (ref 0–44)
AST: 101 U/L — ABNORMAL HIGH (ref 15–41)
Albumin: 3 g/dL — ABNORMAL LOW (ref 3.5–5.0)
Alkaline Phosphatase: 162 U/L — ABNORMAL HIGH (ref 38–126)
Anion gap: 10 (ref 5–15)
BUN: 24 mg/dL — ABNORMAL HIGH (ref 6–20)
CO2: 24 mmol/L (ref 22–32)
Calcium: 7.9 mg/dL — ABNORMAL LOW (ref 8.9–10.3)
Chloride: 105 mmol/L (ref 98–111)
Creatinine, Ser: 1.06 mg/dL (ref 0.61–1.24)
GFR calc Af Amer: >60 mL/min (ref >60)
GFR calc non Af Amer: >60 mL/min (ref >60)
Glucose, Bld: 157 mg/dL — ABNORMAL HIGH (ref 70–99)
Potassium: 5 mmol/L (ref 3.5–5.1)
Sodium: 139 mmol/L (ref 135–145)
Total Bilirubin: 0.8 mg/dL (ref 0.3–1.2)
Total Protein: 6.9 g/dL (ref 6.5–8.1)

## 2019-11-14 LAB — D-DIMER, QUANTITATIVE: D-Dimer, Quant: 11.49 ug/mL-FEU — ABNORMAL HIGH (ref 0.00–0.50)

## 2019-11-14 LAB — MAGNESIUM: Magnesium: 2.8 mg/dL — ABNORMAL HIGH (ref 1.7–2.4)

## 2019-11-14 LAB — LACTIC ACID, PLASMA: Lactic Acid, Venous: 2.6 mmol/L (ref 0.5–1.9)

## 2019-11-14 LAB — GLUCOSE, CAPILLARY
Glucose-Capillary: 134 mg/dL — ABNORMAL HIGH (ref 70–99)
Glucose-Capillary: 140 mg/dL — ABNORMAL HIGH (ref 70–99)
Glucose-Capillary: 258 mg/dL — ABNORMAL HIGH (ref 70–99)

## 2019-11-14 LAB — C-REACTIVE PROTEIN: CRP: 0.9 mg/dL (ref <1.0)

## 2019-11-14 LAB — FERRITIN: Ferritin: 388 ng/mL — ABNORMAL HIGH (ref 24–336)

## 2019-11-14 LAB — PHOSPHORUS: Phosphorus: 3.6 mg/dL (ref 2.5–4.6)

## 2019-11-14 LAB — PROCALCITONIN
Procalcitonin: 0.33 ng/mL
Procalcitonin: 0.33 ng/mL

## 2019-11-14 NOTE — Progress Notes (Signed)
Pt declined to have me update wife, stated that he had already spoke to his wife and there is nothing new.

## 2019-11-14 NOTE — Progress Notes (Signed)
Spoke to and updated wife. Reviewed patient's condition, oxygen level, plan of care and current treatment

## 2019-11-14 NOTE — Progress Notes (Signed)
PROGRESS NOTE    Nathaniel Webb  SEG:[id number] DOB: 1966/03/28 DOA: 11/11/2019 PCP: Langston Reusing, NP   Brief Narrative:  54 year old BM PMHx diabetes type 2 uncontrolled with complication/diabetic neuropathy,, HTN, HLD, obesity  Presented to [facility] with approximately 1 week history of gradually worsening shortness of breath.  He was found to have acute hypoxic respiratory failure and was requiring high flow oxygen to maintain O2 saturations.  Chest x-ray was typical of Covid pneumonitis.    Patient stated had a URI symptoms, weakness that started before his shortness of breath.  He presented to the ED on 12/23 and was diagnosed with COVID-19-but was discharged home.    Subjective: 1/1 afebrile last 24 hours A/O x4, negative abdominal pain, negative CP, positive SOB   Assessment & Plan:   Principal Problem:   Acute respiratory failure with hypoxemia (HCC) Active Problems:   Type 2 diabetes, controlled, with peripheral neuropathy ([place])   Hypertension   Pneumonia due to COVID-19 virus   Diabetes mellitus type 2, uncontrolled, with complications ([place])   Acute pulmonary embolus ([place])   Demand ischemia (HCC)   Covid pneumonia/acute respiratory failure with hypoxia  COVID-19 Labs  Recent Labs    11/12/19 0130 11/13/19 0120 11/14/19 0130  DDIMER >20.00* 15.01* 11.49*  FERRITIN 531* 460* 388*  CRP 4.7* 2.2* 0.9    No results found for: SARSCOV2NAA -Solu-Medrol 53 mg BID -Remdesivir per pharmacy protocol -Actemra received at [facility] -Combivent -Flutter valve -Incentive spirometry -Titrate O2 to maintain SPO2> 88% -Prone patient for 16 hours/day; if patient cannot tolerate prone 2 to 3 hours per shift  Acute PE -CTA chest PE protocol positive for PE see results below -Currently on full dose Lovenox -Bilateral lower extremity Dopplers negative for DVT -On 1/1  NCM to determine best DOAC to place patient on in relation to his insurance.  Essential  HTN -Currently controlled  Diabetes type 2 uncontrolled with complication -73/71 hemoglobin A1c= 7.3 -Resistant SSI  Mild transaminitis -Most likely secondary to Covid, remdesivir, and Actemra monitor closely.    Minimally elevated troponin level: Demand ischemia -Downtrending-EKG reassuring-likely demand ischemia.  Doubt any further work-up is required.    DVT prophylaxis: Full dose Lovenox Code Status: Full Family Communication: 12/31 spoke with Guerry Minors (wife) counseled her on plan of care answered all questions Disposition Plan:    Consultants:    Procedures/Significant Events:  12/29 CTA chest PE protocol; positive PE small volume of somewhat eccentric appearing embolus present in the distal right pulmonary artery, right-sided lobar lobar, and some proximal segmental branches. No left-sided embolus noted. -Extensive ground-glass and heterogeneous airspace opacity throughout the lungs bilaterally, most conspicuous in the dependent left lung. -CAD- Hepatic steatosis. 12/29 bilateral lower extremity Doppler negative for DVT   I have personally reviewed and interpreted all radiology studies and my findings are as above.  VENTILATOR SETTINGS: HFNC+ NRB 1/1 Flow; 15 L/min SPO2 88%    Cultures   Antimicrobials: Anti-infectives (From admission, onward)   Start     Dose/Rate Stop   11/12/19 1000  remdesivir 100 mg in sodium chloride 0.9 % 100 mL IVPB  Status:  Discontinued     100 mg 200 mL/hr over 30 Minutes 11/11/19 1238   11/12/19 1000  remdesivir 100 mg in sodium chloride 0.9 % 100 mL IVPB     100 mg 200 mL/hr over 30 Minutes 11/15/19 0959   11/11/19 1330  cefTRIAXone (ROCEPHIN) 1 g in sodium chloride 0.9 % 100 mL IVPB  1 g 200 mL/hr over 30 Minutes 11/16/19 1314   11/11/19 1330  azithromycin (ZITHROMAX) 500 mg in sodium chloride 0.9 % 250 mL IVPB     500 mg 250 mL/hr over 60 Minutes 11/14/19 1329   11/11/19 1230  remdesivir 200 mg in sodium chloride  0.9% 250 mL IVPB  Status:  Discontinued     200 mg 580 mL/hr over 30 Minutes 11/11/19 1238       Devices    LINES / TUBES:      Continuous Infusions: . sodium chloride    . cefTRIAXone (ROCEPHIN)  IV Stopped (11/13/19 1447)     Objective: Vitals:   11/13/19 1948 11/13/19 2000 11/14/19 0333 11/14/19 0802  BP: 135/68  126/78 114/73  Pulse: 66  62   Resp: 20  20 19   Temp:  98.1 F (36.7 C) 98.4 F (36.9 C) 98.2 F (36.8 C)  TempSrc: Axillary Axillary Axillary Oral  SpO2: 91%  93% 91%  Weight:      Height:        Intake/Output Summary (Last 24 hours) at 11/14/2019 1253 Last data filed at 11/14/2019 1201 Gross per 24 hour  Intake 1208.6 ml  Output 2000 ml  Net -791.4 ml   Filed Weights   11/11/19 1500  Weight: 106.1 kg    Physical Exam:  General: A/O x4, positive acute respiratory distress Eyes: negative scleral hemorrhage, negative anisocoria, negative icterus ENT: Negative Runny nose, negative gingival bleeding, Neck:  Negative scars, masses, torticollis, lymphadenopathy, JVD Lungs: Tachypneic, clear to auscultation bilaterally without wheezes or crackles (eating) Cardiovascular: Regular rate and rhythm without murmur gallop or rub normal S1 and S2 Abdomen: negative abdominal pain, nondistended, positive soft, bowel sounds, no rebound, no ascites, no appreciable mass Extremities: No significant cyanosis, clubbing, or edema bilateral lower extremities Skin: Negative rashes, lesions, ulcers Psychiatric:  Negative depression, negative anxiety, negative fatigue, negative mania  Central nervous system:  Cranial nerves II through XII intact, tongue/uvula midline, all extremities muscle strength 5/5, sensation intact throughout, negative dysarthria, negative expressive aphasia, negative receptive aphasia.    .     Data Reviewed: Care during the described time interval was provided by me .  I have reviewed this patient's available data, including medical history,  events of note, physical examination, and all test results as part of my evaluation.   CBC: Recent Labs  Lab 11/10/19 0755 11/11/19 0656 11/12/19 0130 11/13/19 0120 11/14/19 0130  WBC 12.4* 13.9* 16.8* 13.7* 13.8*  NEUTROABS 10.2* 12.2* 14.0* 10.6* 10.4*  HGB 13.2 13.5 13.3 14.1 14.0  HCT 40.8 42.1 42.2 43.8 43.8  MCV 78.9* 80.5 81.3 81.9 80.5  PLT 241 257 250 241 225   Basic Metabolic Panel: Recent Labs  Lab 11/10/19 0755 11/11/19 0656 11/12/19 0130 11/12/19 0400 11/13/19 0120 11/14/19 0130  NA 140 141 139  --  140 139  K 4.6 4.7 4.5  --  4.9 5.0  CL 105 105 103  --  102 105  CO2 22 23 25   --  22 24  GLUCOSE 158* 171* 139*  --  137* 157*  BUN 37* 44* 38*  --  29* 24*  CREATININE 1.37* 1.21 1.23  --  1.12 1.06  CALCIUM 7.8* 7.7* 7.9*  --  8.0* 7.9*  MG 3.0* 3.3*  --  3.5* 3.3* 2.8*  PHOS 4.3 4.2  --  3.7 3.5 3.6   GFR: Estimated Creatinine Clearance: 91.4 mL/min (by C-G formula based on SCr of 1.06 mg/dL). Liver  Function Tests: Recent Labs  Lab 11/10/19 0755 11/11/19 0656 11/12/19 0130 11/13/19 0120 11/14/19 0130  AST 132* 109* 97* 102* 101*  ALT 77* 85* 84* 90* 89*  ALKPHOS 78 92 129* 161* 162*  BILITOT 0.8 0.8 1.1 0.9 0.8  PROT 7.8 7.5 7.5 7.4 6.9  ALBUMIN 3.0* 2.7* 2.9* 3.2* 3.0*   No results for input(s): LIPASE, AMYLASE in the last 168 hours. No results for input(s): AMMONIA in the last 168 hours. Coagulation Profile: No results for input(s): INR, PROTIME in the last 168 hours. Cardiac Enzymes: No results for input(s): CKTOTAL, CKMB, CKMBINDEX, TROPONINI in the last 168 hours. BNP (last 3 results) No results for input(s): PROBNP in the last 8760 hours. HbA1C: No results for input(s): HGBA1C in the last 72 hours. CBG: Recent Labs  Lab 11/13/19 0846 11/13/19 1159 11/13/19 1748 11/13/19 2005 11/14/19 0800  GLUCAP 165* 140* 146* 179* 134*   Lipid Profile: No results for input(s): CHOL, HDL, LDLCALC, TRIG, CHOLHDL, LDLDIRECT in the last 72  hours. Thyroid Function Tests: No results for input(s): TSH, T4TOTAL, FREET4, T3FREE, THYROIDAB in the last 72 hours. Anemia Panel: Recent Labs    11/13/19 0120 11/14/19 0130  FERRITIN 460* 388*   Urine analysis:    Component Value Date/Time   COLORURINE YELLOW (A) 07/08/2018 1113   APPEARANCEUR CLEAR (A) 07/08/2018 1113   LABSPEC 1.013 07/08/2018 1113   PHURINE 5.0 07/08/2018 1113   GLUCOSEU NEGATIVE 07/08/2018 1113   HGBUR NEGATIVE 07/08/2018 1113   BILIRUBINUR NEGATIVE 07/08/2018 1113   KETONESUR NEGATIVE 07/08/2018 1113   PROTEINUR NEGATIVE 07/08/2018 1113   NITRITE NEGATIVE 07/08/2018 1113   LEUKOCYTESUR NEGATIVE 07/08/2018 1113   Sepsis Labs: @LABRCNTIP (procalcitonin:4,lacticidven:4)  ) Recent Results (from the past 240 hour(s))  Blood Culture (routine x 2)     Status: None   Collection Time: 11/09/19 10:51 PM   Specimen: BLOOD LEFT HAND  Result Value Ref Range Status   Specimen Description BLOOD LEFT HAND  Final   Special Requests   Final    BOTTLES DRAWN AEROBIC AND ANAEROBIC Blood Culture adequate volume   Culture   Final    NO GROWTH 5 DAYS Performed at [facility], [address] [place], [place]    Report Status 11/14/2019 FINAL  Final  Blood Culture (routine x 2)     Status: None   Collection Time: 11/09/19 11:03 PM   Specimen: BLOOD  Result Value Ref Range Status   Specimen Description BLOOD RIGHT ANTECUBITAL  Final   Special Requests   Final    BOTTLES DRAWN AEROBIC AND ANAEROBIC Blood Culture adequate volume   Culture   Final    NO GROWTH 5 DAYS Performed at [facility], [address] [place], [place]    Report Status 11/14/2019 FINAL  Final  MRSA PCR Screening     Status: None   Collection Time: 11/10/19  6:42 AM   Specimen: Nasal Mucosa; Nasopharyngeal  Result Value Ref Range Status   MRSA by PCR NEGATIVE NEGATIVE Final    Comment:        The GeneXpert MRSA Assay (FDA approved for NASAL  specimens only), is one component of a comprehensive MRSA colonization surveillance program. It is not intended to diagnose MRSA infection nor to guide or monitor treatment for MRSA infections. Performed at [facility], [address] [place], [place]   Culture, blood (Routine X 2) w Reflex to ID Panel     Status:  None (Preliminary result)   Collection Time: 11/10/19  7:54 AM   Specimen: BLOOD  Result Value Ref Range Status   Specimen Description BLOOD BLOOD RIGHT FOREARM  Final   Special Requests   Final    BOTTLES DRAWN AEROBIC AND ANAEROBIC Blood Culture adequate volume   Culture   Final    NO GROWTH 4 DAYS Performed at [facility], [address] [place], [place] [address]    Report Status PENDING  Incomplete  Culture, blood (Routine X 2) w Reflex to ID Panel     Status: None (Preliminary result)   Collection Time: 11/10/19  7:55 AM   Specimen: BLOOD  Result Value Ref Range Status   Specimen Description BLOOD BLOOD RIGHT HAND  Final   Special Requests   Final    BOTTLES DRAWN AEROBIC AND ANAEROBIC Blood Culture adequate volume   Culture   Final    NO GROWTH 4 DAYS Performed at [facility], [address] [place], [place] [address]    Report Status PENDING  Incomplete         Radiology Studies: No results found.      Scheduled Meds: . vitamin C  500 mg Oral Daily  . enoxaparin (LOVENOX) injection  1 mg/kg Subcutaneous Q12H  . insulin aspart  0-20 Units Subcutaneous TID WC  . methylPREDNISolone (SOLU-MEDROL) injection  0.5 mg/kg Intravenous Q12H  . sodium chloride flush  3 mL Intravenous Q12H  . zinc sulfate  220 mg Oral Daily   Continuous Infusions: . sodium chloride    . cefTRIAXone (ROCEPHIN)  IV Stopped (11/13/19 1447)     LOS: 3 days   The patient is critically ill with multiple organ systems failure and requires high complexity decision making for assessment and support, frequent evaluation and  titration of therapies, application of advanced monitoring technologies and extensive interpretation of multiple databases. Critical Care Time devoted to patient care services described in this note  Time spent: 40 minutes     Mahlon Gabrielle, Roselind Messier, MD [facility] Pager [phone]  If 7PM-7AM, please contact night-coverage [email or web address] Password [facility] 11/14/2019, 12:53 PM

## 2019-11-14 NOTE — Plan of Care (Signed)
  Problem: Education: Goal: Knowledge of risk factors and measures for prevention of condition will improve Outcome: Progressing   Problem: Coping: Goal: Psychosocial and spiritual needs will be supported Outcome: Progressing   Problem: Respiratory: Goal: Will maintain a patent airway Outcome: Progressing Goal: Complications related to the disease process, condition or treatment will be avoided or minimized Outcome: Progressing   

## 2019-11-14 NOTE — Progress Notes (Addendum)
CRITICAL VALUE ALERT  Critical Value:  Lactic acid 2.4 (specimen was hemolyzed)  Date & Time Notied:  11/14/19 1435  Provider Notified: C. Joseph Art and Primary RN  Orders Received/Actions taken: notified provider of hemolyzed specimen as well as procalcitonin unable to run--order replaced

## 2019-11-15 LAB — COMPREHENSIVE METABOLIC PANEL
ALT: 108 U/L — ABNORMAL HIGH (ref 0–44)
AST: 96 U/L — ABNORMAL HIGH (ref 15–41)
Albumin: 3.4 g/dL — ABNORMAL LOW (ref 3.5–5.0)
Alkaline Phosphatase: 192 U/L — ABNORMAL HIGH (ref 38–126)
Anion gap: 13 (ref 5–15)
BUN: 24 mg/dL — ABNORMAL HIGH (ref 6–20)
CO2: 24 mmol/L (ref 22–32)
Calcium: 8.3 mg/dL — ABNORMAL LOW (ref 8.9–10.3)
Chloride: 101 mmol/L (ref 98–111)
Creatinine, Ser: 1.03 mg/dL (ref 0.61–1.24)
GFR calc Af Amer: >60 mL/min (ref >60)
GFR calc non Af Amer: >60 mL/min (ref >60)
Glucose, Bld: 164 mg/dL — ABNORMAL HIGH (ref 70–99)
Potassium: 5.2 mmol/L — ABNORMAL HIGH (ref 3.5–5.1)
Sodium: 138 mmol/L (ref 135–145)
Total Bilirubin: 0.9 mg/dL (ref 0.3–1.2)
Total Protein: 7.2 g/dL (ref 6.5–8.1)

## 2019-11-15 LAB — MAGNESIUM: Magnesium: 2.8 mg/dL — ABNORMAL HIGH (ref 1.7–2.4)

## 2019-11-15 LAB — CBC WITH DIFFERENTIAL/PLATELET
Abs Immature Granulocytes: 0.79 10*3/uL — ABNORMAL HIGH (ref 0.00–0.07)
Basophils Absolute: 0.1 10*3/uL (ref 0.0–0.1)
Basophils Relative: 0 %
Eosinophils Absolute: 0.1 10*3/uL (ref 0.0–0.5)
Eosinophils Relative: 0 %
HCT: 46.4 % (ref 39.0–52.0)
Hemoglobin: 14.9 g/dL (ref 13.0–17.0)
Immature Granulocytes: 5 %
Lymphocytes Relative: 11 %
Lymphs Abs: 1.6 10*3/uL (ref 0.7–4.0)
MCH: 26.1 pg (ref 26.0–34.0)
MCHC: 32.1 g/dL (ref 30.0–36.0)
MCV: 81.4 fL (ref 80.0–100.0)
Monocytes Absolute: 0.7 10*3/uL (ref 0.1–1.0)
Monocytes Relative: 5 %
Neutro Abs: 11.5 10*3/uL — ABNORMAL HIGH (ref 1.7–7.7)
Neutrophils Relative %: 79 %
Platelets: 232 10*3/uL (ref 150–400)
RBC: 5.7 MIL/uL (ref 4.22–5.81)
RDW: 15.6 % — ABNORMAL HIGH (ref 11.5–15.5)
WBC: 14.6 10*3/uL — ABNORMAL HIGH (ref 4.0–10.5)
nRBC: 0.5 % — ABNORMAL HIGH (ref 0.0–0.2)

## 2019-11-15 LAB — FERRITIN: Ferritin: 408 ng/mL — ABNORMAL HIGH (ref 24–336)

## 2019-11-15 LAB — GLUCOSE, CAPILLARY
Glucose-Capillary: 134 mg/dL — ABNORMAL HIGH (ref 70–99)
Glucose-Capillary: 128 mg/dL — ABNORMAL HIGH (ref 70–99)
Glucose-Capillary: 159 mg/dL — ABNORMAL HIGH (ref 70–99)
Glucose-Capillary: 302 mg/dL — ABNORMAL HIGH (ref 70–99)

## 2019-11-15 LAB — CULTURE, BLOOD (ROUTINE X 2)
Culture: NO GROWTH
Culture: NO GROWTH
Special Requests: ADEQUATE
Special Requests: ADEQUATE

## 2019-11-15 LAB — C-REACTIVE PROTEIN: CRP: 0.7 mg/dL (ref <1.0)

## 2019-11-15 LAB — D-DIMER, QUANTITATIVE: D-Dimer, Quant: 9.62 ug/mL-FEU — ABNORMAL HIGH (ref 0.00–0.50)

## 2019-11-15 LAB — PHOSPHORUS: Phosphorus: 4 mg/dL (ref 2.5–4.6)

## 2019-11-15 MED ORDER — FUROSEMIDE 10 MG/ML IJ SOLN
60.0000 mg | Freq: Once | INTRAMUSCULAR | Status: AC
  Administered 2019-11-15: 60 mg via INTRAVENOUS
  Filled 2019-11-15: qty 6

## 2019-11-15 MED ORDER — SODIUM ZIRCONIUM CYCLOSILICATE 10 G PO PACK
10.0000 g | PACK | Freq: Three times a day (TID) | ORAL | Status: AC
  Administered 2019-11-15 (×3): 10 g via ORAL
  Filled 2019-11-15 (×4): qty 1

## 2019-11-15 NOTE — Plan of Care (Signed)
Assist with ambulating pt in room

## 2019-11-15 NOTE — Progress Notes (Addendum)
PROGRESS NOTE    Nathaniel Webb  QAS:[id number] DOB: Jan 17, 1966 DOA: 11/11/2019 PCP: Rolm Gala, NP   Brief Narrative:  54 year old BM PMHx diabetes type 2 uncontrolled with complication/diabetic neuropathy,, HTN, HLD, obesity  Presented to [facility] with approximately 1 week history of gradually worsening shortness of breath.  He was found to have acute hypoxic respiratory failure and was requiring high flow oxygen to maintain O2 saturations.  Chest x-ray was typical of Covid pneumonitis.    Patient stated had a URI symptoms, weakness that started before his shortness of breath.  He presented to the ED on 12/23 and was diagnosed with COVID-19-but was discharged home.    Subjective: 1/2 afebrile last 24 hours negative abdominal pain negative CP positive S OB but appears more comfortable   Assessment & Plan:   Principal Problem:   Acute respiratory failure with hypoxemia (HCC) Active Problems:   Type 2 diabetes, controlled, with peripheral neuropathy (HCC)   Hypertension   Pneumonia due to COVID-19 virus   Diabetes mellitus type 2, uncontrolled, with complications (HCC)   Acute pulmonary embolus (HCC)   Demand ischemia (HCC)   Covid pneumonia/acute respiratory failure with hypoxia  COVID-19 Labs  Recent Labs    11/13/19 0120 11/14/19 0130 11/15/19 0307  DDIMER 15.01* 11.49* 9.62*  FERRITIN 460* 388* 408*  CRP 2.2* 0.9 0.7    No results found for: SARSCOV2NAA -Solu-Medrol 53 mg BID -Remdesivir per pharmacy protocol -Actemra received at [facility] -Combivent -Flutter valve -Incentive spirometry -Titrate O2 to maintain SPO2> 88% -Prone patient for 16 hours/day; if patient cannot tolerate prone 2 to 3 hours per shift -1/2 Lasix IV 60 mg -Lasix as needed  Acute PE -CTA chest PE protocol positive for PE see results below -Currently on full dose Lovenox -Bilateral lower extremity Dopplers negative for DVT   Essential HTN -Currently controlled  Diabetes  type 2 uncontrolled with complication -12/28 hemoglobin A1c= 7.3 -Resistant SSI  Mild transaminitis -Most likely secondary to Covid, remdesivir, and Actemra monitor closely.    Minimally elevated troponin level: Demand ischemia -Downtrending-EKG reassuring-likely demand ischemia.  Doubt any further work-up is required.  Hyperkalemia -Lokelma 10 g x 3 dose  Goals of care -On 1/2 requested that LCSW Angeline Slim  determine best DOAC to place patient on in relation to his insurance.     DVT prophylaxis: Full dose Lovenox Code Status: Full Family Communication: 11/15/2019 spoke with Margaretha Glassing (wife) counseled her on plan of care answered all questions Disposition Plan:    Consultants:    Procedures/Significant Events:  12/29 CTA chest PE protocol; positive PE small volume of somewhat eccentric appearing embolus present in the distal right pulmonary artery, right-sided lobar lobar, and some proximal segmental branches. No left-sided embolus noted. -Extensive ground-glass and heterogeneous airspace opacity throughout the lungs bilaterally, most conspicuous in the dependent left lung. -CAD- Hepatic steatosis. 12/29 bilateral lower extremity Doppler negative for DVT 1/1 PCXR; Diffuse bilateral airspace opacities consistent with the given clinical history of COVID-19 positivity. Stable    I have personally reviewed and interpreted all radiology studies and my findings are as above.  VENTILATOR SETTINGS: HFNC+ NRB 1/2 Flow; 15 L/min SPO2 96%    Cultures   Antimicrobials: Anti-infectives (From admission, onward)   Start     Dose/Rate Stop   11/12/19 1000  remdesivir 100 mg in sodium chloride 0.9 % 100 mL IVPB  Status:  Discontinued     100 mg 200 mL/hr over 30 Minutes 11/11/19 1238   11/12/19 1000  remdesivir 100 mg in sodium chloride 0.9 % 100 mL IVPB     100 mg 200 mL/hr over 30 Minutes 11/15/19 0959   11/11/19 1330  cefTRIAXone (ROCEPHIN) 1 g in sodium chloride 0.9 %  100 mL IVPB     1 g 200 mL/hr over 30 Minutes 11/16/19 1314   11/11/19 1330  azithromycin (ZITHROMAX) 500 mg in sodium chloride 0.9 % 250 mL IVPB     500 mg 250 mL/hr over 60 Minutes 11/14/19 1329   11/11/19 1230  remdesivir 200 mg in sodium chloride 0.9% 250 mL IVPB  Status:  Discontinued     200 mg 580 mL/hr over 30 Minutes 11/11/19 1238       Devices    LINES / TUBES:      Continuous Infusions: . sodium chloride    . cefTRIAXone (ROCEPHIN)  IV 1 g (11/14/19 1453)     Objective: Vitals:   11/14/19 2012 11/14/19 2133 11/15/19 0351 11/15/19 0700  BP:   133/79 135/85  Pulse:  74 61   Resp:  20 20 (!) 24  Temp: 99.1 F (37.3 C)  98 F (36.7 C) 97.8 F (36.6 C)  TempSrc: Axillary  Axillary Oral  SpO2:  92% 93% 93%  Weight:      Height:        Intake/Output Summary (Last 24 hours) at 11/15/2019 1026 Last data filed at 11/15/2019 0055 Gross per 24 hour  Intake 360 ml  Output 750 ml  Net -390 ml   Filed Weights   11/11/19 1500  Weight: 106.1 kg   Physical Exam:  General: A/O x4, positive acute respiratory distress Eyes: negative scleral hemorrhage, negative anisocoria, negative icterus ENT: Negative Runny nose, negative gingival bleeding, Neck:  Negative scars, masses, torticollis, lymphadenopathy, JVD Lungs: Decreased breath sounds bilaterally without wheezes or crackles Cardiovascular: Regular rate and rhythm without murmur gallop or rub normal S1 and S2 Abdomen: negative abdominal pain, nondistended, positive soft, bowel sounds, no rebound, no ascites, no appreciable mass Extremities: No significant cyanosis, clubbing, or edema bilateral lower extremities Skin: Negative rashes, lesions, ulcers Psychiatric:  Negative depression, negative anxiety, negative fatigue, negative mania  Central nervous system:  Cranial nerves II through XII intact, tongue/uvula midline, all extremities muscle strength 5/5, sensation intact throughout, negative dysarthria, negative  expressive aphasia, negative receptive aphasia.     .     Data Reviewed: Care during the described time interval was provided by me .  I have reviewed this patient's available data, including medical history, events of note, physical examination, and all test results as part of my evaluation.   CBC: Recent Labs  Lab 11/11/19 0656 11/12/19 0130 11/13/19 0120 11/14/19 0130 11/15/19 0307  WBC 13.9* 16.8* 13.7* 13.8* 14.6*  NEUTROABS 12.2* 14.0* 10.6* 10.4* 11.5*  HGB 13.5 13.3 14.1 14.0 14.9  HCT 42.1 42.2 43.8 43.8 46.4  MCV 80.5 81.3 81.9 80.5 81.4  PLT 257 250 241 225 232   Basic Metabolic Panel: Recent Labs  Lab 11/11/19 0656 11/12/19 0130 11/12/19 0400 11/13/19 0120 11/14/19 0130 11/15/19 0307  NA 141 139  --  140 139 138  K 4.7 4.5  --  4.9 5.0 5.2*  CL 105 103  --  102 105 101  CO2 23 25  --  22 24 24   GLUCOSE 171* 139*  --  137* 157* 164*  BUN 44* 38*  --  29* 24* 24*  CREATININE 1.21 1.23  --  1.12 1.06 1.03  CALCIUM 7.7*  7.9*  --  8.0* 7.9* 8.3*  MG 3.3*  --  3.5* 3.3* 2.8* 2.8*  PHOS 4.2  --  3.7 3.5 3.6 4.0   GFR: Estimated Creatinine Clearance: 94.1 mL/min (by C-G formula based on SCr of 1.03 mg/dL). Liver Function Tests: Recent Labs  Lab 11/11/19 0656 11/12/19 0130 11/13/19 0120 11/14/19 0130 11/15/19 0307  AST 109* 97* 102* 101* 96*  ALT 85* 84* 90* 89* 108*  ALKPHOS 92 129* 161* 162* 192*  BILITOT 0.8 1.1 0.9 0.8 0.9  PROT 7.5 7.5 7.4 6.9 7.2  ALBUMIN 2.7* 2.9* 3.2* 3.0* 3.4*   No results for input(s): LIPASE, AMYLASE in the last 168 hours. No results for input(s): AMMONIA in the last 168 hours. Coagulation Profile: No results for input(s): INR, PROTIME in the last 168 hours. Cardiac Enzymes: No results for input(s): CKTOTAL, CKMB, CKMBINDEX, TROPONINI in the last 168 hours. BNP (last 3 results) No results for input(s): PROBNP in the last 8760 hours. HbA1C: No results for input(s): HGBA1C in the last 72 hours. CBG: Recent Labs    Lab 11/13/19 2005 11/14/19 0800 11/14/19 1648 11/14/19 1950 11/15/19 0744  GLUCAP 179* 134* 140* 258* 134*   Lipid Profile: No results for input(s): CHOL, HDL, LDLCALC, TRIG, CHOLHDL, LDLDIRECT in the last 72 hours. Thyroid Function Tests: No results for input(s): TSH, T4TOTAL, FREET4, T3FREE, THYROIDAB in the last 72 hours. Anemia Panel: Recent Labs    11/14/19 0130 11/15/19 0307  FERRITIN 388* 408*   Urine analysis:    Component Value Date/Time   COLORURINE YELLOW (A) 07/08/2018 1113   APPEARANCEUR CLEAR (A) 07/08/2018 1113   LABSPEC 1.013 07/08/2018 1113   PHURINE 5.0 07/08/2018 1113   GLUCOSEU NEGATIVE 07/08/2018 1113   HGBUR NEGATIVE 07/08/2018 1113   BILIRUBINUR NEGATIVE 07/08/2018 1113   KETONESUR NEGATIVE 07/08/2018 1113   PROTEINUR NEGATIVE 07/08/2018 1113   NITRITE NEGATIVE 07/08/2018 1113   LEUKOCYTESUR NEGATIVE 07/08/2018 1113   Sepsis Labs: @LABRCNTIP (procalcitonin:4,lacticidven:4)  ) Recent Results (from the past 240 hour(s))  Blood Culture (routine x 2)     Status: None   Collection Time: 11/09/19 10:51 PM   Specimen: BLOOD LEFT HAND  Result Value Ref Range Status   Specimen Description BLOOD LEFT HAND  Final   Special Requests   Final    BOTTLES DRAWN AEROBIC AND ANAEROBIC Blood Culture adequate volume   Culture   Final    NO GROWTH 5 DAYS Performed at [facility], [address], [place], [place]    Report Status 11/14/2019 FINAL  Final  Blood Culture (routine x 2)     Status: None   Collection Time: 11/09/19 11:03 PM   Specimen: BLOOD  Result Value Ref Range Status   Specimen Description BLOOD RIGHT ANTECUBITAL  Final   Special Requests   Final    BOTTLES DRAWN AEROBIC AND ANAEROBIC Blood Culture adequate volume   Culture   Final    NO GROWTH 5 DAYS Performed at [facility], [address], [place], [place]    Report Status 11/14/2019 FINAL  Final  MRSA PCR Screening     Status: None    Collection Time: 11/10/19  6:42 AM   Specimen: Nasal Mucosa; Nasopharyngeal  Result Value Ref Range Status   MRSA by PCR NEGATIVE NEGATIVE Final    Comment:        The GeneXpert MRSA Assay (FDA approved for NASAL specimens only), is one component of a comprehensive MRSA colonization surveillance program.  It is not intended to diagnose MRSA infection nor to guide or monitor treatment for MRSA infections. Performed at [facility], [address] [place], [place] [address]   Culture, blood (Routine X 2) w Reflex to ID Panel     Status: None   Collection Time: 11/10/19  7:54 AM   Specimen: BLOOD  Result Value Ref Range Status   Specimen Description BLOOD BLOOD RIGHT FOREARM  Final   Special Requests   Final    BOTTLES DRAWN AEROBIC AND ANAEROBIC Blood Culture adequate volume   Culture   Final    NO GROWTH 5 DAYS Performed at [facility], [address] [place], [place] [address]    Report Status 11/15/2019 FINAL  Final  Culture, blood (Routine X 2) w Reflex to ID Panel     Status: None   Collection Time: 11/10/19  7:55 AM   Specimen: BLOOD  Result Value Ref Range Status   Specimen Description BLOOD BLOOD RIGHT HAND  Final   Special Requests   Final    BOTTLES DRAWN AEROBIC AND ANAEROBIC Blood Culture adequate volume   Culture   Final    NO GROWTH 5 DAYS Performed at [facility], [address] [place], [place] [address]    Report Status 11/15/2019 FINAL  Final         Radiology Studies: DG CHEST PORT 1 VIEW  Result Date: 11/14/2019 CLINICAL DATA:  Follow-up pneumonia, COVID-19 positivity EXAM: PORTABLE CHEST 1 VIEW COMPARISON:  11/09/2019 FINDINGS: Cardiac shadow is stable. Lungs are well aerated with diffuse bilateral airspace opacities consistent with the given clinical history of COVID-19 positivity. The overall appearance is relatively stable from the prior exam. No bony abnormality is seen. IMPRESSION: Bilateral airspace opacities  consistent with the given clinical history. Electronically Signed   By: Inez Catalina M.D.   On: 11/14/2019 18:37        Scheduled Meds: . vitamin C  500 mg Oral Daily  . enoxaparin (LOVENOX) injection  1 mg/kg Subcutaneous Q12H  . insulin aspart  0-20 Units Subcutaneous TID WC  . methylPREDNISolone (SOLU-MEDROL) injection  0.5 mg/kg Intravenous Q12H  . sodium chloride flush  3 mL Intravenous Q12H  . zinc sulfate  220 mg Oral Daily   Continuous Infusions: . sodium chloride    . cefTRIAXone (ROCEPHIN)  IV 1 g (11/14/19 1453)     LOS: 4 days   The patient is critically ill with multiple organ systems failure and requires high complexity decision making for assessment and support, frequent evaluation and titration of therapies, application of advanced monitoring technologies and extensive interpretation of multiple databases. Critical Care Time devoted to patient care services described in this note  Time spent: 40 minutes     Mikle Sternberg, Geraldo Docker, MD [facility] Pager [phone]  If 7PM-7AM, please contact night-coverage [email or web address] Password [email or web address] 11/15/2019, 10:26 AM

## 2019-11-16 LAB — CBC WITH DIFFERENTIAL/PLATELET
Abs Immature Granulocytes: 0.85 10*3/uL — ABNORMAL HIGH (ref 0.00–0.07)
Basophils Absolute: 0.1 10*3/uL (ref 0.0–0.1)
Basophils Relative: 0 %
Eosinophils Absolute: 0.1 10*3/uL (ref 0.0–0.5)
Eosinophils Relative: 1 %
HCT: 49 % (ref 39.0–52.0)
Hemoglobin: 15.7 g/dL (ref 13.0–17.0)
Immature Granulocytes: 5 %
Lymphocytes Relative: 10 %
Lymphs Abs: 1.7 10*3/uL (ref 0.7–4.0)
MCH: 26.3 pg (ref 26.0–34.0)
MCHC: 32 g/dL (ref 30.0–36.0)
MCV: 82.1 fL (ref 80.0–100.0)
Monocytes Absolute: 0.6 10*3/uL (ref 0.1–1.0)
Monocytes Relative: 4 %
Neutro Abs: 13.5 10*3/uL — ABNORMAL HIGH (ref 1.7–7.7)
Neutrophils Relative %: 80 %
Platelets: 245 10*3/uL (ref 150–400)
RBC: 5.97 MIL/uL — ABNORMAL HIGH (ref 4.22–5.81)
RDW: 16 % — ABNORMAL HIGH (ref 11.5–15.5)
WBC: 16.8 10*3/uL — ABNORMAL HIGH (ref 4.0–10.5)
nRBC: 0.2 % (ref 0.0–0.2)

## 2019-11-16 LAB — COMPREHENSIVE METABOLIC PANEL
ALT: 111 U/L — ABNORMAL HIGH (ref 0–44)
AST: 75 U/L — ABNORMAL HIGH (ref 15–41)
Albumin: 3.7 g/dL (ref 3.5–5.0)
Alkaline Phosphatase: 177 U/L — ABNORMAL HIGH (ref 38–126)
Anion gap: 17 — ABNORMAL HIGH (ref 5–15)
BUN: 28 mg/dL — ABNORMAL HIGH (ref 6–20)
CO2: 24 mmol/L (ref 22–32)
Calcium: 8.9 mg/dL (ref 8.9–10.3)
Chloride: 98 mmol/L (ref 98–111)
Creatinine, Ser: 1.2 mg/dL (ref 0.61–1.24)
GFR calc Af Amer: >60 mL/min (ref >60)
GFR calc non Af Amer: >60 mL/min (ref >60)
Glucose, Bld: 169 mg/dL — ABNORMAL HIGH (ref 70–99)
Potassium: 4.7 mmol/L (ref 3.5–5.1)
Sodium: 139 mmol/L (ref 135–145)
Total Bilirubin: 1 mg/dL (ref 0.3–1.2)
Total Protein: 7.6 g/dL (ref 6.5–8.1)

## 2019-11-16 LAB — PHOSPHORUS: Phosphorus: 5.2 mg/dL — ABNORMAL HIGH (ref 2.5–4.6)

## 2019-11-16 LAB — D-DIMER, QUANTITATIVE: D-Dimer, Quant: 8.12 ug/mL-FEU — ABNORMAL HIGH (ref 0.00–0.50)

## 2019-11-16 LAB — MAGNESIUM: Magnesium: 2.6 mg/dL — ABNORMAL HIGH (ref 1.7–2.4)

## 2019-11-16 LAB — FERRITIN: Ferritin: 451 ng/mL — ABNORMAL HIGH (ref 24–336)

## 2019-11-16 LAB — GLUCOSE, CAPILLARY
Glucose-Capillary: 174 mg/dL — ABNORMAL HIGH (ref 70–99)
Glucose-Capillary: 193 mg/dL — ABNORMAL HIGH (ref 70–99)
Glucose-Capillary: 219 mg/dL — ABNORMAL HIGH (ref 70–99)

## 2019-11-16 LAB — C-REACTIVE PROTEIN: CRP: 1.3 mg/dL — ABNORMAL HIGH (ref <1.0)

## 2019-11-16 NOTE — Progress Notes (Signed)
PROGRESS NOTE  Nathaniel Webb ZHG:[id number] DOB: 12/21/1965 DOA: 11/11/2019 PCP: Rolm Gala, NP  HPI/Recap of past 68 hours: 54 year old male with past medical history of morbid obesity, hypertension and diabetes admitted on 12/29 after having 1 week of progressively worsening shortness of breath.  Found to be hypoxic and Covid positive.  Patient given Actemra, Remdisivir and IV steroids.  Also found to have an acute pulmonary embolus and on full dose Lovenox.  Patient has been slowly improving and is comfortable, but still requiring significant amount of oxygen.  Assessment/Plan: Principal Problem:   Acute respiratory failure with hypoxemia (HCC) secondary to pneumonia from COVID-19 as well as acute pulmonary embolus: Status post Actemra.  Just finished 5-day course of remdesivir.  Continue IV steroids.  Still requiring significant amount of oxygen, will attempt to wean down slowly.  Currently on full dose Lovenox.  Patient is self-pay, so when he is more stabilized, will change over to Coumadin.  We will check a BNP to ensure no underlying heart failure Active Problems:    Hypertension: Blood pressure stable.    Morbid obesity (HCC): Meets criteria with BMI greater than 35+ diabetes and hypertension.    Diabetes mellitus type 2, uncontrolled, with complications, including peripheral neuropathy (HCC): A1c at 7.3.  CBGs elevated at times, but overall stable  Elevated troponin: Likely elevated in the setting of acute pulmonary embolus and hypoxia.  No evidence of ischemia   Code Status: Full code  Family Communication: Left message for family  Disposition Plan: Home once able to wean down oxygen   Consultants:  None  Procedures:  Lower extreme Dopplers done 12/29: No evidence of DVT  Antimicrobials:  IV Remdisivir 12/29-1/2  IV Rocephin and Zithromax 12/29-1/2  DVT prophylaxis: Full dose Lovenox   Objective: Vitals:   11/16/19 1136 11/16/19 1732  BP:  133/88 121/86  Pulse: 95 98  Resp: (!) 22   Temp: 98.3 F (36.8 C) 98 F (36.7 C)  SpO2: 90% 92%    Intake/Output Summary (Last 24 hours) at 11/16/2019 1932 Last data filed at 11/16/2019 0547 Gross per 24 hour  Intake --  Output 1100 ml  Net -1100 ml   Filed Weights   11/11/19 1500  Weight: 106.1 kg   Body mass index is 38.94 kg/m.  Exam:   General: Alert and oriented x3, no acute distress  Cardiovascular: Regular rate and rhythm, S1-S2  Respiratory: Clear to auscultation bilaterally  Abdomen: Soft, nontender, nondistended, positive bowel sounds  Musculoskeletal: No clubbing or cyanosis or edema  Skin: No skin breaks, tears or lesions  Psychiatry: Appropriate, no evidence of psychoses  Neuro: No focal deficits   Data Reviewed: CBC: Recent Labs  Lab 11/12/19 0130 11/13/19 0120 11/14/19 0130 11/15/19 0307 11/16/19 0145  WBC 16.8* 13.7* 13.8* 14.6* 16.8*  NEUTROABS 14.0* 10.6* 10.4* 11.5* 13.5*  HGB 13.3 14.1 14.0 14.9 15.7  HCT 42.2 43.8 43.8 46.4 49.0  MCV 81.3 81.9 80.5 81.4 82.1  PLT 250 241 225 232 245   Basic Metabolic Panel: Recent Labs  Lab 11/12/19 0130 11/12/19 0400 11/13/19 0120 11/14/19 0130 11/15/19 0307 11/16/19 0145  NA 139  --  140 139 138 139  K 4.5  --  4.9 5.0 5.2* 4.7  CL 103  --  102 105 101 98  CO2 25  --  22 24 24 24   GLUCOSE 139*  --  137* 157* 164* 169*  BUN 38*  --  29* 24* 24* 28*  CREATININE 1.23  --  1.12 1.06 1.03 1.20  CALCIUM 7.9*  --  8.0* 7.9* 8.3* 8.9  MG  --  3.5* 3.3* 2.8* 2.8* 2.6*  PHOS  --  3.7 3.5 3.6 4.0 5.2*   GFR: Estimated Creatinine Clearance: 80.8 mL/min (by C-G formula based on SCr of 1.2 mg/dL). Liver Function Tests: Recent Labs  Lab 11/12/19 0130 11/13/19 0120 11/14/19 0130 11/15/19 0307 11/16/19 0145  AST 97* 102* 101* 96* 75*  ALT 84* 90* 89* 108* 111*  ALKPHOS 129* 161* 162* 192* 177*  BILITOT 1.1 0.9 0.8 0.9 1.0  PROT 7.5 7.4 6.9 7.2 7.6  ALBUMIN 2.9* 3.2* 3.0* 3.4* 3.7   No  results for input(s): LIPASE, AMYLASE in the last 168 hours. No results for input(s): AMMONIA in the last 168 hours. Coagulation Profile: No results for input(s): INR, PROTIME in the last 168 hours. Cardiac Enzymes: No results for input(s): CKTOTAL, CKMB, CKMBINDEX, TROPONINI in the last 168 hours. BNP (last 3 results) No results for input(s): PROBNP in the last 8760 hours. HbA1C: No results for input(s): HGBA1C in the last 72 hours. CBG: Recent Labs  Lab 11/15/19 1139 11/15/19 1648 11/15/19 2046 11/16/19 1139 11/16/19 1731  GLUCAP 128* 159* 302* 174* 193*   Lipid Profile: No results for input(s): CHOL, HDL, LDLCALC, TRIG, CHOLHDL, LDLDIRECT in the last 72 hours. Thyroid Function Tests: No results for input(s): TSH, T4TOTAL, FREET4, T3FREE, THYROIDAB in the last 72 hours. Anemia Panel: Recent Labs    11/15/19 0307 11/16/19 0500  FERRITIN 408* 451*   Urine analysis:    Component Value Date/Time   COLORURINE YELLOW (A) 07/08/2018 1113   APPEARANCEUR CLEAR (A) 07/08/2018 1113   LABSPEC 1.013 07/08/2018 1113   PHURINE 5.0 07/08/2018 1113   GLUCOSEU NEGATIVE 07/08/2018 1113   HGBUR NEGATIVE 07/08/2018 1113   BILIRUBINUR NEGATIVE 07/08/2018 1113   KETONESUR NEGATIVE 07/08/2018 1113   PROTEINUR NEGATIVE 07/08/2018 1113   NITRITE NEGATIVE 07/08/2018 1113   LEUKOCYTESUR NEGATIVE 07/08/2018 1113   Sepsis Labs: @LABRCNTIP (procalcitonin:4,lacticidven:4)  ) Recent Results (from the past 240 hour(s))  Blood Culture (routine x 2)     Status: None   Collection Time: 11/09/19 10:51 PM   Specimen: BLOOD LEFT HAND  Result Value Ref Range Status   Specimen Description BLOOD LEFT HAND  Final   Special Requests   Final    BOTTLES DRAWN AEROBIC AND ANAEROBIC Blood Culture adequate volume   Culture   Final    NO GROWTH 5 DAYS Performed at [facility], [address], [place], [place]    Report Status 11/14/2019 FINAL  Final  Blood Culture (routine x 2)      Status: None   Collection Time: 11/09/19 11:03 PM   Specimen: BLOOD  Result Value Ref Range Status   Specimen Description BLOOD RIGHT ANTECUBITAL  Final   Special Requests   Final    BOTTLES DRAWN AEROBIC AND ANAEROBIC Blood Culture adequate volume   Culture   Final    NO GROWTH 5 DAYS Performed at [facility], [address] [place], [place]    Report Status 11/14/2019 FINAL  Final  MRSA PCR Screening     Status: None   Collection Time: 11/10/19  6:42 AM   Specimen: Nasal Mucosa; Nasopharyngeal  Result Value Ref Range Status   MRSA by PCR NEGATIVE NEGATIVE Final    Comment:        The GeneXpert MRSA Assay (FDA approved for NASAL specimens only), is one component of  a comprehensive MRSA colonization surveillance program. It is not intended to diagnose MRSA infection nor to guide or monitor treatment for MRSA infections. Performed at [facility], [address] [place], [place] [address]   Culture, blood (Routine X 2) w Reflex to ID Panel     Status: None   Collection Time: 11/10/19  7:54 AM   Specimen: BLOOD  Result Value Ref Range Status   Specimen Description BLOOD BLOOD RIGHT FOREARM  Final   Special Requests   Final    BOTTLES DRAWN AEROBIC AND ANAEROBIC Blood Culture adequate volume   Culture   Final    NO GROWTH 5 DAYS Performed at [facility], [address], [place], [place] [address]    Report Status 11/15/2019 FINAL  Final  Culture, blood (Routine X 2) w Reflex to ID Panel     Status: None   Collection Time: 11/10/19  7:55 AM   Specimen: BLOOD  Result Value Ref Range Status   Specimen Description BLOOD BLOOD RIGHT HAND  Final   Special Requests   Final    BOTTLES DRAWN AEROBIC AND ANAEROBIC Blood Culture adequate volume   Culture   Final    NO GROWTH 5 DAYS Performed at [facility], [address], [place], [place] [address]    Report Status 11/15/2019 FINAL  Final      Studies: No results  found.  Scheduled Meds: . vitamin C  500 mg Oral Daily  . enoxaparin (LOVENOX) injection  1 mg/kg Subcutaneous Q12H  . insulin aspart  0-20 Units Subcutaneous TID WC  . methylPREDNISolone (SOLU-MEDROL) injection  0.5 mg/kg Intravenous Q12H  . sodium chloride flush  3 mL Intravenous Q12H  . zinc sulfate  220 mg Oral Daily    Continuous Infusions: . sodium chloride       LOS: 5 days     Annita Brod, MD [facility]  To reach me or the doctor on call, go to: [email or web address] Password [facility]  11/16/2019, 7:32 PM

## 2019-11-17 ENCOUNTER — Telehealth: Payer: Self-pay

## 2019-11-17 DIAGNOSIS — K5900 Constipation, unspecified: Secondary | ICD-10-CM | POA: Diagnosis not present

## 2019-11-17 LAB — COMPREHENSIVE METABOLIC PANEL
ALT: 91 U/L — ABNORMAL HIGH (ref 0–44)
AST: 50 U/L — ABNORMAL HIGH (ref 15–41)
Albumin: 3.5 g/dL (ref 3.5–5.0)
Alkaline Phosphatase: 135 U/L — ABNORMAL HIGH (ref 38–126)
Anion gap: 15 (ref 5–15)
BUN: 29 mg/dL — ABNORMAL HIGH (ref 6–20)
CO2: 23 mmol/L (ref 22–32)
Calcium: 8.9 mg/dL (ref 8.9–10.3)
Chloride: 99 mmol/L (ref 98–111)
Creatinine, Ser: 1.06 mg/dL (ref 0.61–1.24)
GFR calc Af Amer: >60 mL/min (ref >60)
GFR calc non Af Amer: >60 mL/min (ref >60)
Glucose, Bld: 152 mg/dL — ABNORMAL HIGH (ref 70–99)
Potassium: 4.7 mmol/L (ref 3.5–5.1)
Sodium: 137 mmol/L (ref 135–145)
Total Bilirubin: 0.8 mg/dL (ref 0.3–1.2)
Total Protein: 7.4 g/dL (ref 6.5–8.1)

## 2019-11-17 LAB — GLUCOSE, CAPILLARY
Glucose-Capillary: 134 mg/dL — ABNORMAL HIGH (ref 70–99)
Glucose-Capillary: 196 mg/dL — ABNORMAL HIGH (ref 70–99)
Glucose-Capillary: 196 mg/dL — ABNORMAL HIGH (ref 70–99)
Glucose-Capillary: 237 mg/dL — ABNORMAL HIGH (ref 70–99)
Glucose-Capillary: 148 mg/dL — ABNORMAL HIGH (ref 70–99)
Glucose-Capillary: 188 mg/dL — ABNORMAL HIGH (ref 70–99)
Glucose-Capillary: 237 mg/dL — ABNORMAL HIGH (ref 70–99)

## 2019-11-17 LAB — D-DIMER, QUANTITATIVE: D-Dimer, Quant: 3.49 ug/mL-FEU — ABNORMAL HIGH (ref 0.00–0.50)

## 2019-11-17 LAB — CBC WITH DIFFERENTIAL/PLATELET
Abs Immature Granulocytes: 0.66 10*3/uL — ABNORMAL HIGH (ref 0.00–0.07)
Basophils Absolute: 0 10*3/uL (ref 0.0–0.1)
Basophils Relative: 0 %
Eosinophils Absolute: 0 10*3/uL (ref 0.0–0.5)
Eosinophils Relative: 0 %
HCT: 47.6 % (ref 39.0–52.0)
Hemoglobin: 15.2 g/dL (ref 13.0–17.0)
Immature Granulocytes: 3 %
Lymphocytes Relative: 7 %
Lymphs Abs: 1.6 10*3/uL (ref 0.7–4.0)
MCH: 26 pg (ref 26.0–34.0)
MCHC: 31.9 g/dL (ref 30.0–36.0)
MCV: 81.5 fL (ref 80.0–100.0)
Monocytes Absolute: 1 10*3/uL (ref 0.1–1.0)
Monocytes Relative: 4 %
Neutro Abs: 19.5 10*3/uL — ABNORMAL HIGH (ref 1.7–7.7)
Neutrophils Relative %: 86 %
Platelets: 255 10*3/uL (ref 150–400)
RBC: 5.84 MIL/uL — ABNORMAL HIGH (ref 4.22–5.81)
RDW: 16 % — ABNORMAL HIGH (ref 11.5–15.5)
WBC: 22.8 10*3/uL — ABNORMAL HIGH (ref 4.0–10.5)
nRBC: 0 % (ref 0.0–0.2)

## 2019-11-17 LAB — MAGNESIUM: Magnesium: 2.6 mg/dL — ABNORMAL HIGH (ref 1.7–2.4)

## 2019-11-17 LAB — C-REACTIVE PROTEIN: CRP: <0.5 mg/dL (ref <1.0)

## 2019-11-17 LAB — BRAIN NATRIURETIC PEPTIDE: B Natriuretic Peptide: 23.9 pg/mL (ref 0.0–100.0)

## 2019-11-17 LAB — PHOSPHORUS: Phosphorus: 4.1 mg/dL (ref 2.5–4.6)

## 2019-11-17 LAB — FERRITIN: Ferritin: 437 ng/mL — ABNORMAL HIGH (ref 24–336)

## 2019-11-17 MED ORDER — POLYETHYLENE GLYCOL 3350 17 G PO PACK
17.0000 g | PACK | ORAL | Status: AC
  Administered 2019-11-17: 17 g via ORAL
  Filled 2019-11-17: qty 1

## 2019-11-17 MED ORDER — WARFARIN - PHARMACIST DOSING INPATIENT: Freq: Every day | Status: DC

## 2019-11-17 MED ORDER — LACTULOSE 10 GM/15ML PO SOLN
10.0000 g | Freq: Once | ORAL | Status: AC
  Administered 2019-11-17: 10 g via ORAL
  Filled 2019-11-17: qty 15

## 2019-11-17 MED ORDER — DOCUSATE SODIUM 100 MG PO CAPS
100.0000 mg | ORAL_CAPSULE | Freq: Two times a day (BID) | ORAL | Status: DC
  Administered 2019-11-17 – 2019-11-26 (×16): 100 mg via ORAL
  Filled 2019-11-17 (×15): qty 1

## 2019-11-17 MED ORDER — WARFARIN SODIUM 7.5 MG PO TABS
7.5000 mg | ORAL_TABLET | Freq: Once | ORAL | Status: AC
  Administered 2019-11-17: 7.5 mg via ORAL
  Filled 2019-11-17: qty 1

## 2019-11-17 NOTE — Telephone Encounter (Signed)
Patients colonoscopy has been canceled for Friday 11/21/19 due to hospitalization at [facility] with COVID.  Thanks [employer]

## 2019-11-17 NOTE — Progress Notes (Signed)
ANTICOAGULATION CONSULT NOTE - Follow Up Consult  Pharmacy Consult for Lovenox / Warfarin Indication: pulmonary embolus  No Known Allergies  Patient Measurements: Height: 5\' 5"  (165.1 cm) Weight: 234 lb (106.1 kg) IBW/kg (Calculated) : 61.5   Vital Signs: Temp: 98.3 F (36.8 C) (01/04 0814) Temp Source: Axillary (01/04 0814) BP: 126/91 (01/04 1133) Pulse Rate: 87 (01/04 1133)  Labs: Recent Labs    11/15/19 0307 11/16/19 0145 11/17/19 0200  HGB 14.9 15.7 15.2  HCT 46.4 49.0 47.6  PLT 232 245 255  CREATININE 1.03 1.20 1.06    Estimated Creatinine Clearance: 91.4 mL/min (by C-G formula based on SCr of 1.06 mg/dL).   Assessment: 54 year old male with new PE already on Lovenox to begin warfarin Day 1/5 of overlap  Goal of Therapy:  INR 2-3 Monitor platelets by anticoagulation protocol: Yes   Plan:  Warfarin 7.5 mg po x 1 dose tonight Continue Lovenox 105 mg sq Q 12 hours Daily INR  Thank you 44, PharmD  11/17/2019,2:20 PM

## 2019-11-17 NOTE — Progress Notes (Signed)
Phoned patients spouse, Margaretha Glassing, and updated her on patient status. Answered most of her questions, deferred the ones I could not answer for the physician in the a.m., and left the phone number so she could contact staff if she had anything further to ask.

## 2019-11-17 NOTE — Progress Notes (Signed)
PROGRESS NOTE  ARIES TOWNLEY UUV:[id number] DOB: 11/20/1965 DOA: 11/11/2019 PCP: Langston Reusing, NP  HPI/Recap of past 45 hours: 54 year old male with past medical history of morbid obesity, hypertension and diabetes admitted on 12/29 after having 1 week of progressively worsening shortness of breath.  Found to be hypoxic and Covid positive.  Patient given Actemra, Remdisivir and IV steroids.  Also found to have an acute pulmonary embolus and on full dose Lovenox.  Patient has been slowly improving and is comfortable, but still requiring significant amount of oxygen.  Working on Careers adviser.  Patient complains of constipation.  Assessment/Plan: Principal Problem:   Acute respiratory failure with hypoxemia (HCC) secondary to pneumonia from COVID-19 as well as acute pulmonary embolus: Status post Actemra.  Just finished 5-day course of remdesivir.  Continue IV steroids.  Still requiring significant amount of oxygen, will attempt to wean down slowly.  Currently on full dose Lovenox.  Patient is self-pay, so when he is more stabilized, will change over to Coumadin.  BNP unremarkable.  Active Problems:  Constipation: Patient states has not had a bowel movement now in almost a week.  We will start with MiraLAX.  If this does not work well, will try something stronger.    Hypertension: Blood pressure stable.    Morbid obesity ([place]): Meets criteria with BMI greater than 35+ diabetes and hypertension.    Diabetes mellitus type 2, uncontrolled, with complications, including peripheral neuropathy ([place]): A1c at 7.3.  CBGs elevated at times, but overall stable  Elevated troponin: Likely elevated in the setting of acute pulmonary embolus and hypoxia.  No evidence of ischemia   Code Status: Full code  Family Communication: Left message for family  Disposition Plan: Home once able to wean down oxygen   Consultants:  None  Procedures:  Lower extremity Dopplers done 12/29: No evidence of  DVT  Antimicrobials:  IV Remdisivir 12/29-1/2  IV Rocephin and Zithromax 12/29-1/2  DVT prophylaxis: Full dose Lovenox   Objective: Vitals:   11/17/19 0814 11/17/19 1133  BP: 107/69 (!) 126/91  Pulse: 85 87  Resp: (!) 22 18  Temp: 98.3 F (36.8 C)   SpO2: 96% 95%    Intake/Output Summary (Last 24 hours) at 11/17/2019 1456 Last data filed at 11/17/2019 1400 Gross per 24 hour  Intake --  Output 1300 ml  Net -1300 ml   Filed Weights   11/11/19 1500  Weight: 106.1 kg   Body mass index is 38.94 kg/m.  Exam:   General: Alert and oriented x3, mild distress from constipation  Cardiovascular: Regular rate and rhythm, S1-S2  Respiratory: Clear to auscultation bilaterally  Abdomen: Soft, nontender, mild distention, positive bowel sounds  Musculoskeletal: No clubbing or cyanosis or edema  Skin: No skin breaks, tears or lesions  Psychiatry: Appropriate, no evidence of psychoses  Neuro: No focal deficits   Data Reviewed: CBC: Recent Labs  Lab 11/13/19 0120 11/14/19 0130 11/15/19 0307 11/16/19 0145 11/17/19 0200  WBC 13.7* 13.8* 14.6* 16.8* 22.8*  NEUTROABS 10.6* 10.4* 11.5* 13.5* 19.5*  HGB 14.1 14.0 14.9 15.7 15.2  HCT 43.8 43.8 46.4 49.0 47.6  MCV 81.9 80.5 81.4 82.1 81.5  PLT 241 225 232 245 474   Basic Metabolic Panel: Recent Labs  Lab 11/13/19 0120 11/14/19 0130 11/15/19 0307 11/16/19 0145 11/17/19 0200  NA 140 139 138 139 137  K 4.9 5.0 5.2* 4.7 4.7  CL 102 105 101 98 99  CO2 22 24 24 24 23   GLUCOSE 137* 157*  164* 169* 152*  BUN 29* 24* 24* 28* 29*  CREATININE 1.12 1.06 1.03 1.20 1.06  CALCIUM 8.0* 7.9* 8.3* 8.9 8.9  MG 3.3* 2.8* 2.8* 2.6* 2.6*  PHOS 3.5 3.6 4.0 5.2* 4.1   GFR: Estimated Creatinine Clearance: 91.4 mL/min (by C-G formula based on SCr of 1.06 mg/dL). Liver Function Tests: Recent Labs  Lab 11/13/19 0120 11/14/19 0130 11/15/19 0307 11/16/19 0145 11/17/19 0200  AST 102* 101* 96* 75* 50*  ALT 90* 89* 108* 111* 91*   ALKPHOS 161* 162* 192* 177* 135*  BILITOT 0.9 0.8 0.9 1.0 0.8  PROT 7.4 6.9 7.2 7.6 7.4  ALBUMIN 3.2* 3.0* 3.4* 3.7 3.5   No results for input(s): LIPASE, AMYLASE in the last 168 hours. No results for input(s): AMMONIA in the last 168 hours. Coagulation Profile: No results for input(s): INR, PROTIME in the last 168 hours. Cardiac Enzymes: No results for input(s): CKTOTAL, CKMB, CKMBINDEX, TROPONINI in the last 168 hours. BNP (last 3 results) No results for input(s): PROBNP in the last 8760 hours. HbA1C: No results for input(s): HGBA1C in the last 72 hours. CBG: Recent Labs  Lab 11/16/19 1139 11/16/19 1731 11/16/19 2122 11/17/19 0811 11/17/19 1228  GLUCAP 174* 193* 219* 237* 148*   Lipid Profile: No results for input(s): CHOL, HDL, LDLCALC, TRIG, CHOLHDL, LDLDIRECT in the last 72 hours. Thyroid Function Tests: No results for input(s): TSH, T4TOTAL, FREET4, T3FREE, THYROIDAB in the last 72 hours. Anemia Panel: Recent Labs    11/16/19 0500 11/17/19 0200  FERRITIN 451* 437*   Urine analysis:    Component Value Date/Time   COLORURINE YELLOW (A) 07/08/2018 1113   APPEARANCEUR CLEAR (A) 07/08/2018 1113   LABSPEC 1.013 07/08/2018 1113   PHURINE 5.0 07/08/2018 1113   GLUCOSEU NEGATIVE 07/08/2018 1113   HGBUR NEGATIVE 07/08/2018 1113   BILIRUBINUR NEGATIVE 07/08/2018 1113   KETONESUR NEGATIVE 07/08/2018 1113   PROTEINUR NEGATIVE 07/08/2018 1113   NITRITE NEGATIVE 07/08/2018 1113   LEUKOCYTESUR NEGATIVE 07/08/2018 1113   Sepsis Labs: @LABRCNTIP (procalcitonin:4,lacticidven:4)  ) Recent Results (from the past 240 hour(s))  Blood Culture (routine x 2)     Status: None   Collection Time: 11/09/19 10:51 PM   Specimen: BLOOD LEFT HAND  Result Value Ref Range Status   Specimen Description BLOOD LEFT HAND  Final   Special Requests   Final    BOTTLES DRAWN AEROBIC AND ANAEROBIC Blood Culture adequate volume   Culture   Final    NO GROWTH 5 DAYS Performed at [facility], [address], [place], [place]    Report Status 11/14/2019 FINAL  Final  Blood Culture (routine x 2)     Status: None   Collection Time: 11/09/19 11:03 PM   Specimen: BLOOD  Result Value Ref Range Status   Specimen Description BLOOD RIGHT ANTECUBITAL  Final   Special Requests   Final    BOTTLES DRAWN AEROBIC AND ANAEROBIC Blood Culture adequate volume   Culture   Final    NO GROWTH 5 DAYS Performed at [facility], [address] [place], [place]    Report Status 11/14/2019 FINAL  Final  MRSA PCR Screening     Status: None   Collection Time: 11/10/19  6:42 AM   Specimen: Nasal Mucosa; Nasopharyngeal  Result Value Ref Range Status   MRSA by PCR NEGATIVE NEGATIVE Final    Comment:        The GeneXpert MRSA Assay (FDA approved for NASAL specimens only),  is one component of a comprehensive MRSA colonization surveillance program. It is not intended to diagnose MRSA infection nor to guide or monitor treatment for MRSA infections. Performed at [facility], [address] [place], [place] [address]   Culture, blood (Routine X 2) w Reflex to ID Panel     Status: None   Collection Time: 11/10/19  7:54 AM   Specimen: BLOOD  Result Value Ref Range Status   Specimen Description BLOOD BLOOD RIGHT FOREARM  Final   Special Requests   Final    BOTTLES DRAWN AEROBIC AND ANAEROBIC Blood Culture adequate volume   Culture   Final    NO GROWTH 5 DAYS Performed at [facility], [address], [place], [place] [address]    Report Status 11/15/2019 FINAL  Final  Culture, blood (Routine X 2) w Reflex to ID Panel     Status: None   Collection Time: 11/10/19  7:55 AM   Specimen: BLOOD  Result Value Ref Range Status   Specimen Description BLOOD BLOOD RIGHT HAND  Final   Special Requests   Final    BOTTLES DRAWN AEROBIC AND ANAEROBIC Blood Culture adequate volume   Culture   Final    NO GROWTH 5 DAYS Performed at [facility], [address], [place], [place] [address]    Report Status 11/15/2019 FINAL  Final      Studies: No results found.  Scheduled Meds: . vitamin C  500 mg Oral Daily  . enoxaparin (LOVENOX) injection  1 mg/kg Subcutaneous Q12H  . insulin aspart  0-20 Units Subcutaneous TID WC  . methylPREDNISolone (SOLU-MEDROL) injection  0.5 mg/kg Intravenous Q12H  . sodium chloride flush  3 mL Intravenous Q12H  . warfarin  7.5 mg Oral ONCE-1800  . Warfarin - Pharmacist Dosing Inpatient   Does not apply q1800  . zinc sulfate  220 mg Oral Daily    Continuous Infusions: . sodium chloride       LOS: 6 days     Hollice Espy, MD [facility]  To reach me or the doctor on call, go to: [email or web address] Password [facility]  11/17/2019, 2:56 PM

## 2019-11-17 NOTE — Progress Notes (Signed)
Removed NRB from patient and titrated O2 to 15 LHFNC. Patient maintaining O2 sats at 89-94%. Will continue to wean when possible.

## 2019-11-18 ENCOUNTER — Ambulatory Visit: Payer: No Typology Code available for payment source | Admitting: Podiatry

## 2019-11-18 LAB — CBC
HCT: 48.2 % (ref 39.0–52.0)
Hemoglobin: 15.4 g/dL (ref 13.0–17.0)
MCH: 25.9 pg — ABNORMAL LOW (ref 26.0–34.0)
MCHC: 32 g/dL (ref 30.0–36.0)
MCV: 81.1 fL (ref 80.0–100.0)
Platelets: 273 10*3/uL (ref 150–400)
RBC: 5.94 MIL/uL — ABNORMAL HIGH (ref 4.22–5.81)
RDW: 15.1 % (ref 11.5–15.5)
WBC: 21.6 10*3/uL — ABNORMAL HIGH (ref 4.0–10.5)
nRBC: 0 % (ref 0.0–0.2)

## 2019-11-18 LAB — PROCALCITONIN: Procalcitonin: <0.1 ng/mL

## 2019-11-18 LAB — PROTIME-INR
INR: 1.2 (ref 0.8–1.2)
Prothrombin Time: 14.6 seconds (ref 11.4–15.2)

## 2019-11-18 LAB — MAGNESIUM: Magnesium: 2.5 mg/dL — ABNORMAL HIGH (ref 1.7–2.4)

## 2019-11-18 LAB — GLUCOSE, CAPILLARY
Glucose-Capillary: 197 mg/dL — ABNORMAL HIGH (ref 70–99)
Glucose-Capillary: 214 mg/dL — ABNORMAL HIGH (ref 70–99)
Glucose-Capillary: 260 mg/dL — ABNORMAL HIGH (ref 70–99)
Glucose-Capillary: 277 mg/dL — ABNORMAL HIGH (ref 70–99)

## 2019-11-18 LAB — PHOSPHORUS: Phosphorus: 3.5 mg/dL (ref 2.5–4.6)

## 2019-11-18 MED ORDER — WARFARIN SODIUM 7.5 MG PO TABS
7.5000 mg | ORAL_TABLET | Freq: Once | ORAL | Status: AC
  Administered 2019-11-18: 7.5 mg via ORAL
  Filled 2019-11-18: qty 1

## 2019-11-18 MED ORDER — LACTULOSE 10 GM/15ML PO SOLN
20.0000 g | ORAL | Status: AC
  Administered 2019-11-18: 20 g via ORAL
  Filled 2019-11-18: qty 30

## 2019-11-18 NOTE — Progress Notes (Signed)
ANTICOAGULATION CONSULT NOTE - Follow Up Consult  Pharmacy Consult for Lovenox / Warfarin Indication: pulmonary embolus  No Known Allergies  Patient Measurements: Height: 5\' 5"  (165.1 cm) Weight: 234 lb (106.1 kg) IBW/kg (Calculated) : 61.5   Vital Signs: Temp: 97.7 F (36.5 C) (01/05 0723) Temp Source: Oral (01/05 0723) BP: 129/86 (01/05 0723) Pulse Rate: 98 (01/05 0723)  Labs: Recent Labs    11/16/19 0145 11/17/19 0200  HGB 15.7 15.2  HCT 49.0 47.6  PLT 245 255  CREATININE 1.20 1.06    Estimated Creatinine Clearance: 91.4 mL/min (by C-G formula based on SCr of 1.06 mg/dL).   Assessment: 54 year old male with new PE already on Lovenox to begin warfarin Day 2/5 of overlap INR 1.2  Goal of Therapy:  INR 2-3 Monitor platelets by anticoagulation protocol: Yes   Plan:  Warfarin 7.5 mg po x 1 dose tonight Continue Lovenox 105 mg sq Q 12 hours Daily INR  Thank you 44, PharmD, BCPS 11/18/2019,10:12 AM

## 2019-11-18 NOTE — Progress Notes (Signed)
PROGRESS NOTE  Nathaniel Webb DTO:[id number] DOB: 1966-02-01 DOA: 11/11/2019 PCP: Langston Reusing, NP  HPI/Recap of past 25 hours: 54 year old male with past medical history of morbid obesity, hypertension and diabetes admitted on 12/29 after having 1 week of progressively worsening shortness of breath.  Found to be hypoxic and Covid positive.  Patient given Actemra, Remdisivir and IV steroids.  Also found to have an acute pulmonary embolus and on full dose Lovenox.  Patient has been slowly improving and is comfortable, but still requiring significant amount of oxygen.   Has been having problems with constipation of the last 24 hours and no response to initial dose of lactulose.  Give higher dose of lactulose this morning and finally, patient has responded with bowel movements this afternoon and feeling better.  Able to be weaned down to 13 L high flow  Assessment/Plan: Principal Problem:   Acute respiratory failure with hypoxemia (HCC) secondary to pneumonia from COVID-19 as well as acute pulmonary embolus: Status post Actemra.  Just finished 5-day course of remdesivir.  Continue IV steroids.  Still requiring significant amount of oxygen, and so we are just now able to start weaning down slowly.  Currently on full dose Lovenox.  Patient is self-pay, so when he is more stabilized, will change over to Coumadin.  BNP unremarkable.  Active Problems:  Constipation: Patient states has not had a bowel movement now in almost a week.  Responded well to lactulose.  Would continue this as needed if he is not able to have bowel movement every 24-48 hours.    Hypertension: Blood pressure stable.    Morbid obesity ([place]): Meets criteria with BMI greater than 35+ diabetes and hypertension.    Diabetes mellitus type 2, uncontrolled, with complications, including peripheral neuropathy ([place]): A1c at 7.3.  CBGs elevated at times, but overall stable  Elevated troponin: Likely elevated in the setting of  acute pulmonary embolus and hypoxia.  No evidence of ischemia   Code Status: Full code  Family Communication: Updated wife by phone  Disposition Plan: Home once able to wean down oxygen   Consultants:  None  Procedures:  Lower extremity Dopplers done 12/29: No evidence of DVT  Antimicrobials:  IV Remdisivir 12/29-1/2  IV Rocephin and Zithromax 12/29-1/2  DVT prophylaxis: Full dose Lovenox   Objective: Vitals:   11/18/19 0723 11/18/19 0838  BP: 129/86   Pulse: 98 94  Resp: (!) 22 (!) 24  Temp: 97.7 F (36.5 C)   SpO2: 93% 92%    Intake/Output Summary (Last 24 hours) at 11/18/2019 1544 Last data filed at 11/18/2019 0930 Gross per 24 hour  Intake 593 ml  Output 700 ml  Net -107 ml   Filed Weights   11/11/19 1500  Weight: 106.1 kg   Body mass index is 38.94 kg/m.  Exam:   General: Alert and oriented x3, no acute distress  Cardiovascular: Regular rate and rhythm, S1-S2  Respiratory: Clear to auscultation bilaterally  Abdomen: Soft, nontender, mild distention, positive bowel sounds  Musculoskeletal: No clubbing or cyanosis or edema  Skin: No skin breaks, tears or lesions  Psychiatry: Appropriate, no evidence of psychoses  Neuro: No focal deficits   Data Reviewed: CBC: Recent Labs  Lab 11/13/19 0120 11/14/19 0130 11/15/19 0307 11/16/19 0145 11/17/19 0200  WBC 13.7* 13.8* 14.6* 16.8* 22.8*  NEUTROABS 10.6* 10.4* 11.5* 13.5* 19.5*  HGB 14.1 14.0 14.9 15.7 15.2  HCT 43.8 43.8 46.4 49.0 47.6  MCV 81.9 80.5 81.4 82.1 81.5  PLT  241 225 232 245 255   Basic Metabolic Panel: Recent Labs  Lab 11/13/19 0120 11/14/19 0130 11/15/19 0307 11/16/19 0145 11/17/19 0200 11/18/19 0150  NA 140 139 138 139 137  --   K 4.9 5.0 5.2* 4.7 4.7  --   CL 102 105 101 98 99  --   CO2 22 24 24 24 23   --   GLUCOSE 137* 157* 164* 169* 152*  --   BUN 29* 24* 24* 28* 29*  --   CREATININE 1.12 1.06 1.03 1.20 1.06  --   CALCIUM 8.0* 7.9* 8.3* 8.9 8.9  --   MG  3.3* 2.8* 2.8* 2.6* 2.6* 2.5*  PHOS 3.5 3.6 4.0 5.2* 4.1 3.5   GFR: Estimated Creatinine Clearance: 91.4 mL/min (by C-G formula based on SCr of 1.06 mg/dL). Liver Function Tests: Recent Labs  Lab 11/13/19 0120 11/14/19 0130 11/15/19 0307 11/16/19 0145 11/17/19 0200  AST 102* 101* 96* 75* 50*  ALT 90* 89* 108* 111* 91*  ALKPHOS 161* 162* 192* 177* 135*  BILITOT 0.9 0.8 0.9 1.0 0.8  PROT 7.4 6.9 7.2 7.6 7.4  ALBUMIN 3.2* 3.0* 3.4* 3.7 3.5   No results for input(s): LIPASE, AMYLASE in the last 168 hours. No results for input(s): AMMONIA in the last 168 hours. Coagulation Profile: Recent Labs  Lab 11/18/19 1058  INR 1.2   Cardiac Enzymes: No results for input(s): CKTOTAL, CKMB, CKMBINDEX, TROPONINI in the last 168 hours. BNP (last 3 results) No results for input(s): PROBNP in the last 8760 hours. HbA1C: No results for input(s): HGBA1C in the last 72 hours. CBG: Recent Labs  Lab 11/17/19 1228 11/17/19 1700 11/17/19 2049 11/18/19 0742 11/18/19 1114  GLUCAP 148* 188* 237* 197* 214*   Lipid Profile: No results for input(s): CHOL, HDL, LDLCALC, TRIG, CHOLHDL, LDLDIRECT in the last 72 hours. Thyroid Function Tests: No results for input(s): TSH, T4TOTAL, FREET4, T3FREE, THYROIDAB in the last 72 hours. Anemia Panel: Recent Labs    11/16/19 0500 11/17/19 0200  FERRITIN 451* 437*   Urine analysis:    Component Value Date/Time   COLORURINE YELLOW (A) 07/08/2018 1113   APPEARANCEUR CLEAR (A) 07/08/2018 1113   LABSPEC 1.013 07/08/2018 1113   PHURINE 5.0 07/08/2018 1113   GLUCOSEU NEGATIVE 07/08/2018 1113   HGBUR NEGATIVE 07/08/2018 1113   BILIRUBINUR NEGATIVE 07/08/2018 1113   KETONESUR NEGATIVE 07/08/2018 1113   PROTEINUR NEGATIVE 07/08/2018 1113   NITRITE NEGATIVE 07/08/2018 1113   LEUKOCYTESUR NEGATIVE 07/08/2018 1113   Sepsis Labs: @LABRCNTIP (procalcitonin:4,lacticidven:4)  ) Recent Results (from the past 240 hour(s))  Blood Culture (routine x 2)      Status: None   Collection Time: 11/09/19 10:51 PM   Specimen: BLOOD LEFT HAND  Result Value Ref Range Status   Specimen Description BLOOD LEFT HAND  Final   Special Requests   Final    BOTTLES DRAWN AEROBIC AND ANAEROBIC Blood Culture adequate volume   Culture   Final    NO GROWTH 5 DAYS Performed at [facility], [address], [place], [address] [place]    Report Status 11/14/2019 FINAL  Final  Blood Culture (routine x 2)     Status: None   Collection Time: 11/09/19 11:03 PM   Specimen: BLOOD  Result Value Ref Range Status   Specimen Description BLOOD RIGHT ANTECUBITAL  Final   Special Requests   Final    BOTTLES DRAWN AEROBIC AND ANAEROBIC Blood Culture adequate volume   Culture   Final  NO GROWTH 5 DAYS Performed at [facility], [address] [place], [place] [address]    Report Status 11/14/2019 FINAL  Final  MRSA PCR Screening     Status: None   Collection Time: 11/10/19  6:42 AM   Specimen: Nasal Mucosa; Nasopharyngeal  Result Value Ref Range Status   MRSA by PCR NEGATIVE NEGATIVE Final    Comment:        The GeneXpert MRSA Assay (FDA approved for NASAL specimens only), is one component of a comprehensive MRSA colonization surveillance program. It is not intended to diagnose MRSA infection nor to guide or monitor treatment for MRSA infections. Performed at [facility], [address] [place], [place] [address]   Culture, blood (Routine X 2) w Reflex to ID Panel     Status: None   Collection Time: 11/10/19  7:54 AM   Specimen: BLOOD  Result Value Ref Range Status   Specimen Description BLOOD BLOOD RIGHT FOREARM  Final   Special Requests   Final    BOTTLES DRAWN AEROBIC AND ANAEROBIC Blood Culture adequate volume   Culture   Final    NO GROWTH 5 DAYS Performed at [facility], [address] [place], [place] [address]    Report Status 11/15/2019 FINAL  Final  Culture, blood (Routine X 2) w Reflex to ID Panel      Status: None   Collection Time: 11/10/19  7:55 AM   Specimen: BLOOD  Result Value Ref Range Status   Specimen Description BLOOD BLOOD RIGHT HAND  Final   Special Requests   Final    BOTTLES DRAWN AEROBIC AND ANAEROBIC Blood Culture adequate volume   Culture   Final    NO GROWTH 5 DAYS Performed at [facility], [address], [place], [place] [address]    Report Status 11/15/2019 FINAL  Final      Studies: No results found.  Scheduled Meds: . vitamin C  500 mg Oral Daily  . docusate sodium  100 mg Oral BID  . enoxaparin (LOVENOX) injection  1 mg/kg Subcutaneous Q12H  . insulin aspart  0-20 Units Subcutaneous TID WC  . methylPREDNISolone (SOLU-MEDROL) injection  0.5 mg/kg Intravenous Q12H  . sodium chloride flush  3 mL Intravenous Q12H  . warfarin  7.5 mg Oral ONCE-1800  . Warfarin - Pharmacist Dosing Inpatient   Does not apply q1800  . zinc sulfate  220 mg Oral Daily    Continuous Infusions: . sodium chloride       LOS: 7 days     Hollice Espy, MD [facility]  To reach me or the doctor on call, go to: [email or web address] Password [facility]  11/18/2019, 3:44 PM

## 2019-11-18 NOTE — Plan of Care (Signed)
  Problem: Education: Goal: Knowledge of risk factors and measures for prevention of condition will improve Outcome: Progressing   Problem: Coping: Goal: Psychosocial and spiritual needs will be supported Outcome: Progressing   Problem: Respiratory: Goal: Will maintain a patent airway Outcome: Progressing Goal: Complications related to the disease process, condition or treatment will be avoided or minimized Outcome: Progressing   

## 2019-11-19 LAB — MAGNESIUM: Magnesium: 2.7 mg/dL — ABNORMAL HIGH (ref 1.7–2.4)

## 2019-11-19 LAB — CBC
HCT: 47.2 % (ref 39.0–52.0)
Hemoglobin: 15.3 g/dL (ref 13.0–17.0)
MCH: 26.5 pg (ref 26.0–34.0)
MCHC: 32.4 g/dL (ref 30.0–36.0)
MCV: 81.8 fL (ref 80.0–100.0)
Platelets: 241 10*3/uL (ref 150–400)
RBC: 5.77 MIL/uL (ref 4.22–5.81)
RDW: 15.3 % (ref 11.5–15.5)
WBC: 21.3 10*3/uL — ABNORMAL HIGH (ref 4.0–10.5)
nRBC: 0 % (ref 0.0–0.2)

## 2019-11-19 LAB — BASIC METABOLIC PANEL
Anion gap: 14 (ref 5–15)
BUN: 33 mg/dL — ABNORMAL HIGH (ref 6–20)
CO2: 25 mmol/L (ref 22–32)
Calcium: 8.9 mg/dL (ref 8.9–10.3)
Chloride: 98 mmol/L (ref 98–111)
Creatinine, Ser: 1.02 mg/dL (ref 0.61–1.24)
GFR calc Af Amer: >60 mL/min (ref >60)
GFR calc non Af Amer: >60 mL/min (ref >60)
Glucose, Bld: 182 mg/dL — ABNORMAL HIGH (ref 70–99)
Potassium: 4.9 mmol/L (ref 3.5–5.1)
Sodium: 137 mmol/L (ref 135–145)

## 2019-11-19 LAB — GLUCOSE, CAPILLARY
Glucose-Capillary: 192 mg/dL — ABNORMAL HIGH (ref 70–99)
Glucose-Capillary: 182 mg/dL — ABNORMAL HIGH (ref 70–99)
Glucose-Capillary: 213 mg/dL — ABNORMAL HIGH (ref 70–99)
Glucose-Capillary: 239 mg/dL — ABNORMAL HIGH (ref 70–99)

## 2019-11-19 LAB — PROTIME-INR
INR: 1.5 — ABNORMAL HIGH (ref 0.8–1.2)
Prothrombin Time: 18.4 seconds — ABNORMAL HIGH (ref 11.4–15.2)

## 2019-11-19 LAB — PHOSPHORUS: Phosphorus: 3.8 mg/dL (ref 2.5–4.6)

## 2019-11-19 MED ORDER — WARFARIN SODIUM 7.5 MG PO TABS
7.5000 mg | ORAL_TABLET | Freq: Once | ORAL | Status: AC
  Administered 2019-11-19: 7.5 mg via ORAL
  Filled 2019-11-19: qty 1

## 2019-11-19 NOTE — Progress Notes (Signed)
Spoke with patients wife, Margaretha Glassing on the phone. Updated her on patient status and she did not have any question at this time.

## 2019-11-19 NOTE — Progress Notes (Signed)
ANTICOAGULATION CONSULT NOTE - Follow Up Consult  Pharmacy Consult for Lovenox / Warfarin Indication: pulmonary embolus  No Known Allergies  Patient Measurements: Height: 5\' 5"  (165.1 cm) Weight: 234 lb (106.1 kg) IBW/kg (Calculated) : 61.5   Vital Signs: Temp: 98.4 F (36.9 C) (01/06 0800) Temp Source: Oral (01/06 0800) BP: 140/98 (01/06 0800) Pulse Rate: 87 (01/06 0800)  Labs: Recent Labs    11/17/19 0200 11/18/19 1058 11/18/19 1611 11/19/19 0238  HGB 15.2  --  15.4 15.3  HCT 47.6  --  48.2 47.2  PLT 255  --  273 241  LABPROT  --  14.6  --  18.4*  INR  --  1.2  --  1.5*  CREATININE 1.06  --   --  1.02    Estimated Creatinine Clearance: 95 mL/min (by C-G formula based on SCr of 1.02 mg/dL).   Assessment: 54 year old male with new PE already on Lovenox to begin warfarin Day 3/5 of overlap INR 1.5  Goal of Therapy:  INR 2-3 Monitor platelets by anticoagulation protocol: Yes   Plan:  Warfarin 7.5 mg po x 1 dose tonight Continue Lovenox 105 mg sq Q 12 hours Daily INR  Thank you 44, PharmD, BCPS 11/19/2019,11:08 AM

## 2019-11-19 NOTE — Plan of Care (Signed)
  Problem: Education: Goal: Knowledge of risk factors and measures for prevention of condition will improve Outcome: Progressing   Problem: Coping: Goal: Psychosocial and spiritual needs will be supported Outcome: Progressing   Problem: Respiratory: Goal: Will maintain a patent airway Outcome: Not Met (Pt is still on 15L HFNC)

## 2019-11-19 NOTE — Progress Notes (Addendum)
PROGRESS NOTE    Nathaniel Webb  VHQ:[id number] DOB: 03/14/1966 DOA: 11/11/2019 PCP: Langston Reusing, NP   Brief Narrative:  54 year old BM PMHx diabetes type 2 uncontrolled with complication/diabetic neuropathy,, HTN, HLD, obesity  Presented to [facility] with approximately 1 week history of gradually worsening shortness of breath.  He was found to have acute hypoxic respiratory failure and was requiring high flow oxygen to maintain O2 saturations.  Chest x-ray was typical of Covid pneumonitis.    Patient stated had a URI symptoms, weakness that started before his shortness of breath.  He presented to the ED on 12/23 and was diagnosed with COVID-19-but was discharged home.    Subjective: 1/6 afebrile overnight, negative abdominal pain, negative CP, positive S OB but significantly improved    Assessment & Plan:   Principal Problem:   Acute respiratory failure with hypoxemia (HCC) Active Problems:   Type 2 diabetes, controlled, with peripheral neuropathy (HCC)   Hypertension   Morbid obesity ([place])   Pneumonia due to COVID-19 virus   Diabetes mellitus type 2, uncontrolled, with complications ([place])   Acute pulmonary embolus ([place])   Demand ischemia (HCC)   Constipation   Covid pneumonia/acute respiratory failure with hypoxia  COVID-19 Labs  Recent Labs    11/17/19 0200  DDIMER 3.49*  FERRITIN 437*  CRP <0.5    No results found for: SARSCOV2NAA -Solu-Medrol 53 mg BID -Remdesivir per pharmacy protocol -Actemra received at [facility] -Combivent -Flutter valve -Incentive spirometry -Titrate O2 to maintain SPO2> 88% -Prone patient for 16 hours/day; if patient cannot tolerate prone 2 to 3 hours per shift -1/2 Lasix IV 60 mg -Lasix as needed  Acute PE -CTA chest PE protocol positive for PE see results below -Currently on full dose Lovenox -Bilateral lower extremity Dopplers negative for DVT  Essential HTN -Currently controlled  Diabetes type 2 uncontrolled with  complication -41/32 hemoglobin A1c= 7.3 -Resistant SSI  Mild transaminitis -Most likely secondary to Covid, remdesivir, and Actemra monitor closely.    Minimally elevated troponin level: Demand ischemia -Downtrending-EKG reassuring-likely demand ischemia.  Doubt any further work-up is required.  Hyperkalemia -Resolved continue to monitor  Goals of care -On 1/2 requested that [place]  determine best DOAC to place patient on in relation to his insurance.     DVT prophylaxis: Full dose Lovenox Code Status: Full Family Communication: 11/15/2019 spoke with Guerry Minors (wife) counseled her on plan of care answered all questions Disposition Plan:    Consultants:    Procedures/Significant Events:  12/29 CTA chest PE protocol; positive PE small volume of somewhat eccentric appearing embolus present in the distal right pulmonary artery, right-sided lobar lobar, and some proximal segmental branches. No left-sided embolus noted. -Extensive ground-glass and heterogeneous airspace opacity throughout the lungs bilaterally, most conspicuous in the dependent left lung. -CAD- Hepatic steatosis. 12/29 bilateral lower extremity Doppler negative for DVT 1/1 PCXR; Diffuse bilateral airspace opacities consistent with the given clinical history of COVID-19 positivity. Stable    I have personally reviewed and interpreted all radiology studies and my findings are as above.  VENTILATOR SETTINGS: HFNC 1/6 Flow; 13 L/min SPO2 94%    Cultures   Antimicrobials: Anti-infectives (From admission, onward)   Start     Dose/Rate Stop   11/12/19 1000  remdesivir 100 mg in sodium chloride 0.9 % 100 mL IVPB  Status:  Discontinued     100 mg 200 mL/hr over 30 Minutes 11/11/19 1238   11/12/19 1000  remdesivir 100 mg in sodium chloride 0.9 %  100 mL IVPB     100 mg 200 mL/hr over 30 Minutes 11/15/19 0959   11/11/19 1330  cefTRIAXone (ROCEPHIN) 1 g in sodium chloride 0.9 % 100 mL IVPB     1 g 200  mL/hr over 30 Minutes 11/16/19 1314   11/11/19 1330  azithromycin (ZITHROMAX) 500 mg in sodium chloride 0.9 % 250 mL IVPB     500 mg 250 mL/hr over 60 Minutes 11/14/19 1329   11/11/19 1230  remdesivir 200 mg in sodium chloride 0.9% 250 mL IVPB  Status:  Discontinued     200 mg 580 mL/hr over 30 Minutes 11/11/19 1238       Devices    LINES / TUBES:      Continuous Infusions: . sodium chloride       Objective: Vitals:   11/18/19 1936 11/18/19 2353 11/19/19 0404 11/19/19 0800  BP:  (!) 138/92 134/90 (!) 140/98  Pulse: 94 95 89 87  Resp: (!) 23 (!) 21 (!) 22 (!) 21  Temp: 97.9 F (36.6 C) 98.8 F (37.1 C) 98.8 F (37.1 C) 98.4 F (36.9 C)  TempSrc: Oral Oral Oral Oral  SpO2: 93% 91% 91% 91%  Weight:      Height:        Intake/Output Summary (Last 24 hours) at 11/19/2019 1427 Last data filed at 11/19/2019 1300 Gross per 24 hour  Intake --  Output 900 ml  Net -900 ml   Filed Weights   11/11/19 1500  Weight: 106.1 kg   Physical Exam:  General: A/O x4, positive acute respiratory distress Eyes: negative scleral hemorrhage, negative anisocoria, negative icterus ENT: Negative Runny nose, negative gingival bleeding, Neck:  Negative scars, masses, torticollis, lymphadenopathy, JVD Lungs: Diffuse expiratory wheezing bilaterally without wheezes or crackles Cardiovascular: Regular rate and rhythm without murmur gallop or rub normal S1 and S2 Abdomen: negative abdominal pain, nondistended, positive soft, bowel sounds, no rebound, no ascites, no appreciable mass Extremities: No significant cyanosis, clubbing, or edema bilateral lower extremities Skin: Negative rashes, lesions, ulcers Psychiatric:  Negative depression, negative anxiety, negative fatigue, negative mania  Central nervous system:  Cranial nerves II through XII intact, tongue/uvula midline, all extremities muscle strength 5/5, sensation intact throughout, negative dysarthria, negative expressive aphasia, negative  receptive aphasia.     .     Data Reviewed: Care during the described time interval was provided by me .  I have reviewed this patient's available data, including medical history, events of note, physical examination, and all test results as part of my evaluation.   CBC: Recent Labs  Lab 11/13/19 0120 11/14/19 0130 11/15/19 0307 11/16/19 0145 11/17/19 0200 11/18/19 1611 11/19/19 0238  WBC 13.7* 13.8* 14.6* 16.8* 22.8* 21.6* 21.3*  NEUTROABS 10.6* 10.4* 11.5* 13.5* 19.5*  --   --   HGB 14.1 14.0 14.9 15.7 15.2 15.4 15.3  HCT 43.8 43.8 46.4 49.0 47.6 48.2 47.2  MCV 81.9 80.5 81.4 82.1 81.5 81.1 81.8  PLT 241 225 232 245 255 273 241   Basic Metabolic Panel: Recent Labs  Lab 11/14/19 0130 11/15/19 0307 11/16/19 0145 11/17/19 0200 11/18/19 0150 11/19/19 0238  NA 139 138 139 137  --  137  K 5.0 5.2* 4.7 4.7  --  4.9  CL 105 101 98 99  --  98  CO2 24 24 24 23   --  25  GLUCOSE 157* 164* 169* 152*  --  182*  BUN 24* 24* 28* 29*  --  33*  CREATININE 1.06 1.03  1.20 1.06  --  1.02  CALCIUM 7.9* 8.3* 8.9 8.9  --  8.9  MG 2.8* 2.8* 2.6* 2.6* 2.5* 2.7*  PHOS 3.6 4.0 5.2* 4.1 3.5 3.8   GFR: Estimated Creatinine Clearance: 95 mL/min (by C-G formula based on SCr of 1.02 mg/dL). Liver Function Tests: Recent Labs  Lab 11/13/19 0120 11/14/19 0130 11/15/19 0307 11/16/19 0145 11/17/19 0200  AST 102* 101* 96* 75* 50*  ALT 90* 89* 108* 111* 91*  ALKPHOS 161* 162* 192* 177* 135*  BILITOT 0.9 0.8 0.9 1.0 0.8  PROT 7.4 6.9 7.2 7.6 7.4  ALBUMIN 3.2* 3.0* 3.4* 3.7 3.5   No results for input(s): LIPASE, AMYLASE in the last 168 hours. No results for input(s): AMMONIA in the last 168 hours. Coagulation Profile: Recent Labs  Lab 11/18/19 1058 11/19/19 0238  INR 1.2 1.5*   Cardiac Enzymes: No results for input(s): CKTOTAL, CKMB, CKMBINDEX, TROPONINI in the last 168 hours. BNP (last 3 results) No results for input(s): PROBNP in the last 8760 hours. HbA1C: No results for  input(s): HGBA1C in the last 72 hours. CBG: Recent Labs  Lab 11/18/19 1114 11/18/19 1640 11/18/19 2105 11/19/19 0729 11/19/19 1134  GLUCAP 214* 260* 277* 192* 182*   Lipid Profile: No results for input(s): CHOL, HDL, LDLCALC, TRIG, CHOLHDL, LDLDIRECT in the last 72 hours. Thyroid Function Tests: No results for input(s): TSH, T4TOTAL, FREET4, T3FREE, THYROIDAB in the last 72 hours. Anemia Panel: Recent Labs    11/17/19 0200  FERRITIN 437*   Urine analysis:    Component Value Date/Time   COLORURINE YELLOW (A) 07/08/2018 1113   APPEARANCEUR CLEAR (A) 07/08/2018 1113   LABSPEC 1.013 07/08/2018 1113   PHURINE 5.0 07/08/2018 1113   GLUCOSEU NEGATIVE 07/08/2018 1113   HGBUR NEGATIVE 07/08/2018 1113   BILIRUBINUR NEGATIVE 07/08/2018 1113   KETONESUR NEGATIVE 07/08/2018 1113   PROTEINUR NEGATIVE 07/08/2018 1113   NITRITE NEGATIVE 07/08/2018 1113   LEUKOCYTESUR NEGATIVE 07/08/2018 1113   Sepsis Labs: @LABRCNTIP (procalcitonin:4,lacticidven:4)  ) Recent Results (from the past 240 hour(s))  Blood Culture (routine x 2)     Status: None   Collection Time: 11/09/19 10:51 PM   Specimen: BLOOD LEFT HAND  Result Value Ref Range Status   Specimen Description BLOOD LEFT HAND  Final   Special Requests   Final    BOTTLES DRAWN AEROBIC AND ANAEROBIC Blood Culture adequate volume   Culture   Final    NO GROWTH 5 DAYS Performed at [facility], [address] [place], [place]    Report Status 11/14/2019 FINAL  Final  Blood Culture (routine x 2)     Status: None   Collection Time: 11/09/19 11:03 PM   Specimen: BLOOD  Result Value Ref Range Status   Specimen Description BLOOD RIGHT ANTECUBITAL  Final   Special Requests   Final    BOTTLES DRAWN AEROBIC AND ANAEROBIC Blood Culture adequate volume   Culture   Final    NO GROWTH 5 DAYS Performed at [facility], [address], [place], [place]    Report Status 11/14/2019 FINAL  Final  MRSA  PCR Screening     Status: None   Collection Time: 11/10/19  6:42 AM   Specimen: Nasal Mucosa; Nasopharyngeal  Result Value Ref Range Status   MRSA by PCR NEGATIVE NEGATIVE Final    Comment:        The GeneXpert MRSA Assay (FDA approved for NASAL specimens only), is one component of a comprehensive  MRSA colonization surveillance program. It is not intended to diagnose MRSA infection nor to guide or monitor treatment for MRSA infections. Performed at [facility], [address] [place], [place] [address]   Culture, blood (Routine X 2) w Reflex to ID Panel     Status: None   Collection Time: 11/10/19  7:54 AM   Specimen: BLOOD  Result Value Ref Range Status   Specimen Description BLOOD BLOOD RIGHT FOREARM  Final   Special Requests   Final    BOTTLES DRAWN AEROBIC AND ANAEROBIC Blood Culture adequate volume   Culture   Final    NO GROWTH 5 DAYS Performed at [facility], [address], [place], [place] [address]    Report Status 11/15/2019 FINAL  Final  Culture, blood (Routine X 2) w Reflex to ID Panel     Status: None   Collection Time: 11/10/19  7:55 AM   Specimen: BLOOD  Result Value Ref Range Status   Specimen Description BLOOD BLOOD RIGHT HAND  Final   Special Requests   Final    BOTTLES DRAWN AEROBIC AND ANAEROBIC Blood Culture adequate volume   Culture   Final    NO GROWTH 5 DAYS Performed at [facility], [address] [place], [place] [address]    Report Status 11/15/2019 FINAL  Final         Radiology Studies: No results found.      Scheduled Meds: . vitamin C  500 mg Oral Daily  . docusate sodium  100 mg Oral BID  . enoxaparin (LOVENOX) injection  1 mg/kg Subcutaneous Q12H  . insulin aspart  0-20 Units Subcutaneous TID WC  . methylPREDNISolone (SOLU-MEDROL) injection  0.5 mg/kg Intravenous Q12H  . sodium chloride flush  3 mL Intravenous Q12H  . Warfarin - Pharmacist Dosing Inpatient   Does not apply q1800  . zinc  sulfate  220 mg Oral Daily   Continuous Infusions: . sodium chloride       LOS: 8 days   The patient is critically ill with multiple organ systems failure and requires high complexity decision making for assessment and support, frequent evaluation and titration of therapies, application of advanced monitoring technologies and extensive interpretation of multiple databases. Critical Care Time devoted to patient care services described in this note  Time spent: 40 minutes     Angelo Prindle, Roselind Messier, MD [facility] Pager [phone]  If 7PM-7AM, please contact night-coverage [email or web address] Password [facility] 11/19/2019, 2:27 PM

## 2019-11-20 LAB — CBC WITH DIFFERENTIAL/PLATELET
Abs Immature Granulocytes: 0.49 10*3/uL — ABNORMAL HIGH (ref 0.00–0.07)
Basophils Absolute: 0 10*3/uL (ref 0.0–0.1)
Basophils Relative: 0 %
Eosinophils Absolute: 0 10*3/uL (ref 0.0–0.5)
Eosinophils Relative: 0 %
HCT: 47.4 % (ref 39.0–52.0)
Hemoglobin: 15.1 g/dL (ref 13.0–17.0)
Immature Granulocytes: 2 %
Lymphocytes Relative: 10 %
Lymphs Abs: 2.8 10*3/uL (ref 0.7–4.0)
MCH: 26.1 pg (ref 26.0–34.0)
MCHC: 31.9 g/dL (ref 30.0–36.0)
MCV: 81.9 fL (ref 80.0–100.0)
Monocytes Absolute: 1.7 10*3/uL — ABNORMAL HIGH (ref 0.1–1.0)
Monocytes Relative: 6 %
Neutro Abs: 22.2 10*3/uL — ABNORMAL HIGH (ref 1.7–7.7)
Neutrophils Relative %: 82 %
Platelets: 258 10*3/uL (ref 150–400)
RBC: 5.79 MIL/uL (ref 4.22–5.81)
RDW: 15.3 % (ref 11.5–15.5)
WBC: 27.2 10*3/uL — ABNORMAL HIGH (ref 4.0–10.5)
nRBC: 0 % (ref 0.0–0.2)

## 2019-11-20 LAB — COMPREHENSIVE METABOLIC PANEL
ALT: 84 U/L — ABNORMAL HIGH (ref 0–44)
AST: 40 U/L (ref 15–41)
Albumin: 3.9 g/dL (ref 3.5–5.0)
Alkaline Phosphatase: 101 U/L (ref 38–126)
Anion gap: 12 (ref 5–15)
BUN: 26 mg/dL — ABNORMAL HIGH (ref 6–20)
CO2: 24 mmol/L (ref 22–32)
Calcium: 8.7 mg/dL — ABNORMAL LOW (ref 8.9–10.3)
Chloride: 97 mmol/L — ABNORMAL LOW (ref 98–111)
Creatinine, Ser: 0.99 mg/dL (ref 0.61–1.24)
GFR calc Af Amer: >60 mL/min (ref >60)
GFR calc non Af Amer: >60 mL/min (ref >60)
Glucose, Bld: 146 mg/dL — ABNORMAL HIGH (ref 70–99)
Potassium: 4.3 mmol/L (ref 3.5–5.1)
Sodium: 133 mmol/L — ABNORMAL LOW (ref 135–145)
Total Bilirubin: 1 mg/dL (ref 0.3–1.2)
Total Protein: 7.6 g/dL (ref 6.5–8.1)

## 2019-11-20 LAB — GLUCOSE, CAPILLARY
Glucose-Capillary: 261 mg/dL — ABNORMAL HIGH (ref 70–99)
Glucose-Capillary: 186 mg/dL — ABNORMAL HIGH (ref 70–99)
Glucose-Capillary: 137 mg/dL — ABNORMAL HIGH (ref 70–99)
Glucose-Capillary: 285 mg/dL — ABNORMAL HIGH (ref 70–99)

## 2019-11-20 LAB — PROTIME-INR
INR: 1.8 — ABNORMAL HIGH (ref 0.8–1.2)
Prothrombin Time: 20.5 seconds — ABNORMAL HIGH (ref 11.4–15.2)

## 2019-11-20 LAB — FERRITIN: Ferritin: 445 ng/mL — ABNORMAL HIGH (ref 24–336)

## 2019-11-20 LAB — D-DIMER, QUANTITATIVE: D-Dimer, Quant: 2.48 ug/mL-FEU — ABNORMAL HIGH (ref 0.00–0.50)

## 2019-11-20 LAB — MAGNESIUM: Magnesium: 2.6 mg/dL — ABNORMAL HIGH (ref 1.7–2.4)

## 2019-11-20 LAB — PHOSPHORUS: Phosphorus: 3.2 mg/dL (ref 2.5–4.6)

## 2019-11-20 LAB — C-REACTIVE PROTEIN: CRP: 0.8 mg/dL (ref <1.0)

## 2019-11-20 MED ORDER — WARFARIN SODIUM 7.5 MG PO TABS
7.5000 mg | ORAL_TABLET | Freq: Once | ORAL | Status: AC
  Administered 2019-11-20: 7.5 mg via ORAL
  Filled 2019-11-20: qty 1

## 2019-11-20 MED ORDER — POLYETHYLENE GLYCOL 3350 17 G PO PACK
17.0000 g | PACK | Freq: Every day | ORAL | Status: DC | PRN
  Administered 2019-11-20 – 2019-11-21 (×2): 17 g via ORAL
  Filled 2019-11-20 (×2): qty 1

## 2019-11-20 NOTE — Progress Notes (Signed)
ANTICOAGULATION CONSULT NOTE - Follow Up Consult  Pharmacy Consult for Lovenox / Warfarin Indication: pulmonary embolus  No Known Allergies  Patient Measurements: Height: 5\' 5"  (165.1 cm) Weight: 234 lb (106.1 kg) IBW/kg (Calculated) : 61.5   Vital Signs: Temp: 98.7 F (37.1 C) (01/07 0800) Temp Source: Oral (01/07 0800) BP: 134/83 (01/07 0921) Pulse Rate: 106 (01/07 0922)  Labs: Recent Labs    11/18/19 1058 11/18/19 1611 11/19/19 0238 11/20/19 0050  HGB  --  15.4 15.3 15.1  HCT  --  48.2 47.2 47.4  PLT  --  273 241 258  LABPROT 14.6  --  18.4* 20.5*  INR 1.2  --  1.5* 1.8*  CREATININE  --   --  1.02 0.99    Estimated Creatinine Clearance: 97.9 mL/min (by C-G formula based on SCr of 0.99 mg/dL).   Assessment: 54 year old male with new PE already on Lovenox to begin warfarin Day 4/5 of overlap INR 1.8  Goal of Therapy:  INR 2-3 Monitor platelets by anticoagulation protocol: Yes   Plan:  Warfarin 7.5 mg po x 1 dose tonight Continue Lovenox 105 mg sq Q 12 hours Daily INR  Thank you 44, PharmD, BCPS 11/20/2019,9:22 AM

## 2019-11-20 NOTE — Plan of Care (Signed)
  Problem: Education: Goal: Knowledge of risk factors and measures for prevention of condition will improve Outcome: Progressing   Problem: Coping: Goal: Psychosocial and spiritual needs will be supported Outcome: Progressing   Problem: Respiratory: Goal: Will maintain a patent airway Outcome: Progressing Goal: Complications related to the disease process, condition or treatment will be avoided or minimized Outcome: Progressing   

## 2019-11-20 NOTE — Progress Notes (Signed)
PROGRESS NOTE    Nathaniel Webb  MWU:[id number] DOB: [birth year] DOA: 11/11/2019 PCP: Langston Reusing, NP   Brief Narrative:  54 year old BM PMHx diabetes type 2 uncontrolled with complication/diabetic neuropathy,, HTN, HLD, obesity  Presented to [facility] with approximately 1 week history of gradually worsening shortness of breath.  He was found to have acute hypoxic respiratory failure and was requiring high flow oxygen to maintain O2 saturations.  Chest x-ray was typical of Covid pneumonitis.    Patient stated had a URI symptoms, weakness that started before his shortness of breath.  He presented to the ED on 12/23 and was diagnosed with COVID-19-but was discharged home.    Subjective: 1/7 A/O x4, negative CP, positive S OB, positive abdominal fullness (feels constipated).    Assessment & Plan:   Principal Problem:   Acute respiratory failure with hypoxemia (HCC) Active Problems:   Type 2 diabetes, controlled, with peripheral neuropathy (HCC)   Hypertension   Morbid obesity ([place])   Pneumonia due to COVID-19 virus   Diabetes mellitus type 2, uncontrolled, with complications ([place])   Acute pulmonary embolus ([place])   Demand ischemia (HCC)   Constipation   Covid pneumonia/acute respiratory failure with hypoxia  COVID-19 Labs  Recent Labs    11/20/19 0050  DDIMER 2.48*  FERRITIN 445*  CRP 0.8    No results found for: SARSCOV2NAA 12/23 POC SARS coronavirus positive  -Solu-Medrol 53 mg BID -Remdesivir per pharmacy protocol -Actemra received at [facility] -Combivent -Flutter valve -Incentive spirometry -Titrate O2 to maintain SPO2> 88% -Prone patient for 16 hours/day; if patient cannot tolerate prone 2 to 3 hours per shift -1/2 Lasix IV 60 mg -Lasix as needed  Acute PE -CTA chest PE protocol positive for PE see results below -Currently on full dose Lovenox -Bilateral lower extremity Dopplers negative for DVT  Essential HTN -Currently controlled  Diabetes type  2 uncontrolled with complication -72/53 hemoglobin A1c= 7.3 -Resistant SSI  Mild transaminitis -Most likely secondary to Covid, remdesivir, and Actemra monitor closely.    Minimally elevated troponin level: Demand ischemia -Downtrending-EKG reassuring-likely demand ischemia.  Doubt any further work-up is required.  Hyperkalemia -Resolved continue to monitor  Constipation -Colace -1/7 MiraLAX  Goals of care -On 1/2 requested that [place]  determine best DOAC to place patient on in relation to his insurance.     DVT prophylaxis: Full dose Lovenox Code Status: Full Family Communication: 11/15/2019 spoke with Guerry Minors (wife) counseled her on plan of care answered all questions Disposition Plan:    Consultants:    Procedures/Significant Events:  12/29 CTA chest PE protocol; positive PE small volume of somewhat eccentric appearing embolus present in the distal right pulmonary artery, right-sided lobar lobar, and some proximal segmental branches. No left-sided embolus noted. -Extensive ground-glass and heterogeneous airspace opacity throughout the lungs bilaterally, most conspicuous in the dependent left lung. -CAD- Hepatic steatosis. 12/29 bilateral lower extremity Doppler negative for DVT 1/1 PCXR; Diffuse bilateral airspace opacities consistent with the given clinical history of COVID-19 positivity. Stable    I have personally reviewed and interpreted all radiology studies and my findings are as above.  VENTILATOR SETTINGS: HFNC 1/7 Flow; 9 L/min SPO2 92%    Cultures 12/23 POC SARS coronavirus positive 12/28 HIV screen negative    Antimicrobials: Anti-infectives (From admission, onward)   Start     Dose/Rate Stop   11/12/19 1000  remdesivir 100 mg in sodium chloride 0.9 % 100 mL IVPB  Status:  Discontinued     100  mg 200 mL/hr over 30 Minutes 11/11/19 1238   11/12/19 1000  remdesivir 100 mg in sodium chloride 0.9 % 100 mL IVPB     100 mg 200 mL/hr  over 30 Minutes 11/15/19 0959   11/11/19 1330  cefTRIAXone (ROCEPHIN) 1 g in sodium chloride 0.9 % 100 mL IVPB     1 g 200 mL/hr over 30 Minutes 11/16/19 1314   11/11/19 1330  azithromycin (ZITHROMAX) 500 mg in sodium chloride 0.9 % 250 mL IVPB     500 mg 250 mL/hr over 60 Minutes 11/14/19 1329   11/11/19 1230  remdesivir 200 mg in sodium chloride 0.9% 250 mL IVPB  Status:  Discontinued     200 mg 580 mL/hr over 30 Minutes 11/11/19 1238       Devices    LINES / TUBES:      Continuous Infusions: . sodium chloride       Objective: Vitals:   11/20/19 0800 11/20/19 0921 11/20/19 0922 11/20/19 1221  BP: 134/83 134/83    Pulse: (!) 102 (!) 107 (!) 106 (!) 104  Resp: (!) 31 (!) 29 (!) 28 (!) 29  Temp: 98.7 F (37.1 C)     TempSrc: Oral     SpO2: (!) 89% 91% 92% 91%  Weight:      Height:        Intake/Output Summary (Last 24 hours) at 11/20/2019 1415 Last data filed at 11/19/2019 1925 Gross per 24 hour  Intake --  Output 300 ml  Net -300 ml   Filed Weights   11/11/19 1500  Weight: 106.1 kg   Physical Exam:  General: A/O x4, positive acute respiratory distress Eyes: negative scleral hemorrhage, negative anisocoria, negative icterus ENT: Negative Runny nose, negative gingival bleeding, Neck:  Negative scars, masses, torticollis, lymphadenopathy, JVD Lungs: Tachypneic diffuse expiratory wheezing bilaterally without  crackles Cardiovascular: Tachycardic without murmur gallop or rub normal S1 and S2 Abdomen: OBESE negative abdominal pain, nondistended, positive soft, bowel sounds, no rebound, no ascites, no appreciable mass Extremities: No significant cyanosis, clubbing, or edema bilateral lower extremities Skin: Negative rashes, lesions, ulcers Psychiatric:  Negative depression, negative anxiety, negative fatigue, negative mania  Central nervous system:  Cranial nerves II through XII intact, tongue/uvula midline, all extremities muscle strength 5/5, sensation intact  throughout, negative dysarthria, negative expressive aphasia, negative receptive aphasia.      .     Data Reviewed: Care during the described time interval was provided by me .  I have reviewed this patient's available data, including medical history, events of note, physical examination, and all test results as part of my evaluation.   CBC: Recent Labs  Lab 11/14/19 0130 11/15/19 0307 11/16/19 0145 11/17/19 0200 11/18/19 1611 11/19/19 0238 11/20/19 0050  WBC 13.8* 14.6* 16.8* 22.8* 21.6* 21.3* 27.2*  NEUTROABS 10.4* 11.5* 13.5* 19.5*  --   --  22.2*  HGB 14.0 14.9 15.7 15.2 15.4 15.3 15.1  HCT 43.8 46.4 49.0 47.6 48.2 47.2 47.4  MCV 80.5 81.4 82.1 81.5 81.1 81.8 81.9  PLT 225 232 245 255 273 241 258   Basic Metabolic Panel: Recent Labs  Lab 11/15/19 0307 11/16/19 0145 11/17/19 0200 11/18/19 0150 11/19/19 0238 11/20/19 0050  NA 138 139 137  --  137 133*  K 5.2* 4.7 4.7  --  4.9 4.3  CL 101 98 99  --  98 97*  CO2 24 24 23   --  25 24  GLUCOSE 164* 169* 152*  --  182* 146*  BUN 24* 28* 29*  --  33* 26*  CREATININE 1.03 1.20 1.06  --  1.02 0.99  CALCIUM 8.3* 8.9 8.9  --  8.9 8.7*  MG 2.8* 2.6* 2.6* 2.5* 2.7* 2.6*  PHOS 4.0 5.2* 4.1 3.5 3.8 3.2   GFR: Estimated Creatinine Clearance: 97.9 mL/min (by C-G formula based on SCr of 0.99 mg/dL). Liver Function Tests: Recent Labs  Lab 11/14/19 0130 11/15/19 0307 11/16/19 0145 11/17/19 0200 11/20/19 0050  AST 101* 96* 75* 50* 40  ALT 89* 108* 111* 91* 84*  ALKPHOS 162* 192* 177* 135* 101  BILITOT 0.8 0.9 1.0 0.8 1.0  PROT 6.9 7.2 7.6 7.4 7.6  ALBUMIN 3.0* 3.4* 3.7 3.5 3.9   No results for input(s): LIPASE, AMYLASE in the last 168 hours. No results for input(s): AMMONIA in the last 168 hours. Coagulation Profile: Recent Labs  Lab 11/18/19 1058 11/19/19 0238 11/20/19 0050  INR 1.2 1.5* 1.8*   Cardiac Enzymes: No results for input(s): CKTOTAL, CKMB, CKMBINDEX, TROPONINI in the last 168 hours. BNP (last 3  results) No results for input(s): PROBNP in the last 8760 hours. HbA1C: No results for input(s): HGBA1C in the last 72 hours. CBG: Recent Labs  Lab 11/19/19 0729 11/19/19 1134 11/19/19 1639 11/19/19 2004 11/20/19 0711  GLUCAP 192* 182* 213* 239* 261*   Lipid Profile: No results for input(s): CHOL, HDL, LDLCALC, TRIG, CHOLHDL, LDLDIRECT in the last 72 hours. Thyroid Function Tests: No results for input(s): TSH, T4TOTAL, FREET4, T3FREE, THYROIDAB in the last 72 hours. Anemia Panel: Recent Labs    11/20/19 0050  FERRITIN 445*   Urine analysis:    Component Value Date/Time   COLORURINE YELLOW (A) 07/08/2018 1113   APPEARANCEUR CLEAR (A) 07/08/2018 1113   LABSPEC 1.013 07/08/2018 1113   PHURINE 5.0 07/08/2018 1113   GLUCOSEU NEGATIVE 07/08/2018 1113   HGBUR NEGATIVE 07/08/2018 1113   BILIRUBINUR NEGATIVE 07/08/2018 1113   KETONESUR NEGATIVE 07/08/2018 1113   PROTEINUR NEGATIVE 07/08/2018 1113   NITRITE NEGATIVE 07/08/2018 1113   LEUKOCYTESUR NEGATIVE 07/08/2018 1113   Sepsis Labs: @LABRCNTIP (procalcitonin:4,lacticidven:4)  ) No results found for this or any previous visit (from the past 240 hour(s)).       Radiology Studies: No results found.      Scheduled Meds: . vitamin C  500 mg Oral Daily  . docusate sodium  100 mg Oral BID  . enoxaparin (LOVENOX) injection  1 mg/kg Subcutaneous Q12H  . insulin aspart  0-20 Units Subcutaneous TID WC  . methylPREDNISolone (SOLU-MEDROL) injection  0.5 mg/kg Intravenous Q12H  . sodium chloride flush  3 mL Intravenous Q12H  . warfarin  7.5 mg Oral ONCE-1800  . Warfarin - Pharmacist Dosing Inpatient   Does not apply q1800  . zinc sulfate  220 mg Oral Daily   Continuous Infusions: . sodium chloride       LOS: 9 days   The patient is critically ill with multiple organ systems failure and requires high complexity decision making for assessment and support, frequent evaluation and titration of therapies, application of  advanced monitoring technologies and extensive interpretation of multiple databases. Critical Care Time devoted to patient care services described in this note  Time spent: 40 minutes     Omolara Carol, , MD [facility] Pager [phone]  If 7PM-7AM, please contact night-coverage [email or web address] Password [email or web address] 11/20/2019, 2:15 PM

## 2019-11-21 ENCOUNTER — Inpatient Hospital Stay (HOSPITAL_COMMUNITY): Payer: Medicaid Other

## 2019-11-21 ENCOUNTER — Encounter: Admission: RE | Payer: Self-pay | Source: Home / Self Care

## 2019-11-21 ENCOUNTER — Ambulatory Visit: Admission: RE | Admit: 2019-11-21 | Payer: Medicaid Other | Source: Home / Self Care | Admitting: Gastroenterology

## 2019-11-21 DIAGNOSIS — R079 Chest pain, unspecified: Secondary | ICD-10-CM

## 2019-11-21 LAB — PROTIME-INR
INR: 2.5 — ABNORMAL HIGH (ref 0.8–1.2)
Prothrombin Time: 26.6 seconds — ABNORMAL HIGH (ref 11.4–15.2)

## 2019-11-21 LAB — CBC WITH DIFFERENTIAL/PLATELET
Abs Immature Granulocytes: 0.32 10*3/uL — ABNORMAL HIGH (ref 0.00–0.07)
Basophils Absolute: 0 10*3/uL (ref 0.0–0.1)
Basophils Relative: 0 %
Eosinophils Absolute: 0 10*3/uL (ref 0.0–0.5)
Eosinophils Relative: 0 %
HCT: 46.3 % (ref 39.0–52.0)
Hemoglobin: 14.6 g/dL (ref 13.0–17.0)
Immature Granulocytes: 1 %
Lymphocytes Relative: 8 %
Lymphs Abs: 2.2 10*3/uL (ref 0.7–4.0)
MCH: 25.8 pg — ABNORMAL LOW (ref 26.0–34.0)
MCHC: 31.5 g/dL (ref 30.0–36.0)
MCV: 81.9 fL (ref 80.0–100.0)
Monocytes Absolute: 1.3 10*3/uL — ABNORMAL HIGH (ref 0.1–1.0)
Monocytes Relative: 5 %
Neutro Abs: 22 10*3/uL — ABNORMAL HIGH (ref 1.7–7.7)
Neutrophils Relative %: 86 %
Platelets: 220 10*3/uL (ref 150–400)
RBC: 5.65 MIL/uL (ref 4.22–5.81)
RDW: 15 % (ref 11.5–15.5)
WBC: 25.9 10*3/uL — ABNORMAL HIGH (ref 4.0–10.5)
nRBC: 0 % (ref 0.0–0.2)

## 2019-11-21 LAB — COMPREHENSIVE METABOLIC PANEL
ALT: 82 U/L — ABNORMAL HIGH (ref 0–44)
AST: 36 U/L (ref 15–41)
Albumin: 3.7 g/dL (ref 3.5–5.0)
Alkaline Phosphatase: 93 U/L (ref 38–126)
Anion gap: 18 — ABNORMAL HIGH (ref 5–15)
BUN: 23 mg/dL — ABNORMAL HIGH (ref 6–20)
CO2: 21 mmol/L — ABNORMAL LOW (ref 22–32)
Calcium: 8.6 mg/dL — ABNORMAL LOW (ref 8.9–10.3)
Chloride: 94 mmol/L — ABNORMAL LOW (ref 98–111)
Creatinine, Ser: 0.8 mg/dL (ref 0.61–1.24)
GFR calc Af Amer: >60 mL/min (ref >60)
GFR calc non Af Amer: >60 mL/min (ref >60)
Glucose, Bld: 142 mg/dL — ABNORMAL HIGH (ref 70–99)
Potassium: 4.4 mmol/L (ref 3.5–5.1)
Sodium: 133 mmol/L — ABNORMAL LOW (ref 135–145)
Total Bilirubin: 1 mg/dL (ref 0.3–1.2)
Total Protein: 7.2 g/dL (ref 6.5–8.1)

## 2019-11-21 LAB — GLUCOSE, CAPILLARY
Glucose-Capillary: 207 mg/dL — ABNORMAL HIGH (ref 70–99)
Glucose-Capillary: 247 mg/dL — ABNORMAL HIGH (ref 70–99)
Glucose-Capillary: 251 mg/dL — ABNORMAL HIGH (ref 70–99)
Glucose-Capillary: 332 mg/dL — ABNORMAL HIGH (ref 70–99)

## 2019-11-21 LAB — POCT I-STAT 7, (LYTES, BLD GAS, ICA,H+H)
Bicarbonate: 23.1 mmol/L (ref 20.0–28.0)
Calcium, Ion: 1.12 mmol/L — ABNORMAL LOW (ref 1.15–1.40)
HCT: 50 % (ref 39.0–52.0)
Hemoglobin: 17 g/dL (ref 13.0–17.0)
O2 Saturation: 99 %
Patient temperature: 98.2
Potassium: 4.7 mmol/L (ref 3.5–5.1)
Sodium: 127 mmol/L — ABNORMAL LOW (ref 135–145)
TCO2: 24 mmol/L (ref 22–32)
pCO2 arterial: 32 mmHg (ref 32.0–48.0)
pH, Arterial: 7.467 — ABNORMAL HIGH (ref 7.350–7.450)
pO2, Arterial: 128 mmHg — ABNORMAL HIGH (ref 83.0–108.0)

## 2019-11-21 LAB — TROPONIN I (HIGH SENSITIVITY)
Troponin I (High Sensitivity): 9 ng/L (ref <18)
Troponin I (High Sensitivity): 11 ng/L (ref <18)

## 2019-11-21 LAB — MAGNESIUM: Magnesium: 2.5 mg/dL — ABNORMAL HIGH (ref 1.7–2.4)

## 2019-11-21 LAB — FERRITIN: Ferritin: 535 ng/mL — ABNORMAL HIGH (ref 24–336)

## 2019-11-21 LAB — PHOSPHORUS: Phosphorus: 3.4 mg/dL (ref 2.5–4.6)

## 2019-11-21 LAB — C-REACTIVE PROTEIN: CRP: 0.7 mg/dL (ref <1.0)

## 2019-11-21 LAB — D-DIMER, QUANTITATIVE: D-Dimer, Quant: 1.69 ug/mL-FEU — ABNORMAL HIGH (ref 0.00–0.50)

## 2019-11-21 SURGERY — COLONOSCOPY WITH PROPOFOL
Anesthesia: General

## 2019-11-21 MED ORDER — FLEET ENEMA 7-19 GM/118ML RE ENEM
1.0000 | ENEMA | Freq: Once | RECTAL | Status: AC
  Administered 2019-11-21: 1 via RECTAL
  Filled 2019-11-21: qty 1

## 2019-11-21 MED ORDER — NITROGLYCERIN 0.4 MG SL SUBL
0.4000 mg | SUBLINGUAL_TABLET | SUBLINGUAL | Status: DC | PRN
  Administered 2019-11-26: 0.4 mg via SUBLINGUAL
  Filled 2019-11-21: qty 1

## 2019-11-21 MED ORDER — METOPROLOL TARTRATE 5 MG/5ML IV SOLN
5.0000 mg | Freq: Once | INTRAVENOUS | Status: AC
  Administered 2019-11-21: 5 mg via INTRAVENOUS

## 2019-11-21 MED ORDER — NITROGLYCERIN 0.4 MG SL SUBL
0.4000 mg | SUBLINGUAL_TABLET | SUBLINGUAL | Status: AC
  Administered 2019-11-21: 0.4 mg via SUBLINGUAL

## 2019-11-21 MED ORDER — FUROSEMIDE 10 MG/ML IJ SOLN
60.0000 mg | Freq: Once | INTRAMUSCULAR | Status: AC
  Administered 2019-11-21: 60 mg via INTRAVENOUS
  Filled 2019-11-21: qty 6

## 2019-11-21 MED ORDER — MAGNESIUM CITRATE PO SOLN
0.5000 | Freq: Once | ORAL | Status: AC
  Administered 2019-11-21: 0.5 via ORAL
  Filled 2019-11-21: qty 296

## 2019-11-21 MED ORDER — METOPROLOL TARTRATE 5 MG/5ML IV SOLN
5.0000 mg | Freq: Once | INTRAVENOUS | Status: DC
  Filled 2019-11-21: qty 5

## 2019-11-21 MED ORDER — METOPROLOL TARTRATE 5 MG/5ML IV SOLN
5.0000 mg | Freq: Three times a day (TID) | INTRAVENOUS | Status: DC
  Administered 2019-11-21 – 2019-11-22 (×3): 5 mg via INTRAVENOUS
  Filled 2019-11-21 (×3): qty 5

## 2019-11-21 NOTE — TOC Initial Note (Signed)
Transition of Care [facility]) - Initial/Assessment Note    Patient Details  Name: Nathaniel Webb MRN: [id number] Date of Birth: 1965/12/19  Transition of [facility]) CM/SW Contact:    Elliot Gault, LCSW Phone Number: 11/21/2019, 9:45 AM  Clinical Narrative:                  Pt from home with his wife. He does not currently have medical insurance. Spoke with pt and then his wife by phone at request of MD to start looking into anti-coagulation medication affordability at dc so he knows what to start pt on in the hospital.  Pt and his wife state that pt obtains medications at [facility] in [place]. They are able to provide Eliquis for pt at dc. Printed 30 day coupon to the unit and updated Consulting civil engineer.   Pt is not yet stable for dc. Updated MD that Eliquis can be escribed using "[facility]" in the escribe system at dc.  TOC will be available if any other DC needs arise.  Expected Discharge Plan: Home/Self Care Barriers to Discharge: Continued Medical Work up   Patient Goals and CMS Choice        Expected Discharge Plan and Services Expected Discharge Plan: Home/Self Care In-house Referral: Clinical Social Work     Living arrangements for the past 2 months: Single Family Home                                      Prior Living Arrangements/Services Living arrangements for the past 2 months: Single Family Home Lives with:: Spouse Patient language and need for interpreter reviewed:: Yes Do you feel safe going back to the place where you live?: Yes      Need for Family Participation in Patient Care: Yes (Comment) Care giver support system in place?: Yes (comment)   Criminal Activity/Legal Involvement Pertinent to Current Situation/Hospitalization: No - Comment as needed  Activities of Daily Living Home Assistive Devices/Equipment: Eyeglasses(reading glasses) ADL Screening (condition at time of admission) Patient's cognitive ability  adequate to safely complete daily activities?: Yes Is the patient deaf or have difficulty hearing?: No Does the patient have difficulty seeing, even when wearing glasses/contacts?: No Does the patient have difficulty concentrating, remembering, or making decisions?: No Patient able to express need for assistance with ADLs?: Yes Does the patient have difficulty dressing or bathing?: No Independently performs ADLs?: Yes (appropriate for developmental age) Does the patient have difficulty walking or climbing stairs?: No Weakness of Legs: None Weakness of Arms/Hands: None  Permission Sought/Granted                  Emotional Assessment       Orientation: : Oriented to Self, Oriented to Place, Oriented to  Time, Oriented to Situation Alcohol / Substance Use: Not Applicable Psych Involvement: No (comment)  Admission diagnosis:  Acute respiratory failure with hypoxemia (HCC) [J96.01] Patient Active Problem List   Diagnosis Date Noted  . Constipation 11/17/2019  . Diabetes mellitus type 2, uncontrolled, with complications (HCC) 11/12/2019  . Acute pulmonary embolus (HCC) 11/12/2019  . Demand ischemia (HCC) 11/12/2019  . Acute respiratory failure with hypoxemia (HCC) 11/11/2019  . Pneumonia due to COVID-19 virus 11/10/2019  . COVID-19 11/10/2019  . Acute respiratory failure (HCC) 11/09/2019  . Health care maintenance 07/29/2019  . Metatarsalgia of left foot 06/18/2019  . Prediabetes 05/08/2019  . Peripheral  neuropathy 05/08/2019  . Osteoarthritis of both knees 05/08/2019  . Osteoarthritis of right knee 02/15/2017  . Hand cramps 11/09/2016  . Tinea pedis 06/04/2016  . Low HDL (under 40) 05/09/2016  . High triglycerides 05/09/2016  . Medication monitoring encounter 05/03/2016  . Controlled type 2 diabetes mellitus without complication, without long-term current use of insulin ([place]) 01/04/2016  . Needs flu shot 01/04/2016  . Morbid obesity ([place]) 01/04/2016  . Screening for STD  (sexually transmitted disease) 01/04/2016  . Encounter for cholesteral screening for cardiovascular disease 01/04/2016  . AD (atopic dermatitis) 06/09/2015  . Chronic low back pain 06/09/2015  . Hypertension 06/09/2015  . Type 2 diabetes, controlled, with peripheral neuropathy ([place]) 12/23/2014   PCP:  Langston Reusing, NP Pharmacy:   [facility] [place], [address] [place] [address] Phone: [phone] Fax: [phone]     Social Determinants of Health (SDOH) Interventions    Readmission Risk Interventions Readmission Risk Prevention Plan 11/21/2019  Transportation Screening Complete  Medication Review (RN CM) Complete  Some recent data might be hidden

## 2019-11-21 NOTE — Progress Notes (Signed)
Dr. Joseph Art made aware to call wife with updates on xray results and blood work

## 2019-11-21 NOTE — Progress Notes (Addendum)
PROGRESS NOTE    Nathaniel Webb  HAL:[id number] DOB: [birth year] DOA: 11/11/2019 PCP: Nathaniel Gala, NP   Brief Narrative:  54 year old BM PMHx diabetes type 2 uncontrolled with complication/diabetic neuropathy,, HTN, HLD, obesity  Presented to [facility] with approximately 1 week history of gradually worsening shortness of breath.  He was found to have acute hypoxic respiratory failure and was requiring high flow oxygen to maintain O2 saturations.  Chest x-ray was typical of Covid pneumonitis.    Patient stated had a URI symptoms, weakness that started before his shortness of breath.  He presented to the ED on 12/23 and was diagnosed with COVID-19-but was discharged home.    Subjective: 1/8 last 24 hours positive CP substernal, rated 8/10, feels as if somebody sitting on his chest.  Negative radiation, positive increased S OB.  Positive right lateral abdominal pain.     Assessment & Plan:   Principal Problem:   Acute respiratory failure with hypoxemia (HCC) Active Problems:   Type 2 diabetes, controlled, with peripheral neuropathy (HCC)   Hypertension   Morbid obesity (HCC)   Pneumonia due to COVID-19 virus   Diabetes mellitus type 2, uncontrolled, with complications (HCC)   Acute pulmonary embolus (HCC)   Demand ischemia (HCC)   Constipation   Covid pneumonia/acute respiratory failure with hypoxia  COVID-19 Labs  Recent Labs    11/20/19 0050 11/21/19 0105  DDIMER 2.48* 1.69*  FERRITIN 445* 535*  CRP 0.8 0.7    No results found for: SARSCOV2NAA 12/23 POC SARS coronavirus positive  -Solu-Medrol 53 mg BID -Remdesivir per pharmacy protocol -Actemra received at [facility] -Combivent -Flutter valve -Incentive spirometry -Titrate O2 to maintain SPO2> 88% -Prone patient for 16 hours/day; if patient cannot tolerate prone 2 to 3 hours per shift -PRN Lasix -1/8 Lasix IV 60 mg x 1  ARDS? -Patient's PCXR fits criteria for developing ARDS bilateral increasing  opacification however, respiratory status worsened today, However this was during the time Pt was having CP   -We will obtain ABG  Acute PE -CTA chest PE protocol positive for PE see results below -Currently on full dose Lovenox -Bilateral lower extremity Dopplers negative for DVT -18 Eliquis per pharmacy  Essential HTN -Currently controlled -Strict in and out -4.1 L  Chest pain* -1/8 substernal radiates 8/10 -NTG x1-->6/10, repeat NTG (hold on morphine unless absolutely necessary secondary to patient's constipation) -Troponin sensitive= 11 -1/8 EKG; nonspecific ST-T wave changes, LVH -PCXR; worsening opacification see results below.  See ARDS -Metoprolol IV 5 mg -Echocardiogram pending   Diabetes type 2 uncontrolled with complication -12/28 hemoglobin A1c= 7.3 -Resistant SSI  Mild transaminitis -Most likely secondary to Covid, remdesivir, and Actemra monitor closely.    Minimally elevated troponin level: Demand ischemia -Downtrending-EKG reassuring-likely demand ischemia.  Doubt any further work-up is required.  Hyperkalemia -Resolved continue to monitor  Hyponatremia -Mild asymptomatic will currently monitor  Constipation -Colace -1/7 MiraLAX -1/8 magnesium citrate 1/2 bottle x1 -1/8 KUB; negative SBO/ileus.  Constipation see results below  Goals of care -On 1/2 requested that LCSW [facility]; start on Eliquis may obtain free medication medication MG[facility]     DVT prophylaxis: Full dose Lovenox--> Eliquis Code Status: Full Family Communication: 11/21/2019 spoke with Margaretha Glassing (wife) counseled her on plan of care answered all questions Disposition Plan:    Consultants:     Procedures/Significant Events:  12/29 CTA chest PE protocol; positive PE small volume of somewhat eccentric appearing embolus present in the distal right pulmonary artery, right-sided lobar lobar, and some  proximal segmental branches. No left-sided embolus noted. -Extensive ground-glass  and heterogeneous airspace opacity throughout the lungs bilaterally, most conspicuous in the dependent left lung. -CAD- Hepatic steatosis. 12/29 bilateral lower extremity Doppler negative for DVT 1/1 PCXR; Diffuse bilateral airspace opacities consistent with the given clinical history of COVID-19 positivity. Stable  1/8 PCXR;-interval worsening right lung opacities with persistent left mid and lower lung opacities. 1/8 KUB;-stool noted throughout the colon. No bowel distention or free air. -Bibasilar atelectasis/infiltrates again noted. Right pleural effusion cannot be excluded   I have personally reviewed and interpreted all radiology studies and my findings are as above.  VENTILATOR SETTINGS: HFNC+ NRB 1/8 Flow; 9 L/min SPO2 92%    Cultures 12/23 POC SARS coronavirus positive 12/28 HIV screen negative    Antimicrobials: Anti-infectives (From admission, onward)   Start     Dose/Rate Stop   11/12/19 1000  remdesivir 100 mg in sodium chloride 0.9 % 100 mL IVPB  Status:  Discontinued     100 mg 200 mL/hr over 30 Minutes 11/11/19 1238   11/12/19 1000  remdesivir 100 mg in sodium chloride 0.9 % 100 mL IVPB     100 mg 200 mL/hr over 30 Minutes 11/15/19 0959   11/11/19 1330  cefTRIAXone (ROCEPHIN) 1 g in sodium chloride 0.9 % 100 mL IVPB     1 g 200 mL/hr over 30 Minutes 11/16/19 1314   11/11/19 1330  azithromycin (ZITHROMAX) 500 mg in sodium chloride 0.9 % 250 mL IVPB     500 mg 250 mL/hr over 60 Minutes 11/14/19 1329   11/11/19 1230  remdesivir 200 mg in sodium chloride 0.9% 250 mL IVPB  Status:  Discontinued     200 mg 580 mL/hr over 30 Minutes 11/11/19 1238       Devices    LINES / TUBES:      Continuous Infusions: . sodium chloride       Objective: Vitals:   11/20/19 1948 11/21/19 0400 11/21/19 0740 11/21/19 1054  BP: (!) 156/100 (!) 146/88 (!) 135/92   Pulse: (!) 112 (!) 108 (!) 108   Resp: 20 18 (!) 25   Temp: 97.8 F (36.6 C) 98.5 F (36.9 C) (!)  97.5 F (36.4 C)   TempSrc: Axillary Oral Oral   SpO2: 90% 90% 92% 90%  Weight:      Height:        Intake/Output Summary (Last 24 hours) at 11/21/2019 1107 Last data filed at 11/21/2019 0000 Gross per 24 hour  Intake 120 ml  Output 550 ml  Net -430 ml   Filed Weights   11/11/19 1500  Weight: 106.1 kg   Physical Exam:  General: A/O x4, positive acute respiratory distress Eyes: negative scleral hemorrhage, negative anisocoria, negative icterus ENT: Negative Runny nose, negative gingival bleeding, Neck:  Negative scars, masses, torticollis, lymphadenopathy, JVD Lungs: Tachypnea clear to auscultation bilaterally without wheezes or crackles Cardiovascular: Tachycardic without murmur gallop or rub normal S1 and S2 Abdomen: Positive abdominal pain RLQ, nondistended, positive soft, bowel sounds, no rebound, no ascites, no appreciable mass Extremities: No significant cyanosis, clubbing, or edema bilateral lower extremities Skin: Negative rashes, lesions, ulcers Psychiatric:  Negative depression, negative anxiety, negative fatigue, negative mania  Central nervous system:  Cranial nerves II through XII intact, tongue/uvula midline, all extremities muscle strength 5/5, sensation intact throughout,negative dysarthria, negative expressive aphasia, negative receptive aphasia.        .     Data Reviewed: Care during the described time interval was  provided by me .  I have reviewed this patient's available data, including medical history, events of note, physical examination, and all test results as part of my evaluation.   CBC: Recent Labs  Lab 11/15/19 0307 11/16/19 0145 11/17/19 0200 11/18/19 1611 11/19/19 0238 11/20/19 0050 11/21/19 0105  WBC 14.6* 16.8* 22.8* 21.6* 21.3* 27.2* 25.9*  NEUTROABS 11.5* 13.5* 19.5*  --   --  22.2* 22.0*  HGB 14.9 15.7 15.2 15.4 15.3 15.1 14.6  HCT 46.4 49.0 47.6 48.2 47.2 47.4 46.3  MCV 81.4 82.1 81.5 81.1 81.8 81.9 81.9  PLT 232 245 255 273  241 258 220   Basic Metabolic Panel: Recent Labs  Lab 11/16/19 0145 11/17/19 0200 11/18/19 0150 11/19/19 0238 11/20/19 0050 11/21/19 0105  NA 139 137  --  137 133* 133*  K 4.7 4.7  --  4.9 4.3 4.4  CL 98 99  --  98 97* 94*  CO2 24 23  --  25 24 21*  GLUCOSE 169* 152*  --  182* 146* 142*  BUN 28* 29*  --  33* 26* 23*  CREATININE 1.20 1.06  --  1.02 0.99 0.80  CALCIUM 8.9 8.9  --  8.9 8.7* 8.6*  MG 2.6* 2.6* 2.5* 2.7* 2.6* 2.5*  PHOS 5.2* 4.1 3.5 3.8 3.2 3.4   GFR: Estimated Creatinine Clearance: 121.2 mL/min (by C-G formula based on SCr of 0.8 mg/dL). Liver Function Tests: Recent Labs  Lab 11/15/19 0307 11/16/19 0145 11/17/19 0200 11/20/19 0050 11/21/19 0105  AST 96* 75* 50* 40 36  ALT 108* 111* 91* 84* 82*  ALKPHOS 192* 177* 135* 101 93  BILITOT 0.9 1.0 0.8 1.0 1.0  PROT 7.2 7.6 7.4 7.6 7.2  ALBUMIN 3.4* 3.7 3.5 3.9 3.7   No results for input(s): LIPASE, AMYLASE in the last 168 hours. No results for input(s): AMMONIA in the last 168 hours. Coagulation Profile: Recent Labs  Lab 11/18/19 1058 11/19/19 0238 11/20/19 0050 11/21/19 0105  INR 1.2 1.5* 1.8* 2.5*   Cardiac Enzymes: No results for input(s): CKTOTAL, CKMB, CKMBINDEX, TROPONINI in the last 168 hours. BNP (last 3 results) No results for input(s): PROBNP in the last 8760 hours. HbA1C: No results for input(s): HGBA1C in the last 72 hours. CBG: Recent Labs  Lab 11/20/19 0711 11/20/19 1130 11/20/19 1623 11/20/19 1927 11/21/19 0740  GLUCAP 261* 186* 137* 285* 207*   Lipid Profile: No results for input(s): CHOL, HDL, LDLCALC, TRIG, CHOLHDL, LDLDIRECT in the last 72 hours. Thyroid Function Tests: No results for input(s): TSH, T4TOTAL, FREET4, T3FREE, THYROIDAB in the last 72 hours. Anemia Panel: Recent Labs    11/20/19 0050 11/21/19 0105  FERRITIN 445* 535*   Urine analysis:    Component Value Date/Time   COLORURINE YELLOW (A) 07/08/2018 1113   APPEARANCEUR CLEAR (A) 07/08/2018 1113    LABSPEC 1.013 07/08/2018 1113   PHURINE 5.0 07/08/2018 1113   GLUCOSEU NEGATIVE 07/08/2018 1113   HGBUR NEGATIVE 07/08/2018 1113   BILIRUBINUR NEGATIVE 07/08/2018 1113   KETONESUR NEGATIVE 07/08/2018 1113   PROTEINUR NEGATIVE 07/08/2018 1113   NITRITE NEGATIVE 07/08/2018 1113   LEUKOCYTESUR NEGATIVE 07/08/2018 1113   Sepsis Labs: @LABRCNTIP (procalcitonin:4,lacticidven:4)  ) No results found for this or any previous visit (from the past 240 hour(s)).       Radiology Studies: No results found.      Scheduled Meds: . vitamin C  500 mg Oral Daily  . docusate sodium  100 mg Oral BID  . insulin aspart  0-20 Units Subcutaneous TID WC  . magnesium citrate  0.5 Bottle Oral Once  . sodium chloride flush  3 mL Intravenous Q12H  . Warfarin - Pharmacist Dosing Inpatient   Does not apply q1800  . zinc sulfate  220 mg Oral Daily   Continuous Infusions: . sodium chloride       LOS: 10 days   The patient is critically ill with multiple organ systems failure and requires high complexity decision making for assessment and support, frequent evaluation and titration of therapies, application of advanced monitoring technologies and extensive interpretation of multiple databases. Critical Care Time devoted to patient care services described in this note  Time spent: 40 minutes     Jahson Emanuele, Geraldo Docker, MD [facility] Pager [phone]  If 7PM-7AM, please contact night-coverage [email or web address] Password [facility] 11/21/2019, 11:07 AM

## 2019-11-21 NOTE — Progress Notes (Signed)
Wife called, she is updated on today. She would like a called from MD with results from todays xray and blood work.

## 2019-11-21 NOTE — Progress Notes (Signed)
Dr Joseph Art on unit, discussed pt recent MEWS change, MD sts he feels it is related to constipation and has already placed new orders. MD does not feel like MEWS change is accurate concerning resp status. Orders to give mag-citrate to help with bowels.

## 2019-11-21 NOTE — Plan of Care (Signed)
  Problem: Education: Goal: Knowledge of risk factors and measures for prevention of condition will improve Outcome: Progressing   Problem: Coping: Goal: Psychosocial and spiritual needs will be supported Outcome: Progressing   Problem: Respiratory: Goal: Will maintain a patent airway Outcome: Progressing Goal: Complications related to the disease process, condition or treatment will be avoided or minimized Outcome: Progressing   

## 2019-11-21 NOTE — Progress Notes (Signed)
Pt getting back to bed from chair, reported mild chest pain this am, however this got worse (8/10) when getting back in the bed, and then began to have flank pain. Dr. Joseph Art currently on the floor, EKG ordered and Dr. Joseph Art going to bedside. EKG obtained, nitro ordered, pt then began to desat on 15L HFNC. 100% NRB mask applied, 2nd nitro given and Metoprolol ordered for heart rate. Pt then began to become more comfortable, was 98% on 15L HFNC and NRB mask, heart rate down to 108, RR down to 28. Pt reports chest pain greatly improved and 5/10 now. Emotional support provided to pt. Chest Xray, KUB, troponin, and Echo ordered via Dr. Joseph Art.

## 2019-11-21 NOTE — Progress Notes (Signed)
ANTICOAGULATION CONSULT NOTE  Pharmacy Consult for Lovenox / Warfarin>>Eliquis Indication: pulmonary embolus  No Known Allergies  Patient Measurements: Height: 5\' 5"  (165.1 cm) Weight: 234 lb (106.1 kg) IBW/kg (Calculated) : 61.5   Vital Signs: Temp: 97.5 F (36.4 C) (01/08 0740) Temp Source: Oral (01/08 0740) BP: 135/92 (01/08 0740) Pulse Rate: 108 (01/08 0740)  Labs: Recent Labs    11/19/19 0238 11/20/19 0050 11/21/19 0105  HGB 15.3 15.1 14.6  HCT 47.2 47.4 46.3  PLT 241 258 220  LABPROT 18.4* 20.5* 26.6*  INR 1.5* 1.8* 2.5*  CREATININE 1.02 0.99 0.80    Estimated Creatinine Clearance: 121.2 mL/min (by C-G formula based on SCr of 0.8 mg/dL).   Assessment: 54 year old male with new PE already on Lovenox to begin warfarin. Patient gets medications from medication management in [place] and will be able to ge Eliquis for free there per SW, transition to Eliquis per MD.   Day 5/5 of overlap INR 2.5 with significant increase, CBC stable, no bleeding reported  Plan:  -D/C Lovenox -Daily INR -Initiate Eliquis 10 mg bid x 7 days then 5 mg bid thereafter once INR approaching 2 or less -Monitor for s/s of bleeding  Thank you  [place], PharmD, BCPS 11/21/2019,10:53 AM

## 2019-11-22 ENCOUNTER — Inpatient Hospital Stay (HOSPITAL_COMMUNITY): Payer: Medicaid Other

## 2019-11-22 ENCOUNTER — Inpatient Hospital Stay (HOSPITAL_COMMUNITY): Payer: Self-pay

## 2019-11-22 DIAGNOSIS — R079 Chest pain, unspecified: Secondary | ICD-10-CM

## 2019-11-22 DIAGNOSIS — I5032 Chronic diastolic (congestive) heart failure: Secondary | ICD-10-CM

## 2019-11-22 LAB — COMPREHENSIVE METABOLIC PANEL
ALT: 106 U/L — ABNORMAL HIGH (ref 0–44)
AST: 43 U/L — ABNORMAL HIGH (ref 15–41)
Albumin: 3.5 g/dL (ref 3.5–5.0)
Alkaline Phosphatase: 115 U/L (ref 38–126)
Anion gap: 14 (ref 5–15)
BUN: 33 mg/dL — ABNORMAL HIGH (ref 6–20)
CO2: 27 mmol/L (ref 22–32)
Calcium: 8.4 mg/dL — ABNORMAL LOW (ref 8.9–10.3)
Chloride: 91 mmol/L — ABNORMAL LOW (ref 98–111)
Creatinine, Ser: 1.17 mg/dL (ref 0.61–1.24)
GFR calc Af Amer: >60 mL/min (ref >60)
GFR calc non Af Amer: >60 mL/min (ref >60)
Glucose, Bld: 169 mg/dL — ABNORMAL HIGH (ref 70–99)
Potassium: 4.9 mmol/L (ref 3.5–5.1)
Sodium: 132 mmol/L — ABNORMAL LOW (ref 135–145)
Total Bilirubin: 0.8 mg/dL (ref 0.3–1.2)
Total Protein: 7 g/dL (ref 6.5–8.1)

## 2019-11-22 LAB — CBC WITH DIFFERENTIAL/PLATELET
Abs Immature Granulocytes: 1.1 10*3/uL — ABNORMAL HIGH (ref 0.00–0.07)
Basophils Absolute: 0.1 10*3/uL (ref 0.0–0.1)
Basophils Relative: 0 %
Eosinophils Absolute: 0 10*3/uL (ref 0.0–0.5)
Eosinophils Relative: 0 %
HCT: 46.4 % (ref 39.0–52.0)
Hemoglobin: 15.1 g/dL (ref 13.0–17.0)
Immature Granulocytes: 2 %
Lymphocytes Relative: 4 %
Lymphs Abs: 1.8 10*3/uL (ref 0.7–4.0)
MCH: 26.5 pg (ref 26.0–34.0)
MCHC: 32.5 g/dL (ref 30.0–36.0)
MCV: 81.4 fL (ref 80.0–100.0)
Monocytes Absolute: 2.4 10*3/uL — ABNORMAL HIGH (ref 0.1–1.0)
Monocytes Relative: 5 %
Neutro Abs: 43.9 10*3/uL — ABNORMAL HIGH (ref 1.7–7.7)
Neutrophils Relative %: 89 %
Platelets: 220 10*3/uL (ref 150–400)
RBC: 5.7 MIL/uL (ref 4.22–5.81)
RDW: 15.4 % (ref 11.5–15.5)
WBC: 49.2 10*3/uL — ABNORMAL HIGH (ref 4.0–10.5)
nRBC: 0 % (ref 0.0–0.2)

## 2019-11-22 LAB — PHOSPHORUS: Phosphorus: 4.4 mg/dL (ref 2.5–4.6)

## 2019-11-22 LAB — ECHOCARDIOGRAM COMPLETE
Height: 65 in
Weight: 3744 oz

## 2019-11-22 LAB — PROTIME-INR
INR: 2.3 — ABNORMAL HIGH (ref 0.8–1.2)
Prothrombin Time: 25.5 seconds — ABNORMAL HIGH (ref 11.4–15.2)

## 2019-11-22 LAB — C-REACTIVE PROTEIN: CRP: 11.6 mg/dL — ABNORMAL HIGH (ref <1.0)

## 2019-11-22 LAB — FERRITIN: Ferritin: 838 ng/mL — ABNORMAL HIGH (ref 24–336)

## 2019-11-22 LAB — GLUCOSE, CAPILLARY
Glucose-Capillary: 263 mg/dL — ABNORMAL HIGH (ref 70–99)
Glucose-Capillary: 241 mg/dL — ABNORMAL HIGH (ref 70–99)
Glucose-Capillary: 335 mg/dL — ABNORMAL HIGH (ref 70–99)

## 2019-11-22 LAB — D-DIMER, QUANTITATIVE: D-Dimer, Quant: 1.12 ug/mL-FEU — ABNORMAL HIGH (ref 0.00–0.50)

## 2019-11-22 LAB — MAGNESIUM: Magnesium: 3.5 mg/dL — ABNORMAL HIGH (ref 1.7–2.4)

## 2019-11-22 MED ORDER — VECURONIUM BROMIDE 10 MG IV SOLR
INTRAVENOUS | Status: AC
  Filled 2019-11-22: qty 10

## 2019-11-22 MED ORDER — INSULIN GLARGINE 100 UNIT/ML ~~LOC~~ SOLN
10.0000 [IU] | Freq: Every day | SUBCUTANEOUS | Status: DC
  Administered 2019-11-22 – 2019-11-24 (×3): 10 [IU] via SUBCUTANEOUS
  Filled 2019-11-22 (×3): qty 0.1

## 2019-11-22 MED ORDER — FENTANYL CITRATE (PF) 100 MCG/2ML IJ SOLN
INTRAMUSCULAR | Status: AC
  Filled 2019-11-22: qty 2

## 2019-11-22 MED ORDER — MIDAZOLAM HCL 2 MG/2ML IJ SOLN
INTRAMUSCULAR | Status: AC
  Filled 2019-11-22: qty 4

## 2019-11-22 MED ORDER — TOCILIZUMAB 400 MG/20ML IV SOLN: 8.0000 mg/kg | Freq: Once | INTRAVENOUS | Status: DC

## 2019-11-22 MED ORDER — METOPROLOL TARTRATE 25 MG PO TABS
12.5000 mg | ORAL_TABLET | Freq: Two times a day (BID) | ORAL | Status: DC
  Administered 2019-11-22: 12.5 mg via ORAL
  Filled 2019-11-22 (×2): qty 1

## 2019-11-22 MED ORDER — ROCURONIUM BROMIDE 10 MG/ML (PF) SYRINGE
PREFILLED_SYRINGE | INTRAVENOUS | Status: AC
  Filled 2019-11-22: qty 10

## 2019-11-22 MED ORDER — POLYETHYLENE GLYCOL 3350 17 G PO PACK
17.0000 g | PACK | Freq: Every day | ORAL | Status: DC
  Administered 2019-11-22 – 2019-11-28 (×4): 17 g via ORAL
  Filled 2019-11-22 (×4): qty 1

## 2019-11-22 MED ORDER — FUROSEMIDE 10 MG/ML IJ SOLN
60.0000 mg | Freq: Once | INTRAMUSCULAR | Status: AC
  Administered 2019-11-22: 60 mg via INTRAVENOUS
  Filled 2019-11-22: qty 6

## 2019-11-22 MED ORDER — BISACODYL 5 MG PO TBEC
5.0000 mg | DELAYED_RELEASE_TABLET | Freq: Every day | ORAL | Status: DC
  Administered 2019-11-22 – 2019-11-28 (×4): 5 mg via ORAL
  Filled 2019-11-22 (×4): qty 1

## 2019-11-22 MED ORDER — ETOMIDATE 2 MG/ML IV SOLN
INTRAVENOUS | Status: AC
  Filled 2019-11-22: qty 20

## 2019-11-22 MED ORDER — METHYLPREDNISOLONE SODIUM SUCC 125 MG IJ SOLR
60.0000 mg | Freq: Every day | INTRAMUSCULAR | Status: DC
  Administered 2019-11-22 – 2019-11-26 (×5): 60 mg via INTRAVENOUS
  Filled 2019-11-22 (×5): qty 2

## 2019-11-22 MED ORDER — ASPIRIN 81 MG PO CHEW
81.0000 mg | CHEWABLE_TABLET | Freq: Every day | ORAL | Status: DC
  Administered 2019-11-22 – 2019-11-23 (×2): 81 mg via ORAL
  Filled 2019-11-22 (×2): qty 1

## 2019-11-22 NOTE — Progress Notes (Signed)
  Echocardiogram 2D Echocardiogram has been performed.  Nathaniel Webb 11/22/2019, 12:16 PM

## 2019-11-22 NOTE — Plan of Care (Signed)
  Problem: Education: Goal: Knowledge of risk factors and measures for prevention of condition will improve Outcome: Progressing   Problem: Coping: Goal: Psychosocial and spiritual needs will be supported Outcome: Progressing   Problem: Respiratory: Goal: Will maintain a patent airway Outcome: Progressing Goal: Complications related to the disease process, condition or treatment will be avoided or minimized Outcome: Progressing   

## 2019-11-22 NOTE — Progress Notes (Addendum)
PROGRESS NOTE    Nathaniel Webb  UXN:[id number] DOB: 08/08/1966 DOA: 11/11/2019 PCP: Rolm Gala, NP   Brief Narrative:  54 year old BM PMHx diabetes type 2 uncontrolled with complication/diabetic neuropathy,, HTN, HLD, obesity  Presented to [facility] with approximately 1 week history of gradually worsening shortness of breath.  He was found to have acute hypoxic respiratory failure and was requiring high flow oxygen to maintain O2 saturations.  Chest x-ray was typical of Covid pneumonitis.    Patient stated had a URI symptoms, weakness that started before his shortness of breath.  He presented to the ED on 12/23 and was diagnosed with COVID-19-but was discharged home.    Subjective: 1/9 afebrile overnight negative CP, states had BM.  Positive S OB   Assessment & Plan:   Principal Problem:   Acute respiratory failure with hypoxemia (HCC) Active Problems:   Type 2 diabetes, controlled, with peripheral neuropathy (HCC)   Hypertension   Morbid obesity (HCC)   Pneumonia due to COVID-19 virus   Diabetes mellitus type 2, uncontrolled, with complications (HCC)   Acute pulmonary embolus (HCC)   Demand ischemia (HCC)   Constipation   Covid pneumonia/acute respiratory failure with hypoxia  COVID-19 Labs  Recent Labs    11/20/19 0050 11/21/19 0105 11/22/19 0408  DDIMER 2.48* 1.69* 1.12*  FERRITIN 445* 535* 838*  CRP 0.8 0.7 11.6*    No results found for: SARSCOV2NAA 12/23 POC SARS coronavirus positive  -1/9 restart Solu-Medrol 60 mg daily -Remdesivir per pharmacy protocol -Actemra received at [facility] -Combivent -Flutter valve -Incentive spirometry -Titrate O2 to maintain SPO2> 88% -Prone patient for 16 hours/day; if patient cannot tolerate prone 2 to 3 hours per shift -PRN Lasix -1/9 Lasix IV 60 mg x 1 -1/9 obtain procalcitonin and if negative repeat Actemra  ARDS? -Patient's PCXR fits criteria for developing ARDS bilateral increasing opacification however,  respiratory status worsened today, However this was during the time Pt was having CP   -ABG essentially normal  Acute PE -CTA chest PE protocol positive for PE see results below -Currently on full dose Lovenox -Bilateral lower extremity Dopplers negative for DVT -18 Eliquis per pharmacy  Chronic diastolic CHF/LVH -Strict in and out -4.4 L -Daily weight -DC metoprolol IV -Metoprolol p.o. 12.5 mg BID -ASA 81 mg daily  Essential HTN -Currently controlled  Chest pain -1/8 substernal radiates 8/10 -NTG x1-->6/10, repeat NTG (hold on morphine unless absolutely necessary secondary to patient's constipation) -Results for CLELAND, SIMKINS (MRN [id number]) as of 11/22/2019 09:02  Ref. Range 11/21/2019 14:00 11/21/2019 15:35  Troponin I (High Sensitivity) Latest Ref Range: <18 ng/L 9 11   -1/8 EKG; nonspecific ST-T wave changes, LVH -1/8 PCXR; worsening opacification see results below.  See ARDS -Echocardiogram; consistent with diastolic CHF see results below -1/9 PCXR per radiologist no substantial change however I believe left lung field slightly improved.  Diabetes type 2 uncontrolled with complication -12/28 hemoglobin A1c= 7.3 -1/9 Lantus 10 units daily -Resistant SSI  Mild transaminitis -Most likely secondary to Covid, remdesivir, and Actemra monitor closely.    Minimally elevated troponin level: Demand ischemia -Downtrending-EKG reassuring-likely demand ischemia.  Doubt any further work-up is required.  Hyperkalemia -Resolved continue to monitor  Hyponatremia -Mild asymptomatic will currently monitor  Constipation -Colace -1/9 MiraLAX daily -1/9 Bisacodyl 5mg  daily  -1/8 KUB; negative SBO/ileus.    Goals of care -On 1/2 requested that [facility]CS[facility]; start on Eliquis may obtain free medication medication MGMT     DVT prophylaxis: Full dose  Lovenox--> Eliquis Code Status: Full Family Communication: 11/21/2019 spoke with Margaretha Glassing (wife) counseled her on plan of care  answered all questions Disposition Plan:    Consultants:     Procedures/Significant Events:  Actemra received at [facility] 12/29 CTA chest PE protocol; positive PE small volume of somewhat eccentric appearing embolus present in the distal right pulmonary artery, right-sided lobar lobar, and some proximal segmental branches. No left-sided embolus noted. -Extensive ground-glass and heterogeneous airspace opacity throughout the lungs bilaterally, most conspicuous in the dependent left lung. -CAD- Hepatic steatosis. 12/29 bilateral lower extremity Doppler negative for DVT 1/1 PCXR; Diffuse bilateral airspace opacities consistent with the given clinical history of COVID-19 positivity. Stable  1/8 PCXR;-interval worsening right lung opacities with persistent left mid and lower lung opacities. 1/8 KUB;-stool noted throughout the colon. No bowel distention or free air. -Bibasilar atelectasis/infiltrates again noted. Right pleural effusion cannot be excluded 1/9 PCXR;-no substantial change in exam.  -Diffuse airspace disease in the right lung and left mid and lower lung involvement, compatible with multifocal pneumonia.  (To me left lung looks slightly improved) 1/9 Echocardiogram;Left Ventricle: EF 70 to 75%. - moderately increased left ventricular hypertrophy.  - Grade I diastolic dysfunction (impaired relaxation). Normal left atrial pressure. Right Ventricle: Tricuspid regurgitant velocity is 3.11 m/s, and with an assumed right atrial pressure of 10 mmHg, the estimated right ventricular  systolic pressure is moderately elevated at 48.6 mmHg.         I have personally reviewed and interpreted all radiology studies and my findings are as above.  VENTILATOR SETTINGS: HFNC+ NRB 1/9 Flow; 15 L/min SPO2 95%    Cultures 12/23 POC SARS coronavirus positive 12/28 HIV screen negative    Antimicrobials: Anti-infectives (From admission, onward)   Start     Dose/Rate Stop   11/12/19  1000  remdesivir 100 mg in sodium chloride 0.9 % 100 mL IVPB  Status:  Discontinued     100 mg 200 mL/hr over 30 Minutes 11/11/19 1238   11/12/19 1000  remdesivir 100 mg in sodium chloride 0.9 % 100 mL IVPB     100 mg 200 mL/hr over 30 Minutes 11/15/19 0959   11/11/19 1330  cefTRIAXone (ROCEPHIN) 1 g in sodium chloride 0.9 % 100 mL IVPB     1 g 200 mL/hr over 30 Minutes 11/16/19 1314   11/11/19 1330  azithromycin (ZITHROMAX) 500 mg in sodium chloride 0.9 % 250 mL IVPB     500 mg 250 mL/hr over 60 Minutes 11/14/19 1329   11/11/19 1230  remdesivir 200 mg in sodium chloride 0.9% 250 mL IVPB  Status:  Discontinued     200 mg 580 mL/hr over 30 Minutes 11/11/19 1238       Devices    LINES / TUBES:      Continuous Infusions: . sodium chloride       Objective: Vitals:   11/21/19 1530 11/21/19 1930 11/22/19 0430 11/22/19 0725  BP: 138/76 111/61 113/76 124/85  Pulse: (!) 109 (!) 117 (!) 108 (!) 104  Resp: (!) 27 (!) 22 20 (!) 40  Temp: 98.2 F (36.8 C) (!) 97.4 F (36.3 C) 97.8 F (36.6 C) 97.9 F (36.6 C)  TempSrc: Oral Axillary Axillary Oral  SpO2: 99% 96%  92%  Weight:      Height:        Intake/Output Summary (Last 24 hours) at 11/22/2019 0900 Last data filed at 11/22/2019 0300 Gross per 24 hour  Intake 240 ml  Output 400 ml  Net -160 ml   Filed Weights   11/11/19 1500  Weight: 106.1 kg   Physical Exam:  General: A/O x4, positive acute respiratory distress Eyes: negative scleral hemorrhage, negative anisocoria, negative icterus ENT: Negative Runny nose, negative gingival bleeding, Neck:  Negative scars, masses, torticollis, lymphadenopathy, JVD Lungs: Tachypneic decreased breath sounds left lung field/RUL but able to auscultate.  Remainder of lung fields absent breath sounds. without wheezes or crackles Cardiovascular: Tachycardic without murmur gallop or rub normal S1 and S2 Abdomen: negative abdominal pain, nondistended, positive soft, bowel sounds, no  rebound, no ascites, no appreciable mass Extremities: No significant cyanosis, clubbing, or edema bilateral lower extremities Skin: Negative rashes, lesions, ulcers Psychiatric:  Negative depression, negative anxiety, negative fatigue, negative mania  Central nervous system:  Cranial nerves II through XII intact, tongue/uvula midline, all extremities muscle strength 5/5, sensation intact throughout, negative dysarthria, negative expressive aphasia, negative receptive aphasia.        .     Data Reviewed: Care during the described time interval was provided by me .  I have reviewed this patient's available data, including medical history, events of note, physical examination, and all test results as part of my evaluation.   CBC: Recent Labs  Lab 11/16/19 0145 11/17/19 0200 11/18/19 1611 11/19/19 0238 11/20/19 0050 11/21/19 0105 11/21/19 1846 11/22/19 0408  WBC 16.8* 22.8* 21.6* 21.3* 27.2* 25.9*  --  49.2*  NEUTROABS 13.5* 19.5*  --   --  22.2* 22.0*  --  43.9*  HGB 15.7 15.2 15.4 15.3 15.1 14.6 17.0 15.1  HCT 49.0 47.6 48.2 47.2 47.4 46.3 50.0 46.4  MCV 82.1 81.5 81.1 81.8 81.9 81.9  --  81.4  PLT 245 255 273 241 258 220  --  993   Basic Metabolic Panel: Recent Labs  Lab 11/17/19 0200 11/18/19 0150 11/19/19 0238 11/20/19 0050 11/21/19 0105 11/21/19 1846 11/22/19 0408  NA 137  --  137 133* 133* 127* 132*  K 4.7  --  4.9 4.3 4.4 4.7 4.9  CL 99  --  98 97* 94*  --  91*  CO2 23  --  25 24 21*  --  27  GLUCOSE 152*  --  182* 146* 142*  --  169*  BUN 29*  --  33* 26* 23*  --  33*  CREATININE 1.06  --  1.02 0.99 0.80  --  1.17  CALCIUM 8.9  --  8.9 8.7* 8.6*  --  8.4*  MG 2.6* 2.5* 2.7* 2.6* 2.5*  --  3.5*  PHOS 4.1 3.5 3.8 3.2 3.4  --  4.4   GFR: Estimated Creatinine Clearance: 82.8 mL/min (by C-G formula based on SCr of 1.17 mg/dL). Liver Function Tests: Recent Labs  Lab 11/16/19 0145 11/17/19 0200 11/20/19 0050 11/21/19 0105 11/22/19 0408  AST 75* 50* 40  36 43*  ALT 111* 91* 84* 82* 106*  ALKPHOS 177* 135* 101 93 115  BILITOT 1.0 0.8 1.0 1.0 0.8  PROT 7.6 7.4 7.6 7.2 7.0  ALBUMIN 3.7 3.5 3.9 3.7 3.5   No results for input(s): LIPASE, AMYLASE in the last 168 hours. No results for input(s): AMMONIA in the last 168 hours. Coagulation Profile: Recent Labs  Lab 11/18/19 1058 11/19/19 0238 11/20/19 0050 11/21/19 0105 11/22/19 0408  INR 1.2 1.5* 1.8* 2.5* 2.3*   Cardiac Enzymes: No results for input(s): CKTOTAL, CKMB, CKMBINDEX, TROPONINI in the last 168 hours. BNP (last 3 results) No results for input(s): PROBNP in the last 8760 hours.  HbA1C: No results for input(s): HGBA1C in the last 72 hours. CBG: Recent Labs  Lab 11/20/19 1927 11/21/19 0740 11/21/19 1132 11/21/19 1617 11/21/19 2021  GLUCAP 285* 207* 247* 251* 332*   Lipid Profile: No results for input(s): CHOL, HDL, LDLCALC, TRIG, CHOLHDL, LDLDIRECT in the last 72 hours. Thyroid Function Tests: No results for input(s): TSH, T4TOTAL, FREET4, T3FREE, THYROIDAB in the last 72 hours. Anemia Panel: Recent Labs    11/21/19 0105 11/22/19 0408  FERRITIN 535* 838*   Urine analysis:    Component Value Date/Time   COLORURINE YELLOW (A) 07/08/2018 1113   APPEARANCEUR CLEAR (A) 07/08/2018 1113   LABSPEC 1.013 07/08/2018 1113   PHURINE 5.0 07/08/2018 1113   GLUCOSEU NEGATIVE 07/08/2018 1113   HGBUR NEGATIVE 07/08/2018 1113   BILIRUBINUR NEGATIVE 07/08/2018 1113   KETONESUR NEGATIVE 07/08/2018 1113   PROTEINUR NEGATIVE 07/08/2018 1113   NITRITE NEGATIVE 07/08/2018 1113   LEUKOCYTESUR NEGATIVE 07/08/2018 1113   Sepsis Labs: @LABRCNTIP (procalcitonin:4,lacticidven:4)  ) No results found for this or any previous visit (from the past 240 hour(s)).       Radiology Studies: DG Abd 1 View  Result Date: 11/21/2019 CLINICAL DATA:  Chest pain.  Ileus. EXAM: ABDOMEN - 1 VIEW COMPARISON:  No prior. FINDINGS: Soft tissue structures are unremarkable. Stool noted throughout  the colon. No bowel distention or free air. Bibasilar atelectasis/infiltrates. No acute bony abnormality identified. Bibasilar atelectasis and infiltrates again noted. Right pleural effusion cannot be excluded. IMPRESSION: 1. Stool noted throughout the colon. No bowel distention or free air. 2. Bibasilar atelectasis/infiltrates again noted. Right pleural effusion cannot be excluded. Electronically Signed   By: 01/19/2020  Register   On: 11/21/2019 14:53   DG CHEST PORT 1 VIEW  Result Date: 11/21/2019 CLINICAL DATA:  Chest pain.  History of COVID-19. EXAM: PORTABLE CHEST 1 VIEW COMPARISON:  Chest radiograph 11/14/2019 FINDINGS: Monitoring leads overlie the patient. Stable obscured cardiac and mediastinal contours. Low lung volumes. Slight interval worsening of diffuse opacities throughout the right mid lung with similar-appearing left mid lower lung opacities. Left costophrenic angles excluded from view. No large pneumothorax. IMPRESSION: Interval worsening right lung opacities with persistent left mid and lower lung opacities. Electronically Signed   By: 01/12/2020 M.D.   On: 11/21/2019 14:54        Scheduled Meds: . vitamin C  500 mg Oral Daily  . docusate sodium  100 mg Oral BID  . insulin aspart  0-20 Units Subcutaneous TID WC  . metoprolol tartrate  5 mg Intravenous Once  . metoprolol tartrate  5 mg Intravenous Q8H  . sodium chloride flush  3 mL Intravenous Q12H  . zinc sulfate  220 mg Oral Daily   Continuous Infusions: . sodium chloride       LOS: 11 days   The patient is critically ill with multiple organ systems failure and requires high complexity decision making for assessment and support, frequent evaluation and titration of therapies, application of advanced monitoring technologies and extensive interpretation of multiple databases. Critical Care Time devoted to patient care services described in this note  Time spent: 40 minutes     Venus Gilles, 01/19/2020, MD [facility] Pager [phone]  If 7PM-7AM, please contact night-coverage [email or web address] Password [email or web address] 11/22/2019, 9:00 AM

## 2019-11-23 ENCOUNTER — Inpatient Hospital Stay (HOSPITAL_COMMUNITY): Payer: Medicaid Other

## 2019-11-23 DIAGNOSIS — J90 Pleural effusion, not elsewhere classified: Secondary | ICD-10-CM | POA: Diagnosis present

## 2019-11-23 LAB — CBC WITH DIFFERENTIAL/PLATELET
Abs Immature Granulocytes: 2.13 10*3/uL — ABNORMAL HIGH (ref 0.00–0.07)
Basophils Absolute: 0 10*3/uL (ref 0.0–0.1)
Basophils Relative: 0 %
Eosinophils Absolute: 0 10*3/uL (ref 0.0–0.5)
Eosinophils Relative: 0 %
HCT: 43.8 % (ref 39.0–52.0)
Hemoglobin: 14.3 g/dL (ref 13.0–17.0)
Immature Granulocytes: 5 %
Lymphocytes Relative: 3 %
Lymphs Abs: 1.4 10*3/uL (ref 0.7–4.0)
MCH: 26.1 pg (ref 26.0–34.0)
MCHC: 32.6 g/dL (ref 30.0–36.0)
MCV: 80.1 fL (ref 80.0–100.0)
Monocytes Absolute: 1.9 10*3/uL — ABNORMAL HIGH (ref 0.1–1.0)
Monocytes Relative: 4 %
Neutro Abs: 40.8 10*3/uL — ABNORMAL HIGH (ref 1.7–7.7)
Neutrophils Relative %: 88 %
Platelets: 217 10*3/uL (ref 150–400)
RBC: 5.47 MIL/uL (ref 4.22–5.81)
RDW: 15.1 % (ref 11.5–15.5)
WBC: 46.2 10*3/uL — ABNORMAL HIGH (ref 4.0–10.5)
nRBC: 0 % (ref 0.0–0.2)

## 2019-11-23 LAB — PROTIME-INR
INR: 1.7 — ABNORMAL HIGH (ref 0.8–1.2)
Prothrombin Time: 19.8 seconds — ABNORMAL HIGH (ref 11.4–15.2)

## 2019-11-23 LAB — COMPREHENSIVE METABOLIC PANEL
ALT: 68 U/L — ABNORMAL HIGH (ref 0–44)
AST: 29 U/L (ref 15–41)
Albumin: 3 g/dL — ABNORMAL LOW (ref 3.5–5.0)
Alkaline Phosphatase: 133 U/L — ABNORMAL HIGH (ref 38–126)
Anion gap: 14 (ref 5–15)
BUN: 50 mg/dL — ABNORMAL HIGH (ref 6–20)
CO2: 27 mmol/L (ref 22–32)
Calcium: 8.3 mg/dL — ABNORMAL LOW (ref 8.9–10.3)
Chloride: 87 mmol/L — ABNORMAL LOW (ref 98–111)
Creatinine, Ser: 1.28 mg/dL — ABNORMAL HIGH (ref 0.61–1.24)
GFR calc Af Amer: >60 mL/min (ref >60)
GFR calc non Af Amer: >60 mL/min (ref >60)
Glucose, Bld: 179 mg/dL — ABNORMAL HIGH (ref 70–99)
Potassium: 5.3 mmol/L — ABNORMAL HIGH (ref 3.5–5.1)
Sodium: 128 mmol/L — ABNORMAL LOW (ref 135–145)
Total Bilirubin: 1 mg/dL (ref 0.3–1.2)
Total Protein: 6.9 g/dL (ref 6.5–8.1)

## 2019-11-23 LAB — C-REACTIVE PROTEIN: CRP: 21.3 mg/dL — ABNORMAL HIGH (ref <1.0)

## 2019-11-23 LAB — PHOSPHORUS: Phosphorus: 4.7 mg/dL — ABNORMAL HIGH (ref 2.5–4.6)

## 2019-11-23 LAB — FERRITIN: Ferritin: 1244 ng/mL — ABNORMAL HIGH (ref 24–336)

## 2019-11-23 LAB — D-DIMER, QUANTITATIVE: D-Dimer, Quant: 0.86 ug/mL-FEU — ABNORMAL HIGH (ref 0.00–0.50)

## 2019-11-23 LAB — GLUCOSE, CAPILLARY
Glucose-Capillary: 184 mg/dL — ABNORMAL HIGH (ref 70–99)
Glucose-Capillary: 269 mg/dL — ABNORMAL HIGH (ref 70–99)
Glucose-Capillary: 336 mg/dL — ABNORMAL HIGH (ref 70–99)
Glucose-Capillary: 261 mg/dL — ABNORMAL HIGH (ref 70–99)

## 2019-11-23 LAB — MAGNESIUM: Magnesium: 3.5 mg/dL — ABNORMAL HIGH (ref 1.7–2.4)

## 2019-11-23 LAB — PROCALCITONIN: Procalcitonin: 4.13 ng/mL

## 2019-11-23 MED ORDER — MAGNESIUM CITRATE PO SOLN
0.5000 | Freq: Once | ORAL | Status: AC
  Administered 2019-11-23: 0.5 via ORAL
  Filled 2019-11-23: qty 296

## 2019-11-23 MED ORDER — APIXABAN 5 MG PO TABS
10.0000 mg | ORAL_TABLET | Freq: Two times a day (BID) | ORAL | Status: DC
  Administered 2019-11-23 – 2019-11-24 (×3): 10 mg via ORAL
  Filled 2019-11-23 (×3): qty 2

## 2019-11-23 MED ORDER — METRONIDAZOLE IN NACL 5-0.79 MG/ML-% IV SOLN
500.0000 mg | Freq: Three times a day (TID) | INTRAVENOUS | Status: DC
  Administered 2019-11-24 (×3): 500 mg via INTRAVENOUS
  Filled 2019-11-23 (×5): qty 100

## 2019-11-23 MED ORDER — METOPROLOL TARTRATE 25 MG PO TABS
25.0000 mg | ORAL_TABLET | Freq: Two times a day (BID) | ORAL | Status: DC
  Administered 2019-11-23 – 2019-11-27 (×8): 25 mg via ORAL
  Filled 2019-11-23 (×8): qty 1

## 2019-11-23 MED ORDER — APIXABAN 5 MG PO TABS: 5.0000 mg | ORAL_TABLET | Freq: Two times a day (BID) | ORAL | Status: DC

## 2019-11-23 MED ORDER — SODIUM CHLORIDE 0.9 % IV SOLN
2.0000 g | Freq: Three times a day (TID) | INTRAVENOUS | Status: DC
  Administered 2019-11-23 – 2019-11-24 (×5): 2 g via INTRAVENOUS
  Filled 2019-11-23 (×5): qty 2

## 2019-11-23 MED ORDER — LINEZOLID 600 MG/300ML IV SOLN
600.0000 mg | Freq: Two times a day (BID) | INTRAVENOUS | Status: DC
  Administered 2019-11-24 (×2): 600 mg via INTRAVENOUS
  Filled 2019-11-23 (×4): qty 300

## 2019-11-23 NOTE — Progress Notes (Signed)
ANTICOAGULATION & ANTIBIOTIC CONSULT NOTE  Pharmacy Consult for Lovenox / Warfarin>>Eliquis; Cefepime Indication: pulmonary embolus; HCAP   No Known Allergies  Patient Measurements: Height: 5\' 5"  (165.1 cm) Weight: 210 lb 8.6 oz (95.5 kg) IBW/kg (Calculated) : 61.5   Vital Signs: Temp: 97.9 F (36.6 C) (01/10 0800) Temp Source: Oral (01/10 0800) BP: 93/82 (01/10 0946) Pulse Rate: 109 (01/10 0946)  Labs: Recent Labs    11/21/19 0105 11/21/19 1400 11/21/19 1535 11/21/19 1846 11/22/19 0408 11/23/19 0429  HGB 14.6  --   --  17.0 15.1 14.3  HCT 46.3  --   --  50.0 46.4 43.8  PLT 220  --   --   --  220 217  LABPROT 26.6*  --   --   --  25.5* 19.8*  INR 2.5*  --   --   --  2.3* 1.7*  CREATININE 0.80  --   --   --  1.17 1.28*  TROPONINIHS  --  9 11  --   --   --     Estimated Creatinine Clearance: 71.7 mL/min (A) (by C-G formula based on SCr of 1.28 mg/dL (H)).   Assessment: 54 year old male with new PE already on Lovenox to begin warfarin.   AC: Patient gets medications from medication management in [place] and will be able to ge Eliquis for free there per SW, transition to Eliquis per MD.   INR today has trended down to 1.7. H/H and Plt wnl. Will plan to initiate Eliquis.   ID:  Patient now has worsening leukocytosis with WBC up to 46.2. He remains AF, PCT elevated at 4.13. Pharmacy consulted to start IV Cefepime for HCAP. SCr up slightly to 1.28  Plan:  -Initiate Eliquis 10 mg bid x 7 days then 5 mg bid thereafter once INR approaching 2 or less -Monitor for s/s of bleeding -Will educate patient   -Start Cefepime 2 gm IV Q 8 hours  -Monitor CBC, renal fx, cultures and clinical progress   Thank you  [place], PharmD., BCPS Clinical Pharmacist Clinical phone for 11/23/19 until 5pm: [phone]

## 2019-11-23 NOTE — Progress Notes (Addendum)
PROGRESS NOTE    Jenelle MagesJohnny R Wilcoxson  WGN:[id number]:[id number] DOB: 08/05/1966 DOA: 11/11/2019 PCP: Rolm GalaIloabachie, Chioma E, NP   Brief Narrative:  54 year old BM PMHx diabetes type 2 uncontrolled with complication/diabetic neuropathy,, HTN, HLD, obesity  [facility] with approximately 1 week history of gradually worsening shortness of breath.  He was found to have acute hypoxic respiratory failure and was requiring high flow oxygen to maintain O2 saturations.  Chest x-ray was typical of Covid pneumonitis.    Patient stated had a URI symptoms, weakness that started before his shortness of breath.  He presented to the ED on 12/23 and was diagnosed with COVID-19-but was discharged home.    Subjective: 1/10 afebrile A/O x4, positive S OB patient states feels improved overnight.  Still has pain in abdomen right side    Assessment & Plan:   Principal Problem:   Acute respiratory failure with hypoxemia (HCC) Active Problems:   Type 2 diabetes, controlled, with peripheral neuropathy (HCC)   Hypertension   Morbid obesity (HCC)   Pneumonia due to COVID-19 virus   Diabetes mellitus type 2, uncontrolled, with complications (HCC)   Acute pulmonary embolus (HCC)   Demand ischemia (HCC)   Constipation   Loculated pleural effusion   Covid pneumonia/acute respiratory failure with hypoxia  COVID-19 Labs  Recent Labs    11/21/19 0105 11/22/19 0408 11/23/19 0429  DDIMER 1.69* 1.12* 0.86*  FERRITIN 535* 838* 1,244*  CRP 0.7 11.6* 21.3*    No results found for: SARSCOV2NAA 12/23 POC SARS coronavirus positive  -1/9 restart Solu-Medrol 60 mg daily -Remdesivir per pharmacy protocol -[facility] -Combivent -Flutter valve -Incentive spirometry -Titrate O2 to maintain SPO2> 88% -Prone patient for 16 hours/day; if patient cannot tolerate prone 2 to 3 hours per shift -PRN Lasix -1/9 Lasix IV 60 mg x 1 -1/10 procalcitonin elevated therefore not eligible for Actemra    ARDS/HCAP/Aspiration? -Patient's PCXR fits criteria for developing ARDS bilateral increasing opacification however, respiratory status worsened today, However this was during the time Pt was having CP   -ABG essentially normal -Trend procalcitonin Results for Jenelle MagesMORROW, Oleg R (MRN [id number]) as of 11/23/2019 12:43  Ref. Range 11/14/2019 01:30 11/14/2019 15:15 11/18/2019 16:11 11/23/2019 04:29  Procalcitonin Latest Units: ng/mL 0.33 0.33 <0.10 4.13  -1/10 cefepime x7 days.+ Metronidazole + linezolid.  Loculated pleural effusion -1/10 panculture -1/10 CT abdomen/pelvis -1/10 n.p.o. until swallow evaluation  Loculated RIGHT pleural effusion -New not present on CT angio chest PE protocol 12/29 -Consult PCCM discussed case chest tube vs VATS  Acute PE -CTA chest PE protocol positive for PE see results below -Currently on full dose Lovenox -Bilateral lower extremity Dopplers negative for DVT -18 Eliquis per pharmacy  Chronic diastolic CHF/LVH -Strict in and out -4.4 L -Daily weight Filed Weights   11/11/19 1500 11/23/19 0421  Weight: 106.1 kg 95.5 kg  -DC metoprolol IV -1/10 increase Metoprolol 25 mg  BID  Essential HTN -Currently controlled  Chest pain -1/8 substernal radiates 8/10 -NTG x1-->6/10, repeat NTG (hold on morphine unless absolutely necessary secondary to patient's constipation) -Results for Jenelle MagesMORROW, Audrick R (MRN [id number]) as of 11/22/2019 09:02  Ref. Range 11/21/2019 14:00 11/21/2019 15:35  Troponin I (High Sensitivity) Latest Ref Range: <18 ng/L 9 11   -1/8 EKG; nonspecific ST-T wave changes, LVH -1/8 PCXR; worsening opacification see results below.  See ARDS -Echocardiogram; consistent with diastolic CHF see results below -1/9 PCXR per radiologist no substantial change however I believe left lung field slightly improved.  Diabetes type  2 uncontrolled with complication -12/28 hemoglobin A1c= 7.3 -1/9 Lantus 10 units daily -Resistant SSI  Mild transaminitis -Most  likely secondary to Covid, remdesivir, and Actemra monitor closely.    Minimally elevated troponin level: Demand ischemia -Downtrending-EKG reassuring-likely demand ischemia.  Doubt any further work-up is required.  Hyperkalemia -Resolved continue to monitor  Hyponatremia -Mild asymptomatic will currently monitor  Constipation -Colace -1/9 MiraLAX daily -1/9 Bisacodyl 5mg  daily  -1/8 KUB; negative SBO/ileus.   -Magnesium citrate 1/2 bottle  Goals of care -On 1/2 requested that LCSW [facility]; start on Eliquis may obtain free medication medication MGMT     DVT prophylaxis: Full dose Lovenox--> Eliquis Code Status: Full Family Communication: 11/21/2019 spoke with Margaretha Glassing (wife) counseled her on plan of care answered all questions Disposition Plan:    Consultants:     Procedures/Significant Events:  Actemra received at [facility] 12/29 CTA chest PE protocol; positive PE small volume of somewhat eccentric appearing embolus present in the distal right pulmonary artery, right-sided lobar lobar, and some proximal segmental branches. No left-sided embolus noted. -Extensive ground-glass and heterogeneous airspace opacity throughout the lungs bilaterally, most conspicuous in the dependent left lung. -CAD- Hepatic steatosis. 12/29 bilateral lower extremity Doppler negative for DVT 1/1 PCXR; Diffuse bilateral airspace opacities consistent with the given clinical history of COVID-19 positivity. Stable  1/8 PCXR;-interval worsening right lung opacities with persistent left mid and lower lung opacities. 1/8 KUB;-stool noted throughout the colon. No bowel distention or free air. -Bibasilar atelectasis/infiltrates again noted. Right pleural effusion cannot be excluded 1/9 PCXR;-no substantial change in exam.  -Diffuse airspace disease in the right lung and left mid and lower lung involvement, compatible with multifocal pneumonia.  (To me left lung looks slightly improved) 1/9  Echocardiogram;Left Ventricle: EF 70 to 75%. - moderately increased left ventricular hypertrophy.  - Grade I diastolic dysfunction (impaired relaxation). Normal left atrial pressure. Right Ventricle: Tricuspid regurgitant velocity is 3.11 m/s, and with an assumed right atrial pressure of 10 mmHg, the estimated right ventricular  systolic pressure is moderately elevated at 48.6 mmHg. 1/10 CT abdomen/pelvis W0 contrast;-moderate stool burden throughout the colon. -Aiirspace disease in both lower lobes, right greater than left compatible with pneumonia.  -Loculated right pleural effusion. -Rim calcified structure in the right lower quadrant this may be related to fat necrosis or old omental infarct.       I have personally reviewed and interpreted all radiology studies and my findings are as above.  VENTILATOR SETTINGS: HFNC+ NRB 1/10 Flow; 15 L/min SPO2 95%    Cultures 12/23 POC SARS coronavirus positive 12/28 HIV screen negative 1/10 urine pending 1/10 blood pending 1/10 sputum pending   Antimicrobials: Anti-infectives (From admission, onward)   Start     Dose/Rate Stop   11/12/19 1000  remdesivir 100 mg in sodium chloride 0.9 % 100 mL IVPB  Status:  Discontinued     100 mg 200 mL/hr over 30 Minutes 11/11/19 1238   11/12/19 1000  remdesivir 100 mg in sodium chloride 0.9 % 100 mL IVPB     100 mg 200 mL/hr over 30 Minutes 11/15/19 0959   11/11/19 1330  cefTRIAXone (ROCEPHIN) 1 g in sodium chloride 0.9 % 100 mL IVPB     1 g 200 mL/hr over 30 Minutes 11/16/19 1314   11/11/19 1330  azithromycin (ZITHROMAX) 500 mg in sodium chloride 0.9 % 250 mL IVPB     500 mg 250 mL/hr over 60 Minutes 11/14/19 1329   11/11/19 1230  remdesivir 200 mg  in sodium chloride 0.9% 250 mL IVPB  Status:  Discontinued     200 mg 580 mL/hr over 30 Minutes 11/11/19 1238       Devices    LINES / TUBES:      Continuous Infusions: . sodium chloride    . ceFEPime (MAXIPIME) IV 200 mL/hr at  11/23/19 1600  . linezolid (ZYVOX) IV    . metronidazole       Objective: Vitals:   11/23/19 0800 11/23/19 0946 11/23/19 1326 11/23/19 1531  BP: (!) 111/96 93/82 (!) 141/71 113/69  Pulse: 95 (!) 109 (!) 108 97  Resp: 17  (!) 24 19  Temp: 97.9 F (36.6 C)   97.9 F (36.6 C)  TempSrc: Oral   Oral  SpO2: 97%  90% 93%  Weight:      Height:        Intake/Output Summary (Last 24 hours) at 11/23/2019 1642 Last data filed at 11/23/2019 1600 Gross per 24 hour  Intake 450 ml  Output 1100 ml  Net -650 ml   Filed Weights   11/11/19 1500 11/23/19 0421  Weight: 106.1 kg 95.5 kg   Physical Exam:  General: A/O x4, positive acute respiratory distress Eyes: negative scleral hemorrhage, negative anisocoria, negative icterus ENT: Negative Runny nose, negative gingival bleeding, Neck:  Negative scars, masses, torticollis, lymphadenopathy, JVD Lungs: Tachypneic decreased breath sounds bilaterally RT>>Lt without wheezes or crackles Cardiovascular: Tachycardic without murmur gallop or rub normal S1 and S2 Abdomen: negative abdominal pain, positive distention, positive soft, bowel sounds, no rebound, no ascites, no appreciable mass Extremities: No significant cyanosis, clubbing, or edema bilateral lower extremities Skin: Negative rashes, lesions, ulcers Psychiatric:  Negative depression, negative anxiety, negative fatigue, negative mania  Central nervous system:  Cranial nerves II through XII intact, tongue/uvula midline, all extremities muscle strength 5/5, sensation intact throughout,  negative dysarthria, negative expressive aphasia, negative receptive aphasia.        .     Data Reviewed: Care during the described time interval was provided by me .  I have reviewed this patient's available data, including medical history, events of note, physical examination, and all test results as part of my evaluation.   CBC: Recent Labs  Lab 11/17/19 0200 11/19/19 0238 11/20/19 0050  11/21/19 0105 11/21/19 1846 11/22/19 0408 11/23/19 0429  WBC 22.8* 21.3* 27.2* 25.9*  --  49.2* 46.2*  NEUTROABS 19.5*  --  22.2* 22.0*  --  43.9* 40.8*  HGB 15.2 15.3 15.1 14.6 17.0 15.1 14.3  HCT 47.6 47.2 47.4 46.3 50.0 46.4 43.8  MCV 81.5 81.8 81.9 81.9  --  81.4 80.1  PLT 255 241 258 220  --  220 217   Basic Metabolic Panel: Recent Labs  Lab 11/19/19 0238 11/20/19 0050 11/21/19 0105 11/21/19 1846 11/22/19 0408 11/23/19 0429  NA 137 133* 133* 127* 132* 128*  K 4.9 4.3 4.4 4.7 4.9 5.3*  CL 98 97* 94*  --  91* 87*  CO2 25 24 21*  --  27 27  GLUCOSE 182* 146* 142*  --  169* 179*  BUN 33* 26* 23*  --  33* 50*  CREATININE 1.02 0.99 0.80  --  1.17 1.28*  CALCIUM 8.9 8.7* 8.6*  --  8.4* 8.3*  MG 2.7* 2.6* 2.5*  --  3.5* 3.5*  PHOS 3.8 3.2 3.4  --  4.4 4.7*   GFR: Estimated Creatinine Clearance: 71.7 mL/min (A) (by C-G formula based on SCr of 1.28 mg/dL (H)). Liver Function Tests:  Recent Labs  Lab 11/17/19 0200 11/20/19 0050 11/21/19 0105 11/22/19 0408 11/23/19 0429  AST 50* 40 36 43* 29  ALT 91* 84* 82* 106* 68*  ALKPHOS 135* 101 93 115 133*  BILITOT 0.8 1.0 1.0 0.8 1.0  PROT 7.4 7.6 7.2 7.0 6.9  ALBUMIN 3.5 3.9 3.7 3.5 3.0*   No results for input(s): LIPASE, AMYLASE in the last 168 hours. No results for input(s): AMMONIA in the last 168 hours. Coagulation Profile: Recent Labs  Lab 11/19/19 0238 11/20/19 0050 11/21/19 0105 11/22/19 0408 11/23/19 0429  INR 1.5* 1.8* 2.5* 2.3* 1.7*   Cardiac Enzymes: No results for input(s): CKTOTAL, CKMB, CKMBINDEX, TROPONINI in the last 168 hours. BNP (last 3 results) No results for input(s): PROBNP in the last 8760 hours. HbA1C: No results for input(s): HGBA1C in the last 72 hours. CBG: Recent Labs  Lab 11/22/19 1542 11/22/19 1938 11/23/19 0729 11/23/19 1119 11/23/19 1533  GLUCAP 241* 335* 184* 269* 336*   Lipid Profile: No results for input(s): CHOL, HDL, LDLCALC, TRIG, CHOLHDL, LDLDIRECT in the last 72  hours. Thyroid Function Tests: No results for input(s): TSH, T4TOTAL, FREET4, T3FREE, THYROIDAB in the last 72 hours. Anemia Panel: Recent Labs    11/22/19 0408 11/23/19 0429  FERRITIN 838* 1,244*   Urine analysis:    Component Value Date/Time   COLORURINE YELLOW (A) 07/08/2018 1113   APPEARANCEUR CLEAR (A) 07/08/2018 1113   LABSPEC 1.013 07/08/2018 1113   PHURINE 5.0 07/08/2018 1113   GLUCOSEU NEGATIVE 07/08/2018 1113   HGBUR NEGATIVE 07/08/2018 1113   BILIRUBINUR NEGATIVE 07/08/2018 1113   KETONESUR NEGATIVE 07/08/2018 1113   PROTEINUR NEGATIVE 07/08/2018 1113   NITRITE NEGATIVE 07/08/2018 1113   LEUKOCYTESUR NEGATIVE 07/08/2018 1113   Sepsis Labs: @LABRCNTIP (procalcitonin:4,lacticidven:4)  ) No results found for this or any previous visit (from the past 240 hour(s)).       Radiology Studies: CT ABDOMEN PELVIS WO CONTRAST  Result Date: 11/23/2019 CLINICAL DATA:  Intra-abdominal infection/peritonitis EXAM: CT ABDOMEN AND PELVIS WITHOUT CONTRAST TECHNIQUE: Multidetector CT imaging of the abdomen and pelvis was performed following the standard protocol without IV contrast. COMPARISON:  None. FINDINGS: Lower chest: Extensive airspace disease in the lower lungs compatible with pneumonia. Small to moderate loculated right pleural effusion. Heart is normal size. Hepatobiliary: No focal hepatic abnormality. Gallbladder unremarkable. Pancreas: No focal abnormality or ductal dilatation. Spleen: No focal abnormality.  Normal size. Adrenals/Urinary Tract: No adrenal abnormality. No focal renal abnormality. No stones or hydronephrosis. Urinary bladder is unremarkable. Stomach/Bowel: Moderate stool burden in the colon. No evidence of bowel obstruction. Stomach is mildly distended with fluid. Vascular/Lymphatic: Aortic atherosclerosis. No enlarged abdominal or pelvic lymph nodes. Reproductive: No visible focal abnormality. Other: No free fluid or free air. Rim calcified structure noted in  the right lower quadrant measuring 4.4 x 3.8 cm. Musculoskeletal: No acute bony abnormality. IMPRESSION: Moderate stool burden throughout the colon. Airspace disease in both lower lobes, right greater than left compatible with pneumonia. Loculated right pleural effusion. Aortic atherosclerosis. Rim calcified structure in the right lower quadrant this may be related to fat necrosis or old omental infarct. Electronically Signed   By: 01/21/2020 M.D.   On: 11/23/2019 16:00   DG CHEST PORT 1 VIEW  Result Date: 11/22/2019 CLINICAL DATA:  ARDS. EXAM: PORTABLE CHEST 1 VIEW COMPARISON:  11/21/2019 FINDINGS: 0911 hours. Low volumes. Cardiopericardial silhouette is at upper limits of normal for size. Diffuse airspace disease in the right lung is similar with patchy airspace opacity in  the left mid and lower lung. No substantial pleural effusion or pneumothorax. The visualized bony structures of the thorax are intact. Telemetry leads overlie the chest. IMPRESSION: No substantial change in exam. Diffuse airspace disease in the right lung and left mid and lower lung involvement, compatible with multifocal pneumonia. Electronically Signed   By: Misty Stanley M.D.   On: 11/22/2019 09:49   ECHOCARDIOGRAM COMPLETE  Result Date: 11/22/2019   ECHOCARDIOGRAM REPORT   Patient Name:   LEELYN JASINSKI Date of Exam: 11/22/2019 Medical Rec #:  [id number]       Height:       65.0 in Accession #:    [id number]      Weight:       234.0 lb Date of Birth:  11-20-1965       BSA:          2.11 m Patient Age:    54 years        BP:           124/85 mmHg Patient Gender: M               HR:           104 bpm. Exam Location:  Inpatient Procedure: 2D Echo Indications:    Chest Pain 786.50 / R07.9  History:        Patient has no prior history of Echocardiogram examinations.                 Signs/Symptoms:Chest Pain; Risk Factors:Diabetes, Hypertension                 and Dyslipidemia. COVID-19.  Sonographer:    Clayton Lefort RDCS (AE) Referring Phys:  [id number] Zeno Hickel J Sadonna Kotara  Sonographer Comments: COVID-19. IMPRESSIONS  1. Left ventricular ejection fraction, by visual estimation, is 70 to 75%. The left ventricle has normal function. There is moderately increased left ventricular hypertrophy.  2. Left ventricular diastolic parameters are consistent with Grade I diastolic dysfunction (impaired relaxation).  3. The left ventricle has no regional wall motion abnormalities.  4. Global right ventricle has normal systolic function.The right ventricular size is normal.  5. Left atrial size was normal.  6. Right atrial size was normal.  7. The mitral valve is normal in structure. No evidence of mitral valve regurgitation. No evidence of mitral stenosis.  8. The tricuspid valve is normal in structure.  9. The aortic valve is tricuspid. Aortic valve regurgitation is not visualized. Mild aortic valve sclerosis without stenosis. 10. The pulmonic valve was normal in structure. Pulmonic valve regurgitation is not visualized. 11. Moderately elevated pulmonary artery systolic pressure. 12. Technically difficult; vigorous LV systolic function; moderate LVH; grade 1 diastolic dysfunction. FINDINGS  Left Ventricle: Left ventricular ejection fraction, by visual estimation, is 70 to 75%. The left ventricle has normal function. The left ventricle has no regional wall motion abnormalities. There is moderately increased left ventricular hypertrophy. Left ventricular diastolic parameters are consistent with Grade I diastolic dysfunction (impaired relaxation). Normal left atrial pressure. Right Ventricle: The right ventricular size is normal.Global RV systolic function is has normal systolic function. The tricuspid regurgitant velocity is 3.11 m/s, and with an assumed right atrial pressure of 10 mmHg, the estimated right ventricular systolic pressure is moderately elevated at 48.6 mmHg. Left Atrium: Left atrial size was normal in size. Right Atrium: Right atrial size was normal in size  Pericardium: There is no evidence of pericardial effusion. Mitral Valve: The mitral valve is normal in structure.  No evidence of mitral valve regurgitation. No evidence of mitral valve stenosis by observation. Tricuspid Valve: The tricuspid valve is normal in structure. Tricuspid valve regurgitation is trivial. Aortic Valve: The aortic valve is tricuspid. Aortic valve regurgitation is not visualized. Mild aortic valve sclerosis is present, with no evidence of aortic valve stenosis. Pulmonic Valve: The pulmonic valve was normal in structure. Pulmonic valve regurgitation is not visualized. Pulmonic regurgitation is not visualized. Aorta: The aortic root is normal in size and structure. Venous: The inferior vena cava was not well visualized. IAS/Shunts: No atrial level shunt detected by color flow Doppler. Additional Comments: Technically difficult; vigorous LV systolic function; moderate LVH; grade 1 diastolic dysfunction.  LEFT VENTRICLE PLAX 2D LVIDd:         3.94 cm  Diastology LVIDs:         2.68 cm  LV e' lateral: 7.18 cm/s LV PW:         1.46 cm  LV e' medial:  5.44 cm/s LV IVS:        1.53 cm LVOT diam:     1.90 cm LV SV:         41 ml LV SV Index:   18.16 LVOT Area:     2.84 cm  RIGHT VENTRICLE RV S prime:     17.70 cm/s TAPSE (M-mode): 1.5 cm LEFT ATRIUM             Index       RIGHT ATRIUM           Index LA diam:        2.10 cm 0.99 cm/m  RA Area:     10.70 cm LA Vol (A2C):   45.0 ml 21.28 ml/m RA Volume:   21.80 ml  10.31 ml/m LA Vol (A4C):   28.7 ml 13.57 ml/m LA Biplane Vol: 36.1 ml 17.07 ml/m  AORTIC VALVE LVOT Vmax:   120.00 cm/s LVOT Vmean:  78.200 cm/s LVOT VTI:    0.144 m  AORTA Ao Root diam: 2.50 cm TRICUSPID VALVE TR Peak grad:   38.6 mmHg TR Vmax:        316.00 cm/s  SHUNTS Systemic VTI:  0.14 m Systemic Diam: 1.90 cm  Olga MillersBrian Crenshaw MD Electronically signed by Olga MillersBrian Crenshaw MD Signature Date/Time: 11/22/2019/1:36:24 PM    Final         Scheduled Meds: . apixaban  10 mg Oral BID    Followed by  . [START ON 11/30/2019] apixaban  5 mg Oral BID  . vitamin C  500 mg Oral Daily  . bisacodyl  5 mg Oral Daily  . docusate sodium  100 mg Oral BID  . insulin aspart  0-20 Units Subcutaneous TID WC  . insulin glargine  10 Units Subcutaneous Daily  . magnesium citrate  0.5 Bottle Oral Once  . methylPREDNISolone (SOLU-MEDROL) injection  60 mg Intravenous Daily  . metoprolol tartrate  25 mg Oral BID  . polyethylene glycol  17 g Oral Daily  . sodium chloride flush  3 mL Intravenous Q12H  . zinc sulfate  220 mg Oral Daily   Continuous Infusions: . sodium chloride    . ceFEPime (MAXIPIME) IV 200 mL/hr at 11/23/19 1600  . linezolid (ZYVOX) IV    . metronidazole       LOS: 12 days   The patient is critically ill with multiple organ systems failure and requires high complexity decision making for assessment and support, frequent evaluation and titration of therapies, application  of advanced monitoring technologies and extensive interpretation of multiple databases. Critical Care Time devoted to patient care services described in this note  Time spent: 40 minutes     Lezette Kitts, Roselind Messier, MD [facility] Pager [phone]  If 7PM-7AM, please contact night-coverage [email or web address] Password [facility] 11/23/2019, 4:42 PM

## 2019-11-24 LAB — BLOOD CULTURE ID PANEL (REFLEXED)

## 2019-11-24 LAB — COMPREHENSIVE METABOLIC PANEL
ALT: 76 U/L — ABNORMAL HIGH (ref 0–44)
AST: 36 U/L (ref 15–41)
Albumin: 2.8 g/dL — ABNORMAL LOW (ref 3.5–5.0)
Alkaline Phosphatase: 116 U/L (ref 38–126)
Anion gap: 14 (ref 5–15)
BUN: 63 mg/dL — ABNORMAL HIGH (ref 6–20)
CO2: 24 mmol/L (ref 22–32)
Calcium: 8.2 mg/dL — ABNORMAL LOW (ref 8.9–10.3)
Chloride: 92 mmol/L — ABNORMAL LOW (ref 98–111)
Creatinine, Ser: 1.31 mg/dL — ABNORMAL HIGH (ref 0.61–1.24)
GFR calc Af Amer: >60 mL/min (ref >60)
GFR calc non Af Amer: >60 mL/min (ref >60)
Glucose, Bld: 189 mg/dL — ABNORMAL HIGH (ref 70–99)
Potassium: 5.2 mmol/L — ABNORMAL HIGH (ref 3.5–5.1)
Sodium: 130 mmol/L — ABNORMAL LOW (ref 135–145)
Total Bilirubin: 1 mg/dL (ref 0.3–1.2)
Total Protein: 6.8 g/dL (ref 6.5–8.1)

## 2019-11-24 LAB — CBC WITH DIFFERENTIAL/PLATELET
Abs Immature Granulocytes: 0.82 10*3/uL — ABNORMAL HIGH (ref 0.00–0.07)
Basophils Absolute: 0.1 10*3/uL (ref 0.0–0.1)
Basophils Relative: 0 %
Eosinophils Absolute: 0 10*3/uL (ref 0.0–0.5)
Eosinophils Relative: 0 %
HCT: 40.1 % (ref 39.0–52.0)
Hemoglobin: 13.4 g/dL (ref 13.0–17.0)
Immature Granulocytes: 2 %
Lymphocytes Relative: 3 %
Lymphs Abs: 1.2 10*3/uL (ref 0.7–4.0)
MCH: 26.5 pg (ref 26.0–34.0)
MCHC: 33.4 g/dL (ref 30.0–36.0)
MCV: 79.2 fL — ABNORMAL LOW (ref 80.0–100.0)
Monocytes Absolute: 3.1 10*3/uL — ABNORMAL HIGH (ref 0.1–1.0)
Monocytes Relative: 9 %
Neutro Abs: 30.5 10*3/uL — ABNORMAL HIGH (ref 1.7–7.7)
Neutrophils Relative %: 86 %
Platelets: 185 10*3/uL (ref 150–400)
RBC: 5.06 MIL/uL (ref 4.22–5.81)
RDW: 15 % (ref 11.5–15.5)
WBC: 35.8 10*3/uL — ABNORMAL HIGH (ref 4.0–10.5)
nRBC: 0 % (ref 0.0–0.2)

## 2019-11-24 LAB — GLUCOSE, CAPILLARY
Glucose-Capillary: 230 mg/dL — ABNORMAL HIGH (ref 70–99)
Glucose-Capillary: 216 mg/dL — ABNORMAL HIGH (ref 70–99)
Glucose-Capillary: 258 mg/dL — ABNORMAL HIGH (ref 70–99)
Glucose-Capillary: 320 mg/dL — ABNORMAL HIGH (ref 70–99)
Glucose-Capillary: 316 mg/dL — ABNORMAL HIGH (ref 70–99)
Glucose-Capillary: 216 mg/dL — ABNORMAL HIGH (ref 70–99)

## 2019-11-24 LAB — MAGNESIUM: Magnesium: 4.2 mg/dL — ABNORMAL HIGH (ref 1.7–2.4)

## 2019-11-24 LAB — PHOSPHORUS: Phosphorus: 5 mg/dL — ABNORMAL HIGH (ref 2.5–4.6)

## 2019-11-24 LAB — PROTIME-INR
INR: 2.1 — ABNORMAL HIGH (ref 0.8–1.2)
Prothrombin Time: 23.7 seconds — ABNORMAL HIGH (ref 11.4–15.2)

## 2019-11-24 LAB — C-REACTIVE PROTEIN: CRP: 19.4 mg/dL — ABNORMAL HIGH (ref <1.0)

## 2019-11-24 LAB — D-DIMER, QUANTITATIVE: D-Dimer, Quant: 0.88 ug/mL-FEU — ABNORMAL HIGH (ref 0.00–0.50)

## 2019-11-24 LAB — PROCALCITONIN: Procalcitonin: 3.68 ng/mL

## 2019-11-24 LAB — FERRITIN: Ferritin: 1298 ng/mL — ABNORMAL HIGH (ref 24–336)

## 2019-11-24 MED ORDER — SODIUM POLYSTYRENE SULFONATE 15 GM/60ML PO SUSP
60.0000 g | Freq: Three times a day (TID) | ORAL | Status: AC
  Administered 2019-11-24: 60 g via ORAL
  Filled 2019-11-24: qty 240

## 2019-11-24 MED ORDER — INSULIN DETEMIR 100 UNIT/ML ~~LOC~~ SOLN
14.0000 [IU] | Freq: Two times a day (BID) | SUBCUTANEOUS | Status: DC
  Administered 2019-11-25 – 2019-11-26 (×3): 14 [IU] via SUBCUTANEOUS
  Filled 2019-11-24 (×5): qty 0.14

## 2019-11-24 MED ORDER — SODIUM POLYSTYRENE SULFONATE 15 GM/60ML PO SUSP: 60.0000 g | Freq: Three times a day (TID) | ORAL | Status: DC

## 2019-11-24 MED ORDER — CEFAZOLIN SODIUM-DEXTROSE 2-4 GM/100ML-% IV SOLN
2.0000 g | Freq: Three times a day (TID) | INTRAVENOUS | Status: DC
  Administered 2019-11-25 (×2): 2 g via INTRAVENOUS
  Filled 2019-11-24 (×4): qty 100

## 2019-11-24 NOTE — Progress Notes (Signed)
PHARMACY - PHYSICIAN COMMUNICATION CRITICAL VALUE ALERT - BLOOD CULTURE IDENTIFICATION (BCID)  Nathaniel Webb is an 54 y.o. male who presented to [facility] on 11/11/2019 with a chief complaint of COVID 19 pneumonia  Assessment:  Pt with MSSA bacteremia - likely source PNA  Name of physician (or Provider) Contacted: Dr. Dwana Curd  Current antibiotics: Zyvox, Cefepime, Flagyl  Changes to prescribed antibiotics recommended:  Narrow abx to Ancef 2gm IV q8h. ID will automatically be consulted  Results for orders placed or performed during the hospital encounter of 11/11/19  Blood Culture ID Panel (Reflexed) (Collected: 11/23/2019  2:15 PM)  Result Value Ref Range   Enterococcus species NOT DETECTED NOT DETECTED   Listeria monocytogenes NOT DETECTED NOT DETECTED   Staphylococcus species DETECTED (A) NOT DETECTED   Staphylococcus aureus (BCID) DETECTED (A) NOT DETECTED   Methicillin resistance NOT DETECTED NOT DETECTED   Streptococcus species NOT DETECTED NOT DETECTED   Streptococcus agalactiae NOT DETECTED NOT DETECTED   Streptococcus pneumoniae NOT DETECTED NOT DETECTED   Streptococcus pyogenes NOT DETECTED NOT DETECTED   Acinetobacter baumannii NOT DETECTED NOT DETECTED   Enterobacteriaceae species NOT DETECTED NOT DETECTED   Enterobacter cloacae complex NOT DETECTED NOT DETECTED   Escherichia coli NOT DETECTED NOT DETECTED   Klebsiella oxytoca NOT DETECTED NOT DETECTED   Klebsiella pneumoniae NOT DETECTED NOT DETECTED   Proteus species NOT DETECTED NOT DETECTED   Serratia marcescens NOT DETECTED NOT DETECTED   Haemophilus influenzae NOT DETECTED NOT DETECTED   Neisseria meningitidis NOT DETECTED NOT DETECTED   Pseudomonas aeruginosa NOT DETECTED NOT DETECTED   Candida albicans NOT DETECTED NOT DETECTED   Candida glabrata NOT DETECTED NOT DETECTED   Candida krusei NOT DETECTED NOT DETECTED   Candida parapsilosis NOT DETECTED NOT DETECTED   Candida tropicalis NOT DETECTED NOT  DETECTED    Christoper Fabian, PharmD, BCPS Please see amion for complete clinical pharmacist phone list 11/24/2019  10:13 PM

## 2019-11-24 NOTE — Progress Notes (Signed)
Inpatient Diabetes Program Recommendations  AACE/ADA: New Consensus Statement on Inpatient Glycemic Control (2015)  Target Ranges:  Prepandial:   less than 140 mg/dL      Peak postprandial:   less than 180 mg/dL (1-2 hours)      Critically ill patients:  140 - 180 mg/dL   Lab Results  Component Value Date   GLUCAP 320 (H) 11/24/2019   HGBA1C 7.3 (H) 11/10/2019    Review of Glycemic Control Results for Nathaniel Webb, Nathaniel Webb (MRN [id number]) as of 11/24/2019 13:55  Ref. Range 11/23/2019 15:33 11/23/2019 20:02 11/24/2019 05:15 11/24/2019 08:11 11/24/2019 11:56  Glucose-Capillary Latest Ref Range: 70 - 99 mg/dL 008 (H) 676 (H) 195 (H) 258 (H) 320 (H)   Diabetes history: DM 2 Outpatient Diabetes medications:  Novolog 0-9 units q 4 hours, Metformin 500 mg daily Current orders for Inpatient glycemic control:  Novolog resistant tid with meals Lantus 10 units daily Solumedrol 60 mg daily Inpatient Diabetes Program Recommendations:    Please consider d/c of Lantus and add Levemir 14 units bid.    Thanks, Beryl Meager, RN, BC-ADM Inpatient Diabetes Coordinator Pager [phone] (8a-5p)

## 2019-11-24 NOTE — Progress Notes (Signed)
PHARMACY - PHYSICIAN COMMUNICATION CRITICAL VALUE ALERT - BLOOD CULTURE IDENTIFICATION (BCID)  Nyaire Denbleyker Wickham is an 54 y.o. male who presented to [facility] on 11/11/2019 with a chief complaint of shortness of breath, COVID-19 pneumonia.  Assessment:  2 aerobic bottles growing GPC clusters, no BCID to be done due to national shortage  Name of physician (or Provider) Contacted: Dr. Joseph Art  Current antibiotics: Zyvox, Cefepime, Flagyl  Changes to prescribed antibiotics recommended:  Response not received from provider;  current antibiotics are likely to cover the isolated organism.  Consider de-escalation soon.  No results found for this or any previous visit.  Loralee Pacas, PharmD, BCPS Pharmacy: [phone] 11/24/2019  1:25 PM

## 2019-11-24 NOTE — Progress Notes (Addendum)
PROGRESS NOTE    Nathaniel MagesJohnny R Narine  ZOX:[id number] DOB: 01/21/1966 DOA: 11/11/2019 PCP: Rolm GalaIloabachie, Chioma E, NP   Brief Narrative:  54 year old BM PMHx diabetes type 2 uncontrolled with complication/diabetic neuropathy,, HTN, HLD, obesity  Presented to [facility] with approximately 1 week history of gradually worsening shortness of breath.  He was found to have acute hypoxic respiratory failure and was requiring high flow oxygen to maintain O2 saturations.  Chest x-ray was typical of Covid pneumonitis.    Patient stated had a URI symptoms, weakness that started before his shortness of breath.  He presented to the ED on 12/23 and was diagnosed with COVID-19-but was discharged home.    Subjective: 1/11 afebrile last night A/O x4, positive S OB.    Assessment & Plan:   Principal Problem:   Acute respiratory failure with hypoxemia (HCC) Active Problems:   Type 2 diabetes, controlled, with peripheral neuropathy (HCC)   Hypertension   Morbid obesity (HCC)   Pneumonia due to COVID-19 virus   Diabetes mellitus type 2, uncontrolled, with complications (HCC)   Acute pulmonary embolus (HCC)   Demand ischemia (HCC)   Constipation   Loculated pleural effusion   Covid pneumonia/acute respiratory failure with hypoxia  COVID-19 Labs  Recent Labs    11/22/19 0408 11/23/19 0429 11/24/19 0555  DDIMER 1.12* 0.86* 0.88*  FERRITIN 838* 1,244* 1,298*  CRP 11.6* 21.3* 19.4*    No results found for: SARSCOV2NAA 12/23 POC SARS coronavirus positive  -1/9 restart Solu-Medrol 60 mg daily -Remdesivir per pharmacy protocol -[facility] -Combivent -Flutter valve -Incentive spirometry -Titrate O2 to maintain SPO2> 88% -Prone patient for 16 hours/day; if patient cannot tolerate prone 2 to 3 hours per shift -PRN Lasix -1/9 Lasix IV 60 mg x 1 -1/10 procalcitonin elevated therefore not eligible for Actemra   ARDS/HCAP/Aspiration? -Patient's PCXR fits criteria for developing ARDS  bilateral increasing opacification however, respiratory status worsened today, However this was during the time Pt was having CP   -ABG essentially normal -Trend procalcitonin Results for Nathaniel MagesMORROW, Nathaniel R (MRN [id number]) as of 11/23/2019 12:43  Ref. Range 11/14/2019 01:30 11/14/2019 15:15 11/18/2019 16:11 11/23/2019 04:29  Procalcitonin Latest Units: ng/mL 0.33 0.33 <0.10 4.13  -1/10 cefepime x7 days.+ Metronidazole + linezolid.  Loculated pleural effusion -1/10 panculture -1/10 CT abdomen/pelvis; moderate stool burden, loculated RIGHT pleural effusion see results below -1/10 n.p.o. until swallow evaluation  Loculated RIGHT pleural effusion -New not present on CT angio chest PE protocol 12/29 -1/11 CTS consulted Dr. Donata ClayVan Trigt believes there is effusion but more collapsed lung, feels more appropriate for chest tube. -1/11 discussed case be Brandi Ollis/Dr. Vassie LollAlva PCCM will place chest tube in a.m. DC Eliquis   Acute PE -CTA chest PE protocol positive for PE see results below -Bilateral lower extremity Dopplers negative for DVT -1/8 Eliquis per pharmacy; 1/11 (hold) chest tube placement in a.m.  Chronic diastolic CHF/LVH -Strict in and out -5.1 L -Daily weight Filed Weights   11/11/19 1500 11/23/19 0421  Weight: 106.1 kg 95.5 kg  -DC metoprolol IV -1/10 increase Metoprolol 25 mg  BID  Essential HTN -Currently controlled  Chest pain -1/8 substernal radiates 8/10 -NTG x1-->6/10, repeat NTG (hold on morphine unless absolutely necessary secondary to patient's constipation) -Results for Nathaniel MagesMORROW, Nathaniel R (MRN [id number]) as of 11/22/2019 09:02  Ref. Range 11/21/2019 14:00 11/21/2019 15:35  Troponin I (High Sensitivity) Latest Ref Range: <18 ng/L 9 11   -1/8 EKG; nonspecific ST-T wave changes, LVH -1/8 PCXR; worsening opacification see results  below.  See ARDS -Echocardiogram; consistent with diastolic CHF see results below -1/9 PCXR per radiologist no substantial change however I believe left lung  field slightly improved.  Diabetes type 2 uncontrolled with complication -12/28 hemoglobin A1c= 7.3 -1/11 Levemir 14 units BID -Resistant SSI  Mild transaminitis -Most likely secondary to Covid, remdesivir, and Actemra monitor closely.    Minimally elevated troponin level: Demand ischemia -Downtrending-EKG reassuring-likely demand ischemia.  Doubt any further work-up is required.  Hyperkalemia -Kayexalate 60 g TID  Hyponatremia -Mild asymptomatic will currently monitor  Constipation -Colace -1/9 MiraLAX daily -1/9 Bisacodyl 5mg  daily  -1/8 KUB; negative SBO/ileus.   -Magnesium citrate 1/2 bottle  Goals of care -On 1/2 requested that LCSW [facility]; start on Eliquis may obtain free medication medication MGMT     DVT prophylaxis: Full dose Lovenox--> Eliquis Code Status: Full Family Communication: 11/21/2019 spoke with 01/19/2020 (wife) counseled her on plan of care answered all questions Disposition Plan:    Consultants:  Phone consult cardiothoracic surgery PCCM    Procedures/Significant Events:  Actemra received at [facility] 12/29 CTA chest PE protocol; positive PE small volume of somewhat eccentric appearing embolus present in the distal right pulmonary artery, right-sided lobar lobar, and some proximal segmental branches. No left-sided embolus noted. -Extensive ground-glass and heterogeneous airspace opacity throughout the lungs bilaterally, most conspicuous in the dependent left lung. -CAD- Hepatic steatosis. 12/29 bilateral lower extremity Doppler negative for DVT 1/1 PCXR; Diffuse bilateral airspace opacities consistent with the given clinical history of COVID-19 positivity. Stable  1/8 PCXR;-interval worsening right lung opacities with persistent left mid and lower lung opacities. 1/8 KUB;-stool noted throughout the colon. No bowel distention or free air. -Bibasilar atelectasis/infiltrates again noted. Right pleural effusion cannot be excluded 1/9 PCXR;-no  substantial change in exam.  -Diffuse airspace disease in the right lung and left mid and lower lung involvement, compatible with multifocal pneumonia.  (To me left lung looks slightly improved) 1/9 Echocardiogram;Left Ventricle: EF 70 to 75%. - moderately increased left ventricular hypertrophy.  - Grade I diastolic dysfunction (impaired relaxation). Normal left atrial pressure. Right Ventricle: Tricuspid regurgitant velocity is 3.11 m/s, and with an assumed right atrial pressure of 10 mmHg, the estimated right ventricular  systolic pressure is moderately elevated at 48.6 mmHg. 1/10 CT abdomen/pelvis W0 contrast;-moderate stool burden throughout the colon. -Aiirspace disease in both lower lobes, right greater than left compatible with pneumonia.  -Loculated right pleural effusion. -Rim calcified structure in the right lower quadrant this may be related to fat necrosis or old omental infarct. 1/10 CT chest W. Wo contrast;-multiloculated right lung hydropneumothorax, with a somewhat larger volume of pleural fluid relative to pleural air. -progressed multilobar right lung consolidation since the Chest CT on 11/11/2019. - Small volume loculated right side pneumomediastinum is new since December. -. Regressed but not resolved left lung opacity since December due to COVID pneumonia.       I have personally reviewed and interpreted all radiology studies and my findings are as above.  VENTILATOR SETTINGS: HFNC+ NRB 1/11 Flow; 15 L/min SPO2 94%    Cultures 12/23 POC SARS coronavirus positive 12/28 HIV screen negative 1/10 urine pending 1/10 blood pending 1/10 sputum pending   Antimicrobials: Anti-infectives (From admission, onward)   Start     Dose/Rate Stop   11/12/19 1000  remdesivir 100 mg in sodium chloride 0.9 % 100 mL IVPB  Status:  Discontinued     100 mg 200 mL/hr over 30 Minutes 11/11/19 1238   11/12/19 1000  remdesivir 100 mg in sodium chloride 0.9 % 100 mL IVPB     100  mg 200 mL/hr over 30 Minutes 11/15/19 0959   11/11/19 1330  cefTRIAXone (ROCEPHIN) 1 g in sodium chloride 0.9 % 100 mL IVPB     1 g 200 mL/hr over 30 Minutes 11/16/19 1314   11/11/19 1330  azithromycin (ZITHROMAX) 500 mg in sodium chloride 0.9 % 250 mL IVPB     500 mg 250 mL/hr over 60 Minutes 11/14/19 1329   11/11/19 1230  remdesivir 200 mg in sodium chloride 0.9% 250 mL IVPB  Status:  Discontinued     200 mg 580 mL/hr over 30 Minutes 11/11/19 1238       Devices    LINES / TUBES:      Continuous Infusions: . sodium chloride    . ceFEPime (MAXIPIME) IV 2 g (11/24/19 0556)  . linezolid (ZYVOX) IV 300 mL/hr at 11/24/19 0740  . metronidazole Stopped (11/24/19 0548)     Objective: Vitals:   11/23/19 1531 11/23/19 2232 11/24/19 0004 11/24/19 0813  BP: 113/69 (!) 133/94 126/85 115/76  Pulse: 97 (!) 102  96  Resp: 19     Temp: 97.9 F (36.6 C)  98.9 F (37.2 C)   TempSrc: Oral  Oral   SpO2: 93%     Weight:      Height:        Intake/Output Summary (Last 24 hours) at 11/24/2019 0829 Last data filed at 11/24/2019 0740 Gross per 24 hour  Intake 773.21 ml  Output 800 ml  Net -26.79 ml   Filed Weights   11/11/19 1500 11/23/19 0421  Weight: 106.1 kg 95.5 kg   Physical Exam:  General: A/O x4, positive acute respiratory distress Eyes: negative scleral hemorrhage, negative anisocoria, negative icterus ENT: Negative Runny nose, negative gingival bleeding, Neck:  Negative scars, masses, torticollis, lymphadenopathy, JVD Lungs: Tachypneic absent RIGHT lung field breath sounds, decreased LEFT lung field breath sounds without wheezes or crackles Cardiovascular: Tachycardic without murmur gallop or rub normal S1 and S2 Abdomen: negative abdominal pain, nondistended, positive soft, bowel sounds, no rebound, no ascites, no appreciable mass Extremities: No significant cyanosis, clubbing, or edema bilateral lower extremities Skin: Negative rashes, lesions, ulcers Psychiatric:   Negative depression, negative anxiety, negative fatigue, negative mania  Central nervous system:  Cranial nerves II through XII intact, tongue/uvula midline, all extremities muscle strength 5/5, sensation intact throughout, negative dysarthria, negative expressive aphasia, negative receptive aphasia.         .     Data Reviewed: Care during the described time interval was provided by me .  I have reviewed this patient's available data, including medical history, events of note, physical examination, and all test results as part of my evaluation.   CBC: Recent Labs  Lab 11/20/19 0050 11/21/19 0105 11/21/19 1846 11/22/19 0408 11/23/19 0429 11/24/19 0555  WBC 27.2* 25.9*  --  49.2* 46.2* 35.8*  NEUTROABS 22.2* 22.0*  --  43.9* 40.8* 30.5*  HGB 15.1 14.6 17.0 15.1 14.3 13.4  HCT 47.4 46.3 50.0 46.4 43.8 40.1  MCV 81.9 81.9  --  81.4 80.1 79.2*  PLT 258 220  --  220 217 185   Basic Metabolic Panel: Recent Labs  Lab 11/20/19 0050 11/21/19 0105 11/21/19 1846 11/22/19 0408 11/23/19 0429 11/24/19 0555  NA 133* 133* 127* 132* 128* 130*  K 4.3 4.4 4.7 4.9 5.3* 5.2*  CL 97* 94*  --  91* 87* 92*  CO2 24 21*  --  27 27 24   GLUCOSE 146* 142*  --  169* 179* 189*  BUN 26* 23*  --  33* 50* 63*  CREATININE 0.99 0.80  --  1.17 1.28* 1.31*  CALCIUM 8.7* 8.6*  --  8.4* 8.3* 8.2*  MG 2.6* 2.5*  --  3.5* 3.5* 4.2*  PHOS 3.2 3.4  --  4.4 4.7* 5.0*   GFR: Estimated Creatinine Clearance: 70.1 mL/min (A) (by C-G formula based on SCr of 1.31 mg/dL (H)). Liver Function Tests: Recent Labs  Lab 11/20/19 0050 11/21/19 0105 11/22/19 0408 11/23/19 0429 11/24/19 0555  AST 40 36 43* 29 36  ALT 84* 82* 106* 68* 76*  ALKPHOS 101 93 115 133* 116  BILITOT 1.0 1.0 0.8 1.0 1.0  PROT 7.6 7.2 7.0 6.9 6.8  ALBUMIN 3.9 3.7 3.5 3.0* 2.8*   No results for input(s): LIPASE, AMYLASE in the last 168 hours. No results for input(s): AMMONIA in the last 168 hours. Coagulation Profile: Recent Labs    Lab 11/20/19 0050 11/21/19 0105 11/22/19 0408 11/23/19 0429 11/24/19 0555  INR 1.8* 2.5* 2.3* 1.7* 2.1*   Cardiac Enzymes: No results for input(s): CKTOTAL, CKMB, CKMBINDEX, TROPONINI in the last 168 hours. BNP (last 3 results) No results for input(s): PROBNP in the last 8760 hours. HbA1C: No results for input(s): HGBA1C in the last 72 hours. CBG: Recent Labs  Lab 11/23/19 1119 11/23/19 1533 11/23/19 2002 11/24/19 0515 11/24/19 0811  GLUCAP 269* 336* 261* 216* 258*   Lipid Profile: No results for input(s): CHOL, HDL, LDLCALC, TRIG, CHOLHDL, LDLDIRECT in the last 72 hours. Thyroid Function Tests: No results for input(s): TSH, T4TOTAL, FREET4, T3FREE, THYROIDAB in the last 72 hours. Anemia Panel: Recent Labs    11/23/19 0429 11/24/19 0555  FERRITIN 1,244* 1,298*   Urine analysis:    Component Value Date/Time   COLORURINE YELLOW (A) 07/08/2018 1113   APPEARANCEUR CLEAR (A) 07/08/2018 1113   LABSPEC 1.013 07/08/2018 1113   PHURINE 5.0 07/08/2018 1113   GLUCOSEU NEGATIVE 07/08/2018 1113   HGBUR NEGATIVE 07/08/2018 1113   BILIRUBINUR NEGATIVE 07/08/2018 1113   KETONESUR NEGATIVE 07/08/2018 1113   PROTEINUR NEGATIVE 07/08/2018 1113   NITRITE NEGATIVE 07/08/2018 1113   LEUKOCYTESUR NEGATIVE 07/08/2018 1113   Sepsis Labs: @LABRCNTIP (procalcitonin:4,lacticidven:4)  ) Recent Results (from the past 240 hour(s))  Culture, blood (routine x 2)     Status: None (Preliminary result)   Collection Time: 11/23/19  2:10 PM   Specimen: BLOOD  Result Value Ref Range Status   Specimen Description   Final    BLOOD LEFT ARM Performed at [facility], [address], [place], [place] [address]    Special Requests   Final    BOTTLES DRAWN AEROBIC ONLY Blood Culture adequate volume Performed at [facility], [address] [place], [place] [address]    Culture   Final    NO GROWTH < 12 HOURS Performed at [facility], [address] [place], [place] [address]    Report Status PENDING  Incomplete  Culture, blood (routine x 2)     Status: None (Preliminary result)   Collection Time: 11/23/19  2:15 PM   Specimen: BLOOD  Result Value Ref Range Status   Specimen Description   Final    BLOOD LEFT HAND Performed at [facility], [address], [place], [place] [address]    Special Requests   Final    BOTTLES DRAWN AEROBIC ONLY Blood Culture adequate volume  Performed at [facility], [address] [place], [place] [address]    Culture   Final    NO GROWTH < 12 HOURS Performed at [facility], [address] [place], [place] [address]    Report Status PENDING  Incomplete         Radiology Studies: CT ABDOMEN PELVIS WO CONTRAST  Result Date: 11/23/2019 CLINICAL DATA:  Intra-abdominal infection/peritonitis EXAM: CT ABDOMEN AND PELVIS WITHOUT CONTRAST TECHNIQUE: Multidetector CT imaging of the abdomen and pelvis was performed following the standard protocol without IV contrast. COMPARISON:  None. FINDINGS: Lower chest: Extensive airspace disease in the lower lungs compatible with pneumonia. Small to moderate loculated right pleural effusion. Heart is normal size. Hepatobiliary: No focal hepatic abnormality. Gallbladder unremarkable. Pancreas: No focal abnormality or ductal dilatation. Spleen: No focal abnormality.  Normal size. Adrenals/Urinary Tract: No adrenal abnormality. No focal renal abnormality. No stones or hydronephrosis. Urinary bladder is unremarkable. Stomach/Bowel: Moderate stool burden in the colon. No evidence of bowel obstruction. Stomach is mildly distended with fluid. Vascular/Lymphatic: Aortic atherosclerosis. No enlarged abdominal or pelvic lymph nodes. Reproductive: No visible focal abnormality. Other: No free fluid or free air. Rim calcified structure noted in the right lower quadrant measuring 4.4 x 3.8 cm. Musculoskeletal: No acute bony abnormality.  IMPRESSION: Moderate stool burden throughout the colon. Airspace disease in both lower lobes, right greater than left compatible with pneumonia. Loculated right pleural effusion. Aortic atherosclerosis. Rim calcified structure in the right lower quadrant this may be related to fat necrosis or old omental infarct. Electronically Signed   By: Charlett NoseKevin  Dover M.D.   On: 11/23/2019 16:00   CT CHEST WO CONTRAST  Addendum Date: 11/23/2019   ADDENDUM REPORT: 11/23/2019 21:59 ADDENDUM: Study discussed by telephone with Dr. Dwana CurdVera on 11/23/2019 at 2155 hours. Electronically Signed   By: Odessa FlemingH  Hall M.D.   On: 11/23/2019 21:59   Result Date: 11/23/2019 CLINICAL DATA:  54 year old male COVID-19. Loculated right pleural effusion on CT Abdomen and Pelvis earlier today. EXAM: CT CHEST WITHOUT CONTRAST TECHNIQUE: Multidetector CT imaging of the chest was performed following the standard protocol without IV contrast. COMPARISON:  CT Abdomen and Pelvis 1317 hours today. Chest CT 11/11/2019. FINDINGS: Cardiovascular: Calcified coronary artery atherosclerosis. No cardiomegaly or pericardial effusion. Vascular patency is not evaluated in the absence of IV contrast. Mediastinum/Nodes: No mediastinal lymphadenopathy. Small volume of loculated pneumomediastinum is demonstrated on series 3, image 33 - new from December. Lungs/Pleura: Multiloculated right lung hydropneumothorax with a moderate to large volume of pleural fluid and small to moderate volume of pleural air. Superimposed are multifocal right lung consolidation and peribronchial ground-glass opacity in the non consolidated right lung. Central airways remain patent. Patchy widespread ground-glass opacity in the left lung has regressed since 11/11/2019. No left pleural effusion. Upper Abdomen: Stable from the earlier CT Abdomen and Pelvis. Musculoskeletal: No acute osseous abnormality identified. IMPRESSION: 1. Multiloculated right lung hydropneumothorax, with a somewhat larger volume  of pleural fluid relative to pleural air. 2. Underlying progressed multilobar right lung consolidation since the Chest CT on 11/11/2019. 3. Small volume loculated right side pneumomediastinum is new since December. 4. Regressed but not resolved left lung opacity since December due to COVID pneumonia. Electronically Signed: By: Odessa FlemingH  Hall M.D. On: 11/23/2019 21:43   DG CHEST PORT 1 VIEW  Result Date: 11/22/2019 CLINICAL DATA:  ARDS. EXAM: PORTABLE CHEST 1 VIEW COMPARISON:  11/21/2019 FINDINGS: 0911 hours. Low volumes. Cardiopericardial silhouette is at upper limits of normal for  size. Diffuse airspace disease in the right lung is similar with patchy airspace opacity in the left mid and lower lung. No substantial pleural effusion or pneumothorax. The visualized bony structures of the thorax are intact. Telemetry leads overlie the chest. IMPRESSION: No substantial change in exam. Diffuse airspace disease in the right lung and left mid and lower lung involvement, compatible with multifocal pneumonia. Electronically Signed   By: Kennith Center M.D.   On: 11/22/2019 09:49   ECHOCARDIOGRAM COMPLETE  Result Date: 11/22/2019   ECHOCARDIOGRAM REPORT   Patient Name:   Nathaniel Webb Date of Exam: 11/22/2019 Medical Rec #:  [id number]       Height:       65.0 in Accession #:    [id number]      Weight:       234.0 lb Date of Birth:  Oct 23, 1966       BSA:          2.11 m Patient Age:    53 years        BP:           124/85 mmHg Patient Gender: M               HR:           104 bpm. Exam Location:  Inpatient Procedure: 2D Echo Indications:    Chest Pain 786.50 / R07.9  History:        Patient has no prior history of Echocardiogram examinations.                 Signs/Symptoms:Chest Pain; Risk Factors:Diabetes, Hypertension                 and Dyslipidemia. COVID-19.  Sonographer:    Ross Ludwig RDCS (AE) Referring Phys: [id number] Mitsuo Budnick J Kyren Vaux  Sonographer Comments: COVID-19. IMPRESSIONS  1. Left ventricular ejection fraction, by  visual estimation, is 70 to 75%. The left ventricle has normal function. There is moderately increased left ventricular hypertrophy.  2. Left ventricular diastolic parameters are consistent with Grade I diastolic dysfunction (impaired relaxation).  3. The left ventricle has no regional wall motion abnormalities.  4. Global right ventricle has normal systolic function.The right ventricular size is normal.  5. Left atrial size was normal.  6. Right atrial size was normal.  7. The mitral valve is normal in structure. No evidence of mitral valve regurgitation. No evidence of mitral stenosis.  8. The tricuspid valve is normal in structure.  9. The aortic valve is tricuspid. Aortic valve regurgitation is not visualized. Mild aortic valve sclerosis without stenosis. 10. The pulmonic valve was normal in structure. Pulmonic valve regurgitation is not visualized. 11. Moderately elevated pulmonary artery systolic pressure. 12. Technically difficult; vigorous LV systolic function; moderate LVH; grade 1 diastolic dysfunction. FINDINGS  Left Ventricle: Left ventricular ejection fraction, by visual estimation, is 70 to 75%. The left ventricle has normal function. The left ventricle has no regional wall motion abnormalities. There is moderately increased left ventricular hypertrophy. Left ventricular diastolic parameters are consistent with Grade I diastolic dysfunction (impaired relaxation). Normal left atrial pressure. Right Ventricle: The right ventricular size is normal.Global RV systolic function is has normal systolic function. The tricuspid regurgitant velocity is 3.11 m/s, and with an assumed right atrial pressure of 10 mmHg, the estimated right ventricular systolic pressure is moderately elevated at 48.6 mmHg. Left Atrium: Left atrial size was normal in size. Right Atrium: Right atrial size was normal in size Pericardium: There is  no evidence of pericardial effusion. Mitral Valve: The mitral valve is normal in structure.  No evidence of mitral valve regurgitation. No evidence of mitral valve stenosis by observation. Tricuspid Valve: The tricuspid valve is normal in structure. Tricuspid valve regurgitation is trivial. Aortic Valve: The aortic valve is tricuspid. Aortic valve regurgitation is not visualized. Mild aortic valve sclerosis is present, with no evidence of aortic valve stenosis. Pulmonic Valve: The pulmonic valve was normal in structure. Pulmonic valve regurgitation is not visualized. Pulmonic regurgitation is not visualized. Aorta: The aortic root is normal in size and structure. Venous: The inferior vena cava was not well visualized. IAS/Shunts: No atrial level shunt detected by color flow Doppler. Additional Comments: Technically difficult; vigorous LV systolic function; moderate LVH; grade 1 diastolic dysfunction.  LEFT VENTRICLE PLAX 2D LVIDd:         3.94 cm  Diastology LVIDs:         2.68 cm  LV e' lateral: 7.18 cm/s LV PW:         1.46 cm  LV e' medial:  5.44 cm/s LV IVS:        1.53 cm LVOT diam:     1.90 cm LV SV:         41 ml LV SV Index:   18.16 LVOT Area:     2.84 cm  RIGHT VENTRICLE RV S prime:     17.70 cm/s TAPSE (M-mode): 1.5 cm LEFT ATRIUM             Index       RIGHT ATRIUM           Index LA diam:        2.10 cm 0.99 cm/m  RA Area:     10.70 cm LA Vol (A2C):   45.0 ml 21.28 ml/m RA Volume:   21.80 ml  10.31 ml/m LA Vol (A4C):   28.7 ml 13.57 ml/m LA Biplane Vol: 36.1 ml 17.07 ml/m  AORTIC VALVE LVOT Vmax:   120.00 cm/s LVOT Vmean:  78.200 cm/s LVOT VTI:    0.144 m  AORTA Ao Root diam: 2.50 cm TRICUSPID VALVE TR Peak grad:   38.6 mmHg TR Vmax:        316.00 cm/s  SHUNTS Systemic VTI:  0.14 m Systemic Diam: 1.90 cm  Olga Millers MD Electronically signed by Olga Millers MD Signature Date/Time: 11/22/2019/1:36:24 PM    Final         Scheduled Meds: . apixaban  10 mg Oral BID   Followed by  . [START ON 11/30/2019] apixaban  5 mg Oral BID  . vitamin C  500 mg Oral Daily  . bisacodyl  5 mg  Oral Daily  . docusate sodium  100 mg Oral BID  . insulin aspart  0-20 Units Subcutaneous TID WC  . insulin glargine  10 Units Subcutaneous Daily  . methylPREDNISolone (SOLU-MEDROL) injection  60 mg Intravenous Daily  . metoprolol tartrate  25 mg Oral BID  . polyethylene glycol  17 g Oral Daily  . sodium chloride flush  3 mL Intravenous Q12H  . zinc sulfate  220 mg Oral Daily   Continuous Infusions: . sodium chloride    . ceFEPime (MAXIPIME) IV 2 g (11/24/19 0556)  . linezolid (ZYVOX) IV 300 mL/hr at 11/24/19 0740  . metronidazole Stopped (11/24/19 0548)     LOS: 13 days   The patient is critically ill with multiple organ systems failure and requires high complexity decision making for assessment  and support, frequent evaluation and titration of therapies, application of advanced monitoring technologies and extensive interpretation of multiple databases. Critical Care Time devoted to patient care services described in this note  Time spent: 40 minutes     Simranjit Thayer, Geraldo Docker, MD [facility] Pager [phone]  If 7PM-7AM, please contact night-coverage [email or web address] Password [facility] 11/24/2019, 8:29 AM

## 2019-11-24 NOTE — Plan of Care (Signed)
The patient taken to get CT after coaching. Spoke with wife about update. Will continue to monitor patient for changes.

## 2019-11-24 NOTE — Evaluation (Signed)
Clinical/Bedside Swallow Evaluation Patient Details  Name: Nathaniel Webb MRN: [id number] Date of Birth: Sep 07, 1966  Today's Date: 11/24/2019 Time: SLP Start Time (ACUTE ONLY): 0901 SLP Stop Time (ACUTE ONLY): 0943 SLP Time Calculation (min) (ACUTE ONLY): 42 min  Past Medical History:  Past Medical History:  Diagnosis Date  . Diabetes mellitus without complication (HCC)   . Heel pain 06/09/2015  . High triglycerides 05/09/2016  . Hypertension   . Low HDL (under 40) 05/09/2016  . Obesity, Class II, BMI 35-39.9, with comorbidity (HCC) 01/04/2016   Past Surgical History: History reviewed. No pertinent surgical history. HPI:  54 y.o. male with PMHx diabetes type 2 uncontrolled with complication/diabetic neuropathy, HTN, HLD, obesity presented to [facility] 12/29 with acute hypoxic respiratory failure, requiring HFNC to maintain 02 saturations. Dx'd with COVID-19 on 12/23, PE.    1/10 CXR showed ARDS bilateral increasing opacification; respiratory status worsened; concerns for aspiration. Pt was made NPO pending swallow evaluation.   Assessment / Plan / Recommendation Clinical Impression  Pt presents with concerns for an acute dysphagia relative to respiratory and swallowing dyssynchrony and high oxygen demands.  Pt currently on 15L HFNC; 15L NRB.  RR ranges mid 20s to 40.  Pt removed and replaced NRB intermittently throughout assessment; minimal mobility in bed led to increases in RR and O2 demand. He demonstrates concerns for aspiration with thin liquids, likely related to shortened period of laryngeal closure during the swallow.  This shorter duration of closure creates more opportunity to aspirate immediately before or after the swallow.  DIscussed with pt the potential for aspiration; recommend changing liquids to nectar-thick for now.  Reviewed the reasons for heightened aspiration risk multiple times; pt had difficulty verbalizing after review.  Posted signs at [facility] and within pts view with cues to  slow down during meals and take frequent rest breaks.  SLP will follow for clinical improvements and/or instrumental study if warranted.    Orders written for regular solids, nectar liquids.  SLP Visit Diagnosis: Dysphagia, unspecified (R13.10)    Aspiration Risk  Mild aspiration risk    Diet Recommendation   regular solids, nectar thick liquids  Medication Administration: Whole meds with puree    Other  Recommendations Oral Care Recommendations: Oral care BID   Follow up Recommendations None      Frequency and Duration min 3x week  2 weeks       Prognosis        Swallow Study   General Date of Onset: 11/11/19 HPI: 54 y.o. male with PMHx diabetes type 2 uncontrolled with complication/diabetic neuropathy, HTN, HLD, obesity presented to [facility] 12/29 with acute hypoxic respiratory failure, requiring HFNC to maintain 02 saturations. Dx'd with COVID-19 on 12/23, PE.    1/10 CXR showed ARDS bilateral increasing opacification; respiratory status worsened; concerns for aspiration. Pt was made NPO pending swallow evaluation. Type of Study: Bedside Swallow Evaluation Previous Swallow Assessment: no Diet Prior to this Study: NPO Temperature Spikes Noted: No Respiratory Status: Non-rebreather;Nasal cannula History of Recent Intubation: No Behavior/Cognition: Alert;Cooperative Oral Cavity Assessment: Within Functional Limits Oral Care Completed by SLP: Recent completion by staff Oral Cavity - Dentition: Adequate natural dentition Vision: Functional for self-feeding Self-Feeding Abilities: Able to feed self Patient Positioning: Upright in bed Baseline Vocal Quality: Normal Volitional Cough: Strong Volitional Swallow: Able to elicit    Oral/Motor/Sensory Function Overall Oral Motor/Sensory Function: Within functional limits   Ice Chips Ice chips: Within functional limits   Thin Liquid Thin Liquid: Impaired Presentation: Self Fed;Cup  Pharyngeal  Phase Impairments: Cough - Immediate     Nectar Thick Nectar Thick Liquid: Within functional limits   Honey Thick Honey Thick Liquid: Not tested   Puree Puree: Within functional limits   Solid     Solid: Within functional limits      Juan Quam Laurice 11/24/2019,9:51 AM Estill Bamberg L. Tivis Ringer, MA CCC/SLP Acute Rehabilitation Services Office number [phone]  * Available via Secure Chat or SLP phone: [phone]

## 2019-11-25 ENCOUNTER — Inpatient Hospital Stay (HOSPITAL_COMMUNITY): Payer: Medicaid Other

## 2019-11-25 DIAGNOSIS — Z87891 Personal history of nicotine dependence: Secondary | ICD-10-CM

## 2019-11-25 DIAGNOSIS — K5900 Constipation, unspecified: Secondary | ICD-10-CM

## 2019-11-25 DIAGNOSIS — J869 Pyothorax without fistula: Secondary | ICD-10-CM

## 2019-11-25 DIAGNOSIS — R7881 Bacteremia: Secondary | ICD-10-CM | POA: Diagnosis present

## 2019-11-25 DIAGNOSIS — E118 Type 2 diabetes mellitus with unspecified complications: Secondary | ICD-10-CM

## 2019-11-25 DIAGNOSIS — J9601 Acute respiratory failure with hypoxia: Secondary | ICD-10-CM

## 2019-11-25 DIAGNOSIS — J90 Pleural effusion, not elsewhere classified: Secondary | ICD-10-CM

## 2019-11-25 DIAGNOSIS — U071 COVID-19: Principal | ICD-10-CM

## 2019-11-25 DIAGNOSIS — B9561 Methicillin susceptible Staphylococcus aureus infection as the cause of diseases classified elsewhere: Secondary | ICD-10-CM | POA: Diagnosis present

## 2019-11-25 DIAGNOSIS — N179 Acute kidney failure, unspecified: Secondary | ICD-10-CM

## 2019-11-25 DIAGNOSIS — E669 Obesity, unspecified: Secondary | ICD-10-CM

## 2019-11-25 DIAGNOSIS — I2699 Other pulmonary embolism without acute cor pulmonale: Secondary | ICD-10-CM

## 2019-11-25 HISTORY — DX: Bacteremia: R78.81

## 2019-11-25 HISTORY — DX: Methicillin susceptible Staphylococcus aureus infection as the cause of diseases classified elsewhere: B95.61

## 2019-11-25 LAB — C-REACTIVE PROTEIN: CRP: 23.3 mg/dL — ABNORMAL HIGH (ref <1.0)

## 2019-11-25 LAB — CBC WITH DIFFERENTIAL/PLATELET
Abs Immature Granulocytes: 0.22 10*3/uL — ABNORMAL HIGH (ref 0.00–0.07)
Basophils Absolute: 0.1 10*3/uL (ref 0.0–0.1)
Basophils Relative: 0 %
Eosinophils Absolute: 0.1 10*3/uL (ref 0.0–0.5)
Eosinophils Relative: 0 %
HCT: 37.6 % — ABNORMAL LOW (ref 39.0–52.0)
Hemoglobin: 12.3 g/dL — ABNORMAL LOW (ref 13.0–17.0)
Immature Granulocytes: 1 %
Lymphocytes Relative: 3 %
Lymphs Abs: 0.8 10*3/uL (ref 0.7–4.0)
MCH: 25.9 pg — ABNORMAL LOW (ref 26.0–34.0)
MCHC: 32.7 g/dL (ref 30.0–36.0)
MCV: 79.3 fL — ABNORMAL LOW (ref 80.0–100.0)
Monocytes Absolute: 2.4 10*3/uL — ABNORMAL HIGH (ref 0.1–1.0)
Monocytes Relative: 9 %
Neutro Abs: 23.2 10*3/uL — ABNORMAL HIGH (ref 1.7–7.7)
Neutrophils Relative %: 87 %
Platelets: 208 10*3/uL (ref 150–400)
RBC: 4.74 MIL/uL (ref 4.22–5.81)
RDW: 15.1 % (ref 11.5–15.5)
WBC: 26.6 10*3/uL — ABNORMAL HIGH (ref 4.0–10.5)
nRBC: 0 % (ref 0.0–0.2)

## 2019-11-25 LAB — COMPREHENSIVE METABOLIC PANEL
ALT: 88 U/L — ABNORMAL HIGH (ref 0–44)
AST: 36 U/L (ref 15–41)
Albumin: 2.5 g/dL — ABNORMAL LOW (ref 3.5–5.0)
Alkaline Phosphatase: 122 U/L (ref 38–126)
Anion gap: 14 (ref 5–15)
BUN: 62 mg/dL — ABNORMAL HIGH (ref 6–20)
CO2: 28 mmol/L (ref 22–32)
Calcium: 7.9 mg/dL — ABNORMAL LOW (ref 8.9–10.3)
Chloride: 96 mmol/L — ABNORMAL LOW (ref 98–111)
Creatinine, Ser: 1.42 mg/dL — ABNORMAL HIGH (ref 0.61–1.24)
GFR calc Af Amer: >60 mL/min (ref >60)
GFR calc non Af Amer: 56 mL/min — ABNORMAL LOW (ref >60)
Glucose, Bld: 205 mg/dL — ABNORMAL HIGH (ref 70–99)
Potassium: 4.1 mmol/L (ref 3.5–5.1)
Sodium: 138 mmol/L (ref 135–145)
Total Bilirubin: 0.8 mg/dL (ref 0.3–1.2)
Total Protein: 7.1 g/dL (ref 6.5–8.1)

## 2019-11-25 LAB — HEPARIN LEVEL (UNFRACTIONATED): Heparin Unfractionated: 0.78 IU/mL — ABNORMAL HIGH (ref 0.30–0.70)

## 2019-11-25 LAB — PROTIME-INR
INR: 1.3 — ABNORMAL HIGH (ref 0.8–1.2)
Prothrombin Time: 16.5 seconds — ABNORMAL HIGH (ref 11.4–15.2)

## 2019-11-25 LAB — BODY FLUID CELL COUNT WITH DIFFERENTIAL
Lymphs, Fluid: 2 %
Monocyte-Macrophage-Serous Fluid: 13 % — ABNORMAL LOW (ref 50–90)
Neutrophil Count, Fluid: 85 % — ABNORMAL HIGH (ref 0–25)
Total Nucleated Cell Count, Fluid: 117299 cu mm — ABNORMAL HIGH (ref 0–1000)

## 2019-11-25 LAB — GLUCOSE, CAPILLARY
Glucose-Capillary: 192 mg/dL — ABNORMAL HIGH (ref 70–99)
Glucose-Capillary: 153 mg/dL — ABNORMAL HIGH (ref 70–99)
Glucose-Capillary: 211 mg/dL — ABNORMAL HIGH (ref 70–99)
Glucose-Capillary: 193 mg/dL — ABNORMAL HIGH (ref 70–99)
Glucose-Capillary: 193 mg/dL — ABNORMAL HIGH (ref 70–99)

## 2019-11-25 LAB — MAGNESIUM: Magnesium: 4.4 mg/dL — ABNORMAL HIGH (ref 1.7–2.4)

## 2019-11-25 LAB — FERRITIN: Ferritin: 1853 ng/mL — ABNORMAL HIGH (ref 24–336)

## 2019-11-25 LAB — LACTATE DEHYDROGENASE, PLEURAL OR PERITONEAL FLUID: LD, Fluid: 2447 U/L — ABNORMAL HIGH (ref 3–23)

## 2019-11-25 LAB — PHOSPHORUS: Phosphorus: 5.7 mg/dL — ABNORMAL HIGH (ref 2.5–4.6)

## 2019-11-25 LAB — PROTEIN, TOTAL: Total Protein: 7.1 g/dL (ref 6.5–8.1)

## 2019-11-25 LAB — LACTATE DEHYDROGENASE: LDH: 350 U/L — ABNORMAL HIGH (ref 98–192)

## 2019-11-25 LAB — APTT: aPTT: 32 seconds (ref 24–36)

## 2019-11-25 LAB — GLUCOSE, PLEURAL OR PERITONEAL FLUID: Glucose, Fluid: <20 mg/dL

## 2019-11-25 LAB — PROTEIN, PLEURAL OR PERITONEAL FLUID: Total protein, fluid: 5.2 g/dL

## 2019-11-25 LAB — D-DIMER, QUANTITATIVE: D-Dimer, Quant: 1.32 ug/mL-FEU — ABNORMAL HIGH (ref 0.00–0.50)

## 2019-11-25 MED ORDER — HEPARIN (PORCINE) 25000 UT/250ML-% IV SOLN: 1400.0000 [IU]/h | INTRAVENOUS | Status: AC

## 2019-11-25 MED ORDER — SODIUM CHLORIDE 0.9 % IV SOLN: INTRAVENOUS | Status: DC

## 2019-11-25 MED ORDER — SODIUM CHLORIDE 0.9 % IV SOLN
2.0000 g | Freq: Three times a day (TID) | INTRAVENOUS | Status: DC
  Administered 2019-11-25 – 2019-11-27 (×6): 2 g via INTRAVENOUS
  Filled 2019-11-25 (×5): qty 2

## 2019-11-25 MED ORDER — METRONIDAZOLE IN NACL 5-0.79 MG/ML-% IV SOLN
500.0000 mg | Freq: Three times a day (TID) | INTRAVENOUS | Status: DC
  Administered 2019-11-25 – 2019-11-27 (×5): 500 mg via INTRAVENOUS
  Filled 2019-11-25 (×7): qty 100

## 2019-11-25 MED ORDER — STARCH (THICKENING) PO POWD
ORAL | Status: DC | PRN
  Filled 2019-11-25 (×2): qty 227

## 2019-11-25 MED ORDER — LINEZOLID 600 MG/300ML IV SOLN
600.0000 mg | Freq: Two times a day (BID) | INTRAVENOUS | Status: DC
  Administered 2019-11-25 – 2019-11-26 (×3): 600 mg via INTRAVENOUS
  Filled 2019-11-25 (×5): qty 300

## 2019-11-25 NOTE — Progress Notes (Addendum)
ANTICOAGULATION CONSULT NOTE - Initial Consult  Pharmacy Consult for Heparin while Eliquis on hold Indication: pulmonary embolus  No Known Allergies  Patient Measurements: Height: 5\' 5"  (165.1 cm) Weight: 208 lb 15.9 oz (94.8 kg) IBW/kg (Calculated) : 61.5 Heparin Dosing Weight: 85 kg  Vital Signs: Temp: 98.4 F (36.9 C) (01/12 1626) Temp Source: Axillary (01/12 1626) BP: 124/86 (01/12 1626) Pulse Rate: 96 (01/12 1626)  Labs: Recent Labs    11/23/19 0429 11/24/19 0555 11/25/19 0407 11/25/19 1240  HGB 14.3 13.4  --  12.3*  HCT 43.8 40.1  --  37.6*  PLT 217 185  --  208  LABPROT 19.8* 23.7* 16.5*  --   INR 1.7* 2.1* 1.3*  --   CREATININE 1.28* 1.31*  --  1.42*    Estimated Creatinine Clearance: 64.4 mL/min (A) (by C-G formula based on SCr of 1.42 mg/dL (H)).   Medical History: Past Medical History:  Diagnosis Date  . Diabetes mellitus without complication (HCC)   . Heel pain 06/09/2015  . High triglycerides 05/09/2016  . Hypertension   . Low HDL (under 40) 05/09/2016  . Obesity, Class II, BMI 35-39.9, with comorbidity (HCC) 01/04/2016    Medications:  Scheduled:  . vitamin C  500 mg Oral Daily  . bisacodyl  5 mg Oral Daily  . docusate sodium  100 mg Oral BID  . insulin aspart  0-20 Units Subcutaneous TID WC  . insulin detemir  14 Units Subcutaneous BID  . methylPREDNISolone (SOLU-MEDROL) injection  60 mg Intravenous Daily  . metoprolol tartrate  25 mg Oral BID  . polyethylene glycol  17 g Oral Daily  . sodium chloride flush  3 mL Intravenous Q12H  . zinc sulfate  220 mg Oral Daily   Infusions:  . sodium chloride    . ceFEPime (MAXIPIME) IV 2 g (11/25/19 1720)  . linezolid (ZYVOX) IV    . metronidazole     PRN: sodium chloride, acetaminophen, albuterol, chlorpheniramine-HYDROcodone, food thickener, guaiFENesin-dextromethorphan, influenza vac split quadrivalent PF, nitroGLYCERIN, ondansetron **OR** ondansetron (ZOFRAN) IV, sodium chloride, sodium chloride  flush  Assessment: 54 yo male with COVID pneumonitis started on Eliquis for PE, held yesterday for thoracentesis today for evaluation of pleural effusion.  To start IV heparin today with plans to resume Eliquis in 24-48hr if no bleeding.  Most recent dose of Eliquis 5mg  on 1/11 at 08:14  Goal of Therapy:  Heparin level 0.3-0.5, aPTT 66-84 seconds (keep at low end of goal per MD)  Monitor platelets by anticoagulation protocol: Yes   Plan:  No heparin bolus Heparin IV infusion 1400 units/hr Check heparin level and aPTT 8hrs after starting infusion Daily heparin level/aPTT and CBC Monitor for signs/symptoms of bleeding  , PharmD, BCPS Pharmacy: [phone] 11/25/2019,5:26 PM

## 2019-11-25 NOTE — Progress Notes (Signed)
PC to mobile x-ray, to discuss the CT that was ordered at 1742 this pm. As per Mardella Layman (in radiology), she states the test will be done in the am, as it is not stat, it is scheuduled routine, and she states this was decided on earlier this evening. This RN will relate this info to Dr Dwana Curd so as he can pass the above information to next shift as well.

## 2019-11-25 NOTE — Consult Note (Signed)
NAME:  Nathaniel Webb, MRN:  [id number], DOB:  01/13/1966, LOS: 14 ADMISSION DATE:  11/11/2019, CONSULTATION DATE:  11/25/19 REFERRING MD:  Dr. Joseph Art, CHIEF COMPLAINT:  Pleural Effusion   Brief History   54 y/o M admitted 12/29 to [facility] with 1 week hx of worsening shortness of breath. Found to have acute hypoxemic respiratory in the setting of COVID pneumonitis.   History of present illness   54 y/o M who presented to [facility] on 12/29 with a one week history of worsening SOB.  He has a hx of DM II, HTN, morbid obesity.  The patient was initially seen with URI symptoms on 12/3 and was diagnosed with COVID and discharged home.  He returned 12/29 with 1 week history of worsening shortness of breath.  On admit required HFNC.  He was treated with Remdesevir, Actemra, decadron, oxygen and proning.    On CTA chest 12/29 review he had diffuse bilateral airspace disease.  He had a spike in WBC on 1/10 and primary service performed a CT abdomen/pelvis which showed extensive airspace disease in the lower lungs compatible with PNA and small to moderate loculated right pleural effusion. Subsequent dedicated CT chest on 1/10 showed concern for multiloculated right hydropneumothorax. Anticoagulation was held and thoracentesis performed on 1/12 with pleural fluid concerning for empyema.  Simultaneously, his blood cultures were noted to have MSSA.    PCCM consulted for evaluation.   Past Medical History  DM II  HTN Morbid Obesity  Significant Hospital Events   12/29 Admit  1/12 PCCM consulted  Consults:  PCCM   Procedures:    Significant Diagnostic Tests:  1/12 Thora >> glucose <20, LD 2,447, protein 5.2, total nucleated cells 117,299, neutrophil 85  Micro Data:  BCID 1/10 >> staph detected, MSSA BCx2 1/10 >> staph aureus >>  Pleural fluid culture 1/12 >>   Antimicrobials:  Cefepime 1/10 >> Linezolid 1/10 >>   Interim history/subjective:  As above   Objective   Blood pressure 120/90, pulse  91, temperature 98.8 F (37.1 C), temperature source Oral, resp. rate (!) 22, height 5\' 5"  (1.651 m), weight 94.8 kg, SpO2 97 %.        Intake/Output Summary (Last 24 hours) at 11/25/2019 1534 Last data filed at 11/25/2019 0950 Gross per 24 hour  Intake 650 ml  Output 600 ml  Net 50 ml   Filed Weights   11/11/19 1500 11/23/19 0421 11/25/19 0500  Weight: 106.1 kg 95.5 kg 94.8 kg    Examination: General: adult male sitting up in bed in NAD HEENT: MM pink/moist, no jvd, fair dentition, HFNC + NRB in place Neuro: AAOx4, speech clear, MAE, normal strength CV: s1s2 RRR, no m/r/g PULM: mild work of breathing but no distress, diminished on right, coarse on left GI: soft, bsx4 active  Extremities: warm/dry, no edema  Skin: no rashes or lesions   Resolved Hospital Problem list      Assessment & Plan:   Right Empyema / Presumed MSSA PNA  Hydropneumothorax COVID Pneumonitis  CT chest with densely consolidated right lung, air fluid level / hydropneumothorax. Pleural fluid sampled and concerning for empyema.   -repeat CT now post thoracentesis  -may need TCTS surgical evaluation pending review of above -follow pleural cultures -continue abx -ID consult noted, appreciate input   MSSA Bacteremia  -per primary / ID  Best practice:  Diet: As tolerated  Pain/Anxiety/Delirium protocol (if indicated): n/a VAP protocol (if indicated): n/a DVT prophylaxis: per primary  GI prophylaxis: n/a  Glucose control: per primary  Mobility: as tolerated  Code Status: Full Code  Family Communication: per primary  Disposition: per primary   Labs   CBC: Recent Labs  Lab 11/21/19 0105 11/21/19 1846 11/22/19 0408 11/23/19 0429 11/24/19 0555 11/25/19 1240  WBC 25.9*  --  49.2* 46.2* 35.8* 26.6*  NEUTROABS 22.0*  --  43.9* 40.8* 30.5* 23.2*  HGB 14.6 17.0 15.1 14.3 13.4 12.3*  HCT 46.3 50.0 46.4 43.8 40.1 37.6*  MCV 81.9  --  81.4 80.1 79.2* 79.3*  PLT 220  --  220 217 185 208    Basic  Metabolic Panel: Recent Labs  Lab 11/21/19 0105 11/21/19 1846 11/22/19 0408 11/23/19 0429 11/24/19 0555 11/25/19 0407 11/25/19 1240  NA 133* 127* 132* 128* 130*  --  138  K 4.4 4.7 4.9 5.3* 5.2*  --  4.1  CL 94*  --  91* 87* 92*  --  96*  CO2 21*  --  27 27 24   --  28  GLUCOSE 142*  --  169* 179* 189*  --  205*  BUN 23*  --  33* 50* 63*  --  62*  CREATININE 0.80  --  1.17 1.28* 1.31*  --  1.42*  CALCIUM 8.6*  --  8.4* 8.3* 8.2*  --  7.9*  MG 2.5*  --  3.5* 3.5* 4.2* 4.4*  --   PHOS 3.4  --  4.4 4.7* 5.0* 5.7*  --    GFR: Estimated Creatinine Clearance: 64.4 mL/min (A) (by C-G formula based on SCr of 1.42 mg/dL (H)). Recent Labs  Lab 11/18/19 1611 11/22/19 0408 11/23/19 0429 11/24/19 0555 11/25/19 1240  PROCALCITON <0.10  --  4.13 3.68  --   WBC 21.6* 49.2* 46.2* 35.8* 26.6*    Liver Function Tests: Recent Labs  Lab 11/21/19 0105 11/22/19 0408 11/23/19 0429 11/24/19 0555 11/25/19 1240  AST 36 43* 29 36 36  ALT 82* 106* 68* 76* 88*  ALKPHOS 93 115 133* 116 122  BILITOT 1.0 0.8 1.0 1.0 0.8  PROT 7.2 7.0 6.9 6.8 7.1  ALBUMIN 3.7 3.5 3.0* 2.8* 2.5*   No results for input(s): LIPASE, AMYLASE in the last 168 hours. No results for input(s): AMMONIA in the last 168 hours.  ABG    Component Value Date/Time   PHART 7.467 (H) 11/21/2019 1846   PCO2ART 32.0 11/21/2019 1846   PO2ART 128.0 (H) 11/21/2019 1846   HCO3 23.1 11/21/2019 1846   TCO2 24 11/21/2019 1846   ACIDBASEDEF 4.1 (H) 11/09/2019 2210   O2SAT 99.0 11/21/2019 1846     Coagulation Profile: Recent Labs  Lab 11/21/19 0105 11/22/19 0408 11/23/19 0429 11/24/19 0555 11/25/19 0407  INR 2.5* 2.3* 1.7* 2.1* 1.3*    Cardiac Enzymes: No results for input(s): CKTOTAL, CKMB, CKMBINDEX, TROPONINI in the last 168 hours.  HbA1C: Hgb A1c MFr Bld  Date/Time Value Ref Range Status  11/10/2019 07:55 AM 7.3 (H) 4.8 - 5.6 % Final    Comment:    (NOTE) Pre diabetes:          5.7%-6.4% Diabetes:               >6.4% Glycemic control for   <7.0% adults with diabetes   06/11/2019 11:17 AM 6.4 (H) 4.8 - 5.6 % Final    Comment:             Prediabetes: 5.7 - 6.4          Diabetes: >6.4  Glycemic control for adults with diabetes: <7.0     CBG: Recent Labs  Lab 11/24/19 1718 11/24/19 2056 11/24/19 2258 11/25/19 0715 11/25/19 1159  GLUCAP 316* 216* 192* 153* 211*    Review of Systems:  Positives in [place]   Gen: Denies fever, chills, weight change, fatigue, night sweats HEENT: Denies blurred vision, double vision, hearing loss, tinnitus, sinus congestion, rhinorrhea, sore throat, neck stiffness, dysphagia PULM: Denies shortness of breath, cough, sputum production, hemoptysis, wheezing CV: Denies chest pain, edema, orthopnea, paroxysmal nocturnal dyspnea, palpitations GI: Denies abdominal pain, nausea, vomiting, diarrhea, hematochezia, melena, constipation, change in bowel habits GU: Denies dysuria, hematuria, polyuria, oliguria, urethral discharge Endocrine: Denies hot or cold intolerance, polyuria, polyphagia or appetite change Derm: Denies rash, dry skin, scaling or peeling skin change Heme: Denies easy bruising, bleeding, bleeding gums Neuro: Denies headache, numbness, weakness, slurred speech, loss of memory or consciousness   Past Medical History  He,  has a past medical history of Diabetes mellitus without complication (HCC), Heel pain (06/09/2015), High triglycerides (05/09/2016), Hypertension, Low HDL (under 40) (05/09/2016), and Obesity, Class II, BMI 35-39.9, with comorbidity (HCC) (01/04/2016).   Surgical History   History reviewed. No pertinent surgical history.   Social History   reports that he has quit smoking. He has never used smokeless tobacco. He reports that he does not drink alcohol or use drugs.   Family History   His family history includes COPD in his mother; Diabetes in his father.   Allergies No Known Allergies   Home Medications  Prior to Admission  medications   Medication Sig Start Date End Date Taking? Authorizing Provider  acetaminophen (TYLENOL) 325 MG tablet Take 2 tablets (650 mg total) by mouth every 6 (six) hours as needed for mild pain or headache (fever >/= 101). 11/10/19  Yes Eugenie Norrie, NP  aspirin EC 81 MG tablet Take 81 mg by mouth daily.   Yes [provider]  atorvastatin (LIPITOR) 10 MG tablet Take 10 mg by mouth every evening.   Yes [provider]  lisinopril (ZESTRIL) 20 MG tablet Take 20 mg by mouth daily.   Yes [provider]  metFORMIN (GLUCOPHAGE) 500 MG tablet Take 500 mg by mouth daily with breakfast.   Yes [provider]  Omega-3 Fatty Acids (FISH OIL) 1000 MG CAPS Take 1,000 mg by mouth daily.   Yes [provider]  ascorbic acid (VITAMIN C) 500 MG tablet Take 1 tablet (500 mg total) by mouth daily. 11/11/19   Eugenie Norrie, NP  azithromycin 500 mg in sodium chloride 0.9 % 250 mL Inject 500 mg into the vein daily. 11/10/19   Eugenie Norrie, NP  cefTRIAXone 1 g in sodium chloride 0.9 % 100 mL Inject 1 g into the vein daily. 11/10/19   Eugenie Norrie, NP  guaiFENesin-dextromethorphan (ROBITUSSIN DM) 100-10 MG/5ML syrup Take 10 mLs by mouth every 4 (four) hours as needed for cough. 11/10/19   Eugenie Norrie, NP  heparin 25000-0.45 UT/250ML-% infusion Inject 1,400 Units/hr into the vein continuous. 11/10/19   Eugenie Norrie, NP  insulin aspart (NOVOLOG) 100 UNIT/ML injection Inject 0-9 Units into the skin every 4 (four) hours. 11/10/19   Eugenie Norrie, NP  ondansetron (ZOFRAN) 4 MG tablet Take 1 tablet (4 mg total) by mouth every 6 (six) hours as needed for nausea. 11/10/19   Eugenie Norrie, NP  ondansetron (ZOFRAN) 4 MG/2ML SOLN injection Inject 2 mLs (4 mg total) into the vein  every 6 (six) hours as needed for nausea. 11/10/19   Awilda Bill, NP  sodium chloride flush (NS) 0.9 % SOLN Inject 3 mLs into the vein every 12 (twelve) hours. 11/10/19    Awilda Bill, NP  zinc sulfate 220 (50 Zn) MG capsule Take 1 capsule (220 mg total) by mouth daily. 11/11/19   Awilda Bill, NP     Critical care time: n/a    Noe Gens, MSN, NP-C [facility] 11/25/2019, 5:52 PM   Please see [email or web address] for pager details.

## 2019-11-25 NOTE — Progress Notes (Addendum)
PROGRESS NOTE    Nathaniel Webb  TDV:[id number] DOB: [birth year] DOA: 11/11/2019 PCP: Nathaniel Gala, NP   Brief Narrative:  54 year old BM PMHx diabetes type 2 uncontrolled with complication/diabetic neuropathy,, HTN, HLD, obesity  Presented to [facility] with approximately 1 week history of gradually worsening shortness of breath.  He was found to have acute hypoxic respiratory failure and was requiring high flow oxygen to maintain O2 saturations.  Chest x-ray was typical of Covid pneumonitis.    Patient stated had a URI symptoms, weakness that started before his shortness of breath.  He presented to the ED on 12/23 and was diagnosed with COVID-19-but was discharged home.    Subjective: 1/12 afebrile last 24 hours positive S OB, positive diarrhea (post Kayexalate)   Assessment & Plan:   Principal Problem:   Acute respiratory failure with hypoxemia (HCC) Active Problems:   Type 2 diabetes, controlled, with peripheral neuropathy (HCC)   Hypertension   Morbid obesity (HCC)   Pneumonia due to COVID-19 virus   Diabetes mellitus type 2, uncontrolled, with complications (HCC)   Acute pulmonary embolus (HCC)   Demand ischemia (HCC)   Constipation   Loculated pleural effusion   Covid pneumonia/acute respiratory failure with hypoxia  COVID-19 Labs  Recent Labs    11/23/19 0429 11/24/19 0555 11/25/19 0407  DDIMER 0.86* 0.88* 1.32*  FERRITIN 1,244* 1,298* 1,853*  CRP 21.3* 19.4* 23.3*    No results found for: SARSCOV2NAA 12/23 POC SARS coronavirus positive  -1/9 restart Solu-Medrol 60 mg daily -Remdesivir per pharmacy protocol -Actemra received at [facility] -Combivent -Flutter valve -Incentive spirometry -Titrate O2 to maintain SPO2> 88% -Prone patient for 16 hours/day; if patient cannot tolerate prone 2 to 3 hours per shift -PRN Lasix -1/9 Lasix IV 60 mg x 1 -1/10 procalcitonin elevated therefore not eligible for Actemra   ARDS/HCAP/Aspiration? -Patient's PCXR  fits criteria for developing ARDS bilateral increasing opacification however, respiratory status worsened today, However this was during the time Pt was having CP   -ABG essentially normal -Trend procalcitonin Results for Nathaniel Webb (MRN [id number]) as of 11/23/2019 12:43  Ref. Range 11/14/2019 01:30 11/14/2019 15:15 11/18/2019 16:11 11/23/2019 04:29  Procalcitonin Latest Units: ng/mL 0.33 0.33 <0.10 4.13  -1/10 cefepime x7 days.+ Metronidazole + linezolid.  Loculated pleural effusion -1/10 panculture -1/10 CT abdomen/pelvis; moderate stool burden, loculated RIGHT pleural effusion see results below -1/10 n.p.o. until swallow evaluation  Loculated RIGHT pleural effusion (empyema) -New not present on CT angio chest PE protocol 12/29 -1/11 CTS consulted Dr. Donata Webb believes there is effusion but more collapsed lung, feels more appropriate for chest tube. -1/11 discussed case be Nathaniel Webb/Dr. Vassie Webb PCCM will place chest tube in a.m. DC Eliquis  -1/12 PCCM performed thoracentesis aspirated  serosanguineous fluid.  Decided against placement of chest tube. -1/12 continue current antibiotics until cultures result  MSSA bacteremia  -1/12 should be covered by current triple coverage for loculated pleural effusion.  Discussed with pharmacy. -1/12 would consult ID in the a.m. ensure there automated system has notified him  Acute PE -CTA chest PE protocol positive for PE see results below -Bilateral lower extremity Dopplers negative for DVT -1/8 Eliquis per pharmacy; 1/11 (hold) chest tube placement in a.m. -1/12 restart heparin maintain low side of therapeutic given patient's bloody thoracentesis.  Could resume Eliquis in another 24 to 48 hours  Chronic diastolic CHF/LVH -Strict in and out -5.2 L -Daily weight Filed Weights   11/11/19 1500 11/23/19 0421 11/25/19 0500  Weight: 106.1 kg  95.5 kg 94.8 kg  -DC metoprolol IV -1/10 increase Metoprolol 25 mg  BID  Essential HTN -Currently  controlled  Chest pain -1/8 substernal radiates 8/10 -NTG x1-->6/10, repeat NTG (hold on morphine unless absolutely necessary secondary to patient's constipation) -Results for Nathaniel Webb (MRN [id number]) as of 11/22/2019 09:02  Ref. Range 11/21/2019 14:00 11/21/2019 15:35  Troponin I (High Sensitivity) Latest Ref Range: <18 ng/L 9 11   -1/8 EKG; nonspecific ST-T wave changes, LVH -1/8 PCXR; worsening opacification see results below.  See ARDS -Echocardiogram; consistent with diastolic CHF see results below -1/9 PCXR per radiologist no substantial change however I believe left lung field slightly improved.  Acute renal failure -Patient currently not on any diuretic.  Secondary to Covid infection vs bacteremia vs hydration. Recent Labs  Lab 11/21/19 0105 11/22/19 0408 11/23/19 0429 11/24/19 0555 11/25/19 1240  CREATININE 0.80 1.17 1.28* 1.31* 1.42*  -Mildly hydrate normal saline 102ml/hr  Diabetes type 2 uncontrolled with complication -12/28 hemoglobin A1c= 7.3 -1/11 Levemir 14 units BID -Resistant SSI  Mild transaminitis -Most likely secondary to Covid, remdesivir, and Actemra monitor closely.    Minimally elevated troponin level: Demand ischemia -Downtrending-EKG reassuring-likely demand ischemia.  Doubt any further work-up is required.  Hyperkalemia -Resolved  Hyponatremia -Mild asymptomatic will currently monitor  Constipation -Colace -1/9 MiraLAX daily -1/9 Bisacodyl 5mg  daily  -1/8 KUB; negative SBO/ileus.   -Magnesium citrate 1/2 bottle  Goals of care -On 1/2 requested that LCSW Nathaniel Webb; start on Eliquis may obtain free medication medication MGMT     DVT prophylaxis: Full dose Lovenox--> Eliquis--> heparin drip Code Status: Full Family Communication: 11/25/2019 spoke with 01/23/2020 (wife) counseled her on plan of care answered all questions Disposition Plan:    Consultants:  Phone consult cardiothoracic surgery PCCM    Procedures/Significant  Events:  Actemra received at [facility] 12/29 CTA chest PE protocol; positive PE small volume of somewhat eccentric appearing embolus present in the distal right pulmonary artery, right-sided lobar lobar, and some proximal segmental branches. No left-sided embolus noted. -Extensive ground-glass and heterogeneous airspace opacity throughout the lungs bilaterally, most conspicuous in the dependent left lung. -CAD- Hepatic steatosis. 12/29 bilateral lower extremity Doppler negative for DVT 1/1 PCXR; Diffuse bilateral airspace opacities consistent with the given clinical history of COVID-19 positivity. Stable  1/8 PCXR;-interval worsening right lung opacities with persistent left mid and lower lung opacities. 1/8 KUB;-stool noted throughout the colon. No bowel distention or free air. -Bibasilar atelectasis/infiltrates again noted. Right pleural effusion cannot be excluded 1/9 PCXR;-no substantial change in exam.  -Diffuse airspace disease in the right lung and left mid and lower lung involvement, compatible with multifocal pneumonia.  (To me left lung looks slightly improved) 1/9 Echocardiogram;Left Ventricle: EF 70 to 75%. - moderately increased left ventricular hypertrophy.  - Grade I diastolic dysfunction (impaired relaxation). Normal left atrial pressure. Right Ventricle: Tricuspid regurgitant velocity is 3.11 m/s, and with an assumed right atrial pressure of 10 mmHg, the estimated right ventricular  systolic pressure is moderately elevated at 48.6 mmHg. 1/10 CT abdomen/pelvis W0 contrast;-moderate stool burden throughout the colon. -Aiirspace disease in both lower lobes, right greater than left compatible with pneumonia.  -Loculated right pleural effusion. -Rim calcified structure in the right lower quadrant this may be related to fat necrosis or old omental infarct. 1/10 CT chest W. Wo contrast;-multiloculated right lung hydropneumothorax, with a somewhat larger volume of pleural fluid relative to  pleural air. -progressed multilobar right lung consolidation since the Chest CT on 11/11/2019. -  Small volume loculated right side pneumomediastinum is new since December. -. Regressed but not resolved left lung opacity since December due to COVID pneumonia. 11/12 PCCM performed thoracentesis aspirated 200 ml  serosanguineous fluid    I have personally reviewed and interpreted all radiology studies and my findings are as above.  VENTILATOR SETTINGS: HFNC+ NRB 1/12 Flow; 15 L/min SPO2 97%    Cultures 12/23 POC SARS coronavirus positive 12/28 HIV screen negative 1/10 urine pending 1/10 blood LEFT arm MSSA positive 1/10 blood LEFT hand MSSA positive 1/10 sputum pending 1/12 RIGHT thoracentesis pending    Antimicrobials: Anti-infectives (From admission, onward)   Start     Dose/Rate Stop   11/24/19 2300  ceFAZolin (ANCEF) IVPB 2g/100 mL premix     2 g 200 mL/hr over 30 Minutes     11/23/19 1800  metroNIDAZOLE (FLAGYL) IVPB 500 mg  Status:  Discontinued     500 mg 100 mL/hr over 60 Minutes 11/24/19 2215   11/23/19 1800  linezolid (ZYVOX) IVPB 600 mg  Status:  Discontinued     600 mg 300 mL/hr over 60 Minutes 11/24/19 2215   11/23/19 1400  ceFEPIme (MAXIPIME) 2 g in sodium chloride 0.9 % 100 mL IVPB  Status:  Discontinued     2 g 200 mL/hr over 30 Minutes 11/24/19 2215   11/12/19 1000  remdesivir 100 mg in sodium chloride 0.9 % 100 mL IVPB  Status:  Discontinued     100 mg 200 mL/hr over 30 Minutes 11/11/19 1238   11/12/19 1000  remdesivir 100 mg in sodium chloride 0.9 % 100 mL IVPB     100 mg 200 mL/hr over 30 Minutes 11/14/19 0857   11/11/19 1330  cefTRIAXone (ROCEPHIN) 1 g in sodium chloride 0.9 % 100 mL IVPB     1 g 200 mL/hr over 30 Minutes 11/15/19 1334   11/11/19 1330  azithromycin (ZITHROMAX) 500 mg in sodium chloride 0.9 % 250 mL IVPB     500 mg 250 mL/hr over 60 Minutes 11/13/19 1623   11/11/19 1230  remdesivir 200 mg in sodium chloride 0.9% 250 mL IVPB   Status:  Discontinued     200 mg 580 mL/hr over 30 Minutes 11/11/19 1238       Devices    LINES / TUBES:      Continuous Infusions: . sodium chloride    .  ceFAZolin (ANCEF) IV Stopped (11/25/19 0636)     Objective: Vitals:   11/25/19 0400 11/25/19 0500 11/25/19 0717 11/25/19 0952  BP: 117/89  127/82   Pulse: 97  97 (!) 111  Resp: (!) 22  20   Temp: 98.2 F (36.8 C)  98.8 F (37.1 C)   TempSrc: Oral  Oral   SpO2: 98%  97%   Weight:  94.8 kg    Height:        Intake/Output Summary (Last 24 hours) at 11/25/2019 1022 Last data filed at 11/25/2019 0736 Gross per 24 hour  Intake 526.19 ml  Output 600 ml  Net -73.81 ml   Filed Weights   11/11/19 1500 11/23/19 0421 11/25/19 0500  Weight: 106.1 kg 95.5 kg 94.8 kg   Physical Exam:  General: A/O x4, positive acute respiratory distress Eyes: negative scleral hemorrhage, negative anisocoria, negative icterus ENT: Negative Runny nose, negative gingival bleeding, Neck:  Negative scars, masses, torticollis, lymphadenopathy, JVD Lungs: Tachypneic, absent breath sounds RIGHT lung fields, decreased breath sounds LEFT lung fields, without wheezes or crackles Cardiovascular: Regular rate and rhythm  without murmur gallop or rub normal S1 and S2 Abdomen: negative abdominal pain, nondistended, positive soft, bowel sounds, no rebound, no ascites, no appreciable mass Extremities: No significant cyanosis, clubbing, or edema bilateral lower extremities Skin: Negative rashes, lesions, ulcers Psychiatric:  Negative depression, negative anxiety, negative fatigue, negative mania  Central nervous system:  Cranial nerves II through XII intact, tongue/uvula midline, all extremities muscle strength 5/5, sensation intact throughout, negative dysarthria, negative expressive aphasia, negative receptive aphasia.         .     Data Reviewed: Care during the described time interval was provided by me .  I have reviewed this patient's  available data, including medical history, events of note, physical examination, and all test results as part of my evaluation.   CBC: Recent Labs  Lab 11/20/19 0050 11/21/19 0105 11/21/19 1846 11/22/19 0408 11/23/19 0429 11/24/19 0555  WBC 27.2* 25.9*  --  49.2* 46.2* 35.8*  NEUTROABS 22.2* 22.0*  --  43.9* 40.8* 30.5*  HGB 15.1 14.6 17.0 15.1 14.3 13.4  HCT 47.4 46.3 50.0 46.4 43.8 40.1  MCV 81.9 81.9  --  81.4 80.1 79.2*  PLT 258 220  --  220 217 301   Basic Metabolic Panel: Recent Labs  Lab 11/20/19 0050 11/21/19 0105 11/21/19 1846 11/22/19 0408 11/23/19 0429 11/24/19 0555 11/25/19 0407  NA 133* 133* 127* 132* 128* 130*  --   K 4.3 4.4 4.7 4.9 5.3* 5.2*  --   CL 97* 94*  --  91* 87* 92*  --   CO2 24 21*  --  27 27 24   --   GLUCOSE 146* 142*  --  169* 179* 189*  --   BUN 26* 23*  --  33* 50* 63*  --   CREATININE 0.99 0.80  --  1.17 1.28* 1.31*  --   CALCIUM 8.7* 8.6*  --  8.4* 8.3* 8.2*  --   MG 2.6* 2.5*  --  3.5* 3.5* 4.2* 4.4*  PHOS 3.2 3.4  --  4.4 4.7* 5.0* 5.7*   GFR: Estimated Creatinine Clearance: 69.8 mL/min (A) (by C-G formula based on SCr of 1.31 mg/dL (H)). Liver Function Tests: Recent Labs  Lab 11/20/19 0050 11/21/19 0105 11/22/19 0408 11/23/19 0429 11/24/19 0555  AST 40 36 43* 29 36  ALT 84* 82* 106* 68* 76*  ALKPHOS 101 93 115 133* 116  BILITOT 1.0 1.0 0.8 1.0 1.0  PROT 7.6 7.2 7.0 6.9 6.8  ALBUMIN 3.9 3.7 3.5 3.0* 2.8*   No results for input(s): LIPASE, AMYLASE in the last 168 hours. No results for input(s): AMMONIA in the last 168 hours. Coagulation Profile: Recent Labs  Lab 11/21/19 0105 11/22/19 0408 11/23/19 0429 11/24/19 0555 11/25/19 0407  INR 2.5* 2.3* 1.7* 2.1* 1.3*   Cardiac Enzymes: No results for input(s): CKTOTAL, CKMB, CKMBINDEX, TROPONINI in the last 168 hours. BNP (last 3 results) No results for input(s): PROBNP in the last 8760 hours. HbA1C: No results for input(s): HGBA1C in the last 72 hours. CBG: Recent  Labs  Lab 11/24/19 1156 11/24/19 1718 11/24/19 2056 11/24/19 2258 11/25/19 0715  GLUCAP 320* 316* 216* 192* 153*   Lipid Profile: No results for input(s): CHOL, HDL, LDLCALC, TRIG, CHOLHDL, LDLDIRECT in the last 72 hours. Thyroid Function Tests: No results for input(s): TSH, T4TOTAL, FREET4, T3FREE, THYROIDAB in the last 72 hours. Anemia Panel: Recent Labs    11/24/19 0555 11/25/19 0407  FERRITIN 1,298* 1,853*   Urine analysis:    Component Value  Date/Time   COLORURINE YELLOW (A) 07/08/2018 1113   APPEARANCEUR CLEAR (A) 07/08/2018 1113   LABSPEC 1.013 07/08/2018 1113   PHURINE 5.0 07/08/2018 1113   GLUCOSEU NEGATIVE 07/08/2018 1113   HGBUR NEGATIVE 07/08/2018 1113   BILIRUBINUR NEGATIVE 07/08/2018 1113   KETONESUR NEGATIVE 07/08/2018 1113   PROTEINUR NEGATIVE 07/08/2018 1113   NITRITE NEGATIVE 07/08/2018 1113   LEUKOCYTESUR NEGATIVE 07/08/2018 1113   Sepsis Labs: @LABRCNTIP (procalcitonin:4,lacticidven:4)  ) Recent Results (from the past 240 hour(s))  Culture, blood (routine x 2)     Status: Abnormal (Preliminary result)   Collection Time: 11/23/19  2:10 PM   Specimen: BLOOD  Result Value Ref Range Status   Specimen Description   Final    BLOOD LEFT ARM Performed at [facility], [address] [place], [place] [address]    Special Requests   Final    BOTTLES DRAWN AEROBIC ONLY Blood Culture adequate volume Performed at [facility], [address] [place], [place] [address]    Culture  Setup Time   Final    AEROBIC BOTTLE ONLY GRAM POSITIVE COCCI IN CLUSTERS CRITICAL RESULT CALLED TO, READ BACK BY AND VERIFIED WITH: E. WILLIAMSON, PHARMD (GVC) AT 1255 ON 11/24/19 BY C. JESSUP, MT. Performed at [facility], [address] [place], [place] [address]    Culture STAPHYLOCOCCUS AUREUS (A)  Final   Report Status PENDING  Incomplete  Culture, blood (routine x 2)     Status: Abnormal (Preliminary result)   Collection  Time: 11/23/19  2:15 PM   Specimen: BLOOD  Result Value Ref Range Status   Specimen Description   Final    BLOOD LEFT HAND Performed at [facility], [address] [place], [place] [address]    Special Requests   Final    BOTTLES DRAWN AEROBIC ONLY Blood Culture adequate volume Performed at [facility], [address] [place], [place] [address]    Culture  Setup Time   Final    GRAM POSITIVE COCCI IN CLUSTERS AEROBIC BOTTLE ONLY CRITICAL RESULT CALLED TO, READ BACK BY AND VERIFIED WITH: K AMEND [facility] 2202 11/24/19 A BROWNING Performed at [facility], [address] [place], [place] [address]    Culture STAPHYLOCOCCUS AUREUS (A)  Final   Report Status PENDING  Incomplete  Blood Culture ID Panel (Reflexed)     Status: Abnormal   Collection Time: 11/23/19  2:15 PM  Result Value Ref Range Status   Enterococcus species NOT DETECTED NOT DETECTED Final   Listeria monocytogenes NOT DETECTED NOT DETECTED Final   Staphylococcus species DETECTED (A) NOT DETECTED Final    Comment: CRITICAL RESULT CALLED TO, READ BACK BY AND VERIFIED WITH: K AMEND PHARMD 2202 11/24/19 A BROWNING    Staphylococcus aureus (BCID) DETECTED (A) NOT DETECTED Final    Comment: Methicillin (oxacillin) susceptible Staphylococcus aureus (MSSA). Preferred therapy is anti staphylococcal beta lactam antibiotic (Cefazolin or Nafcillin), unless clinically contraindicated. CRITICAL RESULT CALLED TO, READ BACK BY AND VERIFIED WITH: K AMEND PHARMD 2202 11/24/19 A BROWNING    Methicillin resistance NOT DETECTED NOT DETECTED Final   Streptococcus species NOT DETECTED NOT DETECTED Final   Streptococcus agalactiae NOT DETECTED NOT DETECTED Final   Streptococcus pneumoniae NOT DETECTED NOT DETECTED Final   Streptococcus pyogenes NOT DETECTED NOT DETECTED Final   Acinetobacter baumannii NOT DETECTED NOT DETECTED Final   Enterobacteriaceae species NOT DETECTED NOT DETECTED Final    Enterobacter cloacae complex NOT  DETECTED NOT DETECTED Final   Escherichia coli NOT DETECTED NOT DETECTED Final   Klebsiella oxytoca NOT DETECTED NOT DETECTED Final   Klebsiella pneumoniae NOT DETECTED NOT DETECTED Final   Proteus species NOT DETECTED NOT DETECTED Final   Serratia marcescens NOT DETECTED NOT DETECTED Final   Haemophilus influenzae NOT DETECTED NOT DETECTED Final   Neisseria meningitidis NOT DETECTED NOT DETECTED Final   Pseudomonas aeruginosa NOT DETECTED NOT DETECTED Final   Candida albicans NOT DETECTED NOT DETECTED Final   Candida glabrata NOT DETECTED NOT DETECTED Final   Candida krusei NOT DETECTED NOT DETECTED Final   Candida parapsilosis NOT DETECTED NOT DETECTED Final   Candida tropicalis NOT DETECTED NOT DETECTED Final    Comment: Performed at [facility], [address], [place], [place] [address]         Radiology Studies: CT ABDOMEN PELVIS WO CONTRAST  Result Date: 11/23/2019 CLINICAL DATA:  Intra-abdominal infection/peritonitis EXAM: CT ABDOMEN AND PELVIS WITHOUT CONTRAST TECHNIQUE: Multidetector CT imaging of the abdomen and pelvis was performed following the standard protocol without IV contrast. COMPARISON:  None. FINDINGS: Lower chest: Extensive airspace disease in the lower lungs compatible with pneumonia. Small to moderate loculated right pleural effusion. Heart is normal size. Hepatobiliary: No focal hepatic abnormality. Gallbladder unremarkable. Pancreas: No focal abnormality or ductal dilatation. Spleen: No focal abnormality.  Normal size. Adrenals/Urinary Tract: No adrenal abnormality. No focal renal abnormality. No stones or hydronephrosis. Urinary bladder is unremarkable. Stomach/Bowel: Moderate stool burden in the colon. No evidence of bowel obstruction. Stomach is mildly distended with fluid. Vascular/Lymphatic: Aortic atherosclerosis. No enlarged abdominal or pelvic lymph nodes. Reproductive: No visible focal abnormality. Other: No free  fluid or free air. Rim calcified structure noted in the right lower quadrant measuring 4.4 x 3.8 cm. Musculoskeletal: No acute bony abnormality. IMPRESSION: Moderate stool burden throughout the colon. Airspace disease in both lower lobes, right greater than left compatible with pneumonia. Loculated right pleural effusion. Aortic atherosclerosis. Rim calcified structure in the right lower quadrant this may be related to fat necrosis or old omental infarct. Electronically Signed   By: Charlett Nose M.D.   On: 11/23/2019 16:00   CT CHEST WO CONTRAST  Addendum Date: 11/23/2019   ADDENDUM REPORT: 11/23/2019 21:59 ADDENDUM: Study discussed by telephone with Dr. Dwana Curd on 11/23/2019 at 2155 hours. Electronically Signed   By: Odessa Fleming M.D.   On: 11/23/2019 21:59   Result Date: 11/23/2019 CLINICAL DATA:  54 year old male COVID-19. Loculated right pleural effusion on CT Abdomen and Pelvis earlier today. EXAM: CT CHEST WITHOUT CONTRAST TECHNIQUE: Multidetector CT imaging of the chest was performed following the standard protocol without IV contrast. COMPARISON:  CT Abdomen and Pelvis 1317 hours today. Chest CT 11/11/2019. FINDINGS: Cardiovascular: Calcified coronary artery atherosclerosis. No cardiomegaly or pericardial effusion. Vascular patency is not evaluated in the absence of IV contrast. Mediastinum/Nodes: No mediastinal lymphadenopathy. Small volume of loculated pneumomediastinum is demonstrated on series 3, image 33 - new from December. Lungs/Pleura: Multiloculated right lung hydropneumothorax with a moderate to large volume of pleural fluid and small to moderate volume of pleural air. Superimposed are multifocal right lung consolidation and peribronchial ground-glass opacity in the non consolidated right lung. Central airways remain patent. Patchy widespread ground-glass opacity in the left lung has regressed since 11/11/2019. No left pleural effusion. Upper Abdomen: Stable from the earlier CT Abdomen and Pelvis.  Musculoskeletal: No acute osseous abnormality identified. IMPRESSION: 1. Multiloculated right lung hydropneumothorax, with a somewhat larger volume of pleural fluid  relative to pleural air. 2. Underlying progressed multilobar right lung consolidation since the Chest CT on 11/11/2019. 3. Small volume loculated right side pneumomediastinum is new since December. 4. Regressed but not resolved left lung opacity since December due to COVID pneumonia. Electronically Signed: By: Odessa Fleming M.D. On: 11/23/2019 21:43        Scheduled Meds: . vitamin C  500 mg Oral Daily  . bisacodyl  5 mg Oral Daily  . docusate sodium  100 mg Oral BID  . insulin aspart  0-20 Units Subcutaneous TID WC  . insulin detemir  14 Units Subcutaneous BID  . methylPREDNISolone (SOLU-MEDROL) injection  60 mg Intravenous Daily  . metoprolol tartrate  25 mg Oral BID  . polyethylene glycol  17 g Oral Daily  . sodium chloride flush  3 mL Intravenous Q12H  . zinc sulfate  220 mg Oral Daily   Continuous Infusions: . sodium chloride    .  ceFAZolin (ANCEF) IV Stopped (11/25/19 0636)     LOS: 14 days   The patient is critically ill with multiple organ systems failure and requires high complexity decision making for assessment and support, frequent evaluation and titration of therapies, application of advanced monitoring technologies and extensive interpretation of multiple databases. Critical Care Time devoted to patient care services described in this note  Time spent: 40 minutes     Denya Buckingham, Roselind Messier, MD [facility] Pager [phone]  If 7PM-7AM, please contact night-coverage [email or web address] Password [facility] 11/25/2019, 10:22 AM

## 2019-11-25 NOTE — Progress Notes (Addendum)
I was contacted by Dr. Joseph Art regarding antibiotic therapy. He is aware of BCID results but wants to resume broad spectrum therapy with cefepime, linezolid and metronidazole for loculated pleural effusion pending culture results s/p thoracentesis today.  Verbal orders to change antibiotics entered.  Loralee Pacas, PharmD, BCPS Pharmacy: [phone] 11/25/2019 5:10 PM

## 2019-11-25 NOTE — Consult Note (Addendum)
[facility]    Date of Admission:  11/11/2019   Total days of antibiotics: 2        zyvox/cefepime/flagyl               Reason for Consult: Staph bacteremia    Referring Provider: CHAMP!   Assessment: Staph bacteremia (MSSA) 2/4 PE Empyema COVID Obesity DM2, uncontrolled (A1C 7.3%) AKI  Plan: 1. Await Cx from pleural fluid 2. Consider stopping zyvox (he has MSSA) 3.  await further data from BCx 4. Repeat BCx 5. He needs TEE. His TTE did not note endocarditis.  6. Diabetic control (difficult with steroids) 7. Watch his Cr (trending up, due to diuresis?)  Will follow with you  Thank you so much for this interesting consult,  Principal Problem:   Acute respiratory failure with hypoxemia (HCC) Active Problems:   Type 2 diabetes, controlled, with peripheral neuropathy (HCC)   Hypertension   Morbid obesity (HCC)   Pneumonia due to COVID-19 virus   Diabetes mellitus type 2, uncontrolled, with complications (HCC)   Acute pulmonary embolus (HCC)   Demand ischemia (HCC)   Constipation   Loculated pleural effusion   MSSA bacteremia   . vitamin C  500 mg Oral Daily  . bisacodyl  5 mg Oral Daily  . docusate sodium  100 mg Oral BID  . insulin aspart  0-20 Units Subcutaneous TID WC  . insulin detemir  14 Units Subcutaneous BID  . methylPREDNISolone (SOLU-MEDROL) injection  60 mg Intravenous Daily  . metoprolol tartrate  25 mg Oral BID  . polyethylene glycol  17 g Oral Daily  . sodium chloride flush  3 mL Intravenous Q12H  . zinc sulfate  220 mg Oral Daily    HPI: Nathaniel Webb is a 54 y.o. male 54 yo M with hx of DM2, obesity, COVID (dx 12-23)  adm on 12-27 with fever and weakness. He was treated with decadron, remdesivir (12-29 to 1-2), actemra.  He was found on admission to have PE. He was started on lovenox.  His course has been complicated by constipation (iimproved over last 24h with lactulose). He also developed CP on 1-8  (troponinns -) and worsening respiratory status. He was pan-Cx on 1-10 and started on flagyl/cefepime/llinezolid. His CXR was notable for a loculated R pleural effusion. He underwent thoracentesis this AM. Chest tube deferred.  His BCx are now notable for MRSA.     Review of Systems: Review of Systems  Constitutional: Negative for chills and fever.  Respiratory: Positive for cough and shortness of breath.   Gastrointestinal: Positive for constipation and diarrhea.  Genitourinary: Negative for dysuria.  Neurological: Positive for sensory change.  Please see HPI. All other systems reviewed and negative.   Past Medical History:  Diagnosis Date  . Diabetes mellitus without complication (HCC)   . Heel pain 06/09/2015  . High triglycerides 05/09/2016  . Hypertension   . Low HDL (under 40) 05/09/2016  . Obesity, Class II, BMI 35-39.9, with comorbidity (HCC) 01/04/2016    Social History   Tobacco Use  . Smoking status: Former Games developermoker  . Smokeless tobacco: Never Used  Substance Use Topics  . Alcohol use: No    Alcohol/week: 0.0 standard drinks  . Drug use: No    Family History  Problem Relation Age of Onset  . Diabetes Father   . COPD Mother      Medications:  Scheduled: . vitamin C  500  mg Oral Daily  . bisacodyl  5 mg Oral Daily  . docusate sodium  100 mg Oral BID  . insulin aspart  0-20 Units Subcutaneous TID WC  . insulin detemir  14 Units Subcutaneous BID  . methylPREDNISolone (SOLU-MEDROL) injection  60 mg Intravenous Daily  . metoprolol tartrate  25 mg Oral BID  . polyethylene glycol  17 g Oral Daily  . sodium chloride flush  3 mL Intravenous Q12H  . zinc sulfate  220 mg Oral Daily    Abtx:  Anti-infectives (From admission, onward)   Start     Dose/Rate Route Frequency Ordered Stop   11/25/19 1800  ceFEPIme (MAXIPIME) 2 g in sodium chloride 0.9 % 100 mL IVPB     2 g 200 mL/hr over 30 Minutes Intravenous Every 8 hours 11/25/19 1706     11/25/19 1800  linezolid  (ZYVOX) IVPB 600 mg     600 mg 300 mL/hr over 60 Minutes Intravenous Every 12 hours 11/25/19 1707     11/25/19 1800  metroNIDAZOLE (FLAGYL) IVPB 500 mg     500 mg 100 mL/hr over 60 Minutes Intravenous Every 8 hours 11/25/19 1707     11/24/19 2300  ceFAZolin (ANCEF) IVPB 2g/100 mL premix  Status:  Discontinued     2 g 200 mL/hr over 30 Minutes Intravenous Every 8 hours 11/24/19 2215 11/25/19 1706   11/23/19 1800  metroNIDAZOLE (FLAGYL) IVPB 500 mg  Status:  Discontinued     500 mg 100 mL/hr over 60 Minutes Intravenous Every 8 hours 11/23/19 1636 11/24/19 2215   11/23/19 1800  linezolid (ZYVOX) IVPB 600 mg  Status:  Discontinued     600 mg 300 mL/hr over 60 Minutes Intravenous Every 12 hours 11/23/19 1636 11/24/19 2215   11/23/19 1400  ceFEPIme (MAXIPIME) 2 g in sodium chloride 0.9 % 100 mL IVPB  Status:  Discontinued     2 g 200 mL/hr over 30 Minutes Intravenous Every 8 hours 11/23/19 1317 11/24/19 2215   11/12/19 1000  remdesivir 100 mg in sodium chloride 0.9 % 100 mL IVPB  Status:  Discontinued     100 mg 200 mL/hr over 30 Minutes Intravenous Daily 11/11/19 1231 11/11/19 1238   11/12/19 1000  remdesivir 100 mg in sodium chloride 0.9 % 100 mL IVPB     100 mg 200 mL/hr over 30 Minutes Intravenous Daily 11/11/19 1239 11/14/19 0857   11/11/19 1330  cefTRIAXone (ROCEPHIN) 1 g in sodium chloride 0.9 % 100 mL IVPB     1 g 200 mL/hr over 30 Minutes Intravenous Every 24 hours 11/11/19 1315 11/15/19 1334   11/11/19 1330  azithromycin (ZITHROMAX) 500 mg in sodium chloride 0.9 % 250 mL IVPB     500 mg 250 mL/hr over 60 Minutes Intravenous Every 24 hours 11/11/19 1315 11/13/19 1623   11/11/19 1230  remdesivir 200 mg in sodium chloride 0.9% 250 mL IVPB  Status:  Discontinued     200 mg 580 mL/hr over 30 Minutes Intravenous Once 11/11/19 1231 11/11/19 1238        OBJECTIVE: Blood pressure 124/86, pulse 96, temperature 98.4 F (36.9 C), temperature source Axillary, resp. rate 20, height 5'  5" (1.651 m), weight 94.8 kg, SpO2 96 %.  Physical Exam Constitutional:      Appearance: He is obese. He is ill-appearing and diaphoretic.  HENT:     Mouth/Throat:     Mouth: Mucous membranes are moist.     Pharynx: No oropharyngeal exudate.  Eyes:  Extraocular Movements: Extraocular movements intact.  Cardiovascular:     Rate and Rhythm: Normal rate and regular rhythm.  Pulmonary:     Breath sounds: Rhonchi present.  Abdominal:     General: Bowel sounds are normal. There is distension.     Palpations: Abdomen is soft.     Tenderness: There is no abdominal tenderness.  Musculoskeletal:     Cervical back: Normal range of motion and neck supple.     Right lower leg: No edema.     Left lower leg: No edema.  Neurological:     Mental Status: He is alert.     Sensory: Sensory deficit present.     Lab Results Results for orders placed or performed during the hospital encounter of 11/11/19 (from the past 48 hour(s))  Glucose, capillary     Status: Abnormal   Collection Time: 11/23/19  8:02 PM  Result Value Ref Range   Glucose-Capillary 261 (H) 70 - 99 mg/dL  Glucose, capillary     Status: Abnormal   Collection Time: 11/24/19  5:15 AM  Result Value Ref Range   Glucose-Capillary 216 (H) 70 - 99 mg/dL  Magnesium     Status: Abnormal   Collection Time: 11/24/19  5:55 AM  Result Value Ref Range   Magnesium 4.2 (H) 1.7 - 2.4 mg/dL    Comment: Performed at [facility], [address], [place], [place] [address]  Phosphorus     Status: Abnormal   Collection Time: 11/24/19  5:55 AM  Result Value Ref Range   Phosphorus 5.0 (H) 2.5 - 4.6 mg/dL    Comment: Performed at [facility], [address] [place], [place] [address]  Protime-INR     Status: Abnormal   Collection Time: 11/24/19  5:55 AM  Result Value Ref Range   Prothrombin Time 23.7 (H) 11.4 - 15.2 seconds   INR 2.1 (H) 0.8 - 1.2    Comment: (NOTE) INR goal varies based on device  and disease states. Performed at [facility], [address] [place], [place] [address]   Comprehensive metabolic panel     Status: Abnormal   Collection Time: 11/24/19  5:55 AM  Result Value Ref Range   Sodium 130 (L) 135 - 145 mmol/L   Potassium 5.2 (H) 3.5 - 5.1 mmol/L   Chloride 92 (L) 98 - 111 mmol/L   CO2 24 22 - 32 mmol/L   Glucose, Bld 189 (H) 70 - 99 mg/dL   BUN 63 (H) 6 - 20 mg/dL   Creatinine, Ser 9.14 (H) 0.61 - 1.24 mg/dL   Calcium 8.2 (L) 8.9 - 10.3 mg/dL   Total Protein 6.8 6.5 - 8.1 g/dL   Albumin 2.8 (L) 3.5 - 5.0 g/dL   AST 36 15 - 41 U/L   ALT 76 (H) 0 - 44 U/L   Alkaline Phosphatase 116 38 - 126 U/L   Total Bilirubin 1.0 0.3 - 1.2 mg/dL   GFR calc non Af Amer >60 >60 mL/min   GFR calc Af Amer >60 >60 mL/min   Anion gap 14 5 - 15    Comment: Performed at [facility], [address], [place], [place] [address]  CBC with Differential/Platelet     Status: Abnormal   Collection Time: 11/24/19  5:55 AM  Result Value Ref Range   WBC 35.8 (H) 4.0 - 10.5 K/uL   RBC 5.06 4.22 - 5.81 MIL/uL   Hemoglobin 13.4 13.0 - 17.0  g/dL   HCT 09.3 81.8 - 29.9 %   MCV 79.2 (L) 80.0 - 100.0 fL   MCH 26.5 26.0 - 34.0 pg   MCHC 33.4 30.0 - 36.0 g/dL   RDW 37.1 69.6 - 78.9 %   Platelets 185 150 - 400 K/uL   nRBC 0.0 0.0 - 0.2 %   Neutrophils Relative % 86 %   Neutro Abs 30.5 (H) 1.7 - 7.7 K/uL   Lymphocytes Relative 3 %   Lymphs Abs 1.2 0.7 - 4.0 K/uL   Monocytes Relative 9 %   Monocytes Absolute 3.1 (H) 0.1 - 1.0 K/uL   Eosinophils Relative 0 %   Eosinophils Absolute 0.0 0.0 - 0.5 K/uL   Basophils Relative 0 %   Basophils Absolute 0.1 0.0 - 0.1 K/uL   Immature Granulocytes 2 %   Abs Immature Granulocytes 0.82 (H) 0.00 - 0.07 K/uL   Polychromasia PRESENT     Comment: Performed at [facility], [address] [place], [place] [address]  C-reactive protein     Status: Abnormal   Collection Time: 11/24/19  5:55 AM    Result Value Ref Range   CRP 19.4 (H) <1.0 mg/dL    Comment: Performed at [facility], [address] [place], [place] [address]  D-dimer, quantitative (not at [facility])     Status: Abnormal   Collection Time: 11/24/19  5:55 AM  Result Value Ref Range   D-Dimer, Quant 0.88 (H) 0.00 - 0.50 ug/mL-FEU    Comment: (NOTE) At the manufacturer cut-off of 0.50 ug/mL FEU, this assay has been documented to exclude PE with a sensitivity and negative predictive value of 97 to 99%.  At this time, this assay has not been approved by the FDA to exclude DVT/VTE. Results should be correlated with clinical presentation. Performed at [facility], [address], [place], [place] [address]   Ferritin     Status: Abnormal   Collection Time: 11/24/19  5:55 AM  Result Value Ref Range   Ferritin 1,298 (H) 24 - 336 ng/mL    Comment: Performed at [facility], [address] [place], [place] [address]  Procalcitonin     Status: None   Collection Time: 11/24/19  5:55 AM  Result Value Ref Range   Procalcitonin 3.68 ng/mL    Comment:        Interpretation: PCT > 2 ng/mL: Systemic infection (sepsis) is likely, unless other causes are known. (NOTE)       Sepsis PCT Algorithm           Lower Respiratory Tract                                      Infection PCT Algorithm    ----------------------------     ----------------------------         PCT < 0.25 ng/mL                PCT < 0.10 ng/mL         Strongly encourage             Strongly discourage   discontinuation of antibiotics    initiation of antibiotics    ----------------------------     -----------------------------       PCT 0.25 - 0.50 ng/mL            PCT 0.10 - 0.25 ng/mL  OR       >80% decrease in PCT            Discourage initiation of                                            antibiotics      Encourage discontinuation           of antibiotics     ----------------------------     -----------------------------         PCT >= 0.50 ng/mL              PCT 0.26 - 0.50 ng/mL               AND       <80% decrease in PCT              Encourage initiation of                                             antibiotics       Encourage continuation           of antibiotics    ----------------------------     -----------------------------        PCT >= 0.50 ng/mL                  PCT > 0.50 ng/mL               AND         increase in PCT                  Strongly encourage                                      initiation of antibiotics    Strongly encourage escalation           of antibiotics                                     -----------------------------                                           PCT <= 0.25 ng/mL                                                 OR                                        > 80% decrease in PCT                                     Discontinue / Do not initiate  antibiotics Performed at [facility], [address], [place], [place] [address]   Glucose, capillary     Status: Abnormal   Collection Time: 11/24/19  8:11 AM  Result Value Ref Range   Glucose-Capillary 258 (H) 70 - 99 mg/dL  Glucose, capillary     Status: Abnormal   Collection Time: 11/24/19 11:56 AM  Result Value Ref Range   Glucose-Capillary 320 (H) 70 - 99 mg/dL  Glucose, capillary     Status: Abnormal   Collection Time: 11/24/19  5:18 PM  Result Value Ref Range   Glucose-Capillary 316 (H) 70 - 99 mg/dL  Glucose, capillary     Status: Abnormal   Collection Time: 11/24/19  8:56 PM  Result Value Ref Range   Glucose-Capillary 216 (H) 70 - 99 mg/dL  Glucose, capillary     Status: Abnormal   Collection Time: 11/24/19 10:58 PM  Result Value Ref Range   Glucose-Capillary 192 (H) 70 - 99 mg/dL  Magnesium     Status: Abnormal   Collection Time: 11/25/19  4:07 AM  Result Value Ref Range     Magnesium 4.4 (H) 1.7 - 2.4 mg/dL    Comment: Performed at [facility], [address] [place], [place] [address]  Phosphorus     Status: Abnormal   Collection Time: 11/25/19  4:07 AM  Result Value Ref Range   Phosphorus 5.7 (H) 2.5 - 4.6 mg/dL    Comment: Performed at [facility], [address], [place], [place] [address]  Protime-INR     Status: Abnormal   Collection Time: 11/25/19  4:07 AM  Result Value Ref Range   Prothrombin Time 16.5 (H) 11.4 - 15.2 seconds   INR 1.3 (H) 0.8 - 1.2    Comment: (NOTE) INR goal varies based on device and disease states. Performed at [facility], [address] [place], [place] [address]   C-reactive protein     Status: Abnormal   Collection Time: 11/25/19  4:07 AM  Result Value Ref Range   CRP 23.3 (H) <1.0 mg/dL    Comment: Performed at [facility], [address], [place], [place] [address]  D-dimer, quantitative (not at [facility])     Status: Abnormal   Collection Time: 11/25/19  4:07 AM  Result Value Ref Range   D-Dimer, Quant 1.32 (H) 0.00 - 0.50 ug/mL-FEU    Comment: (NOTE) At the manufacturer cut-off of 0.50 ug/mL FEU, this assay has been documented to exclude PE with a sensitivity and negative predictive value of 97 to 99%.  At this time, this assay has not been approved by the FDA to exclude DVT/VTE. Results should be correlated with clinical presentation. Performed at [facility], [address] [place], [place] [address]   Ferritin     Status: Abnormal   Collection Time: 11/25/19  4:07 AM  Result Value Ref Range   Ferritin 1,853 (H) 24 - 336 ng/mL    Comment: Performed at [facility], [address], [place], [place] [address]  Glucose, capillary     Status: Abnormal   Collection Time: 11/25/19  7:15 AM  Result Value Ref Range   Glucose-Capillary 153 (H) 70 - 99 mg/dL  Body fluid cell count with  differential     Status: Abnormal   Collection Time: 11/25/19 11:33 AM  Result Value Ref Range   Fluid Type-FCT PLEURAL     Comment: RT   Color, Fluid  RED (A) YELLOW   Appearance, Fluid TURBID (A) CLEAR   Total Nucleated Cell Count, Fluid 117,299 (H) 0 - 1,000 cu mm    Comment: RESULTS CONFIRMED BY MANUAL DILUTION   Neutrophil Count, Fluid 85 (H) 0 - 25 %   Lymphs, Fluid 2 %   Monocyte-Macrophage-Serous Fluid 13 (L) 50 - 90 %    Comment: Performed at [facility], [address], [place], [place] [address]  Protein, pleural or peritoneal fluid     Status: None   Collection Time: 11/25/19 11:33 AM  Result Value Ref Range   Total protein, fluid 5.2 g/dL    Comment: (NOTE) No normal range established for this test Results should be evaluated in conjunction with serum values    Fluid Type-FTP Pleural R     Comment: Performed at [facility], [address] [place], [place] [address]  Lactate dehydrogenase (pleural or peritoneal fluid)     Status: Abnormal   Collection Time: 11/25/19 11:33 AM  Result Value Ref Range   LD, Fluid 2,447 (H) 3 - 23 U/L    Comment: (NOTE) Results should be evaluated in conjunction with serum values    Fluid Type-FLDH Pleural R     Comment: Performed at [facility], [address] [place], [place] [address]  Glucose, pleural or peritoneal fluid     Status: None   Collection Time: 11/25/19 11:33 AM  Result Value Ref Range   Glucose, Fluid <20 mg/dL   Fluid Type-FGLU PLEURAL     Comment: RT Performed at [facility], [address], [place], [place] [address]   Glucose, capillary     Status: Abnormal   Collection Time: 11/25/19 11:59 AM  Result Value Ref Range   Glucose-Capillary 211 (H) 70 - 99 mg/dL  Comprehensive metabolic panel     Status: Abnormal   Collection Time: 11/25/19 12:40 PM  Result Value Ref Range   Sodium 138 135 - 145 mmol/L    Comment: DELTA CHECK NOTED    Potassium 4.1 3.5 - 5.1 mmol/L    Comment: DELTA CHECK NOTED   Chloride 96 (L) 98 - 111 mmol/L   CO2 28 22 - 32 mmol/L   Glucose, Bld 205 (H) 70 - 99 mg/dL   BUN 62 (H) 6 - 20 mg/dL   Creatinine, Ser 2.95 (H) 0.61 - 1.24 mg/dL   Calcium 7.9 (L) 8.9 - 10.3 mg/dL   Total Protein 7.1 6.5 - 8.1 g/dL   Albumin 2.5 (L) 3.5 - 5.0 g/dL   AST 36 15 - 41 U/L   ALT 88 (H) 0 - 44 U/L   Alkaline Phosphatase 122 38 - 126 U/L   Total Bilirubin 0.8 0.3 - 1.2 mg/dL   GFR calc non Af Amer 56 (L) >60 mL/min   GFR calc Af Amer >60 >60 mL/min   Anion gap 14 5 - 15    Comment: Performed at [facility], [address], [place], [place] [address]  CBC with Differential/Platelet     Status: Abnormal   Collection Time: 11/25/19 12:40 PM  Result Value Ref Range   WBC 26.6 (H) 4.0 - 10.5 K/uL   RBC 4.74 4.22 - 5.81 MIL/uL   Hemoglobin 12.3 (L) 13.0 - 17.0 g/dL   HCT 86.5 (L) 78.4 - 69.6 %   MCV 79.3 (L) 80.0 - 100.0 fL   MCH 25.9 (L) 26.0 - 34.0 pg   MCHC 32.7 30.0 -  36.0 g/dL   RDW 16.1 09.6 - 04.5 %   Platelets 208 150 - 400 K/uL   nRBC 0.0 0.0 - 0.2 %   Neutrophils Relative % 87 %   Neutro Abs 23.2 (H) 1.7 - 7.7 K/uL   Lymphocytes Relative 3 %   Lymphs Abs 0.8 0.7 - 4.0 K/uL   Monocytes Relative 9 %   Monocytes Absolute 2.4 (H) 0.1 - 1.0 K/uL   Eosinophils Relative 0 %   Eosinophils Absolute 0.1 0.0 - 0.5 K/uL   Basophils Relative 0 %   Basophils Absolute 0.1 0.0 - 0.1 K/uL   Immature Granulocytes 1 %   Abs Immature Granulocytes 0.22 (H) 0.00 - 0.07 K/uL    Comment: Performed at [facility], [address], [place], [place] [address]  Lactate dehydrogenase     Status: Abnormal   Collection Time: 11/25/19 12:40 PM  Result Value Ref Range   LDH 350 (H) 98 - 192 U/L    Comment: Performed at [facility] [address], [place], [place] [address]  Protein, total     Status: None   Collection Time: 11/25/19 12:40 PM  Result Value Ref  Range   Total Protein 7.1 6.5 - 8.1 g/dL    Comment: Performed at [facility], [address], [place], [place] [address]  Glucose, capillary     Status: Abnormal   Collection Time: 11/25/19  4:25 PM  Result Value Ref Range   Glucose-Capillary 193 (H) 70 - 99 mg/dL      Component Value Date/Time   SDES  11/23/2019 1415    BLOOD LEFT HAND Performed at [facility], [address] [place], [place] [address]    SPECREQUEST  11/23/2019 1415    BOTTLES DRAWN AEROBIC ONLY Blood Culture adequate volume Performed at [facility], [address], [place], [place] [address]    CULT STAPHYLOCOCCUS AUREUS (A) 11/23/2019 1415   REPTSTATUS PENDING 11/23/2019 1415   CT CHEST WO CONTRAST  Addendum Date: 11/23/2019   ADDENDUM REPORT: 11/23/2019 21:59 ADDENDUM: Study discussed by telephone with Dr. Dwana Curd on 11/23/2019 at 2155 hours. Electronically Signed   By: Odessa Fleming M.D.   On: 11/23/2019 21:59   Result Date: 11/23/2019 CLINICAL DATA:  54 year old male COVID-19. Loculated right pleural effusion on CT Abdomen and Pelvis earlier today. EXAM: CT CHEST WITHOUT CONTRAST TECHNIQUE: Multidetector CT imaging of the chest was performed following the standard protocol without IV contrast. COMPARISON:  CT Abdomen and Pelvis 1317 hours today. Chest CT 11/11/2019. FINDINGS: Cardiovascular: Calcified coronary artery atherosclerosis. No cardiomegaly or pericardial effusion. Vascular patency is not evaluated in the absence of IV contrast. Mediastinum/Nodes: No mediastinal lymphadenopathy. Small volume of loculated pneumomediastinum is demonstrated on series 3, image 33 - new from December. Lungs/Pleura: Multiloculated right lung hydropneumothorax with a moderate to large volume of pleural fluid and small to moderate volume of pleural air. Superimposed are multifocal right lung consolidation and peribronchial ground-glass opacity in the non consolidated right lung.  Central airways remain patent. Patchy widespread ground-glass opacity in the left lung has regressed since 11/11/2019. No left pleural effusion. Upper Abdomen: Stable from the earlier CT Abdomen and Pelvis. Musculoskeletal: No acute osseous abnormality identified. IMPRESSION: 1. Multiloculated right lung hydropneumothorax, with a somewhat larger volume of pleural fluid relative to pleural air. 2. Underlying progressed multilobar right lung consolidation since the Chest CT on 11/11/2019. 3. Small volume loculated right side pneumomediastinum is new since December.  4. Regressed but not resolved left lung opacity since December due to COVID pneumonia. Electronically Signed: By: Odessa Fleming M.D. On: 11/23/2019 21:43   DG Chest Port 1 View  Result Date: 11/25/2019 CLINICAL DATA:  Pleural effusion EXAM: PORTABLE CHEST 1 VIEW COMPARISON:  Chest radiograph 11/25/2019 FINDINGS: No significant interval change as compared to chest radiograph performed earlier the same day. Redemonstrated multiloculated right hydropneumothorax with dominant right apical pneumothorax component measuring approximately 12.1 x 9.3 cm (previously 12.1 x 9.1 cm). Similar appearance of cystic changes within the right lung base. Redemonstrated intervening opacity within the right mid lung likely reflecting atelectasis. Grossly unchanged leftward mediastinal shift. Unchanged diffuse coarsened interstitial and airspace opacities within the left lung, mid to basilar predominant. Cardiomediastinal silhouette unchanged. Aortic atherosclerosis. No acute bony abnormality. Overlying cardiac monitoring leads. IMPRESSION: 1. No significant change as compared to chest radiograph performed earlier the same day. 2. Loculated right hydropneumothorax with dominant right apical pneumothorax component. Leftward mediastinal shift. 3. Extensive cystic changes within the right lower lung with right mid lung atelectasis. 4. Diffuse coarsened interstitial and airspace  opacities within the left lung. Electronically Signed   By: Jackey Loge DO   On: 11/25/2019 16:56   DG CHEST PORT 1 VIEW  Result Date: 11/25/2019 CLINICAL DATA:  Status post thoracentesis. EXAM: PORTABLE CHEST 1 VIEW COMPARISON:  CT chest 11/23/2019 FINDINGS: There is progressive shift of the mediastinum into the left hemithorax. Mild diffuse coarsened interstitial and airspace opacities are again noted within the left lung. No left pleural effusion identified. Status post right thoracentesis. There is a loculated pneumothorax within the right upper lobe which measures approximately 12.1 x 9.1 cm. Within the right lung base there are extensive cystic changes. Within the right midlung there is intervening area of increased opacification likely reflecting atelectatic lung. IMPRESSION: 1. Status post right thoracentesis with loculated pneumothorax within the right upper lobe. 2. Diffuse coarsened interstitial and airspace opacities within the left lung. 3. Extensive cystic changes within the right lower lung with right mid lung atelectatic change. Electronically Signed   By: Signa Kell M.D.   On: 11/25/2019 12:16   Recent Results (from the past 240 hour(s))  Culture, blood (routine x 2)     Status: Abnormal (Preliminary result)   Collection Time: 11/23/19  2:10 PM   Specimen: BLOOD  Result Value Ref Range Status   Specimen Description   Final    BLOOD LEFT ARM Performed at [facility], [address], [place], [place] [address]    Special Requests   Final    BOTTLES DRAWN AEROBIC ONLY Blood Culture adequate volume Performed at [facility], [address] [place], [place] [address]    Culture  Setup Time   Final    AEROBIC BOTTLE ONLY GRAM POSITIVE COCCI IN CLUSTERS CRITICAL RESULT CALLED TO, READ BACK BY AND VERIFIED WITH: E. WILLIAMSON, PHARMD (GVC) AT 1255 ON 11/24/19 BY C. JESSUP, MT. Performed at [facility], [address]  [place], [place] [address]    Culture STAPHYLOCOCCUS AUREUS (A)  Final   Report Status PENDING  Incomplete  Culture, blood (routine x 2)     Status: Abnormal (Preliminary result)   Collection Time: 11/23/19  2:15 PM   Specimen: BLOOD  Result Value Ref Range Status   Specimen Description   Final    BLOOD LEFT HAND Performed at [facility], [address] [place], [place] [address]    Special  Requests   Final    BOTTLES DRAWN AEROBIC ONLY Blood Culture adequate volume Performed at [facility], [address] [place], [place] [address]    Culture  Setup Time   Final    GRAM POSITIVE COCCI IN CLUSTERS AEROBIC BOTTLE ONLY CRITICAL RESULT CALLED TO, READ BACK BY AND VERIFIED WITH: K AMEND [facility] 2202 11/24/19 A BROWNING Performed at [facility], [address] [place], [place] [address]    Culture STAPHYLOCOCCUS AUREUS (A)  Final   Report Status PENDING  Incomplete  Blood Culture ID Panel (Reflexed)     Status: Abnormal   Collection Time: 11/23/19  2:15 PM  Result Value Ref Range Status   Enterococcus species NOT DETECTED NOT DETECTED Final   Listeria monocytogenes NOT DETECTED NOT DETECTED Final   Staphylococcus species DETECTED (A) NOT DETECTED Final    Comment: CRITICAL RESULT CALLED TO, READ BACK BY AND VERIFIED WITH: K AMEND PHARMD 2202 11/24/19 A BROWNING    Staphylococcus aureus (BCID) DETECTED (A) NOT DETECTED Final    Comment: Methicillin (oxacillin) susceptible Staphylococcus aureus (MSSA). Preferred therapy is anti staphylococcal beta lactam antibiotic (Cefazolin or Nafcillin), unless clinically contraindicated. CRITICAL RESULT CALLED TO, READ BACK BY AND VERIFIED WITH: K AMEND PHARMD 2202 11/24/19 A BROWNING    Methicillin resistance NOT DETECTED NOT DETECTED Final   Streptococcus species NOT DETECTED NOT DETECTED Final   Streptococcus agalactiae NOT DETECTED NOT DETECTED Final   Streptococcus pneumoniae NOT DETECTED NOT DETECTED  Final   Streptococcus pyogenes NOT DETECTED NOT DETECTED Final   Acinetobacter baumannii NOT DETECTED NOT DETECTED Final   Enterobacteriaceae species NOT DETECTED NOT DETECTED Final   Enterobacter cloacae complex NOT DETECTED NOT DETECTED Final   Escherichia coli NOT DETECTED NOT DETECTED Final   Klebsiella oxytoca NOT DETECTED NOT DETECTED Final   Klebsiella pneumoniae NOT DETECTED NOT DETECTED Final   Proteus species NOT DETECTED NOT DETECTED Final   Serratia marcescens NOT DETECTED NOT DETECTED Final   Haemophilus influenzae NOT DETECTED NOT DETECTED Final   Neisseria meningitidis NOT DETECTED NOT DETECTED Final   Pseudomonas aeruginosa NOT DETECTED NOT DETECTED Final   Candida albicans NOT DETECTED NOT DETECTED Final   Candida glabrata NOT DETECTED NOT DETECTED Final   Candida krusei NOT DETECTED NOT DETECTED Final   Candida parapsilosis NOT DETECTED NOT DETECTED Final   Candida tropicalis NOT DETECTED NOT DETECTED Final    Comment: Performed at [facility], [address] [place], [place] [address]    Microbiology: Recent Results (from the past 240 hour(s))  Culture, blood (routine x 2)     Status: Abnormal (Preliminary result)   Collection Time: 11/23/19  2:10 PM   Specimen: BLOOD  Result Value Ref Range Status   Specimen Description   Final    BLOOD LEFT ARM Performed at [facility], [address], [place], [place] [address]    Special Requests   Final    BOTTLES DRAWN AEROBIC ONLY Blood Culture adequate volume Performed at [facility], [address] [place], [place] [address]    Culture  Setup Time   Final    AEROBIC BOTTLE ONLY GRAM POSITIVE COCCI IN CLUSTERS CRITICAL RESULT CALLED TO, READ BACK BY AND VERIFIED WITH: E. WILLIAMSON, PHARMD (GVC) AT 1255 ON 11/24/19 BY C. JESSUP, MT. Performed at [facility], [address], [place], [place] [address]    Culture STAPHYLOCOCCUS AUREUS (A)  Final   Report Status  PENDING  Incomplete  Culture, blood (routine x 2)     Status: Abnormal (Preliminary result)   Collection Time: 11/23/19  2:15 PM   Specimen: BLOOD  Result Value Ref Range Status   Specimen Description   Final    BLOOD LEFT HAND Performed at [facility], [address] [place], [place] [address]    Special Requests   Final    BOTTLES DRAWN AEROBIC ONLY Blood Culture adequate volume Performed at [facility], [address] [place], [place] [address]    Culture  Setup Time   Final    GRAM POSITIVE COCCI IN CLUSTERS AEROBIC BOTTLE ONLY CRITICAL RESULT CALLED TO, READ BACK BY AND VERIFIED WITH: K [facility]END [facility] 2202 11/24/19 A BROWNING Performed at [facility], [address] [place], [place] [address]    Culture STAPHYLOCOCCUS AUREUS (A)  Final   Report Status PENDING  Incomplete  Blood Culture ID Panel (Reflexed)     Status: Abnormal   Collection Time: 11/23/19  2:15 PM  Result Value Ref Range Status   Enterococcus species NOT DETECTED NOT DETECTED Final   Listeria monocytogenes NOT DETECTED NOT DETECTED Final   Staphylococcus species DETECTED (A) NOT DETECTED Final    Comment: CRITICAL RESULT CALLED TO, READ BACK BY AND VERIFIED WITH: K AMEND PHARMD 2202 11/24/19 A BROWNING    Staphylococcus aureus (BCID) DETECTED (A) NOT DETECTED Final    Comment: Methicillin (oxacillin) susceptible Staphylococcus aureus (MSSA). Preferred therapy is anti staphylococcal beta lactam antibiotic (Cefazolin or Nafcillin), unless clinically contraindicated. CRITICAL RESULT CALLED TO, READ BACK BY AND VERIFIED WITH: K AMEND PHARMD 2202 11/24/19 A BROWNING    Methicillin resistance NOT DETECTED NOT DETECTED Final   Streptococcus species NOT DETECTED NOT DETECTED Final   Streptococcus agalactiae NOT DETECTED NOT DETECTED Final   Streptococcus pneumoniae NOT DETECTED NOT DETECTED Final   Streptococcus pyogenes NOT DETECTED NOT DETECTED Final   Acinetobacter  baumannii NOT DETECTED NOT DETECTED Final   Enterobacteriaceae species NOT DETECTED NOT DETECTED Final   Enterobacter cloacae complex NOT DETECTED NOT DETECTED Final   Escherichia coli NOT DETECTED NOT DETECTED Final   Klebsiella oxytoca NOT DETECTED NOT DETECTED Final   Klebsiella pneumoniae NOT DETECTED NOT DETECTED Final   Proteus species NOT DETECTED NOT DETECTED Final   Serratia marcescens NOT DETECTED NOT DETECTED Final   Haemophilus influenzae NOT DETECTED NOT DETECTED Final   Neisseria meningitidis NOT DETECTED NOT DETECTED Final   Pseudomonas aeruginosa NOT DETECTED NOT DETECTED Final   Candida albicans NOT DETECTED NOT DETECTED Final   Candida glabrata NOT DETECTED NOT DETECTED Final   Candida krusei NOT DETECTED NOT DETECTED Final   Candida parapsilosis NOT DETECTED NOT DETECTED Final   Candida tropicalis NOT DETECTED NOT DETECTED Final    Comment: Performed at [facility], [address] [place], [place] [address]    Radiographs and labs were personally reviewed by me.        [facility] Staphylococcus aureus bacteremia   Staphylococcus aureus bacteremia (SAB) is associated with a high rate of complications and mortality.  Specific aspects of clinical management are critical to optimizing the outcome of patients with SAB.  Therefore, the [facility] Management Team [facility]) has initiated an intervention aimed at improving the management of SAB at [facility].  To do so, Infectious Diseases physicians are providing an evidence-based consult for the management of all patients with SAB.  Yes No Comments  Perform follow-up blood cultures (even if the patient is afebrile) to ensure clearance of bacteremia [x]  []    Remove vascular catheter and obtain follow-up blood cultures after the removal of the catheter []  [x]    Perform echocardiography to evaluate for endocarditis (transthoracic ECHO is 40-50% sensitive, TEE is > 90%  sensitive) [x]  []  Please keep in mind, that neither test can definitively EXCLUDE endocarditis, and that should clinical suspicion remain high for endocarditis the patient should then still be treated with an "endocarditis" duration of therapy = 6 weeks  Consult electrophysiologist to evaluate implanted cardiac device (pacemaker, ICD) []  []    Ensure source control []  []  Have all abscesses been drained effectively? Have deep seeded infections (septic joints or osteomyelitis) had appropriate surgical debridement?  Investigate for "metastatic" sites of infection []  []  Does the patient have ANY symptom or physical exam finding that would suggest a deeper infection (back or neck pain that may be suggestive of vertebral osteomyelitis or epidural abscess, muscle pain that could be a symptom of pyomyositis)?  Keep in mind that for deep seeded infections MRI imaging with contrast is preferred rather than other often insensitive tests such as plain x-rays, especially early in a patient's presentation.  Change antibiotic therapy to __________________ []  [x]  Beta-lactam antibiotics are preferred for MSSA due to higher cure rates.   If on Vancomycin, goal trough should be 15 - 20 mcg/mL  Estimated duration of IV antibiotic therapy:   []  []  Consult case management for probably prolonged outpatient IV antibiotic therapy     , MD [facility] for Infectious Disease [facility] [phone] 11/25/2019, 5:43 PM

## 2019-11-25 NOTE — Progress Notes (Signed)
SLP Cancellation Note  Patient Details Name: Nathaniel Webb MRN: [id number] DOB: 11-10-1966   Cancelled treatment:       Reason Eval/Treat Not Completed: Medical issues which prohibited therapy. NPO for procedure.  Please return to regular, nectar thick liquids post-procedure.   Thank you,  Donyel Castagnola L. Samson Frederic, MA CCC/SLP Acute Rehabilitation Services Office number [phone]     Blenda Mounts Laurice 11/25/2019, 9:31 AM

## 2019-11-25 NOTE — Plan of Care (Signed)
  Problem: Education: Goal: Knowledge of risk factors and measures for prevention of condition will improve Outcome: Progressing   Problem: Coping: Goal: Psychosocial and spiritual needs will be supported Outcome: Progressing   Problem: Respiratory: Goal: Will maintain a patent airway Outcome: Progressing Goal: Complications related to the disease process, condition or treatment will be avoided or minimized Outcome: Progressing   

## 2019-11-25 NOTE — Procedures (Signed)
Thoracentesis Procedure Note  Pre-operative Diagnosis: RT parapneumonic effusion/empyema  Post-operative Diagnosis: RT hemothorax  Indications: Right pleural effusion with leukocytosis and acute hypoxic respiratory failure due to Covid  Procedure Details  Consent: Informed consent was obtained. Risks of the procedure were discussed including: infection, bleeding, pain, pneumothorax.  Ultrasound was performed to mark 0.4 thoracenteses.  This showed echo-free space with some stranding Under sterile conditions the patient was positioned. Betadine solution and sterile drapes were utilized.  2% buffered lidocaine was used to anesthetize the RT 7th  rib space.  Hemorrhagic fluid was obtained without any difficulties and minimal blood loss.  A dressing was applied to the wound and wound care instructions were provided.   Findings 200 ml of bloody pleural fluid was obtained. A sample was sent  for chemistry and cell counts, as well as for infection analysis and cytology  Complications:  None; patient tolerated the procedure well.   Postprocedure bedside ultrasound showed sliding lung.  X-ray suggested right apical pneumothorax however review of his CT prior to procedure shows similar right apical loculated pneumothorax versus bulla        Condition: stable  Plan A follow up chest x-ray was ordered. Bed Rest for 2 hours. Tylenol 650 mg. for pain.  Attending Attestation: I performed the procedure.  Comer Locket Vassie Loll MD

## 2019-11-26 ENCOUNTER — Other Ambulatory Visit: Payer: Self-pay

## 2019-11-26 ENCOUNTER — Inpatient Hospital Stay (HOSPITAL_COMMUNITY): Payer: Medicaid Other

## 2019-11-26 DIAGNOSIS — J948 Other specified pleural conditions: Secondary | ICD-10-CM

## 2019-11-26 DIAGNOSIS — J869 Pyothorax without fistula: Secondary | ICD-10-CM

## 2019-11-26 DIAGNOSIS — J1282 Pneumonia due to coronavirus disease 2019: Secondary | ICD-10-CM

## 2019-11-26 LAB — COMPREHENSIVE METABOLIC PANEL
ALT: 82 U/L — ABNORMAL HIGH (ref 0–44)
AST: 49 U/L — ABNORMAL HIGH (ref 15–41)
Albumin: 2.5 g/dL — ABNORMAL LOW (ref 3.5–5.0)
Alkaline Phosphatase: 137 U/L — ABNORMAL HIGH (ref 38–126)
Anion gap: 13 (ref 5–15)
BUN: 56 mg/dL — ABNORMAL HIGH (ref 6–20)
CO2: 30 mmol/L (ref 22–32)
Calcium: 7.8 mg/dL — ABNORMAL LOW (ref 8.9–10.3)
Chloride: 94 mmol/L — ABNORMAL LOW (ref 98–111)
Creatinine, Ser: 1.46 mg/dL — ABNORMAL HIGH (ref 0.61–1.24)
GFR calc Af Amer: >60 mL/min (ref >60)
GFR calc non Af Amer: 55 mL/min — ABNORMAL LOW (ref >60)
Glucose, Bld: 169 mg/dL — ABNORMAL HIGH (ref 70–99)
Potassium: 4.2 mmol/L (ref 3.5–5.1)
Sodium: 137 mmol/L (ref 135–145)
Total Bilirubin: 0.9 mg/dL (ref 0.3–1.2)
Total Protein: 7.1 g/dL (ref 6.5–8.1)

## 2019-11-26 LAB — CBC WITH DIFFERENTIAL/PLATELET
Abs Immature Granulocytes: 0.37 10*3/uL — ABNORMAL HIGH (ref 0.00–0.07)
Basophils Absolute: 0.1 10*3/uL (ref 0.0–0.1)
Basophils Relative: 0 %
Eosinophils Absolute: 0 10*3/uL (ref 0.0–0.5)
Eosinophils Relative: 0 %
HCT: 36.5 % — ABNORMAL LOW (ref 39.0–52.0)
Hemoglobin: 11.9 g/dL — ABNORMAL LOW (ref 13.0–17.0)
Immature Granulocytes: 1 %
Lymphocytes Relative: 6 %
Lymphs Abs: 1.7 10*3/uL (ref 0.7–4.0)
MCH: 26.5 pg (ref 26.0–34.0)
MCHC: 32.6 g/dL (ref 30.0–36.0)
MCV: 81.3 fL (ref 80.0–100.0)
Monocytes Absolute: 3.3 10*3/uL — ABNORMAL HIGH (ref 0.1–1.0)
Monocytes Relative: 12 %
Neutro Abs: 21.5 10*3/uL — ABNORMAL HIGH (ref 1.7–7.7)
Neutrophils Relative %: 81 %
Platelets: 200 10*3/uL (ref 150–400)
RBC: 4.49 MIL/uL (ref 4.22–5.81)
RDW: 15.7 % — ABNORMAL HIGH (ref 11.5–15.5)
WBC: 26.9 10*3/uL — ABNORMAL HIGH (ref 4.0–10.5)
nRBC: 0 % (ref 0.0–0.2)

## 2019-11-26 LAB — GLUCOSE, CAPILLARY
Glucose-Capillary: 147 mg/dL — ABNORMAL HIGH (ref 70–99)
Glucose-Capillary: 398 mg/dL — ABNORMAL HIGH (ref 70–99)
Glucose-Capillary: 355 mg/dL — ABNORMAL HIGH (ref 70–99)
Glucose-Capillary: 249 mg/dL — ABNORMAL HIGH (ref 70–99)

## 2019-11-26 LAB — D-DIMER, QUANTITATIVE: D-Dimer, Quant: 1.81 ug/mL-FEU — ABNORMAL HIGH (ref 0.00–0.50)

## 2019-11-26 LAB — CULTURE, BLOOD (ROUTINE X 2)
Special Requests: ADEQUATE
Special Requests: ADEQUATE

## 2019-11-26 LAB — C-REACTIVE PROTEIN: CRP: 28.4 mg/dL — ABNORMAL HIGH (ref <1.0)

## 2019-11-26 LAB — APTT: aPTT: 69 seconds — ABNORMAL HIGH (ref 24–36)

## 2019-11-26 LAB — FERRITIN: Ferritin: 2001 ng/mL — ABNORMAL HIGH (ref 24–336)

## 2019-11-26 LAB — PHOSPHORUS: Phosphorus: 4.5 mg/dL (ref 2.5–4.6)

## 2019-11-26 LAB — PROTIME-INR
INR: 1.3 — ABNORMAL HIGH (ref 0.8–1.2)
Prothrombin Time: 15.9 seconds — ABNORMAL HIGH (ref 11.4–15.2)

## 2019-11-26 LAB — MAGNESIUM: Magnesium: 4.2 mg/dL — ABNORMAL HIGH (ref 1.7–2.4)

## 2019-11-26 LAB — CYTOLOGY - NON PAP

## 2019-11-26 LAB — HEPARIN LEVEL (UNFRACTIONATED)
Heparin Unfractionated: 0.49 IU/mL (ref 0.30–0.70)
Heparin Unfractionated: 0.49 IU/mL (ref 0.30–0.70)

## 2019-11-26 MED ORDER — METHYLPREDNISOLONE SODIUM SUCC 40 MG IJ SOLR
30.0000 mg | Freq: Every day | INTRAMUSCULAR | Status: DC
  Administered 2019-11-27 – 2019-11-29 (×3): 30 mg via INTRAVENOUS
  Filled 2019-11-26 (×3): qty 1

## 2019-11-26 MED ORDER — HEPARIN BOLUS VIA INFUSION
4000.0000 [IU] | Freq: Once | INTRAVENOUS | Status: AC
  Administered 2019-11-26: 15:00:00 4000 [IU] via INTRAVENOUS
  Filled 2019-11-26: qty 4000

## 2019-11-26 MED ORDER — ROCURONIUM BROMIDE 10 MG/ML (PF) SYRINGE
PREFILLED_SYRINGE | INTRAVENOUS | Status: AC
  Filled 2019-11-26: qty 10

## 2019-11-26 MED ORDER — MIDAZOLAM HCL 2 MG/2ML IJ SOLN
INTRAMUSCULAR | Status: AC
  Filled 2019-11-26: qty 4

## 2019-11-26 MED ORDER — METOPROLOL TARTRATE 5 MG/5ML IV SOLN
5.0000 mg | INTRAVENOUS | Status: DC | PRN
  Administered 2019-11-26 – 2019-11-27 (×2): 5 mg via INTRAVENOUS
  Filled 2019-11-26 (×2): qty 5

## 2019-11-26 MED ORDER — CHLORHEXIDINE GLUCONATE CLOTH 2 % EX PADS
6.0000 | MEDICATED_PAD | Freq: Every day | CUTANEOUS | Status: DC
  Administered 2019-11-26 – 2019-12-14 (×16): 6 via TOPICAL

## 2019-11-26 MED ORDER — INSULIN DETEMIR 100 UNIT/ML ~~LOC~~ SOLN
20.0000 [IU] | Freq: Two times a day (BID) | SUBCUTANEOUS | Status: DC
  Administered 2019-11-26 – 2019-11-27 (×2): 20 [IU] via SUBCUTANEOUS
  Filled 2019-11-26 (×3): qty 0.2

## 2019-11-26 MED ORDER — METOPROLOL TARTRATE 5 MG/5ML IV SOLN
2.5000 mg | Freq: Once | INTRAVENOUS | Status: AC
  Administered 2019-11-26: 2.5 mg via INTRAVENOUS
  Filled 2019-11-26: qty 5

## 2019-11-26 MED ORDER — FENTANYL CITRATE (PF) 100 MCG/2ML IJ SOLN
INTRAMUSCULAR | Status: AC
  Administered 2019-11-26: 50 ug
  Filled 2019-11-26: qty 2

## 2019-11-26 MED ORDER — HEPARIN (PORCINE) 25000 UT/250ML-% IV SOLN
1400.0000 [IU]/h | INTRAVENOUS | Status: DC
  Administered 2019-11-26 – 2019-11-29 (×5): 1400 [IU]/h via INTRAVENOUS
  Filled 2019-11-26 (×5): qty 250

## 2019-11-26 MED ORDER — ETOMIDATE 2 MG/ML IV SOLN
INTRAVENOUS | Status: AC
  Filled 2019-11-26: qty 20

## 2019-11-26 NOTE — Progress Notes (Signed)
Noted positive help with meds given to pt, HR between 130-142, bp wnl, rr wnl, pt resting, comfortable, denies concerns, increased SOB, palpitations, chest discomfort or tightness. Will report to day shift, once the pts vital signs mantained stable, pt will have routine CTscan as ordered for today. Pt did not get any Heparin, IV or SQ last pm/shift as his heparin (unfractioned) was high with both blood draws, at 0.78 and 0.49

## 2019-11-26 NOTE — Procedures (Signed)
Chest Tube Insertion Procedure Note  Indications:  Clinically significant HydroPneumothorax  Pre-operative Diagnosis: HydroPneumothorax  Post-operative Diagnosis: HydroPneumothorax and Empyema  Procedure Details  Informed consent was obtained for the procedure, including sedation.  Risks of lung perforation, hemorrhage, arrhythmia, and adverse drug reaction were discussed.   After sterile skin prep, using standard technique, a 14 French tube was placed in the right anterior 7th rib space.  Findings: 200 ml of serosanguinous fluid obtained , gush of air   Estimated Blood Loss:  Minimal         Specimens:  None              Complications:  None; patient tolerated the procedure well.         Disposition: ICU - RR decreased & satn improed from 85 to 94% after procedure  CXR shows paritally expanded lung         Condition: stable  Attending Attestation: I performed the procedure.  Comer Locket Vassie Loll MD

## 2019-11-26 NOTE — Progress Notes (Signed)
Rapid Response Event Note  Overview:  RN called to room for help transporting to ICU.    Initial Focused Assessment: Pt. In distress, breathing 40 times a minute, saturations in the low 80s, HR elevated  Interventions: Transfer to ICU for emergent Chest Tube Plan of Care (if not transferred):  Event Summary: ICU Attending and NP at bedside for placement of emergent chest tube.   Nathaniel Webb Nathaniel Webb

## 2019-11-26 NOTE — Progress Notes (Signed)
Patient in afib with RVR. Asymptomatic. EKG obtained. MD Vassie Loll notified. Orders obtained for Metoprolol 5mg  IV Q4H PRN.

## 2019-11-26 NOTE — Plan of Care (Signed)
  Problem: Education: Goal: Knowledge of risk factors and measures for prevention of condition will improve Outcome: Progressing   Problem: Coping: Goal: Psychosocial and spiritual needs will be supported Outcome: Progressing   Problem: Respiratory: Goal: Will maintain a patent airway Outcome: Progressing   

## 2019-11-26 NOTE — Progress Notes (Addendum)
1610 Notified Dr. Ella Jubilee that "pt has no breath sounds upper right lung, very decreased right lower lobe, Hr 150-170, GIVING ORAL METOP 25MG  NOW"  [phone] Dr. [id number] responded "Ok. I will be there in a few more minutes, is blood pressure Ok?"   [phone] Notified Dr. [id number] that pt BP 100/80, Mews is red. HR was not elevated like for past 3 days Dr. Ella Jubilee responded Ok, I will be there in a few more minutes  1115 Notified Dr. Ella Jubilee that pt is now labored breathing and increased WOB, just came back from CT scan of chest.  Dr. Ella Jubilee came to bedside to see patient. By the time Dr. Ella Jubilee arrived oxygen sats were 84% on HFNC & NRB at 15 LPM via a splitter.  Patient transferred to ICU.

## 2019-11-26 NOTE — Progress Notes (Signed)
ANTICOAGULATION CONSULT NOTE - Follow Up Consult  Pharmacy Consult for Heparin Indication: PE  No Known Allergies  Patient Measurements: Height: 5\' 5"  (165.1 cm) Weight: 201 lb 4.5 oz (91.3 kg) IBW/kg (Calculated) : 61.5 Heparin Dosing Weight: 85 kg  Vital Signs: Temp: 99.1 F (37.3 C) (01/13 1250) Temp Source: Axillary (01/13 1250) BP: 130/86 (01/13 1400) Pulse Rate: 93 (01/13 1400)  Labs: Recent Labs    11/24/19 0555 11/25/19 0407 11/25/19 1240 11/25/19 1812 11/26/19 0326  HGB 13.4  --  12.3*  --  11.9*  HCT 40.1  --  37.6*  --  36.5*  PLT 185  --  208  --  200  APTT  --   --   --  32  --   LABPROT 23.7* 16.5*  --   --  15.9*  INR 2.1* 1.3*  --   --  1.3*  HEPARINUNFRC  --   --   --  0.78* 0.49  CREATININE 1.31*  --  1.42*  --  1.46*    Estimated Creatinine Clearance: 61.4 mL/min (A) (by C-G formula based on SCr of 1.46 mg/dL (H)).   Medications:  Scheduled:  . vitamin C  500 mg Oral Daily  . bisacodyl  5 mg Oral Daily  . Chlorhexidine Gluconate Cloth  6 each Topical Daily  . docusate sodium  100 mg Oral BID  . etomidate      . insulin aspart  0-20 Units Subcutaneous TID WC  . insulin detemir  20 Units Subcutaneous BID  . [START ON 11/27/2019] methylPREDNISolone (SOLU-MEDROL) injection  30 mg Intravenous Daily  . metoprolol tartrate  25 mg Oral BID  . polyethylene glycol  17 g Oral Daily  . sodium chloride flush  3 mL Intravenous Q12H  . zinc sulfate  220 mg Oral Daily   Infusions:  . sodium chloride    . sodium chloride    . ceFEPime (MAXIPIME) IV 2 g (11/26/19 0830)  . linezolid (ZYVOX) IV 600 mg (11/26/19 1302)  . metronidazole 500 mg (11/26/19 1450)    Assessment: 54 yo male with COVID pneumonitis started on Eliquis for PE, held yesterday for thoracentesis today for evaluation of pleural effusion.  To start IV heparin today with plans to resume Eliquis in 24-48hr if no bleeding.  Most recent dose of Eliquis 5mg  on 1/11 at 08:14 Heparin Level  0.49 with AM labs (no heparin started overnight).  Level is falsely elevated d/t recent Eliquis.  Will use APTT for dosing changes until both levels correlate. SCr 1.4 CBC: Hgb low/stable at 11.9, Plt 200 Chest tube placed 1/13 without noted complications.    Goal of Therapy:  Heparin level 0.3-0.7 units/ml aPTT 66-102 seconds  (OK to use full goal) Monitor platelets by anticoagulation protocol: Yes   Plan:  Give heparin 4000 units bolus IV x 1 Start heparin IV infusion at 1400 units/hr APTT 6 hours after starting Daily APTT, heparin level, and CBC Continue to monitor H&H and platelets   3/11 PharmD, BCPS Clinical pharmacist phone 7am- 5pm: [phone] 11/26/2019 2:54 PM

## 2019-11-26 NOTE — Progress Notes (Signed)
Spoke to Dr Dwana Curd regarding pts heparin (unfractioned), and clotting factors, PTT, INR. Also informed MD of thoracentesis today, plans for possible Chest tube tomorrow after CT done and resulted. This RN concerned about starting heparin gtt at 14units, d/t pts anticoagulation status. As per Dr Dwana Curd, pt will have another heparin level done at midnight, if <0.3, we may start gtt, orders to follow once labs resulted.

## 2019-11-26 NOTE — Progress Notes (Signed)
Lopressor as per orders given will manage and monitor effectivenss of the med and report back to MD, Call to CT to set up scan for this am will be done by next shift, after pts vital signs are more stable.

## 2019-11-26 NOTE — Progress Notes (Signed)
sPOKE TO DdR vERA, GAVE PT NITRO X 1 FOR CHEST TIGHTNESS, AND HEART RATES IN THE 150-170, TELE STRIP IN CHART. PT ALSO SAID STOMACH UPSET/ACHE, ZOFRAN GIVEN AS WELL, AT THIS TIME THE HR 150'S, BP 130/110'S, RR 30'S,  AFTER ABOVE GIVEN AND SPOKE TO DR VERA, PTS STATES '"FEELS BETTER", DR VERA WOULD LIKE METOPROLOL GIVEN IVSP D/T HIS CURRENT HEART RATE /TACHY RHYTHM

## 2019-11-26 NOTE — Progress Notes (Signed)
SLP Cancellation Note  Patient Details Name: Nathaniel Webb MRN: [id number] DOB: 1966/05/27   Cancelled treatment:       Reason Eval/Treat Not Completed: Medical issues which prohibited therapy; pt was being emergently transferred to ICU.   Secure chat-messaged RN when it was observed that diet had been changed to full liquids without thickener.  Pt's diet changed to full liquids thickened to nectar given concerns for dysphagia on 1/11.  SLP will follow.  Nathaniel Imes L. Samson Frederic, MA CCC/SLP Acute Rehabilitation Services   Nathaniel Webb 11/26/2019, 5:24 PM

## 2019-11-26 NOTE — Progress Notes (Signed)
PROGRESS NOTE    Nathaniel Webb  FIE:[id number] DOB: 10-02-1966 DOA: 11/11/2019 PCP: Rolm Gala, NP    Brief Narrative:  54 year old male who was transferred from [facility] for management of acute hypoxic respiratory failure, due to SARS COVID-19 viral pneumonia.  He does have history of type II that is mellitus, hypertension, dyslipidemia and obesity.  He reported 7 days of worsening and progressive dyspnea.  December 23 he was diagnosed with COVID-19 and managed as an outpatient.  He was admitted to the hospital on December 27 due to persistent symptoms and severe hypoxemia, oxygen saturation 50%.  Transferred to [facility] December 29, his lungs had bibasilar Rales, heart S1-S2 present rhythmic, abdomen soft, no lower extremity edema. His initial chest radiograph had bilateral interstitial infiltrates, right upper lobe, right lower lobe, left upper lobe.   Patient was admitted to the hospital working diagnosis of acute hypoxic respiratory failure due to SARS COVID-19 viral pneumonia.  Patient has been treated with steroids, remdesivir and Actemra.  His viral pneumonia has been complicated by a right hydropneumothorax and bacterial over infection.  Patient underwent thoracentesis January 12.  Today with worsening respiratory distress and hypoxemia, CT chest personally reviewed, with worsening pneumothorax at the right apex. Patient underwent emergent placement of chest tube.     Assessment & Plan:   Principal Problem:   Acute respiratory failure with hypoxemia (HCC) Active Problems:   Type 2 diabetes, controlled, with peripheral neuropathy (HCC)   Hypertension   Morbid obesity (HCC)   Pneumonia due to COVID-19 virus   Diabetes mellitus type 2, uncontrolled, with complications (HCC)   Acute pulmonary embolus (HCC)   Demand ischemia (HCC)   Constipation   Loculated pleural effusion   MSSA bacteremia   1. Acute hypoxemic respiratory failure due to SARS COVID 19  viral pneumonia, complicated with bacterial pneumonia, right empyema and right pneumothorax. Patient with worsening respiratory distress, hypoxemia, and CT suggesting worsening pneumothorax (personally reviewed). Discussed with Dr. Vassie Loll from pulmonary, and patient was transferred to ICU for emergent right chest tube placement.   Persistent leukocytosis 26.9. Blood cultures on 1/10, positive for Staphylococcus aureus. Fluid culture positive for gram positive cocci in pairs. Fluid cell count 117,299, turbid, 85% PMN.   Will continue antibiotic therapy with Cefepime, Metronidazole, and Linezolid.   Patient has completed Remdesivir therapy and one dose of tocilizumab, now will start tapering systemic steroids.   2. Acute pulmonary embolism. Preserved LV and RV function, will continue anticoagulation with heparin for now, due recent invasive procedure.   3. Acute on chronic diastolic heart failure. Patient has received diuresis with furosemide, this am is euvolemic, will continue to hold on diuresis for now. Continue blood pressure monitoring.   4. HTN. Continue blood pressure monitoring. Continue bid metoprolol.   5. AKI with hyperkalemia and hyponatremia. Renal function with serum cr at 1,46 with K at 4,2 and serum Na at 137. Will continue gentle hydration for now with saline at 50 ml per H.  6. Uncontrolled T2DM ( Hgb A1c 7.3), complicated with steroid induced hyperglycemia. Fasting glucose this am 169, will continue glucose cover and monitoring with sliding scale. Basal insulin 20 units bid of detemir. Will start steroid taper.     DVT prophylaxis: heparin   Code Status:  full Family Communication: no family at the bedside  Disposition Plan/ discharge barriers:  Patient critically ill transferred to ICU.  Patient is critically ill, required chest tube to prevent worsening respiratory failure.  Critical care  time is 60 minutes.     Consultants:   ID  Pulmonary   Procedures:    1/12 thoracentesis right  1/13 right chest tube  Antimicrobials:   Cefepime  Metronidazole  Linezolid      Subjective: Patient with rapid worsening respiratory distress, no chest pain, no nausea or vomiting.   Objective: Vitals:   11/26/19 0400 11/26/19 0500 11/26/19 0836 11/26/19 0916  BP:   100/80 100/80  Pulse:   (!) 155 (!) 108  Resp:   (!) 31 (!) 36  Temp: 98.4 F (36.9 C)     TempSrc:      SpO2:   95% 92%  Weight:  91.3 kg    Height:        Intake/Output Summary (Last 24 hours) at 11/26/2019 0950 Last data filed at 11/26/2019 0830 Gross per 24 hour  Intake 1793 ml  Output 1752 ml  Net 41 ml   Filed Weights   11/23/19 0421 11/25/19 0500 11/26/19 0500  Weight: 95.5 kg 94.8 kg 91.3 kg    Examination:   General: positive acute distress. Positive dyspnea Neurology: Awake and alert, non focal  E ENT: no pallor, no icterus, oral mucosa moist. Positive accessory muscle use.  Cardiovascular: No JVD. S1-S2 present, rhythmic, no gallops, rubs, or murmurs. No lower extremity edema. Pulmonary: decreased breath sounds at the right apex with decreased bilateral air movement, no wheezing, rhonchi or rales. Gastrointestinal. Abdomen with no organomegaly, non tender, no rebound or guarding Skin. No rashes Musculoskeletal: no joint deformities     Data Reviewed: I have personally reviewed following labs and imaging studies  CBC: Recent Labs  Lab 11/22/19 0408 11/23/19 0429 11/24/19 0555 11/25/19 1240 11/26/19 0326  WBC 49.2* 46.2* 35.8* 26.6* 26.9*  NEUTROABS 43.9* 40.8* 30.5* 23.2* 21.5*  HGB 15.1 14.3 13.4 12.3* 11.9*  HCT 46.4 43.8 40.1 37.6* 36.5*  MCV 81.4 80.1 79.2* 79.3* 81.3  PLT 220 217 185 208 149   Basic Metabolic Panel: Recent Labs  Lab 11/22/19 0408 11/23/19 0429 11/24/19 0555 11/25/19 0407 11/25/19 1240 11/26/19 0326  NA 132* 128* 130*  --  138 137  K 4.9 5.3* 5.2*  --  4.1 4.2  CL 91* 87* 92*  --  96* 94*  CO2 27 27 24   --  28  30  GLUCOSE 169* 179* 189*  --  205* 169*  BUN 33* 50* 63*  --  62* 56*  CREATININE 1.17 1.28* 1.31*  --  1.42* 1.46*  CALCIUM 8.4* 8.3* 8.2*  --  7.9* 7.8*  MG 3.5* 3.5* 4.2* 4.4*  --  4.2*  PHOS 4.4 4.7* 5.0* 5.7*  --  4.5   GFR: Estimated Creatinine Clearance: 61.4 mL/min (A) (by C-G formula based on SCr of 1.46 mg/dL (H)). Liver Function Tests: Recent Labs  Lab 11/22/19 0408 11/23/19 0429 11/24/19 0555 11/25/19 1240 11/26/19 0326  AST 43* 29 36 36 49*  ALT 106* 68* 76* 88* 82*  ALKPHOS 115 133* 116 122 137*  BILITOT 0.8 1.0 1.0 0.8 0.9  PROT 7.0 6.9 6.8 7.1  7.1 7.1  ALBUMIN 3.5 3.0* 2.8* 2.5* 2.5*   No results for input(s): LIPASE, AMYLASE in the last 168 hours. No results for input(s): AMMONIA in the last 168 hours. Coagulation Profile: Recent Labs  Lab 11/22/19 0408 11/23/19 0429 11/24/19 0555 11/25/19 0407 11/26/19 0326  INR 2.3* 1.7* 2.1* 1.3* 1.3*   Cardiac Enzymes: No results for input(s): CKTOTAL, CKMB, CKMBINDEX, TROPONINI in the last 168  hours. BNP (last 3 results) No results for input(s): PROBNP in the last 8760 hours. HbA1C: No results for input(s): HGBA1C in the last 72 hours. CBG: Recent Labs  Lab 11/25/19 0715 11/25/19 1159 11/25/19 1625 11/25/19 2024 11/26/19 0751  GLUCAP 153* 211* 193* 193* 147*   Lipid Profile: No results for input(s): CHOL, HDL, LDLCALC, TRIG, CHOLHDL, LDLDIRECT in the last 72 hours. Thyroid Function Tests: No results for input(s): TSH, T4TOTAL, FREET4, T3FREE, THYROIDAB in the last 72 hours. Anemia Panel: Recent Labs    11/25/19 0407 11/26/19 0326  FERRITIN 1,853* 2,001*      Radiology Studies: I have reviewed all of the imaging during this hospital visit personally     Scheduled Meds: . vitamin C  500 mg Oral Daily  . bisacodyl  5 mg Oral Daily  . docusate sodium  100 mg Oral BID  . insulin aspart  0-20 Units Subcutaneous TID WC  . insulin detemir  14 Units Subcutaneous BID  . methylPREDNISolone  (SOLU-MEDROL) injection  60 mg Intravenous Daily  . metoprolol tartrate  25 mg Oral BID  . polyethylene glycol  17 g Oral Daily  . sodium chloride flush  3 mL Intravenous Q12H  . zinc sulfate  220 mg Oral Daily   Continuous Infusions: . sodium chloride    . sodium chloride    . ceFEPime (MAXIPIME) IV 2 g (11/26/19 0830)  . linezolid (ZYVOX) IV Stopped (11/26/19 0700)  . metronidazole 500 mg (11/26/19 0456)     LOS: 15 days        Coralynn Gaona Annett Gula, MD

## 2019-11-26 NOTE — Progress Notes (Signed)
This RN texted Dr Dwana Curd, as labs have returned with some concerning results, WBC, BUN, CREATININE, MAG, HEPARIN LEVELS, transcribed to DR.  This RN will attempt to set time for CT this am w/radiology as well.

## 2019-11-26 NOTE — Progress Notes (Signed)
NAME:  Nathaniel Webb, MRN:  [id number], DOB:  [birth year], LOS: 15 ADMISSION DATE:  11/11/2019, CONSULTATION DATE:  11/25/19 REFERRING MD:  Dr. Joseph Art, CHIEF COMPLAINT:  Pleural Effusion   Brief History   54 y/o M admitted 12/29 to [facility] with 1 week hx of worsening shortness of breath. Found to have acute hypoxemic respiratory in the setting of COVID pneumonitis. Hospital course notable for CTA chest 12/29 which showed he had diffuse bilateral airspace disease.  He had a spike in WBC on 1/10 and primary service performed a CT abdomen/pelvis which showed extensive airspace disease in the lower lungs compatible with PNA and small to moderate loculated right pleural effusion. Subsequent dedicated CT chest on 1/10 showed concern for multiloculated right hydropneumothorax. Anticoagulation was held and thoracentesis performed on 1/12 with pleural fluid concerning for empyema.  Simultaneously, his blood cultures were noted to have MSSA.    Past Medical History  DM II  HTN Morbid Obesity  Significant Hospital Events   12/29 Admit  1/12 PCCM consulted, thora performed 1/13 Tx to ICU with worsening pneumothorax   Consults:  PCCM   Procedures:    Significant Diagnostic Tests:  CTA Chest 12/29 >> positive for PE with small volume embolus in the distal right pulmonary artery, lobar, and some segmental branches CT ABD/Pelvis 1/10 >> moderate stool burden in colon, R>L airspace disease compatible with PNA, loculated right pleural effusion CT Chest w/o 1/10 >> multiloculated right lung hydropneumothorax, larger volume of pleural fluid relative to pleural air, progression of multilobar right lung consolidation, small volume loculated right pneumomediastinum Thora 1/12 >> glucose <20, LD 2,447, protein 5.2, total nucleated cells 117,299, neutrophil 85 CT Chest w/o 1/13 >> limited exam by motion, large right hydropneumothorax with numerous thin internal septations with air fluid levels increased in size,  complete right lung atelectasis, pneumomediastinum in the anterior right pericardial region, similar extensive patchy GGO on L  Micro Data:  BCID 1/10 >> MSSA BCx2 1/10 >> MSSA  Pleural fluid culture 1/12 >> mod WBC, rare GPC's in pairs >>  Antimicrobials:  Cefepime 1/10 >> Linezolid 1/10 >>   Interim history/subjective:  Pt transferred to ICU with increased work of breathing, concern for worsening pneumothorax.    Objective   Blood pressure 130/86, pulse 93, temperature 99.1 F (37.3 C), temperature source Axillary, resp. rate (!) 22, height 5\' 5"  (1.651 m), weight 91.3 kg, SpO2 99 %.    FiO2 (%):  [100 %] 100 %   Intake/Output Summary (Last 24 hours) at 11/26/2019 1437 Last data filed at 11/26/2019 1300 Gross per 24 hour  Intake 2033 ml  Output 2207 ml  Net -174 ml   Filed Weights   11/23/19 0421 11/25/19 0500 11/26/19 0500  Weight: 95.5 kg 94.8 kg 91.3 kg    Examination: General: adult male lying in bed on NRB +HFNC with increased work of breathing   HEENT: MM pink/moist, fair dentition  Neuro: AAOx4, speech clear  CV: s1s2 RRR, ST, no m/r/g PULM: abdominal accessory muscle use, diminished on right >L GI: soft, bsx4 active Extremities: warm/dry, no edema  Skin: no rashes or lesions  Resolved Hospital Problem list      Assessment & Plan:   Right Empyema / Presumed MSSA PNA  Hydropneumothorax COVID Pneumonitis  CT chest with densely consolidated right lung, air fluid level / hydropneumothorax. Pleural fluid sampled and concerning for empyema.   -now chest tube placement  -follow up CXR post placement  -continue abx as outlined -await  pleural cultures  -appreciate ID, TRH  Small Volume Acute PE -ok to resume heparin gtt post chest tube placement  -monitor chest tube drainage  MSSA Bacteremia  -per primary / ID  Best practice:  Diet: As tolerated  Pain/Anxiety/Delirium protocol (if indicated): n/a VAP protocol (if indicated): n/a DVT prophylaxis: per  primary  GI prophylaxis: n/a Glucose control: per primary  Mobility: as tolerated  Code Status: Full Code  Family Communication: per primary.  Patient updated on plan of care 1/13.  Disposition: per primary   Labs   CBC: Recent Labs  Lab 11/22/19 0408 11/23/19 0429 11/24/19 0555 11/25/19 1240 11/26/19 0326  WBC 49.2* 46.2* 35.8* 26.6* 26.9*  NEUTROABS 43.9* 40.8* 30.5* 23.2* 21.5*  HGB 15.1 14.3 13.4 12.3* 11.9*  HCT 46.4 43.8 40.1 37.6* 36.5*  MCV 81.4 80.1 79.2* 79.3* 81.3  PLT 220 217 185 208 200    Basic Metabolic Panel: Recent Labs  Lab 11/22/19 0408 11/23/19 0429 11/24/19 0555 11/25/19 0407 11/25/19 1240 11/26/19 0326  NA 132* 128* 130*  --  138 137  K 4.9 5.3* 5.2*  --  4.1 4.2  CL 91* 87* 92*  --  96* 94*  CO2 27 27 24   --  28 30  GLUCOSE 169* 179* 189*  --  205* 169*  BUN 33* 50* 63*  --  62* 56*  CREATININE 1.17 1.28* 1.31*  --  1.42* 1.46*  CALCIUM 8.4* 8.3* 8.2*  --  7.9* 7.8*  MG 3.5* 3.5* 4.2* 4.4*  --  4.2*  PHOS 4.4 4.7* 5.0* 5.7*  --  4.5   GFR: Estimated Creatinine Clearance: 61.4 mL/min (A) (by C-G formula based on SCr of 1.46 mg/dL (H)). Recent Labs  Lab 11/23/19 0429 11/24/19 0555 11/25/19 1240 11/26/19 0326  PROCALCITON 4.13 3.68  --   --   WBC 46.2* 35.8* 26.6* 26.9*    Liver Function Tests: Recent Labs  Lab 11/22/19 0408 11/23/19 0429 11/24/19 0555 11/25/19 1240 11/26/19 0326  AST 43* 29 36 36 49*  ALT 106* 68* 76* 88* 82*  ALKPHOS 115 133* 116 122 137*  BILITOT 0.8 1.0 1.0 0.8 0.9  PROT 7.0 6.9 6.8 7.1  7.1 7.1  ALBUMIN 3.5 3.0* 2.8* 2.5* 2.5*   No results for input(s): LIPASE, AMYLASE in the last 168 hours. No results for input(s): AMMONIA in the last 168 hours.  ABG    Component Value Date/Time   PHART 7.467 (H) 11/21/2019 1846   PCO2ART 32.0 11/21/2019 1846   PO2ART 128.0 (H) 11/21/2019 1846   HCO3 23.1 11/21/2019 1846   TCO2 24 11/21/2019 1846   ACIDBASEDEF 4.1 (H) 11/09/2019 2210   O2SAT 99.0 11/21/2019  1846     Coagulation Profile: Recent Labs  Lab 11/22/19 0408 11/23/19 0429 11/24/19 0555 11/25/19 0407 11/26/19 0326  INR 2.3* 1.7* 2.1* 1.3* 1.3*    Cardiac Enzymes: No results for input(s): CKTOTAL, CKMB, CKMBINDEX, TROPONINI in the last 168 hours.  HbA1C: Hgb A1c MFr Bld  Date/Time Value Ref Range Status  11/10/2019 07:55 AM 7.3 (H) 4.8 - 5.6 % Final    Comment:    (NOTE) Pre diabetes:          5.7%-6.4% Diabetes:              >6.4% Glycemic control for   <7.0% adults with diabetes   06/11/2019 11:17 AM 6.4 (H) 4.8 - 5.6 % Final    Comment:  Prediabetes: 5.7 - 6.4          Diabetes: >6.4          Glycemic control for adults with diabetes: <7.0     CBG: Recent Labs  Lab 11/25/19 1159 11/25/19 1625 11/25/19 2024 11/26/19 0751 11/26/19 1304  GLUCAP 211* 193* 193* 147* 398*    Critical care time: n/a    Noe Gens, MSN, NP-C [facility] 11/26/2019, 2:37 PM   Please see [email or web address] for pager details.

## 2019-11-26 NOTE — Progress Notes (Signed)
INFECTIOUS DISEASE PROGRESS NOTE  ID: Nathaniel Webb is a 54 y.o. male with  Principal Problem:   Acute respiratory failure with hypoxemia (HCC) Active Problems:   Type 2 diabetes, controlled, with peripheral neuropathy (HCC)   Hypertension   Morbid obesity (HCC)   Pneumonia due to COVID-19 virus   Diabetes mellitus type 2, uncontrolled, with complications (HCC)   Acute pulmonary embolus (HCC)   Demand ischemia (HCC)   Constipation   Loculated pleural effusion   MSSA bacteremia  Subjective: E-visit Noted worsening respiratory status, move to ICU  Abtx:  Anti-infectives (From admission, onward)   Start     Dose/Rate Route Frequency Ordered Stop   11/25/19 1800  ceFEPIme (MAXIPIME) 2 g in sodium chloride 0.9 % 100 mL IVPB     2 g 200 mL/hr over 30 Minutes Intravenous Every 8 hours 11/25/19 1706     11/25/19 1800  linezolid (ZYVOX) IVPB 600 mg     600 mg 300 mL/hr over 60 Minutes Intravenous Every 12 hours 11/25/19 1707     11/25/19 1800  metroNIDAZOLE (FLAGYL) IVPB 500 mg     500 mg 100 mL/hr over 60 Minutes Intravenous Every 8 hours 11/25/19 1707     11/24/19 2300  ceFAZolin (ANCEF) IVPB 2g/100 mL premix  Status:  Discontinued     2 g 200 mL/hr over 30 Minutes Intravenous Every 8 hours 11/24/19 2215 11/25/19 1706   11/23/19 1800  metroNIDAZOLE (FLAGYL) IVPB 500 mg  Status:  Discontinued     500 mg 100 mL/hr over 60 Minutes Intravenous Every 8 hours 11/23/19 1636 11/24/19 2215   11/23/19 1800  linezolid (ZYVOX) IVPB 600 mg  Status:  Discontinued     600 mg 300 mL/hr over 60 Minutes Intravenous Every 12 hours 11/23/19 1636 11/24/19 2215   11/23/19 1400  ceFEPIme (MAXIPIME) 2 g in sodium chloride 0.9 % 100 mL IVPB  Status:  Discontinued     2 g 200 mL/hr over 30 Minutes Intravenous Every 8 hours 11/23/19 1317 11/24/19 2215   11/12/19 1000  remdesivir 100 mg in sodium chloride 0.9 % 100 mL IVPB  Status:  Discontinued     100 mg 200 mL/hr over 30 Minutes Intravenous  Daily 11/11/19 1231 11/11/19 1238   11/12/19 1000  remdesivir 100 mg in sodium chloride 0.9 % 100 mL IVPB     100 mg 200 mL/hr over 30 Minutes Intravenous Daily 11/11/19 1239 11/14/19 0857   11/11/19 1330  cefTRIAXone (ROCEPHIN) 1 g in sodium chloride 0.9 % 100 mL IVPB     1 g 200 mL/hr over 30 Minutes Intravenous Every 24 hours 11/11/19 1315 11/15/19 1334   11/11/19 1330  azithromycin (ZITHROMAX) 500 mg in sodium chloride 0.9 % 250 mL IVPB     500 mg 250 mL/hr over 60 Minutes Intravenous Every 24 hours 11/11/19 1315 11/13/19 1623   11/11/19 1230  remdesivir 200 mg in sodium chloride 0.9% 250 mL IVPB  Status:  Discontinued     200 mg 580 mL/hr over 30 Minutes Intravenous Once 11/11/19 1231 11/11/19 1238      Medications:  Scheduled: . vitamin C  500 mg Oral Daily  . bisacodyl  5 mg Oral Daily  . Chlorhexidine Gluconate Cloth  6 each Topical Daily  . docusate sodium  100 mg Oral BID  . etomidate      . insulin aspart  0-20 Units Subcutaneous TID WC  . insulin detemir  14 Units Subcutaneous BID  .  methylPREDNISolone (SOLU-MEDROL) injection  60 mg Intravenous Daily  . metoprolol tartrate  25 mg Oral BID  . polyethylene glycol  17 g Oral Daily  . sodium chloride flush  3 mL Intravenous Q12H  . zinc sulfate  220 mg Oral Daily    Objective: Vital signs in last 24 hours: Temp:  [98.2 F (36.8 C)-99.1 F (37.3 C)] 99.1 F (37.3 C) (01/13 1250) Pulse Rate:  [87-155] 97 (01/13 1300) Resp:  [18-37] 29 (01/13 1300) BP: (100-138)/(80-96) 120/85 (01/13 1300) SpO2:  [82 %-99 %] 97 % (01/13 1300) FiO2 (%):  [100 %] 100 % (01/13 1300) Weight:  [91.3 kg] 91.3 kg (01/13 0500)   evisit  Lab Results Recent Labs    11/25/19 1240 11/26/19 0326  WBC 26.6* 26.9*  HGB 12.3* 11.9*  HCT 37.6* 36.5*  NA 138 137  K 4.1 4.2  CL 96* 94*  CO2 28 30  BUN 62* 56*  CREATININE 1.42* 1.46*   Liver Panel Recent Labs    11/25/19 1240 11/26/19 0326  PROT 7.1  7.1 7.1  ALBUMIN 2.5* 2.5*    AST 36 49*  ALT 88* 82*  ALKPHOS 122 137*  BILITOT 0.8 0.9   Sedimentation Rate No results for input(s): ESRSEDRATE in the last 72 hours. C-Reactive Protein Recent Labs    11/25/19 0407 11/26/19 0326  CRP 23.3* 28.4*    Microbiology: Recent Results (from the past 240 hour(s))  Culture, blood (routine x 2)     Status: Abnormal   Collection Time: 11/23/19  2:10 PM   Specimen: BLOOD  Result Value Ref Range Status   Specimen Description   Final    BLOOD LEFT ARM Performed at [facility], [address] [place], [place] [address]    Special Requests   Final    BOTTLES DRAWN AEROBIC ONLY Blood Culture adequate volume Performed at [facility], [address], [place], [place] [address]    Culture  Setup Time   Final    AEROBIC BOTTLE ONLY GRAM POSITIVE COCCI IN CLUSTERS CRITICAL RESULT CALLED TO, READ BACK BY AND VERIFIED WITH: E. WILLIAMSON, PHARMD (GVC) AT 1255 ON 11/24/19 BY C. JESSUP, MT.    Culture (A)  Final    STAPHYLOCOCCUS AUREUS SUSCEPTIBILITIES PERFORMED ON PREVIOUS CULTURE WITHIN THE LAST 5 DAYS. Performed at [facility], [address] [place], [place] [address]    Report Status 11/26/2019 FINAL  Final  Culture, blood (routine x 2)     Status: Abnormal   Collection Time: 11/23/19  2:15 PM   Specimen: BLOOD  Result Value Ref Range Status   Specimen Description   Final    BLOOD LEFT HAND Performed at [facility], [address], [place], [place] [address]    Special Requests   Final    BOTTLES DRAWN AEROBIC ONLY Blood Culture adequate volume Performed at [facility], [address], [place], [place] [address]    Culture  Setup Time   Final    GRAM POSITIVE COCCI IN CLUSTERS AEROBIC BOTTLE ONLY CRITICAL RESULT CALLED TO, READ BACK BY AND VERIFIED WITH: K AMEND [facility] 2202 11/24/19 A BROWNING Performed at [facility], [address], [place], [place] [address]     Culture STAPHYLOCOCCUS AUREUS (A)  Final   Report Status 11/26/2019 FINAL  Final   Organism ID, Bacteria STAPHYLOCOCCUS AUREUS  Final      Susceptibility   Staphylococcus aureus - MIC*  CIPROFLOXACIN <=0.5 SENSITIVE Sensitive     ERYTHROMYCIN >=8 RESISTANT Resistant     GENTAMICIN <=0.5 SENSITIVE Sensitive     OXACILLIN <=0.25 SENSITIVE Sensitive     TETRACYCLINE <=1 SENSITIVE Sensitive     VANCOMYCIN 1 SENSITIVE Sensitive     TRIMETH/SULFA <=10 SENSITIVE Sensitive     CLINDAMYCIN RESISTANT Resistant     RIFAMPIN <=0.5 SENSITIVE Sensitive     Inducible Clindamycin POSITIVE Resistant     * STAPHYLOCOCCUS AUREUS  Blood Culture ID Panel (Reflexed)     Status: Abnormal   Collection Time: 11/23/19  2:15 PM  Result Value Ref Range Status   Enterococcus species NOT DETECTED NOT DETECTED Final   Listeria monocytogenes NOT DETECTED NOT DETECTED Final   Staphylococcus species DETECTED (A) NOT DETECTED Final    Comment: CRITICAL RESULT CALLED TO, READ BACK BY AND VERIFIED WITH: K AMEND PHARMD 2202 11/24/19 A BROWNING    Staphylococcus aureus (BCID) DETECTED (A) NOT DETECTED Final    Comment: Methicillin (oxacillin) susceptible Staphylococcus aureus (MSSA). Preferred therapy is anti staphylococcal beta lactam antibiotic (Cefazolin or Nafcillin), unless clinically contraindicated. CRITICAL RESULT CALLED TO, READ BACK BY AND VERIFIED WITH: K AMEND PHARMD 2202 11/24/19 A BROWNING    Methicillin resistance NOT DETECTED NOT DETECTED Final   Streptococcus species NOT DETECTED NOT DETECTED Final   Streptococcus agalactiae NOT DETECTED NOT DETECTED Final   Streptococcus pneumoniae NOT DETECTED NOT DETECTED Final   Streptococcus pyogenes NOT DETECTED NOT DETECTED Final   Acinetobacter baumannii NOT DETECTED NOT DETECTED Final   Enterobacteriaceae species NOT DETECTED NOT DETECTED Final   Enterobacter cloacae complex NOT DETECTED NOT DETECTED Final   Escherichia coli NOT DETECTED NOT DETECTED Final     Klebsiella oxytoca NOT DETECTED NOT DETECTED Final   Klebsiella pneumoniae NOT DETECTED NOT DETECTED Final   Proteus species NOT DETECTED NOT DETECTED Final   Serratia marcescens NOT DETECTED NOT DETECTED Final   Haemophilus influenzae NOT DETECTED NOT DETECTED Final   Neisseria meningitidis NOT DETECTED NOT DETECTED Final   Pseudomonas aeruginosa NOT DETECTED NOT DETECTED Final   Candida albicans NOT DETECTED NOT DETECTED Final   Candida glabrata NOT DETECTED NOT DETECTED Final   Candida krusei NOT DETECTED NOT DETECTED Final   Candida parapsilosis NOT DETECTED NOT DETECTED Final   Candida tropicalis NOT DETECTED NOT DETECTED Final    Comment: Performed at [facility], [address] [place], [place] [address]  Body fluid culture     Status: None (Preliminary result)   Collection Time: 11/25/19 11:33 AM   Specimen: Pleura; Body Fluid  Result Value Ref Range Status   Specimen Description   Final    PLEURAL RT Performed at [facility], [address], [place], [place] [address]    Special Requests   Final    NONE Performed at [facility], [address] [place], [place] [address]    Gram Stain   Final    MODERATE WBC PRESENT, PREDOMINANTLY PMN RARE GRAM POSITIVE COCCI IN PAIRS    Culture   Final    CULTURE REINCUBATED FOR BETTER GROWTH Performed at [facility], [address], [place], [place] [address]    Report Status PENDING  Incomplete    Studies/Results: CT CHEST WO CONTRAST  Result Date: 11/26/2019 CLINICAL DATA:  Follow-up empyema.  COVID-19 pneumonia.  Invasion. EXAM: CT CHEST WITHOUT CONTRAST TECHNIQUE: Multidetector CT imaging of the chest was performed following the standard protocol without IV contrast.  COMPARISON:  11/23/2019 chest CT. Chest radiograph from earlier today. FINDINGS: Substantially motion degraded scan, limiting assessment. Cardiovascular: Normal heart size. No significant pericardial  effusion/thickening. Atherosclerotic nonaneurysmal thoracic aorta. Normal caliber pulmonary arteries. Mediastinum/Nodes: No discrete thyroid nodules. Unremarkable esophagus. No pathologically enlarged axillary, mediastinal or hilar lymph nodes, noting limited sensitivity for the detection of hilar adenopathy on this noncontrast study. Focus of mediastinal gas in upper anterior right pericardial region is mildly increased. Lungs/Pleura: Large right hydropneumothorax with numerous thin internal septations with air-fluid levels, overall increased in size since 11/23/2019 chest CT with worsening left mediastinal shift, predominantly due to worsening gas component. There is complete right lung atelectasis, worsened since 11/23/2019 chest CT. No left pneumothorax. No left pleural effusion. Extensive patchy ground-glass opacity and reticulation throughout the left lung appears similar. No discrete lung masses or significant pulmonary nodules. Upper abdomen: No acute abnormality. Musculoskeletal: No aggressive appearing focal osseous lesions. Mild to moderate gynecomastia, asymmetric to the left, unchanged. Mild thoracic spondylosis. IMPRESSION: 1. Limited substantially motion degraded scan. 2. Large right hydropneumothorax with numerous thin internal septations with air-fluid levels, overall increased in size since 11/23/2019 chest CT with worsening left mediastinal shift. 3. Complete right lung atelectasis, worsened since 11/23/2019 chest CT. 4. Pneumomediastinum in the anterior right pericardial region is increased. 5. Similar extensive patchy ground-glass opacity and reticulation in the left lung compatible with COVID-19 pneumonia. Aortic Atherosclerosis (ICD10-I70.0). These results were called by telephone at the time of interpretation on 11/26/2019 at 11:48 am to provider DR. Cyril Mourning, who verbally acknowledged these results. Electronically Signed   By: Delbert Phenix M.D.   On: 11/26/2019 11:48   DG CHEST PORT 1  VIEW  Result Date: 11/26/2019 CLINICAL DATA:  Chest tube.  COVID-19 infection. EXAM: PORTABLE CHEST 1 VIEW COMPARISON:  CT chest and chest radiograph same day. FINDINGS: Trachea is midline. Heart size is grossly stable. There is a small bore pigtail catheter at the base of the right hemithorax with incomplete re-expansion of the right lung. Residual moderate right hydropneumothorax. Diffuse bilateral airspace opacification persists. IMPRESSION: 1. Moderate residual right hydropneumothorax after right chest tube insertion. 2. Severe COVID-19 pneumonia. Electronically Signed   By: Leanna Battles M.D.   On: 11/26/2019 13:02   DG Chest Port 1 View  Result Date: 11/26/2019 CLINICAL DATA:  54 year old male with history of acute respiratory failure. COVID-19 infection. EXAM: PORTABLE CHEST 1 VIEW COMPARISON:  Chest x-ray 11/25/2019. FINDINGS: Patchy multifocal airspace throughout the left lung consolidation, most evident throughout the left mid to lower lung. Right-sided hydropneumothorax with near complete collapse of the right lung which appears as a mass-like opacity in the right perihilar region, similar to yesterday's examination. Pulmonary vasculature is obscured. Heart size is normal. Upper mediastinal contours are within normal limits. Aortic atherosclerosis. IMPRESSION: 1. Unchanged radiographic appearance the chest, as above. Electronically Signed   By: Trudie Reed M.D.   On: 11/26/2019 09:17   DG Chest Port 1 View  Result Date: 11/25/2019 CLINICAL DATA:  Pleural effusion EXAM: PORTABLE CHEST 1 VIEW COMPARISON:  Chest radiograph 11/25/2019 FINDINGS: No significant interval change as compared to chest radiograph performed earlier the same day. Redemonstrated multiloculated right hydropneumothorax with dominant right apical pneumothorax component measuring approximately 12.1 x 9.3 cm (previously 12.1 x 9.1 cm). Similar appearance of cystic changes within the right lung base. Redemonstrated  intervening opacity within the right mid lung likely reflecting atelectasis. Grossly unchanged leftward mediastinal shift. Unchanged diffuse coarsened interstitial and airspace opacities within the left lung, mid  to basilar predominant. Cardiomediastinal silhouette unchanged. Aortic atherosclerosis. No acute bony abnormality. Overlying cardiac monitoring leads. IMPRESSION: 1. No significant change as compared to chest radiograph performed earlier the same day. 2. Loculated right hydropneumothorax with dominant right apical pneumothorax component. Leftward mediastinal shift. 3. Extensive cystic changes within the right lower lung with right mid lung atelectasis. 4. Diffuse coarsened interstitial and airspace opacities within the left lung. Electronically Signed   By: Kellie Simmering DO   On: 11/25/2019 16:56   DG CHEST PORT 1 VIEW  Result Date: 11/25/2019 CLINICAL DATA:  Status post thoracentesis. EXAM: PORTABLE CHEST 1 VIEW COMPARISON:  CT chest 11/23/2019 FINDINGS: There is progressive shift of the mediastinum into the left hemithorax. Mild diffuse coarsened interstitial and airspace opacities are again noted within the left lung. No left pleural effusion identified. Status post right thoracentesis. There is a loculated pneumothorax within the right upper lobe which measures approximately 12.1 x 9.1 cm. Within the right lung base there are extensive cystic changes. Within the right midlung there is intervening area of increased opacification likely reflecting atelectatic lung. IMPRESSION: 1. Status post right thoracentesis with loculated pneumothorax within the right upper lobe. 2. Diffuse coarsened interstitial and airspace opacities within the left lung. 3. Extensive cystic changes within the right lower lung with right mid lung atelectatic change. Electronically Signed   By: Kerby Moors M.D.   On: 11/25/2019 12:16     Assessment/Plan: Staph bacteremia (MSSA) 2/4 PE Empyema COVID Obesity DM2,  uncontrolled (A1C 7.3%) AKI Total days of antibiotics: 3 zyvox/cefepime/flagyl         CT scan reviewed Will defer to Dr Elsworth Soho re: need for chest tube.  Pleural fluid Cx pending Repeat BCx 1-12 pending.   Bobby Rumpf MD, FACP Infectious Diseases (pager) [phone] [email or web address] 11/26/2019, 1:45 PM  LOS: 15 days

## 2019-11-26 NOTE — Progress Notes (Signed)
ANTICOAGULATION CONSULT NOTE - Follow Up Consult  Pharmacy Consult for Heparin Indication: PE  No Known Allergies  Patient Measurements: Height: 5\' 5"  (165.1 cm) Weight: 201 lb 4.5 oz (91.3 kg) IBW/kg (Calculated) : 61.5 Heparin Dosing Weight: 85 kg  Vital Signs: Temp: 99.5 F (37.5 C) (01/13 2000) Temp Source: Axillary (01/13 2000) BP: 103/75 (01/13 2300) Pulse Rate: 78 (01/13 2300)  Labs: Recent Labs    11/24/19 0555 11/25/19 0407 11/25/19 1240 11/25/19 1812 11/26/19 0326 11/26/19 2134  HGB 13.4  --  12.3*  --  11.9*  --   HCT 40.1  --  37.6*  --  36.5*  --   PLT 185  --  208  --  200  --   APTT  --   --   --  32  --  69*  LABPROT 23.7* 16.5*  --   --  15.9*  --   INR 2.1* 1.3*  --   --  1.3*  --   HEPARINUNFRC  --   --   --  0.78* 0.49 0.49  CREATININE 1.31*  --  1.42*  --  1.46*  --     Estimated Creatinine Clearance: 61.4 mL/min (A) (by C-G formula based on SCr of 1.46 mg/dL (H)).   Assessment: 54 yo male with COVID pneumonitis started on Eliquis for PE, held yesterday for thoracentesis today for evaluation of pleural effusion.  To start IV heparin today with plans to resume Eliquis in 24-48hr if no bleeding.  Most recent dose of Eliquis 5mg  on 1/11 at 08:14 Heparin Level 0.49 with AM labs (no heparin started overnight).  Level is falsely elevated d/t recent Eliquis.  Will use APTT for dosing changes until both levels correlate. SCr 1.4 CBC: Hgb low/stable at 11.9, Plt 200 Chest tube placed 1/13 without noted complications.    11/26/19 2330 UPDATE aPTT: 69 seconds, within desired goal range Heparin level: 0.49 IU/mL--> still elevate d/t recent DOAC use RN states no issues with bleeding or infusion site.   Goal of Therapy:  Heparin level 0.3-0.7 units/ml aPTT 66-102 seconds  (OK to use full goal) Monitor platelets by anticoagulation protocol: Yes   Plan:   Continue heparin   infusion  rate at 1400 units/hr  Confirm with daily APTT, heparin level, and  CBC in am Continue to monitor H&H and platelets  11/28/19, Pharm. D. Clinical Pharmacist 11/26/2019 11:33 PM

## 2019-11-26 NOTE — Progress Notes (Signed)
Late Entry For 11/25/19:  1120 Time out for Right Thoracentesis with Dr. Vassie Loll & Canary Brim NP, present for procedure was Delories Mauri T RN., Gershon Mussel RN, and Claudia RN  Patient laying on right side per Dr. Vassie Loll instructions after thoracentesis.   1216 CXR results IMPRESSION: 1. Status post right thoracentesis with loculated pneumothorax within the right upper lobe. 2. Diffuse coarsened interstitial and airspace opacities within the left lung. 3. Extensive cystic changes within the right lower lung with right mid lung atelectatic change. FINDINGS: There is progressive shift of the mediastinum into the left Hemithorax. Patient laying on right side per Dr. Vassie Loll instructions after xray.  1332 I notified Dr. Vassie Loll "CXR showed loculated pneumothorax of Right Upper lobe with progressive mediastinal shift to Left. Is the plan still to wait for another CXR at 1600?"  1341 Dr. Vassie Loll responded "Yes- apical pneunothorax was present on CT _ just inform us if his oxygen requirements increase or if he does worse:"  1635 Dr. Vassie Loll wanted to know "how does he look ? O2 needs same ?" 1636 I notified that pt look the same to me.  1742 Jeanella Craze, NP ordered Ct Chest WO Contrast as routine.  1805 This nurse acknowledge order [id number]  Paged/chat Canary Brim NP "quick question why CT scan? also Dr. Joseph Art ordered Heparin gtt is that ok?" No response from Canary Brim NP so I paged/chat Dr. Vassie Loll.   1827 asked Dr. Vassie Loll at [phone] "quick question why CT scan? also Dr. Joseph Art ordered Heparin gtt is that ok?".    1834 Dr. Vassie Loll responded-"not able to tell on CXR -pneumo vs bulla vs residual loculated empyema fluid . If CT shows more loculations present, then may need CT guidede chest tube at [facility]. If worse pneumo, then we can place chest tube here"  1835 I notified Dr. Vassie Loll that it "will be after shift change before we can get to CT, that ok?"  1841 Dr. Vassie Loll responded "no problem - no hurry - we will only  act on it tmoro am"  Patient maintained on HFNC and non- rebreather all day with oxygen sats 90-100%. During shift report to night shift RN Charlene notified that CT Chest was still needed.

## 2019-11-26 NOTE — Progress Notes (Signed)
Spoke to lab tech this am, questioned why no heparin results as of yet. She examined her blood draw requests at her blood cart, and discovered she had missed the 0001 blood draw orders that Dr Dwana Curd placed. She stated she would perform right away. This RN notified Dr Dwana Curd at [phone], as there are still no results for the unfractionated Heparin level this am. Gtt has been on hold, as per Dr Dwana Curd.

## 2019-11-27 ENCOUNTER — Inpatient Hospital Stay (HOSPITAL_COMMUNITY): Payer: Medicaid Other

## 2019-11-27 DIAGNOSIS — J80 Acute respiratory distress syndrome: Secondary | ICD-10-CM

## 2019-11-27 DIAGNOSIS — I2602 Saddle embolus of pulmonary artery with acute cor pulmonale: Secondary | ICD-10-CM

## 2019-11-27 LAB — COMPREHENSIVE METABOLIC PANEL
ALT: 113 U/L — ABNORMAL HIGH (ref 0–44)
AST: 59 U/L — ABNORMAL HIGH (ref 15–41)
Albumin: 2.3 g/dL — ABNORMAL LOW (ref 3.5–5.0)
Alkaline Phosphatase: 143 U/L — ABNORMAL HIGH (ref 38–126)
Anion gap: 11 (ref 5–15)
BUN: 45 mg/dL — ABNORMAL HIGH (ref 6–20)
CO2: 29 mmol/L (ref 22–32)
Calcium: 7.7 mg/dL — ABNORMAL LOW (ref 8.9–10.3)
Chloride: 99 mmol/L (ref 98–111)
Creatinine, Ser: 1.42 mg/dL — ABNORMAL HIGH (ref 0.61–1.24)
GFR calc Af Amer: >60 mL/min (ref >60)
GFR calc non Af Amer: 56 mL/min — ABNORMAL LOW (ref >60)
Glucose, Bld: 93 mg/dL (ref 70–99)
Potassium: 4.1 mmol/L (ref 3.5–5.1)
Sodium: 139 mmol/L (ref 135–145)
Total Bilirubin: 0.4 mg/dL (ref 0.3–1.2)
Total Protein: 6.7 g/dL (ref 6.5–8.1)

## 2019-11-27 LAB — CBC WITH DIFFERENTIAL/PLATELET
Abs Immature Granulocytes: 0.6 10*3/uL — ABNORMAL HIGH (ref 0.00–0.07)
Basophils Absolute: 0.1 10*3/uL (ref 0.0–0.1)
Basophils Relative: 0 %
Eosinophils Absolute: 0.1 10*3/uL (ref 0.0–0.5)
Eosinophils Relative: 0 %
HCT: 33.8 % — ABNORMAL LOW (ref 39.0–52.0)
Hemoglobin: 10.9 g/dL — ABNORMAL LOW (ref 13.0–17.0)
Immature Granulocytes: 3 %
Lymphocytes Relative: 11 %
Lymphs Abs: 2.3 10*3/uL (ref 0.7–4.0)
MCH: 26.5 pg (ref 26.0–34.0)
MCHC: 32.2 g/dL (ref 30.0–36.0)
MCV: 82 fL (ref 80.0–100.0)
Monocytes Absolute: 1.7 10*3/uL — ABNORMAL HIGH (ref 0.1–1.0)
Monocytes Relative: 9 %
Neutro Abs: 15.4 10*3/uL — ABNORMAL HIGH (ref 1.7–7.7)
Neutrophils Relative %: 77 %
Platelets: 191 10*3/uL (ref 150–400)
RBC: 4.12 MIL/uL — ABNORMAL LOW (ref 4.22–5.81)
RDW: 15.9 % — ABNORMAL HIGH (ref 11.5–15.5)
WBC: 20.1 10*3/uL — ABNORMAL HIGH (ref 4.0–10.5)
nRBC: 0.1 % (ref 0.0–0.2)

## 2019-11-27 LAB — APTT: aPTT: 55 seconds — ABNORMAL HIGH (ref 24–36)

## 2019-11-27 LAB — HEPARIN LEVEL (UNFRACTIONATED): Heparin Unfractionated: 0.51 IU/mL (ref 0.30–0.70)

## 2019-11-27 LAB — GLUCOSE, CAPILLARY
Glucose-Capillary: 84 mg/dL (ref 70–99)
Glucose-Capillary: 362 mg/dL — ABNORMAL HIGH (ref 70–99)
Glucose-Capillary: 355 mg/dL — ABNORMAL HIGH (ref 70–99)
Glucose-Capillary: 194 mg/dL — ABNORMAL HIGH (ref 70–99)
Glucose-Capillary: 136 mg/dL — ABNORMAL HIGH (ref 70–99)

## 2019-11-27 LAB — D-DIMER, QUANTITATIVE: D-Dimer, Quant: 2.24 ug/mL-FEU — ABNORMAL HIGH (ref 0.00–0.50)

## 2019-11-27 LAB — FERRITIN: Ferritin: 2001 ng/mL — ABNORMAL HIGH (ref 24–336)

## 2019-11-27 LAB — C-REACTIVE PROTEIN: CRP: 20.9 mg/dL — ABNORMAL HIGH (ref <1.0)

## 2019-11-27 MED ORDER — MORPHINE SULFATE (PF) 2 MG/ML IV SOLN: 1.0000 mg | INTRAVENOUS | Status: DC | PRN

## 2019-11-27 MED ORDER — INSULIN DETEMIR 100 UNIT/ML ~~LOC~~ SOLN
10.0000 [IU] | Freq: Two times a day (BID) | SUBCUTANEOUS | Status: DC
  Administered 2019-11-27 – 2019-11-29 (×4): 10 [IU] via SUBCUTANEOUS
  Filled 2019-11-27 (×5): qty 0.1

## 2019-11-27 MED ORDER — CEFAZOLIN SODIUM-DEXTROSE 2-4 GM/100ML-% IV SOLN
2.0000 g | Freq: Three times a day (TID) | INTRAVENOUS | Status: DC
  Administered 2019-11-27 – 2019-12-06 (×27): 2 g via INTRAVENOUS
  Filled 2019-11-27 (×36): qty 100

## 2019-11-27 MED ORDER — METOPROLOL TARTRATE 25 MG PO TABS
25.0000 mg | ORAL_TABLET | Freq: Once | ORAL | Status: AC
  Administered 2019-11-27: 25 mg via ORAL
  Filled 2019-11-27: qty 1

## 2019-11-27 MED ORDER — RESOURCE THICKENUP CLEAR PO POWD
ORAL | Status: DC | PRN
  Filled 2019-11-27: qty 125

## 2019-11-27 MED ORDER — METOPROLOL TARTRATE 25 MG PO TABS
50.0000 mg | ORAL_TABLET | Freq: Two times a day (BID) | ORAL | Status: DC
  Administered 2019-11-27 – 2019-11-29 (×5): 50 mg via ORAL
  Filled 2019-11-27 (×6): qty 2

## 2019-11-27 MED ORDER — POLYVINYL ALCOHOL 1.4 % OP SOLN
1.0000 [drp] | Freq: Four times a day (QID) | OPHTHALMIC | Status: AC | PRN
  Administered 2019-11-27 – 2019-12-02 (×3): 1 [drp] via OPHTHALMIC
  Filled 2019-11-27: qty 15

## 2019-11-27 MED ORDER — HYDROCOD POLST-CPM POLST ER 10-8 MG/5ML PO SUER
5.0000 mL | Freq: Two times a day (BID) | ORAL | Status: DC
  Administered 2019-11-27 – 2019-12-17 (×38): 5 mL via ORAL
  Filled 2019-11-27 (×38): qty 5

## 2019-11-27 NOTE — Progress Notes (Signed)
Pharmacy Antibiotic Note  Nathaniel Webb is a 54 y.o. male admitted on 11/11/2019 with COVID-19 pneumonia.  Pharmacy has been consulted for Cefazolin dosing for MSSA bacteremia, loculated pleural effusion, empyema.  Today is day #5 broad spectrum antibiotics.  Narrowing today.  SCr remains elevated at 1.4, Crcl ~ 64 ml/min.  WBC decreasing and Afebrile.  Plan: Cefazolin 2g IV q8h Follow up renal function, culture results, and clinical course.   Height: 5\' 5"  (165.1 cm) Weight: 210 lb 8.6 oz (95.5 kg) IBW/kg (Calculated) : 61.5  Temp (24hrs), Avg:99 F (37.2 C), Min:98.3 F (36.8 C), Max:99.5 F (37.5 C)  Recent Labs  Lab 11/23/19 0429 11/24/19 0555 11/25/19 1240 11/26/19 0326 11/27/19 0500  WBC 46.2* 35.8* 26.6* 26.9* 20.1*  CREATININE 1.28* 1.31* 1.42* 1.46* 1.42*    Estimated Creatinine Clearance: 64.6 mL/min (A) (by C-G formula based on SCr of 1.42 mg/dL (H)).    No Known Allergies  Antimicrobials this admission: 12/28 Actemra 12/28 remdesivir >> 1/1 12/28 azithromycin >> 1/1 12/28 CTX >> 1/2 1/10 cefepime>>1/11, resume 1/12 >> 1/14 1/10 zyvox>>1/11, resume 1/12 >> 1/14 1/10 flagyl>>1/11, resume 1/12 >>1/14 1/11 Cefazolin>>1/12, resume 1/14 >>   Microbiology results: 12/27 BCx: ngf 12/28 MRSA PCR: neg 12/28 BCx: ngf 1/10 BCx: MSSA 1/12 BCx: ngtd 1/12: pleural body fluid: moderate Staph aureus  Thank you for allowing pharmacy to be a part of this patient's care.  3/12 PharmD, BCPS Clinical pharmacist phone 7am- 5pm: [phone] 11/27/2019 11:03 AM

## 2019-11-27 NOTE — Progress Notes (Signed)
ANTICOAGULATION CONSULT NOTE - Follow Up Consult  Pharmacy Consult for Heparin Indication: PE  No Known Allergies  Patient Measurements: Height: 5\' 5"  (165.1 cm) Weight: 210 lb 8.6 oz (95.5 kg) IBW/kg (Calculated) : 61.5 Heparin Dosing Weight: 85 kg  Vital Signs: Temp: 98.6 F (37 C) (01/14 0400) Temp Source: Axillary (01/14 0400) BP: 118/67 (01/14 0800) Pulse Rate: 68 (01/14 0800)  Labs: Recent Labs     0000 11/25/19 0407 11/25/19 1240 11/25/19 1812 11/25/19 1812 11/26/19 0326 11/26/19 2134 11/27/19 0500  HGB   < >  --  12.3*  --   --  11.9*  --  10.9*  HCT  --   --  37.6*  --   --  36.5*  --  33.8*  PLT  --   --  208  --   --  200  --  191  APTT  --   --   --  32  --   --  69* 55*  LABPROT  --  16.5*  --   --   --  15.9*  --   --   INR  --  1.3*  --   --   --  1.3*  --   --   HEPARINUNFRC  --   --   --  0.78*   < > 0.49 0.49 0.51  CREATININE  --   --  1.42*  --   --  1.46*  --  1.42*   < > = values in this interval not displayed.    Estimated Creatinine Clearance: 64.6 mL/min (A) (by C-G formula based on SCr of 1.42 mg/dL (H)).   Medications:  Scheduled:  . vitamin C  500 mg Oral Daily  . bisacodyl  5 mg Oral Daily  . Chlorhexidine Gluconate Cloth  6 each Topical Daily  . docusate sodium  100 mg Oral BID  . insulin aspart  0-20 Units Subcutaneous TID WC  . insulin detemir  20 Units Subcutaneous BID  . methylPREDNISolone (SOLU-MEDROL) injection  30 mg Intravenous Daily  . metoprolol tartrate  25 mg Oral BID  . polyethylene glycol  17 g Oral Daily  . sodium chloride flush  3 mL Intravenous Q12H  . zinc sulfate  220 mg Oral Daily   Infusions:  . sodium chloride    . sodium chloride    . ceFEPime (MAXIPIME) IV 2 g (11/27/19 0230)  . heparin 1,400 Units/hr (11/27/19 0800)  . linezolid (ZYVOX) IV 600 mg (11/26/19 2250)  . metronidazole 500 mg (11/27/19 0535)    Assessment: 54 yo male with COVID pneumonitis started on Eliquis for PE, held for  thoracentesis 1/12 and chest tube placement on 1/13.  Pharmacy is consulted to start IV heparin after the chest tube was placed..    Most recent dose of Eliquis 5mg  on 1/11 at 08:14 Heparin Level 0.51, therapeutic on heparin at 1400 units/hr.  Last apixaban ~ 72 hours ago, and correlating with aPTT.  Will use HL for dosing changes. SCr 1.4 CBC: Hgb low/stable at 10.9, Plt 191 Chest tube placed 1/13 without noted complications.    Goal of Therapy:  Heparin level 0.3-0.7 units/ml  Monitor platelets by anticoagulation protocol: Yes   Plan:   Continue heparin IV infusion at 1400 units/hr  Daily APTT, heparin level, and CBC  Continue to monitor H&H and platelets  Follow up plans to resume apixaban when stable.   3/11 PharmD, BCPS Clinical pharmacist phone 7am- 5pm: [phone] 11/27/2019 8:46  AM

## 2019-11-27 NOTE — Progress Notes (Signed)
Inpatient Diabetes Program Recommendations  AACE/ADA: New Consensus Statement on Inpatient Glycemic Control (2015)  Target Ranges:  Prepandial:   less than 140 mg/dL      Peak postprandial:   less than 180 mg/dL (1-2 hours)      Critically ill patients:  140 - 180 mg/dL   Lab Results  Component Value Date   GLUCAP 355 (H) 11/27/2019   HGBA1C 7.3 (H) 11/10/2019    Review of Glycemic Control Results for Nathaniel Webb, Nathaniel Webb (MRN [id number]) as of 11/27/2019 13:30  Ref. Range 11/26/2019 21:08 11/27/2019 07:47 11/27/2019 11:25 11/27/2019 12:10  Glucose-Capillary Latest Ref Range: 70 - 99 mg/dL 937 (H) 84 902 (H) 409 (H)   Diabetes history: DM 2 Outpatient Diabetes medications:  Novolog 0-9 units q 4 hours, Metformin 500 mg daily Current orders for Inpatient glycemic control:  Novolog resistant tid with meals Lantus 20 units bid Solumedrol 30 mg daily Inpatient Diabetes Program Recommendations:    Consider adding Novolog 5 units TID (assuming patient is consuming >50% of meals).   Thanks, Lujean Rave, MSN, RNC-OB Diabetes Coordinator [phone] (8a-5p)

## 2019-11-27 NOTE — Progress Notes (Signed)
NAME:  Nathaniel Webb, MRN:  [id number], DOB:  19-Mar-1966, LOS: 9 ADMISSION DATE:  11/11/2019, CONSULTATION DATE:  1/12 REFERRING MD:  Sherral Hammers, CHIEF COMPLAINT:  Pleural effusion   Brief History   54 y/o male admitted with COVID pneumonia on 12/29, developed loculated pleural effusion by 1/10 with associated hydropneumothorax.    Past Medical History  DM II  HTN Morbid Obesity  Significant Hospital Events   12/29 Admit  1/12 PCCM consulted, thora performed 1/13 Tx to ICU with worsening pneumothorax   Consults:  PCCM  Procedures:  1/13 chest tube R side >   Significant Diagnostic Tests:  CTA Chest 12/29 >> positive for PE with small volume embolus in the distal right pulmonary artery, lobar, and some segmental branches CT ABD/Pelvis 1/10 >> moderate stool burden in colon, R>L airspace disease compatible with PNA, loculated right pleural effusion CT Chest w/o 1/10 >> multiloculated right lung hydropneumothorax, larger volume of pleural fluid relative to pleural air, progression of multilobar right lung consolidation, small volume loculated right pneumomediastinum Thora 1/12 >> glucose <20, LD 2,447, protein 5.2, total nucleated cells 117,299, neutrophil 85 CT Chest w/o 1/13 >> limited exam by motion, large right hydropneumothorax with numerous thin internal septations with air fluid levels increased in size, complete right lung atelectasis, pneumomediastinum in the anterior right pericardial region, similar extensive patchy GGO on L  Micro Data:  BCID 1/10 >> MSSA BCx2 1/10 >> MSSA  Pleural fluid culture 1/12 >> moderate staph aureus  Antimicrobials/COVID Rx  Remdesivir 12/27 > 1/1 Tocilizumab 12/28 >  Cefepime 1/10 >>1/14 Linezolid 1/10 >> 1/14 Cefazolin 1/14 >>  Interim history/subjective:  Remains on 15L [place], NRB Breathing is OK  Objective   Blood pressure 118/67, pulse 68, temperature 98.6 F (37 C), temperature source Axillary, resp. rate 16, height 5\' 5"   (1.651 m), weight 95.5 kg, SpO2 100 %.    FiO2 (%):  [100 %] 100 %   Intake/Output Summary (Last 24 hours) at 11/27/2019 0839 Last data filed at 11/27/2019 0800 Gross per 24 hour  Intake 1411.51 ml  Output 1895 ml  Net -483.49 ml   Filed Weights   11/25/19 0500 11/26/19 0500 11/27/19 0500  Weight: 94.8 kg 91.3 kg 95.5 kg    Examination:  General:  Resting comfortably in bed HENT: NCAT OP clear PULM: Coarse breath sounds, normal effort CV: RRR, no mgr GI: BS+, soft, nontender MSK: normal bulk and tone Neuro: awake, alert, no distress, MAEW  1/14 CXR images personally reviewed> small pneumothorax R base, chest tube in place  Resolved Hospital Problem list    Assessment & Plan:  R Empyema MSSA pneumonia  Hydropneumothorax COVID pneumonitis Continue chest tube to suction Write orderset for pigtail chest tube management including saline flushes  Severe acute respiratory failure with hypoxemia due to persistent effect of COVID scarring and MSSA pneumonia Continue O2 to maintain O2 saturation > 85%  COVID pneumonia Completed remdesivir and solumedrol  Small volume acute PE Heparin infusion  MSSA bacteremia and empyema  Cefepime, linezolid to stop today Start cefazolin   Best practice:   Per TRH    Labs   CBC: Recent Labs  Lab 11/23/19 0429 11/24/19 0555 11/25/19 1240 11/26/19 0326 11/27/19 0500  WBC 46.2* 35.8* 26.6* 26.9* 20.1*  NEUTROABS 40.8* 30.5* 23.2* 21.5* 15.4*  HGB 14.3 13.4 12.3* 11.9* 10.9*  HCT 43.8 40.1 37.6* 36.5* 33.8*  MCV 80.1 79.2* 79.3* 81.3 82.0  PLT 217 185 208 200 191    Basic  Metabolic Panel: Recent Labs  Lab 11/22/19 0408 11/22/19 0408 11/23/19 0429 11/24/19 0555 11/25/19 0407 11/25/19 1240 11/26/19 0326 11/27/19 0500  NA 132*   < > 128* 130*  --  138 137 139  K 4.9   < > 5.3* 5.2*  --  4.1 4.2 4.1  CL 91*   < > 87* 92*  --  96* 94* 99  CO2 27   < > 27 24  --  28 30 29   GLUCOSE 169*   < > 179* 189*  --  205* 169*  93  BUN 33*   < > 50* 63*  --  62* 56* 45*  CREATININE 1.17   < > 1.28* 1.31*  --  1.42* 1.46* 1.42*  CALCIUM 8.4*   < > 8.3* 8.2*  --  7.9* 7.8* 7.7*  MG 3.5*  --  3.5* 4.2* 4.4*  --  4.2*  --   PHOS 4.4  --  4.7* 5.0* 5.7*  --  4.5  --    < > = values in this interval not displayed.   GFR: Estimated Creatinine Clearance: 64.6 mL/min (A) (by C-G formula based on SCr of 1.42 mg/dL (H)). Recent Labs  Lab 11/23/19 0429 11/23/19 0429 11/24/19 0555 11/25/19 1240 11/26/19 0326 11/27/19 0500  PROCALCITON 4.13  --  3.68  --   --   --   WBC 46.2*   < > 35.8* 26.6* 26.9* 20.1*   < > = values in this interval not displayed.    Liver Function Tests: Recent Labs  Lab 11/23/19 0429 11/24/19 0555 11/25/19 1240 11/26/19 0326 11/27/19 0500  AST 29 36 36 49* 59*  ALT 68* 76* 88* 82* 113*  ALKPHOS 133* 116 122 137* 143*  BILITOT 1.0 1.0 0.8 0.9 0.4  PROT 6.9 6.8 7.1  7.1 7.1 6.7  ALBUMIN 3.0* 2.8* 2.5* 2.5* 2.3*   No results for input(s): LIPASE, AMYLASE in the last 168 hours. No results for input(s): AMMONIA in the last 168 hours.  ABG    Component Value Date/Time   PHART 7.467 (H) 11/21/2019 1846   PCO2ART 32.0 11/21/2019 1846   PO2ART 128.0 (H) 11/21/2019 1846   HCO3 23.1 11/21/2019 1846   TCO2 24 11/21/2019 1846   ACIDBASEDEF 4.1 (H) 11/09/2019 2210   O2SAT 99.0 11/21/2019 1846     Coagulation Profile: Recent Labs  Lab 11/22/19 0408 11/23/19 0429 11/24/19 0555 11/25/19 0407 11/26/19 0326  INR 2.3* 1.7* 2.1* 1.3* 1.3*    Cardiac Enzymes: No results for input(s): CKTOTAL, CKMB, CKMBINDEX, TROPONINI in the last 168 hours.  HbA1C: Hgb A1c MFr Bld  Date/Time Value Ref Range Status  11/10/2019 07:55 AM 7.3 (H) 4.8 - 5.6 % Final    Comment:    (NOTE) Pre diabetes:          5.7%-6.4% Diabetes:              >6.4% Glycemic control for   <7.0% adults with diabetes   06/11/2019 11:17 AM 6.4 (H) 4.8 - 5.6 % Final    Comment:             Prediabetes: 5.7 - 6.4           Diabetes: >6.4          Glycemic control for adults with diabetes: <7.0     CBG: Recent Labs  Lab 11/26/19 0751 11/26/19 1304 11/26/19 1652 11/26/19 2108 11/27/19 0747  GLUCAP 147* 398* 355* 249* 84  Critical care time: n/a     Roselie Awkward, MD [facility] Pager: [phone] Cell: [phone] If no response, call [phone]

## 2019-11-27 NOTE — Progress Notes (Addendum)
1018: MD Arrien notified per telemetry, patient had run of SVT and self converted. No further interventions at this time. Will continue to monitor.  1100: MD Arrien paged. Per telemetry, patient in SVT HR 190s. Patient given 5mg  of metoprolol IV HR 133 at this time. Pt in afib. Awaiting return call.  1112: MD Arrien paged again. Patient back in NSR 90s  1120: Awaiting return call from page. MD Arrien sent secure chat message notifying patient wen into SVT and was given 5mg  of metoprolol. Notified patients HR currently in 80s. Awaiting for response. Will continue to monitor patient.  1129: New orders placed by MD Arrien. See orders.

## 2019-11-27 NOTE — Progress Notes (Signed)
  Speech Language Pathology Treatment: Dysphagia  Patient Details Name: Nathaniel Webb MRN: [id number] DOB: February 13, 1966 Today's Date: 11/27/2019 Time: 1660-6301 SLP Time Calculation (min) (ACUTE ONLY): 13 min  Assessment / Plan / Recommendation Clinical Impression  Pt has been refusing prethickened liquids, only wants water. RN reports tolerating nectar but pt displeased requesting thin. SLP observed pt consume 4 oz of thin liquids via straw with consecutive sips x2 with no immediate coughing. Pt on 15L HFNC, and 15 L NRB mask, but removed the mask and consumed liquids without struggle or change in vitals. We discussed how holding your breath to drink that long could be hard when you are struggling to breathe, Reasoned with pt about why taking one sip at a time would be a good precaution. Refilled cup and stayed by pts bedside as he demonstrated more cautious, slow intake. Pt verbalized understanding and reported he had been so thirsty but if he had access to water at the bedside he could easily slow his rate. Given that there were no signs of aspiration with consecutive intake and pts work of breathing has improved with medical management, recommend upgrade to thin with aspiration precautions. RN aware of change. Will f/u for tolerance.     HPI HPI: 54 y.o. male with PMHx diabetes type 2 uncontrolled with complication/diabetic neuropathy, HTN, HLD, obesity presented to [facility] 12/29 with acute hypoxic respiratory failure, requiring HFNC to maintain 02 saturations. Dx'd with COVID-19 on 12/23.    1/10 CXR showed ARDS bilateral increasing opacification; respiratory status worsened; concerns for aspiration. Acute PE; loculated right pleural effusion; 1/11 CTS consult, recs chest tube. 1/12 CT chest with densely consolidated right lung, air fluid level/ hydropneumothorax. Thoracentesis 1/12; pleural fluid sampled and concerning for empyema.  1/13 emergently transferred to ICU for worsening hypoxia; CT showed  worsening right hydropneumothorax with loculations in pleura. Emergent right chest tube placed.       SLP Plan  Continue with current plan of care       Recommendations  Diet recommendations: Thin liquid Liquids provided via: Cup;Straw Medication Administration: Whole meds with puree Supervision: Patient able to self feed Compensations: Slow rate;Small sips/bites Postural Changes and/or Swallow Maneuvers: Seated upright 90 degrees                Oral Care Recommendations: Oral care BID Follow up Recommendations: None SLP Visit Diagnosis: Dysphagia, unspecified (R13.10) Plan: Continue with current plan of care       GO               Harlon Ditty, MA CCC-SLP  Acute Rehabilitation Services Pager [phone] Office [phone]  Claudine Mouton 11/27/2019, 2:57 PM

## 2019-11-27 NOTE — Progress Notes (Signed)
Spoke with the patient's wife loretta. Updated her on the patient's status over night. She did not have any questions.

## 2019-11-27 NOTE — Progress Notes (Signed)
INFECTIOUS DISEASE PROGRESS NOTE  ID: Nathaniel Webb is a 54 y.o. male with  Principal Problem:   Acute respiratory failure with hypoxemia ([place]) Active Problems:   Type 2 diabetes, controlled, with peripheral neuropathy ([place])   Hypertension   Morbid obesity ([place])   Pneumonia due to COVID-19 virus   Diabetes mellitus type 2, uncontrolled, with complications ([place])   Acute pulmonary embolus ([place])   Demand ischemia ([place])   Constipation   Loculated pleural effusion   MSSA bacteremia   Empyema ([facility])   Hydropneumothorax  Subjective: E-visit Noted emergent transfer to ICU and emergent placement of chest tube for empyema.   Abtx:  Anti-infectives (From admission, onward)   Start     Dose/Rate Route Frequency Ordered Stop   11/25/19 1800  ceFEPIme (MAXIPIME) 2 g in sodium chloride 0.9 % 100 mL IVPB     2 g 200 mL/hr over 30 Minutes Intravenous Every 8 hours 11/25/19 1706     11/25/19 1800  linezolid (ZYVOX) IVPB 600 mg     600 mg 300 mL/hr over 60 Minutes Intravenous Every 12 hours 11/25/19 1707     11/25/19 1800  metroNIDAZOLE (FLAGYL) IVPB 500 mg     500 mg 100 mL/hr over 60 Minutes Intravenous Every 8 hours 11/25/19 1707     11/24/19 2300  ceFAZolin (ANCEF) IVPB 2g/100 mL premix  Status:  Discontinued     2 g 200 mL/hr over 30 Minutes Intravenous Every 8 hours 11/24/19 2215 11/25/19 1706   11/23/19 1800  metroNIDAZOLE (FLAGYL) IVPB 500 mg  Status:  Discontinued     500 mg 100 mL/hr over 60 Minutes Intravenous Every 8 hours 11/23/19 1636 11/24/19 2215   11/23/19 1800  linezolid (ZYVOX) IVPB 600 mg  Status:  Discontinued     600 mg 300 mL/hr over 60 Minutes Intravenous Every 12 hours 11/23/19 1636 11/24/19 2215   11/23/19 1400  ceFEPIme (MAXIPIME) 2 g in sodium chloride 0.9 % 100 mL IVPB  Status:  Discontinued     2 g 200 mL/hr over 30 Minutes Intravenous Every 8 hours 11/23/19 1317 11/24/19 2215   11/12/19 1000  remdesivir 100 mg in sodium chloride 0.9 % 100 mL IVPB   Status:  Discontinued     100 mg 200 mL/hr over 30 Minutes Intravenous Daily 11/11/19 1231 11/11/19 1238   11/12/19 1000  remdesivir 100 mg in sodium chloride 0.9 % 100 mL IVPB     100 mg 200 mL/hr over 30 Minutes Intravenous Daily 11/11/19 1239 11/14/19 0857   11/11/19 1330  cefTRIAXone (ROCEPHIN) 1 g in sodium chloride 0.9 % 100 mL IVPB     1 g 200 mL/hr over 30 Minutes Intravenous Every 24 hours 11/11/19 1315 11/15/19 1334   11/11/19 1330  azithromycin (ZITHROMAX) 500 mg in sodium chloride 0.9 % 250 mL IVPB     500 mg 250 mL/hr over 60 Minutes Intravenous Every 24 hours 11/11/19 1315 11/13/19 1623   11/11/19 1230  remdesivir 200 mg in sodium chloride 0.9% 250 mL IVPB  Status:  Discontinued     200 mg 580 mL/hr over 30 Minutes Intravenous Once 11/11/19 1231 11/11/19 1238      Medications:  Scheduled: . vitamin C  500 mg Oral Daily  . bisacodyl  5 mg Oral Daily  . Chlorhexidine Gluconate Cloth  6 each Topical Daily  . docusate sodium  100 mg Oral BID  . insulin aspart  0-20 Units Subcutaneous TID WC  . insulin detemir  20 Units Subcutaneous BID  . methylPREDNISolone (SOLU-MEDROL) injection  30 mg Intravenous Daily  . metoprolol tartrate  25 mg Oral BID  . polyethylene glycol  17 g Oral Daily  . sodium chloride flush  3 mL Intravenous Q12H  . zinc sulfate  220 mg Oral Daily    Objective: Vital signs in last 24 hours: Temp:  [98.6 F (37 C)-99.5 F (37.5 C)] 98.6 F (37 C) (01/14 0400) Pulse Rate:  [73-155] 81 (01/14 0700) Resp:  [17-37] 25 (01/14 0700) BP: (100-138)/(71-103) 118/103 (01/14 0700) SpO2:  [82 %-100 %] 96 % (01/14 0700) FiO2 (%):  [100 %] 100 % (01/14 0400) Weight:  [95.5 kg] 95.5 kg (01/14 0500)   none, e-visit.   Lab Results Recent Labs    11/26/19 0326 11/27/19 0500  WBC 26.9* 20.1*  HGB 11.9* 10.9*  HCT 36.5* 33.8*  NA 137 139  K 4.2 4.1  CL 94* 99  CO2 30 29  BUN 56* 45*  CREATININE 1.46* 1.42*   Liver Panel Recent Labs     11/26/19 0326 11/27/19 0500  PROT 7.1 6.7  ALBUMIN 2.5* 2.3*  AST 49* 59*  ALT 82* 113*  ALKPHOS 137* 143*  BILITOT 0.9 0.4   Sedimentation Rate No results for input(s): ESRSEDRATE in the last 72 hours. C-Reactive Protein Recent Labs    11/25/19 0407 11/26/19 0326  CRP 23.3* 28.4*    Microbiology: Recent Results (from the past 240 hour(s))  Culture, blood (routine x 2)     Status: Abnormal   Collection Time: 11/23/19  2:10 PM   Specimen: BLOOD  Result Value Ref Range Status   Specimen Description   Final    BLOOD LEFT ARM Performed at [facility], [address], [place], [place] [address]    Special Requests   Final    BOTTLES DRAWN AEROBIC ONLY Blood Culture adequate volume Performed at [facility], [address], [place], [place] [address]    Culture  Setup Time   Final    AEROBIC BOTTLE ONLY GRAM POSITIVE COCCI IN CLUSTERS CRITICAL RESULT CALLED TO, READ BACK BY AND VERIFIED WITH: E. WILLIAMSON, PHARMD (GVC) AT 1255 ON 11/24/19 BY C. JESSUP, MT.    Culture (A)  Final    STAPHYLOCOCCUS AUREUS SUSCEPTIBILITIES PERFORMED ON PREVIOUS CULTURE WITHIN THE LAST 5 DAYS. Performed at [facility], [address] [place], [place] [address]    Report Status 11/26/2019 FINAL  Final  Culture, blood (routine x 2)     Status: Abnormal   Collection Time: 11/23/19  2:15 PM   Specimen: BLOOD  Result Value Ref Range Status   Specimen Description   Final    BLOOD LEFT HAND Performed at [facility], [address], [place], [place] [address]    Special Requests   Final    BOTTLES DRAWN AEROBIC ONLY Blood Culture adequate volume Performed at [facility], [address] [place], [place] [address]    Culture  Setup Time   Final    GRAM POSITIVE COCCI IN CLUSTERS AEROBIC BOTTLE ONLY CRITICAL RESULT CALLED TO, READ BACK BY AND VERIFIED WITH: K AMEND [facility] 2202 11/24/19 A BROWNING Performed at  [facility], [address], [place], [place] [address]    Culture STAPHYLOCOCCUS AUREUS (A)  Final   Report Status 11/26/2019 FINAL  Final   Organism ID, Bacteria STAPHYLOCOCCUS AUREUS  Final      Susceptibility  Staphylococcus aureus - MIC*    CIPROFLOXACIN <=0.5 SENSITIVE Sensitive     ERYTHROMYCIN >=8 RESISTANT Resistant     GENTAMICIN <=0.5 SENSITIVE Sensitive     OXACILLIN <=0.25 SENSITIVE Sensitive     TETRACYCLINE <=1 SENSITIVE Sensitive     VANCOMYCIN 1 SENSITIVE Sensitive     TRIMETH/SULFA <=10 SENSITIVE Sensitive     CLINDAMYCIN RESISTANT Resistant     RIFAMPIN <=0.5 SENSITIVE Sensitive     Inducible Clindamycin POSITIVE Resistant     * STAPHYLOCOCCUS AUREUS  Blood Culture ID Panel (Reflexed)     Status: Abnormal   Collection Time: 11/23/19  2:15 PM  Result Value Ref Range Status   Enterococcus species NOT DETECTED NOT DETECTED Final   Listeria monocytogenes NOT DETECTED NOT DETECTED Final   Staphylococcus species DETECTED (A) NOT DETECTED Final    Comment: CRITICAL RESULT CALLED TO, READ BACK BY AND VERIFIED WITH: K AMEND PHARMD 2202 11/24/19 A BROWNING    Staphylococcus aureus (BCID) DETECTED (A) NOT DETECTED Final    Comment: Methicillin (oxacillin) susceptible Staphylococcus aureus (MSSA). Preferred therapy is anti staphylococcal beta lactam antibiotic (Cefazolin or Nafcillin), unless clinically contraindicated. CRITICAL RESULT CALLED TO, READ BACK BY AND VERIFIED WITH: K AMEND PHARMD 2202 11/24/19 A BROWNING    Methicillin resistance NOT DETECTED NOT DETECTED Final   Streptococcus species NOT DETECTED NOT DETECTED Final   Streptococcus agalactiae NOT DETECTED NOT DETECTED Final   Streptococcus pneumoniae NOT DETECTED NOT DETECTED Final   Streptococcus pyogenes NOT DETECTED NOT DETECTED Final   Acinetobacter baumannii NOT DETECTED NOT DETECTED Final   Enterobacteriaceae species NOT DETECTED NOT DETECTED Final   Enterobacter cloacae complex NOT DETECTED NOT  DETECTED Final   Escherichia coli NOT DETECTED NOT DETECTED Final   Klebsiella oxytoca NOT DETECTED NOT DETECTED Final   Klebsiella pneumoniae NOT DETECTED NOT DETECTED Final   Proteus species NOT DETECTED NOT DETECTED Final   Serratia marcescens NOT DETECTED NOT DETECTED Final   Haemophilus influenzae NOT DETECTED NOT DETECTED Final   Neisseria meningitidis NOT DETECTED NOT DETECTED Final   Pseudomonas aeruginosa NOT DETECTED NOT DETECTED Final   Candida albicans NOT DETECTED NOT DETECTED Final   Candida glabrata NOT DETECTED NOT DETECTED Final   Candida krusei NOT DETECTED NOT DETECTED Final   Candida parapsilosis NOT DETECTED NOT DETECTED Final   Candida tropicalis NOT DETECTED NOT DETECTED Final    Comment: Performed at [facility], [address], [place], [place] [address]  Body fluid culture     Status: None (Preliminary result)   Collection Time: 11/25/19 11:33 AM   Specimen: Pleura; Body Fluid  Result Value Ref Range Status   Specimen Description   Final    PLEURAL RT Performed at [facility], [address] [place], [place] [address]    Special Requests   Final    NONE Performed at [facility], [address], [place], [place] [address]    Gram Stain   Final    MODERATE WBC PRESENT, PREDOMINANTLY PMN RARE GRAM POSITIVE COCCI IN PAIRS    Culture   Final    CULTURE REINCUBATED FOR BETTER GROWTH Performed at [facility], [address], [place], [place] [address]    Report Status PENDING  Incomplete    Studies/Results: CT CHEST WO CONTRAST  Result Date: 11/26/2019 CLINICAL DATA:  Follow-up empyema.  COVID-19 pneumonia.  [place]. EXAM: CT CHEST WITHOUT CONTRAST TECHNIQUE: Multidetector CT imaging of the chest was performed following  the standard protocol without IV contrast. COMPARISON:  11/23/2019 chest CT. Chest radiograph from earlier today. FINDINGS: Substantially motion degraded scan, limiting assessment.  Cardiovascular: Normal heart size. No significant pericardial effusion/thickening. Atherosclerotic nonaneurysmal thoracic aorta. Normal caliber pulmonary arteries. Mediastinum/Nodes: No discrete thyroid nodules. Unremarkable esophagus. No pathologically enlarged axillary, mediastinal or hilar lymph nodes, noting limited sensitivity for the detection of hilar adenopathy on this noncontrast study. Focus of mediastinal gas in upper anterior right pericardial region is mildly increased. Lungs/Pleura: Large right hydropneumothorax with numerous thin internal septations with air-fluid levels, overall increased in size since 11/23/2019 chest CT with worsening left mediastinal shift, predominantly due to worsening gas component. There is complete right lung atelectasis, worsened since 11/23/2019 chest CT. No left pneumothorax. No left pleural effusion. Extensive patchy ground-glass opacity and reticulation throughout the left lung appears similar. No discrete lung masses or significant pulmonary nodules. Upper abdomen: No acute abnormality. Musculoskeletal: No aggressive appearing focal osseous lesions. Mild to moderate gynecomastia, asymmetric to the left, unchanged. Mild thoracic spondylosis. IMPRESSION: 1. Limited substantially motion degraded scan. 2. Large right hydropneumothorax with numerous thin internal septations with air-fluid levels, overall increased in size since 11/23/2019 chest CT with worsening left mediastinal shift. 3. Complete right lung atelectasis, worsened since 11/23/2019 chest CT. 4. Pneumomediastinum in the anterior right pericardial region is increased. 5. Similar extensive patchy ground-glass opacity and reticulation in the left lung compatible with COVID-19 pneumonia. Aortic Atherosclerosis (ICD10-I70.0). These results were called by telephone at the time of interpretation on 11/26/2019 at 11:48 am to provider DR. Cyril MourningAKESH ALVA, who verbally acknowledged these results. Electronically Signed   By:  Delbert PhenixJason A Poff M.D.   On: 11/26/2019 11:48   DG CHEST PORT 1 VIEW  Result Date: 11/26/2019 CLINICAL DATA:  Chest tube.  COVID-19 infection. EXAM: PORTABLE CHEST 1 VIEW COMPARISON:  CT chest and chest radiograph same day. FINDINGS: Trachea is midline. Heart size is grossly stable. There is a small bore pigtail catheter at the base of the right hemithorax with incomplete re-expansion of the right lung. Residual moderate right hydropneumothorax. Diffuse bilateral airspace opacification persists. IMPRESSION: 1. Moderate residual right hydropneumothorax after right chest tube insertion. 2. Severe COVID-19 pneumonia. Electronically Signed   By: Leanna BattlesMelinda  Blietz M.D.   On: 11/26/2019 13:02   DG Chest Port 1 View  Result Date: 11/26/2019 CLINICAL DATA:  54 year old male with history of acute respiratory failure. COVID-19 infection. EXAM: PORTABLE CHEST 1 VIEW COMPARISON:  Chest x-ray 11/25/2019. FINDINGS: Patchy multifocal airspace throughout the left lung consolidation, most evident throughout the left mid to lower lung. Right-sided hydropneumothorax with near complete collapse of the right lung which appears as a mass-like opacity in the right perihilar region, similar to yesterday's examination. Pulmonary vasculature is obscured. Heart size is normal. Upper mediastinal contours are within normal limits. Aortic atherosclerosis. IMPRESSION: 1. Unchanged radiographic appearance the chest, as above. Electronically Signed   By: Trudie Reedaniel  Entrikin M.D.   On: 11/26/2019 09:17   DG Chest Port 1 View  Result Date: 11/25/2019 CLINICAL DATA:  Pleural effusion EXAM: PORTABLE CHEST 1 VIEW COMPARISON:  Chest radiograph 11/25/2019 FINDINGS: No significant interval change as compared to chest radiograph performed earlier the same day. Redemonstrated multiloculated right hydropneumothorax with dominant right apical pneumothorax component measuring approximately 12.1 x 9.3 cm (previously 12.1 x 9.1 cm). Similar appearance of cystic  changes within the right lung base. Redemonstrated intervening opacity within the right mid lung likely reflecting atelectasis. Grossly unchanged leftward mediastinal shift. Unchanged diffuse coarsened interstitial and airspace  opacities within the left lung, mid to basilar predominant. Cardiomediastinal silhouette unchanged. Aortic atherosclerosis. No acute bony abnormality. Overlying cardiac monitoring leads. IMPRESSION: 1. No significant change as compared to chest radiograph performed earlier the same day. 2. Loculated right hydropneumothorax with dominant right apical pneumothorax component. Leftward mediastinal shift. 3. Extensive cystic changes within the right lower lung with right mid lung atelectasis. 4. Diffuse coarsened interstitial and airspace opacities within the left lung. Electronically Signed   By: Jackey Loge DO   On: 11/25/2019 16:56   DG CHEST PORT 1 VIEW  Result Date: 11/25/2019 CLINICAL DATA:  Status post thoracentesis. EXAM: PORTABLE CHEST 1 VIEW COMPARISON:  CT chest 11/23/2019 FINDINGS: There is progressive shift of the mediastinum into the left hemithorax. Mild diffuse coarsened interstitial and airspace opacities are again noted within the left lung. No left pleural effusion identified. Status post right thoracentesis. There is a loculated pneumothorax within the right upper lobe which measures approximately 12.1 x 9.1 cm. Within the right lung base there are extensive cystic changes. Within the right midlung there is intervening area of increased opacification likely reflecting atelectatic lung. IMPRESSION: 1. Status post right thoracentesis with loculated pneumothorax within the right upper lobe. 2. Diffuse coarsened interstitial and airspace opacities within the left lung. 3. Extensive cystic changes within the right lower lung with right mid lung atelectatic change. Electronically Signed   By: Signa Kell M.D.   On: 11/25/2019 12:16     Assessment/Plan: Staph bacteremia  (MSSA) 2/4 PE Empyema COVID Obesity DM2, uncontrolled (A1C 7.3%) AKI Total days of antibiotics: 4 zyvox/cefepime/flagyl                Cr stable last 24h                                                                       Pleural fluid showing GPC Repeat BCx 1-12 pending.  Would consider stopping cefepime flagyl.  Will need TEE at some point.          Johny Sax MD, FACP Infectious Diseases (pager) [phone] [email or web address] 11/27/2019, 7:46 AM  LOS: 16 days

## 2019-11-27 NOTE — Progress Notes (Signed)
PROGRESS NOTE    Nathaniel Webb  GDJ:[id number] DOB: 24-Dec-1965 DOA: 11/11/2019 PCP: Rolm Gala, NP    Brief Narrative:  54 year old male who was transferred from [facility] for management of acute hypoxic respiratory failure, due to SARS COVID-19 viral pneumonia.  He does have history of type II that is mellitus, hypertension, dyslipidemia and obesity.  He reported 7 days of worsening and progressive dyspnea.  December 23 he was diagnosed with COVID-19 and managed as an outpatient.  He was admitted to the hospital on December 27 due to persistent symptoms and severe hypoxemia, oxygen saturation 50%.  Transferred to [facility] December 29, his lungs had bibasilar Rales, heart S1-S2 present rhythmic, abdomen soft, no lower extremity edema. His initial chest radiograph had bilateral interstitial infiltrates, right upper lobe, right lower lobe, left upper lobe.   Patient was admitted to the hospital working diagnosis of acute hypoxic respiratory failure due to SARS COVID-19 viral pneumonia.  Patient has been treated with steroids, remdesivir and Actemra.  His viral pneumonia has been complicated by MSSA bacteremia, right hydropneumothorax, bacterial over infection and empyema.  Patient underwent thoracentesis January 12 (Fluid cell count 117,299, turbid, 85% PMN/ culture positive for Staph aureus).    11/26/19 with worsening respiratory distress and hypoxemia, repeat CT chest personally reviewed, with worsening pneumothorax at the right apex. Patient underwent emergent placement of chest tube with improvement of oxygenation and symptoms (transferred to the ICU).     Assessment & Plan:   Principal Problem:   Acute respiratory failure with hypoxemia (HCC) Active Problems:   Type 2 diabetes, controlled, with peripheral neuropathy (HCC)   Hypertension   Morbid obesity (HCC)   Pneumonia due to COVID-19 virus   Diabetes mellitus type 2, uncontrolled, with complications (HCC)  Acute pulmonary embolus (HCC)   Demand ischemia (HCC)   Constipation   Loculated pleural effusion   MSSA bacteremia   Empyema (HCC)   Hydropneumothorax   1. Acute hypoxemic respiratory failure due to SARS COVID 19 viral pneumonia, complicated with bacterial pneumonia, right empyema and right pneumothorax. Severe COVID 19 viral lung injury.   sp Remdesivir #5/5, tocilizumab #1.   RR: 16  Pulse oxymetry: 100  Fi02: 15 HFNC and NRBM  COVID-19 Labs  Recent Labs    11/25/19 0407 11/25/19 1240 11/26/19 0326 11/27/19 0500  DDIMER 1.32*  --  1.81* 2.24*  FERRITIN 1,853*  --  2,001* 2,001*  LDH  --  350*  --   --   CRP 23.3*  --  28.4* 20.9*    No results found for: SARSCOV2NAA  Patient continue to have elevated inflammatory markers and high oxygen requirements. Follow up chest film today personally reviewed, noted lung expansion on the right with small hydropneumothorax at the right base.   WBC today down to 20,1, . Blood cultures on 1/10, positive for Staphylococcus aureus/ MSSA. Fluid culture positive for Staph aureus.   Antibiotic therapy with now deescalated to IV cefazolin (11/27/19).   Continue slow steroid taper for severe COVID 19 lung injury.   Chest tube to suction 20 cm H20, with 600 cc serosanguinous fluid drainage over last 24 H, positive air leak. Will add morphine for pleuritic pain control.   3. Paroxysmal atrial fibrillation. Patient converted to sinus rhythm, with AV blockade. Will increase metoprolol to 50 gm po bid, will continue anticoagulation with heparin IV for now. His echocardiogram from 01/09 has a preserved LV and RV systolic function.   2. Acute pulmonary embolism. presureserved  LV and RV function. Tolerating well anticoagulation with heparin. Will continue IV anticoagulation for now until pneumothorax more stable and chest tube is removed.   3. Acute on chronic diastolic heart failure. received diuresis with furosemide. Will continue slow reate  of saline for now. No signs of acute exacerbation.   4. HTN.  Metoprolol titrate with good toleration.   5. AKI with hyperkalemia and hyponatremia. Stable renal function with serum cr at 1,42 with K at 4,1 and serum Na at 139. Continue hydration with saline for now.  6. Uncontrolled T2DM ( Hgb G4W 7.3), complicated with steroid induced hyperglycemia. Fasting glucose this am 93. Glucose cover and monitoring with sliding scale. Will decrease basal insulin to 10units bid of detemir to prevent hypoglycemia.    DVT prophylaxis: heparin IV   Code Status:  full Family Communication: no family at the bedside  Disposition Plan/ discharge barriers:  Patient continue to be at high risk for worsening respiratory and cardiovascular decompensation, will transfer to telemetry if stable during the morning.   Consultants:   Pulmonary   ID   Procedures:   01/12 thoracentesis R   01/13 right chest tube   Antimicrobials:   Initially on Cefepime, Metronidazole, and Linezolid changed to Cefazolin on 01.14.21     Subjective: This am patient is feeling better but continue to have dyspnea and pleuritic chest pain, no nausea or vomiting. No cough. Episodic SVT during the morning.   Objective: Vitals:   11/27/19 0500 11/27/19 0600 11/27/19 0700 11/27/19 0800  BP: 124/86 124/87 (!) 118/103 118/67  Pulse: 73 78 81 68  Resp: 18 18 (!) 25 16  Temp:      TempSrc:      SpO2: 100% 100% 96% 100%  Weight: 95.5 kg     Height:        Intake/Output Summary (Last 24 hours) at 11/27/2019 0825 Last data filed at 11/27/2019 0800 Gross per 24 hour  Intake 1911.51 ml  Output 1895 ml  Net 16.51 ml   Filed Weights   11/25/19 0500 11/26/19 0500 11/27/19 0500  Weight: 94.8 kg 91.3 kg 95.5 kg    Examination:   General: Not in pain. deconditioned  Neurology: Awake and alert, non focal  E ENT: no pallor, no icterus, oral mucosa moist Cardiovascular: No JVD. S1-S2 present, rhythmic, no gallops,  rubs, or murmurs. No lower extremity edema. Pulmonary: positive breath sounds bilaterally. Gastrointestinal. Abdomen with no organomegaly, non tender, no rebound or guarding Skin. No rashes Musculoskeletal: no joint deformities  Positive right sided chest tube. Positive air leak.    Data Reviewed: I have personally reviewed following labs and imaging studies  CBC: Recent Labs  Lab 11/23/19 0429 11/24/19 0555 11/25/19 1240 11/26/19 0326 11/27/19 0500  WBC 46.2* 35.8* 26.6* 26.9* 20.1*  NEUTROABS 40.8* 30.5* 23.2* 21.5* 15.4*  HGB 14.3 13.4 12.3* 11.9* 10.9*  HCT 43.8 40.1 37.6* 36.5* 33.8*  MCV 80.1 79.2* 79.3* 81.3 82.0  PLT 217 185 208 200 102   Basic Metabolic Panel: Recent Labs  Lab 11/22/19 0408 11/22/19 0408 11/23/19 0429 11/24/19 0555 11/25/19 0407 11/25/19 1240 11/26/19 0326 11/27/19 0500  NA 132*   < > 128* 130*  --  138 137 139  K 4.9   < > 5.3* 5.2*  --  4.1 4.2 4.1  CL 91*   < > 87* 92*  --  96* 94* 99  CO2 27   < > 27 24  --  28 30 29   GLUCOSE  169*   < > 179* 189*  --  205* 169* 93  BUN 33*   < > 50* 63*  --  62* 56* 45*  CREATININE 1.17   < > 1.28* 1.31*  --  1.42* 1.46* 1.42*  CALCIUM 8.4*   < > 8.3* 8.2*  --  7.9* 7.8* 7.7*  MG 3.5*  --  3.5* 4.2* 4.4*  --  4.2*  --   PHOS 4.4  --  4.7* 5.0* 5.7*  --  4.5  --    < > = values in this interval not displayed.   GFR: Estimated Creatinine Clearance: 64.6 mL/min (A) (by C-G formula based on SCr of 1.42 mg/dL (H)). Liver Function Tests: Recent Labs  Lab 11/23/19 0429 11/24/19 0555 11/25/19 1240 11/26/19 0326 11/27/19 0500  AST 29 36 36 49* 59*  ALT 68* 76* 88* 82* 113*  ALKPHOS 133* 116 122 137* 143*  BILITOT 1.0 1.0 0.8 0.9 0.4  PROT 6.9 6.8 7.1  7.1 7.1 6.7  ALBUMIN 3.0* 2.8* 2.5* 2.5* 2.3*   No results for input(s): LIPASE, AMYLASE in the last 168 hours. No results for input(s): AMMONIA in the last 168 hours. Coagulation Profile: Recent Labs  Lab 11/22/19 0408 11/23/19 0429  11/24/19 0555 11/25/19 0407 11/26/19 0326  INR 2.3* 1.7* 2.1* 1.3* 1.3*   Cardiac Enzymes: No results for input(s): CKTOTAL, CKMB, CKMBINDEX, TROPONINI in the last 168 hours. BNP (last 3 results) No results for input(s): PROBNP in the last 8760 hours. HbA1C: No results for input(s): HGBA1C in the last 72 hours. CBG: Recent Labs  Lab 11/26/19 0751 11/26/19 1304 11/26/19 1652 11/26/19 2108 11/27/19 0747  GLUCAP 147* 398* 355* 249* 84   Lipid Profile: No results for input(s): CHOL, HDL, LDLCALC, TRIG, CHOLHDL, LDLDIRECT in the last 72 hours. Thyroid Function Tests: No results for input(s): TSH, T4TOTAL, FREET4, T3FREE, THYROIDAB in the last 72 hours. Anemia Panel: Recent Labs    11/25/19 0407 11/26/19 0326  FERRITIN 1,853* 2,001*      Radiology Studies: I have reviewed all of the imaging during this hospital visit personally     Scheduled Meds: . vitamin C  500 mg Oral Daily  . bisacodyl  5 mg Oral Daily  . Chlorhexidine Gluconate Cloth  6 each Topical Daily  . docusate sodium  100 mg Oral BID  . insulin aspart  0-20 Units Subcutaneous TID WC  . insulin detemir  20 Units Subcutaneous BID  . methylPREDNISolone (SOLU-MEDROL) injection  30 mg Intravenous Daily  . metoprolol tartrate  25 mg Oral BID  . polyethylene glycol  17 g Oral Daily  . sodium chloride flush  3 mL Intravenous Q12H  . zinc sulfate  220 mg Oral Daily   Continuous Infusions: . sodium chloride    . sodium chloride    . ceFEPime (MAXIPIME) IV 2 g (11/27/19 0230)  . heparin 1,400 Units/hr (11/27/19 0800)  . linezolid (ZYVOX) IV 600 mg (11/26/19 2250)  . metronidazole 500 mg (11/27/19 0535)     LOS: 16 days        Saya Mccoll Annett Gula, MD

## 2019-11-28 ENCOUNTER — Ambulatory Visit: Payer: No Typology Code available for payment source | Admitting: Podiatry

## 2019-11-28 ENCOUNTER — Inpatient Hospital Stay (HOSPITAL_COMMUNITY): Payer: Medicaid Other

## 2019-11-28 DIAGNOSIS — J15211 Pneumonia due to Methicillin susceptible Staphylococcus aureus: Secondary | ICD-10-CM

## 2019-11-28 LAB — CBC WITH DIFFERENTIAL/PLATELET
Abs Immature Granulocytes: 0.78 10*3/uL — ABNORMAL HIGH (ref 0.00–0.07)
Basophils Absolute: 0.1 10*3/uL (ref 0.0–0.1)
Basophils Relative: 1 %
Eosinophils Absolute: 0.2 10*3/uL (ref 0.0–0.5)
Eosinophils Relative: 1 %
HCT: 35.8 % — ABNORMAL LOW (ref 39.0–52.0)
Hemoglobin: 11.5 g/dL — ABNORMAL LOW (ref 13.0–17.0)
Immature Granulocytes: 4 %
Lymphocytes Relative: 13 %
Lymphs Abs: 2.4 10*3/uL (ref 0.7–4.0)
MCH: 26.4 pg (ref 26.0–34.0)
MCHC: 32.1 g/dL (ref 30.0–36.0)
MCV: 82.1 fL (ref 80.0–100.0)
Monocytes Absolute: 1.2 10*3/uL — ABNORMAL HIGH (ref 0.1–1.0)
Monocytes Relative: 7 %
Neutro Abs: 13.3 10*3/uL — ABNORMAL HIGH (ref 1.7–7.7)
Neutrophils Relative %: 74 %
Platelets: 192 10*3/uL (ref 150–400)
RBC: 4.36 MIL/uL (ref 4.22–5.81)
RDW: 16.1 % — ABNORMAL HIGH (ref 11.5–15.5)
WBC: 18 10*3/uL — ABNORMAL HIGH (ref 4.0–10.5)
nRBC: 0.2 % (ref 0.0–0.2)

## 2019-11-28 LAB — BODY FLUID CULTURE

## 2019-11-28 LAB — COMPREHENSIVE METABOLIC PANEL
ALT: 101 U/L — ABNORMAL HIGH (ref 0–44)
AST: 50 U/L — ABNORMAL HIGH (ref 15–41)
Albumin: 2.3 g/dL — ABNORMAL LOW (ref 3.5–5.0)
Alkaline Phosphatase: 152 U/L — ABNORMAL HIGH (ref 38–126)
Anion gap: 12 (ref 5–15)
BUN: 36 mg/dL — ABNORMAL HIGH (ref 6–20)
CO2: 26 mmol/L (ref 22–32)
Calcium: 7.9 mg/dL — ABNORMAL LOW (ref 8.9–10.3)
Chloride: 100 mmol/L (ref 98–111)
Creatinine, Ser: 1.22 mg/dL (ref 0.61–1.24)
GFR calc Af Amer: >60 mL/min (ref >60)
GFR calc non Af Amer: >60 mL/min (ref >60)
Glucose, Bld: 113 mg/dL — ABNORMAL HIGH (ref 70–99)
Potassium: 4.7 mmol/L (ref 3.5–5.1)
Sodium: 138 mmol/L (ref 135–145)
Total Bilirubin: 0.4 mg/dL (ref 0.3–1.2)
Total Protein: 7 g/dL (ref 6.5–8.1)

## 2019-11-28 LAB — HEPARIN LEVEL (UNFRACTIONATED): Heparin Unfractionated: 0.33 IU/mL (ref 0.30–0.70)

## 2019-11-28 LAB — FERRITIN: Ferritin: 2274 ng/mL — ABNORMAL HIGH (ref 24–336)

## 2019-11-28 LAB — GLUCOSE, CAPILLARY
Glucose-Capillary: 107 mg/dL — ABNORMAL HIGH (ref 70–99)
Glucose-Capillary: 217 mg/dL — ABNORMAL HIGH (ref 70–99)
Glucose-Capillary: 324 mg/dL — ABNORMAL HIGH (ref 70–99)
Glucose-Capillary: 258 mg/dL — ABNORMAL HIGH (ref 70–99)

## 2019-11-28 LAB — C-REACTIVE PROTEIN: CRP: 14.9 mg/dL — ABNORMAL HIGH (ref <1.0)

## 2019-11-28 LAB — APTT: aPTT: 49 seconds — ABNORMAL HIGH (ref 24–36)

## 2019-11-28 LAB — D-DIMER, QUANTITATIVE: D-Dimer, Quant: 3.59 ug/mL-FEU — ABNORMAL HIGH (ref 0.00–0.50)

## 2019-11-28 NOTE — Progress Notes (Addendum)
INFECTIOUS DISEASE PROGRESS NOTE  ID: Jenelle MagesJohnny R Mchan is a 54 y.o. male with  Principal Problem:   Acute respiratory failure with hypoxemia (HCC) Active Problems:   Type 2 diabetes, controlled, with peripheral neuropathy (HCC)   Hypertension   Morbid obesity (HCC)   Pneumonia due to COVID-19 virus   Diabetes mellitus type 2, uncontrolled, with complications (HCC)   Acute pulmonary embolus (HCC)   Demand ischemia (HCC)   Constipation   Loculated pleural effusion   MSSA bacteremia   Empyema (HCC)   Hydropneumothorax   ARDS (adult respiratory distress syndrome) (HCC)  Subjective: evisit  Abtx:  Anti-infectives (From admission, onward)   Start     Dose/Rate Route Frequency Ordered Stop   11/27/19 1400  ceFAZolin (ANCEF) IVPB 2g/100 mL premix     2 g 200 mL/hr over 30 Minutes Intravenous Every 8 hours 11/27/19 1202     11/25/19 1800  ceFEPIme (MAXIPIME) 2 g in sodium chloride 0.9 % 100 mL IVPB  Status:  Discontinued     2 g 200 mL/hr over 30 Minutes Intravenous Every 8 hours 11/25/19 1706 11/27/19 1056   11/25/19 1800  linezolid (ZYVOX) IVPB 600 mg  Status:  Discontinued     600 mg 300 mL/hr over 60 Minutes Intravenous Every 12 hours 11/25/19 1707 11/27/19 1056   11/25/19 1800  metroNIDAZOLE (FLAGYL) IVPB 500 mg  Status:  Discontinued     500 mg 100 mL/hr over 60 Minutes Intravenous Every 8 hours 11/25/19 1707 11/27/19 1056   11/24/19 2300  ceFAZolin (ANCEF) IVPB 2g/100 mL premix  Status:  Discontinued     2 g 200 mL/hr over 30 Minutes Intravenous Every 8 hours 11/24/19 2215 11/25/19 1706   11/23/19 1800  metroNIDAZOLE (FLAGYL) IVPB 500 mg  Status:  Discontinued     500 mg 100 mL/hr over 60 Minutes Intravenous Every 8 hours 11/23/19 1636 11/24/19 2215   11/23/19 1800  linezolid (ZYVOX) IVPB 600 mg  Status:  Discontinued     600 mg 300 mL/hr over 60 Minutes Intravenous Every 12 hours 11/23/19 1636 11/24/19 2215   11/23/19 1400  ceFEPIme (MAXIPIME) 2 g in sodium chloride  0.9 % 100 mL IVPB  Status:  Discontinued     2 g 200 mL/hr over 30 Minutes Intravenous Every 8 hours 11/23/19 1317 11/24/19 2215   11/12/19 1000  remdesivir 100 mg in sodium chloride 0.9 % 100 mL IVPB  Status:  Discontinued     100 mg 200 mL/hr over 30 Minutes Intravenous Daily 11/11/19 1231 11/11/19 1238   11/12/19 1000  remdesivir 100 mg in sodium chloride 0.9 % 100 mL IVPB     100 mg 200 mL/hr over 30 Minutes Intravenous Daily 11/11/19 1239 11/14/19 0857   11/11/19 1330  cefTRIAXone (ROCEPHIN) 1 g in sodium chloride 0.9 % 100 mL IVPB     1 g 200 mL/hr over 30 Minutes Intravenous Every 24 hours 11/11/19 1315 11/15/19 1334   11/11/19 1330  azithromycin (ZITHROMAX) 500 mg in sodium chloride 0.9 % 250 mL IVPB     500 mg 250 mL/hr over 60 Minutes Intravenous Every 24 hours 11/11/19 1315 11/13/19 1623   11/11/19 1230  remdesivir 200 mg in sodium chloride 0.9% 250 mL IVPB  Status:  Discontinued     200 mg 580 mL/hr over 30 Minutes Intravenous Once 11/11/19 1231 11/11/19 1238      Medications:  Scheduled: . vitamin C  500 mg Oral Daily  . bisacodyl  5  mg Oral Daily  . Chlorhexidine Gluconate Cloth  6 each Topical Daily  . chlorpheniramine-HYDROcodone  5 mL Oral Q12H  . insulin aspart  0-20 Units Subcutaneous TID WC  . insulin detemir  10 Units Subcutaneous BID  . methylPREDNISolone (SOLU-MEDROL) injection  30 mg Intravenous Daily  . metoprolol tartrate  50 mg Oral BID  . polyethylene glycol  17 g Oral Daily  . sodium chloride flush  3 mL Intravenous Q12H  . zinc sulfate  220 mg Oral Daily    Objective: Vital signs in last 24 hours: Temp:  [97 F (36.1 C)-99.2 F (37.3 C)] 97.9 F (36.6 C) (01/15 0736) Pulse Rate:  [73-100] 100 (01/15 0736) Resp:  [22-33] 24 (01/15 0736) BP: (110-138)/(61-93) 117/71 (01/15 0736) SpO2:  [93 %-100 %] 98 % (01/15 0736) FiO2 (%):  [100 %] 100 % (01/14 1200) Weight:  [95.3 kg] 95.3 kg (01/15 0616)   evisit  Lab Results Recent Labs     11/27/19 0500 11/28/19 0310  WBC 20.1* 18.0*  HGB 10.9* 11.5*  HCT 33.8* 35.8*  NA 139 138  K 4.1 4.7  CL 99 100  CO2 29 26  BUN 45* 36*  CREATININE 1.42* 1.22   Liver Panel Recent Labs    11/27/19 0500 11/28/19 0310  PROT 6.7 7.0  ALBUMIN 2.3* 2.3*  AST 59* 50*  ALT 113* 101*  ALKPHOS 143* 152*  BILITOT 0.4 0.4   Sedimentation Rate No results for input(s): ESRSEDRATE in the last 72 hours. C-Reactive Protein Recent Labs    11/27/19 0500 11/28/19 0310  CRP 20.9* 14.9*    Microbiology: Recent Results (from the past 240 hour(s))  Culture, blood (routine x 2)     Status: Abnormal   Collection Time: 11/23/19  2:10 PM   Specimen: BLOOD  Result Value Ref Range Status   Specimen Description   Final    BLOOD LEFT ARM Performed at [facility], [address], [place], [place] [address]    Special Requests   Final    BOTTLES DRAWN AEROBIC ONLY Blood Culture adequate volume Performed at [facility], [address] [place], [place] [address]    Culture  Setup Time   Final    AEROBIC BOTTLE ONLY GRAM POSITIVE COCCI IN CLUSTERS CRITICAL RESULT CALLED TO, READ BACK BY AND VERIFIED WITH: E. WILLIAMSON, PHARMD (GVC) AT 1255 ON 11/24/19 BY C. JESSUP, MT.    Culture (A)  Final    STAPHYLOCOCCUS AUREUS SUSCEPTIBILITIES PERFORMED ON PREVIOUS CULTURE WITHIN THE LAST 5 DAYS. Performed at [facility], [address], [place], [place] [address]    Report Status 11/26/2019 FINAL  Final  Culture, blood (routine x 2)     Status: Abnormal   Collection Time: 11/23/19  2:15 PM   Specimen: BLOOD  Result Value Ref Range Status   Specimen Description   Final    BLOOD LEFT HAND Performed at [facility], [address], [place], [place] [address]    Special Requests   Final    BOTTLES DRAWN AEROBIC ONLY Blood Culture adequate volume Performed at [facility], [address] [place], [place]  [address]    Culture  Setup Time   Final    GRAM POSITIVE COCCI IN CLUSTERS AEROBIC BOTTLE ONLY CRITICAL RESULT CALLED TO, READ BACK BY AND VERIFIED WITH: K AMEND [facility] 2202 11/24/19 A BROWNING Performed at [facility], [address], [place], [place] [address]  Culture STAPHYLOCOCCUS AUREUS (A)  Final   Report Status 11/26/2019 FINAL  Final   Organism ID, Bacteria STAPHYLOCOCCUS AUREUS  Final      Susceptibility   Staphylococcus aureus - MIC*    CIPROFLOXACIN <=0.5 SENSITIVE Sensitive     ERYTHROMYCIN >=8 RESISTANT Resistant     GENTAMICIN <=0.5 SENSITIVE Sensitive     OXACILLIN <=0.25 SENSITIVE Sensitive     TETRACYCLINE <=1 SENSITIVE Sensitive     VANCOMYCIN 1 SENSITIVE Sensitive     TRIMETH/SULFA <=10 SENSITIVE Sensitive     CLINDAMYCIN RESISTANT Resistant     RIFAMPIN <=0.5 SENSITIVE Sensitive     Inducible Clindamycin POSITIVE Resistant     * STAPHYLOCOCCUS AUREUS  Blood Culture ID Panel (Reflexed)     Status: Abnormal   Collection Time: 11/23/19  2:15 PM  Result Value Ref Range Status   Enterococcus species NOT DETECTED NOT DETECTED Final   Listeria monocytogenes NOT DETECTED NOT DETECTED Final   Staphylococcus species DETECTED (A) NOT DETECTED Final    Comment: CRITICAL RESULT CALLED TO, READ BACK BY AND VERIFIED WITH: K AMEND PHARMD 2202 11/24/19 A BROWNING    Staphylococcus aureus (BCID) DETECTED (A) NOT DETECTED Final    Comment: Methicillin (oxacillin) susceptible Staphylococcus aureus (MSSA). Preferred therapy is anti staphylococcal beta lactam antibiotic (Cefazolin or Nafcillin), unless clinically contraindicated. CRITICAL RESULT CALLED TO, READ BACK BY AND VERIFIED WITH: K AMEND PHARMD 2202 11/24/19 A BROWNING    Methicillin resistance NOT DETECTED NOT DETECTED Final   Streptococcus species NOT DETECTED NOT DETECTED Final   Streptococcus agalactiae NOT DETECTED NOT DETECTED Final   Streptococcus pneumoniae NOT DETECTED NOT DETECTED Final   Streptococcus  pyogenes NOT DETECTED NOT DETECTED Final   Acinetobacter baumannii NOT DETECTED NOT DETECTED Final   Enterobacteriaceae species NOT DETECTED NOT DETECTED Final   Enterobacter cloacae complex NOT DETECTED NOT DETECTED Final   Escherichia coli NOT DETECTED NOT DETECTED Final   Klebsiella oxytoca NOT DETECTED NOT DETECTED Final   Klebsiella pneumoniae NOT DETECTED NOT DETECTED Final   Proteus species NOT DETECTED NOT DETECTED Final   Serratia marcescens NOT DETECTED NOT DETECTED Final   Haemophilus influenzae NOT DETECTED NOT DETECTED Final   Neisseria meningitidis NOT DETECTED NOT DETECTED Final   Pseudomonas aeruginosa NOT DETECTED NOT DETECTED Final   Candida albicans NOT DETECTED NOT DETECTED Final   Candida glabrata NOT DETECTED NOT DETECTED Final   Candida krusei NOT DETECTED NOT DETECTED Final   Candida parapsilosis NOT DETECTED NOT DETECTED Final   Candida tropicalis NOT DETECTED NOT DETECTED Final    Comment: Performed at [facility], [address], [place], [place] [address]  Body fluid culture     Status: None (Preliminary result)   Collection Time: 11/25/19 11:33 AM   Specimen: Pleura; Body Fluid  Result Value Ref Range Status   Specimen Description   Final    PLEURAL RT Performed at [facility], [address] [place], [place] [address]    Special Requests   Final    NONE Performed at [facility], [address], [place], [place] [address]    Gram Stain   Final    MODERATE WBC PRESENT, PREDOMINANTLY PMN RARE GRAM POSITIVE COCCI IN PAIRS Performed at [facility], [address], [place], [place] [address]    Culture MODERATE STAPHYLOCOCCUS AUREUS  Final   Report Status PENDING  Incomplete  Culture, blood (routine x 2)     Status: None (Preliminary  result)   Collection Time: 11/25/19  7:05 PM   Specimen: BLOOD  Result Value Ref Range Status   Specimen Description   Final    BLOOD Performed at [facility], [address] [place], [place] [address]    Special Requests   Final    Normal Performed at [facility], [address] [place], [place] [address]    Culture   Final    NO GROWTH 1 DAY Performed at [facility], [address], [place], [place] [address]    Report Status PENDING  Incomplete    Studies/Results: DG Chest 1 View  Result Date: 11/28/2019 CLINICAL DATA:  Pneumothorax.  COVID. EXAM: CHEST  1 VIEW COMPARISON:  11/27/2019.  CT 11/26/2019. FINDINGS: Heart size stable. Right chest tube in stable position. Known prominent right hydropneumothorax again noted. Persistent bilateral pulmonary infiltrates. Persistent atelectatic changes both lung bases. No acute bony abnormality. IMPRESSION: 1. Right chest tube in stable position. Known prominent right hydropneumothorax again noted. Similar findings noted on prior exam. 2. Persistent bilateral pulmonary infiltrates. Persistent atelectatic changes both lung bases. Electronically Signed   By: Maisie Fus  Register   On: 11/28/2019 07:32   CT CHEST WO CONTRAST  Result Date: 11/26/2019 CLINICAL DATA:  Follow-up empyema.  COVID-19 pneumonia.  Invasion. EXAM: CT CHEST WITHOUT CONTRAST TECHNIQUE: Multidetector CT imaging of the chest was performed following the standard protocol without IV contrast. COMPARISON:  11/23/2019 chest CT. Chest radiograph from earlier today. FINDINGS: Substantially motion degraded scan, limiting assessment. Cardiovascular: Normal heart size. No significant pericardial effusion/thickening. Atherosclerotic nonaneurysmal thoracic aorta. Normal caliber pulmonary arteries. Mediastinum/Nodes: No discrete thyroid nodules. Unremarkable esophagus. No pathologically enlarged axillary, mediastinal or hilar lymph nodes, noting limited sensitivity for the detection of hilar adenopathy on this noncontrast study. Focus of mediastinal gas in upper anterior right pericardial region is mildly  increased. Lungs/Pleura: Large right hydropneumothorax with numerous thin internal septations with air-fluid levels, overall increased in size since 11/23/2019 chest CT with worsening left mediastinal shift, predominantly due to worsening gas component. There is complete right lung atelectasis, worsened since 11/23/2019 chest CT. No left pneumothorax. No left pleural effusion. Extensive patchy ground-glass opacity and reticulation throughout the left lung appears similar. No discrete lung masses or significant pulmonary nodules. Upper abdomen: No acute abnormality. Musculoskeletal: No aggressive appearing focal osseous lesions. Mild to moderate gynecomastia, asymmetric to the left, unchanged. Mild thoracic spondylosis. IMPRESSION: 1. Limited substantially motion degraded scan. 2. Large right hydropneumothorax with numerous thin internal septations with air-fluid levels, overall increased in size since 11/23/2019 chest CT with worsening left mediastinal shift. 3. Complete right lung atelectasis, worsened since 11/23/2019 chest CT. 4. Pneumomediastinum in the anterior right pericardial region is increased. 5. Similar extensive patchy ground-glass opacity and reticulation in the left lung compatible with COVID-19 pneumonia. Aortic Atherosclerosis (ICD10-I70.0). These results were called by telephone at the time of interpretation on 11/26/2019 at 11:48 am to provider DR. Cyril Mourning, who verbally acknowledged these results. Electronically Signed   By: Delbert Phenix M.D.   On: 11/26/2019 11:48   DG CHEST PORT 1 VIEW  Result Date: 11/27/2019 CLINICAL DATA:  Pneumothorax. EXAM: PORTABLE CHEST 1 VIEW COMPARISON:  November 26, 2019. FINDINGS: Stable cardiomediastinal silhouette. Stable position of pigtail drainage catheter is seen in right lung base. Mild right basilar pneumothorax is noted with associated effusion. Stable bilateral lung opacities are noted concerning for multifocal pneumonia. Bony thorax is unremarkable.  IMPRESSION: Mild right basilar hydropneumothorax is noted.  Stable position of right basilar pigtail drainage catheter. Stable bilateral multifocal pneumonia. Electronically Signed   By: Marijo Conception M.D.   On: 11/27/2019 07:44   DG CHEST PORT 1 VIEW  Result Date: 11/26/2019 CLINICAL DATA:  Chest tube.  COVID-19 infection. EXAM: PORTABLE CHEST 1 VIEW COMPARISON:  CT chest and chest radiograph same day. FINDINGS: Trachea is midline. Heart size is grossly stable. There is a small bore pigtail catheter at the base of the right hemithorax with incomplete re-expansion of the right lung. Residual moderate right hydropneumothorax. Diffuse bilateral airspace opacification persists. IMPRESSION: 1. Moderate residual right hydropneumothorax after right chest tube insertion. 2. Severe COVID-19 pneumonia. Electronically Signed   By: Lorin Picket M.D.   On: 11/26/2019 13:02     Assessment/Plan: Staph bacteremia (MSSA) 2/4 PE Empyema (MSSA) COVID Obesity DM2, uncontrolled (A1C 7.3%) AKI Total days of antibiotics:5zyvox/cefepime/flagyl ---> ancef  Cr improved WBC improving, still on decadron Pleural fluid growing Staph aureus Repeat BCx 1-12 ngtd anbx simplified to ancef yeterday Will need TEE at some point.                            Virtual, 5 minute visit.                                Bobby Rumpf MD, FACP Infectious Diseases (pager) [phone] [email or web address] 11/28/2019, 9:43 AM  LOS: 17 days

## 2019-11-28 NOTE — Progress Notes (Signed)
ANTICOAGULATION CONSULT NOTE - Follow Up Consult  Pharmacy Consult for Heparin Indication: PE  No Known Allergies  Patient Measurements: Height: 5\' 5"  (165.1 cm) Weight: 210 lb 1.6 oz (95.3 kg) IBW/kg (Calculated) : 61.5 Heparin Dosing Weight: 85 kg  Vital Signs: Temp: 97.9 F (36.6 C) (01/15 0736) Temp Source: Oral (01/15 0736) BP: 117/71 (01/15 0736) Pulse Rate: 100 (01/15 0736)  Labs: Recent Labs    11/26/19 0326 11/26/19 0326 11/26/19 2134 11/27/19 0500 11/28/19 0310  HGB 11.9*   < >  --  10.9* 11.5*  HCT 36.5*  --   --  33.8* 35.8*  PLT 200  --   --  191 192  APTT  --   --  69* 55* 49*  LABPROT 15.9*  --   --   --   --   INR 1.3*  --   --   --   --   HEPARINUNFRC 0.49   < > 0.49 0.51 0.33  CREATININE 1.46*  --   --  1.42* 1.22   < > = values in this interval not displayed.    Estimated Creatinine Clearance: 75.1 mL/min (by C-G formula based on SCr of 1.22 mg/dL).  Assessment: 54 yo male with COVID pneumonitis started on Eliquis for PE, held for thoracentesis 1/12 and chest tube placement on 1/13.    Most recent dose of Eliquis 5mg  on 1/11; stop checking aptt  Hep lvl therapeutic this am  Cbc stable  Goal of Therapy:  Heparin level 0.3-0.7 units/ml  Monitor platelets by anticoagulation protocol: Yes   Plan:  Continue heparin 1400 units/hr  Daily hep lvl cbc  , PharmD, BCPS, BCCCP Clinical Pharmacist [phone]  Please check AMION for all [facility] Pharmacy numbers  11/28/2019 9:28 AM

## 2019-11-28 NOTE — Progress Notes (Signed)
  Speech Language Pathology Treatment: Dysphagia  Patient Details Name: Nathaniel Webb MRN: [id number] DOB: 1965-12-12 Today's Date: 11/28/2019 Time: 1415-1430 SLP Time Calculation (min) (ACUTE ONLY): 15 min  Assessment / Plan / Recommendation Clinical Impression  Pt transferred back to progressive unit from ICU.  Sitting in recliner with lunch tray in front of him.  Eating minimally. Continues on 15L HFNC and 15L NRB.  RR high 20s and Sp02 >90% during session and while eating/drinking. He occasionally coughs, but does so intermittently and it is not necessarily associated with PO intake. Nursing reports no trouble with meals today, other than poor appetite.  He appears to be protecting airway; reviewed basic precautions and posted sign on wall across from bed.  After initial cues, pt better able to monitor pacing, take rest breaks, not rush, and to prioritize breathing over drinking/eating.  Recommend that he continues on Dysphagia 3/thin liquids over weekend. SLP will follow for safety/toleration.   HPI HPI: 54 y.o. male with PMHx diabetes type 2 uncontrolled with complication/diabetic neuropathy, HTN, HLD, obesity presented to [facility] 12/29 with acute hypoxic respiratory failure, requiring HFNC to maintain 02 saturations. Dx'd with COVID-19 on 12/23.    1/10 CXR showed ARDS bilateral increasing opacification; respiratory status worsened; concerns for aspiration. Acute PE; loculated right pleural effusion; 1/11 CTS consult, recs chest tube. 1/12 CT chest with densely consolidated right lung, air fluid level/ hydropneumothorax. Thoracentesis 1/12; pleural fluid sampled and concerning for empyema.  1/13 emergently transferred to ICU for worsening hypoxia; CT showed worsening right hydropneumothorax with loculations in pleura. Emergent right chest tube placed.       SLP Plan  Continue with current plan of care       Recommendations  Diet recommendations: Dysphagia 3 (mechanical soft);Thin  liquid Liquids provided via: Cup;Straw Medication Administration: Whole meds with puree Supervision: Patient able to self feed Compensations: Slow rate;Small sips/bites Postural Changes and/or Swallow Maneuvers: Seated upright 90 degrees                Oral Care Recommendations: Oral care BID Follow up Recommendations: None SLP Visit Diagnosis: Dysphagia, unspecified (R13.10) Plan: Continue with current plan of care       GO                Blenda Mounts Laurice 11/28/2019, 2:48 PM   Qunisha Bryk L. Samson Frederic, MA CCC/SLP Acute Rehabilitation Services Office number [phone]

## 2019-11-28 NOTE — Progress Notes (Addendum)
PROGRESS NOTE    Nathaniel Webb  TWS:[id number] DOB: 19-Sep-1966 DOA: 11/11/2019 PCP: Rolm Gala, NP    Brief Narrative:  54 year old male who was transferred from [facility] for management of acute hypoxic respiratory failure,due to SARS COVID-19 viral pneumonia. He does have history of type II that is mellitus, hypertension, dyslipidemia and obesity. He reported 7 days of worsening and progressive dyspnea. December 23 he was diagnosed with COVID-19 and managed as an outpatient. He was admitted to the hospital on December 27 due to persistent symptoms and severe hypoxemia, oxygen saturation 50%.Transferred to [facility] campus December 29,his lungs had bibasilar Rales, heart S1-S2 present rhythmic, abdomen soft, no lower extremity edema. His initial chest radiograph had bilateral interstitial infiltrates, right upper lobe, right lower lobe, left upper lobe.  Patient was admitted to the hospital working diagnosis of acute hypoxic respiratory failure due to SARS COVID-19 viral pneumonia.  Patient has been treated with steroids, remdesivir and Actemra. His viral pneumonia has been complicated by MSSA bacteremia, right hydropneumothorax, bacterial over infection and empyema. Patient underwent thoracentesis January 12 (Fluid cell count 117,299, turbid, 85% PMN/ culture positive for Staph aureus).    11/26/19 with worsening respiratory distress and hypoxemia, repeat CT chest personally reviewed, with worsening pneumothorax at the right apex. Patient underwent emergent placement of chest tube with improvement of oxygenation and symptoms (transferred to the ICU).   Patient with improved symptoms after chest tube placement, continue to have high oxygen requirements and follow up imaging with persistent pneumothorax.    Assessment & Plan:   Principal Problem:   Acute respiratory failure with hypoxemia (HCC) Active Problems:   Type 2 diabetes, controlled, with peripheral  neuropathy (HCC)   Hypertension   Morbid obesity (HCC)   Pneumonia due to COVID-19 virus   Diabetes mellitus type 2, uncontrolled, with complications (HCC)   Acute pulmonary embolus (HCC)   Demand ischemia (HCC)   Constipation   Loculated pleural effusion   MSSA bacteremia   Empyema (HCC)   Hydropneumothorax   ARDS (adult respiratory distress syndrome) (HCC)   1. Acute hypoxemic respiratory failure due to SARS COVID 19 viral pneumonia, complicated with MSSA pneumonia/ bacteremia, right empyema and right pneumothorax.   Severe COVID 19 viral lung injury.   sp Remdesivir #5/5, tocilizumab #1.   Blood cultures on 1/10, positive for Staphylococcus aureus/ MSSA. Fluid culture positive for Staph aureus.   RR: 22 to 24  Pulse oxymetry: 93 to 98%  Fi02: 15 L/ min HFNC and NRBM  COVID-19 Labs  Recent Labs    11/25/19 1240 11/26/19 0326 11/27/19 0500 11/28/19 0310  DDIMER  --  1.81* 2.24* 3.59*  FERRITIN  --  2,001* 2,001* 2,274*  LDH 350*  --   --   --   CRP  --  28.4* 20.9* 14.9*    No results found for: SARSCOV2NAA   Persistent elevation in inflammatory markers. Will attempt decrease Fi02 as tolerated, target 02 saturation more than 88%.. Follow up chest film today personally reviewed, noted persistent hydropneumothorax.   WBC today down to 18 from 20. Responding well to antibiotic therapy with IV cefazolin (11/27/19). #2.    Right chest tube continue to suction 20 cm H20, with decreased output from 600 to 310 cc of serosanguinous fluid drainage over last 24 H, persistent moderate air leak.   Continue slow taper of prednisone and as needed morphine for pleuritic chest pain. Continue radiographic follow up and will check on pulmonary recommendations. PT and OT, out  of bed to chair tid with meals.   2. Paroxysmal atrial fibrillation. converted to sinus rhythm. Echocardiogram from 01/09 has a preserved LV and RV systolic function.  Will continue rate control with  metoprolol 50 mg po bid, continue anticoagulation with IV heparin for now. His  3. Acute pulmonary embolism.presureserved LV and RV function. On IV anticoagulation with heparin drip. Patient may need another invasive intervention to right chest.   4. Acute on chronic diastolic heart failure. sp diuresis with furosemide. Will hold on IV fluids for now, patient is euvolemic. Continue blood pressure monitoring.   5. HTN.  Blood pressure 136/82, continue metoprolol with good toleration.   6. AKI with hyperkalemia and hyponatremia. Renal function continue to improve now with serum cr at 1,22 with K at 4,7 and serum Na at 138. Patient is tolerating po well, will dc IV fluids.   6. Uncontrolled T2DM ( Hgb A1c 7.3), complicated with steroid induced hyperglycemia. Fasting glucose this am 113. Continue with lucose cover and monitoring with sliding scale. Continue basal insulin to 10 units bid.    DVT prophylaxis:heparinIV  Code Status:full Family Communication:I spoke over the phone with the patient's wife about patient's  condition, plan of care and all questions were addressed. Disposition Plan/ discharge barriers:Patient continue to be at high risk for worsening respiratory and cardiovascular decompensation., high oxygen requirements.   Consultants:   Pulmonary   ID   Procedures:   01/12 thoracentesis R   01/13 right chest tube   Antimicrobials:   Initially on Cefepime, Metronidazole, and Linezolid changed to Cefazolin on 01.14.21    Subjective: Patient continue to have dyspnea, intermittent severity and worse with movement. Right pleuritic chest pain has improved with analgesics. No further episodes of tachycardia.   Objective: Vitals:   11/27/19 1700 11/27/19 2103 11/28/19 0616 11/28/19 0736  BP:  133/80 129/70 117/71  Pulse: 80 96 87 100  Resp: (!) 24 (!) 24 (!) 22 (!) 24  Temp:  98.3 F (36.8 C) 98.7 F (37.1 C) 97.9 F (36.6 C)  TempSrc:  Oral Oral  Oral  SpO2: 100% 95% 93% 98%  Weight:   95.3 kg   Height:        Intake/Output Summary (Last 24 hours) at 11/28/2019 1011 Last data filed at 11/28/2019 1004 Gross per 24 hour  Intake 1755.89 ml  Output 1445 ml  Net 310.89 ml   Filed Weights   11/26/19 0500 11/27/19 0500 11/28/19 0616  Weight: 91.3 kg 95.5 kg 95.3 kg    Examination:   General: Not in pain but positive resting dyspnea, deconditioned.  Neurology: Awake and alert, non focal  E ENT: mild pallor, no icterus, oral mucosa moist Cardiovascular: No JVD. S1-S2 present, rhythmic, no gallops, rubs, or murmurs. No lower extremity edema. Pulmonary: positive breath sounds bilaterally, decreased air movement on the right.  Gastrointestinal. Abdomen with no organomegaly, non tender, no rebound or guarding Skin. No rashes Musculoskeletal: no joint deformities Positive right chest tube, with serosanguinous drainage, positive moderate air leak.     Data Reviewed: I have personally reviewed following labs and imaging studies  CBC: Recent Labs  Lab 11/24/19 0555 11/25/19 1240 11/26/19 0326 11/27/19 0500 11/28/19 0310  WBC 35.8* 26.6* 26.9* 20.1* 18.0*  NEUTROABS 30.5* 23.2* 21.5* 15.4* 13.3*  HGB 13.4 12.3* 11.9* 10.9* 11.5*  HCT 40.1 37.6* 36.5* 33.8* 35.8*  MCV 79.2* 79.3* 81.3 82.0 82.1  PLT 185 208 200 191 192   Basic Metabolic Panel: Recent Labs  Lab  11/22/19 0408 11/22/19 0408 11/23/19 0429 11/23/19 0429 11/24/19 0555 11/25/19 0407 11/25/19 1240 11/26/19 0326 11/27/19 0500 11/28/19 0310  NA 132*   < > 128*   < > 130*  --  138 137 139 138  K 4.9   < > 5.3*   < > 5.2*  --  4.1 4.2 4.1 4.7  CL 91*   < > 87*   < > 92*  --  96* 94* 99 100  CO2 27   < > 27   < > 24  --  28 30 29 26   GLUCOSE 169*   < > 179*   < > 189*  --  205* 169* 93 113*  BUN 33*   < > 50*   < > 63*  --  62* 56* 45* 36*  CREATININE 1.17   < > 1.28*   < > 1.31*  --  1.42* 1.46* 1.42* 1.22  CALCIUM 8.4*   < > 8.3*   < > 8.2*  --  7.9* 7.8*  7.7* 7.9*  MG 3.5*  --  3.5*  --  4.2* 4.4*  --  4.2*  --   --   PHOS 4.4  --  4.7*  --  5.0* 5.7*  --  4.5  --   --    < > = values in this interval not displayed.   GFR: Estimated Creatinine Clearance: 75.1 mL/min (by C-G formula based on SCr of 1.22 mg/dL). Liver Function Tests: Recent Labs  Lab 11/24/19 0555 11/25/19 1240 11/26/19 0326 11/27/19 0500 11/28/19 0310  AST 36 36 49* 59* 50*  ALT 76* 88* 82* 113* 101*  ALKPHOS 116 122 137* 143* 152*  BILITOT 1.0 0.8 0.9 0.4 0.4  PROT 6.8 7.1  7.1 7.1 6.7 7.0  ALBUMIN 2.8* 2.5* 2.5* 2.3* 2.3*   No results for input(s): LIPASE, AMYLASE in the last 168 hours. No results for input(s): AMMONIA in the last 168 hours. Coagulation Profile: Recent Labs  Lab 11/22/19 0408 11/23/19 0429 11/24/19 0555 11/25/19 0407 11/26/19 0326  INR 2.3* 1.7* 2.1* 1.3* 1.3*   Cardiac Enzymes: No results for input(s): CKTOTAL, CKMB, CKMBINDEX, TROPONINI in the last 168 hours. BNP (last 3 results) No results for input(s): PROBNP in the last 8760 hours. HbA1C: No results for input(s): HGBA1C in the last 72 hours. CBG: Recent Labs  Lab 11/27/19 1125 11/27/19 1210 11/27/19 1724 11/27/19 2059 11/28/19 0735  GLUCAP 362* 355* 194* 136* 107*   Lipid Profile: No results for input(s): CHOL, HDL, LDLCALC, TRIG, CHOLHDL, LDLDIRECT in the last 72 hours. Thyroid Function Tests: No results for input(s): TSH, T4TOTAL, FREET4, T3FREE, THYROIDAB in the last 72 hours. Anemia Panel: Recent Labs    11/27/19 0500 11/28/19 0310  FERRITIN 2,001* 2,274*      Radiology Studies: I have reviewed all of the imaging during this hospital visit personally     Scheduled Meds: . vitamin C  500 mg Oral Daily  . bisacodyl  5 mg Oral Daily  . Chlorhexidine Gluconate Cloth  6 each Topical Daily  . chlorpheniramine-HYDROcodone  5 mL Oral Q12H  . insulin aspart  0-20 Units Subcutaneous TID WC  . insulin detemir  10 Units Subcutaneous BID  . methylPREDNISolone  (SOLU-MEDROL) injection  30 mg Intravenous Daily  . metoprolol tartrate  50 mg Oral BID  . polyethylene glycol  17 g Oral Daily  . sodium chloride flush  3 mL Intravenous Q12H  . zinc sulfate  220 mg Oral  Daily   Continuous Infusions: . sodium chloride    . sodium chloride    .  ceFAZolin (ANCEF) IV 2 g (11/28/19 5041)  . heparin 1,400 Units/hr (11/28/19 0025)     LOS: 17 days        Eldredge Veldhuizen Annett Gula, MD

## 2019-11-28 NOTE — Progress Notes (Signed)
NAME:  Nathaniel Webb, MRN:  [id number], DOB:  [birth year], LOS: 17 ADMISSION DATE:  11/11/2019, CONSULTATION DATE:  1/12 REFERRING MD:  Joseph Art, CHIEF COMPLAINT:  Pleural effusion   Brief History   54 y/o male admitted with COVID pneumonia on 12/29, developed loculated pleural effusion by 1/10 with associated hydropneumothorax.    Past Medical History  DM II  HTN Morbid Obesity  Significant Hospital Events   12/29 Admit  1/12 PCCM consulted, thora performed 1/13 Tx to ICU with worsening pneumothorax   Consults:  PCCM  Procedures:  1/13 chest tube R side >   Significant Diagnostic Tests:  CTA Chest 12/29 >> positive for PE with small volume embolus in the distal right pulmonary artery, lobar, and some segmental branches CT ABD/Pelvis 1/10 >> moderate stool burden in colon, R>L airspace disease compatible with PNA, loculated right pleural effusion CT Chest w/o 1/10 >> multiloculated right lung hydropneumothorax, larger volume of pleural fluid relative to pleural air, progression of multilobar right lung consolidation, small volume loculated right pneumomediastinum Thora 1/12 >> glucose <20, LD 2,447, protein 5.2, total nucleated cells 117,299, neutrophil 85 CT Chest w/o 1/13 >> limited exam by motion, large right hydropneumothorax with numerous thin internal septations with air fluid levels increased in size, complete right lung atelectasis, pneumomediastinum in the anterior right pericardial region, similar extensive patchy GGO on L  Micro Data:  BCID 1/10 >> MSSA BCx2 1/10 >> MSSA  Pleural fluid culture 1/12 >> moderate staph aureus  Antimicrobials/COVID Rx  Remdesivir 12/27 > 1/1 Tocilizumab 12/28 >  Cefepime 1/10 >>1/14 Linezolid 1/10 >> 1/14 Cefazolin 1/14 >>  Interim history/subjective:  Pleural fluid growing staph (known in blood cultures) Afebrile On 15L HFNC Pt reports feeling better.  States he gets "winded" with eating breakfast.   Objective   Blood pressure  117/71, pulse 100, temperature 97.9 F (36.6 C), temperature source Oral, resp. rate (!) 24, height 5\' 5"  (1.651 m), weight 95.3 kg, SpO2 98 %.    FiO2 (%):  [100 %] 100 %   Intake/Output Summary (Last 24 hours) at 11/28/2019 0817 Last data filed at 11/28/2019 0700 Gross per 24 hour  Intake 2115.89 ml  Output 1695 ml  Net 420.89 ml   Filed Weights   11/26/19 0500 11/27/19 0500 11/28/19 0616  Weight: 91.3 kg 95.5 kg 95.3 kg    Examination: General: adult male sitting up in bed eating breakfast in NAD HEENT: MM pink/moist, [place] + NRB O2 Neuro: AAOx4, speech clear, MAE  CV: s1s2 RRR, no m/r/g PULM:  Tachypnea but not labored, diminished on R, clear anterior on left.  R chest tube dressing clean / dry/ intact, 20 cm suction  GI: soft, bsx4 active  Extremities: warm/dry, no edema  Skin: no rashes or lesions  CXR 1/15 - images reviewed, right chest tube in good position, unchanged right hydropneumothorax, unchanged bilateral infiltrates  Resolved Hospital Problem list    Assessment & Plan:   R Empyema MSSA Pneumonia  Hydropneumothorax COVID pneumonitis -continue chest tube to -20 cm suction  -continue chest tube flushes per protocol   Severe acute respiratory failure with hypoxemia due to persistent effect of COVID scarring and MSSA pneumonia -O2 to maintain saturations >85%  COVID Pneumonia Completed remdesivir and solumedrol -follow clinically   Small volume Acute PE -continue heparin per pharmacy   MSSA Bacteremia and Empyema  -continue cefazolin    Best practice:  DVT: heparin gtt Diet: per TRH     Labs   CBC:  Recent Labs  Lab 11/24/19 0555 11/25/19 1240 11/26/19 0326 11/27/19 0500 11/28/19 0310  WBC 35.8* 26.6* 26.9* 20.1* 18.0*  NEUTROABS 30.5* 23.2* 21.5* 15.4* 13.3*  HGB 13.4 12.3* 11.9* 10.9* 11.5*  HCT 40.1 37.6* 36.5* 33.8* 35.8*  MCV 79.2* 79.3* 81.3 82.0 82.1  PLT 185 208 200 191 161    Basic Metabolic Panel: Recent Labs  Lab  11/22/19 0408 11/22/19 0408 11/23/19 0429 11/23/19 0429 11/24/19 0555 11/25/19 0407 11/25/19 1240 11/26/19 0326 11/27/19 0500 11/28/19 0310  NA 132*   < > 128*   < > 130*  --  138 137 139 138  K 4.9   < > 5.3*   < > 5.2*  --  4.1 4.2 4.1 4.7  CL 91*   < > 87*   < > 92*  --  96* 94* 99 100  CO2 27   < > 27   < > 24  --  28 30 29 26   GLUCOSE 169*   < > 179*   < > 189*  --  205* 169* 93 113*  BUN 33*   < > 50*   < > 63*  --  62* 56* 45* 36*  CREATININE 1.17   < > 1.28*   < > 1.31*  --  1.42* 1.46* 1.42* 1.22  CALCIUM 8.4*   < > 8.3*   < > 8.2*  --  7.9* 7.8* 7.7* 7.9*  MG 3.5*  --  3.5*  --  4.2* 4.4*  --  4.2*  --   --   PHOS 4.4  --  4.7*  --  5.0* 5.7*  --  4.5  --   --    < > = values in this interval not displayed.   GFR: Estimated Creatinine Clearance: 75.1 mL/min (by C-G formula based on SCr of 1.22 mg/dL). Recent Labs  Lab 11/23/19 0429 11/23/19 0429 11/24/19 0555 11/24/19 0555 11/25/19 1240 11/26/19 0326 11/27/19 0500 11/28/19 0310  PROCALCITON 4.13  --  3.68  --   --   --   --   --   WBC 46.2*   < > 35.8*   < > 26.6* 26.9* 20.1* 18.0*   < > = values in this interval not displayed.    Liver Function Tests: Recent Labs  Lab 11/24/19 0555 11/25/19 1240 11/26/19 0326 11/27/19 0500 11/28/19 0310  AST 36 36 49* 59* 50*  ALT 76* 88* 82* 113* 101*  ALKPHOS 116 122 137* 143* 152*  BILITOT 1.0 0.8 0.9 0.4 0.4  PROT 6.8 7.1  7.1 7.1 6.7 7.0  ALBUMIN 2.8* 2.5* 2.5* 2.3* 2.3*   No results for input(s): LIPASE, AMYLASE in the last 168 hours. No results for input(s): AMMONIA in the last 168 hours.  ABG    Component Value Date/Time   PHART 7.467 (H) 11/21/2019 1846   PCO2ART 32.0 11/21/2019 1846   PO2ART 128.0 (H) 11/21/2019 1846   HCO3 23.1 11/21/2019 1846   TCO2 24 11/21/2019 1846   ACIDBASEDEF 4.1 (H) 11/09/2019 2210   O2SAT 99.0 11/21/2019 1846     Coagulation Profile: Recent Labs  Lab 11/22/19 0408 11/23/19 0429 11/24/19 0555 11/25/19 0407  11/26/19 0326  INR 2.3* 1.7* 2.1* 1.3* 1.3*    Cardiac Enzymes: No results for input(s): CKTOTAL, CKMB, CKMBINDEX, TROPONINI in the last 168 hours.  HbA1C: Hgb A1c MFr Bld  Date/Time Value Ref Range Status  11/10/2019 07:55 AM 7.3 (H) 4.8 - 5.6 % Final  Comment:    (NOTE) Pre diabetes:          5.7%-6.4% Diabetes:              >6.4% Glycemic control for   <7.0% adults with diabetes   06/11/2019 11:17 AM 6.4 (H) 4.8 - 5.6 % Final    Comment:             Prediabetes: 5.7 - 6.4          Diabetes: >6.4          Glycemic control for adults with diabetes: <7.0     CBG: Recent Labs  Lab 11/27/19 1125 11/27/19 1210 11/27/19 1724 11/27/19 2059 11/28/19 0735  GLUCAP 362* 355* 194* 136* 107*     Critical care time: n/a     Canary Brim, MSN, NP-C  Pulmonary & Critical Care 11/28/2019, 8:17 AM   Please see [email or web address] for pager details.

## 2019-11-29 ENCOUNTER — Inpatient Hospital Stay (HOSPITAL_COMMUNITY): Payer: Medicaid Other

## 2019-11-29 LAB — POCT I-STAT 7, (LYTES, BLD GAS, ICA,H+H)
Acid-Base Excess: 6 mmol/L — ABNORMAL HIGH (ref 0.0–2.0)
Bicarbonate: 30.3 mmol/L — ABNORMAL HIGH (ref 20.0–28.0)
Calcium, Ion: 1.06 mmol/L — ABNORMAL LOW (ref 1.15–1.40)
HCT: 38 % — ABNORMAL LOW (ref 39.0–52.0)
Hemoglobin: 12.9 g/dL — ABNORMAL LOW (ref 13.0–17.0)
O2 Saturation: 77 %
Patient temperature: 35
Potassium: 4.2 mmol/L (ref 3.5–5.1)
Sodium: 136 mmol/L (ref 135–145)
TCO2: 31 mmol/L (ref 22–32)
pCO2 arterial: 37 mmHg (ref 32.0–48.0)
pH, Arterial: 7.513 — ABNORMAL HIGH (ref 7.350–7.450)
pO2, Arterial: 34 mmHg — CL (ref 83.0–108.0)

## 2019-11-29 LAB — COMPREHENSIVE METABOLIC PANEL
ALT: 87 U/L — ABNORMAL HIGH (ref 0–44)
AST: 53 U/L — ABNORMAL HIGH (ref 15–41)
Albumin: 2.1 g/dL — ABNORMAL LOW (ref 3.5–5.0)
Alkaline Phosphatase: 163 U/L — ABNORMAL HIGH (ref 38–126)
Anion gap: 11 (ref 5–15)
BUN: 34 mg/dL — ABNORMAL HIGH (ref 6–20)
CO2: 26 mmol/L (ref 22–32)
Calcium: 7.5 mg/dL — ABNORMAL LOW (ref 8.9–10.3)
Chloride: 98 mmol/L (ref 98–111)
Creatinine, Ser: 1.09 mg/dL (ref 0.61–1.24)
GFR calc Af Amer: >60 mL/min (ref >60)
GFR calc non Af Amer: >60 mL/min (ref >60)
Glucose, Bld: 89 mg/dL (ref 70–99)
Potassium: 4.1 mmol/L (ref 3.5–5.1)
Sodium: 135 mmol/L (ref 135–145)
Total Bilirubin: 0.3 mg/dL (ref 0.3–1.2)
Total Protein: 6.7 g/dL (ref 6.5–8.1)

## 2019-11-29 LAB — FERRITIN: Ferritin: 1777 ng/mL — ABNORMAL HIGH (ref 24–336)

## 2019-11-29 LAB — CBC WITH DIFFERENTIAL/PLATELET
Abs Immature Granulocytes: 0.87 10*3/uL — ABNORMAL HIGH (ref 0.00–0.07)
Basophils Absolute: 0.1 10*3/uL (ref 0.0–0.1)
Basophils Relative: 0 %
Eosinophils Absolute: 0.2 10*3/uL (ref 0.0–0.5)
Eosinophils Relative: 1 %
HCT: 33 % — ABNORMAL LOW (ref 39.0–52.0)
Hemoglobin: 10.4 g/dL — ABNORMAL LOW (ref 13.0–17.0)
Immature Granulocytes: 5 %
Lymphocytes Relative: 15 %
Lymphs Abs: 2.7 10*3/uL (ref 0.7–4.0)
MCH: 25.9 pg — ABNORMAL LOW (ref 26.0–34.0)
MCHC: 31.5 g/dL (ref 30.0–36.0)
MCV: 82.1 fL (ref 80.0–100.0)
Monocytes Absolute: 1.2 10*3/uL — ABNORMAL HIGH (ref 0.1–1.0)
Monocytes Relative: 7 %
Neutro Abs: 12.5 10*3/uL — ABNORMAL HIGH (ref 1.7–7.7)
Neutrophils Relative %: 72 %
Platelets: 206 10*3/uL (ref 150–400)
RBC: 4.02 MIL/uL — ABNORMAL LOW (ref 4.22–5.81)
RDW: 16.1 % — ABNORMAL HIGH (ref 11.5–15.5)
WBC: 17.4 10*3/uL — ABNORMAL HIGH (ref 4.0–10.5)
nRBC: 0.2 % (ref 0.0–0.2)

## 2019-11-29 LAB — GLUCOSE, CAPILLARY
Glucose-Capillary: 69 mg/dL — ABNORMAL LOW (ref 70–99)
Glucose-Capillary: 107 mg/dL — ABNORMAL HIGH (ref 70–99)
Glucose-Capillary: 170 mg/dL — ABNORMAL HIGH (ref 70–99)
Glucose-Capillary: 182 mg/dL — ABNORMAL HIGH (ref 70–99)
Glucose-Capillary: 129 mg/dL — ABNORMAL HIGH (ref 70–99)

## 2019-11-29 LAB — C-REACTIVE PROTEIN: CRP: 13 mg/dL — ABNORMAL HIGH (ref <1.0)

## 2019-11-29 LAB — D-DIMER, QUANTITATIVE: D-Dimer, Quant: 3.6 ug/mL-FEU — ABNORMAL HIGH (ref 0.00–0.50)

## 2019-11-29 LAB — HEPARIN LEVEL (UNFRACTIONATED): Heparin Unfractionated: 0.32 IU/mL (ref 0.30–0.70)

## 2019-11-29 MED ORDER — PROPOFOL 10 MG/ML IV BOLUS: 2.0000 mg/kg | Freq: Once | INTRAVENOUS | Status: DC

## 2019-11-29 MED ORDER — BISACODYL 5 MG PO TBEC: 5.0000 mg | DELAYED_RELEASE_TABLET | Freq: Every day | ORAL | Status: DC | PRN

## 2019-11-29 MED ORDER — POLYETHYLENE GLYCOL 3350 17 G PO PACK
17.0000 g | PACK | Freq: Every day | ORAL | Status: DC | PRN
  Administered 2019-12-04: 17 g via ORAL
  Filled 2019-11-29: qty 1

## 2019-11-29 MED ORDER — SUCCINYLCHOLINE CHLORIDE 20 MG/ML IJ SOLN
1.5000 mg/kg | Freq: Once | INTRAMUSCULAR | Status: DC
  Filled 2019-11-29: qty 7.15

## 2019-11-29 MED ORDER — VECURONIUM BROMIDE 10 MG IV SOLR
INTRAVENOUS | Status: AC
  Filled 2019-11-29: qty 10

## 2019-11-29 MED ORDER — STERILE WATER FOR INJECTION IJ SOLN
INTRAMUSCULAR | Status: AC
  Filled 2019-11-29: qty 10

## 2019-11-29 MED ORDER — SUCCINYLCHOLINE CHLORIDE 200 MG/10ML IV SOSY: 143.0000 mg | PREFILLED_SYRINGE | Freq: Once | INTRAVENOUS | Status: DC

## 2019-11-29 MED ORDER — FENTANYL CITRATE (PF) 100 MCG/2ML IJ SOLN
INTRAMUSCULAR | Status: AC
  Filled 2019-11-29: qty 2

## 2019-11-29 MED ORDER — FUROSEMIDE 10 MG/ML IJ SOLN
40.0000 mg | Freq: Once | INTRAMUSCULAR | Status: AC
  Administered 2019-11-29: 40 mg via INTRAVENOUS
  Filled 2019-11-29: qty 4

## 2019-11-29 MED ORDER — PNEUMOCOCCAL VAC POLYVALENT 25 MCG/0.5ML IJ INJ
0.5000 mL | INJECTION | INTRAMUSCULAR | Status: AC
  Administered 2019-12-03: 0.5 mL via INTRAMUSCULAR
  Filled 2019-11-29: qty 0.5

## 2019-11-29 MED ORDER — MIDAZOLAM HCL 2 MG/2ML IJ SOLN
1.0000 mg | INTRAMUSCULAR | Status: DC | PRN
  Administered 2019-11-30: 2 mg via INTRAVENOUS
  Filled 2019-11-29: qty 2

## 2019-11-29 MED ORDER — ROCURONIUM BROMIDE 10 MG/ML (PF) SYRINGE
PREFILLED_SYRINGE | INTRAVENOUS | Status: AC
  Filled 2019-11-29: qty 10

## 2019-11-29 MED ORDER — BISACODYL 5 MG PO TBEC
5.0000 mg | DELAYED_RELEASE_TABLET | Freq: Every day | ORAL | Status: DC | PRN
  Administered 2019-12-04: 5 mg via ORAL
  Filled 2019-11-29: qty 1

## 2019-11-29 MED ORDER — INSULIN DETEMIR 100 UNIT/ML ~~LOC~~ SOLN
10.0000 [IU] | Freq: Every day | SUBCUTANEOUS | Status: DC
  Administered 2019-11-30 – 2019-12-02 (×3): 10 [IU] via SUBCUTANEOUS
  Filled 2019-11-29 (×3): qty 0.1

## 2019-11-29 MED ORDER — ENOXAPARIN SODIUM 100 MG/ML ~~LOC~~ SOLN
100.0000 mg | Freq: Two times a day (BID) | SUBCUTANEOUS | Status: DC
  Administered 2019-11-29 – 2019-12-01 (×6): 100 mg via SUBCUTANEOUS
  Filled 2019-11-29 (×7): qty 1

## 2019-11-29 MED ORDER — PROPOFOL 10 MG/ML IV BOLUS
INTRAVENOUS | Status: AC
  Administered 2019-11-30: 5 ug/kg/min via INTRAVENOUS
  Filled 2019-11-29: qty 20

## 2019-11-29 MED ORDER — SUCCINYLCHOLINE CHLORIDE 200 MG/10ML IV SOSY
PREFILLED_SYRINGE | INTRAVENOUS | Status: AC
  Filled 2019-11-29: qty 10

## 2019-11-29 MED ORDER — MIDAZOLAM HCL 2 MG/2ML IJ SOLN
INTRAMUSCULAR | Status: AC
  Filled 2019-11-29: qty 4

## 2019-11-29 MED ORDER — ETOMIDATE 2 MG/ML IV SOLN
INTRAVENOUS | Status: AC
  Filled 2019-11-29: qty 20

## 2019-11-29 MED ORDER — FENTANYL 2500MCG IN NS 250ML (10MCG/ML) PREMIX INFUSION
0.0000 ug/h | INTRAVENOUS | Status: DC
  Administered 2019-11-30: 150 ug/h via INTRAVENOUS
  Administered 2019-11-30: 25 ug/h via INTRAVENOUS
  Filled 2019-11-29 (×2): qty 250

## 2019-11-29 MED ORDER — PREDNISONE 20 MG PO TABS
30.0000 mg | ORAL_TABLET | Freq: Every day | ORAL | Status: DC
  Administered 2019-11-30: 30 mg via ORAL
  Filled 2019-11-29 (×2): qty 1

## 2019-11-29 MED ORDER — PROPOFOL 1000 MG/100ML IV EMUL
5.0000 ug/kg/min | INTRAVENOUS | Status: DC
  Administered 2019-11-30 (×3): 25 ug/kg/min via INTRAVENOUS
  Administered 2019-11-30: 50 ug/kg/min via INTRAVENOUS
  Administered 2019-12-01: 25 ug/kg/min via INTRAVENOUS
  Filled 2019-11-29 (×6): qty 100

## 2019-11-29 NOTE — Progress Notes (Signed)
Heparin gtt stopped per pharmacy orders. Pt stable at this time and sitting up to the side of bed in no ditress. Chest tube site WNL. Remains on 15L HFNC and non re breather. Will attempt to wean per MD request.

## 2019-11-29 NOTE — Progress Notes (Signed)
PCCM interval note  Chart reviewed, chest x-ray from 1/16 reviewed. I do not see any residual right-sided pneumothorax.  Pigtail catheter is in place with small amount of residual pleural fluid in the gutter.  He does have cardiomegaly and persistent left-sided infiltrates.  Chest tube output recorded at 280 cc over the last 24 hours (decreasing).  I think it would leave his chest tube to suction for now.  When his output gets below 50-100 cc per 24 hours then consider placing chest tube to waterseal and following chest x-ray for reaccumulation.  I will follow, plan to repeat chest x-ray on 1/17.  Levy Pupa, MD, PhD 11/29/2019, 11:13 AM  Pulmonary and Critical Care [phone] or if no answer [phone]

## 2019-11-29 NOTE — Plan of Care (Signed)
  Problem: Education: Goal: Knowledge of risk factors and measures for prevention of condition will improve Outcome: Progressing   Problem: Coping: Goal: Psychosocial and spiritual needs will be supported Outcome: Progressing   Problem: Respiratory: Goal: Will maintain a patent airway Outcome: Progressing   Problem: Respiratory: Goal: Complications related to the disease process, condition or treatment will be avoided or minimized Outcome: Progressing   

## 2019-11-29 NOTE — Progress Notes (Signed)
2325 Patient arrived to ICU.  2340 Patient continues to be in respiratory distress, tachypneic RR 30-40, using accessory muscles, unable to speak well due to breathing, O2 sats 70s-80s on 15L HF and 15L NRB.  2345 Dr. Warrick Parisian on e-link, order received to intubate, patient in aggreeance. Patient's wife Nathaniel Webb called and notified of patient status and current plan of care to intubate, CRNA called.  [id number] Anesthesiologist and CRNA at patient bedside.  0010 Patient intubated and placed on ventilator.  0200 Patient's wife Nathaniel Webb called and updated on patient status per her request, no questions at this time.

## 2019-11-29 NOTE — Progress Notes (Addendum)
Rapid response called, 2230 alarms sounding in room 9171 central patient Rubey laying on left side hob 40 degrees, with sob saturations 82 and dropping moved patient up in be, gave the inhaler 2 puffs patient on 15 liter non-rebreather, 15 HFNC note patient diminished on the lft lung and coarse and diminished on the right patient continues labored breathing and new he has some pitting edema bilaterally ankles and feet 1+. Dr. Karren Burly called at 2239 - new orders stat chest xray, and heated high flow and 40 of Lasix IV patient's saturations dropped 60%, patients bp up and down 188/155 -209/106, heart rate is 100-104 st, patient continued to struggle breathing even with heated HFNC from 2239 til 2305 patient transferred to ICU at 2315, note ICU RN taking over patient care given report at the bedside [facility] RN.

## 2019-11-29 NOTE — Progress Notes (Signed)
Pt's wife , Margaretha Glassing, was called and updated. Plan of care reviewed and wife had no concerns at this time.

## 2019-11-29 NOTE — Progress Notes (Signed)
PROGRESS NOTE    Nathaniel Webb  IRJ:[id number] DOB: 19-Feb-1966 DOA: 11/11/2019 PCP: Langston Reusing, NP    Brief Narrative:  54 year old male who was transferred from [facility] for management of acute hypoxic respiratory failure,due to SARS COVID-19 viral pneumonia. He does have history of type II that is mellitus, hypertension, dyslipidemia and obesity. He reported 7 days of worsening and progressive dyspnea. December 23 he was diagnosed with COVID-19 and managed as an outpatient. He was admitted to the hospital on December 27 due to persistent symptoms and severe hypoxemia, oxygen saturation 50%.Transferred to [place] December 29,his lungs had bibasilar R[place], heart S1-S2 present rhythmic, abdomen soft, no lower extremity edema. His initial chest radiograph had bilateral interstitial infiltrates, right upper lobe, right lower lobe, left upper lobe.  Patient was admitted to the hospital working diagnosis of acute hypoxic respiratory failure due to SARS COVID-19 viral pneumonia.  Patient has been treated with steroids, remdesivir and Actemra. His viral pneumonia has been complicated byMSSA bacteremia,right hydropneumothorax,bacterial over infectionand empyema. Patient underwent thoracentesis January 12(Fluid cell count 117,299, turbid, 85% PMN/ culture positive for Staph aureus).  11/26/19 with worsening respiratory distress and hypoxemia,repeatCT chest personally reviewed, with worsening pneumothorax at the right apex. Patient underwent emergent placement of chest tubewith improvement of oxygenation and symptoms (transferred to the ICU).  Patient with improved symptoms after chest tube placement, continue to have high oxygen requirements and follow up imaging with persistent pneumothorax.   Chest tube with persistent air leak.    Assessment & Plan:   Principal Problem:   Acute respiratory failure with hypoxemia (HCC) Active Problems:   Type 2  diabetes, controlled, with peripheral neuropathy (HCC)   Hypertension   Morbid obesity ([place])   Pneumonia due to COVID-19 virus   Diabetes mellitus type 2, uncontrolled, with complications ([place])   Acute pulmonary embolus (HCC)   Demand ischemia (HCC)   Constipation   Loculated pleural effusion   MSSA bacteremia   Empyema (HCC)   Hydropneumothorax   ARDS (adult respiratory distress syndrome) (HCC)   Pneumonia of right lung due to methicillin susceptible Staphylococcus aureus (MSSA) ([place])   1. Acute hypoxemic respiratory failure due to SARS COVID 19 viral pneumonia, complicated with MSSA pneumonia/ bacteremia, right empyema and right pneumothorax.  Severe COVID 19 viral lung injury.   spRemdesivir #5/5,tocilizumab#1.  Blood cultures on 1/10, positive for Staphylococcus aureus/ MSSA. Fluid culture positive forStaph aureus.  RR: 24  Pulse oxymetry: 94%  Fi02: 10 L/ min HFNC and NRBM  COVID-19 Labs  Recent Labs    11/27/19 0500 11/28/19 0310 11/29/19 0126  DDIMER 2.24* 3.59* 3.60*  FERRITIN 2,001* 2,274* 1,777*  CRP 20.9* 14.9* 13.0*    No results found for: SARSCOV2NAA   Inflammatory markers continue to be elevated. Follow up chest film personally reviewed with re-expanded right lung, no evident pneumothorax. Oxygen requirements down to 10 L/ min but still on HFNC and NRBM.  WBC continue to trend down to 17, continue with  IV cefazolin (11/27/19). #3.    Right chest tube to suction 20 cm H20, continue decreasing output today down to 280 cc of serosanguinous fluid over last 24 H. Continue to have an air leak.   Slow taper of steroids, will change to oral prednisone 30 mg daily #3. Will do a trial of furosemide for non cardiogenic pulmonary edema, will follow on urine output. Follow chest film in am, continue to encourage out of bed and physical/ occupational therapy.   Continue bronchodilators and  antitussive agents.   Patient continue to be at a high risk  for worsening respiratory failure.   2. Paroxysmal atrial fibrillation. converted to sinus rhythm. Echocardiogram from 01/09 has a preserved LV and RV systolic function. Tolerating well metoprolol 50 mg po bid, will change anticoagulation to enoxaparin to limit IVs and volume overload.   3. Acute pulmonary embolism.presureserved LV and RV function. Consult pharmacy for transition to enoxaparin, therapeutic dosing.   4. Acute on chronic diastolic heart failure. sp diuresis with furosemide. Trail of diuresis today, follow on response to 40 mg of Iv furosemide.   5. HTN.Continue metoprolol.   6. AKI with hyperkalemia and hyponatremia. Serum cr today at 1,0 with K at 4,1 and serum bicarbonate at 26, will follow on renal panel in am. Trial of diuresis.   6. Uncontrolled T2DM ( Hgb A1c 7.3), complicated with steroid induced hyperglycemia. Fasting glucose this am 89. Continue with lucose cover and monitoring with sliding scale. will decrease basal insulinto 10 daily for now.   DVT prophylaxis:heparinIV Code Status:full Family Communication:no family at the bedside. Disposition Plan/ discharge barriers:Patient continue to be at high risk for worsening respiratory and cardiovascular decompensation., high oxygen requirements.   Consultants:  Pulmonary   ID  Procedures:  01/12 thoracentesis R   01/13 right chest tube  Antimicrobials: Initially onCefepime, Metronidazole, and Linezolidchanged to Cefazolin on 01.14.21   Subjective: Patient continue to have dyspnea, worse with movement, no nausea or vomiting, chest pain on the right is controlled. Continue on high flow nasal cannula and NRBM.   Objective: Vitals:   11/28/19 2026 11/28/19 2027 11/28/19 2100 11/29/19 0536  BP: (!) 143/103 (!) 143/103  (!) 126/91  Pulse: 88 89  88  Resp: (!) 26 (!) 32 (!) 22 (!) 24  Temp: 98.8 F (37.1 C)   98.1 F (36.7 C)  TempSrc: Axillary   Oral  SpO2: 96% 95%  94%    Weight:      Height:        Intake/Output Summary (Last 24 hours) at 11/29/2019 0800 Last data filed at 11/29/2019 0600 Gross per 24 hour  Intake 1854.26 ml  Output 1831 ml  Net 23.26 ml   Filed Weights   11/26/19 0500 11/27/19 0500 11/28/19 0616  Weight: 91.3 kg 95.5 kg 95.3 kg    Examination:   General: Not in pain, positive dyspnea and deconditioning.  Neurology: Awake and alert, non focal  E ENT: mild pallor, no icterus, oral mucosa moist Cardiovascular: No JVD. S1-S2 present, rhythmic, no gallops, rubs, or murmurs. +/ ++ bilateral lower extremity edema. Pulmonary: positive breath sounds bilaterally.  Gastrointestinal. Abdomen with no organomegaly, non tender, no rebound or guarding Skin. No rashes Musculoskeletal: no joint deformities  Right chest tube in place, positive air leak, serosanguinous drainage.    Data Reviewed: I have personally reviewed following labs and imaging studies  CBC: Recent Labs  Lab 11/25/19 1240 11/26/19 0326 11/27/19 0500 11/28/19 0310 11/29/19 0126  WBC 26.6* 26.9* 20.1* 18.0* 17.4*  NEUTROABS 23.2* 21.5* 15.4* 13.3* 12.5*  HGB 12.3* 11.9* 10.9* 11.5* 10.4*  HCT 37.6* 36.5* 33.8* 35.8* 33.0*  MCV 79.3* 81.3 82.0 82.1 82.1  PLT 208 200 191 192 206   Basic Metabolic Panel: Recent Labs  Lab 11/23/19 0429 11/23/19 0429 11/24/19 0555 11/24/19 0555 11/25/19 0407 11/25/19 1240 11/26/19 0326 11/27/19 0500 11/28/19 0310 11/29/19 0126  NA 128*   < > 130*   < >  --  138 137 139 138 135  K 5.3*   < > 5.2*   < >  --  4.1 4.2 4.1 4.7 4.1  CL 87*   < > 92*   < >  --  96* 94* 99 100 98  CO2 27   < > 24   < >  --  28 30 29 26 26   GLUCOSE 179*   < > 189*   < >  --  205* 169* 93 113* 89  BUN 50*   < > 63*   < >  --  62* 56* 45* 36* 34*  CREATININE 1.28*   < > 1.31*   < >  --  1.42* 1.46* 1.42* 1.22 1.09  CALCIUM 8.3*   < > 8.2*   < >  --  7.9* 7.8* 7.7* 7.9* 7.5*  MG 3.5*  --  4.2*  --  4.4*  --  4.2*  --   --   --   PHOS 4.7*  --   5.0*  --  5.7*  --  4.5  --   --   --    < > = values in this interval not displayed.   GFR: Estimated Creatinine Clearance: 84.1 mL/min (by C-G formula based on SCr of 1.09 mg/dL). Liver Function Tests: Recent Labs  Lab 11/25/19 1240 11/26/19 0326 11/27/19 0500 11/28/19 0310 11/29/19 0126  AST 36 49* 59* 50* 53*  ALT 88* 82* 113* 101* 87*  ALKPHOS 122 137* 143* 152* 163*  BILITOT 0.8 0.9 0.4 0.4 0.3  PROT 7.1  7.1 7.1 6.7 7.0 6.7  ALBUMIN 2.5* 2.5* 2.3* 2.3* 2.1*   No results for input(s): LIPASE, AMYLASE in the last 168 hours. No results for input(s): AMMONIA in the last 168 hours. Coagulation Profile: Recent Labs  Lab 11/23/19 0429 11/24/19 0555 11/25/19 0407 11/26/19 0326  INR 1.7* 2.1* 1.3* 1.3*   Cardiac Enzymes: No results for input(s): CKTOTAL, CKMB, CKMBINDEX, TROPONINI in the last 168 hours. BNP (last 3 results) No results for input(s): PROBNP in the last 8760 hours. HbA1C: No results for input(s): HGBA1C in the last 72 hours. CBG: Recent Labs  Lab 11/27/19 2059 11/28/19 0735 11/28/19 1144 11/28/19 1545 11/28/19 2110  GLUCAP 136* 107* 217* 324* 258*   Lipid Profile: No results for input(s): CHOL, HDL, LDLCALC, TRIG, CHOLHDL, LDLDIRECT in the last 72 hours. Thyroid Function Tests: No results for input(s): TSH, T4TOTAL, FREET4, T3FREE, THYROIDAB in the last 72 hours. Anemia Panel: Recent Labs    11/28/19 0310 11/29/19 0126  FERRITIN 2,274* 1,777*      Radiology Studies: I have reviewed all of the imaging during this hospital visit personally     Scheduled Meds: . vitamin C  500 mg Oral Daily  . bisacodyl  5 mg Oral Daily  . Chlorhexidine Gluconate Cloth  6 each Topical Daily  . chlorpheniramine-HYDROcodone  5 mL Oral Q12H  . insulin aspart  0-20 Units Subcutaneous TID WC  . insulin detemir  10 Units Subcutaneous BID  . methylPREDNISolone (SOLU-MEDROL) injection  30 mg Intravenous Daily  . metoprolol tartrate  50 mg Oral BID  .  polyethylene glycol  17 g Oral Daily  . sodium chloride flush  3 mL Intravenous Q12H  . zinc sulfate  220 mg Oral Daily   Continuous Infusions: . sodium chloride    .  ceFAZolin (ANCEF) IV 2 g (11/29/19 0544)  . heparin 1,400 Units/hr (11/28/19 1724)     LOS: 18 days  Kaylanni Ezelle Gerome Apley, MD

## 2019-11-29 NOTE — Plan of Care (Signed)
  Problem: Education: Goal: Knowledge of risk factors and measures for prevention of condition will improve Outcome: Progressing   Problem: Coping: Goal: Psychosocial and spiritual needs will be supported Outcome: Progressing   Problem: Respiratory: Goal: Will maintain a patent airway Outcome: Progressing Goal: Complications related to the disease process, condition or treatment will be avoided or minimized Outcome: Progressing   

## 2019-11-29 NOTE — Progress Notes (Signed)
Pt's Daughter, Lowella Fairy, was called and updated. Questions about discharging planning addressed as well as pt's current oxygen therapy. Family has no further concerns at this time.

## 2019-11-29 NOTE — Progress Notes (Signed)
ANTICOAGULATION CONSULT NOTE - Follow Up Consult  Pharmacy Consult for Lovenox Indication: PE  No Known Allergies  Patient Measurements: Height: 5\' 5"  (165.1 cm) Weight: 210 lb 1.6 oz (95.3 kg) IBW/kg (Calculated) : 61.5 Heparin Dosing Weight: 85 kg  Vital Signs: Temp: 98.1 F (36.7 C) (01/16 0800) Temp Source: Oral (01/16 0800) BP: 121/83 (01/16 0800) Pulse Rate: 78 (01/16 0800)  Labs: Recent Labs    11/26/19 2134 11/26/19 2134 11/27/19 0500 11/27/19 0500 11/28/19 0310 11/29/19 0126  HGB  --   --  10.9*   < > 11.5* 10.4*  HCT  --   --  33.8*  --  35.8* 33.0*  PLT  --   --  191  --  192 206  APTT 69*  --  55*  --  49*  --   HEPARINUNFRC 0.49   < > 0.51  --  0.33 0.32  CREATININE  --   --  1.42*  --  1.22 1.09   < > = values in this interval not displayed.    Estimated Creatinine Clearance: 84.1 mL/min (by C-G formula based on SCr of 1.09 mg/dL).   Medications:  Scheduled:  . vitamin C  500 mg Oral Daily  . bisacodyl  5 mg Oral Daily  . Chlorhexidine Gluconate Cloth  6 each Topical Daily  . chlorpheniramine-HYDROcodone  5 mL Oral Q12H  . furosemide  40 mg Intravenous Once  . insulin aspart  0-20 Units Subcutaneous TID WC  . insulin detemir  10 Units Subcutaneous BID  . methylPREDNISolone (SOLU-MEDROL) injection  30 mg Intravenous Daily  . metoprolol tartrate  50 mg Oral BID  . [START ON 11/30/2019] pneumococcal 23 valent vaccine  0.5 mL Intramuscular Tomorrow-1000  . polyethylene glycol  17 g Oral Daily  . sodium chloride flush  3 mL Intravenous Q12H  . zinc sulfate  220 mg Oral Daily   Infusions:  . sodium chloride    .  ceFAZolin (ANCEF) IV 2 g (11/29/19 0544)  . heparin 1,400 Units/hr (11/29/19 1026)    Assessment: 54 yo male with COVID pneumonitis started on Eliquis for PE, held for thoracentesis 1/12 and chest tube placement on 1/13.  Pharmacy consulted to start IV heparin around the chest tube placement.  Now consulted to transition to  Lovenox. Most recent heparin level therapeutic at 0.32 on 1/16 AM. SCr 1.09 with CrCl > 30 ml/min CBC: Hgb low/stable at 10.4, Plt 206  Goal of Therapy:  Anti-Xa level 0.6-1 units/ml 4hrs after LMWH dose given Monitor platelets by anticoagulation protocol: Yes   Plan:  D/C heparin drip, one hour later, begin Lovenox Lovenox 1 mg/kg (100mg ) [place] q12h Follow up renal function, CBC Follow up transition back to long-term anticoagulation.  2/16 PharmD, BCPS Clinical pharmacist phone 7am- 5pm: [phone] 11/29/2019 10:36 AM

## 2019-11-29 NOTE — Progress Notes (Signed)
PHYSICAL THERAPY EVALUATION  CLINICAL IMPRESSION: Pt was living home with spouse in apartment states that was independent prior to this illness. He has been ill since 12/29. This am he reports fatigue but is agreeable to attempting mobility, He was able to complete bed mob and sit edge of bed (aslo return to supine at end) with set up and line management assist. He was able to sit unsupported edge of bed x 20 mins, working on pursed lip breathing and also core strength and balance. Was able to stand with min/min guard assist and hand held, took few steps at edge of bed, with this pt was noted to have desat in 02 saturation to min 86% but also noted increase in RR to 40s. Pt needed cues for pursed lip breathing and also to slow breathing taking deeper breaths. Pt was on 15L HFNC and 10 L NRB this am. Overall pt did very well with therapy this am. Pt will greatly benefit from continued PT tx while in hospital, to address deficits in balance and coordination and also activity tolerance in order to increase independence and safety for d/c.    11/29/19 1000  PT Visit Information  Last PT Received On 11/29/19  Assistance Needed +1  History of Present Illness 54 y/o male w/ hx of Obesity, low HDL, HTN, DM, presented to [facility] (12/29) with approximately 1 week history of gradually worsening shortness of breath.  He was found to have acute hypoxic respiratory failure and was requiring high flow oxygen to maintain O2 saturations.  Chest x-ray was typical of Covid pneumonitis. Pt dx w/ COVID 12/23 and dc home w/ spouse. 1/10 CXR showed ARDS bilateral increasing opacification; respiratory status worsened; concerns for aspiration. Acute PE; loculated right pleural effusion; 1/11 CTS consult, recs chest tube. 1/12 CT chest with densely consolidated right lung, air fluid level/ hydropneumothorax. Thoracentesis 1/12; pleural fluid sampled and concerning for empyema.  1/13 emergently transferred to ICU for worsening hypoxia;  CT showed worsening right hydropneumothorax with loculations in pleura. Emergent right chest tube placed.   Precautions  Precautions Fall  Restrictions  Weight Bearing Restrictions No  Home Living  Family/patient expects to be discharged to: Private residence  Living Arrangements Spouse/significant other  Available Help at Discharge Family  Type of Home Apartment  Home Access Level entry  Home Layout One level  Bathroom Shower/Tub Tub/shower unit  Home Equipment None  Prior Function  Level of Independence Independent  Communication  Communication No difficulties  Pain Assessment  Pain Assessment No/denies pain  Cognition  Arousal/Alertness Awake/alert  Behavior During Therapy WFL for tasks assessed/performed  Overall Cognitive Status Within Functional Limits for tasks assessed  Upper Extremity Assessment  Upper Extremity Assessment Generalized weakness  Lower Extremity Assessment  Lower Extremity Assessment Generalized weakness  Bed Mobility  Overal bed mobility Needs Assistance  Bed Mobility Supine to Sit;Sit to Supine  Supine to sit Supervision  Sit to supine Supervision  General bed mobility comments needs set up and line management help  Transfers  Overall transfer level Needs assistance  Equipment used 1 person hand held assist  Transfers Sit to/from Stand  Sit to Stand Min assist  Ambulation/Gait  General Gait Details only took few steps at edge of bed as pt c/o fatigue this am  Balance  Overall balance assessment Mild deficits observed, not formally tested  General Comments  General comments (skin integrity, edema, etc.) Pt on 15L/min FNC and 10L/min NRB able to complete supine to sit and maintain sats  in 90s, with standing and steppping at edge of bed Pt desat to 86% and noted increased RR to 40s needing cues for pursed lip breathing.  PT - End of Session  Equipment Utilized During Treatment Oxygen  Activity Tolerance Patient limited by fatigue;Patient limited  by lethargy;Treatment limited secondary to medical complications (Comment)  Patient left in bed;with call bell/phone within reach  Nurse Communication Mobility status  PT Assessment  PT Recommendation/Assessment Patient needs continued PT services  PT Visit Diagnosis Unsteadiness on feet (R26.81);Muscle weakness (generalized) (M62.81)  PT Problem List Decreased strength;Decreased activity tolerance;Decreased balance;Decreased mobility;Decreased coordination;Decreased knowledge of use of DME;Decreased safety awareness  Barriers to Discharge Comments lives home in appartment with spouse   PT Plan  PT Frequency (ACUTE ONLY) Min 3X/week  PT Treatment/Interventions (ACUTE ONLY) Gait training;Functional mobility training;Therapeutic activities;Therapeutic exercise;Balance training;Neuromuscular re-education;Patient/family education  AM-PAC PT "6 Clicks" Mobility Outcome Measure (Version 2)  Help needed turning from your back to your side while in a flat bed without using bedrails? 4  Help needed moving from lying on your back to sitting on the side of a flat bed without using bedrails? 3  Help needed moving to and from a bed to a chair (including a wheelchair)? 3  Help needed standing up from a chair using your arms (e.g., wheelchair or bedside chair)? 3  Help needed to walk in hospital room? 2  Help needed climbing 3-5 steps with a railing?  2  6 Click Score 17  Consider Recommendation of Discharge To: Home with [facility]  PT Recommendation  Recommendations for Other Services OT consult  Follow Up Recommendations Supervision for mobility/OOB  PT equipment Other (comment) (TBD possibly 02)  Individuals Consulted  Consulted and Agree with Results and Recommendations Patient  Acute Rehab PT Goals  Patient Stated Goal to get better  PT Goal Formulation With patient  Time For Goal Achievement 12/13/19  Potential to Achieve Goals Fair  PT Time Calculation  PT Start Time (ACUTE ONLY) 0950  PT Stop  Time (ACUTE ONLY) 1029  PT Time Calculation (min) (ACUTE ONLY) 39 min  PT General Charges  $$ ACUTE PT VISIT 1 Visit  PT Evaluation  $PT Eval Moderate Complexity 1 Mod  PT Treatments  $Therapeutic Activity 23-37 mins  Written Expression  Dominant Hand Right   Horald Chestnut, PT

## 2019-11-29 NOTE — Progress Notes (Signed)
ANTICOAGULATION CONSULT NOTE - Follow Up Consult  Pharmacy Consult for Heparin Indication: PE  No Known Allergies  Patient Measurements: Height: 5\' 5"  (165.1 cm) Weight: 210 lb 1.6 oz (95.3 kg) IBW/kg (Calculated) : 61.5 Heparin Dosing Weight: 85 kg  Vital Signs: Temp: 98.8 F (37.1 C) (01/15 2026) Temp Source: Axillary (01/15 2026) BP: 143/103 (01/15 2027) Pulse Rate: 89 (01/15 2027)  Labs: Recent Labs    11/26/19 2134 11/26/19 2134 11/27/19 0500 11/27/19 0500 11/28/19 0310 11/29/19 0126  HGB  --   --  10.9*   < > 11.5* 10.4*  HCT  --   --  33.8*  --  35.8* 33.0*  PLT  --   --  191  --  192 206  APTT 69*  --  55*  --  49*  --   HEPARINUNFRC 0.49   < > 0.51  --  0.33 0.32  CREATININE  --   --  1.42*  --  1.22 1.09   < > = values in this interval not displayed.    Estimated Creatinine Clearance: 84.1 mL/min (by C-G formula based on SCr of 1.09 mg/dL).  Assessment: 54 yo male with COVID pneumonitis started on Eliquis for PE, held for thoracentesis 1/12 and chest tube placement on 1/13.  Pharmacy is consulted to start IV heparin after the chest tube was placed..    Most recent dose of Eliquis 5mg  on 1/11 at 08:14 Heparin Level 0.51, therapeutic on heparin at 1400 units/hr.  Last apixaban ~ 72 hours ago, and correlating with aPTT.  Will use HL for dosing changes. SCr 1.4 CBC: Hgb low/stable at 10.9, Plt 191 Chest tube placed 1/13 without noted complications.    11/29/19 0500  Heparin level: 0.32 IU/mL, within therapeutic goal range RN reports no bleeding complications or issues with infusion site H/H low stable and  platelets WNL D-Dimer: 3.69mcg/mL-FEU    Goal of Therapy:  Heparin level 0.3-0.7 units/ml  Monitor platelets by anticoagulation protocol: Yes   Plan:   Continue heparin  infusion rate  at 1400 units/hr  Daily  heparin level  and CBC  Continue to monitor H&H and platelets  Follow up plans to resume apixaban when stable.   12/01/19,  Pharm. D. Clinical Pharmacist 11/29/2019 5:02 AM

## 2019-11-30 ENCOUNTER — Inpatient Hospital Stay (HOSPITAL_COMMUNITY): Payer: Medicaid Other

## 2019-11-30 ENCOUNTER — Inpatient Hospital Stay (HOSPITAL_COMMUNITY): Payer: Medicaid Other | Admitting: Registered Nurse

## 2019-11-30 LAB — CBC WITH DIFFERENTIAL/PLATELET
Abs Immature Granulocytes: 0.9 10*3/uL — ABNORMAL HIGH (ref 0.00–0.07)
Basophils Absolute: 0.1 10*3/uL (ref 0.0–0.1)
Basophils Relative: 0 %
Eosinophils Absolute: 0.1 10*3/uL (ref 0.0–0.5)
Eosinophils Relative: 1 %
HCT: 34 % — ABNORMAL LOW (ref 39.0–52.0)
Hemoglobin: 10.8 g/dL — ABNORMAL LOW (ref 13.0–17.0)
Immature Granulocytes: 5 %
Lymphocytes Relative: 12 %
Lymphs Abs: 2.3 10*3/uL (ref 0.7–4.0)
MCH: 26 pg (ref 26.0–34.0)
MCHC: 31.8 g/dL (ref 30.0–36.0)
MCV: 81.9 fL (ref 80.0–100.0)
Monocytes Absolute: 1.1 10*3/uL — ABNORMAL HIGH (ref 0.1–1.0)
Monocytes Relative: 6 %
Neutro Abs: 14.9 10*3/uL — ABNORMAL HIGH (ref 1.7–7.7)
Neutrophils Relative %: 76 %
Platelets: 226 10*3/uL (ref 150–400)
RBC: 4.15 MIL/uL — ABNORMAL LOW (ref 4.22–5.81)
RDW: 15.9 % — ABNORMAL HIGH (ref 11.5–15.5)
WBC: 19.3 10*3/uL — ABNORMAL HIGH (ref 4.0–10.5)
nRBC: 0.3 % — ABNORMAL HIGH (ref 0.0–0.2)

## 2019-11-30 LAB — POCT I-STAT 7, (LYTES, BLD GAS, ICA,H+H)
Acid-Base Excess: 4 mmol/L — ABNORMAL HIGH (ref 0.0–2.0)
Acid-Base Excess: 6 mmol/L — ABNORMAL HIGH (ref 0.0–2.0)
Bicarbonate: 29.6 mmol/L — ABNORMAL HIGH (ref 20.0–28.0)
Bicarbonate: 30.8 mmol/L — ABNORMAL HIGH (ref 20.0–28.0)
Calcium, Ion: 1.02 mmol/L — ABNORMAL LOW (ref 1.15–1.40)
Calcium, Ion: 1.07 mmol/L — ABNORMAL LOW (ref 1.15–1.40)
HCT: 36 % — ABNORMAL LOW (ref 39.0–52.0)
HCT: 34 % — ABNORMAL LOW (ref 39.0–52.0)
Hemoglobin: 12.2 g/dL — ABNORMAL LOW (ref 13.0–17.0)
Hemoglobin: 11.6 g/dL — ABNORMAL LOW (ref 13.0–17.0)
O2 Saturation: 96 %
O2 Saturation: 98 %
Patient temperature: 38.3
Patient temperature: 98
Potassium: 4.5 mmol/L (ref 3.5–5.1)
Potassium: 4.3 mmol/L (ref 3.5–5.1)
Sodium: 135 mmol/L (ref 135–145)
Sodium: 137 mmol/L (ref 135–145)
TCO2: 31 mmol/L (ref 22–32)
TCO2: 32 mmol/L (ref 22–32)
pCO2 arterial: 51.3 mmHg — ABNORMAL HIGH (ref 32.0–48.0)
pCO2 arterial: 44.6 mmHg (ref 32.0–48.0)
pH, Arterial: 7.375 (ref 7.350–7.450)
pH, Arterial: 7.446 (ref 7.350–7.450)
pO2, Arterial: 94 mmHg (ref 83.0–108.0)
pO2, Arterial: 100 mmHg (ref 83.0–108.0)

## 2019-11-30 LAB — COMPREHENSIVE METABOLIC PANEL
ALT: 101 U/L — ABNORMAL HIGH (ref 0–44)
AST: 89 U/L — ABNORMAL HIGH (ref 15–41)
Albumin: 2.2 g/dL — ABNORMAL LOW (ref 3.5–5.0)
Alkaline Phosphatase: 183 U/L — ABNORMAL HIGH (ref 38–126)
Anion gap: 13 (ref 5–15)
BUN: 38 mg/dL — ABNORMAL HIGH (ref 6–20)
CO2: 27 mmol/L (ref 22–32)
Calcium: 7.9 mg/dL — ABNORMAL LOW (ref 8.9–10.3)
Chloride: 97 mmol/L — ABNORMAL LOW (ref 98–111)
Creatinine, Ser: 1.3 mg/dL — ABNORMAL HIGH (ref 0.61–1.24)
GFR calc Af Amer: >60 mL/min (ref >60)
GFR calc non Af Amer: >60 mL/min (ref >60)
Glucose, Bld: 156 mg/dL — ABNORMAL HIGH (ref 70–99)
Potassium: 4.2 mmol/L (ref 3.5–5.1)
Sodium: 137 mmol/L (ref 135–145)
Total Bilirubin: 0.3 mg/dL (ref 0.3–1.2)
Total Protein: 6.9 g/dL (ref 6.5–8.1)

## 2019-11-30 LAB — GLUCOSE, CAPILLARY
Glucose-Capillary: 135 mg/dL — ABNORMAL HIGH (ref 70–99)
Glucose-Capillary: 158 mg/dL — ABNORMAL HIGH (ref 70–99)
Glucose-Capillary: 179 mg/dL — ABNORMAL HIGH (ref 70–99)
Glucose-Capillary: 152 mg/dL — ABNORMAL HIGH (ref 70–99)

## 2019-11-30 LAB — C-REACTIVE PROTEIN: CRP: 9.7 mg/dL — ABNORMAL HIGH (ref <1.0)

## 2019-11-30 LAB — FERRITIN: Ferritin: 1819 ng/mL — ABNORMAL HIGH (ref 24–336)

## 2019-11-30 LAB — D-DIMER, QUANTITATIVE: D-Dimer, Quant: 6.15 ug/mL-FEU — ABNORMAL HIGH (ref 0.00–0.50)

## 2019-11-30 LAB — TRIGLYCERIDES: Triglycerides: 345 mg/dL — ABNORMAL HIGH (ref <150)

## 2019-11-30 MED ORDER — MIDAZOLAM HCL 2 MG/2ML IJ SOLN
1.0000 mg | INTRAMUSCULAR | Status: DC | PRN
  Administered 2019-11-30 – 2019-12-01 (×2): 2 mg via INTRAVENOUS
  Filled 2019-11-30 (×3): qty 2

## 2019-11-30 MED ORDER — METOPROLOL TARTRATE 25 MG/10 ML ORAL SUSPENSION
50.0000 mg | Freq: Two times a day (BID) | ORAL | Status: DC
  Administered 2019-12-01 – 2019-12-02 (×2): 50 mg
  Filled 2019-11-30 (×2): qty 20

## 2019-11-30 MED ORDER — CHLORHEXIDINE GLUCONATE 0.12% ORAL RINSE (MEDLINE KIT)
15.0000 mL | Freq: Two times a day (BID) | OROMUCOSAL | Status: DC
  Administered 2019-11-30 – 2019-12-01 (×3): 15 mL via OROMUCOSAL

## 2019-11-30 MED ORDER — PROPOFOL 10 MG/ML IV BOLUS
INTRAVENOUS | Status: DC | PRN
  Administered 2019-11-30: 50 mg via INTRAVENOUS

## 2019-11-30 MED ORDER — ZINC SULFATE 220 (50 ZN) MG PO CAPS
220.0000 mg | ORAL_CAPSULE | Freq: Every day | ORAL | Status: DC
  Administered 2019-12-01 – 2019-12-02 (×2): 220 mg
  Filled 2019-11-30 (×2): qty 1

## 2019-11-30 MED ORDER — ETOMIDATE 2 MG/ML IV SOLN
INTRAVENOUS | Status: DC | PRN
  Administered 2019-11-30: 20 mg via INTRAVENOUS

## 2019-11-30 MED ORDER — PHENYLEPHRINE HCL-NACL 10-0.9 MG/250ML-% IV SOLN: 0.0000 ug/min | INTRAVENOUS | Status: DC

## 2019-11-30 MED ORDER — ORAL CARE MOUTH RINSE
15.0000 mL | OROMUCOSAL | Status: DC
  Administered 2019-11-30 – 2019-12-01 (×13): 15 mL via OROMUCOSAL

## 2019-11-30 MED ORDER — MIDAZOLAM HCL 2 MG/2ML IJ SOLN: 1.0000 mg | INTRAMUSCULAR | Status: DC | PRN

## 2019-11-30 MED ORDER — PANTOPRAZOLE SODIUM 40 MG PO PACK
40.0000 mg | PACK | Freq: Every day | ORAL | Status: DC
  Administered 2019-11-30 – 2019-12-01 (×2): 40 mg
  Filled 2019-11-30: qty 20

## 2019-11-30 MED ORDER — PREDNISONE 20 MG PO TABS
30.0000 mg | ORAL_TABLET | Freq: Every day | ORAL | Status: DC
  Administered 2019-12-01 – 2019-12-02 (×2): 30 mg
  Filled 2019-11-30 (×2): qty 1

## 2019-11-30 MED ORDER — VITAL HIGH PROTEIN PO LIQD
1000.0000 mL | ORAL | Status: DC
  Administered 2019-11-30: 1000 mL

## 2019-11-30 MED ORDER — SUCCINYLCHOLINE CHLORIDE 20 MG/ML IJ SOLN
INTRAMUSCULAR | Status: DC | PRN
  Administered 2019-11-30: 100 mg via INTRAVENOUS

## 2019-11-30 MED ORDER — ASCORBIC ACID 500 MG PO TABS
500.0000 mg | ORAL_TABLET | Freq: Every day | ORAL | Status: DC
  Administered 2019-12-01 – 2019-12-02 (×2): 500 mg
  Filled 2019-11-30 (×2): qty 1

## 2019-11-30 NOTE — Progress Notes (Signed)
Late Entry:  Rapid Response visit.  Patient labored, tachypneic, currently on 15L HF and NRB. Started HHFNC with no improvement. Patient transferred to ICU.

## 2019-11-30 NOTE — TOC Progression Note (Signed)
Transition of Care [facility]) - Progression Note    Patient Details  Name: ODIE EDMONDS MRN: [id number] Date of Birth: November 25, 1965  [facility] of [facility]) CM/SW Contact  Huston Foley Jacklynn Ganong, RN Phone Number: 11/30/2019, 12:59 PM  Clinical Narrative: [facility] Team continues to monitor patient. Remains on vent with right chest tube.       Expected Discharge Plan: Home/Self Care Barriers to Discharge: Continued Medical Work up  Expected Discharge Plan and Services Expected Discharge Plan: Home/Self Care In-house Referral: Clinical Social Work     Living arrangements for the past 2 months: Single Family Home                                       Social Determinants of Health (SDOH) Interventions    Readmission Risk Interventions Readmission Risk Prevention Plan 11/21/2019  Transportation Screening Complete  Medication Review (RN CM) Complete  Some recent data might be hidden

## 2019-11-30 NOTE — Progress Notes (Addendum)
Rapid Response Event Note  Overview:  Rapid response called over head for Nathaniel Webb after increase in respiratory distress and hypoxia.        Initial Focused Assessment: Labored breathing with intercostal retractions. SpO2 75% HR NSR 96 BP 169/95 (116).  Endorsed severe shortness of breath.  No pain. Fully alert and oriented. Response to questions only consisted of one word answers due to respiratory status.  Per primary RN, intermittent air leak in chest tube and draining appropriately.  Bed at 45 degrees and grasping onto bedside rails, wanting to sit up higher.  Bilateral diminished and very coarse lung sounds on auscultation. +1 BLE pitting edema in ankles. Generalized edema elsewhere.  Distended abdomen. Generalized weakness, pupils 4 mm PERRLA.  Dr. Karren Burly ordered 40 mg IV lasix and given via left wrist peripheral. Placed on HHFNC 40 L 100% with slight improvement in SpO2 to 80-82% but no decrease in work of breathing.  It was determined that Nathaniel Webb needed to quickly go to the ICU for close monitoring and potential intubation.  Transfer orders placed by Dr. Karren Burly.  Interventions:  40 mg IV lasix HHFNC 40 L 100% + NRB ETT with mechanical support. PRVC   Plan of Care (if not transferred):  Event Summary:   at  2240 RRT at bedside and Dr. Karren Burly on phone with primary nurse.  Initial assessment completed    at  2250 HHFNC placed at 45 L 100%, no improvement in WOB  2305 Transferred to intensive care unit by RRT.  SpO2 86% HR NSR 98 BP 147/100 (114).   2337 Results for Nathaniel Webb, Nathaniel Webb (MRN [id number]) as of 11/30/2019 02:46  Ref. Range 11/29/2019 23:37  Sample type Unknown ARTERIAL  pH, Arterial Latest Ref Range: 7.350 - 7.450  7.513 (H)  pCO2 arterial Latest Ref Range: 32.0 - 48.0 mmHg 37.0  pO2, Arterial Latest Ref Range: 83.0 - 108.0 mmHg 34.0 (LL)  TCO2 Latest Ref Range: 22 - 32 mmol/L 31  Acid-Base Excess Latest Ref Range: 0.0 - 2.0 mmol/L 6.0 (H)  Bicarbonate Latest Ref  Range: 20.0 - 28.0 mmol/L 30.3 (H)  O2 Saturation Latest Units: % 77.0  Patient temperature Unknown 35.0 C  Collection site Unknown RADIAL, ALLEN'S TEST ACCEPTABLE    2340 Dr. Warrick Parisian at bedside via E Link.  Determined Nathaniel Webb needed ETT. CRNA notified and en route.   0015 ETT placed, confirmed with bilateral breath sounds and stat CXR.  Primary ICU RN [name] to take over.    PRVC 100% PEEP 8 F 24 Vt 490        Leota Jacobsen

## 2019-11-30 NOTE — Progress Notes (Signed)
NAME:  Nathaniel Webb, MRN:  [id number], DOB:  [birth year], LOS: 19 ADMISSION DATE:  11/11/2019, CONSULTATION DATE:  1/12 REFERRING MD:  Joseph Art, CHIEF COMPLAINT:  Pleural effusion   Brief History   54 y/o male admitted with COVID pneumonia on 12/29, developed loculated pleural effusion by 1/10 with associated hydropneumothorax.    Past Medical History  DM II  HTN Morbid Obesity  Significant Hospital Events   12/29 Admit  1/12 PCCM consulted, thora performed 1/13 Tx to ICU with worsening pneumothorax   Consults:  PCCM  Procedures:  1/13 chest tube R side >  1/17 ETT >>  Significant Diagnostic Tests:  CTA Chest 12/29 >> positive for PE with small volume embolus in the distal right pulmonary artery, lobar, and some segmental branches CT ABD/Pelvis 1/10 >> moderate stool burden in colon, R>L airspace disease compatible with PNA, loculated right pleural effusion CT Chest w/o 1/10 >> multiloculated right lung hydropneumothorax, larger volume of pleural fluid relative to pleural air, progression of multilobar right lung consolidation, small volume loculated right pneumomediastinum Thora 1/12 >> glucose <20, LD 2,447, protein 5.2, total nucleated cells 117,299, neutrophil 85 CT Chest w/o 1/13 >> limited exam by motion, large right hydropneumothorax with numerous thin internal septations with air fluid levels increased in size, complete right lung atelectasis, pneumomediastinum in the anterior right pericardial region, similar extensive patchy GGO on L  Micro Data:  BCID 1/10 >> MSSA BCx2 1/10 >> MSSA  Pleural fluid culture 1/12 >> MSSA  Antimicrobials/COVID Rx  Remdesivir 12/27 > 1/1 Tocilizumab 12/28 >  Cefepime 1/10 >>1/14 Linezolid 1/10 >> 1/14 Cefazolin 1/14 >>  Interim history/subjective:   Intubated overnight for worsening hypoxia Afebrile Sedated with propofol and fentanyl   Objective   Blood pressure 92/74, pulse 88, temperature 98.2 F (36.8 C), temperature  source Axillary, resp. rate (!) 23, height 5\' 5"  (1.651 m), weight 95.1 kg, SpO2 100 %.    Vent Mode: PRVC FiO2 (%):  [70 %-100 %] 70 % Set Rate:  [24 bmp] 24 bmp Vt Set:  [490 mL] 490 mL PEEP:  [8 cmH20-12 cmH20] 8 cmH20 Plateau Pressure:  [22 cmH20-40 cmH20] 22 cmH20   Intake/Output Summary (Last 24 hours) at 11/30/2019 1113 Last data filed at 11/30/2019 1000 Gross per 24 hour  Intake 1074.89 ml  Output 2400 ml  Net -1325.11 ml   Filed Weights   11/27/19 0500 11/28/19 0616 11/30/19 0500  Weight: 95.5 kg 95.3 kg 95.1 kg    Examination: General: Well-built man, sedated, on vent HEENT: Moist mucosa, pallor, no icterus, oral ETT Neuro: Sedated, RASS -3 CV: s1s2 RRR, no m/r/g PULM: No accessory muscle use, chest tube with large air leak, decreased breath sounds on right GI: soft, bsx4 active  Extremities: warm/dry, no edema  Skin: no rashes or lesions  CXR 1/17 personally reviewed-serial chest x-ray shows increased gas at the right base?  Worsening pneumothorax followed by improvement post intubation -Sternal shift has decreased  ABG shows good ventilation and improved PO2. Labs show normal electrolytes, slight increase in creatinine to 1.3, stable leukocytosis and anemia  Resolved Hospital Problem list    Assessment & Plan:   worsening hypoxia could have been related to increase in pneumothorax, nonfunctioning tube versus obstruction  Acute respiratory failure with hypoxia due to Covid pneumonia  -Continue lung protective ventilation, keep PEEP 8-10 range, okay to increase FiO2 if needed -Maintain driving pressure less than 15 -No need to prone today -He appears synchronous with deep sedation on  propofol/fentanyl with goal RASS -3 to -4, does not need a paralytic at this time   R Empyema Hydropneumothorax -pneumothorax appears to have improved and chest tube has good air leak  -continue chest tube to -20 cm suction  -continue chest tube flushes per protocol  -If  pneumothorax worsen, may need second chest tube directed to the apex   COVID Pneumonia Completed remdesivir and solumedrol -follow clinically   Small volume Acute PE -continue IV heparin per pharmacy   MSSA Bacteremia, pneumonia and Empyema  -continue cefazolin    Best practice:  DVT: heparin gtt Diet: per TRH  TFs protonix Full code Wife updated 1/17  The patient is critically ill with multiple organ systems failure and requires high complexity decision making for assessment and support, frequent evaluation and titration of therapies, application of advanced monitoring technologies and extensive interpretation of multiple databases. Critical Care Time devoted to patient care services described in this note independent of APP/resident  time is 35 minutes.    Kara Mead MD. [facility] Flood. [place]  If no response to pager , please call [phone]   11/30/2019

## 2019-11-30 NOTE — Anesthesia Procedure Notes (Signed)
Procedure Name: Intubation Date/Time: 11/30/2019 12:10 AM Performed by: Laruth Bouchard., CRNA Pre-anesthesia Checklist: Patient identified, Emergency Drugs available, Suction available, Patient being monitored and Timeout performed Patient Re-evaluated:Patient Re-evaluated prior to induction Oxygen Delivery Method: Circle system utilized Preoxygenation: Pre-oxygenation with 100% oxygen Induction Type: IV induction, Rapid sequence and Cricoid Pressure applied Laryngoscope Size: Glidescope and 4 Grade View: Grade I Tube type: Oral Tube size: 8.0 mm Number of attempts: 1 Airway Equipment and Method: Video-laryngoscopy and Rigid stylet Placement Confirmation: ETT inserted through vocal cords under direct vision,  positive ETCO2 and breath sounds checked- equal and bilateral Secured at: 22 cm Tube secured with: Tape Dental Injury: Teeth and Oropharynx as per pre-operative assessment

## 2019-11-30 NOTE — Progress Notes (Signed)
eLink Physician-Brief Progress Note Patient Name: Nathaniel Webb DOB: 1966-08-26 MRN: [id number]   Date of Service  11/30/2019  HPI/Events of Note  Patient is COVID -19 pneumonia Pt with acute hypoxemic respiratory failure transferred to [facility] earlier this evening, Pt also has empyema and is s/p chest tube placement.  eICU Interventions  New patient evaluation completed. Intubation ordered for profound hypoxemia        Kushi Kun U Abhinav Mayorquin 11/30/2019, 12:09 AM

## 2019-11-30 NOTE — Procedures (Signed)
Arterial Catheter Insertion Procedure Note Nathaniel Webb [id number] [birth year]  Procedure: Insertion of Arterial Catheter  Indications: Blood pressure monitoring and Frequent blood sampling  Procedure Details Consent: Unable to obtain consent because of altered level of consciousness. Time Out: Verified patient identification, verified procedure, site/side was marked, verified correct patient position, special equipment/implants available, medications/allergies/relevent history reviewed, required imaging and test results available.  Performed  Maximum sterile technique was used including antiseptics, cap, gloves, gown, hand hygiene, mask and sheet. Skin prep: Chlorhexidine; local anesthetic administered 20 gauge catheter was inserted into right radial artery using the Seldinger technique. ULTRASOUND GUIDANCE USED: NO Evaluation Blood flow good; BP tracing good. Complications: No apparent complications.   Nathaniel Webb 11/30/2019

## 2019-11-30 NOTE — Progress Notes (Signed)
Patient's wife Margaretha Glassing called at 2015 to ask about how the patient was doing.  Informed her that the patient remains intubated and sedated.   I told her that we have recently come down from 60 to 50% FIO2 on the vent and that so far he appears to be tolerating the change well.  She requested that we continue to keep her updated about his status.

## 2019-12-01 ENCOUNTER — Inpatient Hospital Stay (HOSPITAL_COMMUNITY): Payer: Medicaid Other

## 2019-12-01 DIAGNOSIS — Z9689 Presence of other specified functional implants: Secondary | ICD-10-CM

## 2019-12-01 LAB — GLUCOSE, CAPILLARY
Glucose-Capillary: 120 mg/dL — ABNORMAL HIGH (ref 70–99)
Glucose-Capillary: 128 mg/dL — ABNORMAL HIGH (ref 70–99)
Glucose-Capillary: 153 mg/dL — ABNORMAL HIGH (ref 70–99)
Glucose-Capillary: 154 mg/dL — ABNORMAL HIGH (ref 70–99)
Glucose-Capillary: 157 mg/dL — ABNORMAL HIGH (ref 70–99)
Glucose-Capillary: 170 mg/dL — ABNORMAL HIGH (ref 70–99)
Glucose-Capillary: 205 mg/dL — ABNORMAL HIGH (ref 70–99)

## 2019-12-01 LAB — BASIC METABOLIC PANEL
Anion gap: 14 (ref 5–15)
BUN: 33 mg/dL — ABNORMAL HIGH (ref 6–20)
CO2: 28 mmol/L (ref 22–32)
Calcium: 7.9 mg/dL — ABNORMAL LOW (ref 8.9–10.3)
Chloride: 95 mmol/L — ABNORMAL LOW (ref 98–111)
Creatinine, Ser: 1.14 mg/dL (ref 0.61–1.24)
GFR calc Af Amer: >60 mL/min (ref >60)
GFR calc non Af Amer: >60 mL/min (ref >60)
Glucose, Bld: 127 mg/dL — ABNORMAL HIGH (ref 70–99)
Potassium: 4.1 mmol/L (ref 3.5–5.1)
Sodium: 137 mmol/L (ref 135–145)

## 2019-12-01 LAB — CBC
HCT: 36.2 % — ABNORMAL LOW (ref 39.0–52.0)
Hemoglobin: 11.5 g/dL — ABNORMAL LOW (ref 13.0–17.0)
MCH: 26.5 pg (ref 26.0–34.0)
MCHC: 31.8 g/dL (ref 30.0–36.0)
MCV: 83.4 fL (ref 80.0–100.0)
Platelets: 257 10*3/uL (ref 150–400)
RBC: 4.34 MIL/uL (ref 4.22–5.81)
RDW: 16.5 % — ABNORMAL HIGH (ref 11.5–15.5)
WBC: 17.8 10*3/uL — ABNORMAL HIGH (ref 4.0–10.5)
nRBC: 0.2 % (ref 0.0–0.2)

## 2019-12-01 LAB — CULTURE, BLOOD (ROUTINE X 2)
Culture: NO GROWTH
Special Requests: NORMAL

## 2019-12-01 LAB — C-REACTIVE PROTEIN: CRP: 7.8 mg/dL — ABNORMAL HIGH (ref <1.0)

## 2019-12-01 LAB — PHOSPHORUS: Phosphorus: 3.1 mg/dL (ref 2.5–4.6)

## 2019-12-01 LAB — D-DIMER, QUANTITATIVE: D-Dimer, Quant: 3.33 ug/mL-FEU — ABNORMAL HIGH (ref 0.00–0.50)

## 2019-12-01 LAB — MAGNESIUM: Magnesium: 2.6 mg/dL — ABNORMAL HIGH (ref 1.7–2.4)

## 2019-12-01 LAB — FERRITIN: Ferritin: 1738 ng/mL — ABNORMAL HIGH (ref 24–336)

## 2019-12-01 MED ORDER — FENTANYL BOLUS VIA INFUSION
300.0000 ug | Freq: Once | INTRAVENOUS | Status: AC
  Administered 2019-12-01: 300 ug via INTRAVENOUS

## 2019-12-01 MED ORDER — MIDAZOLAM HCL 2 MG/2ML IJ SOLN: 2.0000 mg | Freq: Once | INTRAMUSCULAR | Status: DC

## 2019-12-01 MED ORDER — FENTANYL CITRATE (PF) 100 MCG/2ML IJ SOLN
25.0000 ug | INTRAMUSCULAR | Status: DC | PRN
  Administered 2019-12-03 – 2019-12-04 (×3): 100 ug via INTRAVENOUS
  Administered 2019-12-04: 50 ug via INTRAVENOUS
  Administered 2019-12-04 – 2019-12-07 (×3): 100 ug via INTRAVENOUS
  Administered 2019-12-11 (×2): 50 ug via INTRAVENOUS
  Administered 2019-12-12 – 2019-12-14 (×3): 100 ug via INTRAVENOUS
  Filled 2019-12-01 (×14): qty 2

## 2019-12-01 MED ORDER — ORAL CARE MOUTH RINSE
15.0000 mL | Freq: Two times a day (BID) | OROMUCOSAL | Status: DC
  Administered 2019-12-01 – 2019-12-17 (×26): 15 mL via OROMUCOSAL

## 2019-12-01 MED ORDER — ENSURE ENLIVE PO LIQD
237.0000 mL | Freq: Three times a day (TID) | ORAL | Status: DC
  Administered 2019-12-01 – 2019-12-17 (×37): 237 mL via ORAL

## 2019-12-01 MED ORDER — CHLORHEXIDINE GLUCONATE 0.12 % MT SOLN
15.0000 mL | Freq: Two times a day (BID) | OROMUCOSAL | Status: DC
  Administered 2019-12-01 – 2019-12-17 (×32): 15 mL via OROMUCOSAL
  Filled 2019-12-01 (×28): qty 15

## 2019-12-01 MED ORDER — LIDOCAINE HCL 1 % IJ SOLN
20.0000 mL | Freq: Once | INTRAMUSCULAR | Status: AC
  Administered 2019-12-01: 20 mL via INTRADERMAL
  Filled 2019-12-01: qty 20

## 2019-12-01 NOTE — Progress Notes (Signed)
CRITICAL VALUE ALERT  Critical Value: Chest X-Ray  12/01/19 1145  Provider Notified: CCM MD  Orders Received/Actions taken: RT notified. Plans to extubate today.

## 2019-12-01 NOTE — Progress Notes (Signed)
NAME:  Nathaniel Webb, MRN:  [id number], DOB:  12-Sep-1966, LOS: 20 ADMISSION DATE:  11/11/2019, CONSULTATION DATE:  1/12 REFERRING MD:  Joseph Art, CHIEF COMPLAINT:  Pleural effusion   Brief History   54 y/o male admitted with COVID pneumonia on 12/29, developed loculated pleural effusion by 1/10 with associated hydropneumothorax.    Past Medical History  DM II , HTN, Morbid Obesity  Significant Hospital Events   12/29 Admit  1/12 PCCM consulted, thora performed 1/13 Tx to ICU with worsening pneumothorax  1/17 intubated  1/18: Continues to have persistent right-sided pneumothorax in spite of chest tube. New 28 french placed  Consults:  PCCM  Procedures:  1/13 chest tube R side >  1/17 ETT >> 1/18: 28 french chest tube placed  Significant Diagnostic Tests:  CTA Chest 12/29 >> positive for PE with small volume embolus in the distal right pulmonary artery, lobar, and some segmental branches CT ABD/Pelvis 1/10 >> moderate stool burden in colon, R>L airspace disease compatible with PNA, loculated right pleural effusion CT Chest w/o 1/10 >> multiloculated right lung hydropneumothorax, larger volume of pleural fluid relative to pleural air, progression of multilobar right lung consolidation, small volume loculated right pneumomediastinum Thora 1/12 >> glucose <20, LD 2,447, protein 5.2, total nucleated cells 117,299, neutrophil 85 CT Chest w/o 1/13 >> limited exam by motion, large right hydropneumothorax with numerous thin internal septations with air fluid levels increased in size, complete right lung atelectasis, pneumomediastinum in the anterior right pericardial region, similar extensive patchy GGO on L  Micro Data:  BCID 1/10 >> MSSA BCx2 1/10 >> MSSA  Pleural fluid culture 1/12 >> MSSA  Antimicrobials/COVID Rx  Remdesivir 12/27 > 1/1 Tocilizumab 12/28 >  Cefepime 1/10 >>1/14 Linezolid 1/10 >> 1/14 Cefazolin 1/14 >>  Interim history/subjective:  No distress. Awake and  interactive   Objective   Blood pressure 98/68, pulse 91, temperature 98.8 F (37.1 C), temperature source Axillary, resp. rate (Abnormal) 23, height 5\' 5"  (1.651 m), weight 92.9 kg, SpO2 100 %.    Vent Mode: PRVC FiO2 (%):  [40 %-70 %] 40 % Set Rate:  [24 bmp] 24 bmp Vt Set:  [490 mL] 490 mL PEEP:  [8 cmH20] 8 cmH20 Plateau Pressure:  [22 cmH20-26 cmH20] 26 cmH20   Intake/Output Summary (Last 24 hours) at 12/01/2019 0801 Last data filed at 12/01/2019 0700 Gross per 24 hour  Intake 1537.3 ml  Output 2740 ml  Net -1202.7 ml   Filed Weights   11/28/19 0616 11/30/19 0500 12/01/19 0431  Weight: 95.3 kg 95.1 kg 92.9 kg    Examination: General 54 year old male resting in bed no distress on PSV but still has sig air leak on CT HENT NCAT no JVD orally intubated Pulm scattered rhonchi no accessory use  Vent setting FIO2 40 PEEP 5 Pulling 500 ml Vt on PSV.  Chest tube: right small bore chest tube w/ intermittent airleak 28 french lateral CT intermitte4nt 4-5 airleak   Card RRR  abd not tender  Ext no sig edema Neuro appropriate and following commands  Resolved Hospital Problem list    Assessment & Plan:    Acute Hypoxic respiratory failure 2/2 Covid pneumonia c/b MSSA PNA, empyema and worsening PTX -has completed Remdesivir and systemic steroids  -intubated 1/17 felt 2/2 possible CT malfxn (kinked OR obstructed).  -Portable chest x-ray personally reviewed: The endotracheal tubes in satisfactory position, there is a right small bore chest tube present.  There continues to be significant pneumothorax on  the right.  Bilateral right greater than left airspace disease persists Plan Cont low tidal vol ventilation and daily SBT-->suspect he can be extubated either later today or tomorrow RASS goal 0 w/ PAD protocol  VAP bundle  Day 5 ancef, # 9 antibiotics.  Daily assessment for diuresis   Small Acute PE Plan Cont systemic LMWH  MSSA Bacteremia ->blood cultures from repeat  on 1/12 negative  Plan Cont ancef Needs TEE at some point per ID (this may be difficult logistically) length of RX TBD   Intermittent fluid electrolyte imbalance: Plan Monitor  Hyperglycemia Plan Sliding scale insulin   Best practice:  DVT: heparin gtt Diet: per TRH  TFs protonix Full code Wife updated  My cct 31 minutes  Erick Colace ACNP-BC [place] Pager # [phone] OR # [phone] if no answer

## 2019-12-01 NOTE — Procedures (Signed)
Chest Tube Insertion Procedure Note  Indications:  Clinically significant Pneumothorax  Pre-operative Diagnosis: Pneumothorax  Post-operative Diagnosis: Pneumothorax  Procedure Details  Informed consent was obtained for the procedure, including sedation.  Risks of lung perforation, hemorrhage, arrhythmia, and adverse drug reaction were discussed.   After sterile skin prep, using standard technique, a 28 French tube was placed in the right lateral 8 rib space.  Findings: 5/5 airleak   Estimated Blood Loss:  Minimal         Specimens:  None              Complications:  None; patient tolerated the procedure well.         Disposition: ICU - intubated and critically ill.         Condition: stable  Simonne Martinet ACNP-BC [facility]monary/Critical Care Pager # [phone] OR # [phone] if no answer

## 2019-12-01 NOTE — Progress Notes (Signed)
PT Cancellation Note  Patient Details Name: Nathaniel Webb MRN: [id number] DOB: 31-Oct-1966   Cancelled Treatment:    Reason Eval/Treat Not Completed: Medical issues which prohibited therapy, Moved to ICU, intubated and sedated. Will follow for medical readiness for PT.    Rada Hay 12/01/2019, 6:48 AM  Blanchard Kelch PT Acute Rehabilitation Services Pager [phone] Office [phone]

## 2019-12-01 NOTE — Progress Notes (Signed)
Initial Nutrition Assessment  DOCUMENTATION CODES:   Obesity unspecified  INTERVENTION:   Downgrade diet to Dysphagia III due to poor dentition  Ensure Enlive po TID, each supplement provides 350 kcal and 20 grams of protein  Pt receiving Hormel Shake daily with Breakfast which provides 520 kcals and 22 g of protein and Magic cup BID with lunch and dinner, each supplement provides 290 kcal and 9 grams of protein, automatically on meal trays to optimize nutritional intake.     NUTRITION DIAGNOSIS:   Inadequate oral intake related to acute illness, catabolic illness, decreased appetite as evidenced by NPO status, meal completion < 50%.  GOAL:   Patient will meet greater than or equal to 90% of their needs  MONITOR:   PO intake, Supplement acceptance, Labs, Weight trends, Diet advancement  REASON FOR ASSESSMENT:   Consult, Ventilator Enteral/tube feeding initiation and management  ASSESSMENT:   54 yo male admitted with COVID 19 pneumonia, developed worsening pneumothorax requiring chest tube, brief intubation.   PMH includes DM, HTN, morbid obesity  12/29 Admit 01/13 Worsening pneumothorax, chest tube placed 01/17 Intubated 01/18 Extubated  Extubated to 4L [place], no nutrition while on vent  Recorded po intake 10-50% of meals; 33% on average. Unable to obtain diet and weight history at this time  Pt remains NPO post extubation; noted SLP had been following pt prior to intubation; need to re-consult now that pt has been extubated  Admit weight 106.1 kg; current wt 92.9 kg  Labs: CBGs 120-170, Creatinine 1.14, BUN wdl Meds: Vitamin C, zinc sulfate, ss novolog, levemir    Diet Order:   Diet Order            DIET DYS 3 Room service appropriate? Yes; Fluid consistency: Thin  Diet effective now              EDUCATION NEEDS:   Not appropriate for education at this time  Skin:  Skin Assessment: Reviewed RN Assessment  Last BM:  1/17  Height:   Ht Readings  from Last 1 Encounters:  11/11/19 5\' 5"  (1.651 m)    Weight:   Wt Readings from Last 1 Encounters:  12/01/19 92.9 kg    Ideal Body Weight:  61.8 kg  BMI:  Body mass index is 34.08 kg/m.  Estimated Nutritional Needs:   Kcal:  12/03/19 kcals  Protein:  135-160 g  Fluid:  >/= 2.2 L  Cate Ryland Smoots MS, RDN, LDN, CNSC [phone] Pager  [phone] Weekend/On-Call Pager

## 2019-12-01 NOTE — Procedures (Signed)
Extubation Procedure Note  Patient Details:   Name: Nathaniel Webb DOB: 1966-09-27 MRN: [id number]   Airway Documentation:    Vent end date: 12/01/19 Vent end time: 1510   Evaluation  O2 sats: stable throughout Complications: No apparent complications Patient did tolerate procedure well. Bilateral Breath Sounds: Diminished   Yes   Pt extubated per physician order. Pt suctioned via ETT and orally. Positive cuff leak. Pt extubated to 4L nasal cannula. Pt with good strong cough, able to speak name and no stridor heard at this time. IS and pep therapies initiated with pt who verbalizes understanding. RT will continue to monitor.   Derinda Late 12/01/2019, 4:08 PM

## 2019-12-01 NOTE — Progress Notes (Signed)
Patient's wife Margaretha Glassing updated regarding patient condition.

## 2019-12-01 NOTE — Progress Notes (Signed)
Afternoon rounds  Awake Cooperative  Hemodynamically stable  Vts 400-600 on PSV 5.  airleak persists 4/5 on larger bore 28 fr tube  Able to cough and sit up to command  Plan Extubate Titrate oxygen Prn pain rx for chest tube ->I am hopeful off positive pressure the PTX will clear faster.   Simonne Martinet ACNP-BC [facility]/Critical Care Pager # [phone] OR # [phone] if no answer

## 2019-12-01 NOTE — Progress Notes (Signed)
[facility]    Date of Admission:  11/11/2019   Total days of antibiotics 9/ day 5 ancef           ID: Nathaniel Webb is a 54 y.o. male with covid pneumonia complicated acute respiratory distress requiring intubation, c/b PTX, PE, and MSSA bacteremia with pneumonia/empyema. Principal Problem:   Acute respiratory failure with hypoxemia (HCC) Active Problems:   Type 2 diabetes, controlled, with peripheral neuropathy (HCC)   Hypertension   Morbid obesity (HCC)   Pneumonia due to COVID-19 virus   Diabetes mellitus type 2, uncontrolled, with complications (HCC)   Acute pulmonary embolus (HCC)   Demand ischemia (HCC)   Constipation   Loculated pleural effusion   MSSA bacteremia   Empyema (HCC)   Hydropneumothorax   ARDS (adult respiratory distress syndrome) (HCC)   Pneumonia of right lung due to methicillin susceptible Staphylococcus aureus (MSSA) (HCC)    Subjective: Afebrile. This morning had larger chest tube place to treat pneumothorax since persistent airleak with small bore chest tube Labs showing improvement in leukocytosis down to 17.8  Medications:  . vitamin C  500 mg Per Tube Daily  . chlorhexidine  15 mL Mouth/Throat BID  . Chlorhexidine Gluconate Cloth  6 each Topical Daily  . chlorpheniramine-HYDROcodone  5 mL Oral Q12H  . enoxaparin (LOVENOX) injection  100 mg Subcutaneous Q12H  . feeding supplement (VITAL HIGH PROTEIN)  1,000 mL Per Tube Q24H  . insulin aspart  0-20 Units Subcutaneous TID WC  . insulin detemir  10 Units Subcutaneous Daily  . mouth rinse  15 mL Mouth Rinse 10 times per day  . metoprolol tartrate  50 mg Per Tube BID  . pantoprazole sodium  40 mg Per Tube Q1200  . pneumococcal 23 valent vaccine  0.5 mL Intramuscular Tomorrow-1000  . predniSONE  30 mg Per Tube Q breakfast  . sodium chloride flush  3 mL Intravenous Q12H  . zinc sulfate  220 mg Per Tube Daily    Objective: Vital signs in last 24 hours: Temp:   [98.4 F (36.9 C)-99.3 F (37.4 C)] 99.3 F (37.4 C) (01/18 1135) Pulse Rate:  [81-102] 102 (01/18 1445) Resp:  [11-36] 22 (01/18 1445) BP: (85-128)/(66-85) 127/84 (01/18 1430) SpO2:  [89 %-100 %] 99 % (01/18 1445) Arterial Line BP: (108-112)/(67-69) 108/67 (01/17 1600) FiO2 (%):  [40 %-60 %] 40 % (01/18 1200) Weight:  [92.9 kg] 92.9 kg (01/18 0431)  Did not examine Lab Results Recent Labs    11/30/19 0442 11/30/19 0442 11/30/19 0551 12/01/19 0400  WBC 19.3*  --   --  17.8*  HGB 10.8*   < > 11.6* 11.5*  HCT 34.0*   < > 34.0* 36.2*  NA 137   < > 137 137  K 4.2   < > 4.3 4.1  CL 97*  --   --  95*  CO2 27  --   --  28  BUN 38*  --   --  33*  CREATININE 1.30*  --   --  1.14   < > = values in this interval not displayed.   Liver Panel Recent Labs    11/29/19 0126 11/30/19 0442  PROT 6.7 6.9  ALBUMIN 2.1* 2.2*  AST 53* 89*  ALT 87* 101*  ALKPHOS 163* 183*  BILITOT 0.3 0.3   C-Reactive Protein Recent Labs    11/30/19 0442 12/01/19 0400  CRP 9.7* 7.8*    Microbiology: Blood cx on  1/12 NGTD Pleural cx on 1/12 MSSA Blood cx on 1/10 x 2 MSSA  Staphylococcus aureus    MIC    CIPROFLOXACIN <=0.5 SENSI... Sensitive    CLINDAMYCIN RESISTANT  Resistant    ERYTHROMYCIN >=8 RESISTANT  Resistant    GENTAMICIN <=0.5 SENSI... Sensitive    Inducible Clindamycin POSITIVE  Resistant    OXACILLIN <=0.25 SENS... Sensitive    RIFAMPIN <=0.5 SENSI... Sensitive    TETRACYCLINE <=1 SENSITIVE  Sensitive    TRIMETH/SULFA <=10 SENSIT... Sensitive    VANCOMYCIN 1 SENSITIVE  Sensitive     Studies/Results: DG CHEST PORT 1 VIEW  Result Date: 12/01/2019 CLINICAL DATA:  Chest tube placement EXAM: PORTABLE CHEST 1 VIEW COMPARISON:  December 01, 2019 FINDINGS: A pigtail catheter projects over the right lung base. New right chest tube projects over the right mid lung. The right-sided pneumothorax is smaller in the interval, measuring 2.4 cm at the apex. No left-sided pneumothorax. The  cardiomediastinal silhouette is stable. The ETT terminates 1.6 cm above the carina. The NG tube terminates below today's film. Mild edema. IMPRESSION: 1. Right chest tube placement. A right-sided pneumothorax remains but is smaller in the interval. 2. The ETT terminates 1.6 cm above the carina. Recommend withdrawing 1 cm. 3. Probable mild edema. These results will be called to the ordering clinician or representative by the Radiologist Assistant, and communication documented in the PACS or zVision Dashboard. Electronically Signed   By: Dorise Bullion III M.D   On: 12/01/2019 11:36   DG Chest Port 1 View  Result Date: 12/01/2019 CLINICAL DATA:  Respiratory failure EXAM: PORTABLE CHEST 1 VIEW COMPARISON:  11/30/2019 FINDINGS: No significant change in a moderate, multiloculated layering right hydropneumothorax with pigtail drainage catheter about the right lung base. Slightly improved heterogeneous airspace opacity and consolidation of the left lung. Endotracheal tube and esophagogastric tube remain in position. Mild cardiomegaly. IMPRESSION: 1. No significant change in a moderate, multiloculated layering right hydropneumothorax with pigtail drainage catheter about the right lung base. 2. Slightly improved heterogeneous airspace opacity and consolidation of the left lung. 3.  Endotracheal tube and esophagogastric tube remain in position. Electronically Signed   By: Eddie Candle M.D.   On: 12/01/2019 08:13   DG Chest Port 1 View  Result Date: 11/30/2019 CLINICAL DATA:  Intubated patient.  Respiratory failure EXAM: PORTABLE CHEST 1 VIEW COMPARISON:  11/30/2019 FINDINGS: Endotracheal tube and NG tube unchanged. Chest tube at the RIGHT lung base. Interval reduction in volume of gas in the RIGHT lung base. RIGHT basilar atelectasis remains. Airspace disease in the LEFT lower lobe is similar. IMPRESSION: 1. Reduction in volume of gas at the RIGHT lung base surrounding the chest tube. 2. Stable support apparatus. 3.  Persistent RIGHT basilar atelectasis and LEFT lobe airspace disease. Electronically Signed   By: Suzy Bouchard M.D.   On: 11/30/2019 06:31   DG Chest Port 1 View  Result Date: 11/30/2019 CLINICAL DATA:  Endotracheal tube placement EXAM: PORTABLE CHEST 1 VIEW COMPARISON:  X-ray 1 day prior FINDINGS: The endotracheal tube terminates approximately 3.2 cm above the carina. There is a right-sided chest tube in place. Diffuse bolus changes are noted throughout the right lung field with mass effect and shift of the mediastinum to the left. There are diffuse hazy airspace opacities throughout the left lung field. There may be a persistent right-sided pneumothorax. There is a probable small left-sided pleural effusion. IMPRESSION: 1. Lines and tubes as above. 2. Otherwise, no significant short interval change. Electronically Signed  By: Katherine Mantle M.D.   On: 11/30/2019 02:18   DG CHEST PORT 1 VIEW  Result Date: 11/29/2019 CLINICAL DATA:  Shortness of breath. EXAM: PORTABLE CHEST 1 VIEW COMPARISON:  Radiograph earlier this day. CT 11/26/2019 FINDINGS: Pigtail catheter at the right lung base. Relative lucency throughout the right hemithorax likely due to hydropneumothorax, a discrete pleural line is not seen. Bubbly lucencies at the right lung base may represent pleural adhesions. Opacity at the right infrahilar lung likely represents collapsed/consolidated lung surrounded by extrapleural air. Slight leftward mediastinal shift which is been present on prior exams. Heterogeneous opacities throughout the left lung with slight worsening. IMPRESSION: 1. Pigtail catheter at the right lung base. Relative lucency throughout the right hemithorax likely due to hydropneumothorax, a discrete pleural line is not seen. However this may be due to anterior positioning of extrapleural air. Bubbly lucencies at the right lung base may represent pleural adhesions. There is progressive lucency in the right hemithorax over  prior exams suggesting persistent and possibly increasing pneumothorax. 2. Opacity at the right infrahilar lung likely represents collapsed/consolidated lung. 3. Heterogeneous opacities throughout the left lung are unchanged. Electronically Signed   By: Narda Rutherford M.D.   On: 11/29/2019 23:47   DG Abd Portable 1V  Result Date: 11/30/2019 CLINICAL DATA:  OG tube placement. COVID-19. EXAM: PORTABLE ABDOMEN - 1 VIEW COMPARISON:  CT of the abdomen and pelvis without contrast 11/23/2019 FINDINGS: NG tube courses through the stomach and likely terminates in the duodenum. Right pleural pigtail catheter is in place. Bowel gas pattern is normal. IMPRESSION: NG tube terminates in the duodenum. Electronically Signed   By: Marin Roberts M.D.   On: 11/30/2019 08:43     Assessment/Plan: Complicated MSSA bacteremia likely secondary to MSSA pneumonia/empyema = I suspect that the sequence of event is that he had viral pneumonia with SARS-CoV2 complicated by secondary bacterial pneumonia with MSSA and subsequently had bacteremia. He had previous blood cx on admit x 4 which were NGTD.  TTE showed no signs of endocarditis. Plan to continue with treating as complicated infection with 4 wk of cefazolin. Day 1 would be 1/13 (after pleural culture/source control)  Once he is no longer require ventilator support and stable, consider imaging spine to ensure no secondary nidus of infection.   [facility] for Infectious Diseases Cell: [phone] Pager: [phone]  12/01/2019, 2:51 PM

## 2019-12-01 NOTE — Progress Notes (Signed)
Speech Language Pathology Discharge Patient Details Name: Nathaniel Webb MRN: [id number] DOB: 08/30/1966 Today's Date: 12/01/2019 Time:  -     Patient discharged from SLP services secondary to medical decline - will need to re-order SLP to resume therapy services. Pt required increased respiratory needs and was intubated 1/17. ST to sign off. Please re consult if/when he becomes appropriate for po's.  Please see latest therapy progress note for current level of functioning and progress toward goals.    Progress and discharge plan discussed with patient and/or caregiver: Patient unable to participate in discharge planning and no caregivers available  GO     [name] 12/01/2019, 8:20 AM   Breck Coons Lonell Face.Ed Sports administrator Pager [phone] Office [phone]  s

## 2019-12-02 ENCOUNTER — Ambulatory Visit: Payer: Medicaid Other | Admitting: Gerontology

## 2019-12-02 ENCOUNTER — Inpatient Hospital Stay (HOSPITAL_COMMUNITY): Payer: Medicaid Other

## 2019-12-02 LAB — COMPREHENSIVE METABOLIC PANEL
ALT: 166 U/L — ABNORMAL HIGH (ref 0–44)
AST: 133 U/L — ABNORMAL HIGH (ref 15–41)
Albumin: 2.1 g/dL — ABNORMAL LOW (ref 3.5–5.0)
Alkaline Phosphatase: 170 U/L — ABNORMAL HIGH (ref 38–126)
Anion gap: 13 (ref 5–15)
BUN: 28 mg/dL — ABNORMAL HIGH (ref 6–20)
CO2: 27 mmol/L (ref 22–32)
Calcium: 7.8 mg/dL — ABNORMAL LOW (ref 8.9–10.3)
Chloride: 99 mmol/L (ref 98–111)
Creatinine, Ser: 1.15 mg/dL (ref 0.61–1.24)
GFR calc Af Amer: >60 mL/min (ref >60)
GFR calc non Af Amer: >60 mL/min (ref >60)
Glucose, Bld: 172 mg/dL — ABNORMAL HIGH (ref 70–99)
Potassium: 4.2 mmol/L (ref 3.5–5.1)
Sodium: 139 mmol/L (ref 135–145)
Total Bilirubin: 0.4 mg/dL (ref 0.3–1.2)
Total Protein: 6.6 g/dL (ref 6.5–8.1)

## 2019-12-02 LAB — CBC
HCT: 35.9 % — ABNORMAL LOW (ref 39.0–52.0)
Hemoglobin: 11.4 g/dL — ABNORMAL LOW (ref 13.0–17.0)
MCH: 26 pg (ref 26.0–34.0)
MCHC: 31.8 g/dL (ref 30.0–36.0)
MCV: 82 fL (ref 80.0–100.0)
Platelets: 318 10*3/uL (ref 150–400)
RBC: 4.38 MIL/uL (ref 4.22–5.81)
RDW: 16.3 % — ABNORMAL HIGH (ref 11.5–15.5)
WBC: 19.3 10*3/uL — ABNORMAL HIGH (ref 4.0–10.5)
nRBC: 0.6 % — ABNORMAL HIGH (ref 0.0–0.2)

## 2019-12-02 LAB — GLUCOSE, CAPILLARY
Glucose-Capillary: 127 mg/dL — ABNORMAL HIGH (ref 70–99)
Glucose-Capillary: 150 mg/dL — ABNORMAL HIGH (ref 70–99)
Glucose-Capillary: 160 mg/dL — ABNORMAL HIGH (ref 70–99)
Glucose-Capillary: 174 mg/dL — ABNORMAL HIGH (ref 70–99)
Glucose-Capillary: 219 mg/dL — ABNORMAL HIGH (ref 70–99)
Glucose-Capillary: 289 mg/dL — ABNORMAL HIGH (ref 70–99)

## 2019-12-02 LAB — FERRITIN: Ferritin: 1374 ng/mL — ABNORMAL HIGH (ref 24–336)

## 2019-12-02 LAB — C-REACTIVE PROTEIN: CRP: 8.1 mg/dL — ABNORMAL HIGH (ref <1.0)

## 2019-12-02 LAB — D-DIMER, QUANTITATIVE: D-Dimer, Quant: 2.91 ug/mL-FEU — ABNORMAL HIGH (ref 0.00–0.50)

## 2019-12-02 MED ORDER — INSULIN DETEMIR 100 UNIT/ML ~~LOC~~ SOLN
14.0000 [IU] | Freq: Every day | SUBCUTANEOUS | Status: DC
  Administered 2019-12-03: 14 [IU] via SUBCUTANEOUS
  Filled 2019-12-02 (×2): qty 0.14

## 2019-12-02 MED ORDER — FUROSEMIDE 10 MG/ML IJ SOLN
40.0000 mg | Freq: Two times a day (BID) | INTRAMUSCULAR | Status: AC
  Administered 2019-12-02 (×2): 40 mg via INTRAVENOUS
  Filled 2019-12-02 (×2): qty 4

## 2019-12-02 MED ORDER — ZINC SULFATE 220 (50 ZN) MG PO CAPS
220.0000 mg | ORAL_CAPSULE | Freq: Every day | ORAL | Status: DC
  Administered 2019-12-03 – 2019-12-17 (×15): 220 mg via ORAL
  Filled 2019-12-02 (×15): qty 1

## 2019-12-02 MED ORDER — ENOXAPARIN SODIUM 100 MG/ML ~~LOC~~ SOLN
1.0000 mg/kg | Freq: Two times a day (BID) | SUBCUTANEOUS | Status: DC
  Administered 2019-12-02 – 2019-12-04 (×4): 90 mg via SUBCUTANEOUS
  Filled 2019-12-02 (×5): qty 1

## 2019-12-02 MED ORDER — POTASSIUM CHLORIDE CRYS ER 20 MEQ PO TBCR
40.0000 meq | EXTENDED_RELEASE_TABLET | Freq: Once | ORAL | Status: AC
  Administered 2019-12-02: 40 meq via ORAL
  Filled 2019-12-02: qty 2

## 2019-12-02 MED ORDER — PREDNISONE 20 MG PO TABS
30.0000 mg | ORAL_TABLET | Freq: Every day | ORAL | Status: DC
  Administered 2019-12-03: 30 mg via ORAL
  Filled 2019-12-02: qty 1

## 2019-12-02 MED ORDER — ASCORBIC ACID 500 MG PO TABS
500.0000 mg | ORAL_TABLET | Freq: Every day | ORAL | Status: DC
  Administered 2019-12-03 – 2019-12-17 (×15): 500 mg via ORAL
  Filled 2019-12-02 (×15): qty 1

## 2019-12-02 MED ORDER — PANTOPRAZOLE SODIUM 40 MG PO TBEC
40.0000 mg | DELAYED_RELEASE_TABLET | Freq: Every day | ORAL | Status: DC
  Administered 2019-12-02 – 2019-12-03 (×2): 40 mg via ORAL
  Filled 2019-12-02 (×2): qty 1

## 2019-12-02 MED ORDER — METOPROLOL TARTRATE 50 MG PO TABS
50.0000 mg | ORAL_TABLET | Freq: Two times a day (BID) | ORAL | Status: DC
  Administered 2019-12-02 – 2019-12-17 (×29): 50 mg via ORAL
  Filled 2019-12-02 (×9): qty 1
  Filled 2019-12-02: qty 2
  Filled 2019-12-02 (×6): qty 1
  Filled 2019-12-02: qty 2
  Filled 2019-12-02 (×2): qty 1
  Filled 2019-12-02: qty 2
  Filled 2019-12-02: qty 1
  Filled 2019-12-02: qty 2
  Filled 2019-12-02 (×9): qty 1

## 2019-12-02 NOTE — Progress Notes (Signed)
NAME:  Nathaniel Webb, MRN:  [id number], DOB:  [birth year], LOS: 21 ADMISSION DATE:  11/11/2019, CONSULTATION DATE:  1/12 REFERRING MD:  Sherral Hammers, CHIEF COMPLAINT:  Pleural effusion   Brief History   54 y/o male admitted with COVID pneumonia on 12/29, developed loculated pleural effusion by 1/10 with associated hydropneumothorax.    Past Medical History  DM II , HTN, Morbid Obesity  Significant Hospital Events   12/29 Admit  1/12 PCCM consulted, thora performed 1/13 Tx to ICU with worsening pneumothorax  1/17 intubated  1/18: Continues to have persistent right-sided pneumothorax in spite of chest tube. New 28 french placed -->extubated later that evening 1/19 Consults:  PCCM  Procedures:  1/13 chest tube R side > small bore removed 1/19 1/17 ETT >> 1/18: 28 french chest tube placed  Significant Diagnostic Tests:  CTA Chest 12/29 >> positive for PE with small volume embolus in the distal right pulmonary artery, lobar, and some segmental branches CT ABD/Pelvis 1/10 >> moderate stool burden in colon, R>L airspace disease compatible with PNA, loculated right pleural effusion CT Chest w/o 1/10 >> multiloculated right lung hydropneumothorax, larger volume of pleural fluid relative to pleural air, progression of multilobar right lung consolidation, small volume loculated right pneumomediastinum Thora 1/12 >> glucose <20, LD 2,447, protein 5.2, total nucleated cells 117,299, neutrophil 85 CT Chest w/o 1/13 >> limited exam by motion, large right hydropneumothorax with numerous thin internal septations with air fluid levels increased in size, complete right lung atelectasis, pneumomediastinum in the anterior right pericardial region, similar extensive patchy GGO on L  Micro Data:  BCID 1/10 >> MSSA BCx2 1/10 >> MSSA  Pleural fluid culture 1/12 >> MSSA  Antimicrobials/COVID Rx  Remdesivir 12/27 > 1/1 Tocilizumab 12/28 >  Cefepime 1/10 >>1/14 Linezolid 1/10 >> 1/14 Cefazolin 1/14  >>  Interim history/subjective:    Objective   Blood pressure (Abnormal) 134/92, pulse (Abnormal) 120, temperature (Abnormal) 100.4 F (38 C), temperature source Axillary, resp. rate (Abnormal) 34, height 5\' 5"  (1.651 m), weight 89.9 kg, SpO2 92 %.    Vent Mode: CPAP;PSV FiO2 (%):  [40 %] 40 % PEEP:  [5 cmH20] 5 cmH20 Pressure Support:  [5 cmH20-8 cmH20] 5 cmH20 Plateau Pressure:  [18 cmH20-23 cmH20] 18 cmH20   Intake/Output Summary (Last 24 hours) at 12/02/2019 0810 Last data filed at 12/02/2019 0700 Gross per 24 hour  Intake 1736.65 ml  Output 2980 ml  Net -1243.35 ml   Filed Weights   11/30/19 0500 12/01/19 0431 12/02/19 0500  Weight: 95.1 kg 92.9 kg 89.9 kg    Examination: General 54 year old male, no distress.  HENT NCAT no JVD Pulm scattered rhonchi. No accessory use Right 28 fr chest tube w/ 4-5 airleak. W/ bloody purulent appearing pleural fluid  Oxygen requirements 5 LPM Card RRR abd not tender Ext warm dry w/ strong pulses.  Neuro intact w/ equal st bilaterally  Resolved Hospital Problem list    Assessment & Plan:    Acute Hypoxic respiratory failure 2/2 Covid pneumonia c/b MSSA PNA, empyema and bronchopleural fistula -has completed Remdesivir and systemic steroids  -intubated 1/17 felt 2/2 possible CT malfxn (kinked OR obstructed).  -46 French chest tube placed 1/18 Portable chest x-ray personally reviewed: Overpenetrated.  Right greater than left airspace disease persists, cannot exclude element of edema.  The right 28 French chest tube appears in satisfactory position, the small bore chest tube appears to be in the gutter.  There is a small apical pneumothorax that persists.  Plan Wean oxygen  Continuous pulse oximetry Incentive spirometry  Mobilization  Keep chest tube to suction at 30 cmH2O  We will remove small bore chest tube today  Daily assessment for diuresis Ancef as addressed below under MSSA bacteremia  A.m. chest x-ray   Small Acute  PE Plan Continuing systemic low molecular weight heparin  MSSA Bacteremia ->blood cultures from repeat on 1/12 negative  Plan Continuing Ancef, day 1 started 1/13 with plan to complete 4 weeks therapy as directed by infectious disease We will consider imaging of spine once more mobile to ensure no additional possible nidus for disease as recommended by infectious disease service   Intermittent fluid electrolyte imbalance: Plan We will continue to monitor and replace as indicated  Hyperglycemia Plan Continue before meals and at bedtime coverage Increasing Levemir to 14 units daily   Best practice:  DVT: heparin gtt Diet: per TRH  TFs protonix Full code Wife updated My cct 33 min   Simonne Martinet ACNP-BC [facility] Pulmonary/Critical Care Pager # [phone] OR # [phone] if no answer

## 2019-12-02 NOTE — Consult Note (Signed)
Physical Medicine and Rehabilitation Consult Reason for Consult: Impaired mobility and ADLs Referring Physician: Dr. Lynnell Catalan   HPI: Nathaniel Webb is a 54 y.o. male with obesity, HTN, DM who presented to [facility] on 12/29 with a 1 week history of gradually worsening SOB and was founf to have acute hypoxic respiratory failure requiring HFNC. COVID positive on 12/23 and CXR showed COVID pneumonitis. Patient was d/c home with spouse. On 1/10 CXR was found to have ARDS with bilateral increasing opacification with worsening respiratory status concerning for aspiration. Was found to have acute PE, hydropneumothorax, and loculated right pleural effusion requiring chest tube placement. He was intubated from 1/17 to 1/18. Currently minA bed mobility, transfers, able to tolerate a few steps of ambulation.    ROS +constipation, SOB, fullness in ears. Otherwise negative.  Past Medical History:  Diagnosis Date   Diabetes mellitus without complication (HCC)    Heel pain 06/09/2015   High triglycerides 05/09/2016   Hypertension    Low HDL (under 40) 05/09/2016   Obesity, Class II, BMI 35-39.9, with comorbidity (HCC) 01/04/2016   History reviewed. No pertinent surgical history. Family History  Problem Relation Age of Onset   Diabetes Father    COPD Mother    Social History:  reports that he has quit smoking. He has never used smokeless tobacco. He reports that he does not drink alcohol or use drugs. Allergies: No Known Allergies Medications Prior to Admission  Medication Sig Dispense Refill   acetaminophen (TYLENOL) 325 MG tablet Take 2 tablets (650 mg total) by mouth every 6 (six) hours as needed for mild pain or headache (fever >/= 101).     aspirin EC 81 MG tablet Take 81 mg by mouth daily.     atorvastatin (LIPITOR) 10 MG tablet Take 10 mg by mouth every evening.     lisinopril (ZESTRIL) 20 MG tablet Take 20 mg by mouth daily.     metFORMIN (GLUCOPHAGE) 500 MG tablet Take  500 mg by mouth daily with breakfast.     Omega-3 Fatty Acids (FISH OIL) 1000 MG CAPS Take 1,000 mg by mouth daily.     ascorbic acid (VITAMIN C) 500 MG tablet Take 1 tablet (500 mg total) by mouth daily.     azithromycin 500 mg in sodium chloride 0.9 % 250 mL Inject 500 mg into the vein daily.     cefTRIAXone 1 g in sodium chloride 0.9 % 100 mL Inject 1 g into the vein daily.     guaiFENesin-dextromethorphan (ROBITUSSIN DM) 100-10 MG/5ML syrup Take 10 mLs by mouth every 4 (four) hours as needed for cough. 118 mL 0   heparin 25000-0.45 UT/250ML-% infusion Inject 1,400 Units/hr into the vein continuous.     insulin aspart (NOVOLOG) 100 UNIT/ML injection Inject 0-9 Units into the skin every 4 (four) hours. 10 mL 11   ondansetron (ZOFRAN) 4 MG tablet Take 1 tablet (4 mg total) by mouth every 6 (six) hours as needed for nausea. 20 tablet 0   ondansetron (ZOFRAN) 4 MG/2ML SOLN injection Inject 2 mLs (4 mg total) into the vein every 6 (six) hours as needed for nausea. 2 mL 0   sodium chloride flush (NS) 0.9 % SOLN Inject 3 mLs into the vein every 12 (twelve) hours.     zinc sulfate 220 (50 Zn) MG capsule Take 1 capsule (220 mg total) by mouth daily.      Home: Home Living Family/patient expects to be discharged to:: Private  residence Living Arrangements: Spouse/significant other Available Help at Discharge: Family Type of Home: Apartment Home Access: Level entry Home Layout: One level Bathroom Shower/Tub: Tub/shower unit Home Equipment: None  Functional History: Prior Function Level of Independence: Independent Functional Status:  Mobility: Bed Mobility Overal bed mobility: Needs Assistance Bed Mobility: Rolling, Sidelying to Sit, Sit to Sidelying Rolling: Min assist Sidelying to sit: Min assist, HOB elevated Supine to sit: Supervision Sit to supine: Supervision Sit to sidelying: Min assist, HOB elevated General bed mobility comments: Min assist to help support trunk during  transitions, legs on return to bed.  Transfers Overall transfer level: Needs assistance Equipment used: 1 person hand held assist(bed rail on R hand held assist on L) Transfers: Sit to/from Stand Sit to Stand: Min assist General transfer comment: Pt able to stand EOB after a prolonged period sitting EOB to attempt to catch his breath/calm his breathing.  Pt able to take two side steps to [facility] before returing to sidelying.  Ambulation/Gait General Gait Details: unable to tolerate at this time due to WOB/SOB with just EOB mobility.     ADL:    Cognition: Cognition Overall Cognitive Status: Within Functional Limits for tasks assessed Orientation Level: Oriented to person, Oriented to place, Oriented to situation, Oriented to time Cognition Arousal/Alertness: Awake/alert Behavior During Therapy: [facility] for tasks assessed/performed Overall Cognitive Status: Within Functional Limits for tasks assessed General Comments: mildly anxious during mobility  Blood pressure 118/81, pulse 98, temperature 99.9 F (37.7 C), temperature source Axillary, resp. rate (!) 32, height 5\' 5"  (1.651 m), weight 89.9 kg, SpO2 (!) 89 %. Physical Exam  Gen: no distress, normal appearing HEENT: oral mucosa pink and moist, NCAT Cardio: tachycardic.  Chest: Increased work of breathing. [place] in place. CT in place.  Abd: soft, non-distended Ext: no edema Skin: CT site C/D/I Neuro: AOx3 Musculoskeletal: 4/5 strength throughout, no focal deficits. Sit to stand with MinA x2. Unable to tolerate ambulation due to desaturation.  Psych: pleasant, normal affect  Results for orders placed or performed during the hospital encounter of 11/11/19 (from the past 24 hour(s))  Glucose, capillary     Status: Abnormal   Collection Time: 12/01/19  5:11 PM  Result Value Ref Range   Glucose-Capillary 157 (H) 70 - 99 mg/dL  Glucose, capillary     Status: Abnormal   Collection Time: 12/01/19  7:38 PM  Result Value Ref Range    Glucose-Capillary 153 (H) 70 - 99 mg/dL  Glucose, capillary     Status: Abnormal   Collection Time: 12/01/19 11:38 PM  Result Value Ref Range   Glucose-Capillary 205 (H) 70 - 99 mg/dL  C-reactive protein     Status: Abnormal   Collection Time: 12/02/19  2:12 AM  Result Value Ref Range   CRP 8.1 (H) <1.0 mg/dL  D-dimer, quantitative (not at [facility])     Status: Abnormal   Collection Time: 12/02/19  2:12 AM  Result Value Ref Range   D-Dimer, Quant 2.91 (H) 0.00 - 0.50 ug/mL-FEU  Ferritin     Status: Abnormal   Collection Time: 12/02/19  2:12 AM  Result Value Ref Range   Ferritin 1,374 (H) 24 - 336 ng/mL  Comprehensive metabolic panel in am     Status: Abnormal   Collection Time: 12/02/19  2:12 AM  Result Value Ref Range   Sodium 139 135 - 145 mmol/L   Potassium 4.2 3.5 - 5.1 mmol/L   Chloride 99 98 - 111 mmol/L   CO2 27 22 -  32 mmol/L   Glucose, Bld 172 (H) 70 - 99 mg/dL   BUN 28 (H) 6 - 20 mg/dL   Creatinine, Ser 9.45 0.61 - 1.24 mg/dL   Calcium 7.8 (L) 8.9 - 10.3 mg/dL   Total Protein 6.6 6.5 - 8.1 g/dL   Albumin 2.1 (L) 3.5 - 5.0 g/dL   AST 038 (H) 15 - 41 U/L   ALT 166 (H) 0 - 44 U/L   Alkaline Phosphatase 170 (H) 38 - 126 U/L   Total Bilirubin 0.4 0.3 - 1.2 mg/dL   GFR calc non Af Amer >60 >60 mL/min   GFR calc Af Amer >60 >60 mL/min   Anion gap 13 5 - 15  am cbc     Status: Abnormal   Collection Time: 12/02/19  2:12 AM  Result Value Ref Range   WBC 19.3 (H) 4.0 - 10.5 K/uL   RBC 4.38 4.22 - 5.81 MIL/uL   Hemoglobin 11.4 (L) 13.0 - 17.0 g/dL   HCT 88.2 (L) 80.0 - 34.9 %   MCV 82.0 80.0 - 100.0 fL   MCH 26.0 26.0 - 34.0 pg   MCHC 31.8 30.0 - 36.0 g/dL   RDW 17.9 (H) 15.0 - 56.9 %   Platelets 318 150 - 400 K/uL   nRBC 0.6 (H) 0.0 - 0.2 %  Glucose, capillary     Status: Abnormal   Collection Time: 12/02/19  3:40 AM  Result Value Ref Range   Glucose-Capillary 127 (H) 70 - 99 mg/dL  Glucose, capillary     Status: Abnormal   Collection Time: 12/02/19  7:19 AM  Result  Value Ref Range   Glucose-Capillary 150 (H) 70 - 99 mg/dL  Glucose, capillary     Status: Abnormal   Collection Time: 12/02/19 11:27 AM  Result Value Ref Range   Glucose-Capillary 289 (H) 70 - 99 mg/dL   DG Chest Port 1 View  Result Date: 12/02/2019 CLINICAL DATA:  Pneumonia EXAM: PORTABLE CHEST 1 VIEW COMPARISON:  12/01/2019 FINDINGS: Two right-sided chest tubes are noted. There is a small right-sided pneumothorax, slightly has improved from prior study. Bilateral airspace opacities are noted. Subcutaneous gas is noted along the patient's right flank. There is increased density along the right chest wall which may represent a right-sided pleural effusion. No evidence for left-sided pneumothorax. Hazy bilateral airspace opacities are noted. IMPRESSION: 1. Lines and tubes as above. 2. Persistent small right-sided pneumothorax which has decreased at the right lung apex but increased at the right lung base. 3. Increasing right-sided pleural effusion. 4. Persistent bilateral ground-glass airspace opacities. 5. Worsening subcutaneous gas along the patient's right flank. Electronically Signed   By: Katherine Mantle M.D.   On: 12/02/2019 03:52   DG CHEST PORT 1 VIEW  Result Date: 12/01/2019 CLINICAL DATA:  Chest tube placement EXAM: PORTABLE CHEST 1 VIEW COMPARISON:  December 01, 2019 FINDINGS: A pigtail catheter projects over the right lung base. New right chest tube projects over the right mid lung. The right-sided pneumothorax is smaller in the interval, measuring 2.4 cm at the apex. No left-sided pneumothorax. The cardiomediastinal silhouette is stable. The ETT terminates 1.6 cm above the carina. The NG tube terminates below today's film. Mild edema. IMPRESSION: 1. Right chest tube placement. A right-sided pneumothorax remains but is smaller in the interval. 2. The ETT terminates 1.6 cm above the carina. Recommend withdrawing 1 cm. 3. Probable mild edema. These results will be called to the ordering  clinician or representative by the Radiologist Assistant,  and communication documented in the PACS or zVision Dashboard. Electronically Signed   By: Gerome Sam III M.D   On: 12/01/2019 11:36   DG Chest Port 1 View  Result Date: 12/01/2019 CLINICAL DATA:  Respiratory failure EXAM: PORTABLE CHEST 1 VIEW COMPARISON:  11/30/2019 FINDINGS: No significant change in a moderate, multiloculated layering right hydropneumothorax with pigtail drainage catheter about the right lung base. Slightly improved heterogeneous airspace opacity and consolidation of the left lung. Endotracheal tube and esophagogastric tube remain in position. Mild cardiomegaly. IMPRESSION: 1. No significant change in a moderate, multiloculated layering right hydropneumothorax with pigtail drainage catheter about the right lung base. 2. Slightly improved heterogeneous airspace opacity and consolidation of the left lung. 3.  Endotracheal tube and esophagogastric tube remain in position. Electronically Signed   By: Lauralyn Primes M.D.   On: 12/01/2019 08:13   Assessment/Plan: Diagnosis: Acute respiratory failure with hypoxemia 1. Does the need for close, 24 hr/day medical supervision in concert with the patient's rehab needs make it unreasonable for this patient to be served in a less intensive setting? Yes 2. Co-Morbidities requiring supervision/potential complications: DM2 with peripheral neuropathy, HTN, morbid obesity, COVID-19 pneumonia, acute pulmonary embolus, demand ischemia, constipation, loculated pleural effusion, MSSA bacteremia, lumg empyema 3. Due to bladder management, bowel management, safety, skin/wound care, disease management, medication administration, pain management and patient education, does the patient require 24 hr/day rehab nursing? Yes 4. Does the patient require coordinated care of a physician, rehab nurse, therapy disciplines of PT, OT, SLP to address physical and functional deficits in the context of the above  medical diagnosis(es)? Yes Addressing deficits in the following areas: balance, endurance, locomotion, strength, transferring, bowel/bladder control, bathing, dressing, feeding, grooming, toileting, cognition, speech, language, swallowing and psychosocial support 5. Can the patient actively participate in an intensive therapy program of at least 3 hrs of therapy per day at least 5 days per week? Yes 6. The potential for patient to make measurable gains while on inpatient rehab is good 7. Anticipated functional outcomes upon discharge from inpatient rehab are modified independent  with PT, modified independent with OT, independent with SLP. 8. Estimated rehab length of stay to reach the above functional goals is: 12-14 days 9. Anticipated discharge destination: Home 10. Overall Rehab/Functional Prognosis: good  RECOMMENDATIONS: This patient's condition is appropriate for continued rehabilitative care in the following setting: CIR Patient has agreed to participate in recommended program. Yes Note that insurance prior authorization may be required for reimbursement for recommended care.  Comment: Nathaniel Webb would be an excellent CIR candidate once medically stable and no longer requiring chest tube. He is more than 20 days out from last COVID positive test.  Constipation: Consider making Miralax scheduled BID. If no BM by tomorrow morning, can consider mag citrate.  Fullness in ears: Consider Debrox ear drops for possible ear wax.   Horton Chin, MD 12/02/2019

## 2019-12-02 NOTE — Progress Notes (Signed)
ANTICOAGULATION CONSULT NOTE - Follow Up Consult  Pharmacy Consult for Lovenox Indication: PE  No Known Allergies  Patient Measurements: Height: 5\' 5"  (165.1 cm) Weight: 198 lb 3.1 oz (89.9 kg) IBW/kg (Calculated) : 61.5 Heparin Dosing Weight: 85 kg  Vital Signs: BP: 134/92 (01/19 0800) Pulse Rate: 120 (01/19 0800)  Labs: Recent Labs    11/30/19 0442 11/30/19 0442 11/30/19 0551 11/30/19 0551 12/01/19 0400 12/02/19 0212  HGB 10.8*   < > 11.6*   < > 11.5* 11.4*  HCT 34.0*   < > 34.0*  --  36.2* 35.9*  PLT 226  --   --   --  257 318  CREATININE 1.30*  --   --   --  1.14 1.15   < > = values in this interval not displayed.    Estimated Creatinine Clearance: 77.5 mL/min (by C-G formula based on SCr of 1.15 mg/dL).   Medications:  Scheduled:  . vitamin C  500 mg Per Tube Daily  . chlorhexidine  15 mL Mouth/Throat BID  . Chlorhexidine Gluconate Cloth  6 each Topical Daily  . chlorpheniramine-HYDROcodone  5 mL Oral Q12H  . enoxaparin (LOVENOX) injection  1 mg/kg Subcutaneous Q12H  . feeding supplement (ENSURE ENLIVE)  237 mL Oral TID BM  . feeding supplement (VITAL HIGH PROTEIN)  1,000 mL Per Tube Q24H  . insulin aspart  0-20 Units Subcutaneous TID WC  . insulin detemir  10 Units Subcutaneous Daily  . mouth rinse  15 mL Mouth Rinse BID  . metoprolol tartrate  50 mg Per Tube BID  . pantoprazole sodium  40 mg Per Tube Q1200  . pneumococcal 23 valent vaccine  0.5 mL Intramuscular Tomorrow-1000  . predniSONE  30 mg Per Tube Q breakfast  . sodium chloride flush  3 mL Intravenous Q12H  . zinc sulfate  220 mg Per Tube Daily   Infusions:  . sodium chloride Stopped (12/01/19 1456)  .  ceFAZolin (ANCEF) IV Stopped (12/02/19 0552)  . phenylephrine (NEO-SYNEPHRINE) Adult infusion      Assessment: 54 yo male with COVID pneumonitis started on Eliquis for PE, held for thoracentesis 1/12 and chest tube placement on 1/13.    Currently on treatment dose Lovenox s/p insertion of  second chest tube on 1/18. H/H and Plt wnl. SCr wnl. Of note, patient's weight has dropped to 89.9 kg today.   Goal of Therapy:  Anti-Xa level 0.6-1 units/ml 4hrs after LMWH dose given Monitor platelets by anticoagulation protocol: Yes   Plan:  Continue lovenox 1 mg/kg [place] q12h. Will adjust dose to 90 mg today  Follow up renal function, CBC Follow up transition back to long-term anticoagulation.  2/18, PharmD., BCPS Clinical Pharmacist Clinical phone for 12/02/19 until 5pm: [phone]

## 2019-12-02 NOTE — Progress Notes (Signed)
Rehab Admissions Coordinator Note:  Per PT recommendation, this patient was screened by Cheri Rous for appropriateness for an Inpatient Acute Rehab Consult.  At this time, we are recommending an Inpatient Rehab consult.  Please have attending MD place consult order to we can follow along and further assess his candidacy for possible IP Rehab placement.   Cheri Rous 12/02/2019, 4:31 PM  I can be reached at [phone].

## 2019-12-02 NOTE — Progress Notes (Signed)
Physical Therapy Treatment Patient Details Name: Nathaniel Webb MRN: [id number] DOB: February 25, 1966 Today's Date: 12/02/2019    History of Present Illness 54 y/o male w/ hx of Obesity, low HDL, HTN, DM, presented to [facility] (12/29) with approximately 1 week history of gradually worsening shortness of breath.  He was found to have acute hypoxic respiratory failure and was requiring high flow oxygen to maintain O2 saturations.  Chest x-ray was typical of Covid pneumonitis. Pt dx w/ COVID 12/23 and dc home w/ spouse. 1/10 CXR showed ARDS bilateral increasing opacification; respiratory status worsened; concerns for aspiration. Acute PE; loculated right pleural effusion; 1/11 CTS consult, recs chest tube. 1/12 CT chest with densely consolidated right lung, air fluid level/ hydropneumothorax. Thoracentesis 1/12; pleural fluid sampled and concerning for empyema.  1/13 emergently transferred to ICU for worsening hypoxia; CT showed worsening right hydropneumothorax with loculations in pleura. Emergent right chest tube placed. Pt intubated 11/30/19-12/01/19.    PT Comments    Pt extubated yesterday PM and has R sided chest tube.  He has low tolerance of activity and is on 5 L O2 [place] maintain sats in the low 80s during mobility, with increased WOB/accessory, belly taking 5-8 mins to recover from just EOB mobility.  He had already been up OOB for a few hours when I saw him, so I repositioned him back in bed and reviewed his breathing exercises.  I am hopeful he can go to CIR for intensive post acute therapy as I think his activity tolerance will take quite a while to regain after all he has been through here and will likely be a slow wean.  PT will continue to follow acutely for safe mobility progression.    Follow Up Recommendations  CIR      Equipment Recommendations  Rolling walker with 5" wheels;Other (comment)(home O2 likely)    Recommendations for Other Services OT consult     Precautions / Restrictions  Precautions Precautions: Fall;Other (comment) Precaution Comments: R sided Chest tube, O2 with increased WOB Restrictions Weight Bearing Restrictions: No    Mobility  Bed Mobility Overal bed mobility: Needs Assistance Bed Mobility: Rolling;Sidelying to Sit;Sit to Sidelying Rolling: Min assist Sidelying to sit: Min assist;HOB elevated     Sit to sidelying: Min assist;HOB elevated General bed mobility comments: Min assist to help support trunk during transitions, legs on return to bed.   Transfers Overall transfer level: Needs assistance Equipment used: 1 person hand held assist(bed rail on R hand held assist on L) Transfers: Sit to/from Stand Sit to Stand: Min assist         General transfer comment: Pt able to stand EOB after a prolonged period sitting EOB to attempt to catch his breath/calm his breathing.  Pt able to take two side steps to [facility] before returing to sidelying.   Ambulation/Gait             General Gait Details: unable to tolerate at this time due to WOB/SOB with just EOB mobility.           Balance Overall balance assessment: Needs assistance Sitting-balance support: Feet supported;Bilateral upper extremity supported Sitting balance-Leahy Scale: Fair     Standing balance support: Bilateral upper extremity supported Standing balance-Leahy Scale: Poor Standing balance comment: needs external assist and did not stand longer than 30 seconds.                             Cognition Arousal/Alertness: Awake/alert  Behavior During Therapy: WFL for tasks assessed/performed Overall Cognitive Status: Within Functional Limits for tasks assessed                                 General Comments: mildly anxious during mobility      Exercises Other Exercises Other Exercises: IS and FV x 10 each. Cues for IS to slow his inhale.  Max observed volume 600 mL    General Comments General comments (skin integrity, edema, etc.): Pt on 5  L O2 [place], RR 30-40, O2 sats drop to low 80s and take 5-8 mins to recover after mobility.        Pertinent Vitals/Pain Pain Assessment: Faces Faces Pain Scale: Hurts little more Pain Location: R chest wall Pain Descriptors / Indicators: Grimacing;Guarding Pain Intervention(s): Limited activity within patient's tolerance;Monitored during session;Repositioned           PT Goals (current goals can now be found in the care plan section) Acute Rehab PT Goals Patient Stated Goal: to get better PT Goal Formulation: With patient Time For Goal Achievement: 12/16/19 Potential to Achieve Goals: Good Progress towards PT goals: Not progressing toward goals - comment(medical decline, but getting better now)    Frequency    Min 3X/week      PT Plan Discharge plan needs to be updated       AM-PAC PT "6 Clicks" Mobility   Outcome Measure  Help needed turning from your back to your side while in a flat bed without using bedrails?: A Little Help needed moving from lying on your back to sitting on the side of a flat bed without using bedrails?: A Little Help needed moving to and from a bed to a chair (including a wheelchair)?: A Little Help needed standing up from a chair using your arms (e.g., wheelchair or bedside chair)?: A Little Help needed to walk in hospital room?: A Lot Help needed climbing 3-5 steps with a railing? : Total 6 Click Score: 15    End of Session Equipment Utilized During Treatment: Oxygen Activity Tolerance: Patient limited by fatigue Patient left: in bed;with call bell/phone within reach   PT Visit Diagnosis: Unsteadiness on feet (R26.81);Muscle weakness (generalized) (M62.81)     Time: 2229-7989 PT Time Calculation (min) (ACUTE ONLY): 33 min  Charges:  $Therapeutic Activity: 23-37 mins                    Corinna Capra, PT, DPT  Acute Rehabilitation [phone] pager #[phone] office  @ Lynnell Catalan: [phone]    12/02/2019, 2:43  PM

## 2019-12-03 ENCOUNTER — Inpatient Hospital Stay (HOSPITAL_COMMUNITY): Payer: Medicaid Other

## 2019-12-03 ENCOUNTER — Other Ambulatory Visit: Payer: Self-pay

## 2019-12-03 DIAGNOSIS — J869 Pyothorax without fistula: Secondary | ICD-10-CM

## 2019-12-03 LAB — CBC
HCT: 33.8 % — ABNORMAL LOW (ref 39.0–52.0)
Hemoglobin: 11.1 g/dL — ABNORMAL LOW (ref 13.0–17.0)
MCH: 26.6 pg (ref 26.0–34.0)
MCHC: 32.8 g/dL (ref 30.0–36.0)
MCV: 81.1 fL (ref 80.0–100.0)
Platelets: 302 10*3/uL (ref 150–400)
RBC: 4.17 MIL/uL — ABNORMAL LOW (ref 4.22–5.81)
RDW: 16.2 % — ABNORMAL HIGH (ref 11.5–15.5)
WBC: 26.8 10*3/uL — ABNORMAL HIGH (ref 4.0–10.5)
nRBC: 0.6 % — ABNORMAL HIGH (ref 0.0–0.2)

## 2019-12-03 LAB — GLUCOSE, CAPILLARY
Glucose-Capillary: 102 mg/dL — ABNORMAL HIGH (ref 70–99)
Glucose-Capillary: 101 mg/dL — ABNORMAL HIGH (ref 70–99)
Glucose-Capillary: 192 mg/dL — ABNORMAL HIGH (ref 70–99)
Glucose-Capillary: 249 mg/dL — ABNORMAL HIGH (ref 70–99)
Glucose-Capillary: 192 mg/dL — ABNORMAL HIGH (ref 70–99)

## 2019-12-03 LAB — D-DIMER, QUANTITATIVE: D-Dimer, Quant: 3.08 ug/mL-FEU — ABNORMAL HIGH (ref 0.00–0.50)

## 2019-12-03 LAB — COMPREHENSIVE METABOLIC PANEL
ALT: 133 U/L — ABNORMAL HIGH (ref 0–44)
AST: 73 U/L — ABNORMAL HIGH (ref 15–41)
Albumin: 2.1 g/dL — ABNORMAL LOW (ref 3.5–5.0)
Alkaline Phosphatase: 159 U/L — ABNORMAL HIGH (ref 38–126)
Anion gap: 11 (ref 5–15)
BUN: 28 mg/dL — ABNORMAL HIGH (ref 6–20)
CO2: 29 mmol/L (ref 22–32)
Calcium: 8.1 mg/dL — ABNORMAL LOW (ref 8.9–10.3)
Chloride: 98 mmol/L (ref 98–111)
Creatinine, Ser: 1.09 mg/dL (ref 0.61–1.24)
GFR calc Af Amer: >60 mL/min (ref >60)
GFR calc non Af Amer: >60 mL/min (ref >60)
Glucose, Bld: 116 mg/dL — ABNORMAL HIGH (ref 70–99)
Potassium: 4.3 mmol/L (ref 3.5–5.1)
Sodium: 138 mmol/L (ref 135–145)
Total Bilirubin: 0.5 mg/dL (ref 0.3–1.2)
Total Protein: 6.8 g/dL (ref 6.5–8.1)

## 2019-12-03 LAB — FERRITIN: Ferritin: 998 ng/mL — ABNORMAL HIGH (ref 24–336)

## 2019-12-03 LAB — C-REACTIVE PROTEIN: CRP: 9.2 mg/dL — ABNORMAL HIGH (ref <1.0)

## 2019-12-03 LAB — TRIGLYCERIDES: Triglycerides: 212 mg/dL — ABNORMAL HIGH (ref <150)

## 2019-12-03 MED ORDER — FUROSEMIDE 10 MG/ML IJ SOLN
40.0000 mg | Freq: Two times a day (BID) | INTRAMUSCULAR | Status: AC
  Administered 2019-12-03 (×2): 40 mg via INTRAVENOUS
  Filled 2019-12-03 (×2): qty 4

## 2019-12-03 MED ORDER — ASPIRIN EC 81 MG PO TBEC
81.0000 mg | DELAYED_RELEASE_TABLET | Freq: Every day | ORAL | Status: DC
  Administered 2019-12-03 – 2019-12-17 (×15): 81 mg via ORAL
  Filled 2019-12-03 (×15): qty 1

## 2019-12-03 MED ORDER — ATORVASTATIN CALCIUM 10 MG PO TABS
10.0000 mg | ORAL_TABLET | Freq: Every evening | ORAL | Status: DC
  Administered 2019-12-03 – 2019-12-17 (×13): 10 mg via ORAL
  Filled 2019-12-03 (×14): qty 1

## 2019-12-03 NOTE — Progress Notes (Signed)
Assisted tele visit to patient with wife.  Cristen Murcia Anderson, RN   

## 2019-12-03 NOTE — Progress Notes (Signed)
Patient's wife, Margaretha Glassing and sister Malachi Bonds called and updated. They were able to spend time talking personally to Mr. Murad. All questions answered.

## 2019-12-03 NOTE — Progress Notes (Signed)
Occupational Therapy Evaluation Patient Details Name: Nathaniel Webb MRN: [id number] DOB: [birth year] Today's Date: 12/03/2019    History of Present Illness 54 y/o male w/ hx of Obesity, low HDL, HTN, DM, presented to [facility] (12/29) with approximately 1 week history of gradually worsening shortness of breath.  He was found to have acute hypoxic respiratory failure and was requiring high flow oxygen to maintain O2 saturations.  Chest x-ray was typical of Covid pneumonitis. Pt dx w/ COVID 12/23 and dc home w/ spouse. 1/10 CXR showed ARDS bilateral increasing opacification; respiratory status worsened; concerns for aspiration. Acute PE; loculated right pleural effusion; 1/11 CTS consult, recs chest tube. 1/12 CT chest with densely consolidated right lung, air fluid level/ hydropneumothorax. Thoracentesis 1/12; pleural fluid sampled and concerning for empyema.  1/13 emergently transferred to ICU for worsening hypoxia; CT showed worsening right hydropneumothorax with loculations in pleura. Emergent right chest tube placed. Pt intubated 11/30/19-12/01/19.   Clinical Impression   PTA pt lived with his wife, independent in ADL, IADL, and mobility tasks. Pt does not ambulate with an assistive device and reports 0 falls in the last 6 months. Pt does not use oxygen at home and is currently on 4L [place]. Pt tolerated sitting edge of bed 5+ min with supervision. Pt tolerated standing 1 x 1 min with RW and min assist while complete peri care. Pt able to stand pivot to bedside chair with min hand held assist. SpO2 decreased to low 80s with activity with pt requiring 2-3 min seated rest break to recover back to 90s. Pt reported mod shortness of breath throughout. Pt reported anxiety related to shortness of breath. Educated/instructed pt on relaxation strategies and breathing techniques with good understanding and follow through. Pt demonstrates decreased strength, endurance, balance, standing tolerance, and activity tolerance  impacting ability to complete self-care and functional transfer tasks. Recommend skilled OT services to address above deficits in order to promote function and prevent further decline. Recommend CIR placement for additional rehab prior to discharge home.     Follow Up Recommendations  CIR    Equipment Recommendations  Other (comment)(TBD at next venue of care)    Recommendations for Other Services       Precautions / Restrictions Precautions Precautions: Fall;Other (comment) Precaution Comments: R sided Chest tube, monitor SpO2 Restrictions Weight Bearing Restrictions: No      Mobility Bed Mobility Overal bed mobility: Needs Assistance Bed Mobility: Supine to Sit     Supine to sit: HOB elevated;Min guard     General bed mobility comments: Use of bedrail  Transfers Overall transfer level: Needs assistance Equipment used: Rolling walker (2 wheeled);1 person hand held assist Transfers: Sit to/from [employer] Sit to Stand: Min assist Stand pivot transfers: Min assist       General transfer comment: Utilized RW for sit/stand and hand held assist for stand pivot to bedside chair.    Balance Overall balance assessment: Needs assistance Sitting-balance support: Feet supported;Bilateral upper extremity supported Sitting balance-Leahy Scale: Fair       Standing balance-Leahy Scale: Poor                             ADL either performed or assessed with clinical judgement   ADL Overall ADL's : Needs assistance/impaired Eating/Feeding: Set up;Supervision/ safety;Sitting   Grooming: Set up;Supervision/safety;Sitting   Upper Body Bathing: Set up;Supervision/ safety;Sitting   Lower Body Bathing: Sit to/from stand;Sitting/lateral leans;Moderate assistance   Upper Body Dressing :  Set up;Supervision/safety;Sitting   Lower Body Dressing: Minimal assistance;Sitting/lateral leans;Sit to/from stand   Toilet Transfer: Minimal assistance;BSC    Toileting- Clothing Manipulation and Hygiene: Minimal assistance;Sit to/from stand;Sitting/lateral lean       Functional mobility during ADLs: Minimal assistance;+2 for safety/equipment General ADL Comments: Pt able to stand pivot to bedside chair with min hand held assist.     Vision Baseline Vision/History: Wears glasses Wears Glasses: Reading only       Perception     Praxis      Pertinent Vitals/Pain Pain Assessment: 0-10 Pain Score: 8  Pain Location: R chest wall Pain Descriptors / Indicators: Grimacing;Guarding Pain Intervention(s): Limited activity within patient's tolerance;Monitored during session;Repositioned     Hand Dominance Right   Extremity/Trunk Assessment Upper Extremity Assessment Upper Extremity Assessment: Generalized weakness   Lower Extremity Assessment Lower Extremity Assessment: Defer to PT evaluation       Communication Communication Communication: No difficulties   Cognition Arousal/Alertness: Awake/alert Behavior During Therapy: WFL for tasks assessed/performed Overall Cognitive Status: Within Functional Limits for tasks assessed                                     General Comments  Pt on 4L [place] with SpO2 93% at rest. SpO2 decreased to low 80s with mobility. Pt required 2-3 min seated rest break to return to 90s. Pt reported mod SOB throughout.    Exercises Exercises: Other exercises Other Exercises Other Exercises: Incentive spirometer x 10 with min cues on technique. Pulling 795mL, Other Exercises: Flutter valve x 10 with min cues on technique.   Shoulder Instructions      Home Living Family/patient expects to be discharged to:: Private residence Living Arrangements: Spouse/significant other Available Help at Discharge: Family Type of Home: Apartment(1st floor) Home Access: Level entry     Home Layout: One level     Bathroom Shower/Tub: Teacher, early years/pre: Standard     Home Equipment:  None          Prior Functioning/Environment Level of Independence: Independent        Comments: Pt independent with ADLs, IADLs, and mobility. Pt does not ambulate with an assistive device. Pt reports 0 falls in the last 6 months. Pt  does not use oxygen at home.        OT Problem List: Decreased strength;Decreased activity tolerance;Impaired balance (sitting and/or standing);Cardiopulmonary status limiting activity      OT Treatment/Interventions: Self-care/ADL training;Therapeutic exercise;Neuromuscular education;Energy conservation;DME and/or AE instruction;Therapeutic activities;Patient/family education;Balance training    OT Goals(Current goals can be found in the care plan section) Acute Rehab OT Goals Patient Stated Goal: to go home Time For Goal Achievement: 12/17/19 Potential to Achieve Goals: Good ADL Goals Pt Will Perform Grooming: with supervision;standing Pt Will Perform Lower Body Bathing: with supervision;sit to/from stand Pt Will Perform Lower Body Dressing: with supervision;sit to/from stand Pt Will Transfer to Toilet: with supervision;ambulating;regular height toilet Pt Will Perform Toileting - Clothing Manipulation and hygiene: with supervision;sit to/from stand Additional ADL Goal #1: Pt to tolerate standing up to 5 min with supervision, in preparation for ADLs. Additional ADL Goal #2: Pt to recall and verbalize 3 relaxation strategies with 0 verbal cues. Additional ADL Goal #3: Pt to recall and verbalize 3 energy conservation techniques with 0 verbal cues.  OT Frequency: Min 3X/week   Barriers to D/C:  Co-evaluation              AM-PAC OT "6 Clicks" Daily Activity     Outcome Measure Help from another person eating meals?: A Little Help from another person taking care of personal grooming?: A Little Help from another person toileting, which includes using toliet, bedpan, or urinal?: A Little Help from another person bathing (including  washing, rinsing, drying)?: A Lot Help from another person to put on and taking off regular upper body clothing?: A Little Help from another person to put on and taking off regular lower body clothing?: A Little 6 Click Score: 17   End of Session Equipment Utilized During Treatment: Rolling walker;Oxygen Nurse Communication: Mobility status  Activity Tolerance: Patient limited by fatigue(Limited by SOB) Patient left: in chair;with call bell/phone within reach;with nursing/sitter in room  OT Visit Diagnosis: Unsteadiness on feet (R26.81);Muscle weakness (generalized) (M62.81)                Time: 2951-8841 OT Time Calculation (min): 38 min Charges:  OT General Charges $OT Visit: 1 Visit OT Evaluation $OT Eval Moderate Complexity: 1 Mod OT Treatments $Self Care/Home Management : 8-22 mins $Therapeutic Activity: 8-22 mins  Peterson Ao OTR/L [phone]   Peterson Ao 12/03/2019, 3:46 PM

## 2019-12-03 NOTE — Progress Notes (Addendum)
NAME:  AYDRIAN HALPIN, MRN:  [id number], DOB:  Oct 25, 1966, LOS: 22 ADMISSION DATE:  11/11/2019, CONSULTATION DATE:  1/12 REFERRING MD:  Joseph Art, CHIEF COMPLAINT:  Pleural effusion   Brief History   54 y/o male admitted with COVID pneumonia on 12/29, developed loculated pleural effusion by 1/10 with associated hydropneumothorax.    Past Medical History  DM II , HTN, Morbid Obesity  Significant Hospital Events   12/29 Admit  1/12 PCCM consulted, thora performed 1/13 Tx to ICU with worsening pneumothorax  1/17 intubated  1/18: Continues to have persistent right-sided pneumothorax in spite of chest tube. New 28 french placed -->extubated later that evening 1/19 small bore ct removed getting lasix. Feeling better and stronger. airleak still brisk 1/20 O2 down to 4 liters but still desaturates. CT chest ordered to eval pleural space/empyema   Consults:  PCCM  Procedures:  1/13 chest tube R side > small bore removed 1/19 1/17 ETT >> 1/18: 28 french chest tube placed  Significant Diagnostic Tests:  CTA Chest 12/29 >> positive for PE with small volume embolus in the distal right pulmonary artery, lobar, and some segmental branches CT ABD/Pelvis 1/10 >> moderate stool burden in colon, R>L airspace disease compatible with PNA, loculated right pleural effusion CT Chest w/o 1/10 >> multiloculated right lung hydropneumothorax, larger volume of pleural fluid relative to pleural air, progression of multilobar right lung consolidation, small volume loculated right pneumomediastinum Thora 1/12 >> glucose <20, LD 2,447, protein 5.2, total nucleated cells 117,299, neutrophil 85 CT Chest w/o 1/13 >> limited exam by motion, large right hydropneumothorax with numerous thin internal septations with air fluid levels increased in size, complete right lung atelectasis, pneumomediastinum in the anterior right pericardial region, similar extensive patchy GGO on L  Micro Data:  BCID 1/10 >> MSSA BCx2 1/10 >>  MSSA  Pleural fluid culture 1/12 >> MSSA  Antimicrobials/COVID Rx  Remdesivir 12/27 > 1/1 Tocilizumab 12/28 >  Cefepime 1/10 >>1/14 Linezolid 1/10 >> 1/14 Cefazolin 1/14 >>  Interim history/subjective:  Feels weak but getting stronger. Still SOB when eating  Objective   Blood pressure (Abnormal) 120/91, pulse (Abnormal) 109, temperature 98.4 F (36.9 C), temperature source Oral, resp. rate (Abnormal) 32, height 5\' 5"  (1.651 m), weight 89.9 kg, SpO2 (Abnormal) 87 %.        Intake/Output Summary (Last 24 hours) at 12/03/2019 0739 Last data filed at 12/03/2019 0400 Gross per 24 hour  Intake 1820 ml  Output 2105 ml  Net -285 ml   Filed Weights   11/30/19 0500 12/01/19 0431 12/02/19 0500  Weight: 95.1 kg 92.9 kg 89.9 kg    Examination: General this is a 54 year old aam resting in bed. Eating currently not in acute distress but does get SOB still w/ minimal exertion  HENT NCAT no JVD MMM Pulm coarse scattered rhonchi; tactile frem  Chest tube w/ vigorous airleak 7/7  Oxygen requirements 4 lpm Card RRR no MRG abd not tender + bowel sounds  Ext no sig edema brisk CR  Neuro awake and alert. No focal def  gu clear yellow   Resolved Hospital Problem list    Assessment & Plan:    Acute Hypoxic respiratory failure 2/2 Covid pneumonia c/b MSSA PNA, empyema and bronchopleural fistula -has completed Remdesivir and systemic steroids  Portable chest x-ray personally reviewed Improved aeration, cardiomegaly, right chest tube in satisfactory position.  Reduced volume of pneumothorax appreciated.  Still present Plan Wean O2 pulm hygiene  Keep CT at 30 cm  H2O sxn Am CT chest today. Depending on findings may ask CT surg to eval. Might need to consider VATs.  cxr abx as below Daily assessment for diuresis  Steroids stopped  Small Acute PE Plan Cont LMWH  MSSA Bacteremia ->blood cultures from repeat on 1/12 negative  Plan Ancef to complete 4 week course starting 1/13 Will  need to consider additional imaging of spine to r/o other possible nidus of infection. Would like to see him a little stronger and the air leak on CT less brisk to better tolerate time off sxn    Intermittent fluid electrolyte imbalance: Plan Monitor and replace as indicated   Hyperglycemia Plan Cont ssi ac/hs and levimir at 14u daily    Best practice:  DVT: LMWH therapeutic dosing  Diet: per TRH  TFs protonix Full code Wife updated daily  My cct 67 min   Erick Colace ACNP-BC [place] Pager # [phone] OR # [phone] if no answer

## 2019-12-03 NOTE — Progress Notes (Signed)
  Subjective: Repeat CT chest shows improvement after well placed chest tube There is some remaining effusion, pleural thickening and viral pneumonitis. Would not recommend VATS at this time because of pneumonitis remaining in L lung would impair adequate oxygenation during surgery on R lung.Cont antibiotics for MSSA and chest tube suction. As the L lung recovers he would potentially benefit from R VATS later. Objective: Vital signs in last 24 hours: Temp:  [97.2 F (36.2 C)-98.9 F (37.2 C)] 98.5 F (36.9 C) (01/20 1619) Pulse Rate:  [86-121] 106 (01/20 1619) Cardiac Rhythm: Normal sinus rhythm (01/20 1619) Resp:  [17-24] 21 (01/20 1619) BP: (111-141)/(33-96) 114/77 (01/20 1619) SpO2:  [84 %-95 %] 87 % (01/20 1619)  Hemodynamic parameters for last 24 hours  Intake/Output from previous day: 01/19 0701 - 01/20 0700 In: 1820 [P.O.:1620; IV Piggyback:200] Out: 2105 [Urine:1825; Chest Tube:280] Intake/Output this shift: Total I/O In: 1203 [P.O.:1200; I.V.:3] Out: 1015 [Urine:1015]    Lab Results: Recent Labs    12/02/19 0212 12/03/19 0130  WBC 19.3* 26.8*  HGB 11.4* 11.1*  HCT 35.9* 33.8*  PLT 318 302   BMET:  Recent Labs    12/02/19 0212 12/03/19 0130  NA 139 138  K 4.2 4.3  CL 99 98  CO2 27 29  GLUCOSE 172* 116*  BUN 28* 28*  CREATININE 1.15 1.09  CALCIUM 7.8* 8.1*    PT/INR: No results for input(s): LABPROT, INR in the last 72 hours. ABG    Component Value Date/Time   PHART 7.446 11/30/2019 0551   HCO3 30.8 (H) 11/30/2019 0551   TCO2 32 11/30/2019 0551   ACIDBASEDEF 4.1 (H) 11/09/2019 2210   O2SAT 98.0 11/30/2019 0551   CBG (last 3)  Recent Labs    12/03/19 0714 12/03/19 1202 12/03/19 1642  GLUCAP 101* 192* 249*    Assessment/Plan: S/P  Continue with chest tube drainage -t3 weeks iv antibiotics   LOS: 22 days    Kathlee Nations Trigt III 12/03/2019

## 2019-12-03 NOTE — Progress Notes (Signed)
Patient has had episodes of excessive coughing causing pain to his CT site. Dressing intact and no change to the site. Medications given for cough and pain as documented.

## 2019-12-03 NOTE — Progress Notes (Signed)
Chaplain received consult re: pt request for bible.  Pt does not wish for chaplain visit.    Chaplain provided bible to nursing station for pt.    Burnis Kingfisher, MDiv, [facility]  Lead Clinical Chaplain  [facility], [facility].

## 2019-12-03 NOTE — Progress Notes (Signed)
Wife called and updated on plan of care.   Simonne Martinet ACNP-BC [facility] Pulmonary/Critical Care Pager # [phone] OR # [phone] if no answer

## 2019-12-04 ENCOUNTER — Other Ambulatory Visit: Payer: Medicaid Other

## 2019-12-04 ENCOUNTER — Inpatient Hospital Stay (HOSPITAL_COMMUNITY): Payer: Medicaid Other

## 2019-12-04 LAB — BASIC METABOLIC PANEL
Anion gap: 14 (ref 5–15)
BUN: 32 mg/dL — ABNORMAL HIGH (ref 6–20)
CO2: 29 mmol/L (ref 22–32)
Calcium: 7.9 mg/dL — ABNORMAL LOW (ref 8.9–10.3)
Chloride: 90 mmol/L — ABNORMAL LOW (ref 98–111)
Creatinine, Ser: 1.27 mg/dL — ABNORMAL HIGH (ref 0.61–1.24)
GFR calc Af Amer: >60 mL/min (ref >60)
GFR calc non Af Amer: >60 mL/min (ref >60)
Glucose, Bld: 130 mg/dL — ABNORMAL HIGH (ref 70–99)
Potassium: 4 mmol/L (ref 3.5–5.1)
Sodium: 133 mmol/L — ABNORMAL LOW (ref 135–145)

## 2019-12-04 LAB — CBC
HCT: 29 % — ABNORMAL LOW (ref 39.0–52.0)
Hemoglobin: 9.2 g/dL — ABNORMAL LOW (ref 13.0–17.0)
MCH: 26.1 pg (ref 26.0–34.0)
MCHC: 31.7 g/dL (ref 30.0–36.0)
MCV: 82.2 fL (ref 80.0–100.0)
Platelets: 326 10*3/uL (ref 150–400)
RBC: 3.53 MIL/uL — ABNORMAL LOW (ref 4.22–5.81)
RDW: 16.4 % — ABNORMAL HIGH (ref 11.5–15.5)
WBC: 29.5 10*3/uL — ABNORMAL HIGH (ref 4.0–10.5)
nRBC: 0.8 % — ABNORMAL HIGH (ref 0.0–0.2)

## 2019-12-04 LAB — GLUCOSE, CAPILLARY
Glucose-Capillary: 152 mg/dL — ABNORMAL HIGH (ref 70–99)
Glucose-Capillary: 107 mg/dL — ABNORMAL HIGH (ref 70–99)
Glucose-Capillary: 227 mg/dL — ABNORMAL HIGH (ref 70–99)
Glucose-Capillary: 180 mg/dL — ABNORMAL HIGH (ref 70–99)

## 2019-12-04 MED ORDER — ALPRAZOLAM 0.25 MG PO TABS
0.2500 mg | ORAL_TABLET | Freq: Once | ORAL | Status: AC
  Administered 2019-12-05: 0.25 mg via ORAL
  Filled 2019-12-04: qty 1

## 2019-12-04 MED ORDER — INSULIN DETEMIR 100 UNIT/ML ~~LOC~~ SOLN
18.0000 [IU] | Freq: Every day | SUBCUTANEOUS | Status: DC
  Administered 2019-12-04 – 2019-12-17 (×13): 18 [IU] via SUBCUTANEOUS
  Filled 2019-12-04 (×15): qty 0.18

## 2019-12-04 MED ORDER — SODIUM CHLORIDE (PF) 0.9 % IJ SOLN
10.0000 mg | Freq: Once | INTRAMUSCULAR | Status: DC
  Filled 2019-12-04: qty 10

## 2019-12-04 MED ORDER — STERILE WATER FOR INJECTION IJ SOLN
5.0000 mg | Freq: Once | RESPIRATORY_TRACT | Status: DC
  Filled 2019-12-04: qty 5

## 2019-12-04 MED ORDER — SODIUM CHLORIDE 0.9 % IV BOLUS
500.0000 mL | Freq: Once | INTRAVENOUS | Status: AC
  Administered 2019-12-04: 500 mL via INTRAVENOUS

## 2019-12-04 MED ORDER — SENNOSIDES-DOCUSATE SODIUM 8.6-50 MG PO TABS
3.0000 | ORAL_TABLET | Freq: Two times a day (BID) | ORAL | Status: DC
  Administered 2019-12-04 – 2019-12-17 (×13): 3 via ORAL
  Filled 2019-12-04 (×19): qty 3

## 2019-12-04 MED ORDER — SODIUM CHLORIDE 0.9 % IV BOLUS
500.0000 mL | Freq: Once | INTRAVENOUS | Status: AC
  Administered 2019-12-05: 500 mL via INTRAVENOUS

## 2019-12-04 MED ORDER — FLEET ENEMA 7-19 GM/118ML RE ENEM
1.0000 | ENEMA | Freq: Every day | RECTAL | Status: DC | PRN
  Filled 2019-12-04: qty 1

## 2019-12-04 MED ORDER — ENOXAPARIN SODIUM 100 MG/ML ~~LOC~~ SOLN
1.0000 mg/kg | Freq: Two times a day (BID) | SUBCUTANEOUS | Status: DC
  Administered 2019-12-05 – 2019-12-06 (×3): 90 mg via SUBCUTANEOUS
  Filled 2019-12-04 (×3): qty 1
  Filled 2019-12-04: qty 0.9

## 2019-12-04 NOTE — Progress Notes (Signed)
Physical Therapy Treatment Patient Details Name: Nathaniel Webb MRN: [id number] DOB: April 20, 1966 Today's Date: 12/04/2019    History of Present Illness 54 y/o male w/ hx of Obesity, low HDL, HTN, DM, presented to [facility] (12/29) with approximately 1 week history of gradually worsening shortness of breath.  He was found to have acute hypoxic respiratory failure and was requiring high flow oxygen to maintain O2 saturations.  Chest x-ray was typical of Covid pneumonitis. Pt dx w/ COVID 12/23 and dc home w/ spouse. 1/10 CXR showed ARDS bilateral increasing opacification; respiratory status worsened; concerns for aspiration. Acute PE; loculated right pleural effusion; 1/11 CTS consult, recs chest tube. 1/12 CT chest with densely consolidated right lung, air fluid level/ hydropneumothorax. Thoracentesis 1/12; pleural fluid sampled and concerning for empyema.  1/13 emergently transferred to ICU for worsening hypoxia; CT showed worsening right hydropneumothorax with loculations in pleura. Emergent right chest tube placed. Pt intubated 11/30/19-12/01/19.    PT Comments    Pt continues to have low tolerance of mobility, but willing to get up to the recliner chair. RR increased to upper 40s today with O2 sats dropping to the low 80s taking 5-8 mins to recover sats and reduce RR.  Pt on 5 L O2 [place] during my session and O2 measured by pedi finger probe as well as pedi earlobe probe.  Pt able to participate in one LE exercise, and breathing exercises with rest between to recover his breathing.  PT will continue to follow acutely for safe mobility progression.   Follow Up Recommendations  CIR     Equipment Recommendations  Rolling walker with 5" wheels;3in1 (PT);Other (comment)(home O2 likely)    Recommendations for Other Services   NA     Precautions / Restrictions Precautions Precautions: Fall;Other (comment) Precaution Comments: R sided Chest tube, monitor SpO2    Mobility  Bed Mobility Overal bed  mobility: Needs Assistance Bed Mobility: Rolling;Sidelying to Sit Rolling: Supervision Sidelying to sit: Min assist       General bed mobility comments: HOB elevated with heavy use of rail.  Min hand held assist to pull up to sitting EOB.   Transfers Overall transfer level: Needs assistance Equipment used: 1 person hand held assist Transfers: Sit to/from [employer] Sit to Stand: Min assist Stand pivot transfers: Min assist       General transfer comment: Min assist to stand and pivot to the recliner chair.  Unable to ambulate at this time due to intolerance of mobility.  RR into the upper 40s, O2 sats in the low 80s with increased belly breathing with just sitting EOB.  CT also on continuous suction.  Pt takes 5-8 mins to recover from mobilizing once seated in the recliner chair.          Balance Overall balance assessment: Needs assistance Sitting-balance support: Feet supported;Bilateral upper extremity supported Sitting balance-Leahy Scale: Fair       Standing balance-Leahy Scale: Poor Standing balance comment: needs external assist and did not stand longer than 30 seconds.                             Cognition Arousal/Alertness: Awake/alert Behavior During Therapy: Anxious Overall Cognitive Status: Within Functional Limits for tasks assessed                                 General Comments: mildly anxious during mobility  Exercises General Exercises - Lower Extremity Toe Raises: AROM;Both;10 reps Heel Raises: AROM;Both;10 reps Other Exercises Other Exercises: Incentive spirometer x 10 with min cues on technique. Pulling , Other Exercises: Flutter valve x 10    General Comments General comments (skin integrity, edema, etc.): Pt on 5 L O2 [place] during mobility.  O2 decreased to low 80s, however RR increased to upper 40s and he took 5-8 mins to recover once seated in the recliner chair.  you can audibly hear the  airleak in his chest tube.       Pertinent Vitals/Pain Pain Assessment: Faces Faces Pain Scale: Hurts even more Pain Location: R chest wall Pain Descriptors / Indicators: Grimacing;Guarding Pain Intervention(s): Limited activity within patient's tolerance;Monitored during session;Repositioned           PT Goals (current goals can now be found in the care plan section) Acute Rehab PT Goals Patient Stated Goal: to go home Progress towards PT goals: Progressing toward goals    Frequency    Min 3X/week      PT Plan Current plan remains appropriate       AM-PAC PT "6 Clicks" Mobility   Outcome Measure  Help needed turning from your back to your side while in a flat bed without using bedrails?: A Little Help needed moving from lying on your back to sitting on the side of a flat bed without using bedrails?: A Little Help needed moving to and from a bed to a chair (including a wheelchair)?: A Little Help needed standing up from a chair using your arms (e.g., wheelchair or bedside chair)?: A Little Help needed to walk in hospital room?: A Lot Help needed climbing 3-5 steps with a railing? : Total 6 Click Score: 15    End of Session Equipment Utilized During Treatment: Oxygen Activity Tolerance: Patient limited by fatigue;Patient limited by pain Patient left: in chair;with nursing/sitter in room Nurse Communication: Mobility status PT Visit Diagnosis: Unsteadiness on feet (R26.81);Muscle weakness (generalized) (M62.81)     Time: 3086-5784 PT Time Calculation (min) (ACUTE ONLY): 53 min  Charges:  $Therapeutic Exercise: 8-22 mins $Therapeutic Activity: 38-52 mins                    Corinna Capra, PT, DPT  Acute Rehabilitation [phone] pager #[phone] office  @ [facility]: [phone]   12/04/2019, 3:03 PM

## 2019-12-04 NOTE — Progress Notes (Signed)
Physical Exam Chest:     small palp hematoma above CT site.  Plan Hold therapeutic LMWH today Reassess in am  Hold off on intra pleural TPA and pulmazyme    Simonne Martinet ACNP-BC [facility] Pulmonary/Critical Care Pager # [phone] OR # [phone] if no answer

## 2019-12-04 NOTE — Progress Notes (Signed)
Report called to RN Corey Skains at [facility] 4E.

## 2019-12-04 NOTE — Progress Notes (Signed)
Spoke with pt wife Margaretha Glassing. Updated on pt status. She is aware he maybe transferred at some point through the evening to [facility]. If he is transferred she wishes to be called in the AM, not over night. All nursing questions answered at this time.

## 2019-12-04 NOTE — Progress Notes (Signed)
Wife updated on plan of care   Simonne Martinet ACNP-BC [facility] Pulmonary/Critical Care Pager # [phone] OR # [phone] if no answer

## 2019-12-04 NOTE — Progress Notes (Signed)
ANTICOAGULATION CONSULT NOTE - Follow Up Consult  Pharmacy Consult for Lovenox Indication: PE  No Known Allergies  Patient Measurements: Height: 5\' 5"  (165.1 cm) Weight: 198 lb 3.1 oz (89.9 kg) IBW/kg (Calculated) : 61.5 Heparin Dosing Weight: 85 kg  Vital Signs: Temp: 98.7 F (37.1 C) (01/21 0725) Temp Source: Axillary (01/21 0725) BP: 109/90 (01/21 0753) Pulse Rate: 118 (01/21 0753)  Labs: Recent Labs    12/02/19 0212 12/02/19 0212 12/03/19 0130 12/04/19 0505  HGB 11.4*   < > 11.1* 9.2*  HCT 35.9*  --  33.8* 29.0*  PLT 318  --  302 326  CREATININE 1.15  --  1.09 1.27*   < > = values in this interval not displayed.    Estimated Creatinine Clearance: 70.2 mL/min (A) (by C-G formula based on SCr of 1.27 mg/dL (H)).   Medications:  Scheduled:  . vitamin C  500 mg Oral Daily  . aspirin EC  81 mg Oral Daily  . atorvastatin  10 mg Oral QPM  . chlorhexidine  15 mL Mouth/Throat BID  . Chlorhexidine Gluconate Cloth  6 each Topical Daily  . chlorpheniramine-HYDROcodone  5 mL Oral Q12H  . [START ON 12/05/2019] enoxaparin (LOVENOX) injection  1 mg/kg Subcutaneous Q12H  . feeding supplement (ENSURE ENLIVE)  237 mL Oral TID BM  . feeding supplement (VITAL HIGH PROTEIN)  1,000 mL Per Tube Q24H  . insulin aspart  0-20 Units Subcutaneous TID WC  . insulin detemir  18 Units Subcutaneous Daily  . mouth rinse  15 mL Mouth Rinse BID  . metoprolol tartrate  50 mg Oral BID  . sodium chloride flush  3 mL Intravenous Q12H  . zinc sulfate  220 mg Oral Daily   Infusions:  . sodium chloride Stopped (12/01/19 1456)  .  ceFAZolin (ANCEF) IV 2 g (12/04/19 0524)    Assessment: 54 yo male with COVID pneumonitis started on Eliquis for PE, held for thoracentesis 1/12 and chest tube placement on 1/13.    Currently on treatment dose Lovenox with R chest tube in place. Of note, patient noted to have hematuria today with drop in hemoglobin. Medical team wants to hold Lovenox today and resume  tomorrow.   Goal of Therapy:  Anti-Xa level 0.6-1 units/ml 4hrs after LMWH dose given Monitor platelets by anticoagulation protocol: Yes   Plan:  Hold Lovenox x 24 hours, then resume tomorrow  Follow up renal function, CBC Follow up transition back to long-term anticoagulation.  2/13, PharmD., BCPS Clinical Pharmacist Clinical phone for 12/04/19 until 5pm: [phone]

## 2019-12-04 NOTE — Progress Notes (Signed)
NAME:  Nathaniel Webb, MRN:  [id number], DOB:  03/22/1966, LOS: 3 ADMISSION DATE:  11/11/2019, CONSULTATION DATE:  1/12 REFERRING MD:  Sherral Hammers, CHIEF COMPLAINT:  Pleural effusion   Brief History   54 y/o male admitted with COVID pneumonia on 12/29 (diagnosed on 12/23), developed loculated pleural effusion by 1/10 with associated hydropneumothorax.    Past Medical History  DM II , HTN, Morbid Obesity  Significant Hospital Events   12/29 Admit  1/12 PCCM consulted, thora performed 1/13 Tx to ICU with worsening pneumothorax  1/17 intubated  1/18: Continues to have persistent right-sided pneumothorax in spite of chest tube. New 28 french placed -->extubated later that evening 1/19 small bore ct removed getting lasix. Feeling better and stronger. airleak still brisk 1/20 O2 down to 4 liters but still desaturates. CT chest ordered to eval pleural space/empyema, results: decreased right hydro/ptx. prob bronchopleural fistula RU and RML. Improved overall aeration. Evaluated by CVTS-->felt nothing to do at this point but to cont current rx. As ALI resolved might be VATS candidate eventually.  1/21 no sig change. Did have hematuria (new)->holding LMWH for a day   Consults:  PCCM  Procedures:  1/13 chest tube R side > small bore removed 1/19 1/17 ETT >> 1/18: 28 french chest tube placed  Significant Diagnostic Tests:  CTA Chest 12/29 >> positive for PE with small volume embolus in the distal right pulmonary artery, lobar, and some segmental branches CT ABD/Pelvis 1/10 >> moderate stool burden in colon, R>L airspace disease compatible with PNA, loculated right pleural effusion CT Chest w/o 1/10 >> multiloculated right lung hydropneumothorax, larger volume of pleural fluid relative to pleural air, progression of multilobar right lung consolidation, small volume loculated right pneumomediastinum Thora 1/12 >> glucose <20, LD 2,447, protein 5.2, total nucleated cells 117,299, neutrophil 85 CT  Chest w/o 1/13 >> limited exam by motion, large right hydropneumothorax with numerous thin internal septations with air fluid levels increased in size, complete right lung atelectasis, pneumomediastinum in the anterior right pericardial region, similar extensive patchy GGO on L 1/21 CT chest: IMPRESSION: 1. Decreased small to moderate right hydropneumothorax. 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 2. Improved aeration of the right lung with residual mild to moderate atelectasis. 3. Decreased pneumomediastinum in the anterior right pericardial region. 4. Extensive patchy ground-glass density and reticulation throughout the left and right lungs, improved from prior CT, remains consistent with COVID-19 pneumonia. 5.  Aortic atherosclerosis Micro Data:  BCID 1/10 >> MSSA BCx2 1/10 >> MSSA  Pleural fluid culture 1/12 >> MSSA  Antimicrobials/COVID Rx  Remdesivir 12/27 > 1/1 Tocilizumab 12/28 >  Cefepime 1/10 >>1/14 Linezolid 1/10 >> 1/14 Cefazolin 1/14 >>  Interim history/subjective:  Reports pain at CT site w/ cough and discomfort in general w/ cough otherwise no new changes other than hematuria (new) Objective   Blood pressure 109/90, pulse (Abnormal) 113, temperature 98.7 F (37.1 C), temperature source Axillary, resp. rate (Abnormal) 39, height 5\' 5"  (1.651 m), weight 89.9 kg, SpO2 94 %.        Intake/Output Summary (Last 24 hours) at 12/04/2019 0752 Last data filed at 12/04/2019 0725 Gross per 24 hour  Intake 2483 ml  Output 2600 ml  Net -117 ml   Filed Weights   11/30/19 0500 12/01/19 0431 12/02/19 0500  Weight: 95.1 kg 92.9 kg 89.9 kg    Examination: General this is a pleasant 54 year old. Sitting up in chair. No distress  HENT NCAT no JVD  pulm course rhonchi. Brisk 7/7 airleak persists. Does have bloody pleural drainage. No accessory use.  Oxygen 3 lpm Card RRR  abd not tender  Ext no sig edema  gu + hematuria in foley    Neuro intact  Resolved Hospital Problem list    Assessment & Plan:    Acute Hypoxic respiratory failure 2/2 Covid pneumonia c/b MSSA PNA, empyema and bronchopleural fistula Pcxr: small apical ptx persists. Loculated pleural disease on left unchanged. Aeration on left improved.  Plan Wean oxygen ->pulse ox  IS Assess daily for diuretics CT to 30 cmH20 sxn Daily cxr Pain control Will discuss pleural TPA and DNase..Contraindicated w/ BPF?? Repeat CT and re-assess 2-3 weeks re: possible consideration for VATS  Hematuria  Plan Holding LMWH on 1/21  Small Acute PE Plan LMWH (resume 1/22)  MSSA Bacteremia ->blood cultures from repeat on 1/12 negative  Plan Cont ancef (4 weeks total w/ start date 1/13)  Intermittent fluid electrolyte imbalance & mild AKI w/ hyponatremia 2/2 diuretics.  Plan Monitor daily. Backing off on diuretics today, let him equilibrate and reassess 1/22    Hyperglycemia Plan ssi and levemir; will incr to 18 as cbgs trending up   Best practice:  DVT: LMWH therapeutic dosing  Diet: reg  protonix Full code Wife updated daily  Can prob go to [facility] soon as may need surgery in future   Simonne Martinet ACNP-BC [facility] Pulmonary/Critical Care Pager # [phone] OR # [phone] if no answer

## 2019-12-05 ENCOUNTER — Inpatient Hospital Stay (HOSPITAL_COMMUNITY): Payer: Medicaid Other

## 2019-12-05 DIAGNOSIS — Z978 Presence of other specified devices: Secondary | ICD-10-CM

## 2019-12-05 DIAGNOSIS — D72829 Elevated white blood cell count, unspecified: Secondary | ICD-10-CM

## 2019-12-05 DIAGNOSIS — T85638A Leakage of other specified internal prosthetic devices, implants and grafts, initial encounter: Secondary | ICD-10-CM

## 2019-12-05 LAB — CBC
HCT: 25.7 % — ABNORMAL LOW (ref 39.0–52.0)
Hemoglobin: 8.4 g/dL — ABNORMAL LOW (ref 13.0–17.0)
MCH: 27 pg (ref 26.0–34.0)
MCHC: 32.7 g/dL (ref 30.0–36.0)
MCV: 82.6 fL (ref 80.0–100.0)
Platelets: 272 10*3/uL (ref 150–400)
RBC: 3.11 MIL/uL — ABNORMAL LOW (ref 4.22–5.81)
RDW: 16.3 % — ABNORMAL HIGH (ref 11.5–15.5)
WBC: 43 10*3/uL — ABNORMAL HIGH (ref 4.0–10.5)
nRBC: 1.9 % — ABNORMAL HIGH (ref 0.0–0.2)

## 2019-12-05 LAB — BASIC METABOLIC PANEL
Anion gap: 20 — ABNORMAL HIGH (ref 5–15)
BUN: 52 mg/dL — ABNORMAL HIGH (ref 6–20)
CO2: 22 mmol/L (ref 22–32)
Calcium: 7.4 mg/dL — ABNORMAL LOW (ref 8.9–10.3)
Chloride: 89 mmol/L — ABNORMAL LOW (ref 98–111)
Creatinine, Ser: 2.69 mg/dL — ABNORMAL HIGH (ref 0.61–1.24)
GFR calc Af Amer: 30 mL/min — ABNORMAL LOW (ref >60)
GFR calc non Af Amer: 26 mL/min — ABNORMAL LOW (ref >60)
Glucose, Bld: 201 mg/dL — ABNORMAL HIGH (ref 70–99)
Potassium: 4.5 mmol/L (ref 3.5–5.1)
Sodium: 131 mmol/L — ABNORMAL LOW (ref 135–145)

## 2019-12-05 LAB — SODIUM, URINE, RANDOM: Sodium, Ur: <10 mmol/L

## 2019-12-05 LAB — GLUCOSE, CAPILLARY
Glucose-Capillary: 159 mg/dL — ABNORMAL HIGH (ref 70–99)
Glucose-Capillary: 192 mg/dL — ABNORMAL HIGH (ref 70–99)
Glucose-Capillary: 209 mg/dL — ABNORMAL HIGH (ref 70–99)
Glucose-Capillary: 148 mg/dL — ABNORMAL HIGH (ref 70–99)

## 2019-12-05 LAB — OSMOLALITY: Osmolality: 297 mOsm/kg — ABNORMAL HIGH (ref 275–295)

## 2019-12-05 LAB — BRAIN NATRIURETIC PEPTIDE: B Natriuretic Peptide: 202.8 pg/mL — ABNORMAL HIGH (ref 0.0–100.0)

## 2019-12-05 LAB — OSMOLALITY, URINE: Osmolality, Ur: 318 mOsm/kg (ref 300–900)

## 2019-12-05 LAB — MRSA PCR SCREENING: MRSA by PCR: NEGATIVE

## 2019-12-05 MED ORDER — MELATONIN 3 MG PO TABS
3.0000 mg | ORAL_TABLET | Freq: Every day | ORAL | Status: DC
  Administered 2019-12-05 – 2019-12-17 (×13): 3 mg via ORAL
  Filled 2019-12-05 (×14): qty 1

## 2019-12-05 MED ORDER — ALBUTEROL SULFATE (2.5 MG/3ML) 0.083% IN NEBU
3.0000 mL | INHALATION_SOLUTION | RESPIRATORY_TRACT | Status: DC | PRN
  Administered 2019-12-05 – 2019-12-09 (×3): 3 mL via RESPIRATORY_TRACT
  Filled 2019-12-05 (×3): qty 3

## 2019-12-05 MED ORDER — POLYVINYL ALCOHOL 1.4 % OP SOLN
1.0000 [drp] | OPHTHALMIC | Status: DC | PRN
  Administered 2019-12-05 – 2019-12-17 (×5): 1 [drp] via OPHTHALMIC
  Filled 2019-12-05: qty 15

## 2019-12-05 NOTE — Progress Notes (Signed)
Physical Therapy Treatment Patient Details Name: Nathaniel Webb MRN: [id number] DOB: 12-18-1965 Today's Date: 12/05/2019    History of Present Illness 54 y/o male w/ hx of Obesity, low HDL, HTN, DM, presented to [facility] (12/29) with approximately 1 week history of gradually worsening shortness of breath.  He was found to have acute hypoxic respiratory failure and was requiring high flow oxygen to maintain O2 saturations.  Chest x-ray was typical of Covid pneumonitis. Pt dx w/ COVID 12/23 and dc home w/ spouse. 1/10 CXR showed ARDS bilateral increasing opacification; respiratory status worsened; concerns for aspiration. Acute PE; loculated right pleural effusion; 1/11 CTS consult, recs chest tube. 1/12 CT chest with densely consolidated right lung, air fluid level/ hydropneumothorax. Thoracentesis 1/12; pleural fluid sampled and concerning for empyema.  1/13 emergently transferred to ICU for worsening hypoxia; CT showed worsening right hydropneumothorax with loculations in pleura. Emergent right chest tube placed. Pt intubated 11/30/19-12/01/19. Pt developed hematuria on 1/21.    PT Comments    Pt limited by fatigue during session, reporting fatigue prior to initiation of PT. Pt with significant elevation of RR up to 50 at times during session with some desat noted. Pt with anxiety during mobility and demonstrates improved sats and RR with verbal cues for pursed lip breathing and to slow RR. Pt reports SOB more significant today than at [place], and declines attempts at [place] mobility due to respiratory status. Pt tolerates bed in chair mode well and and demonstrates good exercise form. Pt will benefit from continued acute PT POC to improve activity tolerance and reduce caregiver burden.   Follow Up Recommendations  CIR(will need improvement in activity tolerance)     Equipment Recommendations  Rolling walker with 5" wheels;3in1 (PT);Other (comment)(home O2 likely)    Recommendations for Other  Services       Precautions / Restrictions Precautions Precautions: Fall;Other (comment)(R chest tube) Precaution Comments: R sided Chest tube, monitor SpO2 Restrictions Weight Bearing Restrictions: No    Mobility  Bed Mobility Overal bed mobility: Needs Assistance Bed Mobility: Supine to Sit;Sit to Supine     Supine to sit: Mod assist;HOB elevated Sit to supine: Mod assist;HOB elevated   General bed mobility comments: pt requiring increased time, and significant elevation of HOB, reporting significant fatigue prior to initiation of therapy  Transfers Overall transfer level: (deferred 2/2 elevated RR and pt reports of SOB)                  Ambulation/Gait                 Stairs             Wheelchair Mobility    Modified Rankin (Stroke Patients Only)       Balance Overall balance assessment: Needs assistance Sitting-balance support: Bilateral upper extremity supported;Feet unsupported Sitting balance-Leahy Scale: Fair Sitting balance - Comments: minG with BUE support of bed Postural control: Right lateral lean(leaning against head of bed at times)                                  Cognition Arousal/Alertness: Awake/alert Behavior During Therapy: Anxious Overall Cognitive Status: Within Functional Limits for tasks assessed                                 General Comments: mildly anxious during mobility  Exercises General Exercises - Lower Extremity Ankle Circles/Pumps: AROM;Both;20 reps Gluteal Sets: AROM;Both;5 reps Straight Leg Raises: AROM;Both;5 reps    General Comments General comments (skin integrity, edema, etc.): pt on 6L [place] during session. Pt RR up to 40 at rest. RR increases to 50 with bed mobility, desaturating twice to 80% but improves with PT cues to take slow and deep breaths. Pt toleratign bed in chair mode well at end of session with stable sats      Pertinent Vitals/Pain Pain Assessment:  Faces Faces Pain Scale: Hurts even more Pain Location: R chest wall Pain Descriptors / Indicators: Grimacing;Guarding Pain Intervention(s): Limited activity within patient's tolerance    Home Living                      Prior Function            PT Goals (current goals can now be found in the care plan section) Acute Rehab PT Goals Patient Stated Goal: to go home Progress towards PT goals: Not progressing toward goals - comment(limited by fatigue)    Frequency    Min 3X/week      PT Plan Current plan remains appropriate    Co-evaluation              AM-PAC PT "6 Clicks" Mobility   Outcome Measure  Help needed turning from your back to your side while in a flat bed without using bedrails?: A Little Help needed moving from lying on your back to sitting on the side of a flat bed without using bedrails?: A Lot Help needed moving to and from a bed to a chair (including a wheelchair)?: A Lot Help needed standing up from a chair using your arms (e.g., wheelchair or bedside chair)?: A Lot Help needed to walk in hospital room?: A Lot Help needed climbing 3-5 steps with a railing? : Total 6 Click Score: 12    End of Session Equipment Utilized During Treatment: Oxygen Activity Tolerance: Patient limited by fatigue Patient left: in bed;with call bell/phone within reach Nurse Communication: Mobility status PT Visit Diagnosis: Unsteadiness on feet (R26.81);Muscle weakness (generalized) (M62.81)     Time: 5631-4970 PT Time Calculation (min) (ACUTE ONLY): 25 min  Charges:  $Therapeutic Activity: 23-37 mins                     Arlyss Gandy, PT, DPT Acute Rehabilitation Pager: [phone]    Arlyss Gandy 12/05/2019, 12:33 PM

## 2019-12-05 NOTE — Progress Notes (Signed)
PULMONARY / CRITICAL CARE MEDICINE   NAME:  Nathaniel Webb, MRN:  [id number], DOB:  01-02-1966, LOS: 24 ADMISSION DATE:  11/11/2019, CONSULTATION DATE:  1/12 REFERRING MD:  Joseph Art, CHIEF COMPLAINT:  Pleural effusion   BRIEF HISTORY:    Nathaniel Webb is a 54 y/o male, with a PMH appreciated below, admitted with COVID 19 pneumonia. Subsequently developed a loculated pleural effusion by 1/10 with associated hydropneumothorax.  SIGNIFICANT PAST MEDICAL HISTORY    Past Medical History:  Diagnosis Date  . Diabetes mellitus without complication (HCC)   . Heel pain 06/09/2015  . High triglycerides 05/09/2016  . Hypertension   . Low HDL (under 40) 05/09/2016  . Obesity, Class II, BMI 35-39.9, with comorbidity (HCC) 01/04/2016     SIGNIFICANT EVENTS:  12/29: Admitted 1/12: PCCM consulted with performance of a thoracentesis 1/13 Tx to ICU with worsening pneumothorax 1/17 intubated 1/18 Continue to have R sided pneumothorax despite chest tube. 28 french placed and extubated that evening 1/19 small bore ct removed, getting lasix. Feeling better and stronger. airleak still brisk 1/20 O2 down to 4L, still desaturating. 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 Improved overal aeration. CVTS> Continue current Rx. ALI Resolved might be VATS Candidate eventually.  1/21 hematuria>Holding LMWH with reassessment 1/22 1/22: PT ordered, patient encouraged ambulation. Start trending CBC again to follow leukocytosis. Possible ct repositioning pending CBC results.   STUDIES:   CTA Chest 12/29 >> positive for PE with small volume embolus in the distal right pulmonary artery, lobar, and some segmental branches CT ABD/Pelvis 1/10 >> moderate stool burden in colon, R>L airspace disease compatible with PNA, loculated right pleural effusion CT Chest w/o 1/10 >> multiloculated right lung hydropneumothorax, larger volume of pleural fluid relative to pleural  air, progression of multilobar right lung consolidation, small volume loculated right pneumomediastinum Thora1/12>> glucose <20, LD 2,447, protein 5.2, total nucleated cells 117,299, neutrophil 85 CT Chest w/o 1/13 >> limited exam by motion, large right hydropneumothorax with numerous thin internal septations with air fluid levels increased in size, complete right lung atelectasis, pneumomediastinum in the anterior right pericardial region, similar extensive patchy GGO on L 1/21 CT chest: IMPRESSION: 1. Decreased small to moderate right hydropneumothorax. 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 2. Improved aeration of the right lung with residual mild to moderate atelectasis. 3. Decreased pneumomediastinum in the anterior right pericardial region. 4. Extensive patchy ground-glass density and reticulation throughout the left and right lungs, improved from prior CT, remains consistent with COVID-19 pneumonia. 5. Aortic atherosclerosis CULTURES:  1/10 Blood Culture: Staph Aureus. R Erythromycin, Clindamycin,  1/12 Body Pleural Fluid: Staph Aureus. R Erythromcyin, Clindamycin ANTIBIOTICS:  Remdesivir 12/27>1/1 Tocilizumab: 12/28> Cefepime: 1/10>>1/14 Linezolid 1/10>>1/14 Cefezolin 1/14>>  LINES/TUBES:  12/27 R Antecubital 1/18 Catheter  1/19 R Chest Tube 28 French   CONSULTANTS:  PCCM Cardiothoracic Surgery PMR SUBJECTIVE:  Patient with no complaints overnight. We spoke about his diagnosis and the importance of rest, eating, and ambulation. Patient was agreeable to the plan.   CONSTITUTIONAL: BP (!) 94/49   Pulse 100   Temp 97.7 F (36.5 C) (Oral)   Resp (!) 34   Ht 5\' 5"  (1.651 m)   Wt 91.7 kg   SpO2 100%   BMI 33.64 kg/m   I/O last 3 completed shifts: In: 2050 [P.O.:1320; Other:30; IV Piggyback:700] Out: 1660 [Urine:1250; Chest Tube:410]        PHYSICAL EXAM: General:  Laying  in bed, no acute distress Neuro:  Able to  follow commands, AAOx3,  HEENT:  Normocephalic, atraumatic, poor dentition, no scleral icterus appreciated, on [place].  Cardiovascular:  RRR, no murmurs, rubs, or gallops Lungs:  Audible wheeze appreciated with and without stethoscope in the RU apex, no rales or rhochi appreciated.  Abdomen:  Distended, BS+, non tender Musculoskeletal:  No pitting edema bilaterally.  Skin:  Warm and dry  RESOLVED PROBLEM LIST   ASSESSMENT AND PLAN    MSSA Bacteremia/Empyema with Broncho Pulmonary Fistula:  - Continue Chest tube Suction at this time - Continue Cefazolin  - Cardiothoracic Surgery Consulted, we appreciate their recommendations - CTS recommends postponement of VATS due to impaired oxygenation provided by L lung while operating on R.  - Thrombolytic therapy held due to site hematoma  Hematuria:  Noticed 1/21 with Lovenox held.  - Reassess today   AKI with Associated Hyponatremia:  - Acute Kidney Injury with associated electrolyte imbalances.  - Na: 131  - Cr: 2.69<1.27 - Limited urine output O/N - Continue to Monitor BMP and Correct electrolyte abnormalities. - Continue to Monitor I/O I: 1.3L  O: 825 with Urine   - Likely 2/2 to diuretic use. Patient on Dysphagia 3 diet, will allow for food/fluid intake and reassess Na.    Small Acute Pulmonary Embolism:  - 12/29 PE appreciated in the Right distal pulmonary artery  - Lovenox held due to hematuria on 1/21 - Will reassess hematuria, and restart if resolution of symptoms.   Hypertension:  -Continue Medications  Diabetes Mellitis:  - Continue SSI and Levemir 18U  COVID 19 PenumoniaPneumonitis:  - Completed course of remdesevir and out of the 21 day window   SUMMARY OF TODAY'S PLAN:  Patient is stable with Chest tube placement. We will transfer patient to 4E, and continue to monitor his vitals, while managing his chest tube. Will trend CBC and make changes based on the results.   Best Practice / Goals of Care / Disposition.    DVT PROPHYLAXIS: Lovenox SUP:NA NUTRITION:Dysphagia 3  MOBILITY: Bed rest GOALS OF CARE:FULL FAMILY DISCUSSIONS: Updated wife on cardiothoracic surgery's recommendations for VATS procedure.  DISPOSITION ICU  LABS  Glucose Recent Labs  Lab 12/03/19 1931 12/04/19 0719 12/04/19 1134 12/04/19 1614 12/04/19 2236 12/05/19 0740  GLUCAP 192* 152* 107* 227* 180* 159*    BMET Recent Labs  Lab 12/03/19 0130 12/04/19 0505 12/05/19 0230  NA 138 133* 131*  K 4.3 4.0 4.5  CL 98 90* 89*  CO2 29 29 22   BUN 28* 32* 52*  CREATININE 1.09 1.27* 2.69*  GLUCOSE 116* 130* 201*    Liver Enzymes Recent Labs  Lab 11/30/19 0442 12/02/19 0212 12/03/19 0130  AST 89* 133* 73*  ALT 101* 166* 133*  ALKPHOS 183* 170* 159*  BILITOT 0.3 0.4 0.5  ALBUMIN 2.2* 2.1* 2.1*    Electrolytes Recent Labs  Lab 12/01/19 0400 12/02/19 0212 12/03/19 0130 12/04/19 0505 12/05/19 0230  CALCIUM 7.9*   < > 8.1* 7.9* 7.4*  MG 2.6*  --   --   --   --   PHOS 3.1  --   --   --   --    < > = values in this interval not displayed.    CBC Recent Labs  Lab 12/03/19 0130 12/04/19 0505 12/05/19 1007  WBC 26.8* 29.5* 43.0*  HGB 11.1* 9.2* 8.4*  HCT 33.8* 29.0* 25.7*  PLT 302 326 272    ABG Recent Labs  Lab  11/29/19 2337 11/30/19 0111 11/30/19 0551  PHART 7.513* 7.375 7.446  PCO2ART 37.0 51.3* 44.6  PO2ART 34.0* 94.0 100.0    Coag's No results for input(s): APTT, INR in the last 168 hours.  Sepsis Markers No results for input(s): LATICACIDVEN, PROCALCITON, O2SATVEN in the last 168 hours.  Cardiac Enzymes No results for input(s): TROPONINI, PROBNP in the last 168 hours.  PAST MEDICAL HISTORY :   He  has a past medical history of Diabetes mellitus without complication ([place]), Heel pain (06/09/2015), High triglycerides (05/09/2016), Hypertension, Low HDL (under 40) (05/09/2016), and Obesity, Class II, BMI 35-39.9, with comorbidity ([place]) (01/04/2016).  PAST SURGICAL HISTORY:  He  has no  past surgical history on file.  No Known Allergies  No current facility-administered medications on file prior to encounter.   Current Outpatient Medications on File Prior to Encounter  Medication Sig  . acetaminophen (TYLENOL) 325 MG tablet Take 2 tablets (650 mg total) by mouth every 6 (six) hours as needed for mild pain or headache (fever >/= 101).  Marland Kitchen aspirin EC 81 MG tablet Take 81 mg by mouth daily.  Marland Kitchen atorvastatin (LIPITOR) 10 MG tablet Take 10 mg by mouth every evening.  Marland Kitchen lisinopril (ZESTRIL) 20 MG tablet Take 20 mg by mouth daily.  . metFORMIN (GLUCOPHAGE) 500 MG tablet Take 500 mg by mouth daily with breakfast.  . Omega-3 Fatty Acids (FISH OIL) 1000 MG CAPS Take 1,000 mg by mouth daily.  . insulin aspart (NOVOLOG) 100 UNIT/ML injection Inject 0-9 Units into the skin every 4 (four) hours.    FAMILY HISTORY:   His family history includes COPD in his mother; Diabetes in his father.  SOCIAL HISTORY:  He  reports that he has quit smoking. He has never used smokeless tobacco. He reports that he does not drink alcohol or use drugs.  REVIEW OF SYSTEMS:

## 2019-12-05 NOTE — Progress Notes (Signed)
Pt transported to [facility] 2H room 23 via [facility]. Report called to RN [place]. Pt transported on 4L [place], with foley and Chest Tube in place. IV placed on saline lock. Pt alert and oriented when leaving. HR 103, RR 38, O2 97% on 4L . All belongings at bedside sent with pt including a bible, inhaler, and IS. Pt wife wished to be notified in AM, told [place] to please have RN call in morning to let her know that pt was transferred.

## 2019-12-05 NOTE — Progress Notes (Addendum)
[facility]ious [facility]  Date of Admission:  11/11/2019     Total days of antibiotics 13 Day 9 of Cefazolin          ASSESSMENT:  Mr. Binstock has remained afebrile with continued leukocytosis in the setting of MSSA bacteremia, and empyema with bronchopleural fistula. Chest x-ray with unchanged hydropneumothroax and right chest wall subcutaneous emphysema. There is an air leak in his current chest tube. Course has been complicated by development of acute PE. Currently on Day 9 of Cefazolin and tolerating well. Plan remain to continue Cefazolin through 12/24/19. CVTS also following.   PLAN:  1. Continue current dose of Cefazolin through 12/24/19 2. PE and chest tube management per critical care/primary team.  Principal Problem:   Acute respiratory failure with hypoxemia (HCC) Active Problems:   Type 2 diabetes, controlled, with peripheral neuropathy ([place])   Hypertension   Morbid obesity ([place])   Pneumonia due to COVID-19 virus   Diabetes mellitus type 2, uncontrolled, with complications ([place])   Acute pulmonary embolus ([place])   Demand ischemia (HCC)   Constipation   Loculated pleural effusion   MSSA bacteremia   Empyema lung (HCC)   Hydropneumothorax   ARDS (adult respiratory distress syndrome) (HCC)   Pneumonia of right lung due to methicillin susceptible Staphylococcus aureus (MSSA) ([place])   Chest tube in place   . vitamin C  500 mg Oral Daily  . aspirin EC  81 mg Oral Daily  . atorvastatin  10 mg Oral QPM  . chlorhexidine  15 mL Mouth/Throat BID  . Chlorhexidine Gluconate Cloth  6 each Topical Daily  . chlorpheniramine-HYDROcodone  5 mL Oral Q12H  . enoxaparin (LOVENOX) injection  1 mg/kg Subcutaneous Q12H  . feeding supplement (ENSURE ENLIVE)  237 mL Oral TID BM  . insulin aspart  0-20 Units Subcutaneous TID WC  . insulin detemir  18 Units Subcutaneous Daily  . mouth rinse  15 mL Mouth Rinse BID  . metoprolol tartrate  50 mg Oral BID  . senna-docusate   3 tablet Oral BID  . sodium chloride flush  3 mL Intravenous Q12H  . zinc sulfate  220 mg Oral Daily    SUBJECTIVE:  Mr. Arrighi has been afebrile with continued leukocytosis. Transferred to [facility] overnight for CVTS evaluation. Currently on Day 9 of Cefazolin.   No Known Allergies   Review of Systems: Review of Systems  Constitutional: Negative for chills, fever and weight loss.  Respiratory: Positive for cough, sputum production and wheezing. Negative for shortness of breath.   Cardiovascular: Negative for chest pain and leg swelling.  Gastrointestinal: Negative for abdominal pain, constipation, diarrhea, nausea and vomiting.  Skin: Negative for rash.      OBJECTIVE: Vitals:   12/05/19 0720 12/05/19 0730 12/05/19 0749 12/05/19 0926  BP:  (!) 84/49  92/62  Pulse: (!) 45   (!) 102  Resp: (!) 37 (!) 38    Temp:   97.7 F (36.5 C)   TempSrc:   Oral   SpO2: 100%     Weight:      Height:       Body mass index is 33.64 kg/m.  Physical Exam Constitutional:      General: He is not in acute distress.    Appearance: He is well-developed.  Cardiovascular:     Rate and Rhythm: Normal rate and regular rhythm.     Heart sounds: Normal heart sounds.     Comments: Chest tube in place  with air leak present.  Pulmonary:     Effort: Pulmonary effort is normal.     Breath sounds: Wheezing present.  Abdominal:     General: Bowel sounds are normal.  Skin:    General: Skin is warm and dry.  Neurological:     Mental Status: He is alert and oriented to person, place, and time.     Lab Results Lab Results  Component Value Date   WBC 29.5 (H) 12/04/2019   HGB 9.2 (L) 12/04/2019   HCT 29.0 (L) 12/04/2019   MCV 82.2 12/04/2019   PLT 326 12/04/2019    Lab Results  Component Value Date   CREATININE 2.69 (H) 12/05/2019   BUN 52 (H) 12/05/2019   NA 131 (L) 12/05/2019   K 4.5 12/05/2019   CL 89 (L) 12/05/2019   CO2 22 12/05/2019    Lab Results  Component Value Date    ALT 133 (H) 12/03/2019   AST 73 (H) 12/03/2019   ALKPHOS 159 (H) 12/03/2019   BILITOT 0.5 12/03/2019     Microbiology: Recent Results (from the past 240 hour(s))  Body fluid culture     Status: None   Collection Time: 11/25/19 11:33 AM   Specimen: Pleura; Body Fluid  Result Value Ref Range Status   Specimen Description   Final    PLEURAL RT Performed at [facility], [address] [place], [place] [address]    Special Requests   Final    NONE Performed at [facility], [address], [place], [place] [address]    Gram Stain   Final    MODERATE WBC PRESENT, PREDOMINANTLY PMN RARE GRAM POSITIVE COCCI IN PAIRS Performed at [facility], [address], [place], [place] [address]    Culture MODERATE STAPHYLOCOCCUS AUREUS  Final   Report Status 11/28/2019 FINAL  Final   Organism ID, Bacteria STAPHYLOCOCCUS AUREUS  Final      Susceptibility   Staphylococcus aureus - MIC*    CIPROFLOXACIN <=0.5 SENSITIVE Sensitive     ERYTHROMYCIN >=8 RESISTANT Resistant     GENTAMICIN <=0.5 SENSITIVE Sensitive     OXACILLIN <=0.25 SENSITIVE Sensitive     TETRACYCLINE <=1 SENSITIVE Sensitive     VANCOMYCIN 1 SENSITIVE Sensitive     TRIMETH/SULFA <=10 SENSITIVE Sensitive     CLINDAMYCIN RESISTANT Resistant     RIFAMPIN <=0.5 SENSITIVE Sensitive     Inducible Clindamycin POSITIVE Resistant     * MODERATE STAPHYLOCOCCUS AUREUS  Culture, blood (routine x 2)     Status: None   Collection Time: 11/25/19  7:05 PM   Specimen: BLOOD  Result Value Ref Range Status   Specimen Description   Final    BLOOD Performed at [facility], [address] [place], [place] [address]    Special Requests   Final    Normal Performed at [facility], [address], [place], [place] [address]    Culture   Final    NO GROWTH 5 DAYS Performed at [facility], [address] [place], [place] [address]    Report Status  12/01/2019 FINAL  Final     [name], NP [facility] [phone] Pager  12/05/2019  9:47 AM

## 2019-12-05 NOTE — Progress Notes (Signed)
Nutrition Follow up  DOCUMENTATION CODES:   Obesity unspecified  INTERVENTION:   Pt without BM x 5 days    Continue Ensure Enlive po TID, each supplement provides 350 kcal and 20 grams of protein  Add Magic cup TID with meals, each supplement provides 290 kcal and 9 grams of protein  MVI daily   NUTRITION DIAGNOSIS:   Inadequate oral intake related to acute illness, catabolic illness, decreased appetite as evidenced by NPO status, meal completion < 50%.  Ongoing  GOAL:   Patient will meet greater than or equal to 90% of their needs   Progressing slightly   MONITOR:   PO intake, Supplement acceptance, Labs, Weight trends, Diet advancement  REASON FOR ASSESSMENT:   Consult, Ventilator Enteral/tube feeding initiation and management  ASSESSMENT:   55 yo male admitted with COVID 19 pneumonia, developed worsening pneumothorax requiring chest tube, brief intubation.   PMH includes DM, HTN, morbid obesity  12/29 Admit 01/13 Worsening pneumothorax, chest tube placed 01/17 Intubated 01/18 Extubated  Pt working with PT at time of RD visit. Appetite is improving slowly. Ate 50% of breakfast and finished one Ensure. Meal completions charted as 0-75% for his last eight meals (33% average). Finishing 2-3 Ensures daily. RD to add magic cup. If meal intake does not progress over the weekend, recommend Cortrak placement with initiation of EN on Tuesday (next service date).   Admission weight: 106.1 kg  Current weight: 91.7 kg   I/O: -3,713 ml since 1/8  UOP: 575 ml x 24 hrs  Chest tubes: 250 ml x 24 hrs   Medications: 500 mg Vit C, SS novolog, levemir, senokot, 220 mg zinc sulfate  Labs: Na 131 (L) Cr 2.69-trending up CBG 159-192  Diet Order:   Diet Order            DIET DYS 3 Room service appropriate? Yes; Fluid consistency: Thin  Diet effective now              EDUCATION NEEDS:   Not appropriate for education at this time  Skin:  Skin Assessment: Reviewed RN  Assessment  Last BM:  1/17  Height:   Ht Readings from Last 1 Encounters:  11/11/19 5\' 5"  (1.651 m)    Weight:   Wt Readings from Last 1 Encounters:  12/05/19 91.7 kg    Ideal Body Weight:  61.8 kg  BMI:  Body mass index is 33.64 kg/m.  Estimated Nutritional Needs:   Kcal:  2325-2780 kcals  Protein:  135-160 g  Fluid:  >/= 2.2 L  03-29-2006 RD, LDN Clinical Nutrition Pager # [phone]

## 2019-12-05 NOTE — Progress Notes (Signed)
Patient received from [facility] to 4E24. Vital signs taken. On monitor CCMD notified. Oriented to room and call light.  Ginette Otto, RN

## 2019-12-05 NOTE — Progress Notes (Signed)
eLink Physician-Brief Progress Note Patient Name: Nathaniel Webb DOB: 08/19/1966 MRN: [id number]   Date of Service  12/05/2019  HPI/Events of Note  Nurse wants to know if it is OK to give Metoprolol. BP = 123/60 and HR = 101.   eICU Interventions  Will order: 1. OK to give ordered Metoprolol.  2. Will put hold parameters on Metoprolol - hold dose for SBP < 100 or HR < 60.      Intervention Category Major Interventions: Other:  Lenell Antu 12/05/2019, 10:48 PM

## 2019-12-05 NOTE — Progress Notes (Signed)
  Subjective: Patient transferred to [facility] WBC up  And chest tube drainage decreasing Would hold high dose lovenox and ask IR to place CT guided Pigtail to improve drainage of loculated R pleural effusion because he does not meet criteria for VATS with bilateral pneumonia  Objective: Vital signs in last 24 hours: Temp:  [97.7 F (36.5 C)-98.6 F (37 C)] (P) 97.8 F (36.6 C) (01/22 1535) Pulse Rate:  [45-110] 104 (01/22 1535) Cardiac Rhythm: (P) Sinus tachycardia (01/22 1535) Resp:  [20-41] 30 (01/22 1535) BP: (73-115)/(37-83) 115/66 (01/22 1535) SpO2:  [86 %-100 %] 99 % (01/22 1535) Weight:  [91.7 kg] 91.7 kg (01/22 0510)  Hemodynamic parameters for last 24 hours:    Intake/Output from previous day: 01/21 0701 - 01/22 0700 In: 1350 [P.O.:720; IV Piggyback:600] Out: 825 [Urine:575; Chest Tube:250] Intake/Output this shift: Total I/O In: 500 [P.O.:300; IV Piggyback:200] Out: 350 [Urine:300; Chest Tube:50]    Lab Results: Recent Labs    12/04/19 0505 12/05/19 1007  WBC 29.5* 43.0*  HGB 9.2* 8.4*  HCT 29.0* 25.7*  PLT 326 272   BMET:  Recent Labs    12/04/19 0505 12/05/19 0230  NA 133* 131*  K 4.0 4.5  CL 90* 89*  CO2 29 22  GLUCOSE 130* 201*  BUN 32* 52*  CREATININE 1.27* 2.69*  CALCIUM 7.9* 7.4*    PT/INR: No results for input(s): LABPROT, INR in the last 72 hours. ABG    Component Value Date/Time   PHART 7.446 11/30/2019 0551   HCO3 30.8 (H) 11/30/2019 0551   TCO2 32 11/30/2019 0551   ACIDBASEDEF 4.1 (H) 11/09/2019 2210   O2SAT 98.0 11/30/2019 0551   CBG (last 3)  Recent Labs    12/04/19 2236 12/05/19 0740 12/05/19 1210  GLUCAP 180* 159* 192*    Assessment/Plan: S/P  Bilateral covid pneumonitis with MSSA RLL pneumonia and loculated effusion   LOS: 24 days    Nathaniel Webb 12/05/2019

## 2019-12-06 ENCOUNTER — Inpatient Hospital Stay (HOSPITAL_COMMUNITY): Payer: Medicaid Other

## 2019-12-06 LAB — BASIC METABOLIC PANEL
Anion gap: 18 — ABNORMAL HIGH (ref 5–15)
Anion gap: 16 — ABNORMAL HIGH (ref 5–15)
BUN: 69 mg/dL — ABNORMAL HIGH (ref 6–20)
BUN: 67 mg/dL — ABNORMAL HIGH (ref 6–20)
CO2: 23 mmol/L (ref 22–32)
CO2: 25 mmol/L (ref 22–32)
Calcium: 7.3 mg/dL — ABNORMAL LOW (ref 8.9–10.3)
Calcium: 7.3 mg/dL — ABNORMAL LOW (ref 8.9–10.3)
Chloride: 89 mmol/L — ABNORMAL LOW (ref 98–111)
Chloride: 89 mmol/L — ABNORMAL LOW (ref 98–111)
Creatinine, Ser: 3.57 mg/dL — ABNORMAL HIGH (ref 0.61–1.24)
Creatinine, Ser: 3.11 mg/dL — ABNORMAL HIGH (ref 0.61–1.24)
GFR calc Af Amer: 21 mL/min — ABNORMAL LOW (ref >60)
GFR calc Af Amer: 25 mL/min — ABNORMAL LOW (ref >60)
GFR calc non Af Amer: 18 mL/min — ABNORMAL LOW (ref >60)
GFR calc non Af Amer: 22 mL/min — ABNORMAL LOW (ref >60)
Glucose, Bld: 139 mg/dL — ABNORMAL HIGH (ref 70–99)
Glucose, Bld: 146 mg/dL — ABNORMAL HIGH (ref 70–99)
Potassium: 4.5 mmol/L (ref 3.5–5.1)
Potassium: 3.8 mmol/L (ref 3.5–5.1)
Sodium: 130 mmol/L — ABNORMAL LOW (ref 135–145)
Sodium: 130 mmol/L — ABNORMAL LOW (ref 135–145)

## 2019-12-06 LAB — CBC
HCT: 17 % — ABNORMAL LOW (ref 39.0–52.0)
HCT: 24.3 % — ABNORMAL LOW (ref 39.0–52.0)
Hemoglobin: 5.9 g/dL — CL (ref 13.0–17.0)
Hemoglobin: 8.3 g/dL — ABNORMAL LOW (ref 13.0–17.0)
MCH: 28 pg (ref 26.0–34.0)
MCH: 27.9 pg (ref 26.0–34.0)
MCHC: 34.7 g/dL (ref 30.0–36.0)
MCHC: 34.2 g/dL (ref 30.0–36.0)
MCV: 80.6 fL (ref 80.0–100.0)
MCV: 81.8 fL (ref 80.0–100.0)
Platelets: 294 10*3/uL (ref 150–400)
Platelets: 246 10*3/uL (ref 150–400)
RBC: 2.11 MIL/uL — ABNORMAL LOW (ref 4.22–5.81)
RBC: 2.97 MIL/uL — ABNORMAL LOW (ref 4.22–5.81)
RDW: 16.2 % — ABNORMAL HIGH (ref 11.5–15.5)
RDW: 14.8 % (ref 11.5–15.5)
WBC: 34.1 10*3/uL — ABNORMAL HIGH (ref 4.0–10.5)
WBC: 29.2 10*3/uL — ABNORMAL HIGH (ref 4.0–10.5)
nRBC: 2.2 % — ABNORMAL HIGH (ref 0.0–0.2)
nRBC: 4.5 % — ABNORMAL HIGH (ref 0.0–0.2)

## 2019-12-06 LAB — PREPARE RBC (CROSSMATCH)

## 2019-12-06 LAB — GLUCOSE, CAPILLARY
Glucose-Capillary: 83 mg/dL (ref 70–99)
Glucose-Capillary: 94 mg/dL (ref 70–99)
Glucose-Capillary: 77 mg/dL (ref 70–99)
Glucose-Capillary: 105 mg/dL — ABNORMAL HIGH (ref 70–99)

## 2019-12-06 LAB — HEPARIN ANTI-XA: Heparin LMW: >2 IU/mL

## 2019-12-06 LAB — ABO/RH: ABO/RH(D): B POS

## 2019-12-06 MED ORDER — SODIUM CHLORIDE 0.9 % IV SOLN: INTRAVENOUS | Status: DC

## 2019-12-06 MED ORDER — SODIUM CHLORIDE 0.9% IV SOLUTION: Freq: Once | INTRAVENOUS | Status: AC

## 2019-12-06 MED ORDER — CEFAZOLIN SODIUM-DEXTROSE 2-4 GM/100ML-% IV SOLN
2.0000 g | Freq: Two times a day (BID) | INTRAVENOUS | Status: DC
  Administered 2019-12-06 – 2019-12-08 (×4): 2 g via INTRAVENOUS
  Filled 2019-12-06 (×4): qty 100

## 2019-12-06 NOTE — Progress Notes (Signed)
eLink Physician-Brief Progress Note Patient Name: [name] DOB: 1965/11/19 MRN: [id number]   Date of Service  12/06/2019  HPI/Events of Note  Anemia - Hgb = 5.9.  eICU Interventions  Will order: 1. Transfuse 2 units PRBC.     Intervention Category Major Interventions: Other:  Jaylnn Ullery Dennard Nip 12/06/2019, 4:02 AM

## 2019-12-06 NOTE — Progress Notes (Signed)
PULMONARY / CRITICAL CARE MEDICINE   NAME:  Nathaniel Webb, MRN:  [id number], DOB:  01-22-1966, LOS: 25 ADMISSION DATE:  11/11/2019, CONSULTATION DATE:  1/12 REFERRING MD:  Joseph Art, CHIEF COMPLAINT:  Pleural effusion   BRIEF HISTORY:    Mr. Lamir Racca is a 54 y/o male, with a PMH appreciated below, admitted with COVID 19 pneumonia. Subsequently developed a loculated pleural effusion by 1/10 with associated hydropneumothorax.  SIGNIFICANT PAST MEDICAL HISTORY    Past Medical History:  Diagnosis Date  . Diabetes mellitus without complication (HCC)   . Heel pain 06/09/2015  . High triglycerides 05/09/2016  . Hypertension   . Low HDL (under 40) 05/09/2016  . Obesity, Class II, BMI 35-39.9, with comorbidity (HCC) 01/04/2016     SIGNIFICANT EVENTS:  12/29: Admitted 1/12: PCCM consulted with performance of a thoracentesis 1/13 Tx to ICU with worsening pneumothorax 1/17 intubated 1/18 Continue to have R sided pneumothorax despite chest tube. 28 french placed and extubated that evening 1/19 small bore ct removed, getting lasix. Feeling better and stronger. airleak still brisk 1/20 O2 down to 4L, still desaturating. 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 Improved overal aeration. CVTS> Continue current Rx. ALI Resolved might be VATS Candidate eventually.  1/21 hematuria>Holding LMWH with reassessment 1/22 1/22: PT ordered, patient encouraged ambulation. Start trending CBC again to follow leukocytosis. Possible ct repositioning pending CBC results.   STUDIES:   CTA Chest 12/29 >> positive for PE with small volume embolus in the distal right pulmonary artery, lobar, and some segmental branches CT ABD/Pelvis 1/10 >> moderate stool burden in colon, R>L airspace disease compatible with PNA, loculated right pleural effusion CT Chest w/o 1/10 >> multiloculated right lung hydropneumothorax, larger volume of pleural fluid relative to pleural  air, progression of multilobar right lung consolidation, small volume loculated right pneumomediastinum Thora1/12>> glucose <20, LD 2,447, protein 5.2, total nucleated cells 117,299, neutrophil 85 CT Chest w/o 1/13 >> limited exam by motion, large right hydropneumothorax with numerous thin internal septations with air fluid levels increased in size, complete right lung atelectasis, pneumomediastinum in the anterior right pericardial region, similar extensive patchy GGO on L 1/21 CT chest: IMPRESSION: 1. Decreased small to moderate right hydropneumothorax. 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 2. Improved aeration of the right lung with residual mild to moderate atelectasis. 3. Decreased pneumomediastinum in the anterior right pericardial region. 4. Extensive patchy ground-glass density and reticulation throughout the left and right lungs, improved from prior CT, remains consistent with COVID-19 pneumonia. 5. Aortic atherosclerosis CULTURES:  1/10 Blood Culture: Staph Aureus. R Erythromycin, Clindamycin,  1/12 Body Pleural Fluid: Staph Aureus. R Erythromcyin, Clindamycin ANTIBIOTICS:  Remdesivir 12/27>1/1 Tocilizumab: 12/28> Cefepime: 1/10>>1/14 Linezolid 1/10>>1/14 Cefezolin 1/14>>  LINES/TUBES:  12/27 R Antecubital 1/18 Catheter  1/19 R Chest Tube 28 French   CONSULTANTS:  PCCM Cardiothoracic Surgery PMR SUBJECTIVE:  Patient with no complaints overnight..  Has been ambulating and eating.  CONSTITUTIONAL: BP 110/65   Pulse 85   Temp 97.6 F (36.4 C) (Oral)   Resp 20   Ht 5\' 5"  (1.651 m)   Wt 91.7 kg   SpO2 95%   BMI 33.64 kg/m   I/O last 3 completed shifts: In: 550 [P.O.:350; IV Piggyback:200] Out: 1190 [Urine:1010; Chest Tube:180]        PHYSICAL EXAM: General:  Laying in bed, no acute distress Neuro:  Able to follow commands, AAOx3,  HEENT:  Normocephalic, atraumatic,  poor dentition, no scleral icterus  appreciated, on [place].  Cardiovascular:  RRR, no murmurs, rubs, or gallops Lungs: No wheezing.  Decreased air leak..  Abdomen:  Distended, BS+, non tender Musculoskeletal:  No pitting edema bilaterally.  Skin:  Warm and dry  RESOLVED PROBLEM LIST   ASSESSMENT AND PLAN    MSSA Bacteremia/Empyema with Broncho Pulmonary Fistula:  - Continue Chest tube Suction at this time - Continue Cefazolin  - Cardiothoracic Surgery Consulted, we appreciate their recommendations - CTS recommends postponement of VATS due to impaired oxygenation provided by L lung while operating on R.  - Thrombolytic therapy held due to site hematoma;  Hematuria:  Noticed 1/21 with Lovenox held.  - Reassess today  -Initiate Flomax for possible BPH.  AKI with Associated Hyponatremia:  -No acute indications for dialysis -Appears euvolemic. -Continue to monitor.   Small Acute Pulmonary Embolism:  - 12/29 PE appreciated in the Right distal pulmonary artery  - Lovenox held due to hematuria on 1/21 - Will reassess hematuria, and restart if resolution of symptoms.   Hypertension:  -Continue Medications  Diabetes Mellitis:  - Continue SSI and Levemir 18U  COVID 19 PenumoniaPneumonitis:  - Completed course of remdesevir and out of the 21 day window   SUMMARY OF TODAY'S PLAN:  Patient is stable with Chest tube placement.  Continue chest tube drainage. Best Practice / Goals of Care / Disposition.   DVT PROPHYLAXIS: Lovenox SUP:NA NUTRITION:Dysphagia 3  MOBILITY: Bed rest GOALS OF CARE:FULL FAMILY DISCUSSIONS: Updated wife on cardiothoracic surgery's recommendations for VATS procedure.  DISPOSITION ICU  LABS  Glucose Recent Labs  Lab 12/05/19 1210 12/05/19 1603 12/05/19 2136 12/06/19 0626 12/06/19 1113 12/06/19 1612  GLUCAP 192* 209* 148* 83 94 77    BMET Recent Labs  Lab 12/04/19 0505 12/05/19 0230 12/06/19 0302  NA 133* 131* 130*  K 4.0 4.5 4.5  CL 90* 89* 89*  CO2 29 22 23   BUN 32* 52*  69*  CREATININE 1.27* 2.69* 3.57*  GLUCOSE 130* 201* 139*    Liver Enzymes Recent Labs  Lab 11/30/19 0442 12/02/19 0212 12/03/19 0130  AST 89* 133* 73*  ALT 101* 166* 133*  ALKPHOS 183* 170* 159*  BILITOT 0.3 0.4 0.5  ALBUMIN 2.2* 2.1* 2.1*    Electrolytes Recent Labs  Lab 12/01/19 0400 12/02/19 0212 12/04/19 0505 12/05/19 0230 12/06/19 0302  CALCIUM 7.9*   < > 7.9* 7.4* 7.3*  MG 2.6*  --   --   --   --   PHOS 3.1  --   --   --   --    < > = values in this interval not displayed.    CBC Recent Labs  Lab 12/04/19 0505 12/05/19 1007 12/06/19 0302  WBC 29.5* 43.0* 34.1*  HGB 9.2* 8.4* 5.9*  HCT 29.0* 25.7* 17.0*  PLT 326 272 294    ABG Recent Labs  Lab 11/29/19 2337 11/30/19 0111 11/30/19 0551  PHART 7.513* 7.375 7.446  PCO2ART 37.0 51.3* 44.6  PO2ART 34.0* 94.0 100.0    Coag's No results for input(s): APTT, INR in the last 168 hours.  Sepsis Markers No results for input(s): LATICACIDVEN, PROCALCITON, O2SATVEN in the last 168 hours.  Cardiac Enzymes No results for input(s): TROPONINI, PROBNP in the last 168 hours.   Kipp Brood, MD [facility] ICU Physician [place]  Pager: [phone] Mobile: [phone] After hours: [phone].  12/06/2019, 5:38 PM

## 2019-12-06 NOTE — Progress Notes (Signed)
ANTICOAGULATION CONSULT NOTE - Follow Up Consult  Pharmacy Consult for Lovenox Indication: PE  No Known Allergies  Patient Measurements: Height: 5\' 5"  (165.1 cm) Weight: 202 lb 2.6 oz (91.7 kg) IBW/kg (Calculated) : 61.5 Heparin Dosing Weight: 85 kg  Vital Signs: Temp: 97.6 F (36.4 C) (01/23 1643) Temp Source: Oral (01/23 1643) BP: 114/68 (01/23 1900) Pulse Rate: 89 (01/23 1900)  Labs: Recent Labs    12/04/19 0505 12/05/19 0230 12/05/19 1007 12/05/19 1007 12/06/19 0302 12/06/19 1858  HGB   < >  --  8.4*   < > 5.9* 8.3*  HCT   < >  --  25.7*  --  17.0* 24.3*  PLT   < >  --  272  --  294 246  HEPRLOWMOCWT  --   --   --   --   --  >2.00  CREATININE  --  2.69*  --   --  3.57* 3.11*   < > = values in this interval not displayed.    Estimated Creatinine Clearance: 28.9 mL/min (A) (by C-G formula based on SCr of 3.11 mg/dL (H)).   Medications:  Scheduled:  . vitamin C  500 mg Oral Daily  . aspirin EC  81 mg Oral Daily  . atorvastatin  10 mg Oral QPM  . chlorhexidine  15 mL Mouth/Throat BID  . Chlorhexidine Gluconate Cloth  6 each Topical Daily  . chlorpheniramine-HYDROcodone  5 mL Oral Q12H  . enoxaparin (LOVENOX) injection  1 mg/kg Subcutaneous Q12H  . feeding supplement (ENSURE ENLIVE)  237 mL Oral TID BM  . insulin aspart  0-20 Units Subcutaneous TID WC  . insulin detemir  18 Units Subcutaneous Daily  . mouth rinse  15 mL Mouth Rinse BID  . Melatonin  3 mg Oral QHS  . metoprolol tartrate  50 mg Oral BID  . senna-docusate  3 tablet Oral BID  . sodium chloride flush  3 mL Intravenous Q12H  . zinc sulfate  220 mg Oral Daily   Infusions:  . sodium chloride 100 mL/hr at 12/06/19 1818  .  ceFAZolin (ANCEF) IV Stopped (12/06/19 1813)    Assessment: 54 yo male with COVID pneumonitis started on Eliquis for PE, held for thoracentesis 1/12 and chest tube placement on 1/13.    Currently on treatment dose Lovenox with R chest tube in place.  Lovenox level  collected today (was 3 hours late) and is supra-therapeutic >2. Likely due to increase in SCr over the last couple days  Goal of Therapy:  Anti-Xa level 0.6-1 units/ml 4hrs after LMWH dose given Monitor platelets by anticoagulation protocol: Yes   Plan:  Hold Lovenox dose until level is < 0.5, then restart Lovenox at 90 mg/kg once daily if CrCl remains < 30  Will check next Lovenox level with AM labs Follow up renal function, CBC Follow up transition back to long-term anticoagulation.  2/13, PharmD, BCPS PGY2 Cardiology Pharmacy Resident Phone [phone] 12/06/2019       8:00 PM  Please check [email or web address] for unit-specific pharmacist phone numbers

## 2019-12-07 ENCOUNTER — Inpatient Hospital Stay (HOSPITAL_COMMUNITY): Payer: Medicaid Other

## 2019-12-07 LAB — TYPE AND SCREEN
ABO/RH(D): B POS
Antibody Screen: NEGATIVE
Unit division: 0
Unit division: 0

## 2019-12-07 LAB — BASIC METABOLIC PANEL
Anion gap: 15 (ref 5–15)
BUN: 66 mg/dL — ABNORMAL HIGH (ref 6–20)
CO2: 24 mmol/L (ref 22–32)
Calcium: 7 mg/dL — ABNORMAL LOW (ref 8.9–10.3)
Chloride: 94 mmol/L — ABNORMAL LOW (ref 98–111)
Creatinine, Ser: 2.75 mg/dL — ABNORMAL HIGH (ref 0.61–1.24)
GFR calc Af Amer: 29 mL/min — ABNORMAL LOW (ref >60)
GFR calc non Af Amer: 25 mL/min — ABNORMAL LOW (ref >60)
Glucose, Bld: 113 mg/dL — ABNORMAL HIGH (ref 70–99)
Potassium: 4.2 mmol/L (ref 3.5–5.1)
Sodium: 133 mmol/L — ABNORMAL LOW (ref 135–145)

## 2019-12-07 LAB — CBC
HCT: 23.3 % — ABNORMAL LOW (ref 39.0–52.0)
Hemoglobin: 8 g/dL — ABNORMAL LOW (ref 13.0–17.0)
MCH: 28.3 pg (ref 26.0–34.0)
MCHC: 34.3 g/dL (ref 30.0–36.0)
MCV: 82.3 fL (ref 80.0–100.0)
Platelets: 255 10*3/uL (ref 150–400)
RBC: 2.83 MIL/uL — ABNORMAL LOW (ref 4.22–5.81)
RDW: 15.1 % (ref 11.5–15.5)
WBC: 25.8 10*3/uL — ABNORMAL HIGH (ref 4.0–10.5)
nRBC: 5.7 % — ABNORMAL HIGH (ref 0.0–0.2)

## 2019-12-07 LAB — BPAM RBC
Blood Product Expiration Date: 202101282359
Blood Product Expiration Date: 202101282359
ISSUE DATE / TIME: 202101231037
ISSUE DATE / TIME: 202101231335
Unit Type and Rh: 1700
Unit Type and Rh: 1700

## 2019-12-07 LAB — GLUCOSE, CAPILLARY
Glucose-Capillary: 86 mg/dL (ref 70–99)
Glucose-Capillary: 119 mg/dL — ABNORMAL HIGH (ref 70–99)
Glucose-Capillary: 117 mg/dL — ABNORMAL HIGH (ref 70–99)
Glucose-Capillary: 141 mg/dL — ABNORMAL HIGH (ref 70–99)

## 2019-12-07 LAB — HEPARIN ANTI-XA: Heparin LMW: 1.55 IU/mL

## 2019-12-07 MED ORDER — SODIUM CHLORIDE 0.9% FLUSH: 10.0000 mL | INTRAVENOUS | Status: DC | PRN

## 2019-12-07 MED ORDER — SODIUM CHLORIDE 0.9% FLUSH
10.0000 mL | Freq: Two times a day (BID) | INTRAVENOUS | Status: DC
  Administered 2019-12-07 – 2019-12-08 (×3): 10 mL
  Administered 2019-12-10: 20 mL
  Administered 2019-12-15 – 2019-12-17 (×4): 10 mL

## 2019-12-07 NOTE — Plan of Care (Signed)
Poc progressing.  

## 2019-12-07 NOTE — Progress Notes (Signed)
ANTICOAGULATION CONSULT NOTE - Follow Up Consult  Pharmacy Consult for Lovenox Indication: PE  No Known Allergies  Patient Measurements: Height: 5\' 5"  (165.1 cm) Weight: 202 lb 2.6 oz (91.7 kg) IBW/kg (Calculated) : 61.5 Heparin Dosing Weight: 85 kg  Vital Signs: Temp: 97.9 F (36.6 C) (01/24 0330) Temp Source: Oral (01/24 0330) BP: 108/72 (01/24 0330) Pulse Rate: 80 (01/24 0330)  Labs: Recent Labs    12/06/19 0302 12/06/19 0302 12/06/19 1858 12/07/19 0305  HGB 5.9*   < > 8.3* 8.0*  HCT 17.0*  --  24.3* 23.3*  PLT 294  --  246 255  HEPRLOWMOCWT  --   --  >2.00 1.55  CREATININE 3.57*  --  3.11* 2.75*   < > = values in this interval not displayed.    Estimated Creatinine Clearance: 32.7 mL/min (A) (by C-G formula based on SCr of 2.75 mg/dL (H)).   Medications:  Scheduled:  . vitamin C  500 mg Oral Daily  . aspirin EC  81 mg Oral Daily  . atorvastatin  10 mg Oral QPM  . chlorhexidine  15 mL Mouth/Throat BID  . Chlorhexidine Gluconate Cloth  6 each Topical Daily  . chlorpheniramine-HYDROcodone  5 mL Oral Q12H  . feeding supplement (ENSURE ENLIVE)  237 mL Oral TID BM  . insulin aspart  0-20 Units Subcutaneous TID WC  . insulin detemir  18 Units Subcutaneous Daily  . mouth rinse  15 mL Mouth Rinse BID  . Melatonin  3 mg Oral QHS  . metoprolol tartrate  50 mg Oral BID  . senna-docusate  3 tablet Oral BID  . sodium chloride flush  3 mL Intravenous Q12H  . zinc sulfate  220 mg Oral Daily   Infusions:  . sodium chloride 100 mL/hr at 12/07/19 0525  .  ceFAZolin (ANCEF) IV 2 g (12/07/19 12/09/19)    Assessment: 54 yo male with COVID pneumonitis started on Eliquis for PE, held for thoracentesis 1/12 and chest tube placement on 1/13.    Currently on treatment dose Lovenox with R chest tube in place that is currently on hold due to elevated anti-Xa level. Last dose administered 1/23 at 1206   Lovenox level collected this morning remains supra-therapeutic at 1.55. Likely  due to AKI. Patient's renal function is noted to be improving with a SrCr today of 2.75. H&H is remaining stable, today at 8/23.3, plts are wnl at 255. Of note, patient did receive pRBC yesterday.   Goal of Therapy:  Anti-Xa level 0.6-1 units/ml 4hrs after LMWH dose given Monitor platelets by anticoagulation protocol: Yes   Plan:  Hold Lovenox dose until level is < 0.5, then restart Lovenox at 90 mg/kg once daily if CrCl remains < 30 mL/min or 90 mg/kg twice daily if CrCl > 30 mL/min Will check daily Lovenox level with AM labs  Follow up renal function, CBC Follow up transition back to long-term anticoagulation.   Thank you,   2/23, PharmD PGY-1 Pharmacy Resident   Please check amion for clinical pharmacist contact number

## 2019-12-07 NOTE — Progress Notes (Signed)
PULMONARY / CRITICAL CARE MEDICINE   NAME:  AYOMIDE PURDY, MRN:  [id number], DOB:  12-14-1965, LOS: 26 ADMISSION DATE:  11/11/2019, CONSULTATION DATE:  1/12 REFERRING MD:  Joseph Art, CHIEF COMPLAINT:  Pleural effusion   BRIEF HISTORY:    Mr. D'Arcy Abraha is a 54 y/o male, with a PMH appreciated below, admitted with COVID 19 pneumonia. Subsequently developed a loculated pleural effusion by 1/10 with associated hydropneumothorax.  SIGNIFICANT PAST MEDICAL HISTORY    Past Medical History:  Diagnosis Date  . Diabetes mellitus without complication (HCC)   . Heel pain 06/09/2015  . High triglycerides 05/09/2016  . Hypertension   . Low HDL (under 40) 05/09/2016  . Obesity, Class II, BMI 35-39.9, with comorbidity (HCC) 01/04/2016     SIGNIFICANT EVENTS:  12/29: Admitted 1/12: PCCM consulted with performance of a thoracentesis 1/13 Tx to ICU with worsening pneumothorax 1/17 intubated 1/18 Continue to have R sided pneumothorax despite chest tube. 28 french placed and extubated that evening 1/19 small bore ct removed, getting lasix. Feeling better and stronger. airleak still brisk 1/20 O2 down to 4L, still desaturating. 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 Improved overal aeration. CVTS> Continue current Rx. ALI Resolved might be VATS Candidate eventually.  1/21 hematuria>Holding LMWH with reassessment 1/22 1/22: PT ordered, patient encouraged ambulation. Start trending CBC again to follow leukocytosis. Possible ct repositioning pending CBC results.   STUDIES:   CTA Chest 12/29 >> positive for PE with small volume embolus in the distal right pulmonary artery, lobar, and some segmental branches CT ABD/Pelvis 1/10 >> moderate stool burden in colon, R>L airspace disease compatible with PNA, loculated right pleural effusion CT Chest w/o 1/10 >> multiloculated right lung hydropneumothorax, larger volume of pleural fluid relative to pleural  air, progression of multilobar right lung consolidation, small volume loculated right pneumomediastinum Thora1/12>> glucose <20, LD 2,447, protein 5.2, total nucleated cells 117,299, neutrophil 85 CT Chest w/o 1/13 >> limited exam by motion, large right hydropneumothorax with numerous thin internal septations with air fluid levels increased in size, complete right lung atelectasis, pneumomediastinum in the anterior right pericardial region, similar extensive patchy GGO on L 1/21 CT chest: IMPRESSION: 1. Decreased small to moderate right hydropneumothorax. 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 2. Improved aeration of the right lung with residual mild to moderate atelectasis. 3. Decreased pneumomediastinum in the anterior right pericardial region. 4. Extensive patchy ground-glass density and reticulation throughout the left and right lungs, improved from prior CT, remains consistent with COVID-19 pneumonia. 5. Aortic atherosclerosis CULTURES:  1/10 Blood Culture: Staph Aureus. R Erythromycin, Clindamycin,  1/12 Body Pleural Fluid: Staph Aureus. R Erythromcyin, Clindamycin ANTIBIOTICS:  Remdesivir 12/27>1/1 Tocilizumab: 12/28> Cefepime: 1/10>>1/14 Linezolid 1/10>>1/14 Cefezolin 1/14>>  LINES/TUBES:  12/27 R Antecubital 1/18 Catheter  1/19 R Chest Tube 28 French   CONSULTANTS:  PCCM Cardiothoracic Surgery PMR SUBJECTIVE:  Patient with no complaints overnight..  Has been ambulating and eating. Denies dyspnea.  CONSTITUTIONAL: BP 124/76 (BP Location: Left Arm)   Pulse 92   Temp 98.1 F (36.7 C) (Oral)   Resp (!) 26   Ht 5\' 5"  (1.651 m)   Wt 91.7 kg   SpO2 100%   BMI 33.64 kg/m   I/O last 3 completed shifts: In: 830 [P.O.:100; Blood:630; IV Piggyback:100] Out: 2540 [Urine:2500; Chest Tube:40]        PHYSICAL EXAM: General:  Laying in bed, no acute distress Neuro:  Able to  follow commands, AAOx3,  HEENT:  Normocephalic,  atraumatic, poor dentition, no scleral icterus appreciated, on [place].  Cardiovascular:  RRR, no murmurs, rubs, or gallops Lungs: No wheezing.  Decreased air leak..  Abdomen:  Distended, BS+, non tender Musculoskeletal:  No pitting edema bilaterally.  Skin:  Warm and dry  RESOLVED PROBLEM LIST   ASSESSMENT AND PLAN    MSSA Bacteremia/Empyema with Broncho Pulmonary Fistula:  - Continue Chest tube Suction at this time - Continue Cefazolin  - Cardiothoracic Surgery Consulted, we appreciate their recommendations - CTS recommends postponement of VATS due to impaired oxygenation provided by L lung while operating on R.  - Repeat CT chest as Chest tube likely needs to be repositioned.  Hematuria:  Noticed 1/21 with Lovenox held.  - Reassess today  -Initiate Flomax for possible BPH.  AKI with Associated Hyponatremia:  -No acute indications for dialysis -Continue iV hydration.   Small Acute Pulmonary Embolism:  - 12/29 PE appreciated in the Right distal pulmonary artery  - Lovenox held due to hematuria on 1/21 - Will reassess hematuria, and restart if resolution of symptoms.   Hypertension:  -Continue Medications  Diabetes Mellitis:  - Continue SSI and Levemir 18U  COVID 19 PenumoniaPneumonitis:  - Completed course of remdesevir and out of the 21 day window   SUMMARY OF TODAY'S PLAN:  Patient is stable with Chest tube placement.  Continue chest tube drainage. Best Practice / Goals of Care / Disposition.   DVT PROPHYLAXIS: Lovenox SUP:NA NUTRITION:Dysphagia 3  MOBILITY: Bed rest GOALS OF CARE:FULL FAMILY DISCUSSIONS: Updated wife on cardiothoracic surgery's recommendations for VATS procedure.  DISPOSITION ICU  LABS  Glucose Recent Labs  Lab 12/06/19 1113 12/06/19 1612 12/06/19 2110 12/07/19 0610 12/07/19 1110 12/07/19 1651  GLUCAP 94 77 105* 86 119* 117*    BMET Recent Labs  Lab 12/06/19 0302 12/06/19 1858 12/07/19 0305  NA 130* 130* 133*  K 4.5 3.8 4.2  CL  89* 89* 94*  CO2 23 25 24   BUN 69* 67* 66*  CREATININE 3.57* 3.11* 2.75*  GLUCOSE 139* 146* 113*    Liver Enzymes Recent Labs  Lab 12/02/19 0212 12/03/19 0130  AST 133* 73*  ALT 166* 133*  ALKPHOS 170* 159*  BILITOT 0.4 0.5  ALBUMIN 2.1* 2.1*    Electrolytes Recent Labs  Lab 12/01/19 0400 12/02/19 0212 12/06/19 0302 12/06/19 1858 12/07/19 0305  CALCIUM 7.9*   < > 7.3* 7.3* 7.0*  MG 2.6*  --   --   --   --   PHOS 3.1  --   --   --   --    < > = values in this interval not displayed.    CBC Recent Labs  Lab 12/06/19 0302 12/06/19 1858 12/07/19 0305  WBC 34.1* 29.2* 25.8*  HGB 5.9* 8.3* 8.0*  HCT 17.0* 24.3* 23.3*  PLT 294 246 255    ABG No results for input(s): PHART, PCO2ART, PO2ART in the last 168 hours.  Coag's No results for input(s): APTT, INR in the last 168 hours.  Sepsis Markers No results for input(s): LATICACIDVEN, PROCALCITON, O2SATVEN in the last 168 hours.  Cardiac Enzymes No results for input(s): TROPONINI, PROBNP in the last 168 hours.   Kipp Brood, MD [facility] ICU Physician [place]  Pager: [phone] Mobile: [phone] After hours: [phone].  12/07/2019, 6:12 PM

## 2019-12-07 NOTE — Progress Notes (Signed)
Occupational Therapy Treatment Patient Details Name: Nathaniel Webb MRN: [id number] DOB: 1966-06-14 Today's Date: 12/07/2019    History of present illness 54 y/o male w/ hx of Obesity, low HDL, HTN, DM, presented to [facility] (12/29) with approximately 1 week history of gradually worsening shortness of breath.  He was found to have acute hypoxic respiratory failure and was requiring high flow oxygen to maintain O2 saturations.  Chest x-ray was typical of Covid pneumonitis. Pt dx w/ COVID 12/23 and dc home w/ spouse. 1/10 CXR showed ARDS bilateral increasing opacification; respiratory status worsened; concerns for aspiration. Acute PE; loculated right pleural effusion; 1/11 CTS consult, recs chest tube. 1/12 CT chest with densely consolidated right lung, air fluid level/ hydropneumothorax. Thoracentesis 1/12; pleural fluid sampled and concerning for empyema.  1/13 emergently transferred to ICU for worsening hypoxia; CT showed worsening right hydropneumothorax with loculations in pleura. Emergent right chest tube placed. Pt intubated 11/30/19-12/01/19. Pt developed hematuria on 1/21.   OT comments  Pt making slow, but steady progress.  He was limited by anxiety this date, and requires increased time to complete tasks.  His 02 sats decreased to 86% on 5L with activity, but he was able to maintain sats in the 90s with activity while on 6L.  He requires min A for functional transfers and min - max A for ADLs.  Will continue to follow. c  Follow Up Recommendations  CIR    Equipment Recommendations  None recommended by OT    Recommendations for Other Services      Precautions / Restrictions Precautions Precautions: Fall;Other (comment) Precaution Comments: R sided Chest tube, monitor SpO2       Mobility Bed Mobility Overal bed mobility: Needs Assistance Bed Mobility: Sit to Supine;Supine to Sit     Supine to sit: Min assist Sit to supine: Min assist   General bed mobility comments: Pt requires  increased time to complete task due to anxiety and fear of dyspnea with movement.  Requires min cues for sequencing, assist to initiate LE movement off bed, and assist to lift trunk   Transfers Overall transfer level: Needs assistance Equipment used: Rolling walker (2 wheeled) Transfers: Sit to/from Stand Sit to Stand: Min assist         General transfer comment: Pt requires min A to boost into standing, and min A to side step up EOB     Balance Overall balance assessment: Needs assistance Sitting-balance support: Bilateral upper extremity supported;Feet unsupported Sitting balance-Leahy Scale: Fair Sitting balance - Comments: able to maintain static sitting with min guard assist    Standing balance support: Bilateral upper extremity supported Standing balance-Leahy Scale: Poor Standing balance comment: reliant on bil. UE support                            ADL either performed or assessed with clinical judgement   ADL Overall ADL's : Needs assistance/impaired Eating/Feeding: Set up;Supervision/ safety;Sitting                       Toilet Transfer: Minimal assistance;BSC           Functional mobility during ADLs: Minimal assistance;+2 for safety/equipment       Vision       Perception     Praxis      Cognition Arousal/Alertness: Awake/alert Behavior During Therapy: Anxious Overall Cognitive Status: Within Functional Limits for tasks assessed  General Comments: Pt very anxious during session         Exercises     Shoulder Instructions       General Comments Pt initially on 5L 02 via  with sats in the mid 90s, but RR in the 3s.  Upon moving to EOB, 02 sats decreased to 86% with RR in the 40s.  Pt instructed in pursed lip breathing, but sats did not rebound, so incresaed 02 to 6L with sats improving  to 96% and able to maintain sats in the 90s with activity on 6L 02.   Pt provided with IS  and flutter valve as his did not move over from [place].  Encouraged him to use them hourly     Pertinent Vitals/ Pain       Pain Assessment: No/denies pain  Home Living                                          Prior Functioning/Environment              Frequency  Min 3X/week        Progress Toward Goals  OT Goals(current goals can now be found in the care plan section)  Progress towards OT goals: Not progressing toward goals - comment(due to decreased activity tolerance )     Plan Discharge plan remains appropriate    Co-evaluation                 AM-PAC OT "6 Clicks" Daily Activity     Outcome Measure   Help from another person eating meals?: A Little Help from another person taking care of personal grooming?: A Little Help from another person toileting, which includes using toliet, bedpan, or urinal?: A Little Help from another person bathing (including washing, rinsing, drying)?: A Lot Help from another person to put on and taking off regular upper body clothing?: A Little Help from another person to put on and taking off regular lower body clothing?: A Lot 6 Click Score: 16    End of Session Equipment Utilized During Treatment: Rolling walker  OT Visit Diagnosis: Unsteadiness on feet (R26.81);Muscle weakness (generalized) (M62.81)   Activity Tolerance Patient limited by fatigue   Patient Left in bed;with call bell/phone within reach;with nursing/sitter in room   Nurse Communication Mobility status        Time: 1027-2536 OT Time Calculation (min): 38 min  Charges: OT General Charges $OT Visit: 1 Visit OT Treatments $Therapeutic Activity: 38-52 mins  Nilsa Nutting., OTR/L Acute Rehabilitation Services Pager [phone] Office [place], [place] 12/07/2019, 5:55 PM

## 2019-12-07 NOTE — Progress Notes (Signed)
eLink Physician-Brief Progress Note Patient Name: Nathaniel Webb [place] DOB: [birth year] MRN: [id number]   Date of Service  12/07/2019  HPI/Events of Note  Called by radiologist about his Chest CT Scan without contrast. Findings: 1. R pneumothorax which is stable. 2. New RLL collection which could be abcess vs hematoma. Radiologist recommended Chest CTA to r/o hematoma. If negative for hematoma, consider IR guided drainage. However, his creatinine = 2.75. The radiologist and I felt that his kidneys would not tolerate the dye load required for CTA.   eICU Interventions  Unfortunately, the best option for now is to follow serial imaging studies. Will defer further w/u to the rounding team. If he deteriorates, would proceed with Chest CTA.      Intervention Category Intermediate Interventions: Diagnostic test evaluation  Lenell Antu 12/07/2019, 10:44 PM

## 2019-12-08 ENCOUNTER — Inpatient Hospital Stay (HOSPITAL_COMMUNITY): Payer: Medicaid Other

## 2019-12-08 DIAGNOSIS — J939 Pneumothorax, unspecified: Secondary | ICD-10-CM

## 2019-12-08 DIAGNOSIS — Z9889 Other specified postprocedural states: Secondary | ICD-10-CM

## 2019-12-08 DIAGNOSIS — D500 Iron deficiency anemia secondary to blood loss (chronic): Secondary | ICD-10-CM

## 2019-12-08 DIAGNOSIS — U071 COVID-19: Secondary | ICD-10-CM

## 2019-12-08 DIAGNOSIS — I2699 Other pulmonary embolism without acute cor pulmonale: Secondary | ICD-10-CM

## 2019-12-08 LAB — BASIC METABOLIC PANEL
Anion gap: 13 (ref 5–15)
BUN: 48 mg/dL — ABNORMAL HIGH (ref 6–20)
CO2: 26 mmol/L (ref 22–32)
Calcium: 7.2 mg/dL — ABNORMAL LOW (ref 8.9–10.3)
Chloride: 100 mmol/L (ref 98–111)
Creatinine, Ser: 1.94 mg/dL — ABNORMAL HIGH (ref 0.61–1.24)
GFR calc Af Amer: 45 mL/min — ABNORMAL LOW (ref >60)
GFR calc non Af Amer: 39 mL/min — ABNORMAL LOW (ref >60)
Glucose, Bld: 117 mg/dL — ABNORMAL HIGH (ref 70–99)
Potassium: 3.8 mmol/L (ref 3.5–5.1)
Sodium: 139 mmol/L (ref 135–145)

## 2019-12-08 LAB — CBC
HCT: 22.5 % — ABNORMAL LOW (ref 39.0–52.0)
Hemoglobin: 7.3 g/dL — ABNORMAL LOW (ref 13.0–17.0)
MCH: 27.7 pg (ref 26.0–34.0)
MCHC: 32.4 g/dL (ref 30.0–36.0)
MCV: 85.2 fL (ref 80.0–100.0)
Platelets: 241 10*3/uL (ref 150–400)
RBC: 2.64 MIL/uL — ABNORMAL LOW (ref 4.22–5.81)
RDW: 16.1 % — ABNORMAL HIGH (ref 11.5–15.5)
WBC: 16.4 10*3/uL — ABNORMAL HIGH (ref 4.0–10.5)
nRBC: 2 % — ABNORMAL HIGH (ref 0.0–0.2)

## 2019-12-08 LAB — GLUCOSE, CAPILLARY
Glucose-Capillary: 84 mg/dL (ref 70–99)
Glucose-Capillary: 145 mg/dL — ABNORMAL HIGH (ref 70–99)
Glucose-Capillary: 125 mg/dL — ABNORMAL HIGH (ref 70–99)
Glucose-Capillary: 92 mg/dL (ref 70–99)

## 2019-12-08 LAB — PATHOLOGIST SMEAR REVIEW

## 2019-12-08 LAB — HEPARIN ANTI-XA: Heparin LMW: 0.26 IU/mL

## 2019-12-08 MED ORDER — ENOXAPARIN SODIUM 100 MG/ML ~~LOC~~ SOLN
90.0000 mg | SUBCUTANEOUS | Status: DC
  Administered 2019-12-08: 90 mg via SUBCUTANEOUS
  Filled 2019-12-08: qty 1

## 2019-12-08 MED ORDER — DOCUSATE SODIUM 100 MG PO CAPS
200.0000 mg | ORAL_CAPSULE | Freq: Two times a day (BID) | ORAL | Status: DC
  Administered 2019-12-08 – 2019-12-17 (×9): 200 mg via ORAL
  Filled 2019-12-08 (×13): qty 2

## 2019-12-08 MED ORDER — PSYLLIUM 95 % PO PACK
1.0000 | PACK | Freq: Every day | ORAL | Status: DC
  Administered 2019-12-08 – 2019-12-17 (×7): 1 via ORAL
  Filled 2019-12-08 (×7): qty 1

## 2019-12-08 MED ORDER — POLYETHYLENE GLYCOL 3350 17 G PO PACK
17.0000 g | PACK | Freq: Every day | ORAL | Status: DC
  Administered 2019-12-10 – 2019-12-13 (×3): 17 g via ORAL
  Filled 2019-12-08 (×5): qty 1

## 2019-12-08 MED ORDER — FLUTICASONE PROPIONATE 50 MCG/ACT NA SUSP
2.0000 | Freq: Two times a day (BID) | NASAL | Status: DC
  Administered 2019-12-08 – 2019-12-17 (×19): 2 via NASAL
  Filled 2019-12-08: qty 16

## 2019-12-08 MED ORDER — CEFAZOLIN SODIUM-DEXTROSE 2-4 GM/100ML-% IV SOLN
2.0000 g | Freq: Three times a day (TID) | INTRAVENOUS | Status: DC
  Administered 2019-12-08 – 2019-12-19 (×32): 2 g via INTRAVENOUS
  Filled 2019-12-08 (×35): qty 100

## 2019-12-08 NOTE — Progress Notes (Signed)
ANTICOAGULATION CONSULT NOTE - Follow Up Consult  Pharmacy Consult for Lovenox Indication: pulmonary embolus  Labs: Recent Labs    12/06/19 0302 12/06/19 0302 12/06/19 1858 12/06/19 1858 12/07/19 0305 12/08/19 0249  HGB 5.9*   < > 8.3*   < > 8.0* 7.3*  HCT 17.0*   < > 24.3*  --  23.3* 22.5*  PLT 294   < > 246  --  255 241  HEPRLOWMOCWT  --   --  >2.00  --  1.55 0.26  CREATININE 3.57*  --  3.11*  --  2.75*  --    < > = values in this interval not displayed.    Assessment/Plan:  LMWH level now below goal after significant accumulation; will start Lovenox 90mg  SQ Q24H.  , PharmD, BCPS  12/08/2019,4:20 AM

## 2019-12-08 NOTE — Progress Notes (Signed)
PHARMACY NOTE:  ANTIMICROBIAL RENAL DOSAGE ADJUSTMENT  Current antimicrobial regimen includes a mismatch between antimicrobial dosage and estimated renal function.  As per policy approved by the Pharmacy & Therapeutics and Medical Executive Committees, the antimicrobial dosage will be adjusted accordingly.  Current antimicrobial dosage:  Cefazolin 2 gm q 12 hours   Indication: MSSA bacteremia/empyema  Renal Function:  Estimated Creatinine Clearance: 46.4 mL/min (A) (by C-G formula based on SCr of 1.94 mg/dL (H)). []      On intermittent HD, scheduled: []      On CRRT    Antimicrobial dosage has been changed to:  Cefazolin 2 gm q 8 hours   Additional comments:Adjusting dose for improved renal function/urine output    Thank you for allowing pharmacy to be a part of this patient's care.  , PharmD, BCPS, BCIDP Infectious Diseases Clinical Pharmacist Phone: [phone] 12/08/2019 8:14 AM

## 2019-12-08 NOTE — Progress Notes (Signed)
  Subjective: Patient examined and images of most recent CT scan of chest without contrast taken over the weekend personally reviewed. The patient's sepsis is significant improved over the past 72 hours with decreased white count, decreased fever and improving renal function.  He still has a space in the right apex and a fairly large air leak which likely will require VATS for definitive therapy.  However because both his lungs still show evidence of bilateral Covid pneumonitis, major thoracic operation would be at high risk for problems with oxygenation and high risk for major morbidity[prolonged ventilator dependence] and mortality. Objective: Vital signs in last 24 hours: Temp:  [97.8 F (36.6 C)-98.9 F (37.2 C)] 97.9 F (36.6 C) (01/25 1157) Pulse Rate:  [80-92] 82 (01/25 1157) Cardiac Rhythm: Normal sinus rhythm (01/25 0700) Resp:  [21-35] 29 (01/25 1157) BP: (107-130)/(72-80) 130/80 (01/25 1157) SpO2:  [98 %-100 %] 100 % (01/25 1157)  Hemodynamic parameters for last 24 hours:  Stable  Intake/Output from previous day: 01/24 0701 - 01/25 0700 In: 2163.9 [P.O.:900; I.V.:1000; IV Piggyback:263.9] Out: 2415 [Urine:2275; Chest Tube:140] Intake/Output this shift: Total I/O In: -  Out: 200 [Urine:200]  3-4 calm airleak with cough through right chest tube Bilateral rhonchi, wheezing left lung greater than right Abdomen nontender nondistended Sinus rhythm without friction rub  Lab Results: Recent Labs    12/07/19 0305 12/08/19 0249  WBC 25.8* 16.4*  HGB 8.0* 7.3*  HCT 23.3* 22.5*  PLT 255 241   BMET:  Recent Labs    12/07/19 0305 12/08/19 0249  NA 133* 139  K 4.2 3.8  CL 94* 100  CO2 24 26  GLUCOSE 113* 117*  BUN 66* 48*  CREATININE 2.75* 1.94*  CALCIUM 7.0* 7.2*    PT/INR: No results for input(s): LABPROT, INR in the last 72 hours. ABG    Component Value Date/Time   PHART 7.446 11/30/2019 0551   HCO3 30.8 (H) 11/30/2019 0551   TCO2 32 11/30/2019 0551   ACIDBASEDEF 4.1 (H) 11/09/2019 2210   O2SAT 98.0 11/30/2019 0551   CBG (last 3)  Recent Labs    12/07/19 2041 12/08/19 0606 12/08/19 1248  GLUCAP 141* 84 145*    Assessment/Plan: S/P  Continue antibiotics and chest tube drainage which appear to be helping his sepsis significantly.  Later VATS to treat persistent air leak and right apical space.  We will follow.   LOS: 27 days    Kathlee Nations Trigt III 12/08/2019

## 2019-12-08 NOTE — Progress Notes (Signed)
PULMONARY / CRITICAL CARE MEDICINE   NAME:  Nathaniel Webb, MRN:  [id number], DOB:  Dec 24, 1965, LOS: 27 ADMISSION DATE:  11/11/2019, CONSULTATION DATE:  1/12 REFERRING MD:  Nathaniel Webb, CHIEF COMPLAINT:  Pleural effusion   BRIEF HISTORY:    [name] is a 54 y/o male, with a PMH appreciated below, admitted with COVID 19 pneumonia. Subsequently developed a loculated pleural effusion by 1/10 with associated hydropneumothorax.  SIGNIFICANT PAST MEDICAL HISTORY    Past Medical History:  Diagnosis Date  . Diabetes mellitus without complication (HCC)   . Heel pain 06/09/2015  . High triglycerides 05/09/2016  . Hypertension   . Low HDL (under 40) 05/09/2016  . Obesity, Class II, BMI 35-39.9, with comorbidity (HCC) 01/04/2016     SIGNIFICANT EVENTS:  12/29: Admitted 1/12: PCCM consulted with performance of a thoracentesis 1/13 Tx to ICU with worsening pneumothorax 1/17 intubated 1/18 Continue to have R sided pneumothorax despite chest tube. 28 french placed and extubated that evening 1/19 small bore ct removed, getting lasix. Feeling better and stronger. airleak still brisk 1/20 O2 down to 4L, still desaturating. 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 Improved overal aeration. CVTS> Continue current Rx. ALI Resolved might be VATS Candidate eventually.  1/21 hematuria>Holding LMWH with reassessment 1/22 1/22: PT ordered, patient encouraged ambulation. Start trending CBC again to follow leukocytosis. Possible ct repositioning pending CBC results.   STUDIES:   CTA Chest 12/29 >> positive for PE with small volume embolus in the distal right pulmonary artery, lobar, and some segmental branches CT ABD/Pelvis 1/10 >> moderate stool burden in colon, R>L airspace disease compatible with PNA, loculated right pleural effusion CT Chest w/o 1/10 >> multiloculated right lung hydropneumothorax, larger volume of pleural fluid relative to pleural  air, progression of multilobar right lung consolidation, small volume loculated right pneumomediastinum Thora1/12>> glucose <20, LD 2,447, protein 5.2, total nucleated cells 117,299, neutrophil 85 CT Chest w/o 1/13 >> limited exam by motion, large right hydropneumothorax with numerous thin internal septations with air fluid levels increased in size, complete right lung atelectasis, pneumomediastinum in the anterior right pericardial region, similar extensive patchy GGO on L 1/21 CT chest: IMPRESSION: 1. Decreased small to moderate right hydropneumothorax. 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 2. Improved aeration of the right lung with residual mild to moderate atelectasis. 3. Decreased pneumomediastinum in the anterior right pericardial region. 4. Extensive patchy ground-glass density and reticulation throughout the left and right lungs, improved from prior CT, remains consistent with COVID-19 pneumonia. 5. Aortic atherosclerosis 1/24 CT chest 1. Interval development of a 12 x 11 cm collection that appears to be centered within the right lower lobe parenchyma. This is suboptimally evaluated in the absence of IV contrast. Differential considerations include a lung abscess or hematoma or a combination of both. If there is clinical concern for active bleeding, a contrast enhanced CT of the chest timed as a PE study is recommended. 2. Persistent right-sided pneumothorax with a well-positioned right-sided chest tube. Overall, the size of the pneumothorax is relatively stable. 3. Small to moderate size right-sided pleural effusion. 4. Persistent diffuse bilateral airspace opacities similar to prior study. CULTURES:  1/10 Blood Culture: Staph Aureus. R Erythromycin, Clindamycin,  1/12 Body Pleural Fluid: Staph Aureus. R Erythromcyin, Clindamycin ANTIBIOTICS:  Remdesivir 12/27>1/1 Tocilizumab: 12/28> Cefepime: 1/10>>1/14 Linezolid  1/10>>1/14 Cefezolin 1/14>>  LINES/TUBES:  12/27 R Antecubital 1/18 Catheter  1/19 R Chest Tube 28 [place]  CONSULTANTS:  PCCM Cardiothoracic Surgery PMR SUBJECTIVE:  Complains of constipation and nasal congestion. His shortness of breath has not changed significantly   CONSTITUTIONAL: Blood Pressure 123/73   Pulse 88   Temperature 97.8 F (36.6 C) (Oral)   Respiration (Abnormal) 35   Height 5\' 5"  (1.651 m)   Weight 91.7 kg   Oxygen Saturation 98%   Body Mass Index 33.64 kg/m   I/O last 3 completed shifts: In: 2163.9 [P.O.:900; I.V.:1000; IV Piggyback:263.9] Out: 5916 [Urine:3250; Chest Tube:140]        PHYSICAL EXAM: General this is a pleasant 54 year old aam resting in bed. Not in acute distress HENT NCAT no JVD Pulm still w/ scattered rhonchi continues to have brisk 7/7 airleak from right CT, there is still a palpable firm hematoma at the CT insertion site. This does not seem larger than when first noted on 1/21 Oxygen requirements 5 liters Card RRR abd not tender but feels "bloated" + bowel sounds Neuro intact GU clear, no hematuria   RESOLVED PROBLEM LIST  Acute hypoxic respiratory failure 2/2 COVID PNA w/ ALI Hematuria  ASSESSMENT AND PLAN    MSSA Bacteremia/Empyema with Broncho Pulmonary Fistula:  Still has loculated pleural collection on left as well as new right basilar parenchymal fluid collection.  I am worried that this is a hemorrhagic abscess as he has continued to drop his hgb Plan Cont current CT to sxn for BPF Would be ideal to get Contrasted CT of chest to better eval new fluid collection BUT scr elevated His WBC count is coming down and no fever spike but would have low threshold for changing abx Thoracic surgery has recommended CT guided pig tail on the right loculated collection  Re: the parenchymal collection Cont abx, serial films, consider getting IVC filter and stopping high dose LMWH ? Will d/w attending   Hematuria  resolved Noticed 1/21 with Lovenox held.  - Reassess today  Plan flowmax   Acute blood loss anemia  Has required several units of blood over last 3-4 days. hgb at lowest was 5.9 on 1/23. As dropped about 1 gm over last 48 hrs. His hematoma at CT site has not changed much. He does have new parenchymal density which I am concerned is a hemorrhagic abscess Plan  Stop LMWH LE [place]  Serial cnc   AKI with Associated Hyponatremia:  -No acute indications for dialysis, improved w/ IV hydration Plan Holding diuretics  Decrease IVF to 50 ml/hr   Small Acute Pulmonary Embolism:  - 12/29 PE appreciated in the Right distal pulmonary artery  Plan Checking LE dopplers   Hypertension:  Plan Cont home meds   Diabetes Mellitis:  Plan Cont ssi and basal dosing   Constipation Plan Add stool softener and LOC  Nasal congestion Plan    Best Practice / Goals of Care / Disposition.   DVT PROPHYLAXIS: Lovenox SUP:NA NUTRITION:Dysphagia 3  MOBILITY: Bed rest GOALS OF CARE:FULL FAMILY DISCUSSIONS: Updated wife on cardiothoracic surgery's recommendations for VATS procedure.  DISPOSITION progressive care  Erick Colace ACNP-BC [place] Pager # [phone] OR # [phone] if no answer

## 2019-12-08 NOTE — Progress Notes (Signed)
Physical Therapy Treatment Patient Details Name: Nathaniel Webb MRN: [id number] DOB: 1966-03-03 Today's Date: 12/08/2019    History of Present Illness 54 y/o male w/ hx of Obesity, low HDL, HTN, DM, presented to [facility] (12/29) with approximately 1 week history of gradually worsening shortness of breath.  He was found to have acute hypoxic respiratory failure and was requiring high flow oxygen to maintain O2 saturations.  Chest x-ray was typical of Covid pneumonitis. Pt dx w/ COVID 12/23 and dc home w/ spouse. 1/10 CXR showed ARDS bilateral increasing opacification; respiratory status worsened; concerns for aspiration. Acute PE; loculated right pleural effusion; 1/11 CTS consult, recs chest tube. 1/12 CT chest with densely consolidated right lung, air fluid level/ hydropneumothorax. Thoracentesis 1/12; pleural fluid sampled and concerning for empyema.  1/13 emergently transferred to ICU for worsening hypoxia; CT showed worsening right hydropneumothorax with loculations in pleura. Emergent right chest tube placed. Pt intubated 11/30/19-12/01/19. Pt developed hematuria on 1/21.    PT Comments    Pt in bed upon arrival of PT, agreeable only to limited PT session focused on bed-level exercises rather than progression of OOB mobility due to pt concern for respiratory status. Pt was educated on importance of OOB mobility for improvement in respiratory endurance, and pt verbalized agreement, but continued to decline. Pt was educated in 4 bed-level exercises and instructed to perform 2 sets of each, 3x/day to improve LE strength between PT visits in order to facilitate return to mobility. The pt was able to maintain SpO2 >94% with all LE exercises on 5L O2. PT will continue to follow acutely to continue to progress LE strengthening as well as functional mobility and activity tolerance.     Follow Up Recommendations  CIR(will need improvement in activity tolerance)     Equipment Recommendations  Rolling walker  with 5" wheels;3in1 (PT);Other (comment)(home O2 likely)    Recommendations for Other Services       Precautions / Restrictions Precautions Precautions: Fall;Other (comment) Precaution Comments: R sided Chest tube, monitor SpO2 Restrictions Weight Bearing Restrictions: No    Mobility  Bed Mobility Overal bed mobility: (pt declined due to concern for breathing. bed level exercises only.)                Transfers                    Ambulation/Gait                 Stairs             Wheelchair Mobility    Modified Rankin (Stroke Patients Only)       Balance Overall balance assessment: (pt declined OOB mobility)                                          Cognition Arousal/Alertness: Awake/alert Behavior During Therapy: Anxious Overall Cognitive Status: Within Functional Limits for tasks assessed                                 General Comments: Pt very anxious during session, declined OOB mobility due to concern for breathing. Educated on importance of mobility for breathing, pt verbalized agreement, continues to decline OOB mobility at this time.      Exercises General Exercises - Lower Extremity Ankle Circles/Pumps: AROM;Both;20 reps;Supine Quad  Sets: AROM;Both;10 reps;Supine Heel Slides: AROM;Both;10 reps;Supine Hip ABduction/ADduction: AROM;Both;10 reps;Supine    General Comments General comments (skin integrity, edema, etc.): Pt with good SpO2 maintenence with bed-level exercises. Needed one rest break from bed-exercises, SpO2 >94% through full session. RR 23-35      Pertinent Vitals/Pain Pain Assessment: Faces Faces Pain Scale: Hurts a little bit Pain Location: R chest wall Pain Descriptors / Indicators: Grimacing;Guarding Pain Intervention(s): Limited activity within patient's tolerance;Monitored during session    Home Living                      Prior Function            PT  Goals (current goals can now be found in the care plan section) Acute Rehab PT Goals Patient Stated Goal: to go home PT Goal Formulation: With patient Time For Goal Achievement: 12/16/19 Potential to Achieve Goals: Fair Progress towards PT goals: Not progressing toward goals - comment(limited by fatigue and anxiety, declining OOB)    Frequency    Min 3X/week      PT Plan Current plan remains appropriate    Co-evaluation              AM-PAC PT "6 Clicks" Mobility   Outcome Measure  Help needed turning from your back to your side while in a flat bed without using bedrails?: A Little Help needed moving from lying on your back to sitting on the side of a flat bed without using bedrails?: A Lot Help needed moving to and from a bed to a chair (including a wheelchair)?: A Lot Help needed standing up from a chair using your arms (e.g., wheelchair or bedside chair)?: A Lot Help needed to walk in hospital room?: A Lot Help needed climbing 3-5 steps with a railing? : Total 6 Click Score: 12    End of Session Equipment Utilized During Treatment: Oxygen Activity Tolerance: Patient limited by fatigue Patient left: in bed;with call bell/phone within reach Nurse Communication: Mobility status(pt declining OOB mobility) PT Visit Diagnosis: Unsteadiness on feet (R26.81);Muscle weakness (generalized) (M62.81)     Time: 0258-5277 PT Time Calculation (min) (ACUTE ONLY): 26 min  Charges:  $Therapeutic Exercise: 23-37 mins                     Karma Ganja, PT, DPT   Acute Rehabilitation Department Pager #: [phone]   Otho Bellows 12/08/2019, 3:38 PM

## 2019-12-08 NOTE — Progress Notes (Signed)
[facility] for Infectious Disease  Date of Admission:  11/11/2019     Total days of antibiotics 15 Day 12 of Cefazolin           ASSESSMENT:  Nathaniel Webb seems to be improving with treatment of his MSSA bacteremia secondary to R sided loculated effusion and pneumothorax (CT remains for this).   Pending IR evaluation for pigtail catheter for loculated collection described on non contrasted CT (?abscess vs hematoma). Would presume hematoma given he seems to be improving from standpoint of treating infection and blood loss anemia requiring transfusions of late; LMWH on hold and pending consideration for IVC filter with PE.  Would continue with Cefazolin for now through February 10th tentatively.   Will follow intermittently for changes in condition. CVTS and PCCM following for     PLAN:  1. Continue current dose of Cefazolin through 12/24/19 2. PE and chest tube management per critical care/primary team. 3. Follow up IR recommendations to drain new RLL collection (likely hematoma) - would send culture for aerobic/anaerobic studies.      Principal Problem:   Acute respiratory failure with hypoxemia (HCC) Active Problems:   Loculated pleural effusion   MSSA bacteremia   History of COVID-19 pneumonia   Type 2 diabetes, controlled, with peripheral neuropathy (HCC)   Hypertension   Morbid obesity (HCC)   Diabetes mellitus type 2, uncontrolled, with complications ([place])   Acute pulmonary embolus ([place])   Demand ischemia (HCC)   Constipation   Empyema lung (HCC)   Hydropneumothorax   ARDS (adult respiratory distress syndrome) (HCC)   Pneumonia of right lung due to methicillin susceptible Staphylococcus aureus (MSSA) ([place])   Chest tube in place   . vitamin C  500 mg Oral Daily  . aspirin EC  81 mg Oral Daily  . atorvastatin  10 mg Oral QPM  . chlorhexidine  15 mL Mouth/Throat BID  . Chlorhexidine Gluconate Cloth  6 each Topical Daily  . chlorpheniramine-HYDROcodone  5  mL Oral Q12H  . docusate sodium  200 mg Oral BID  . feeding supplement (ENSURE ENLIVE)  237 mL Oral TID BM  . fluticasone  2 spray Each Nare BID  . insulin aspart  0-20 Units Subcutaneous TID WC  . insulin detemir  18 Units Subcutaneous Daily  . mouth rinse  15 mL Mouth Rinse BID  . Melatonin  3 mg Oral QHS  . metoprolol tartrate  50 mg Oral BID  . [START ON 12/09/2019] polyethylene glycol  17 g Oral Daily  . psyllium  1 packet Oral Daily  . senna-docusate  3 tablet Oral BID  . sodium chloride flush  10-40 mL Intracatheter Q12H  . sodium chloride flush  3 mL Intravenous Q12H  . zinc sulfate  220 mg Oral Daily    SUBJECTIVE:  Only complaint today is abdominal pain due to constipation/distended and being dry. The belly bloating has caused him to have decreased appetite. He has been afebrile and WBC trending down steeply. No side effects to current antibiotic.   No Known Allergies   Review of Systems: Review of Systems  Constitutional: Negative for chills, fever and weight loss.  Respiratory: Positive for cough, sputum production and wheezing. Negative for shortness of breath.   Cardiovascular: Negative for chest pain and leg swelling.  Gastrointestinal: Positive for constipation. Negative for abdominal pain, diarrhea, nausea and vomiting.  Genitourinary: Negative for dysuria and flank pain.  Musculoskeletal: Negative for back pain and joint pain.  Skin: Negative  for rash.      OBJECTIVE: Vitals:   12/08/19 0400 12/08/19 0759 12/08/19 0800 12/08/19 1157  BP: 124/74 120/72 123/73 130/80  Pulse: 85 89 88 82  Resp: (!) 23 (!) 22 (!) 35 (!) 29  Temp: 98 F (36.7 C) 97.8 F (36.6 C)  97.9 F (36.6 C)  TempSrc: Oral Oral  Oral  SpO2: 100% 99% 98% 100%  Weight:      Height:       Body mass index is 33.64 kg/m.  Physical Exam Vitals reviewed.  Constitutional:      General: He is not in acute distress.    Appearance: He is well-developed.     Comments: Resting in bed.  Breathing comfortably on 6 L [place]  HENT:     Mouth/Throat:     Mouth: Mucous membranes are moist.     Pharynx: Oropharynx is clear.  Cardiovascular:     Rate and Rhythm: Normal rate and regular rhythm.     Heart sounds: Normal heart sounds. No murmur.     Comments: Chest tube in place with air leak present.  Pulmonary:     Effort: Pulmonary effort is normal.     Breath sounds: Examination of the right-lower field reveals decreased breath sounds. Decreased breath sounds and wheezing present.     Comments: + 3-4/7 chamber leak on 30cm suction  Abdominal:     General: Bowel sounds are normal. There is distension.     Tenderness: There is no abdominal tenderness.  Skin:    General: Skin is warm and dry.  Neurological:     Mental Status: He is alert and oriented to person, place, and time.     Lab Results Lab Results  Component Value Date   WBC 16.4 (H) 12/08/2019   HGB 7.3 (L) 12/08/2019   HCT 22.5 (L) 12/08/2019   MCV 85.2 12/08/2019   PLT 241 12/08/2019    Lab Results  Component Value Date   CREATININE 1.94 (H) 12/08/2019   BUN 48 (H) 12/08/2019   NA 139 12/08/2019   K 3.8 12/08/2019   CL 100 12/08/2019   CO2 26 12/08/2019    Lab Results  Component Value Date   ALT 133 (H) 12/03/2019   AST 73 (H) 12/03/2019   ALKPHOS 159 (H) 12/03/2019   BILITOT 0.5 12/03/2019     Microbiology: Recent Results (from the past 240 hour(s))  MRSA PCR Screening     Status: None   Collection Time: 12/05/19  9:17 AM   Specimen: Nasal Mucosa; Nasopharyngeal  Result Value Ref Range Status   MRSA by PCR NEGATIVE NEGATIVE Final    Comment:        The GeneXpert MRSA Assay (FDA approved for NASAL specimens only), is one component of a comprehensive MRSA colonization surveillance program. It is not intended to diagnose MRSA infection nor to guide or monitor treatment for MRSA infections. Performed at [facility], [address] [place], [place] [address]      [name],  NP [facility] [phone] Pager  12/08/2019  12:04 PM

## 2019-12-08 NOTE — Progress Notes (Signed)
Bilateral lower extremity venous duplex completed. Refer to "CV Proc" under chart review to view preliminary results.  12/08/2019 11:49 AM Eula Fried., MHA, RVT, RDCS, RDMS

## 2019-12-08 NOTE — Progress Notes (Signed)
Inpatient Rehabilitation-Admissions Coordinator   Pt not medically ready CIR, thus today's assessment deferred. Note possible plan for VATS in the future.   AC will continue to follow and assess pt's candidacy for CIR once medically ready.   Cheri Rous, OTR/L  Rehab Admissions Coordinator  [phone] 12/08/2019 2:59 PM

## 2019-12-08 NOTE — Progress Notes (Signed)
Updated patients wife with patients permission via phone.  Versie Starks, RN

## 2019-12-09 ENCOUNTER — Inpatient Hospital Stay (HOSPITAL_COMMUNITY): Payer: Medicaid Other

## 2019-12-09 LAB — CBC
HCT: 22.6 % — ABNORMAL LOW (ref 39.0–52.0)
Hemoglobin: 7.1 g/dL — ABNORMAL LOW (ref 13.0–17.0)
MCH: 27.8 pg (ref 26.0–34.0)
MCHC: 31.4 g/dL (ref 30.0–36.0)
MCV: 88.6 fL (ref 80.0–100.0)
Platelets: 266 10*3/uL (ref 150–400)
RBC: 2.55 MIL/uL — ABNORMAL LOW (ref 4.22–5.81)
RDW: 17.8 % — ABNORMAL HIGH (ref 11.5–15.5)
WBC: 14 10*3/uL — ABNORMAL HIGH (ref 4.0–10.5)
nRBC: 0.6 % — ABNORMAL HIGH (ref 0.0–0.2)

## 2019-12-09 LAB — GLUCOSE, CAPILLARY
Glucose-Capillary: 86 mg/dL (ref 70–99)
Glucose-Capillary: 147 mg/dL — ABNORMAL HIGH (ref 70–99)
Glucose-Capillary: 108 mg/dL — ABNORMAL HIGH (ref 70–99)
Glucose-Capillary: 147 mg/dL — ABNORMAL HIGH (ref 70–99)

## 2019-12-09 LAB — BASIC METABOLIC PANEL
Anion gap: 10 (ref 5–15)
BUN: 32 mg/dL — ABNORMAL HIGH (ref 6–20)
CO2: 24 mmol/L (ref 22–32)
Calcium: 7 mg/dL — ABNORMAL LOW (ref 8.9–10.3)
Chloride: 103 mmol/L (ref 98–111)
Creatinine, Ser: 1.55 mg/dL — ABNORMAL HIGH (ref 0.61–1.24)
GFR calc Af Amer: 59 mL/min — ABNORMAL LOW (ref >60)
GFR calc non Af Amer: 51 mL/min — ABNORMAL LOW (ref >60)
Glucose, Bld: 93 mg/dL (ref 70–99)
Potassium: 3.7 mmol/L (ref 3.5–5.1)
Sodium: 137 mmol/L (ref 135–145)

## 2019-12-09 MED ORDER — SALINE SPRAY 0.65 % NA SOLN
2.0000 | Freq: Four times a day (QID) | NASAL | Status: DC
  Administered 2019-12-09 – 2019-12-17 (×28): 2 via NASAL
  Filled 2019-12-09: qty 44

## 2019-12-09 MED ORDER — IPRATROPIUM-ALBUTEROL 0.5-2.5 (3) MG/3ML IN SOLN
3.0000 mL | Freq: Three times a day (TID) | RESPIRATORY_TRACT | Status: DC
  Administered 2019-12-10 – 2019-12-17 (×21): 3 mL via RESPIRATORY_TRACT
  Filled 2019-12-09 (×19): qty 3

## 2019-12-09 MED ORDER — IPRATROPIUM-ALBUTEROL 0.5-2.5 (3) MG/3ML IN SOLN
3.0000 mL | Freq: Four times a day (QID) | RESPIRATORY_TRACT | Status: DC
  Administered 2019-12-09 (×2): 3 mL via RESPIRATORY_TRACT
  Filled 2019-12-09: qty 3

## 2019-12-09 NOTE — Progress Notes (Signed)
ANTICOAGULATION CONSULT NOTE - Follow Up Consult  Pharmacy Consult for Lovenox Indication: pulmonary embolus  Labs: Recent Labs    12/06/19 1858 12/06/19 1858 12/07/19 0305 12/07/19 0305 12/08/19 0249 12/09/19 0500  HGB 8.3*   < > 8.0*   < > 7.3* 7.1*  HCT 24.3*   < > 23.3*  --  22.5* 22.6*  PLT 246   < > 255  --  241 266  HEPRLOWMOCWT >2.00  --  1.55  --  0.26  --   CREATININE 3.11*   < > 2.75*  --  1.94* 1.55*   < > = values in this interval not displayed.    Assessment:  54 yo M with 01/11/19 acute PE with many changes to anticoagulation due to procedures, 1/21 for chest hematoma and hematuria, 1/24 likely new RLLobe hematoma: 12/29- 1/8 enoxaparin   1/4 - 1/7  Warfarin 1/10-1/11 apixaban  1/12 -1/16 heparin gtt 1/16- 1/23 enoxaparin 1/25         Enoxaparin  1/25 LE doppler negative for DVT. H/H downtrending as expected. Patient still has chest tubes in and will eventually need a VATS when less debilitated. Teams also considering IVC filter.   Plan: Hold enoxaparin per CCM F/u bleeding, plans for anticoagulation vs IVC filter    Alphia Moh, PharmD, BCPS, [facility] Clinical Pharmacist  Please check AMION for all [facility] Pharmacy phone numbers After 10:00 PM, call Main Pharmacy [phone]

## 2019-12-09 NOTE — Progress Notes (Addendum)
Occupational Therapy Treatment Patient Details Name: Nathaniel Webb MRN: [id number] DOB: 1966-01-27 Today's Date: 12/09/2019    History of present illness 54 y/o male w/ hx of Obesity, low HDL, HTN, DM, presented to [facility] (12/29) with approximately 1 week history of gradually worsening shortness of breath.  He was found to have acute hypoxic respiratory failure and was requiring high flow oxygen to maintain O2 saturations.  Chest x-ray was typical of Covid pneumonitis. Pt dx w/ COVID 12/23 and dc home w/ spouse. 1/10 CXR showed ARDS bilateral increasing opacification; respiratory status worsened; concerns for aspiration. Acute PE; loculated right pleural effusion; 1/11 CTS consult, recs chest tube. 1/12 CT chest with densely consolidated right lung, air fluid level/ hydropneumothorax. Thoracentesis 1/12; pleural fluid sampled and concerning for empyema.  1/13 emergently transferred to ICU for worsening hypoxia; CT showed worsening right hydropneumothorax with loculations in pleura. Emergent right chest tube placed. Pt intubated 11/30/19-12/01/19. Pt developed hematuria on 1/21.   OT comments  Pt making steady progress towards OT goals this session. Session focus on functional mobility as precursor to higher level ADLs. Pt continues to be limited by dyspnea with exertion and general anxiety with mobility. Pt completed stand pivot transfer from EOB>recliner with MIN A +2 for safety with line management. Pt on 5L HFNC with sats decreasing to 79% with RR as much as 44. Pt able to increase O2 to WNL with cues to incorporate PLB during seated rest break. Continue to recommend CIR at time of DC, will follow acutely for OT needs.    Follow Up Recommendations  CIR    Equipment Recommendations  None recommended by OT    Recommendations for Other Services      Precautions / Restrictions Precautions Precautions: Fall;Other (comment) Precaution Comments: R sided Chest tube, monitor SpO2 Restrictions Weight  Bearing Restrictions: No       Mobility Bed Mobility Overal bed mobility: Needs Assistance Bed Mobility: Supine to Sit     Supine to sit: Min guard;HOB elevated     General bed mobility comments: increased time and effort, needed a break after bed mobility to slow RR and catch O2 sats back up  Transfers Overall transfer level: Needs assistance Equipment used: Rolling walker (2 wheeled) Transfers: Sit to/from Stand Sit to Stand: Min assist Stand pivot transfers: Min assist       General transfer comment: MinA to boost to upright, then MinA to take pivotal steps to recliner- really needed assist during transfer more for line management than balance or stability    Balance Overall balance assessment: Needs assistance Sitting-balance support: Bilateral upper extremity supported;Feet supported Sitting balance-Leahy Scale: Good Sitting balance - Comments: S for safety   Standing balance support: Bilateral upper extremity supported Standing balance-Leahy Scale: Poor Standing balance comment: reliant on bil. UE support                            ADL either performed or assessed with clinical judgement   ADL Overall ADL's : Needs assistance/impaired                         Toilet Transfer: Minimal assistance;+2 for safety/equipment;Stand-pivot;RW Toilet Transfer Details (indicate cue type and reason): simulated via stand pivot from EOB>recliner with RW. pt limited by dyspnea with exertion needing cues to slow breathing as RR increase to as much as 44         Functional mobility during  ADLs: Minimal assistance;+2 for safety/equipment General ADL Comments: session focus on functional mobility from EOB>recliner with RW needing MIN A +2 for safety. Pt anxious with mobility needing cues to slow breathing as pt RR increase to as much as 44.     Vision       Perception     Praxis      Cognition Arousal/Alertness: Awake/alert Behavior During Therapy:  Anxious Overall Cognitive Status: Within Functional Limits for tasks assessed                                 General Comments: continues to be anxious during session, often needed cues for slowing breathing to reduce RR        Exercises     Shoulder Instructions       General Comments O2 down to 79% at one point on HFNC 5LPM with RR as much as 47-49; for most of sessino, when we were able to get him to slow RR down to high 20s/mid 30s, able to maintain sats in high 80s/low 90s    Pertinent Vitals/ Pain       Pain Assessment: Faces Faces Pain Scale: Hurts a little bit Pain Location: R chest wall Pain Descriptors / Indicators: Grimacing;Guarding Pain Intervention(s): Limited activity within patient's tolerance;Monitored during session;Repositioned  Home Living                                          Prior Functioning/Environment              Frequency  Min 3X/week        Progress Toward Goals  OT Goals(current goals can now be found in the care plan section)  Progress towards OT goals: Progressing toward goals  Acute Rehab OT Goals Patient Stated Goal: to go home Time For Goal Achievement: 12/17/19 Potential to Achieve Goals: Good  Plan Discharge plan remains appropriate    Co-evaluation      Reason for Co-Treatment: For patient/therapist safety;To address functional/ADL transfers;Other (comment)(tried co-treat to assist in motivating patient to get OOB as he has been refusing for some time)          AM-PAC OT "6 Clicks" Daily Activity     Outcome Measure   Help from another person eating meals?: A Little Help from another person taking care of personal grooming?: A Little Help from another person toileting, which includes using toliet, bedpan, or urinal?: A Little Help from another person bathing (including washing, rinsing, drying)?: A Lot Help from another person to put on and taking off regular upper body  clothing?: A Little Help from another person to put on and taking off regular lower body clothing?: A Lot 6 Click Score: 16    End of Session Equipment Utilized During Treatment: Rolling walker;Oxygen;Other (comment)(5L HFNC)  OT Visit Diagnosis: Unsteadiness on feet (R26.81);Muscle weakness (generalized) (M62.81)   Activity Tolerance Other (comment)(pt limited by anxiety with movement)   Patient Left in bed;with call bell/phone within reach;with chair alarm set   Nurse Communication Mobility status        Time: 7824-2353 OT Time Calculation (min): 23 min  Charges: OT General Charges $OT Visit: 1 Visit OT Treatments $Therapeutic Activity: 8-22 mins  Lanier Clam., COTA/L Acute Rehabilitation Services [phone] [place] 12/09/2019, 3:21 PM

## 2019-12-09 NOTE — Progress Notes (Signed)
    [facility]   Reason for visit: Follow up on MSSA bacteremia  Interval History: feels sob, anxious.  Dry mouth and nose.  Afebrile.  WBC down to 14.      Physical Exam: Constitutional:  Vitals:   12/09/19 0532 12/09/19 0844  BP:  132/81  Pulse: 82 98  Resp: (!) 30 16  Temp:  98 F (36.7 C)  SpO2: 97%    patient is in some respiratory distress Respiratory: + wheezes  Cardiovascular: RRR GI: soft, nt, nd  Review of Systems: Constitutional: negative for fevers and chills Gastrointestinal: negative for diarrhea  Lab Results  Component Value Date   WBC 14.0 (H) 12/09/2019   HGB 7.1 (L) 12/09/2019   HCT 22.6 (L) 12/09/2019   MCV 88.6 12/09/2019   PLT 266 12/09/2019    Lab Results  Component Value Date   CREATININE 1.55 (H) 12/09/2019   BUN 32 (H) 12/09/2019   NA 137 12/09/2019   K 3.7 12/09/2019   CL 103 12/09/2019   CO2 24 12/09/2019    Lab Results  Component Value Date   ALT 133 (H) 12/03/2019   AST 73 (H) 12/03/2019   ALKPHOS 159 (H) 12/03/2019     Microbiology: Recent Results (from the past 240 hour(s))  MRSA PCR Screening     Status: None   Collection Time: 12/05/19  9:17 AM   Specimen: Nasal Mucosa; Nasopharyngeal  Result Value Ref Range Status   MRSA by PCR NEGATIVE NEGATIVE Final    Comment:        The GeneXpert MRSA Assay (FDA approved for NASAL specimens only), is one component of a comprehensive MRSA colonization surveillance program. It is not intended to diagnose MRSA infection nor to guide or monitor treatment for MRSA infections. Performed at [facility], [address] [place], [place] [address]     Impression/Plan:  1. MSSA bacteremia - continues on cefazolin.  Will continue this through 2/10.    2.  Pulmonary empyema - continues with chest tube and air leak.  Will need VATS at some point.

## 2019-12-09 NOTE — Progress Notes (Addendum)
Patient bathed and linen changed.Patient stated he was able to clean his nose out and could breathe better. Patient returned to bed after bath and stated he felt much better. Patient initially sat on side of bed to recover breathing from move. Patient educated on relaxation exercises to limit anxiety with movement. Patient oxygen saturations maintained greater than 85 % after sitting and relaxing. HFNC @ 5 L with sats 88 to 100%. Pt resting with call bell within reach.  Will continue to monitor.

## 2019-12-09 NOTE — Progress Notes (Signed)
Physical Therapy Treatment Patient Details Name: Nathaniel Webb MRN: [id number] DOB: April 24, 1966 Today's Date: 12/09/2019    History of Present Illness 54 y/o male w/ hx of Obesity, low HDL, HTN, DM, presented to [facility] (12/29) with approximately 1 week history of gradually worsening shortness of breath.  He was found to have acute hypoxic respiratory failure and was requiring high flow oxygen to maintain O2 saturations.  Chest x-ray was typical of Covid pneumonitis. Pt dx w/ COVID 12/23 and dc home w/ spouse. 1/10 CXR showed ARDS bilateral increasing opacification; respiratory status worsened; concerns for aspiration. Acute PE; loculated right pleural effusion; 1/11 CTS consult, recs chest tube. 1/12 CT chest with densely consolidated right lung, air fluid level/ hydropneumothorax. Thoracentesis 1/12; pleural fluid sampled and concerning for empyema.  1/13 emergently transferred to ICU for worsening hypoxia; CT showed worsening right hydropneumothorax with loculations in pleura. Emergent right chest tube placed. Pt intubated 11/30/19-12/01/19. Pt developed hematuria on 1/21.    PT Comments    Patient received in bed, reports he is willing to try OOB to chair today. Able to complete mobility with levels of assist as below. Required frequent rest breaks and cuing to reduce RR/assist in maintaining sats- seems like an anxious personality at baseline, and he has been quite anxious about mobility for the past few sessions which also can cause his RR to increase. Did a great job transferring but unable to progress to gait due to significant increase in RR/WOB once in chair. Able to slow RR to 28-33, improved SpO2 to 88-92% on 5LPM HFNC at EOS. He was left up in the chair and encouraged to use the IS regularly. Continue to recommend CIR due to need for intensive therapies to regain independence.     Follow Up Recommendations  CIR     Equipment Recommendations  Rolling walker with 5" wheels;3in1 (PT);Other  (comment)    Recommendations for Other Services       Precautions / Restrictions Precautions Precautions: Fall;Other (comment) Precaution Comments: R sided Chest tube, monitor SpO2 Restrictions Weight Bearing Restrictions: No    Mobility  Bed Mobility Overal bed mobility: Needs Assistance Bed Mobility: Supine to Sit     Supine to sit: Min guard;HOB elevated     General bed mobility comments: increased time and effort, needed a break after bed mobility to slow RR and catch O2 sats back up  Transfers Overall transfer level: Needs assistance Equipment used: Rolling walker (2 wheeled) Transfers: Sit to/from Stand Sit to Stand: Min assist Stand pivot transfers: Min assist       General transfer comment: MinA to boost to upright, then MinA to take pivotal steps to recliner- really needed assist during transfer more for line management than balance or stability  Ambulation/Gait             General Gait Details: deferred- increased WOB and fatigue   Stairs             Wheelchair Mobility    Modified Rankin (Stroke Patients Only)       Balance Overall balance assessment: Needs assistance Sitting-balance support: Bilateral upper extremity supported;Feet supported Sitting balance-Leahy Scale: Good Sitting balance - Comments: S for safety   Standing balance support: Bilateral upper extremity supported Standing balance-Leahy Scale: Poor Standing balance comment: reliant on bil. UE support                             Cognition Arousal/Alertness: Awake/alert  Behavior During Therapy: Anxious Overall Cognitive Status: Within Functional Limits for tasks assessed                                 General Comments: continues to be anxious during session, often needed cues for slowing breathing to reduce RR      Exercises      General Comments General comments (skin integrity, edema, etc.): O2 down to 79% at one point on HFNC 5LPM  with RR as much as 47-49; for most of sessino, when we were able to get him to slow RR down to high 20s/mid 30s, able to maintain sats in high 80s/low 90s      Pertinent Vitals/Pain Pain Assessment: Faces Faces Pain Scale: Hurts a little bit Pain Location: R chest wall Pain Descriptors / Indicators: Grimacing;Guarding Pain Intervention(s): Limited activity within patient's tolerance;Monitored during session    Home Living                      Prior Function            PT Goals (current goals can now be found in the care plan section) Acute Rehab PT Goals Patient Stated Goal: to go home PT Goal Formulation: With patient Time For Goal Achievement: 12/16/19 Potential to Achieve Goals: Fair Progress towards PT goals: Progressing toward goals    Frequency    Min 3X/week      PT Plan Current plan remains appropriate    Co-evaluation PT/OT/SLP Co-Evaluation/Treatment: Yes Reason for Co-Treatment: For patient/therapist safety;To address functional/ADL transfers;Other (comment)(tried co-treat to assist in motivating patient to get OOB as he has been refusing for some time)          AM-PAC PT "6 Clicks" Mobility   Outcome Measure  Help needed turning from your back to your side while in a flat bed without using bedrails?: A Little Help needed moving from lying on your back to sitting on the side of a flat bed without using bedrails?: A Little Help needed moving to and from a bed to a chair (including a wheelchair)?: A Little Help needed standing up from a chair using your arms (e.g., wheelchair or bedside chair)?: A Little Help needed to walk in hospital room?: A Lot Help needed climbing 3-5 steps with a railing? : Total 6 Click Score: 15    End of Session Equipment Utilized During Treatment: Oxygen Activity Tolerance: Patient limited by fatigue Patient left: in chair;with call bell/phone within reach Nurse Communication: Mobility status PT Visit Diagnosis:  Unsteadiness on feet (R26.81);Muscle weakness (generalized) (M62.81)     Time: 7989-2119 PT Time Calculation (min) (ACUTE ONLY): 23 min  Charges:  $Therapeutic Activity: 8-22 mins                     Windell Norfolk, DPT, PN1   Supplemental Physical Therapist [place]    Pager [phone] Acute Rehab Office [phone]

## 2019-12-09 NOTE — Progress Notes (Signed)
  Subjective: MSSA sepsis cont to improve with iv Ancef Because he is too weak to walk , has increasing O2 needs currently on Hi flow O2 5L and his CT scan shows persistent L lung inflammation from Covid he does not meet criteria for R VATS to treat empyema ,space, and air leak at this time Will follow  Objective: Vital signs in last 24 hours: Temp:  [97.3 F (36.3 C)-98.4 F (36.9 C)] 98 F (36.7 C) (01/26 0844) Pulse Rate:  [74-98] 86 (01/26 0900) Cardiac Rhythm: Normal sinus rhythm (01/26 0700) Resp:  [16-30] 26 (01/26 0900) BP: (107-132)/(69-81) 132/81 (01/26 0900) SpO2:  [92 %-100 %] 92 % (01/26 1023)  Hemodynamic parameters for last 24 hours:    Intake/Output from previous day: 01/25 0701 - 01/26 0700 In: 2389.9 [P.O.:720; I.V.:1369.9; IV Piggyback:300] Out: 2102 [Urine:1800; Chest Tube:302] Intake/Output this shift: No intake/output data recorded. EXAM rhonchi , wheezes bilaterally Large air leak R Chest tube secure  Lab Results: Recent Labs    12/08/19 0249 12/09/19 0500  WBC 16.4* 14.0*  HGB 7.3* 7.1*  HCT 22.5* 22.6*  PLT 241 266   BMET:  Recent Labs    12/08/19 0249 12/09/19 0500  NA 139 137  K 3.8 3.7  CL 100 103  CO2 26 24  GLUCOSE 117* 93  BUN 48* 32*  CREATININE 1.94* 1.55*  CALCIUM 7.2* 7.0*    PT/INR: No results for input(s): LABPROT, INR in the last 72 hours. ABG    Component Value Date/Time   PHART 7.446 11/30/2019 0551   HCO3 30.8 (H) 11/30/2019 0551   TCO2 32 11/30/2019 0551   ACIDBASEDEF 4.1 (H) 11/09/2019 2210   O2SAT 98.0 11/30/2019 0551   CBG (last 3)  Recent Labs    12/08/19 2109 12/09/19 0615 12/09/19 1107  GLUCAP 92 86 147*    Assessment/Plan: S/P Covid pneumonia RLL MSSA pneumonia with empyema Cont current medical care    LOS: 28 days    Kathlee Nations Trigt III 12/09/2019

## 2019-12-09 NOTE — Progress Notes (Signed)
PULMONARY / CRITICAL CARE MEDICINE   NAME:  Nathaniel Webb, MRN:  [id number], DOB:  28-Aug-1966, LOS: 2 ADMISSION DATE:  11/11/2019, CONSULTATION DATE:  1/12 REFERRING MD:  Nathaniel Webb, CHIEF COMPLAINT:  Pleural effusion   BRIEF HISTORY:    [name] is a 54 y/o male, with a PMH appreciated below, admitted with COVID 19 pneumonia. Subsequently developed a loculated pleural effusion by 1/10 with associated hydropneumothorax.  SIGNIFICANT PAST MEDICAL HISTORY    Past Medical History:  Diagnosis Date  . Diabetes mellitus without complication ([place])   . Heel pain 06/09/2015  . High triglycerides 05/09/2016  . Hypertension   . Low HDL (under 40) 05/09/2016  . Obesity, Class II, BMI 35-39.9, with comorbidity ([place]) 01/04/2016     SIGNIFICANT EVENTS:  12/29: Admitted 1/12: PCCM consulted with performance of a thoracentesis 1/13 Tx to ICU with worsening pneumothorax 1/17 intubated 1/18 Continue to have R sided pneumothorax despite chest tube. 28 french placed and extubated that evening 1/19 small bore ct removed, getting lasix. Feeling better and stronger. airleak still brisk 1/20 O2 down to 4L, still desaturating. 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 Improved overal aeration. CVTS> Continue current Rx. ALI Resolved might be VATS Candidate eventually.  1/21 hematuria>Holding LMWH with reassessment 1/22 1/22: PT ordered, patient encouraged ambulation. Start trending CBC again to follow leukocytosis. Possible ct repositioning pending CBC results.   STUDIES:   CTA Chest 12/29 >> positive for PE with small volume embolus in the distal right pulmonary artery, lobar, and some segmental branches CT ABD/Pelvis 1/10 >> moderate stool burden in colon, R>L airspace disease compatible with PNA, loculated right pleural effusion CT Chest w/o 1/10 >> multiloculated right lung hydropneumothorax, larger volume of pleural fluid relative to pleural  air, progression of multilobar right lung consolidation, small volume loculated right pneumomediastinum Thora1/12>> glucose <20, LD 2,447, protein 5.2, total nucleated cells 117,299, neutrophil 85 CT Chest w/o 1/13 >> limited exam by motion, large right hydropneumothorax with numerous thin internal septations with air fluid levels increased in size, complete right lung atelectasis, pneumomediastinum in the anterior right pericardial region, similar extensive patchy GGO on L 1/21 CT chest: IMPRESSION: 1. Decreased small to moderate right hydropneumothorax. 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 2. Improved aeration of the right lung with residual mild to moderate atelectasis. 3. Decreased pneumomediastinum in the anterior right pericardial region. 4. Extensive patchy ground-glass density and reticulation throughout the left and right lungs, improved from prior CT, remains consistent with COVID-19 pneumonia. 5. Aortic atherosclerosis 1/24 CT chest 1. Interval development of a 12 x 11 cm collection that appears to be centered within the right lower lobe parenchyma. This is suboptimally evaluated in the absence of IV contrast. Differential considerations include a lung abscess or hematoma or a combination of both. If there is clinical concern for active bleeding, a contrast enhanced CT of the chest timed as a PE study is recommended. 2. Persistent right-sided pneumothorax with a well-positioned right-sided chest tube. Overall, the size of the pneumothorax is relatively stable. 3. Small to moderate size right-sided pleural effusion. 4. Persistent diffuse bilateral airspace opacities similar to prior study. CULTURES:  1/10 Blood Culture: Staph Aureus. R Erythromycin, Clindamycin,  1/12 Body Pleural Fluid: Staph Aureus. R Erythromcyin, Clindamycin ANTIBIOTICS:  Remdesivir 12/27>1/1 Tocilizumab: 12/28> Cefepime: 1/10>>1/14 Linezolid  1/10>>1/14 Cefezolin 1/14>>  LINES/TUBES:  12/27 R Antecubital 1/18 Catheter  1/19 R Chest Tube 28 [place]  CONSULTANTS:  PCCM Cardiothoracic Surgery PMR  SUBJECTIVE:  Feeling better. Still having nasal congestion   CONSTITUTIONAL: Blood Pressure 132/81 (BP Location: Right Arm)   Pulse 98   Temperature 98 F (36.7 C) (Oral)   Respiration 16   Height 5\' 5"  (1.651 m)   Weight 91.7 kg   Oxygen Saturation 97%   Body Mass Index 33.64 kg/m   I/O last 3 completed shifts: In: 3953.8 [P.O.:1020; I.V.:2369.9; IV Piggyback:563.9] Out: 2922 [Urine:2500; Chest Tube:422]        PHYSICAL EXAM:  General this is a 54 year old male. Sitting up in bed. He is in no distress today. Still has exertional dyspnea  HENT NCAT. No JVD Pulm coarse scattered rhonchi w/ tactile frem on right. 7/7 blowing and turbulent airleak persists. Oxygen requirements 5 lpm  Card RRR abd not tender  Ext no sig edema  Neuro intact   RESOLVED PROBLEM LIST  Acute hypoxic respiratory failure 2/2 COVID PNA w/ ALI Hematuria  ASSESSMENT AND PLAN    MSSA Bacteremia/Empyema with Broncho Pulmonary Fistula:  Still has loculated pleural collection on left as well as new right basilar parenchymal fluid collection.  -Clinically fluid collection seems more likely hematoma than abscess as no fever and WBC ct improved-->nothing to do for this from surgical standpoint -pcxr w/ persistent loculated right effusion. I am doubtful at this point we would get much out of CT guided CT if we are going to require VATS to fix BPF probably best to just cont current rx.  -airleak->  Plan Cont CT to sxn Cont current abx Cont serial films Holding systemic AC Add SCDs Wean oxygen -->adding high flow salter to facilitate mobility.  Eventually needs VATS  Hematuria-->resolved.  -flomax started Plan Dc foley   Acute blood loss anemia  Has required several units of blood over last 3-4 days. hgb at lowest was 5.9 on 1/23.  As dropped about 1 gm over last 48 hrs. His hematoma at CT site has not changed much. He does have new parenchymal density which I am concerned is a hematoma. hgb stable for last 24 hrs Plan  Serial cbc  AKI with Associated Hyponatremia:  -No acute indications for dialysis, improved w/ IV hydration Plan Holding diuretics  Cont ns at 50/hr   Small Acute Pulmonary Embolism but no DVT noted on 2/23 1/25 - 12/29 PE appreciated in the Right distal pulmonary artery  Plan Supplemental oxygen No AC given pulm hematoma   Hypertension:  Plan Cont meds  Diabetes Mellitis:  Plan ssi and basal dosing   Constipation Plan Cont stool regimen   Nasal congestion Plan Nasal steroids  And scheduled saline rinse    Best Practice / Goals of Care / Disposition.   DVT PROPHYLAXIS: Lovenox SUP:NA NUTRITION:Dysphagia 3  MOBILITY: Bed rest GOALS OF CARE:FULL FAMILY DISCUSSIONS: Updated wife on cardiothoracic surgery's recommendations for VATS procedure.  DISPOSITION progressive care  04-06-1976 ACNP-BC [facility] Pulmonary/Critical Care Pager # [phone] OR # [phone] if no answer

## 2019-12-10 ENCOUNTER — Inpatient Hospital Stay (HOSPITAL_COMMUNITY): Payer: Medicaid Other

## 2019-12-10 LAB — CBC
HCT: 23.7 % — ABNORMAL LOW (ref 39.0–52.0)
Hemoglobin: 7.4 g/dL — ABNORMAL LOW (ref 13.0–17.0)
MCH: 27.6 pg (ref 26.0–34.0)
MCHC: 31.2 g/dL (ref 30.0–36.0)
MCV: 88.4 fL (ref 80.0–100.0)
Platelets: 289 10*3/uL (ref 150–400)
RBC: 2.68 MIL/uL — ABNORMAL LOW (ref 4.22–5.81)
RDW: 18.6 % — ABNORMAL HIGH (ref 11.5–15.5)
WBC: 12.6 10*3/uL — ABNORMAL HIGH (ref 4.0–10.5)
nRBC: 0.6 % — ABNORMAL HIGH (ref 0.0–0.2)

## 2019-12-10 LAB — BASIC METABOLIC PANEL
Anion gap: 10 (ref 5–15)
BUN: 21 mg/dL — ABNORMAL HIGH (ref 6–20)
CO2: 25 mmol/L (ref 22–32)
Calcium: 7.2 mg/dL — ABNORMAL LOW (ref 8.9–10.3)
Chloride: 104 mmol/L (ref 98–111)
Creatinine, Ser: 1.27 mg/dL — ABNORMAL HIGH (ref 0.61–1.24)
GFR calc Af Amer: >60 mL/min (ref >60)
GFR calc non Af Amer: >60 mL/min (ref >60)
Glucose, Bld: 96 mg/dL (ref 70–99)
Potassium: 4 mmol/L (ref 3.5–5.1)
Sodium: 139 mmol/L (ref 135–145)

## 2019-12-10 LAB — GLUCOSE, CAPILLARY
Glucose-Capillary: 90 mg/dL (ref 70–99)
Glucose-Capillary: 181 mg/dL — ABNORMAL HIGH (ref 70–99)
Glucose-Capillary: 88 mg/dL (ref 70–99)
Glucose-Capillary: 177 mg/dL — ABNORMAL HIGH (ref 70–99)

## 2019-12-10 LAB — CHOLESTEROL, BODY FLUID: Cholesterol, Fluid: 147 mg/dL

## 2019-12-10 LAB — PREALBUMIN: Prealbumin: 15.3 mg/dL — ABNORMAL LOW (ref 18–38)

## 2019-12-10 MED ORDER — ENOXAPARIN SODIUM 40 MG/0.4ML ~~LOC~~ SOLN
40.0000 mg | SUBCUTANEOUS | Status: DC
  Administered 2019-12-10: 40 mg via SUBCUTANEOUS
  Filled 2019-12-10 (×2): qty 0.4

## 2019-12-10 MED ORDER — FUROSEMIDE 10 MG/ML IJ SOLN
40.0000 mg | Freq: Once | INTRAMUSCULAR | Status: AC
  Administered 2019-12-10: 11:00:00 40 mg via INTRAVENOUS
  Filled 2019-12-10: qty 4

## 2019-12-10 NOTE — Progress Notes (Signed)
Physical Therapy Treatment Patient Details Name: Nathaniel Webb MRN: [id number] DOB: 07-Apr-1966 Today's Date: 12/10/2019    History of Present Illness 54 y/o male w/ hx of Obesity, low HDL, HTN, DM, presented to [facility] (12/29) with approximately 1 week history of gradually worsening shortness of breath.  He was found to have acute hypoxic respiratory failure and was requiring high flow oxygen to maintain O2 saturations.  Chest x-ray was typical of Covid pneumonitis. Pt dx w/ COVID 12/23 and dc home w/ spouse. 1/10 CXR showed ARDS bilateral increasing opacification; respiratory status worsened; concerns for aspiration. Acute PE; loculated right pleural effusion; 1/11 CTS consult, recs chest tube. 1/12 CT chest with densely consolidated right lung, air fluid level/ hydropneumothorax. Thoracentesis 1/12; pleural fluid sampled and concerning for empyema.  1/13 emergently transferred to ICU for worsening hypoxia; CT showed worsening right hydropneumothorax with loculations in pleura. Emergent right chest tube placed. Pt intubated 11/30/19-12/01/19. Pt developed hematuria on 1/21.    PT Comments    Patient received in bed, complaining of foot pain but willing to try therapy session today. Mobility levels as below. Required extended rest breaks after each bout of transitional mobility, as well as fairly constant cues for breathing technique to maintain appropriate RR (had one time high of 52 during session). Increased time and effort for all mobility. Performed calf and plantar fascia stretches to attempt to address foot pain and promote improved mobility prior to mobilizing this session. Able to ambulate approximately 61f to chair today with primary assistance for line management. Continues to have increased WOB and accessory muscle activation, education provided for deep breathing/pursed lip breathing. He was positioned to comfort in the chair with all needs met this morning, RN aware of patient status.      Follow Up Recommendations  CIR     Equipment Recommendations  Rolling walker with 5" wheels;3in1 (PT)    Recommendations for Other Services       Precautions / Restrictions Precautions Precautions: Fall;Other (comment) Precaution Comments: R sided Chest tube, monitor SpO2 and RR Restrictions Weight Bearing Restrictions: No    Mobility  Bed Mobility Overal bed mobility: Needs Assistance Bed Mobility: Supine to Sit     Supine to sit: Supervision;HOB elevated     General bed mobility comments: increased time/effort, S for safety and assist with line management; cues to slow RR/cues for pursed lip breathing  Transfers Overall transfer level: Needs assistance Equipment used: Rolling walker (2 wheeled) Transfers: Sit to/from Stand Sit to Stand: Min assist Stand pivot transfers: Min guard       General transfer comment: MinA to boost to full upright, then min guard to take steps to recliner wtih RW; most assist for line management rather than due to physical need  Ambulation/Gait Ambulation/Gait assistance: Min guard Gait Distance (Feet): 2 Feet Assistive device: Rolling walker (2 wheeled) Gait Pattern/deviations: Step-through pattern;Decreased step length - right;Decreased step length - left;Trunk flexed Gait velocity: decreased   General Gait Details: assist for line management, steady with RW   Stairs             Wheelchair Mobility    Modified Rankin (Stroke Patients Only)       Balance Overall balance assessment: Needs assistance Sitting-balance support: Bilateral upper extremity supported;Feet supported Sitting balance-Leahy Scale: Good Sitting balance - Comments: S for safety   Standing balance support: Bilateral upper extremity supported;During functional activity Standing balance-Leahy Scale: Fair Standing balance comment: reliant on bil. UE support  Cognition Arousal/Alertness: Awake/alert Behavior  During Therapy: Anxious Overall Cognitive Status: Within Functional Limits for tasks assessed                                 General Comments: continues to be anxious during session, often needed cues for slowing breathing to reduce RR      Exercises      General Comments General comments (skin integrity, edema, etc.): O2 no lower than mid-80s on 5LPM O2 on HFNC; RR with one time high of 52, needed extended rest breaks after positional changes with mobility and consistent cues to slow RR to appropriage range. Less congested today which helped with breathing technique.      Pertinent Vitals/Pain Pain Assessment: 0-10 Pain Score: 6  Pain Location: B feet Pain Descriptors / Indicators: Throbbing;Tender;Sore Pain Intervention(s): Limited activity within patient's tolerance;Monitored during session;Repositioned    Home Living                      Prior Function            PT Goals (current goals can now be found in the care plan section) Acute Rehab PT Goals Patient Stated Goal: to go home PT Goal Formulation: With patient Time For Goal Achievement: 12/16/19 Potential to Achieve Goals: Fair Progress towards PT goals: Progressing toward goals    Frequency    Min 3X/week      PT Plan Current plan remains appropriate    Co-evaluation PT/OT/SLP Co-Evaluation/Treatment: Yes Reason for Co-Treatment: To address functional/ADL transfers;Other (comment)(more likely to participate in meaningful activities with co-tx) PT goals addressed during session: Mobility/safety with mobility;Proper use of DME        AM-PAC PT "6 Clicks" Mobility   Outcome Measure  Help needed turning from your back to your side while in a flat bed without using bedrails?: A Little Help needed moving from lying on your back to sitting on the side of a flat bed without using bedrails?: A Little Help needed moving to and from a bed to a chair (including a wheelchair)?: A  Little Help needed standing up from a chair using your arms (e.g., wheelchair or bedside chair)?: A Little Help needed to walk in hospital room?: A Lot Help needed climbing 3-5 steps with a railing? : Total 6 Click Score: 15    End of Session Equipment Utilized During Treatment: Oxygen Activity Tolerance: Patient limited by fatigue Patient left: in chair;with call bell/phone within reach Nurse Communication: Mobility status PT Visit Diagnosis: Unsteadiness on feet (R26.81);Muscle weakness (generalized) (M62.81)     Time: 1610-9604 PT Time Calculation (min) (ACUTE ONLY): 39 min  Charges:  $Therapeutic Activity: 23-37 mins               Windell Norfolk, DPT, PN1   Supplemental Physical Therapist [place]    Pager [phone] Acute Rehab Office [phone]

## 2019-12-10 NOTE — Progress Notes (Signed)
PULMONARY / CRITICAL CARE MEDICINE   NAME:  Nathaniel Webb, MRN:  [id number], DOB:  [birth year], LOS: 29 ADMISSION DATE:  11/11/2019, CONSULTATION DATE:  1/12 REFERRING MD:  Joseph Art, CHIEF COMPLAINT:  Pleural effusion   BRIEF HISTORY:    [name] is a 54 y/o male, with a PMH appreciated below, admitted with COVID 19 pneumonia. Subsequently developed a loculated pleural effusion by 1/10 with associated hydropneumothorax.  SIGNIFICANT PAST MEDICAL HISTORY    Past Medical History:  Diagnosis Date  . Diabetes mellitus without complication (HCC)   . Heel pain 06/09/2015  . High triglycerides 05/09/2016  . Hypertension   . Low HDL (under 40) 05/09/2016  . Obesity, Class II, BMI 35-39.9, with comorbidity (HCC) 01/04/2016     SIGNIFICANT EVENTS:  12/29: Admitted 1/12: PCCM consulted with performance of a thoracentesis 1/13 Tx to ICU with worsening pneumothorax 1/17 intubated 1/18 Continue to have R sided pneumothorax despite chest tube. 28 french placed and extubated that evening 1/19 small bore ct removed, getting lasix. Feeling better and stronger. airleak still brisk 1/20 O2 down to 4L, still desaturating. 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 Improved overal aeration. CVTS> Continue current Rx. ALI Resolved might be VATS Candidate eventually.  1/21 hematuria>Holding LMWH with reassessment 1/22 1/22: PT ordered, patient encouraged ambulation. Start trending CBC again to follow leukocytosis. Possible ct repositioning pending CBC results.  1/25: Low molecular weight heparin discontinued altogether given intraparenchymal hematoma identified on 1/24 via CT scan  STUDIES:   CTA Chest 12/29 >> positive for PE with small volume embolus in the distal right pulmonary artery, lobar, and some segmental branches CT ABD/Pelvis 1/10 >> moderate stool burden in colon, R>L airspace disease compatible with PNA, loculated right pleural  effusion CT Chest w/o 1/10 >> multiloculated right lung hydropneumothorax, larger volume of pleural fluid relative to pleural air, progression of multilobar right lung consolidation, small volume loculated right pneumomediastinum Thora1/12>> glucose <20, LD 2,447, protein 5.2, total nucleated cells 117,299, neutrophil 85 CT Chest w/o 1/13 >> limited exam by motion, large right hydropneumothorax with numerous thin internal septations with air fluid levels increased in size, complete right lung atelectasis, pneumomediastinum in the anterior right pericardial region, similar extensive patchy GGO on L 1/21 CT chest: IMPRESSION: 1. Decreased small to moderate right hydropneumothorax. 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 2. Improved aeration of the right lung with residual mild to moderate atelectasis. 3. Decreased pneumomediastinum in the anterior right pericardial region. 4. Extensive patchy ground-glass density and reticulation throughout the left and right lungs, improved from prior CT, remains consistent with COVID-19 pneumonia. 5. Aortic atherosclerosis 1/24 CT chest 1. Interval development of a 12 x 11 cm collection that appears to be centered within the right lower lobe parenchyma. This is suboptimally evaluated in the absence of IV contrast. Differential considerations include a lung abscess or hematoma or a combination of both. If there is clinical concern for active bleeding, a contrast enhanced CT of the chest timed as a PE study is recommended. 2. Persistent right-sided pneumothorax with a well-positioned right-sided chest tube. Overall, the size of the pneumothorax is relatively stable. 3. Small to moderate size right-sided pleural effusion. 4. Persistent diffuse bilateral airspace opacities similar to prior study. CULTURES:  1/10 Blood Culture: Staph Aureus. R Erythromycin, Clindamycin,  1/12 Body Pleural Fluid: Staph Aureus. R  Erythromcyin, Clindamycin ANTIBIOTICS:  Remdesivir 12/27>1/1 Tocilizumab: 12/28> Cefepime: 1/10>>1/14 Linezolid 1/10>>1/14 Cefezolin 1/14>>  LINES/TUBES:  12/27 R Antecubital 1/18 Catheter  1/19 R Chest Tube 28 French   CONSULTANTS:  PCCM Cardiothoracic Surgery PMR  SUBJECTIVE:  Feeling better, nasal congestion better since changing oxygen delivery device and adding more nasal hygiene  CONSTITUTIONAL: Blood Pressure 123/85 (BP Location: Left Arm)   Pulse (Abnormal) 103   Temperature 98.1 F (36.7 C) (Oral)   Respiration (Abnormal) 31   Height 5\' 5"  (1.651 m)   Weight 91.7 kg   Oxygen Saturation 97%   Body Mass Index 33.64 kg/m   I/O last 3 completed shifts: In: 2458 [P.O.:950; I.V.:1000; IV Piggyback:400] Out: 2300 [Urine:2050; Chest Tube:250]        PHYSICAL EXAM:  General this is a 54 year old male patient currently sitting up in bed he is in no acute distress this morning HEENT normocephalic atraumatic poor dentition appreciated no JVD Pulmonary coarse scattered rhonchi, continues to have turbulent 7 out of 7 right chest tube airleak dressing is intact Current oxygen requirements are 5 L via high flow nasal cannula device Cardiac: Mildly tachycardic regular rate and rhythm without murmur rub or gallop Abdomen: Soft not tender no organomegaly Neuro: Awake oriented no focal deficits GU: Clear yellow urine, Foley catheter in place Extremities: Mild increased lower extremity edema palpable pulses brisk circulation  RESOLVED PROBLEM LIST  Acute hypoxic respiratory failure 2/2 COVID PNA w/ ALI Hematuria  ASSESSMENT AND PLAN    MSSA Bacteremia/Empyema with Broncho Pulmonary Fistula:  Still has loculated pleural collection on left as well as new right basilar parenchymal fluid collection.  -Clinically fluid collection seems more likely hematoma than abscess.  No fevers, white blood cell count continues to improve Airleak 7 out of 7 persists Portable chest  x-ray personally reviewed: Loculated right fluid collection stable, perhaps improved.  No obvious pneumothorax, aeration when comparing serial films actually improved  Plan Continuing chest tube to suction  Continuing Ancef, Plan stop date 2/10 A.m. chest x-ray No anticoagulation SCDs Wean oxygen as tolerated Ensure adequate bowel regimen Evaluating orthopantogram to further assess for potential infectious risk from the mouth He will eventually need VATS for surgical correction of BPF as well as to clear out the loculated right pleural space  Hematuria-->resolved.  -flomax started Plan DC Foley catheter  Acute blood loss anemia  Anticoagulation has been on hold now for several days.  Did require transfusion on 23rd Plan  Serial CBCs  AKI with Associated Hyponatremia:  -No acute indications for dialysis, improved w/ IV hydration, creatinine almost normalized Now developing lower extremity edema as of 1/27 Plan Normal saline lock Discontinue Foley catheter A.m. chemistry May need to consider one-time diuretic on 1/28 assuming creatinine continues to normalize   Small Acute Pulmonary Embolism but no DVT noted on [place] 1/25 - 12/29 PE appreciated in the Right distal pulmonary artery  Plan Supplemental oxygen No anticoagulation given intraparenchymal pulmonary hematoma   Hypertension:  Plan We will continue Lopressor 50 mg twice daily  Diabetes Mellitis:  Excellent glycemic control Plan Sliding scale and basal insulin to continue at current dosing Constipation Plan Stool regimen ordered  Nasal congestion Plan Continuing nasal saline rinse and nasal steroids   Best Practice / Goals of Care / Disposition.   DVT PROPHYLAXIS: Lovenox SUP:NA NUTRITION:Dysphagia 3  MOBILITY: Bed rest GOALS OF CARE:FULL FAMILY DISCUSSIONS: Updated wife on cardiothoracic surgery's recommendations for VATS procedure.  DISPOSITION progressive care, eventually CIR candidate, consult note  from 21st says chest tubes need to be out  Erick Colace ACNP-BC [facility] Pager # [phone] OR # [phone] if no answer

## 2019-12-10 NOTE — Progress Notes (Addendum)
Nutrition Follow up  DOCUMENTATION CODES:   Obesity unspecified  INTERVENTION:   RD recommends placement of Cortrak and initiation of EN. Patient refuses.    Obtain recent weight  Continue Ensure Enlive po TID, each supplement provides 350 kcal and 20 grams of protein  Continue Magic cup TID with meals, each supplement provides 290 kcal and 9 grams of protein  MVI daily   NUTRITION DIAGNOSIS:   Inadequate oral intake related to acute illness, catabolic illness, decreased appetite as evidenced by NPO status, meal completion < 50%.  Ongoing  GOAL:   Patient will meet greater than or equal to 90% of their needs   Not meeting   MONITOR:   PO intake, Supplement acceptance, Labs, Weight trends, Diet advancement  REASON FOR ASSESSMENT:   Consult, Ventilator Enteral/tube feeding initiation and management  ASSESSMENT:   54 yo male admitted with COVID 19 pneumonia, developed worsening pneumothorax requiring chest tube, brief intubation.   PMH includes DM, HTN, morbid obesity  12/29 Admit 01/13 Worsening pneumothorax, chest tube placed 01/17 Intubated 01/18 Extubated  RD working remotely.  Spoke with pt via phone. Reports appetite remains off/on depending on what they send for him to eat (if he doesn't order for himself). Meal completions charted as 0-75% for his last eight meals (31% average). Drinking one Ensure daily. Pt is now day 28 without proper nutrition.   Discussed with pt the increased need for kcal/protein given his recent COVID diagnosis and likely dry weight loss. Went over what a Cortrak is and why he would benefit from placement. Pt refuses, wants to try to eat more. States he will drink 3-4 Ensures daily and try to consume more of his meals.   Admission weight: 106.1 kg  Current weight: 91.7 kg (last taken 1/22)   I/O: -3,706 ml since 1/13 UOP: 1,200 ml x 24 hrs  Chest tubes: 150 ml x 24 hrs   Medications: 500 mg Vit C, colace, SS novolog, levemir,  senokot, metamucil, 220 mg zinc sulfate  Labs: CBG 86-147  Diet Order:   Diet Order            DIET DYS 3 Room service appropriate? Yes; Fluid consistency: Thin  Diet effective now              EDUCATION NEEDS:   Not appropriate for education at this time  Skin:  Skin Assessment: Reviewed RN Assessment  Last BM:  1/26  Height:   Ht Readings from Last 1 Encounters:  11/11/19 5\' 5"  (1.651 m)    Weight:   Wt Readings from Last 1 Encounters:  12/05/19 91.7 kg    Ideal Body Weight:  61.8 kg  BMI:  Body mass index is 33.64 kg/m.  Estimated Nutritional Needs:   Kcal:  2325-2780 kcals  Protein:  135-160 g  Fluid:  >/= 2.2 L  03-29-2006 RD, LDN Clinical Nutrition Pager # [phone]

## 2019-12-10 NOTE — Progress Notes (Signed)
Occupational Therapy Treatment Patient Details Name: Nathaniel Webb MRN: [id number] DOB: 05-Oct-1966 Today's Date: 12/10/2019    History of present illness 54 y/o male w/ hx of Obesity, low HDL, HTN, DM, presented to [facility] (12/29) with approximately 1 week history of gradually worsening shortness of breath.  He was found to have acute hypoxic respiratory failure and was requiring high flow oxygen to maintain O2 saturations.  Chest x-ray was typical of Covid pneumonitis. Pt dx w/ COVID 12/23 and dc home w/ spouse. 1/10 CXR showed ARDS bilateral increasing opacification; respiratory status worsened; concerns for aspiration. Acute PE; loculated right pleural effusion; 1/11 CTS consult, recs chest tube. 1/12 CT chest with densely consolidated right lung, air fluid level/ hydropneumothorax. Thoracentesis 1/12; pleural fluid sampled and concerning for empyema.  1/13 emergently transferred to ICU for worsening hypoxia; CT showed worsening right hydropneumothorax with loculations in pleura. Emergent right chest tube placed. Pt intubated 11/30/19-12/01/19. Pt developed hematuria on 1/21.   OT comments  Pt making slow steady progress toward stated goals. Pt on 5L HFNC throughout session, VSS. RR was up to 52 during session, pt with DOE and starts to become more anxious. Education given on pursed lip breathing prior to mobility as well as anxiety reducing strategies. Pt completing bed mobility at supervision level, needing increased rest break once EOB for pursed lip breathing cues. Pt then completed stand pivot transfer (O2 was at 86% at lowest with quick rebound) to recliner. He needed max encouragement and cues for ECS and anxiety reducing strategies. With transfer RR up to 52, needing increased cues and time to pursed lip breathe and use mental imagery to reduce anxiety. Continue to recommend CIR. Will continue to follow.    Follow Up Recommendations  CIR;Supervision/Assistance - 24 hour    Equipment  Recommendations  None recommended by OT    Recommendations for Other Services      Precautions / Restrictions Precautions Precautions: Fall;Other (comment) Precaution Comments: R sided Chest tube, monitor SpO2 and RR Restrictions Weight Bearing Restrictions: No       Mobility Bed Mobility Overal bed mobility: Needs Assistance Bed Mobility: Supine to Sit     Supine to sit: Supervision;HOB elevated     General bed mobility comments: increased time/effort, S for safety and assist with line management; cues to slow RR/cues for pursed lip breathing  Transfers Overall transfer level: Needs assistance Equipment used: Rolling walker (2 wheeled) Transfers: Sit to/from Stand Sit to Stand: Min assist Stand pivot transfers: Min guard       General transfer comment: MinA to boost to full upright, then min guard to take steps to recliner wtih RW; most assist for line management rather than due to physical need    Balance Overall balance assessment: Needs assistance Sitting-balance support: Bilateral upper extremity supported;Feet supported Sitting balance-Leahy Scale: Good Sitting balance - Comments: S for safety   Standing balance support: Bilateral upper extremity supported;During functional activity Standing balance-Leahy Scale: Fair Standing balance comment: reliant on bil. UE support                            ADL either performed or assessed with clinical judgement   ADL Overall ADL's : Needs assistance/impaired                         Toilet Transfer: Min guard;+2 for safety/equipment Toilet Transfer Details (indicate cue type and reason): simulated to recliner, pt  neding assist for lines due to anxiety of their location. Limited by DOE with RR up to 52         Functional mobility during ADLs: Min guard;+2 for safety/equipment General ADL Comments: session focused on functional mobility progression and ECS education     Vision Baseline  Vision/History: Wears glasses Wears Glasses: Reading only Patient Visual Report: No change from baseline     Perception     Praxis      Cognition Arousal/Alertness: Awake/alert Behavior During Therapy: Anxious Overall Cognitive Status: Within Functional Limits for tasks assessed                                 General Comments: continues to be anxious during session, often needed cues for slowing breathing to reduce RR        Exercises     Shoulder Instructions       General Comments      Pertinent Vitals/ Pain       Pain Assessment: 0-10 Pain Score: 6  Pain Location: B feet Pain Descriptors / Indicators: Throbbing;Tender;Sore Pain Intervention(s): Limited activity within patient's tolerance;Monitored during session;Repositioned  Home Living                                          Prior Functioning/Environment              Frequency  Min 3X/week        Progress Toward Goals  OT Goals(current goals can now be found in the care plan section)  Progress towards OT goals: Progressing toward goals  Acute Rehab OT Goals Patient Stated Goal: to go home Time For Goal Achievement: 12/17/19 Potential to Achieve Goals: Good  Plan Discharge plan remains appropriate    Co-evaluation    PT/OT/SLP Co-Evaluation/Treatment: Yes Reason for Co-Treatment: For patient/therapist safety;To address functional/ADL transfers PT goals addressed during session: Mobility/safety with mobility;Proper use of DME OT goals addressed during session: ADL's and self-care;Strengthening/ROM      AM-PAC OT "6 Clicks" Daily Activity     Outcome Measure   Help from another person eating meals?: A Little Help from another person taking care of personal grooming?: A Little Help from another person toileting, which includes using toliet, bedpan, or urinal?: A Little Help from another person bathing (including washing, rinsing, drying)?: A Lot Help from  another person to put on and taking off regular upper body clothing?: A Little Help from another person to put on and taking off regular lower body clothing?: A Lot 6 Click Score: 16    End of Session Equipment Utilized During Treatment: Rolling walker;Oxygen;Other (comment)(5l HFNC)  OT Visit Diagnosis: Unsteadiness on feet (R26.81);Muscle weakness (generalized) (M62.81)   Activity Tolerance Patient tolerated treatment well   Patient Left in chair;with call bell/phone within reach;with chair alarm set   Nurse Communication Mobility status        Time: 2130-8657 OT Time Calculation (min): 39 min  Charges: OT General Charges $OT Visit: 1 Visit OT Treatments $Self Care/Home Management : 8-22 mins  Dalphine Handing, MSOT, OTR/L Acute Rehabilitation Services [facility] Office Number: [phone]  Dalphine Handing 12/10/2019, 3:38 PM

## 2019-12-11 ENCOUNTER — Inpatient Hospital Stay (HOSPITAL_COMMUNITY): Payer: Medicaid Other

## 2019-12-11 LAB — HEPARIN LEVEL (UNFRACTIONATED): Heparin Unfractionated: <0.1 IU/mL — ABNORMAL LOW (ref 0.30–0.70)

## 2019-12-11 LAB — CBC
HCT: 23.8 % — ABNORMAL LOW (ref 39.0–52.0)
HCT: 24.4 % — ABNORMAL LOW (ref 39.0–52.0)
Hemoglobin: 7.2 g/dL — ABNORMAL LOW (ref 13.0–17.0)
Hemoglobin: 7.6 g/dL — ABNORMAL LOW (ref 13.0–17.0)
MCH: 27.3 pg (ref 26.0–34.0)
MCH: 27.6 pg (ref 26.0–34.0)
MCHC: 30.3 g/dL (ref 30.0–36.0)
MCHC: 31.1 g/dL (ref 30.0–36.0)
MCV: 90.2 fL (ref 80.0–100.0)
MCV: 88.7 fL (ref 80.0–100.0)
Platelets: 303 10*3/uL (ref 150–400)
Platelets: 303 10*3/uL (ref 150–400)
RBC: 2.64 MIL/uL — ABNORMAL LOW (ref 4.22–5.81)
RBC: 2.75 MIL/uL — ABNORMAL LOW (ref 4.22–5.81)
RDW: 18.2 % — ABNORMAL HIGH (ref 11.5–15.5)
RDW: 17.9 % — ABNORMAL HIGH (ref 11.5–15.5)
WBC: 11.8 10*3/uL — ABNORMAL HIGH (ref 4.0–10.5)
WBC: 12.4 10*3/uL — ABNORMAL HIGH (ref 4.0–10.5)
nRBC: 0.6 % — ABNORMAL HIGH (ref 0.0–0.2)
nRBC: 0.4 % — ABNORMAL HIGH (ref 0.0–0.2)

## 2019-12-11 LAB — BASIC METABOLIC PANEL
Anion gap: 11 (ref 5–15)
BUN: 19 mg/dL (ref 6–20)
CO2: 24 mmol/L (ref 22–32)
Calcium: 7 mg/dL — ABNORMAL LOW (ref 8.9–10.3)
Chloride: 104 mmol/L (ref 98–111)
Creatinine, Ser: 1.14 mg/dL (ref 0.61–1.24)
GFR calc Af Amer: >60 mL/min (ref >60)
GFR calc non Af Amer: >60 mL/min (ref >60)
Glucose, Bld: 99 mg/dL (ref 70–99)
Potassium: 3.9 mmol/L (ref 3.5–5.1)
Sodium: 139 mmol/L (ref 135–145)

## 2019-12-11 LAB — GLUCOSE, CAPILLARY
Glucose-Capillary: 89 mg/dL (ref 70–99)
Glucose-Capillary: 149 mg/dL — ABNORMAL HIGH (ref 70–99)
Glucose-Capillary: 152 mg/dL — ABNORMAL HIGH (ref 70–99)
Glucose-Capillary: 131 mg/dL — ABNORMAL HIGH (ref 70–99)

## 2019-12-11 MED ORDER — FUROSEMIDE 10 MG/ML IJ SOLN
40.0000 mg | Freq: Once | INTRAMUSCULAR | Status: AC
  Administered 2019-12-11: 11:00:00 40 mg via INTRAVENOUS
  Filled 2019-12-11: qty 4

## 2019-12-11 MED ORDER — HEPARIN (PORCINE) 25000 UT/250ML-% IV SOLN
1550.0000 [IU]/h | INTRAVENOUS | Status: DC
  Administered 2019-12-11: 1200 [IU]/h via INTRAVENOUS
  Administered 2019-12-12: 1500 [IU]/h via INTRAVENOUS
  Administered 2019-12-12: 1450 [IU]/h via INTRAVENOUS
  Administered 2019-12-15: 1550 [IU]/h via INTRAVENOUS
  Filled 2019-12-11 (×7): qty 250

## 2019-12-11 NOTE — Progress Notes (Signed)
PULMONARY / CRITICAL CARE MEDICINE   NAME:  Nathaniel Webb, MRN:  [id number], DOB:  09/08/1966, LOS: 30 ADMISSION DATE:  11/11/2019, CONSULTATION DATE:  1/12 REFERRING MD:  Joseph Art, CHIEF COMPLAINT:  Pleural effusion   BRIEF HISTORY:    Nathaniel Webb is a 54 year old male admitted for COVID-19 pneumonia. Hospital course complicated by pulmonary embolism, pleural effusion, hydropneumothorax and pneumomediastinum s/p chest tube, mechanical ventilation, BPF and deconditioning.  SIGNIFICANT PAST MEDICAL HISTORY    Past Medical History:  Diagnosis Date  . Diabetes mellitus without complication (HCC)   . Heel pain 06/09/2015  . High triglycerides 05/09/2016  . Hypertension   . Low HDL (under 40) 05/09/2016  . Obesity, Class II, BMI 35-39.9, with comorbidity (HCC) 01/04/2016   SIGNIFICANT EVENTS:  12/29: Admitted 1/12: PCCM consulted with performance of a thoracentesis 1/13 Tx to ICU with worsening pneumothorax 1/17 intubated 1/18 Continue to have R sided pneumothorax despite chest tube. 28 french placed and extubated that evening 1/19 small bore ct removed, getting lasix. Feeling better and stronger. airleak still brisk 1/20 O2 down to 4L, still desaturating. 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 Improved overal aeration. CVTS> Continue current Rx. ALI Resolved might be VATS Candidate eventually.  1/21 hematuria>Holding LMWH with reassessment 1/22 1/22: PT ordered, patient encouraged ambulation. Start trending CBC again to follow leukocytosis. Possible ct repositioning pending CBC results.  1/25: Low molecular weight heparin discontinued altogether given intraparenchymal hematoma identified on 1/24 via CT scan  STUDIES:   CTA Chest 12/29 >> positive for PE with small volume embolus in the distal right pulmonary artery, lobar, and some segmental branches CT ABD/Pelvis 1/10 >> moderate stool burden in colon, R>L airspace disease  compatible with PNA, loculated right pleural effusion CT Chest w/o 1/10 >> multiloculated right lung hydropneumothorax, larger volume of pleural fluid relative to pleural air, progression of multilobar right lung consolidation, small volume loculated right pneumomediastinum Thora1/12>> glucose <20, LD 2,447, protein 5.2, total nucleated cells 117,299, neutrophil 85 CT Chest w/o 1/13 >> limited exam by motion, large right hydropneumothorax with numerous thin internal septations with air fluid levels increased in size, complete right lung atelectasis, pneumomediastinum in the anterior right pericardial region, similar extensive patchy GGO on L 1/21 CT chest: IMPRESSION: 1. Decreased small to moderate right hydropneumothorax. 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 2. Improved aeration of the right lung with residual mild to moderate atelectasis. 3. Decreased pneumomediastinum in the anterior right pericardial region. 4. Extensive patchy ground-glass density and reticulation throughout the left and right lungs, improved from prior CT, remains consistent with COVID-19 pneumonia. 5. Aortic atherosclerosis 1/24 CT chest 1. Interval development of a 12 x 11 cm collection that appears to be centered within the right lower lobe parenchyma. This is suboptimally evaluated in the absence of IV contrast. Differential considerations include a lung abscess or hematoma or a combination of both. If there is clinical concern for active bleeding, a contrast enhanced CT of the chest timed as a PE study is recommended. 2. Persistent right-sided pneumothorax with a well-positioned right-sided chest tube. Overall, the size of the pneumothorax is relatively stable. 3. Small to moderate size right-sided pleural effusion. 4. Persistent diffuse bilateral airspace opacities similar to prior study. CULTURES:  1/10 Blood Culture: Staph Aureus. R Erythromycin, Clindamycin,   1/12 Body Pleural Fluid: Staph Aureus. R Erythromcyin, Clindamycin ANTIBIOTICS:  Remdesivir 12/27>1/1 Tocilizumab: 12/28> Cefepime: 1/10>>1/14 Linezolid 1/10>>1/14 Cefezolin  1/14>>  LINES/TUBES:  12/27 R Antecubital 1/18 Catheter  1/19 R Chest Tube 28 French   CONSULTANTS:  PCCM Cardiothoracic Surgery PMR  SUBJECTIVE:  Reports feeling weaker, more SOB and more fatigued today. O2 saturations 100% on 5L. Chest tube with 60cc output/24 hours with persistent airleak  CONSTITUTIONAL: BP 124/71 (BP Location: Left Arm)   Pulse (!) 101   Temp 98.2 F (36.8 C) (Oral)   Resp (!) 26   Ht 5\' 5"  (1.651 m)   Wt 91.7 kg   SpO2 96%   BMI 33.64 kg/m   I/O last 3 completed shifts: In: 1610 [P.O.:810; I.V.:500; IV Piggyback:300] Out: 4165 [Urine:4035; Chest Tube:130]        Physical Exam: General: Chronically ill appearing male, appears uncomfortable but no acute distress HENT: [place], AT, OP clear, MMM, poor dentition Eyes: EOMI, no scleral icterus Respiratory: Bilateral rhonchi, right chest tube in place with PAL Cardiovascular: RRR, -M/R/G, no JVD GI: BS+, soft, nontender Extremities: Trace pedal edema,-tenderness Neuro: AAO x4, CNII-XII grossly intact Psych: Normal mood, normal affect GU: Foley in place  CXR 12/11/19 reviewed and interpreted by me - right chest tube in place. Unchanged loculated pleural effusion with , unchanged bilateral patchy opacities.  RESOLVED PROBLEM LIST  Acute hypoxic respiratory failure 2/2 COVID PNA w/ ALI Hematuria  AKI ASSESSMENT AND PLAN    Acute hypoxemic respiratory failure. 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 Airleak remains present.  --Wean supplemental O2 for goal >88% --Continue chest tube to suction --Continue abx per ID --Appreciate CTS input. Though VATS would be definitive therapy for BPF, his oxygenation and pneumonitis make patient high  risk for surgery including prolonged mechanical ventilation. Consult team recommends conservative management, deferring VATS at this time. --Aggressive PT with OOB as tolerated --Scheduled duonebs --Repeat lasix 40 mg OTO  RLL Hematoma in setting of PE. On reviewing imaging from 12/29 to now and it appears that after his COVID-19 pneumonia, his lung has suffered severe parenchymal destruction leading to cystic and bullae process that unfortunately has been slow to resolve and now has evidence of what is likely hematoma. My suspicion is low for abscess with improving clinical status and leukocytosis in setting of antibiotics. LE dopplers neg for DVT. Hg remains stable --Start high risk heparin gtt protocol targeting low-end Xa levels --Trend CBC  MSSA empyema c/b bacteremia. Loculated pleural effusion.  ID following.  --Continue Cefazolin until 2/10.  --CTS deferring VATS as noted above until improved baseline function to tolerate surgery. --Check Othopantogram.  Acute blood loss anemia  Hg stable. Anticoagulation has been on hold now for several days.  Did require transfusion on 23rd Plan  Serial CBCs. Heparin restarted 1/28.  Small Acute Pulmonary Embolism but no DVT noted on [place] 1/25 - 12/29 PE appreciated in the Right distal pulmonary artery  Plan Restarting anticoagulation 1/28  Hypertension:  Plan We will continue Lopressor 50 mg twice daily  Diabetes Mellitis:  Excellent glycemic control Plan Sliding scale and basal insulin to continue at current dosing  Constipation Plan Stool regimen ordered  Nasal congestion Plan Continuing nasal saline rinse and nasal steroids   Best Practice / Goals of Care / Disposition.   DVT PROPHYLAXIS: Lovenox SUP:NA NUTRITION:Dysphagia 3  MOBILITY: Bed rest GOALS OF CARE:FULL FAMILY DISCUSSIONS: Updated patient on plan DISPOSITION progressive care, eventually CIR candidate, consult note from 21st says chest tubes need to be  out  Rodman Pickle, M.D. [facility] Pulmonary/Critical Care Medicine  12/11/2019 9:23 AM

## 2019-12-11 NOTE — Progress Notes (Signed)
ANTICOAGULATION CONSULT NOTE - Follow Up Consult  Pharmacy Consult for heparin  Indication: PE  No Known Allergies  Patient Measurements: Height: 5\' 5"  (165.1 cm) Weight: 202 lb 2.6 oz (91.7 kg) IBW/kg (Calculated) : 61.5 Heparin Dosing Weight: 85 kg  Vital Signs: Temp: 98 F (36.7 C) (01/28 2005) Temp Source: Oral (01/28 2005) BP: 105/79 (01/28 2005) Pulse Rate: 92 (01/28 2005)  Labs: Recent Labs    12/09/19 0500 12/09/19 0500 12/10/19 0511 12/10/19 0511 12/11/19 0441 12/11/19 1951  HGB 7.1*   < > 7.4*   < > 7.2* 7.6*  HCT 22.6*   < > 23.7*  --  23.8* 24.4*  PLT 266   < > 289  --  303 303  HEPARINUNFRC  --   --   --   --   --  <0.10*  CREATININE 1.55*  --  1.27*  --  1.14  --    < > = values in this interval not displayed.    Estimated Creatinine Clearance: 78.9 mL/min (by C-G formula based on SCr of 1.14 mg/dL).   Medications:  Scheduled:  . vitamin C  500 mg Oral Daily  . aspirin EC  81 mg Oral Daily  . atorvastatin  10 mg Oral QPM  . chlorhexidine  15 mL Mouth/Throat BID  . Chlorhexidine Gluconate Cloth  6 each Topical Daily  . chlorpheniramine-HYDROcodone  5 mL Oral Q12H  . docusate sodium  200 mg Oral BID  . feeding supplement (ENSURE ENLIVE)  237 mL Oral TID BM  . fluticasone  2 spray Each Nare BID  . insulin aspart  0-20 Units Subcutaneous TID WC  . insulin detemir  18 Units Subcutaneous Daily  . ipratropium-albuterol  3 mL Nebulization TID  . mouth rinse  15 mL Mouth Rinse BID  . Melatonin  3 mg Oral QHS  . metoprolol tartrate  50 mg Oral BID  . polyethylene glycol  17 g Oral Daily  . psyllium  1 packet Oral Daily  . senna-docusate  3 tablet Oral BID  . sodium chloride  2 spray Each Nare QID  . sodium chloride flush  10-40 mL Intracatheter Q12H  . sodium chloride flush  3 mL Intravenous Q12H  . zinc sulfate  220 mg Oral Daily   Infusions:  .  ceFAZolin (ANCEF) IV 2 g (12/11/19 2023)  . heparin 1,200 Units/hr (12/11/19 1800)     Assessment: 54 yo male with COVID pneumonitis started on Eliquis for PE, held for thoracentesis 1/12 and chest tube placement on 1/13.     Situation complicated by RLL hematoma, was taken off full dose lovenox 1/25 and placed on prophylaxis. Hgb low but stable this am at 7.2, plt wnl. No overt bleeding noted. Discussed with CCM and they would like to restart anticoagulation but recognizing high bleeding risk with hematoma opted for IV heparin over lovenox.  -heparin level < 0.1  Goal of Therapy:  Heparin level 0.3-0.5 units/ml Monitor platelets by anticoagulation protocol: Yes   Plan: 1 -Increase heparin to 1500 units/hr -Heparin level in 6 hours and daily wth CBC daily  2/25, PharmD Clinical Pharmacist **Pharmacist phone directory can now be found on [email or web address] ([email or web address]).  Listed under [facility].

## 2019-12-11 NOTE — Progress Notes (Signed)
Physical Therapy Treatment Patient Details Name: Nathaniel Webb MRN: [id number] DOB: Aug 22, 1966 Today's Date: 12/11/2019    History of Present Illness 54 y/o male w/ hx of Obesity, low HDL, HTN, DM, presented [facility] (12/29) with approximately 1 week history of gradually worsening shortness of breath.  He was found to have acute hypoxic respiratory failure and was requiring high flow oxygen to maintain O2 saturations.  Chest x-ray was typical of Covid pneumonitis. Pt dx w/ COVID 12/23 and dc home w/ spouse. 1/10 CXR showed ARDS bilateral increasing opacification; respiratory status worsened; concerns for aspiration. Acute PE; loculated right pleural effusion; 1/11 CTS consult, recs chest tube. 1/12 CT chest with densely consolidated right lung, air fluid level/ hydropneumothorax. Thoracentesis 1/12; pleural fluid sampled and concerning for empyema.  1/13 emergently transferred to ICU for worsening hypoxia; CT showed worsening right hydropneumothorax with loculations in pleura. Emergent right chest tube placed. Pt intubated 11/30/19-12/01/19. Pt developed hematuria on 1/21.    PT Comments    Pt in bed upon arrival of PT, agreeable to PT session with focus on progression of mobility and activity tolerance. The pt continues to present with limitations in functional mobility, activity tolerance, and strength compared to his prior level of function and independence due to above dx and subsequent hospital stay. The pt was able to tolerate multiple LE exercises while sitting EOB with good maintenance of vital signs. The pt was able to demo good hip clearance with lateral scooting, but declined ambulation today due to frequency of coughing fits. The pt was instructed in use of IS and acapella devices and RN staff was informed that pt should be using multiple times per hour. All verbalized agreement with plan. PT will continue to follow acutely to further progress mobility and strengthening.     Follow Up  Recommendations  CIR     Equipment Recommendations  Rolling walker with 5" wheels;3in1 (PT)    Recommendations for Other Services       Precautions / Restrictions Precautions Precautions: Fall;Other (comment) Precaution Comments: R sided Chest tube, monitor SpO2 and RR Restrictions Weight Bearing Restrictions: No    Mobility  Bed Mobility Overal bed mobility: Needs Assistance Bed Mobility: Supine to Sit;Sit to Supine     Supine to sit: Supervision;HOB elevated Sit to supine: Supervision;HOB elevated   General bed mobility comments: increased time/effort, S for safety and assist with line management; cues to slow RR/cues for pursed lip breathing  Transfers Overall transfer level: Needs assistance Equipment used: None Transfers: Lateral/Scoot Transfers          Lateral/Scoot Transfers: Supervision General transfer comment: pt declined sit-standd today due to extensive coughing episodes, able to demo multiple lateral scoots at EOB with good hip clearance  Ambulation/Gait                 Stairs             Wheelchair Mobility    Modified Rankin (Stroke Patients Only)       Balance Overall balance assessment: Needs assistance Sitting-balance support: Bilateral upper extremity supported;Feet supported Sitting balance-Leahy Scale: Good Sitting balance - Comments: S for safety   Standing balance support: Bilateral upper extremity supported;During functional activity Standing balance-Leahy Scale: Fair Standing balance comment: reliant on bil. UE support                             Cognition Arousal/Alertness: Awake/alert Behavior During Therapy: Anxious Overall Cognitive  Status: Within Functional Limits for tasks assessed                                 General Comments: continues to be anxious during session, often needed cues for slowing breathing to reduce RR      Exercises General Exercises - Lower  Extremity Long Arc Quad: Strengthening;Both;10 reps;Seated(2 x 10 with manual resistance) Hip Flexion/Marching: AROM;Both;20 reps;Seated    General Comments General comments (skin integrity, edema, etc.): O2 >93% through session. Pt reports he has not been using IS or acapella. completed x5 reps with each. limited due to coughing fits. pt instructed to complete at least x5 per hour, RN staff notified      Pertinent Vitals/Pain Pain Assessment: Faces Faces Pain Scale: Hurts little more Pain Location: B feet Pain Descriptors / Indicators: Throbbing;Tender;Sore Pain Intervention(s): Limited activity within patient's tolerance;Monitored during session;Repositioned    Home Living                      Prior Function            PT Goals (current goals can now be found in the care plan section) Acute Rehab PT Goals Patient Stated Goal: to go home Time For Goal Achievement: 12/16/19 Potential to Achieve Goals: Fair Progress towards PT goals: Progressing toward goals    Frequency    Min 3X/week      PT Plan Current plan remains appropriate    Co-evaluation              AM-PAC PT "6 Clicks" Mobility   Outcome Measure  Help needed turning from your back to your side while in a flat bed without using bedrails?: A Little Help needed moving from lying on your back to sitting on the side of a flat bed without using bedrails?: A Little Help needed moving to and from a bed to a chair (including a wheelchair)?: A Little Help needed standing up from a chair using your arms (e.g., wheelchair or bedside chair)?: A Little Help needed to walk in hospital room?: A Lot Help needed climbing 3-5 steps with a railing? : Total 6 Click Score: 15    End of Session Equipment Utilized During Treatment: Oxygen Activity Tolerance: Patient limited by fatigue Patient left: in chair;with call bell/phone within reach Nurse Communication: Mobility status(need to use IS and acapella) PT  Visit Diagnosis: Unsteadiness on feet (R26.81);Muscle weakness (generalized) (M62.81)     Time: 0768-0881 PT Time Calculation (min) (ACUTE ONLY): 31 min  Charges:  $Therapeutic Exercise: 23-37 mins                     Rolm Baptise, PT, DPT   Acute Rehabilitation Department Pager #: [phone]   Gaetana Michaelis 12/11/2019, 3:30 PM

## 2019-12-11 NOTE — Progress Notes (Signed)
ANTICOAGULATION CONSULT NOTE - Follow Up Consult  Pharmacy Consult for Lovenox Indication: PE  No Known Allergies  Patient Measurements: Height: 5\' 5"  (165.1 cm) Weight: 202 lb 2.6 oz (91.7 kg) IBW/kg (Calculated) : 61.5 Heparin Dosing Weight: 85 kg  Vital Signs: Temp: 98.2 F (36.8 C) (01/28 0806) Temp Source: Oral (01/28 0806) BP: 129/81 (01/28 1014) Pulse Rate: 109 (01/28 1014)  Labs: Recent Labs    12/09/19 0500 12/09/19 0500 12/10/19 0511 12/11/19 0441  HGB 7.1*   < > 7.4* 7.2*  HCT 22.6*  --  23.7* 23.8*  PLT 266  --  289 303  CREATININE 1.55*  --  1.27* 1.14   < > = values in this interval not displayed.    Estimated Creatinine Clearance: 78.9 mL/min (by C-G formula based on SCr of 1.14 mg/dL).   Medications:  Scheduled:  . vitamin C  500 mg Oral Daily  . aspirin EC  81 mg Oral Daily  . atorvastatin  10 mg Oral QPM  . chlorhexidine  15 mL Mouth/Throat BID  . Chlorhexidine Gluconate Cloth  6 each Topical Daily  . chlorpheniramine-HYDROcodone  5 mL Oral Q12H  . docusate sodium  200 mg Oral BID  . feeding supplement (ENSURE ENLIVE)  237 mL Oral TID BM  . fluticasone  2 spray Each Nare BID  . furosemide  40 mg Intravenous Once  . insulin aspart  0-20 Units Subcutaneous TID WC  . insulin detemir  18 Units Subcutaneous Daily  . ipratropium-albuterol  3 mL Nebulization TID  . mouth rinse  15 mL Mouth Rinse BID  . Melatonin  3 mg Oral QHS  . metoprolol tartrate  50 mg Oral BID  . polyethylene glycol  17 g Oral Daily  . psyllium  1 packet Oral Daily  . senna-docusate  3 tablet Oral BID  . sodium chloride  2 spray Each Nare QID  . sodium chloride flush  10-40 mL Intracatheter Q12H  . sodium chloride flush  3 mL Intravenous Q12H  . zinc sulfate  220 mg Oral Daily   Infusions:  .  ceFAZolin (ANCEF) IV 2 g (12/11/19 12/13/19)    Assessment: 54 yo male with COVID pneumonitis started on Eliquis for PE, held for thoracentesis 1/12 and chest tube placement on  1/13.     Situation complicated by RLL hematoma, was taken off full dose lovenox 1/25 and placed on prophylaxis. Hgb low but stable this am at 7.2, plt wnl. No overt bleeding noted. Discussed with CCM and they would like to restart anticoagulation but recognizing high bleeding risk with hematoma opted for IV heparin over lovenox. Will aim for low end of therapeutic goal and watch CBC closely.   Goal of Therapy:  Heparin level 0.3-0.5 units/ml Monitor platelets by anticoagulation protocol: Yes   Plan:  Start heparin at 1200 units/hr Check anti-xa level this evening then daily  Thank you,   2/25 PharmD., BCPS Clinical Pharmacist 12/11/2019 10:27 AM

## 2019-12-11 NOTE — Progress Notes (Signed)
Attempt made to locate belongings from admission in December.   Reported from [facility], patient noted to arrive with no belongs.  [place] ICU contacted and stated, if any belongings present they have been disposed of.

## 2019-12-12 LAB — BASIC METABOLIC PANEL
Anion gap: 11 (ref 5–15)
BUN: 14 mg/dL (ref 6–20)
CO2: 24 mmol/L (ref 22–32)
Calcium: 7.2 mg/dL — ABNORMAL LOW (ref 8.9–10.3)
Chloride: 105 mmol/L (ref 98–111)
Creatinine, Ser: 0.99 mg/dL (ref 0.61–1.24)
GFR calc Af Amer: >60 mL/min (ref >60)
GFR calc non Af Amer: >60 mL/min (ref >60)
Glucose, Bld: 94 mg/dL (ref 70–99)
Potassium: 3.9 mmol/L (ref 3.5–5.1)
Sodium: 140 mmol/L (ref 135–145)

## 2019-12-12 LAB — CBC
HCT: 23.9 % — ABNORMAL LOW (ref 39.0–52.0)
Hemoglobin: 7.5 g/dL — ABNORMAL LOW (ref 13.0–17.0)
MCH: 27.9 pg (ref 26.0–34.0)
MCHC: 31.4 g/dL (ref 30.0–36.0)
MCV: 88.8 fL (ref 80.0–100.0)
Platelets: 320 10*3/uL (ref 150–400)
RBC: 2.69 MIL/uL — ABNORMAL LOW (ref 4.22–5.81)
RDW: 17.9 % — ABNORMAL HIGH (ref 11.5–15.5)
WBC: 12 10*3/uL — ABNORMAL HIGH (ref 4.0–10.5)
nRBC: 0.2 % (ref 0.0–0.2)

## 2019-12-12 LAB — GLUCOSE, CAPILLARY
Glucose-Capillary: 135 mg/dL — ABNORMAL HIGH (ref 70–99)
Glucose-Capillary: 112 mg/dL — ABNORMAL HIGH (ref 70–99)
Glucose-Capillary: 147 mg/dL — ABNORMAL HIGH (ref 70–99)
Glucose-Capillary: 125 mg/dL — ABNORMAL HIGH (ref 70–99)

## 2019-12-12 LAB — HEPARIN LEVEL (UNFRACTIONATED)
Heparin Unfractionated: 0.45 IU/mL (ref 0.30–0.70)
Heparin Unfractionated: 0.4 IU/mL (ref 0.30–0.70)

## 2019-12-12 MED ORDER — FUROSEMIDE 10 MG/ML IJ SOLN
40.0000 mg | Freq: Once | INTRAMUSCULAR | Status: AC
  Administered 2019-12-12: 13:00:00 40 mg via INTRAVENOUS
  Filled 2019-12-12: qty 4

## 2019-12-12 NOTE — Progress Notes (Signed)
    [facility]   Reason for visit: Follow up on MSSA bacteremia  Interval History: WBC remains at 12, afebrile.  No new complaints.  No associated n/v/d.      Physical Exam: Constitutional:  Vitals:   12/12/19 0357 12/12/19 0750  BP: 115/85 124/84  Pulse: 84 94  Resp: (!) 27 (!) 26  Temp: 98.6 F (37 C) 97.6 F (36.4 C)  SpO2: 99% 96%   patient is in no acute respiratory distress Respiratory: + wheezes, decreased from previous Cardiovascular: RRR GI: soft, nt, nd  Review of Systems: Constitutional: negative for fevers and chills Gastrointestinal: negative for diarrhea  Lab Results  Component Value Date   WBC 12.0 (H) 12/12/2019   HGB 7.5 (L) 12/12/2019   HCT 23.9 (L) 12/12/2019   MCV 88.8 12/12/2019   PLT 320 12/12/2019    Lab Results  Component Value Date   CREATININE 0.99 12/12/2019   BUN 14 12/12/2019   NA 140 12/12/2019   K 3.9 12/12/2019   CL 105 12/12/2019   CO2 24 12/12/2019    Lab Results  Component Value Date   ALT 133 (H) 12/03/2019   AST 73 (H) 12/03/2019   ALKPHOS 159 (H) 12/03/2019     Microbiology: Recent Results (from the past 240 hour(s))  MRSA PCR Screening     Status: None   Collection Time: 12/05/19  9:17 AM   Specimen: Nasal Mucosa; Nasopharyngeal  Result Value Ref Range Status   MRSA by PCR NEGATIVE NEGATIVE Final    Comment:        The GeneXpert MRSA Assay (FDA approved for NASAL specimens only), is one component of a comprehensive MRSA colonization surveillance program. It is not intended to diagnose MRSA infection nor to guide or monitor treatment for MRSA infections. Performed at [facility], [address] [place], [place] [address]     Impression/Plan:  1. MSSA bacteremia - he will continue with cefazolin through 2/10.  Depending on further interventions, he may need to continue with oral therapy at that time.    2.  Pulmonary empyema - with MSSA and on antibiotics as above.   Tolerating with normal creat.    Dr. Drue Second on over the weekend if needed, otherwise Dr. Daiva Eves will follow up next week.

## 2019-12-12 NOTE — Progress Notes (Signed)
ANTICOAGULATION CONSULT NOTE - Follow Up Consult  Pharmacy Consult for heparin  Indication: PE  No Known Allergies  Patient Measurements: Height: 5\' 5"  (165.1 cm) Weight: 202 lb 2.6 oz (91.7 kg) IBW/kg (Calculated) : 61.5 Heparin Dosing Weight: 85 kg  Vital Signs: Temp: 98.1 F (36.7 C) (01/29 1139) Temp Source: Oral (01/29 1139) BP: 120/84 (01/29 1139) Pulse Rate: 96 (01/29 1139)  Labs: Recent Labs    12/10/19 0511 12/10/19 0511 12/11/19 0441 12/11/19 0441 12/11/19 1951 12/12/19 0730 12/12/19 1400  HGB 7.4*   < > 7.2*   < > 7.6* 7.5*  --   HCT 23.7*   < > 23.8*  --  24.4* 23.9*  --   PLT 289   < > 303  --  303 320  --   HEPARINUNFRC  --   --   --   --  <0.10* 0.45 0.40  CREATININE 1.27*  --  1.14  --   --  0.99  --    < > = values in this interval not displayed.    Estimated Creatinine Clearance: 90.9 mL/min (by C-G formula based on SCr of 0.99 mg/dL).   Medications:  Scheduled:  . vitamin C  500 mg Oral Daily  . aspirin EC  81 mg Oral Daily  . atorvastatin  10 mg Oral QPM  . chlorhexidine  15 mL Mouth/Throat BID  . Chlorhexidine Gluconate Cloth  6 each Topical Daily  . chlorpheniramine-HYDROcodone  5 mL Oral Q12H  . docusate sodium  200 mg Oral BID  . feeding supplement (ENSURE ENLIVE)  237 mL Oral TID BM  . fluticasone  2 spray Each Nare BID  . insulin aspart  0-20 Units Subcutaneous TID WC  . insulin detemir  18 Units Subcutaneous Daily  . ipratropium-albuterol  3 mL Nebulization TID  . mouth rinse  15 mL Mouth Rinse BID  . Melatonin  3 mg Oral QHS  . metoprolol tartrate  50 mg Oral BID  . polyethylene glycol  17 g Oral Daily  . psyllium  1 packet Oral Daily  . senna-docusate  3 tablet Oral BID  . sodium chloride  2 spray Each Nare QID  . sodium chloride flush  10-40 mL Intracatheter Q12H  . sodium chloride flush  3 mL Intravenous Q12H  . zinc sulfate  220 mg Oral Daily   Infusions:  .  ceFAZolin (ANCEF) IV 2 g (12/12/19 1509)  . heparin 1,450  Units/hr (12/12/19 12/14/19)    Assessment: 54 yo male with COVID pneumonitis started on Eliquis for PE, held for thoracentesis 1/12 and chest tube placement on 1/13.    Situation complicated by RLL hematoma, was taken off full dose lovenox 1/25 and placed on prophylaxis now back on IV heparin.  Hgb low but stable this am at 7.5, plt wnl. No overt bleeding noted. Heparin level continues to be at goal this afternoon   Goal of Therapy:  Heparin level 0.3-0.5 units/ml Monitor platelets by anticoagulation protocol: Yes   Plan:  -Continue heparin at 1450 units/hr -Heparin level daily wth CBC daily  2/25 PharmD., BCPS Clinical Pharmacist 12/12/2019 3:34 PM

## 2019-12-12 NOTE — Progress Notes (Signed)
Physical Therapy Treatment Patient Details Name: Nathaniel Webb MRN: [id number] DOB: March 09, 1966 Today's Date: 12/12/2019    History of Present Illness 54 y/o male w/ hx of Obesity, low HDL, HTN, DM, presented to [facility] (12/29) with approximately 1 week history of gradually worsening shortness of breath.  He was found to have acute hypoxic respiratory failure and was requiring high flow oxygen to maintain O2 saturations.  Chest x-ray was typical of Covid pneumonitis. Pt dx w/ COVID 12/23 and dc home w/ spouse. 1/10 CXR showed ARDS bilateral increasing opacification; respiratory status worsened; concerns for aspiration. Acute PE; loculated right pleural effusion; 1/11 CTS consult, recs chest tube. 1/12 CT chest with densely consolidated right lung, air fluid level/ hydropneumothorax. Thoracentesis 1/12; pleural fluid sampled and concerning for empyema.  1/13 emergently transferred to ICU for worsening hypoxia; CT showed worsening right hydropneumothorax with loculations in pleura. Emergent right chest tube placed. Pt intubated 11/30/19-12/01/19. Pt developed hematuria on 1/21.    PT Comments    Pt limited by fatigue during session, exacerbated by coughing spells and anxiety. Pt self-limiting at end fo session, refusing further attempts at ambulation despite stable vitals. Pt continues to require physical assistance for all OOB mobility and increased supplemental oxygen during mobility. Pt is anxious with mobility, PT providing education on pursed lip breathing and slowing RR. Pt will benefit from continued acute PT POC to improve activity tolerance and reduce falls risk.   Follow Up Recommendations  CIR     Equipment Recommendations  Rolling walker with 5" wheels;3in1 (PT)    Recommendations for Other Services       Precautions / Restrictions Precautions Precautions: Fall;Other (comment) Precaution Comments: R sided Chest tube, monitor SpO2 and RR Restrictions Weight Bearing Restrictions: No     Mobility  Bed Mobility Overal bed mobility: Needs Assistance Bed Mobility: Supine to Sit     Supine to sit: Supervision;HOB elevated     General bed mobility comments: use of bed rail to pull to edge of bed  Transfers Overall transfer level: Needs assistance Equipment used: Rolling walker (2 wheeled) Transfers: Sit to/from Stand Sit to Stand: Min assist            Ambulation/Gait Ambulation/Gait assistance: Min Chemical engineer (Feet): 2 Feet Assistive device: Rolling walker (2 wheeled) Gait Pattern/deviations: Step-to pattern;Shuffle;Trunk flexed Gait velocity: reduced Gait velocity interpretation: <1.8 ft/sec, indicate of risk for recurrent falls General Gait Details: short shuffling steps from bed to recliner   Stairs             Wheelchair Mobility    Modified Rankin (Stroke Patients Only)       Balance Overall balance assessment: Needs assistance Sitting-balance support: Single extremity supported;Feet supported Sitting balance-Leahy Scale: Fair Sitting balance - Comments: close supervision   Standing balance support: Bilateral upper extremity supported Standing balance-Leahy Scale: Fair Standing balance comment: minG-minA with BUE support of RW                            Cognition Arousal/Alertness: Awake/alert Behavior During Therapy: Anxious Overall Cognitive Status: Within Functional Limits for tasks assessed                                        Exercises Other Exercises Other Exercises: PROM calf stretch 2 minutes bilaterally Other Exercises: towel calf stretch 3 reps 30 second  holds bilaterally    General Comments General comments (skin integrity, edema, etc.): pt on 4L HFNC at rest, desaturates to 87% with coughing while sitting edge of bed. PT increases SpO2 to 6L HFNC with improved saturation to 96%. Pt tolerates transfer to chair well at 6L HFNC, able to wean back to 4L HFNC once resting in  recliner.      Pertinent Vitals/Pain Pain Assessment: Faces Faces Pain Scale: Hurts little more Pain Location: feet Pain Descriptors / Indicators: Aching Pain Intervention(s): Limited activity within patient's tolerance    Home Living                      Prior Function            PT Goals (current goals can now be found in the care plan section) Acute Rehab PT Goals Patient Stated Goal: to go home Progress towards PT goals: Not progressing toward goals - comment(limited by fatigue and respiratoy status)    Frequency    Min 3X/week      PT Plan Current plan remains appropriate    Co-evaluation              AM-PAC PT "6 Clicks" Mobility   Outcome Measure  Help needed turning from your back to your side while in a flat bed without using bedrails?: A Little Help needed moving from lying on your back to sitting on the side of a flat bed without using bedrails?: A Little Help needed moving to and from a bed to a chair (including a wheelchair)?: A Little Help needed standing up from a chair using your arms (e.g., wheelchair or bedside chair)?: A Little Help needed to walk in hospital room?: A Lot Help needed climbing 3-5 steps with a railing? : Total 6 Click Score: 15    End of Session Equipment Utilized During Treatment: Oxygen Activity Tolerance: Patient limited by fatigue Patient left: in chair;with call bell/phone within reach Nurse Communication: Mobility status PT Visit Diagnosis: Unsteadiness on feet (R26.81);Muscle weakness (generalized) (M62.81)     Time: 1749-4496 PT Time Calculation (min) (ACUTE ONLY): 28 min  Charges:  $Therapeutic Exercise: 8-22 mins $Therapeutic Activity: 8-22 mins                     Zenaida Niece, PT, DPT Acute Rehabilitation Pager: [phone]    Zenaida Niece 12/12/2019, 2:09 PM

## 2019-12-12 NOTE — Progress Notes (Addendum)
PULMONARY / CRITICAL CARE MEDICINE   NAME:  Nathaniel Webb, MRN:  [id number], DOB:  03/18/1966, LOS: 31 ADMISSION DATE:  11/11/2019, CONSULTATION DATE:  1/12 REFERRING MD:  Joseph Art, CHIEF COMPLAINT:  Pleural effusion   BRIEF HISTORY:    [name] is a 54 year old male admitted for COVID-19 pneumonia. Hospital course complicated by pulmonary embolism, pleural effusion, hydropneumothorax and pneumomediastinum s/p chest tube, mechanical ventilation, BPF and deconditioning.  SIGNIFICANT PAST MEDICAL HISTORY    Past Medical History:  Diagnosis Date  . Diabetes mellitus without complication (HCC)   . Heel pain 06/09/2015  . High triglycerides 05/09/2016  . Hypertension   . Low HDL (under 40) 05/09/2016  . Obesity, Class II, BMI 35-39.9, with comorbidity (HCC) 01/04/2016   SIGNIFICANT EVENTS:  12/29: Admitted 1/12: PCCM consulted with performance of a thoracentesis 1/13 Tx to ICU with worsening pneumothorax 1/17 intubated 1/18 Continue to have R sided pneumothorax despite chest tube. 28 french placed and extubated that evening 1/19 small bore ct removed, getting lasix. Feeling better and stronger. airleak still brisk 1/20 O2 down to 4L, still desaturating. 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 Improved overal aeration. CVTS> Continue current Rx. ALI Resolved might be VATS Candidate eventually.  1/21 hematuria>Holding LMWH with reassessment 1/22 1/22: PT ordered, patient encouraged ambulation. Start trending CBC again to follow leukocytosis. Possible ct repositioning pending CBC results.  1/25: Low molecular weight heparin discontinued altogether given intraparenchymal hematoma identified on 1/24 via CT scan 1/28 Restarted anticoagulation with heparin high risk protocol  STUDIES:   CTA Chest 12/29 >> positive for PE with small volume embolus in the distal right pulmonary artery, lobar, and some segmental branches CT ABD/Pelvis  1/10 >> moderate stool burden in colon, R>L airspace disease compatible with PNA, loculated right pleural effusion CT Chest w/o 1/10 >> multiloculated right lung hydropneumothorax, larger volume of pleural fluid relative to pleural air, progression of multilobar right lung consolidation, small volume loculated right pneumomediastinum Thora1/12>> glucose <20, LD 2,447, protein 5.2, total nucleated cells 117,299, neutrophil 85 CT Chest w/o 1/13 >> limited exam by motion, large right hydropneumothorax with numerous thin internal septations with air fluid levels increased in size, complete right lung atelectasis, pneumomediastinum in the anterior right pericardial region, similar extensive patchy GGO on L 1/21 CT chest: IMPRESSION: 1. Decreased small to moderate right hydropneumothorax. 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 2. Improved aeration of the right lung with residual mild to moderate atelectasis. 3. Decreased pneumomediastinum in the anterior right pericardial region. 4. Extensive patchy ground-glass density and reticulation throughout the left and right lungs, improved from prior CT, remains consistent with COVID-19 pneumonia. 5. Aortic atherosclerosis 1/24 CT chest 1. Interval development of a 12 x 11 cm collection that appears to be centered within the right lower lobe parenchyma. This is suboptimally evaluated in the absence of IV contrast. Differential considerations include a lung abscess or hematoma or a combination of both. If there is clinical concern for active bleeding, a contrast enhanced CT of the chest timed as a PE study is recommended. 2. Persistent right-sided pneumothorax with a well-positioned right-sided chest tube. Overall, the size of the pneumothorax is relatively stable. 3. Small to moderate size right-sided pleural effusion. 4. Persistent diffuse bilateral airspace opacities similar to prior study. CULTURES:   1/10 Blood Culture: Staph Aureus. R Erythromycin, Clindamycin,  1/12 Body Pleural Fluid: Staph Aureus. R Erythromcyin, Clindamycin ANTIBIOTICS:  Remdesivir  12/27>1/1 Tocilizumab: 12/28> Cefepime: 1/10>>1/14 Linezolid 1/10>>1/14 Cefezolin 1/14>>  LINES/TUBES:  12/27 R Antecubital 1/18 Catheter  1/19 R Chest Tube 28 French   CONSULTANTS:  PCCM Cardiothoracic Surgery PMR  SUBJECTIVE:  Reports he still has general weakness and shortness of breath with exertion. Has productive cough with white sputum that is unchanged. Complains of LE edema. Still participating with physical therapy  CONSTITUTIONAL: BP 124/84 (BP Location: Left Arm)   Pulse 94   Temp 97.6 F (36.4 C) (Oral)   Resp (!) 26   Ht 5\' 5"  (1.651 m)   Wt 91.7 kg   SpO2 96%   BMI 33.64 kg/m   I/O last 3 completed shifts: In: 943 [P.O.:340; I.V.:203; IV Piggyback:400] Out: 2730 [Urine:2550; Chest Tube:180]        Physical Exam: General: Chronically ill appearing, deconditioned male laying in bed, no acute distress HENT: [place], AT, OP clear, MMM Eyes: EOMI, no scleral icterus Respiratory: Tachypneic, bilateral rhonchi, squeaks and wheezes, right chest tube in place with persistent air leak Cardiovascular: RRR, -M/R/G, no JVD GI: BS+, soft, nontender Extremities: Non-pitting edema in lower extremities Neuro: AAO x4, CNII-XII grossly intact Skin: Intact, no rashes or bruising Psych: Normal mood, normal affect  RESOLVED PROBLEM LIST  Acute hypoxic respiratory failure 2/2 COVID PNA w/ ALI Hematuria  AKI ASSESSMENT AND PLAN    Acute hypoxemic respiratory failure. 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 Airleak remains persistent --Wean supplemental O2 for goal >88% --Continue chest tube to suction --Continue abx per ID --Appreciate CTS input. Though VATS would be definitive therapy for BPF, his oxygenation and pneumonitis  make patient high risk for surgery including prolonged mechanical ventilation. Consult team recommends conservative management, deferring VATS at this time. --Aggressive PT with OOB as tolerated --Scheduled duonebs --Repeat lasix again today. May need standing order.  RLL Hematoma in setting of PE. On reviewing imaging from 12/29 to now and it appears that after his COVID-19 pneumonia, his lung has suffered severe parenchymal destruction leading to cystic and bullae process that unfortunately has been slow to resolve and now has evidence of what is likely hematoma. My suspicion is low for abscess with improving clinical status and leukocytosis in setting of antibiotics. LE dopplers neg for DVT.  --Hg stable --Continue high risk heparin gtt protocol targeting low-end Xa levels --Trend CBC  MSSA empyema c/b bacteremia. Loculated pleural effusion.  ID following.  --Continue Cefazolin until 2/10.  --CTS deferring VATS as noted above until improved enough to tolerate surgery.  --Check Othopantogram.  Acute blood loss anemia  Hg stable. Anticoagulation has been on hold now for several days.  Did require transfusion on 23rd Plan  Serial CBCs. Heparin restarted 1/28.  Small Acute Pulmonary Embolism but no DVT noted on 2/28 1/25 - 12/29 PE appreciated in the Right distal pulmonary artery  Plan Restarted anticoagulation 1/28  Hypertension:  Plan We will continue Lopressor 50 mg twice daily  Diabetes Mellitis:  Excellent glycemic control Plan Sliding scale and basal insulin to continue at current dosing  Constipation Plan Stool regimen ordered  Nasal congestion Plan Continuing nasal saline rinse and nasal steroids   Best Practice / Goals of Care / Disposition.   DVT PROPHYLAXIS: Lovenox SUP:NA NUTRITION:Dysphagia 3  MOBILITY: Bed rest GOALS OF CARE:FULL FAMILY DISCUSSIONS: Updated patient on plan DISPOSITION progressive care, eventually CIR candidate, consult note from 21st  says chest tubes need to be out  2/28, M.D. [facility] Pulmonary/Critical Care  Medicine 12/12/2019 10:45 AM

## 2019-12-12 NOTE — Progress Notes (Signed)
ANTICOAGULATION CONSULT NOTE - Follow Up Consult  Pharmacy Consult for heparin  Indication: PE  No Known Allergies  Patient Measurements: Height: 5\' 5"  (165.1 cm) Weight: 202 lb 2.6 oz (91.7 kg) IBW/kg (Calculated) : 61.5 Heparin Dosing Weight: 85 kg  Vital Signs: Temp: 97.6 F (36.4 C) (01/29 0750) Temp Source: Oral (01/29 0750) BP: 124/84 (01/29 0750) Pulse Rate: 94 (01/29 0750)  Labs: Recent Labs    12/10/19 0511 12/10/19 0511 12/11/19 0441 12/11/19 0441 12/11/19 1951 12/12/19 0730  HGB 7.4*   < > 7.2*   < > 7.6* 7.5*  HCT 23.7*   < > 23.8*  --  24.4* 23.9*  PLT 289   < > 303  --  303 320  HEPARINUNFRC  --   --   --   --  <0.10* 0.45  CREATININE 1.27*  --  1.14  --   --   --    < > = values in this interval not displayed.    Estimated Creatinine Clearance: 78.9 mL/min (by C-G formula based on SCr of 1.14 mg/dL).   Medications:  Scheduled:  . vitamin C  500 mg Oral Daily  . aspirin EC  81 mg Oral Daily  . atorvastatin  10 mg Oral QPM  . chlorhexidine  15 mL Mouth/Throat BID  . Chlorhexidine Gluconate Cloth  6 each Topical Daily  . chlorpheniramine-HYDROcodone  5 mL Oral Q12H  . docusate sodium  200 mg Oral BID  . feeding supplement (ENSURE ENLIVE)  237 mL Oral TID BM  . fluticasone  2 spray Each Nare BID  . insulin aspart  0-20 Units Subcutaneous TID WC  . insulin detemir  18 Units Subcutaneous Daily  . ipratropium-albuterol  3 mL Nebulization TID  . mouth rinse  15 mL Mouth Rinse BID  . Melatonin  3 mg Oral QHS  . metoprolol tartrate  50 mg Oral BID  . polyethylene glycol  17 g Oral Daily  . psyllium  1 packet Oral Daily  . senna-docusate  3 tablet Oral BID  . sodium chloride  2 spray Each Nare QID  . sodium chloride flush  10-40 mL Intracatheter Q12H  . sodium chloride flush  3 mL Intravenous Q12H  . zinc sulfate  220 mg Oral Daily   Infusions:  .  ceFAZolin (ANCEF) IV 2 g (12/12/19 0520)  . heparin 1,500 Units/hr (12/12/19 0525)     Assessment: 54 yo male with COVID pneumonitis started on Eliquis for PE, held for thoracentesis 1/12 and chest tube placement on 1/13.    Situation complicated by RLL hematoma, was taken off full dose lovenox 1/25 and placed on prophylaxis now back on IV heparin.  Hgb low but stable this am at 7.5, plt wnl. No overt bleeding noted. Heparin level at goal this am.    Goal of Therapy:  Heparin level 0.3-0.5 units/ml Monitor platelets by anticoagulation protocol: Yes   Plan:  -Decrease heparin to 1450 units/hr -Heparin level in 6 hours and daily wth CBC daily  2/25 PharmD., BCPS Clinical Pharmacist 12/12/2019 8:24 AM

## 2019-12-13 LAB — BASIC METABOLIC PANEL
Anion gap: 10 (ref 5–15)
BUN: 16 mg/dL (ref 6–20)
CO2: 29 mmol/L (ref 22–32)
Calcium: 7.4 mg/dL — ABNORMAL LOW (ref 8.9–10.3)
Chloride: 100 mmol/L (ref 98–111)
Creatinine, Ser: 1.02 mg/dL (ref 0.61–1.24)
GFR calc Af Amer: >60 mL/min (ref >60)
GFR calc non Af Amer: >60 mL/min (ref >60)
Glucose, Bld: 125 mg/dL — ABNORMAL HIGH (ref 70–99)
Potassium: 3.6 mmol/L (ref 3.5–5.1)
Sodium: 139 mmol/L (ref 135–145)

## 2019-12-13 LAB — CBC
HCT: 23.7 % — ABNORMAL LOW (ref 39.0–52.0)
Hemoglobin: 7.2 g/dL — ABNORMAL LOW (ref 13.0–17.0)
MCH: 27.2 pg (ref 26.0–34.0)
MCHC: 30.4 g/dL (ref 30.0–36.0)
MCV: 89.4 fL (ref 80.0–100.0)
Platelets: 360 10*3/uL (ref 150–400)
RBC: 2.65 MIL/uL — ABNORMAL LOW (ref 4.22–5.81)
RDW: 17.5 % — ABNORMAL HIGH (ref 11.5–15.5)
WBC: 10.5 10*3/uL (ref 4.0–10.5)
nRBC: 0 % (ref 0.0–0.2)

## 2019-12-13 LAB — GLUCOSE, CAPILLARY
Glucose-Capillary: 124 mg/dL — ABNORMAL HIGH (ref 70–99)
Glucose-Capillary: 150 mg/dL — ABNORMAL HIGH (ref 70–99)
Glucose-Capillary: 97 mg/dL (ref 70–99)
Glucose-Capillary: 122 mg/dL — ABNORMAL HIGH (ref 70–99)

## 2019-12-13 LAB — HEPARIN LEVEL (UNFRACTIONATED): Heparin Unfractionated: 0.3 IU/mL (ref 0.30–0.70)

## 2019-12-13 NOTE — Progress Notes (Signed)
PULMONARY / CRITICAL CARE MEDICINE   NAME:  Nathaniel Webb, MRN:  [id number], DOB:  04/28/1966, LOS: 32 ADMISSION DATE:  11/11/2019, CONSULTATION DATE:  1/12 REFERRING MD:  Joseph Art, CHIEF COMPLAINT:  Pleural effusion   BRIEF HISTORY:    Nathaniel Webb is a 54 year old male admitted for COVID-19 pneumonia. Hospital course complicated by pulmonary embolism, pleural effusion, hydropneumothorax and pneumomediastinum s/p chest tube, mechanical ventilation, BPF and deconditioning.  SIGNIFICANT PAST MEDICAL HISTORY    Past Medical History:  Diagnosis Date  . Diabetes mellitus without complication (HCC)   . Heel pain 06/09/2015  . High triglycerides 05/09/2016  . Hypertension   . Low HDL (under 40) 05/09/2016  . Obesity, Class II, BMI 35-39.9, with comorbidity (HCC) 01/04/2016   SIGNIFICANT EVENTS:  12/29: Admitted 1/12: PCCM consulted with performance of a thoracentesis 1/13 Tx to ICU with worsening pneumothorax 1/17 intubated 1/18 Continue to have R sided pneumothorax despite chest tube. 28 french placed and extubated that evening 1/19 small bore ct removed, getting lasix. Feeling better and stronger. airleak still brisk 1/20 O2 down to 4L, still desaturating. 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 Improved overal aeration. CVTS> Continue current Rx. ALI Resolved might be VATS Candidate eventually.  1/21 hematuria>Holding LMWH with reassessment 1/22 1/22: PT ordered, patient encouraged ambulation. Start trending CBC again to follow leukocytosis. Possible ct repositioning pending CBC results.  1/25: Low molecular weight heparin discontinued altogether given intraparenchymal hematoma identified on 1/24 via CT scan 1/28 Restarted anticoagulation with heparin high risk protocol  STUDIES:   CTA Chest 12/29 >> positive for PE with small volume embolus in the distal right pulmonary artery, lobar, and some segmental branches CT ABD/Pelvis  1/10 >> moderate stool burden in colon, R>L airspace disease compatible with PNA, loculated right pleural effusion CT Chest w/o 1/10 >> multiloculated right lung hydropneumothorax, larger volume of pleural fluid relative to pleural air, progression of multilobar right lung consolidation, small volume loculated right pneumomediastinum Thora1/12>> glucose <20, LD 2,447, protein 5.2, total nucleated cells 117,299, neutrophil 85 CT Chest w/o 1/13 >> limited exam by motion, large right hydropneumothorax with numerous thin internal septations with air fluid levels increased in size, complete right lung atelectasis, pneumomediastinum in the anterior right pericardial region, similar extensive patchy GGO on L 1/21 CT chest: IMPRESSION: 1. Decreased small to moderate right hydropneumothorax. 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 2. Improved aeration of the right lung with residual mild to moderate atelectasis. 3. Decreased pneumomediastinum in the anterior right pericardial region. 4. Extensive patchy ground-glass density and reticulation throughout the left and right lungs, improved from prior CT, remains consistent with COVID-19 pneumonia. 5. Aortic atherosclerosis 1/24 CT chest 1. Interval development of a 12 x 11 cm collection that appears to be centered within the right lower lobe parenchyma. This is suboptimally evaluated in the absence of IV contrast. Differential considerations include a lung abscess or hematoma or a combination of both. If there is clinical concern for active bleeding, a contrast enhanced CT of the chest timed as a PE study is recommended. 2. Persistent right-sided pneumothorax with a well-positioned right-sided chest tube. Overall, the size of the pneumothorax is relatively stable. 3. Small to moderate size right-sided pleural effusion. 4. Persistent diffuse bilateral airspace opacities similar to prior study. CULTURES:   1/10 Blood Culture: Staph Aureus. R Erythromycin, Clindamycin,  1/12 Body Pleural Fluid: Staph Aureus. R Erythromcyin, Clindamycin ANTIBIOTICS:  Remdesivir  12/27>1/1 Tocilizumab: 12/28> Cefepime: 1/10>>1/14 Linezolid 1/10>>1/14 Cefezolin 1/14>>  LINES/TUBES:  12/27 R Antecubital 1/18 Catheter  1/19 R Chest Tube 28 French   CONSULTANTS:  PCCM Cardiothoracic Surgery PMR  SUBJECTIVE:  Reports he still has general weakness and shortness of breath with exertion. Has productive cough with white sputum that is unchanged. Complains of LE edema. Still participating with physical therapy; has been up and able to walk a few steps today  CONSTITUTIONAL: BP 114/84   Pulse 87   Temp 98.4 F (36.9 C) (Oral)   Resp (!) 21   Ht 5\' 5"  (1.651 m)   Wt 91.7 kg   SpO2 100%   BMI 33.64 kg/m   I/O last 3 completed shifts: In: 225.3 [I.V.:125.3; IV Piggyback:100] Out: 2835 [Urine:2525; Chest Tube:310]     FiO2 (%):  [40 %] 40 %  Physical Exam: General: Chronically ill appearing, deconditioned male laying in bed, no acute distress HENT: [place], AT, OP clear, MMM Eyes: EOMI, no scleral icterus Respiratory: Tachypneic, bilateral rhonchi, squeaks and wheezes, right chest tube in place with persistent air leak Cardiovascular: RRR borderline sinus tachycardia -M/R/G, no JVD GI: BS+, soft, nontender Extremities: Non-pitting edema in lower extremities Neuro: AAO x4, CNII-XII grossly intact; conversant MAE on request Skin: Intact, no rashes or bruising Psych: Normal mood, normal affect,   RESOLVED PROBLEM LIST  Acute hypoxic respiratory failure 2/2 COVID PNA w/ ALI Hematuria  AKI ASSESSMENT AND PLAN    Acute hypoxemic respiratory failure. 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 Airleak remains persistent --Wean supplemental O2 for goal >88% --Continue chest tube to suction --Continue abx per  ID --Appreciate CTS input. Though VATS would be definitive therapy for BPF, his oxygenation and pneumonitis make patient high risk for surgery including prolonged mechanical ventilation. Consult team recommends conservative management, deferring VATS at this time. --Aggressive PT with OOB as tolerated --Scheduled duonebs --Repeat lasix again today. May need standing order.  RLL Hematoma in setting of PE. On reviewing imaging from 12/29 to now and it appears that after his COVID-19 pneumonia, his lung has suffered severe parenchymal destruction leading to cystic and bullae process that unfortunately has been slow to resolve and now has evidence of what is likely hematoma. My suspicion is low for abscess with improving clinical status and leukocytosis in setting of antibiotics. LE dopplers neg for DVT.  --Hg stable --Continue high risk heparin gtt protocol targeting low-end Xa levels --Trend CBC  MSSA empyema c/b bacteremia. Loculated pleural effusion.  ID following.  --Continue Cefazolin until 2/10.  --CTS deferring VATS as noted above until improved enough to tolerate surgery.  --Check Othopantogram.  Acute blood loss anemia  Hg stable. Anticoagulation has been on hold now for several days.  Did require transfusion on 23rd Plan  Serial CBCs. Heparin restarted 1/28.  Small Acute Pulmonary Embolism but no DVT noted on [place] 1/25 - 12/29 PE appreciated in the Right distal pulmonary artery  Plan Restarted anticoagulation 1/28  Hypertension:  Plan We will continue Lopressor 50 mg twice daily  Diabetes Mellitis:  Excellent glycemic control Plan Sliding scale and basal insulin to continue at current dosing  Constipation Plan Stool regimen ordered  Nasal congestion Plan Continuing nasal saline rinse and nasal steroids   Best Practice / Goals of Care / Disposition.   DVT PROPHYLAXIS: Lovenox/heparin SUP:NA NUTRITION:Dysphagia 3  MOBILITY: Bed rest, up with PT GOALS OF  CARE:FULL FAMILY DISCUSSIONS: Updated patient on plan DISPOSITION progressive  care, eventually CIR candidate, consult note from 21st says chest tubes need to be out  Gwynne Edinger, MD PhD  12/13/2019 2:13 PM

## 2019-12-13 NOTE — Progress Notes (Signed)
Occupational Therapy Treatment Patient Details Name: Nathaniel Webb MRN: [id number] DOB: Jan 17, 1966 Today's Date: 12/13/2019    History of present illness 54 y/o male w/ hx of Obesity, low HDL, HTN, DM, presented to [facility] (12/29) with approximately 1 week history of gradually worsening shortness of breath.  He was found to have acute hypoxic respiratory failure and was requiring high flow oxygen to maintain O2 saturations.  Chest x-ray was typical of Covid pneumonitis. Pt dx w/ COVID 12/23 and dc home w/ spouse. 1/10 CXR showed ARDS bilateral increasing opacification; respiratory status worsened; concerns for aspiration. Acute PE; loculated right pleural effusion; 1/11 CTS consult, recs chest tube. 1/12 CT chest with densely consolidated right lung, air fluid level/ hydropneumothorax. Thoracentesis 1/12; pleural fluid sampled and concerning for empyema.  1/13 emergently transferred to ICU for worsening hypoxia; CT showed worsening right hydropneumothorax with loculations in pleura. Emergent right chest tube placed. Pt intubated 11/30/19-12/01/19. Pt developed hematuria on 1/21.   OT comments  Pt making gradual progress towards OT goals this session. Session focus on increasing functional mobility and activity tolerance this session. Overall, pt requires MIN A - minguard for functional mobility with pt able to take a couple  steps towards sink and back to bed with RW. Pt RR continues to increase to as much as 46 with mobility with pt needing cues to slow breathing and incorporate PLB. Pt limited by coughing spells this session causing O2 to drop to lows 80s on 4L HFNC. Increased O2 to 5L during coughing spells but able to wean down to 4L by end of session. DC plan currently remains appropriate. Will continue to follow acutely per POC.    Follow Up Recommendations  CIR;Supervision/Assistance - 24 hour    Equipment Recommendations  None recommended by OT    Recommendations for Other Services       Precautions / Restrictions Precautions Precautions: Fall;Other (comment) Precaution Comments: R sided Chest tube, monitor SpO2 and RR Restrictions Weight Bearing Restrictions: No       Mobility Bed Mobility Overal bed mobility: Needs Assistance Bed Mobility: Supine to Sit;Sit to Supine     Supine to sit: Supervision;HOB elevated Sit to supine: Supervision;HOB elevated   General bed mobility comments: use of bed rail to pull to edge of bed  Transfers Overall transfer level: Needs assistance Equipment used: Rolling walker (2 wheeled) Transfers: Sit to/from Stand Sit to Stand: Min assist         General transfer comment: light MIN A to power into standing d/t coughing spells. able to stand and take a couple steps this session with close min guard for lines and safety    Balance Overall balance assessment: Needs assistance Sitting-balance support: Single extremity supported;Feet supported;No upper extremity supported Sitting balance-Leahy Scale: Fair Sitting balance - Comments: pt able to sit EOB ~ 10 minutes with no LOB   Standing balance support: Bilateral upper extremity supported Standing balance-Leahy Scale: Poor Standing balance comment: reliant on BUE support and light min guard for safety                           ADL either performed or assessed with clinical judgement   ADL Overall ADL's : Needs assistance/impaired                                     Functional mobility during ADLs: Min guard;Minimal  assistance;Rolling walker General ADL Comments: session focused on functional mobility progression and relaxation technique education     Vision       Perception     Praxis      Cognition Arousal/Alertness: Awake/alert Behavior During Therapy: Anxious Overall Cognitive Status: Within Functional Limits for tasks assessed                                 General Comments: very pleasant and motivated but limited  by anxiety        Exercises     Shoulder Instructions       General Comments pt on 4L HFNC upon arrival, desat to lows 80s during coughing. bumped up to 5L with sats >90%. Pt RR continues to increase with mobility to as much as 46. Continued cues to slow breathing. Read some of pts bible out loud this session( pt doesn't have his glasses) and that seemed to calm him down some. able to wean pt back down to 4L HFNC by end of session    Pertinent Vitals/ Pain       Pain Assessment: Faces Faces Pain Scale: Hurts little more Pain Location: during coughing spells Pain Descriptors / Indicators: Discomfort Pain Intervention(s): Monitored during session  Home Living                                          Prior Functioning/Environment              Frequency  Min 3X/week        Progress Toward Goals  OT Goals(current goals can now be found in the care plan section)  Progress towards OT goals: Progressing toward goals  Acute Rehab OT Goals Patient Stated Goal: to go home Time For Goal Achievement: 12/17/19 Potential to Achieve Goals: Good  Plan Discharge plan remains appropriate    Co-evaluation                 AM-PAC OT "6 Clicks" Daily Activity     Outcome Measure   Help from another person eating meals?: A Little Help from another person taking care of personal grooming?: A Little Help from another person toileting, which includes using toliet, bedpan, or urinal?: A Little Help from another person bathing (including washing, rinsing, drying)?: A Lot Help from another person to put on and taking off regular upper body clothing?: A Little Help from another person to put on and taking off regular lower body clothing?: A Lot 6 Click Score: 16    End of Session Equipment Utilized During Treatment: Rolling walker;Oxygen;Other (comment)(4L- 5L HFNC)  OT Visit Diagnosis: Unsteadiness on feet (R26.81);Muscle weakness (generalized) (M62.81)    Activity Tolerance Patient tolerated treatment well   Patient Left in bed;with call bell/phone within reach   Nurse Communication Mobility status;Other (comment)(requesting cough meds; RR increase to 46)        Time: 1010-1045 OT Time Calculation (min): 35 min  Charges: OT General Charges $OT Visit: 1 Visit OT Treatments $Therapeutic Activity: 23-37 mins  Audery Amel., COTA/L Acute Rehabilitation Services [phone]    Angelina Pih 12/13/2019, 1:37 PM

## 2019-12-13 NOTE — Progress Notes (Signed)
ANTICOAGULATION CONSULT NOTE - Follow Up Consult  Pharmacy Consult for heparin  Indication: PE  No Known Allergies  Patient Measurements: Height: 5\' 5"  (165.1 cm) Weight: 202 lb 2.6 oz (91.7 kg) IBW/kg (Calculated) : 61.5 Heparin Dosing Weight: 85 kg  Vital Signs: Temp: 98.1 F (36.7 C) (01/30 0420) Temp Source: Oral (01/30 0420) BP: 122/77 (01/30 0420) Pulse Rate: 80 (01/30 0420)  Labs: Recent Labs    12/11/19 0441 12/11/19 0441 12/11/19 1951 12/11/19 1951 12/12/19 0730 12/12/19 1400 12/13/19 0308  HGB 7.2*   < > 7.6*   < > 7.5*  --  7.2*  HCT 23.8*   < > 24.4*  --  23.9*  --  23.7*  PLT 303   < > 303  --  320  --  360  HEPARINUNFRC  --   --  <0.10*   < > 0.45 0.40 0.30  CREATININE 1.14  --   --   --  0.99  --  1.02   < > = values in this interval not displayed.    Estimated Creatinine Clearance: 88.2 mL/min (by C-G formula based on SCr of 1.02 mg/dL).   Medications:  Scheduled:  . vitamin C  500 mg Oral Daily  . aspirin EC  81 mg Oral Daily  . atorvastatin  10 mg Oral QPM  . chlorhexidine  15 mL Mouth/Throat BID  . Chlorhexidine Gluconate Cloth  6 each Topical Daily  . chlorpheniramine-HYDROcodone  5 mL Oral Q12H  . docusate sodium  200 mg Oral BID  . feeding supplement (ENSURE ENLIVE)  237 mL Oral TID BM  . fluticasone  2 spray Each Nare BID  . insulin aspart  0-20 Units Subcutaneous TID WC  . insulin detemir  18 Units Subcutaneous Daily  . ipratropium-albuterol  3 mL Nebulization TID  . mouth rinse  15 mL Mouth Rinse BID  . Melatonin  3 mg Oral QHS  . metoprolol tartrate  50 mg Oral BID  . polyethylene glycol  17 g Oral Daily  . psyllium  1 packet Oral Daily  . senna-docusate  3 tablet Oral BID  . sodium chloride  2 spray Each Nare QID  . sodium chloride flush  10-40 mL Intracatheter Q12H  . sodium chloride flush  3 mL Intravenous Q12H  . zinc sulfate  220 mg Oral Daily   Infusions:  .  ceFAZolin (ANCEF) IV 2 g (12/13/19 0555)  . heparin 1,450  Units/hr (12/12/19 2223)    Assessment: 54 yo male with COVID pneumonitis started on Eliquis for PE, held for thoracentesis 1/12 and chest tube placement on 1/13.    Situation complicated by RLL hematoma, was taken off full dose lovenox 1/25 and placed on prophylaxis now back on IV heparin.  Hgb low but stable this am at 7.2, plt wnl. No overt bleeding noted. Heparin level continues to be at goal but on lowest end of therapeutic. Will increase slightly to maintain therapeutic level.   Goal of Therapy:  Heparin level 0.3-0.5 units/ml Monitor platelets by anticoagulation protocol: Yes   Plan:  -Increase heparin to 1500 units/hr -Heparin level daily wth CBC daily -Monitor bleeding closely  2/25, PharmD PGY1 Pharmacy Resident Phone: [phone] 12/13/2019  7:48 AM  Please check [email or web address] for unit-specific pharmacy phone numbers.

## 2019-12-14 LAB — GLUCOSE, CAPILLARY
Glucose-Capillary: 111 mg/dL — ABNORMAL HIGH (ref 70–99)
Glucose-Capillary: 104 mg/dL — ABNORMAL HIGH (ref 70–99)
Glucose-Capillary: 124 mg/dL — ABNORMAL HIGH (ref 70–99)
Glucose-Capillary: 114 mg/dL — ABNORMAL HIGH (ref 70–99)

## 2019-12-14 LAB — CBC
HCT: 23.4 % — ABNORMAL LOW (ref 39.0–52.0)
Hemoglobin: 7.2 g/dL — ABNORMAL LOW (ref 13.0–17.0)
MCH: 26.9 pg (ref 26.0–34.0)
MCHC: 30.8 g/dL (ref 30.0–36.0)
MCV: 87.3 fL (ref 80.0–100.0)
Platelets: 363 10*3/uL (ref 150–400)
RBC: 2.68 MIL/uL — ABNORMAL LOW (ref 4.22–5.81)
RDW: 17.1 % — ABNORMAL HIGH (ref 11.5–15.5)
WBC: 10.8 10*3/uL — ABNORMAL HIGH (ref 4.0–10.5)
nRBC: 0 % (ref 0.0–0.2)

## 2019-12-14 LAB — BASIC METABOLIC PANEL
Anion gap: 9 (ref 5–15)
BUN: 13 mg/dL (ref 6–20)
CO2: 29 mmol/L (ref 22–32)
Calcium: 7.7 mg/dL — ABNORMAL LOW (ref 8.9–10.3)
Chloride: 102 mmol/L (ref 98–111)
Creatinine, Ser: 0.91 mg/dL (ref 0.61–1.24)
GFR calc Af Amer: >60 mL/min (ref >60)
GFR calc non Af Amer: >60 mL/min (ref >60)
Glucose, Bld: 123 mg/dL — ABNORMAL HIGH (ref 70–99)
Potassium: 3.9 mmol/L (ref 3.5–5.1)
Sodium: 140 mmol/L (ref 135–145)

## 2019-12-14 LAB — HEPARIN LEVEL (UNFRACTIONATED): Heparin Unfractionated: 0.36 IU/mL (ref 0.30–0.70)

## 2019-12-14 NOTE — Progress Notes (Addendum)
PULMONARY / CRITICAL CARE MEDICINE   NAME:  Nathaniel Webb, MRN:  [id number], DOB:  [birth year], LOS: 33 ADMISSION DATE:  11/11/2019, CONSULTATION DATE:  1/12 REFERRING MD:  Joseph Art, CHIEF COMPLAINT:  Pleural effusion   BRIEF HISTORY:    Nathaniel Webb is a 54 year old male admitted for COVID-19 pneumonia. Hospital course complicated by pulmonary embolism, pleural effusion, hydropneumothorax and pneumomediastinum s/p chest tube, mechanical ventilation, BPF and deconditioning.  SIGNIFICANT PAST MEDICAL HISTORY    Past Medical History:  Diagnosis Date  . Diabetes mellitus without complication (HCC)   . Heel pain 06/09/2015  . High triglycerides 05/09/2016  . Hypertension   . Low HDL (under 40) 05/09/2016  . Obesity, Class II, BMI 35-39.9, with comorbidity (HCC) 01/04/2016   SIGNIFICANT EVENTS:  12/29: Admitted 1/12: PCCM consulted with performance of a thoracentesis 1/13 Tx to ICU with worsening pneumothorax 1/17 intubated 1/18 Continue to have R sided pneumothorax despite chest tube. 28 french placed and extubated that evening 1/19 small bore ct removed, getting lasix. Feeling better and stronger. airleak still brisk 1/20 O2 down to 4L, still desaturating. 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 Improved overal aeration. CVTS> Continue current Rx. ALI Resolved might be VATS Candidate eventually.  1/21 hematuria>Holding LMWH with reassessment 1/22 1/22: PT ordered, patient encouraged ambulation. Start trending CBC again to follow leukocytosis. Possible ct repositioning pending CBC results.  1/25: Low molecular weight heparin discontinued altogether given intraparenchymal hematoma identified on 1/24 via CT scan 1/28 Restarted anticoagulation with heparin high risk protocol  STUDIES:   CTA Chest 12/29 >> positive for PE with small volume embolus in the distal right pulmonary artery, lobar, and some segmental branches CT ABD/Pelvis  1/10 >> moderate stool burden in colon, R>L airspace disease compatible with PNA, loculated right pleural effusion CT Chest w/o 1/10 >> multiloculated right lung hydropneumothorax, larger volume of pleural fluid relative to pleural air, progression of multilobar right lung consolidation, small volume loculated right pneumomediastinum Thora1/12>> glucose <20, LD 2,447, protein 5.2, total nucleated cells 117,299, neutrophil 85 CT Chest w/o 1/13 >> limited exam by motion, large right hydropneumothorax with numerous thin internal septations with air fluid levels increased in size, complete right lung atelectasis, pneumomediastinum in the anterior right pericardial region, similar extensive patchy GGO on L 1/21 CT chest > Decreased small to moderate right hydropneumothorax. 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 Improved aeration of the right lung with residual mild to moderate atelectasis.Decreased pneumomediastinum in the anterior right pericardial region. 1/24 CT chest >Interval development of a 12 x 11 cm collection that appears to be centered within the right lower lobe parenchyma. This is suboptimally evaluated in the absence of IV contrast. Differential considerations include a lung abscess or hematoma or a combination of both  CULTURES:  1/10 Blood Culture: Staph Aureus. R Erythromycin, Clindamycin,  1/12 Body Pleural Fluid: Staph Aureus. R Erythromcyin, Clindamycin ANTIBIOTICS:  Remdesivir 12/27>1/1 Tocilizumab: 12/28> Cefepime: 1/10>>1/14 Linezolid 1/10>>1/14 Cefezolin 1/14>>  LINES/TUBES:  Right single lumen midline 1/24 Right 14 fr chest tube 1/13 > 1/19 Right 54fr chest tube 1/18  CONSULTANTS:  PCCM Cardiothoracic Surgery PMR  SUBJECTIVE:  Sitting up in bed with reported intermittent pain and chest tube insertion site. Continue productive cough, sputum is reported as while and unchanged.  CONSTITUTIONAL: BP 126/85 (BP Location: Left  Arm)   Pulse (!) 52   Temp 99 F (37.2 C) (Oral)   Resp 20  Ht 5\' 5"  (1.651 m)   Wt 91.7 kg   SpO2 100%   BMI 33.64 kg/m   I/O last 3 completed shifts: In: 360 [P.O.:360] Out: 1405 [Urine:1175; Chest Tube:230]     FiO2 (%):  [40 %] 40 %  Physical Exam: General: Chronically ill appearing elderly deconditioned male, in NAD HEENT: [place]/AT, MM pink/moist, PERRL, poor dentition  Neuro: Alert and oriented x3,. Non-focal  CV: s1s2 regular rate and rhythm, no murmur, rubs, or gallops,  PULM: Mild tachypnea, bilateral rhonchi R > L with associated wheezing, Right chest tube in place with persistent large air leak  GI: soft, bowel sounds active in all 4 quadrants, non-tender, non-distended Extremities: warm/dry, no edema  Skin: no rashes or lesions  RESOLVED PROBLEM LIST  Acute hypoxic respiratory failure 2/2 COVID PNA w/ ALI Hematuria  AKI ASSESSMENT AND PLAN    Acute hypoxemic respiratory failure secondary to COVID pneumonia c/b MSSA PNA, empyema and PTX Bronchopleural Fistula  Prolonged nee of chest tube  -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 Airleak remains persistent -Appreciate CTS input. Though VATS would be definitive therapy for BPF, his oxygenation and pneumonitis make patient high risk for surgery including prolonged mechanical ventilation. Consult team recommends conservative management, deferring VATS at this time. P: Continue to wean supplemental oxygen as able  Spo2 goal >88% Antibiotics per ID Aggressive PT/OT as able  Scheduled DuoNebs  Daily evaluation for need of diuretics Continue chest tube ti -30 suction  Aggressive pulmonay hygiene   RLL Hematoma in setting of PE -On reviewing imaging from 12/29 to now and it appears that after his COVID-19 pneumonia, his lung has suffered severe parenchymal destruction leading to cystic and bullae process that unfortunately has  been slow to resolve and has evidence of what is likely hematoma. Suspicion is low for abscess with improving clinical status and leukocytosis in setting of antibiotics.  -LE dopplers neg for DVT.  P: Hgb remains low but stable Continue high risk heparin drip protocol targeting low-end Xa levels  Pharmacy to assist in dosing hepain drip  Trend CBC  MSSA empyema c/b bacteremia Loculated pleural effusion P: ID following  Continue IV Cefazolin until 2/10 Defered VATS as above  Orhtopantogram pending   Acute blood loss anemia due to RLL hematoma  -Hg stable. Did require transfusion on 23rd P: Trend CBC Anticoagulation plan as above   Small Acute Pulmonary Embolism but no DVT noted on [place] 1/25 - 12/29 PE appreciated in the Right distal pulmonary artery  P:  Anticoagulation as above  Pulmonary support as above   Hypertension:  P: Well controlled on scheduled Lopressor, continue   Diabetes Mellitis:  -Excellent glycemic control P: Continue current glucemic regiment with SSI and basal insulin   Constipation P: Stool regiment in place   Nasal congestion P: Nasal saline and nasal steroids ordered    Best Practice / Goals of Care / Disposition.   DVT PROPHYLAXIS: Lovenox/heparin SUP:NA NUTRITION:Dysphagia 3  MOBILITY: Bed rest, up with PT GOALS OF CARE:FULL FAMILY DISCUSSIONS: Updated patient on plan DISPOSITION progressive care, eventually CIR candidate, consult note from 21st says chest tubes need to be out   Johnsie Cancel, NP-C [facility] Contact / Pager information can be found on Amion  12/14/2019, 11:09 AM

## 2019-12-14 NOTE — Progress Notes (Signed)
ANTICOAGULATION CONSULT NOTE - Follow Up Consult  Pharmacy Consult for heparin  Indication: PE  No Known Allergies  Patient Measurements: Height: 5\' 5"  (165.1 cm) Weight: 202 lb 2.6 oz (91.7 kg) IBW/kg (Calculated) : 61.5 Heparin Dosing Weight: 85 kg  Vital Signs: Temp: 99 F (37.2 C) (01/31 0738) Temp Source: Oral (01/31 0738) BP: 126/85 (01/31 0738) Pulse Rate: 52 (01/31 0738)  Labs: Recent Labs    12/12/19 0730 12/12/19 0730 12/12/19 1400 12/13/19 0308 12/14/19 0319  HGB 7.5*   < >  --  7.2* 7.2*  HCT 23.9*  --   --  23.7* 23.4*  PLT 320  --   --  360 363  HEPARINUNFRC 0.45   < > 0.40 0.30 0.36  CREATININE 0.99  --   --  1.02 0.91   < > = values in this interval not displayed.    Estimated Creatinine Clearance: 98.9 mL/min (by C-G formula based on SCr of 0.91 mg/dL).   Medications:  Scheduled:  . vitamin C  500 mg Oral Daily  . aspirin EC  81 mg Oral Daily  . atorvastatin  10 mg Oral QPM  . chlorhexidine  15 mL Mouth/Throat BID  . Chlorhexidine Gluconate Cloth  6 each Topical Daily  . chlorpheniramine-HYDROcodone  5 mL Oral Q12H  . docusate sodium  200 mg Oral BID  . feeding supplement (ENSURE ENLIVE)  237 mL Oral TID BM  . fluticasone  2 spray Each Nare BID  . insulin aspart  0-20 Units Subcutaneous TID WC  . insulin detemir  18 Units Subcutaneous Daily  . ipratropium-albuterol  3 mL Nebulization TID  . mouth rinse  15 mL Mouth Rinse BID  . Melatonin  3 mg Oral QHS  . metoprolol tartrate  50 mg Oral BID  . polyethylene glycol  17 g Oral Daily  . psyllium  1 packet Oral Daily  . senna-docusate  3 tablet Oral BID  . sodium chloride  2 spray Each Nare QID  . sodium chloride flush  10-40 mL Intracatheter Q12H  . sodium chloride flush  3 mL Intravenous Q12H  . zinc sulfate  220 mg Oral Daily   Infusions:  .  ceFAZolin (ANCEF) IV 2 g (12/14/19 12/16/19)  . heparin 1,500 Units/hr (12/13/19 12/15/19)    Assessment: 54 yo male with COVID pneumonitis started on  Eliquis for PE, held for thoracentesis 1/12 and chest tube placement on 1/13.  Situation complicated by RLL hematoma, was taken off full dose lovenox 1/25 and placed on prophylaxis now back on IV heparin.  Heparin level (0.36) therapeutic on drip rate 1500 units/hr. Hgb low but stable, plts wnl. No overt bleeding or infusion issues noted. Will continue current rate.   Goal of Therapy:  Heparin level 0.3-0.5 units/ml Monitor platelets by anticoagulation protocol: Yes   Plan:  -Continue heparin IV at 1500 units/hr -Daily heparin level and CBC -Monitor bleeding closely  2/25, PharmD PGY1 Pharmacy Resident Phone: [phone] 12/14/2019  9:21 AM  Please check [email or web address] for unit-specific pharmacy phone numbers.

## 2019-12-15 ENCOUNTER — Inpatient Hospital Stay (HOSPITAL_COMMUNITY): Payer: Medicaid Other

## 2019-12-15 DIAGNOSIS — J869 Pyothorax without fistula: Secondary | ICD-10-CM

## 2019-12-15 LAB — CBC
HCT: 24.1 % — ABNORMAL LOW (ref 39.0–52.0)
Hemoglobin: 7.2 g/dL — ABNORMAL LOW (ref 13.0–17.0)
MCH: 27.1 pg (ref 26.0–34.0)
MCHC: 29.9 g/dL — ABNORMAL LOW (ref 30.0–36.0)
MCV: 90.6 fL (ref 80.0–100.0)
Platelets: 390 10*3/uL (ref 150–400)
RBC: 2.66 MIL/uL — ABNORMAL LOW (ref 4.22–5.81)
RDW: 17.1 % — ABNORMAL HIGH (ref 11.5–15.5)
WBC: 10.4 10*3/uL (ref 4.0–10.5)
nRBC: 0 % (ref 0.0–0.2)

## 2019-12-15 LAB — GLUCOSE, CAPILLARY
Glucose-Capillary: 112 mg/dL — ABNORMAL HIGH (ref 70–99)
Glucose-Capillary: 127 mg/dL — ABNORMAL HIGH (ref 70–99)
Glucose-Capillary: 126 mg/dL — ABNORMAL HIGH (ref 70–99)
Glucose-Capillary: 122 mg/dL — ABNORMAL HIGH (ref 70–99)

## 2019-12-15 LAB — BASIC METABOLIC PANEL
Anion gap: 12 (ref 5–15)
BUN: 11 mg/dL (ref 6–20)
CO2: 27 mmol/L (ref 22–32)
Calcium: 8 mg/dL — ABNORMAL LOW (ref 8.9–10.3)
Chloride: 102 mmol/L (ref 98–111)
Creatinine, Ser: 0.92 mg/dL (ref 0.61–1.24)
GFR calc Af Amer: >60 mL/min (ref >60)
GFR calc non Af Amer: >60 mL/min (ref >60)
Glucose, Bld: 102 mg/dL — ABNORMAL HIGH (ref 70–99)
Potassium: 4.3 mmol/L (ref 3.5–5.1)
Sodium: 141 mmol/L (ref 135–145)

## 2019-12-15 LAB — HEPARIN LEVEL (UNFRACTIONATED)
Heparin Unfractionated: 0.28 IU/mL — ABNORMAL LOW (ref 0.30–0.70)
Heparin Unfractionated: 0.37 IU/mL (ref 0.30–0.70)

## 2019-12-15 NOTE — Progress Notes (Signed)
[facility]  Date of Admission:  11/11/2019      Total days of antibiotics 34   Day 18 Cefazolin            ASSESSMENT: Nathaniel Webb is a 54 y.o. male s/p COVID pneumonia (12/27 - 01/01 Remdesivir, Tocilizumab) now with post-viral MSSA empyema complicated by pneumothorax and pneumonitis and secondary MSSA bacteremia.   Pleural fluid + MSSA, TTE was done and negative. He is awaiting timing of VATS for treatment of pleural space. CXR today with persistent rounded density in the right lower lobe (likely reflecting hematoma). Persistent loculated pneumothorax and R pleural effusion.   Would continue to treat with IV cefazolin through February 10th for 4 weeks of therapy.   Please call back should there be a change in condition or surgical plan to help determine further treatment needs.    PLAN: 1. Continue Cefazolin through February 10th    Principal Problem:   Empyema lung (HCC) Active Problems:   MSSA bacteremia   Hydropneumothorax   History of COVID-19 pneumonia   Type 2 diabetes, controlled, with peripheral neuropathy (HCC)   Hypertension   Morbid obesity (HCC)   Acute respiratory failure with hypoxemia (HCC)   Diabetes mellitus type 2, uncontrolled, with complications (HCC)   Acute pulmonary embolus (HCC)   Demand ischemia (HCC)   Constipation   ARDS (adult respiratory distress syndrome) (HCC)   Chest tube in place   . vitamin C  500 mg Oral Daily  . aspirin EC  81 mg Oral Daily  . atorvastatin  10 mg Oral QPM  . chlorhexidine  15 mL Mouth/Throat BID  . Chlorhexidine Gluconate Cloth  6 each Topical Daily  . chlorpheniramine-HYDROcodone  5 mL Oral Q12H  . docusate sodium  200 mg Oral BID  . feeding supplement (ENSURE ENLIVE)  237 mL Oral TID BM  . fluticasone  2 spray Each Nare BID  . insulin aspart  0-20 Units Subcutaneous TID WC  . insulin detemir  18 Units Subcutaneous Daily  . ipratropium-albuterol  3 mL Nebulization  TID  . mouth rinse  15 mL Mouth Rinse BID  . Melatonin  3 mg Oral QHS  . metoprolol tartrate  50 mg Oral BID  . polyethylene glycol  17 g Oral Daily  . psyllium  1 packet Oral Daily  . senna-docusate  3 tablet Oral BID  . sodium chloride  2 spray Each Nare QID  . sodium chloride flush  10-40 mL Intracatheter Q12H  . sodium chloride flush  3 mL Intravenous Q12H  . zinc sulfate  220 mg Oral Daily    SUBJECTIVE: Feeling dry but better. Spent a majority of the day up in the chair yesterday.  No fevers and WBC normalized. He is now participating in PT.  Being evaluated by CIR however needs CT's removed prior to acceptance.  TCTS following for timing of VATS to repair bronchopleural fistula.    Review of Systems: Review of Systems  Constitutional: Negative for chills, fever and malaise/fatigue.  Respiratory: Positive for cough and shortness of breath. Negative for sputum production.   Cardiovascular: Negative for chest pain.  Genitourinary: Negative for dysuria.  Musculoskeletal: Negative for back pain and joint pain.  Skin: Negative for itching and rash.  Neurological: Negative for dizziness and headaches.    No Known Allergies  OBJECTIVE: Vitals:   12/15/19 0756 12/15/19 0900 12/15/19 0909 12/15/19 1101  BP: (!) 109/91   Marland Kitchen)  115/98  Pulse: 92 95  99  Resp: (!) 29 (!) 21  (!) 26  Temp: 97.7 F (36.5 C)   97.7 F (36.5 C)  TempSrc: Oral   Oral  SpO2: 95% 96% 100% 94%  Weight:      Height:       Body mass index is 33.64 kg/m.  Physical Exam Constitutional:      Appearance: Normal appearance.     Comments: Sitting upright in bed.   Cardiovascular:     Rate and Rhythm: Normal rate and regular rhythm.  Pulmonary:     Breath sounds: Rhonchi present. No wheezing.     Comments: Dull RLL. 2.5 LPM nasal cannula. Tachypneic at times. Chest tube with persistent air leak on 30cm suction.  Abdominal:     General: Bowel sounds are normal. There is no distension.  Skin:     General: Skin is warm and dry.     Capillary Refill: Capillary refill takes less than 2 seconds.  Neurological:     Mental Status: He is alert and oriented to person, place, and time.     Lab Results Lab Results  Component Value Date   WBC 10.4 12/15/2019   HGB 7.2 (L) 12/15/2019   HCT 24.1 (L) 12/15/2019   MCV 90.6 12/15/2019   PLT 390 12/15/2019    Lab Results  Component Value Date   CREATININE 0.92 12/15/2019   BUN 11 12/15/2019   NA 141 12/15/2019   K 4.3 12/15/2019   CL 102 12/15/2019   CO2 27 12/15/2019    Lab Results  Component Value Date   ALT 133 (H) 12/03/2019   AST 73 (H) 12/03/2019   ALKPHOS 159 (H) 12/03/2019   BILITOT 0.5 12/03/2019     Microbiology: No results found for this or any previous visit (from the past 240 hour(s)).   Janene Madeira, MSN, NP-C [facility] [email or web address] Pager: [phone] Office: [phone] [place]: [phone]

## 2019-12-15 NOTE — Progress Notes (Signed)
Occupational Therapy Treatment Patient Details Name: Nathaniel Webb MRN: [id number] DOB: 1966/05/23 Today's Date: 12/15/2019    History of present illness 54 y/o male w/ hx of Obesity, low HDL, HTN, DM, presented to [facility] (12/29) with approximately 1 week history of gradually worsening shortness of breath.  He was found to have acute hypoxic respiratory failure and was requiring high flow oxygen to maintain O2 saturations.  Chest x-ray was typical of Covid pneumonitis. Pt dx w/ COVID 12/23 and dc home w/ spouse. 1/10 CXR showed ARDS bilateral increasing opacification; respiratory status worsened; concerns for aspiration. Acute PE; loculated right pleural effusion; 1/11 CTS consult, recs chest tube. 1/12 CT chest with densely consolidated right lung, air fluid level/ hydropneumothorax. Thoracentesis 1/12; pleural fluid sampled and concerning for empyema.  1/13 emergently transferred to ICU for worsening hypoxia; CT showed worsening right hydropneumothorax with loculations in pleura. Emergent right chest tube placed. Pt intubated 11/30/19-12/01/19. Pt developed hematuria on 1/21.   OT comments  Pt making good progress toward OT goals this session. Pt with cough throughout session, resulting in desaturations to 88%, requiring cueing and demo for breathing techniques. Pt with high RR, >35 throughout session. Pt completed UB ADLs with set up assist, mod A for LB ADLs. Pt demo'ed improvement with standing balance and transfers despite fatigue. Continue OT POC.    Follow Up Recommendations  CIR;Supervision/Assistance - 24 hour    Equipment Recommendations  None recommended by OT    Recommendations for Other Services      Precautions / Restrictions Precautions Precautions: Fall;Other (comment) Precaution Comments: R sided Chest tube, monitor SpO2 and RR Restrictions Weight Bearing Restrictions: No       Mobility Bed Mobility                  Transfers Overall transfer level: Needs  assistance Equipment used: Rolling walker (2 wheeled) Transfers: Sit to/from Stand Sit to Stand: Min guard;From elevated surface         General transfer comment: min guard to stand    Balance Overall balance assessment: Needs assistance Sitting-balance support: Single extremity supported;Feet supported;No upper extremity supported Sitting balance-Leahy Scale: Fair     Standing balance support: Bilateral upper extremity supported Standing balance-Leahy Scale: Poor Standing balance comment: reliant on BUE support and light min guard for safety                           ADL either performed or assessed with clinical judgement   ADL Overall ADL's : Needs assistance/impaired     Grooming: Oral care;Wash/dry hands;Wash/dry face;Applying deodorant;Set up;Sitting   Upper Body Bathing: Supervision/ safety;Sitting   Lower Body Bathing: Moderate assistance;Sit to/from stand;Cueing for safety;With adaptive equipment   Upper Body Dressing : Set up;Supervision/safety;Sitting   Lower Body Dressing: Moderate assistance;Sit to/from stand Lower Body Dressing Details (indicate cue type and reason): to don socks 2/2 fatigue             Functional mobility during ADLs: Min guard;Rolling walker                 Cognition Arousal/Alertness: Awake/alert Behavior During Therapy: WFL for tasks assessed/performed Overall Cognitive Status: Within Functional Limits for tasks assessed                                 General Comments: Very pleasant and motivated  General Comments Pt on 2LNC throughout session with occasional desaturations to ~85%    Pertinent Vitals/ Pain       Pain Assessment: 0-10 Pain Score: 4  Pain Location: during coughing spells Pain Descriptors / Indicators: Discomfort Pain Intervention(s): Limited activity within patient's tolerance;Monitored during session         Frequency  Min 3X/week        Progress  Toward Goals  OT Goals(current goals can now be found in the care plan section)  Progress towards OT goals: Progressing toward goals  Acute Rehab OT Goals Patient Stated Goal: to go home Time For Goal Achievement: 12/22/19 Potential to Achieve Goals: Good  Plan Discharge plan remains appropriate       AM-PAC OT "6 Clicks" Daily Activity     Outcome Measure   Help from another person eating meals?: None Help from another person taking care of personal grooming?: A Little Help from another person toileting, which includes using toliet, bedpan, or urinal?: A Lot Help from another person bathing (including washing, rinsing, drying)?: A Little Help from another person to put on and taking off regular upper body clothing?: A Little Help from another person to put on and taking off regular lower body clothing?: A Lot 6 Click Score: 17    End of Session Equipment Utilized During Treatment: Rolling walker;Oxygen  OT Visit Diagnosis: Unsteadiness on feet (R26.81);Muscle weakness (generalized) (M62.81)   Activity Tolerance Patient tolerated treatment well   Patient Left in chair;with call bell/phone within reach   Nurse Communication Mobility status        Time: 5537-4827 OT Time Calculation (min): 41 min  Charges: OT General Charges $OT Visit: 1 Visit OT Treatments $Self Care/Home Management : 38-52 mins   Crissie Reese OTR/L  12/15/2019, 12:09 PM

## 2019-12-15 NOTE — Progress Notes (Addendum)
Physical Therapy Treatment Patient Details Name: Nathaniel Webb MRN: [id number] DOB: 1966/04/09 Today's Date: 12/15/2019    History of Present Illness 54 y/o male w/ hx of Obesity, low HDL, HTN, DM, presented to [facility] (12/29) with approximately 1 week history of gradually worsening shortness of breath.  He was found to have acute hypoxic respiratory failure and was requiring high flow oxygen to maintain O2 saturations.  Chest x-ray was typical of Covid pneumonitis. Pt dx w/ COVID 12/23 and dc home w/ spouse. 1/10 CXR showed ARDS bilateral increasing opacification; respiratory status worsened; concerns for aspiration. Acute PE; loculated right pleural effusion; 1/11 CTS consult, recs chest tube. 1/12 CT chest with densely consolidated right lung, air fluid level/ hydropneumothorax. Thoracentesis 1/12; pleural fluid sampled and concerning for empyema.  1/13 emergently transferred to ICU for worsening hypoxia; CT showed worsening right hydropneumothorax with loculations in pleura. Emergent right chest tube placed. Pt intubated 11/30/19-12/01/19. Pt developed hematuria on 1/21.    PT Comments    Patient progressing slowly towards PT goals. Increased time for all mobility and with transitions due to coughing spells affecting Sp02 and RR. Pt with short shallow breaths and easily worked up with minimal activity. Tolerated short distance ambulation with close Min guard for safety. Limited by fatigue, respiratory status and weakness. Sp02 stayed >85% but needed to increase supplemental oxygen from 3-6L/min 02 [place] for activity. RR up to 45. Pt with productive cough. Utilized relaxation techniques to attempt to ease anxiety and lower RR. Motivated to return to PLOF. Will follow.   Follow Up Recommendations  CIR     Equipment Recommendations  Rolling walker with 5" wheels;3in1 (PT)    Recommendations for Other Services       Precautions / Restrictions Precautions Precautions: Fall;Other  (comment) Precaution Comments: Rt sided Chest tube, monitor SpO2 and RR Restrictions Weight Bearing Restrictions: No    Mobility  Bed Mobility Overal bed mobility: Needs Assistance Bed Mobility: Supine to Sit     Supine to sit: Supervision;HOB elevated     General bed mobility comments: Able to get to EOB with use of rail, no assist. Dyspneic once sitting EOB with increased RR.  Transfers Overall transfer level: Needs assistance Equipment used: Rolling walker (2 wheeled) Transfers: Sit to/from Stand Sit to Stand: Min guard         General transfer comment: Min guard for safety. Stood from [employer], transferred to chair post ambulation.  Ambulation/Gait Ambulation/Gait assistance: Min guard Gait Distance (Feet): 10 Feet Assistive device: Rolling walker (2 wheeled) Gait Pattern/deviations: Step-to pattern;Shuffle;Trunk flexed;Step-through pattern Gait velocity: reduced   General Gait Details: Slow, short shuffling steps with 2-3/4 DOE. Bil knee instability noted but no buckling. Sp02 increased from 3-6L for mobility and stayed >85%. RR up to 45, pt highly anxious with short shallow breaths.   Stairs             Wheelchair Mobility    Modified Rankin (Stroke Patients Only)       Balance Overall balance assessment: Needs assistance Sitting-balance support: Feet supported;Bilateral upper extremity supported Sitting balance-Leahy Scale: Fair Sitting balance - Comments: Able to sit EOB with supervision for safety. Pt with coughing spells affecting Sp02 and RR. + productive cough.   Standing balance support: During functional activity Standing balance-Leahy Scale: Poor Standing balance comment: reliant on BUE support and light min guard for safety  Cognition Arousal/Alertness: Awake/alert Behavior During Therapy: Anxious Overall Cognitive Status: Within Functional Limits for tasks assessed                                  General Comments: Very pleasant and motivated      Exercises      General Comments General comments (skin integrity, edema, etc.): Sp02 stayed >85% on 6L/min 02 [place] with activity. Had to increase from 3-6L for mobility. Pt highly anxious with increased RR.      Pertinent Vitals/Pain Pain Assessment: Faces Pain Score: 4  Faces Pain Scale: Hurts little more Pain Location: chest during coughing spells Pain Descriptors / Indicators: Discomfort;Sore Pain Intervention(s): Repositioned;Monitored during session    Home Living                      Prior Function            PT Goals (current goals can now be found in the care plan section) Acute Rehab PT Goals Patient Stated Goal: to go home Progress towards PT goals: Progressing toward goals(slowly)    Frequency    Min 3X/week      PT Plan Current plan remains appropriate    Co-evaluation              AM-PAC PT "6 Clicks" Mobility   Outcome Measure  Help needed turning from your back to your side while in a flat bed without using bedrails?: A Little Help needed moving from lying on your back to sitting on the side of a flat bed without using bedrails?: A Little Help needed moving to and from a bed to a chair (including a wheelchair)?: A Little Help needed standing up from a chair using your arms (e.g., wheelchair or bedside chair)?: A Little Help needed to walk in hospital room?: A Little Help needed climbing 3-5 steps with a railing? : A Lot 6 Click Score: 17    End of Session Equipment Utilized During Treatment: Oxygen Activity Tolerance: Patient limited by fatigue Patient left: in chair;with call bell/phone within reach;with nursing/sitter in room Nurse Communication: Mobility status PT Visit Diagnosis: Unsteadiness on feet (R26.81);Muscle weakness (generalized) (M62.81)     Time: 1003-1030 PT Time Calculation (min) (ACUTE ONLY): 27 min  Charges:  $Therapeutic Exercise: 8-22  mins $Therapeutic Activity: 8-22 mins                     Vale Haven, PT, DPT Acute Rehabilitation Services Pager [phone] Office [phone]       Blake Divine A Lanier Ensign 12/15/2019, 12:30 PM

## 2019-12-15 NOTE — Progress Notes (Signed)
PULMONARY / CRITICAL CARE MEDICINE   NAME:  Nathaniel Webb, MRN:  [id number], DOB:  1966/08/11, LOS: 76 ADMISSION DATE:  11/11/2019, CONSULTATION DATE:  1/12 REFERRING MD:  Sherral Hammers, CHIEF COMPLAINT:  Pleural effusion   BRIEF HISTORY:    Nathaniel Webb is a 54 year old male admitted for COVID-19 pneumonia. Hospital course complicated by pulmonary embolism, pleural effusion, hydropneumothorax and pneumomediastinum s/p chest tube, mechanical ventilation, BPF and deconditioning.  SIGNIFICANT PAST MEDICAL HISTORY    Past Medical History:  Diagnosis Date  . Diabetes mellitus without complication ([place])   . Heel pain 06/09/2015  . High triglycerides 05/09/2016  . Hypertension   . Low HDL (under 40) 05/09/2016  . Obesity, Class II, BMI 35-39.9, with comorbidity ([place]) 01/04/2016   SIGNIFICANT EVENTS:  12/29: Admitted 1/12: PCCM consulted with performance of a thoracentesis 1/13 Tx to ICU with worsening pneumothorax 1/17 intubated 1/18 Continue to have R sided pneumothorax despite chest tube. 28 french placed and extubated that evening 1/19 small bore ct removed, getting lasix. Feeling better and stronger. airleak still brisk 1/20 O2 down to 4L, still desaturating. 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 Improved overal aeration. CVTS> Continue current Rx. ALI Resolved might be VATS Candidate eventually.  1/21 hematuria>Holding LMWH with reassessment 1/22 1/22: PT ordered, patient encouraged ambulation. Start trending CBC again to follow leukocytosis. Possible ct repositioning pending CBC results.  1/25: Low molecular weight heparin discontinued altogether given intraparenchymal hematoma identified on 1/24 via CT scan 1/28 Restarted anticoagulation with heparin high risk protocol  STUDIES:   CTA Chest 12/29 >> positive for PE with small volume embolus in the distal right pulmonary artery, lobar, and some segmental branches CT ABD/Pelvis  1/10 >> moderate stool burden in colon, R>L airspace disease compatible with PNA, loculated right pleural effusion CT Chest w/o 1/10 >> multiloculated right lung hydropneumothorax, larger volume of pleural fluid relative to pleural air, progression of multilobar right lung consolidation, small volume loculated right pneumomediastinum Thora1/12>> glucose <20, LD 2,447, protein 5.2, total nucleated cells 117,299, neutrophil 85 CT Chest w/o 1/13 >> limited exam by motion, large right hydropneumothorax with numerous thin internal septations with air fluid levels increased in size, complete right lung atelectasis, pneumomediastinum in the anterior right pericardial region, similar extensive patchy GGO on L 1/21 CT chest > Decreased small to moderate right hydropneumothorax. 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 Improved aeration of the right lung with residual mild to moderate atelectasis.Decreased pneumomediastinum in the anterior right pericardial region. 1/24 CT chest >Interval development of a 12 x 11 cm collection that appears to be centered within the right lower lobe parenchyma. This is suboptimally evaluated in the absence of IV contrast. Differential considerations include a lung abscess or hematoma or a combination of both  CULTURES:  1/10 Blood Culture: Staph Aureus. R Erythromycin, Clindamycin,  1/12 Body Pleural Fluid: Staph Aureus. R Erythromcyin, Clindamycin ANTIBIOTICS:  Remdesivir 12/27>1/1 Tocilizumab: 12/28> Cefepime: 1/10>>1/14 Linezolid 1/10>>1/14 Cefezolin 1/14>>  LINES/TUBES:  Right single lumen midline 1/24 Right 14 fr chest tube 1/13 > 1/19 Right 24fr chest tube 1/18  CONSULTANTS:  PCCM Cardiothoracic Surgery PMR  SUBJECTIVE:  No events, weaned to 2L O2.  Ongoing cough with white-brown phlegm.  CONSTITUTIONAL: BP (!) 109/91 (BP Location: Left Arm)   Pulse 92   Temp 97.7 F (36.5 C) (Oral)   Resp (!) 29   Ht 5\' 5"   (1.651 m)   Wt  91.7 kg   SpO2 100%   BMI 33.64 kg/m   I/O last 3 completed shifts: In: 320 [P.O.:320] Out: 1670 [Urine:1550; Chest Tube:120]        Physical Exam: GEN: middle aged man in NAD HEENT: MMM, trachea midline CV: RRR, ext warm PULM: terrible air movement on R, ongoing large air leak in atrium GI: Soft, +BS EXT: minimal  NEURO: moves all 4 ext to command PSYCH: AOx3, poor insight SKIN: no rashes  Anemia stable Cr looks good Heparin levels are fine Bps look good   RESOLVED PROBLEM LIST  Acute hypoxic respiratory failure 2/2 COVID PNA w/ ALI (diagnosed end of December, well out of infectious period) Hematuria  AKI ASSESSMENT AND PLAN   # Right lung issues: MSSA empyema, BP fistula, probable hematoma formation - Ongoing air leak - Minimal current O2 need - He's going to need a washout at some point, will touch base with TCTS again today, continue CT to suction in interim  # Small Acute Pulmonary Embolism but no DVT noted on [place] 1/25- 12/29 PE appreciated in the Right distal pulmonary artery  - He can stop AC for OR at any time, this PE was negligible   Hypertension:  P: Well controlled on scheduled Lopressor, continue   Diabetes Mellitis:  P: Continue current glucemic regiment with SSI and basal insulin; need to drop insulin if ging to be NPO  Constipation P: Stool regimen in place   Nasal congestion P: Nasal saline and nasal steroids ordered    Best Practice / Goals of Care / Disposition.   DVT PROPHYLAXIS:heparin SUP:NA NUTRITION:Dysphagia 3  MOBILITY: Bed rest, up with PT GOALS OF CARE:FULL FAMILY DISCUSSIONS: Updated patient on plan DISPOSITION pending figuring out what to do about this right lung  Myrla Halsted MD PCCM

## 2019-12-15 NOTE — Progress Notes (Signed)
Procedure(s) (LRB): VIDEO ASSISTED THORACOSCOPY (VATS)/EMPYEMA (Right) Sub  Overall improvement with increased strength [ walked 35feet] , improved oxygenation, and better nutrition. He has a residual loculated empyema , pneumothorax mand large airleak. He would benefit from a R VATS to treat the empyema and airleak. He understands that the antibiotics have improved the infection but to completely clear the infected pleural space he will need surgery.  He is currently on iv heparin for a small RLL subsegmental PE- this will need to be off for at least 36 hrs  Plan DC heparin 2-2 and surgery prob 2-4   Objective: Vital signs in last 24 hours: Temp:  [97.4 F (36.3 C)-98.5 F (36.9 C)] 97.7 F (36.5 C) (02/01 1101) Pulse Rate:  [76-99] 99 (02/01 1101) Cardiac Rhythm: Normal sinus rhythm (02/01 0701) Resp:  [20-30] 26 (02/01 1101) BP: (109-126)/(78-98) 115/98 (02/01 1101) SpO2:  [93 %-100 %] 93 % (02/01 1416)  Hemodynamic parameters for last 24 hours:    Intake/Output from previous day: 01/31 0701 - 02/01 0700 In: 320 [P.O.:320] Out: 1410 [Urine:1350; Chest Tube:60] Intake/Output this shift: Total I/O In: 840 [P.O.:840] Out: 400 [Urine:400]  UP in chair on 2.5 L O2 Large airleak from chest tube  Lab Results: Recent Labs    12/14/19 0319 12/15/19 0420  WBC 10.8* 10.4  HGB 7.2* 7.2*  HCT 23.4* 24.1*  PLT 363 390   BMET:  Recent Labs    12/14/19 0319 12/15/19 0420  NA 140 141  K 3.9 4.3  CL 102 102  CO2 29 27  GLUCOSE 123* 102*  BUN 13 11  CREATININE 0.91 0.92  CALCIUM 7.7* 8.0*    PT/INR: No results for input(s): LABPROT, INR in the last 72 hours. ABG    Component Value Date/Time   PHART 7.446 11/30/2019 0551   HCO3 30.8 (H) 11/30/2019 0551   TCO2 32 11/30/2019 0551   ACIDBASEDEF 4.1 (H) 11/09/2019 2210   O2SAT 98.0 11/30/2019 0551   CBG (last 3)  Recent Labs    12/14/19 2114 12/15/19 0642 12/15/19 1059  GLUCAP 114* 112* 127*     Assessment/Plan: S/P Procedure(s) (LRB): VIDEO ASSISTED THORACOSCOPY (VATS)/EMPYEMA (Right) PLan VATS later this week  ,2-4 DC heparin preop  PRBC preop to get Hb 8.5  LOS: 34 days    Nathaniel Webb 12/15/2019

## 2019-12-15 NOTE — Progress Notes (Signed)
ANTICOAGULATION CONSULT NOTE - Follow Up Consult  Pharmacy Consult for heparin  Indication: PE  No Known Allergies  Patient Measurements: Height: 5\' 5"  (165.1 cm) Weight: 202 lb 2.6 oz (91.7 kg) IBW/kg (Calculated) : 61.5 Heparin Dosing Weight: 85 kg  Vital Signs: Temp: 97.7 F (36.5 C) (02/01 1101) Temp Source: Oral (02/01 1101) BP: 115/98 (02/01 1101) Pulse Rate: 99 (02/01 1101)  Labs: Recent Labs    12/13/19 0308 12/13/19 0308 12/14/19 0319 12/15/19 0420 12/15/19 1453  HGB 7.2*   < > 7.2* 7.2*  --   HCT 23.7*  --  23.4* 24.1*  --   PLT 360  --  363 390  --   HEPARINUNFRC 0.30   < > 0.36 0.28* 0.37  CREATININE 1.02  --  0.91 0.92  --    < > = values in this interval not displayed.    Estimated Creatinine Clearance: 97.8 mL/min (by C-G formula based on SCr of 0.92 mg/dL).   Medications:  Scheduled:  . vitamin C  500 mg Oral Daily  . aspirin EC  81 mg Oral Daily  . atorvastatin  10 mg Oral QPM  . chlorhexidine  15 mL Mouth/Throat BID  . Chlorhexidine Gluconate Cloth  6 each Topical Daily  . chlorpheniramine-HYDROcodone  5 mL Oral Q12H  . docusate sodium  200 mg Oral BID  . feeding supplement (ENSURE ENLIVE)  237 mL Oral TID BM  . fluticasone  2 spray Each Nare BID  . insulin aspart  0-20 Units Subcutaneous TID WC  . insulin detemir  18 Units Subcutaneous Daily  . ipratropium-albuterol  3 mL Nebulization TID  . mouth rinse  15 mL Mouth Rinse BID  . Melatonin  3 mg Oral QHS  . metoprolol tartrate  50 mg Oral BID  . polyethylene glycol  17 g Oral Daily  . psyllium  1 packet Oral Daily  . senna-docusate  3 tablet Oral BID  . sodium chloride  2 spray Each Nare QID  . sodium chloride flush  10-40 mL Intracatheter Q12H  . sodium chloride flush  3 mL Intravenous Q12H  . zinc sulfate  220 mg Oral Daily   Infusions:  .  ceFAZolin (ANCEF) IV 2 g (12/15/19 1451)  . heparin 1,550 Units/hr (12/15/19 02/12/20)    Assessment: 54 yo male with COVID pneumonitis started  on Eliquis for PE, held for thoracentesis 1/12 and chest tube placement on 1/13.  Situation complicated by RLL hematoma, was taken off full dose lovenox 1/25 and placed on prophylaxis now back on IV heparin.  Heparin level therapeutic on drip rate 1550 units/hr. Hgb low but stable, plts wnl. No overt bleeding or infusion issues noted. Will continue current rate.  Goal of Therapy:  Heparin level 0.3-0.5 units/ml Monitor platelets by anticoagulation protocol: Yes   Plan:  Continue heparin 1550 units/hr. Daily heparin level and CBC Monitor bleeding closely  2/25, Pharm.D., BCPS Clinical Pharmacist 12/15/2019 4:04 PM

## 2019-12-15 NOTE — Progress Notes (Signed)
ANTICOAGULATION CONSULT NOTE - Follow Up Consult  Pharmacy Consult for heparin  Indication: PE  No Known Allergies  Patient Measurements: Height: 5\' 5"  (165.1 cm) Weight: 202 lb 2.6 oz (91.7 kg) IBW/kg (Calculated) : 61.5 Heparin Dosing Weight: 85 kg  Vital Signs: Temp: 97.7 F (36.5 C) (02/01 0756) Temp Source: Oral (02/01 0756) BP: 109/91 (02/01 0756) Pulse Rate: 92 (02/01 0756)  Labs: Recent Labs    12/13/19 0308 12/13/19 0308 12/14/19 0319 12/15/19 0420  HGB 7.2*   < > 7.2* 7.2*  HCT 23.7*  --  23.4* 24.1*  PLT 360  --  363 390  HEPARINUNFRC 0.30  --  0.36 0.28*  CREATININE 1.02  --  0.91 0.92   < > = values in this interval not displayed.    Estimated Creatinine Clearance: 97.8 mL/min (by C-G formula based on SCr of 0.92 mg/dL).   Medications:  Scheduled:  . vitamin C  500 mg Oral Daily  . aspirin EC  81 mg Oral Daily  . atorvastatin  10 mg Oral QPM  . chlorhexidine  15 mL Mouth/Throat BID  . Chlorhexidine Gluconate Cloth  6 each Topical Daily  . chlorpheniramine-HYDROcodone  5 mL Oral Q12H  . docusate sodium  200 mg Oral BID  . feeding supplement (ENSURE ENLIVE)  237 mL Oral TID BM  . fluticasone  2 spray Each Nare BID  . insulin aspart  0-20 Units Subcutaneous TID WC  . insulin detemir  18 Units Subcutaneous Daily  . ipratropium-albuterol  3 mL Nebulization TID  . mouth rinse  15 mL Mouth Rinse BID  . Melatonin  3 mg Oral QHS  . metoprolol tartrate  50 mg Oral BID  . polyethylene glycol  17 g Oral Daily  . psyllium  1 packet Oral Daily  . senna-docusate  3 tablet Oral BID  . sodium chloride  2 spray Each Nare QID  . sodium chloride flush  10-40 mL Intracatheter Q12H  . sodium chloride flush  3 mL Intravenous Q12H  . zinc sulfate  220 mg Oral Daily   Infusions:  .  ceFAZolin (ANCEF) IV 2 g (12/15/19 0556)  . heparin 1,500 Units/hr (12/13/19 12/15/19)    Assessment: 54 yo male with COVID pneumonitis started on Eliquis for PE, held for  thoracentesis 1/12 and chest tube placement on 1/13.  Situation complicated by RLL hematoma, was taken off full dose lovenox 1/25 and placed on prophylaxis now back on IV heparin.  Heparin level (0.28) therapeutic on drip rate 1500 units/hr. Hgb low but stable, plts wnl. No overt bleeding or infusion issues noted. Will continue current rate.   Goal of Therapy:  Heparin level 0.3-0.5 units/ml Monitor platelets by anticoagulation protocol: Yes   Plan:  -Inc heparin 1550 units/hr. -Recheck level in 6 hrs. -Daily heparin level and CBC -Monitor bleeding closely  2/25, [facility] Clinical Pharmacist Phone [phone]  12/15/2019 8:53 AM

## 2019-12-16 ENCOUNTER — Ambulatory Visit: Payer: No Typology Code available for payment source | Admitting: Podiatry

## 2019-12-16 LAB — CBC
HCT: 25.2 % — ABNORMAL LOW (ref 39.0–52.0)
Hemoglobin: 7.5 g/dL — ABNORMAL LOW (ref 13.0–17.0)
MCH: 26.4 pg (ref 26.0–34.0)
MCHC: 29.8 g/dL — ABNORMAL LOW (ref 30.0–36.0)
MCV: 88.7 fL (ref 80.0–100.0)
Platelets: 411 K/uL — ABNORMAL HIGH (ref 150–400)
RBC: 2.84 MIL/uL — ABNORMAL LOW (ref 4.22–5.81)
RDW: 17.2 % — ABNORMAL HIGH (ref 11.5–15.5)
WBC: 10.7 K/uL — ABNORMAL HIGH (ref 4.0–10.5)
nRBC: 0 % (ref 0.0–0.2)

## 2019-12-16 LAB — GLUCOSE, CAPILLARY
Glucose-Capillary: 114 mg/dL — ABNORMAL HIGH (ref 70–99)
Glucose-Capillary: 106 mg/dL — ABNORMAL HIGH (ref 70–99)
Glucose-Capillary: 145 mg/dL — ABNORMAL HIGH (ref 70–99)
Glucose-Capillary: 79 mg/dL (ref 70–99)

## 2019-12-16 LAB — COMPREHENSIVE METABOLIC PANEL
ALT: 37 U/L (ref 0–44)
AST: 58 U/L — ABNORMAL HIGH (ref 15–41)
Albumin: 1.7 g/dL — ABNORMAL LOW (ref 3.5–5.0)
Alkaline Phosphatase: 122 U/L (ref 38–126)
Anion gap: 8 (ref 5–15)
BUN: 11 mg/dL (ref 6–20)
CO2: 29 mmol/L (ref 22–32)
Calcium: 8.2 mg/dL — ABNORMAL LOW (ref 8.9–10.3)
Chloride: 103 mmol/L (ref 98–111)
Creatinine, Ser: 0.99 mg/dL (ref 0.61–1.24)
GFR calc Af Amer: >60 mL/min (ref >60)
GFR calc non Af Amer: >60 mL/min (ref >60)
Glucose, Bld: 112 mg/dL — ABNORMAL HIGH (ref 70–99)
Potassium: 4.2 mmol/L (ref 3.5–5.1)
Sodium: 140 mmol/L (ref 135–145)
Total Bilirubin: 0.3 mg/dL (ref 0.3–1.2)
Total Protein: 6.2 g/dL — ABNORMAL LOW (ref 6.5–8.1)

## 2019-12-16 LAB — PREPARE RBC (CROSSMATCH)

## 2019-12-16 LAB — HEPARIN LEVEL (UNFRACTIONATED): Heparin Unfractionated: 0.26 [IU]/mL — ABNORMAL LOW (ref 0.30–0.70)

## 2019-12-16 NOTE — Progress Notes (Signed)
Nutrition Follow up  DOCUMENTATION CODES:   Obesity unspecified  INTERVENTION:    Continue Ensure Enlive po TID, each supplement provides 350 kcal and 20 grams of protein  Continue Magic cup TID with meals, each supplement provides 290 kcal and 9 grams of protein  MVI daily   NUTRITION DIAGNOSIS:   Inadequate oral intake related to acute illness, catabolic illness, decreased appetite as evidenced by NPO status, meal completion < 50%.  Ongoing  GOAL:   Patient will meet greater than or equal to 90% of their needs   Not meeting   MONITOR:   PO intake, Supplement acceptance, Labs, Weight trends, Diet advancement  REASON FOR ASSESSMENT:   Consult, Ventilator Enteral/tube feeding initiation and management  ASSESSMENT:   54 yo male admitted with COVID 19 pneumonia, developed worsening pneumothorax requiring chest tube, brief intubation.   PMH includes DM, HTN, morbid obesity  12/29 Admit 01/13 Worsening pneumothorax, chest tube placed 01/17 Intubated 01/18 Extubated  Pt reports appetite has increased almost back to baseline. RD observed lunch tray 75% completed. Meal completions charted as 50-100% for his last eight meals (61% average). He drinks three Ensures daily and is willing to continue. Reiterated the importance of continued protein and kcal intake.   Admission weight: 95.5 kg  Current weight: 91.7 kg   I/O: -9,685 ml since 1/19 UOP: 1,500 ml x 24 hrs  Chest tubes: 170 ml x 24 hrs   Medications: 500 mg Vit C daily, colace, SS novolog, lememir, miralax, senokot, metamucil, 220 mg zinc sulfate  Labs: CBG 106-127  Diet Order:   Diet Order            DIET DYS 3 Room service appropriate? Yes; Fluid consistency: Thin  Diet effective now              EDUCATION NEEDS:   Not appropriate for education at this time  Skin:  Skin Assessment: Reviewed RN Assessment  Last BM:  2/2  Height:   Ht Readings from Last 1 Encounters:  11/11/19 5\' 5"  (1.651 m)     Weight:   Wt Readings from Last 1 Encounters:  12/05/19 91.7 kg    Ideal Body Weight:  61.8 kg  BMI:  Body mass index is 33.64 kg/m.  Estimated Nutritional Needs:   Kcal:  2325-2780 kcals  Protein:  135-160 g  Fluid:  >/= 2.2 L  03-29-2006 RD, LDN Clinical Nutrition Pager # [phone]

## 2019-12-16 NOTE — Progress Notes (Signed)
Procedure(s) (LRB): VIDEO ASSISTED THORACOSCOPY (VATS)/EMPYEMA (Right) Subjective: Was able to stand with PT today but too weak to walk On 4L O2 Coughing with air leak Plan R VATS thurs for empyema, persistent air leak 1 U PRBC for anemia chronic disease- Hb 7.5 Pt understands he will need ventilator support postop  Objective: Vital signs in last 24 hours: Temp:  [97.7 F (36.5 C)-99.2 F (37.3 C)] 97.7 F (36.5 C) (02/02 1125) Pulse Rate:  [87-107] 107 (02/02 1125) Cardiac Rhythm: Normal sinus rhythm (02/02 0700) Resp:  [20-36] 25 (02/02 1125) BP: (115-138)/(73-92) 128/91 (02/02 1125) SpO2:  [93 %-100 %] 98 % (02/02 1125) FiO2 (%):  [40 %] 40 % (02/01 2311)  Hemodynamic parameters for last 24 hours:    Intake/Output from previous day: 02/01 0701 - 02/02 0700 In: 1222 [P.O.:840; I.V.:182; IV Piggyback:200] Out: 1670 [Urine:1500; Chest Tube:170] Intake/Output this shift: Total I/O In: 120 [P.O.:120] Out: 400 [Urine:400]  Coarse breath sounds Air leak with cough Lab Results: Recent Labs    12/15/19 0420 12/16/19 0438  WBC 10.4 10.7*  HGB 7.2* 7.5*  HCT 24.1* 25.2*  PLT 390 411*   BMET:  Recent Labs    12/15/19 0420 12/16/19 0438  NA 141 140  K 4.3 4.2  CL 102 103  CO2 27 29  GLUCOSE 102* 112*  BUN 11 11  CREATININE 0.92 0.99  CALCIUM 8.0* 8.2*    PT/INR: No results for input(s): LABPROT, INR in the last 72 hours. ABG    Component Value Date/Time   PHART 7.446 11/30/2019 0551   HCO3 30.8 (H) 11/30/2019 0551   TCO2 32 11/30/2019 0551   ACIDBASEDEF 4.1 (H) 11/09/2019 2210   O2SAT 98.0 11/30/2019 0551   CBG (last 3)  Recent Labs    12/15/19 2108 12/16/19 0615 12/16/19 1109  GLUCAP 122* 114* 106*    Assessment/Plan: S/P Procedure(s) (LRB): VIDEO ASSISTED THORACOSCOPY (VATS)/EMPYEMA (Right) R VATS thurs   LOS: 35 days    Nathaniel Webb 12/16/2019

## 2019-12-16 NOTE — Progress Notes (Signed)
PULMONARY / CRITICAL CARE MEDICINE   NAME:  Nathaniel Webb, MRN:  [id number], DOB:  1966-01-27, LOS: 35 ADMISSION DATE:  11/11/2019, CONSULTATION DATE:  1/12 REFERRING MD:  Joseph Art, CHIEF COMPLAINT:  Pleural effusion   BRIEF HISTORY:    Nathaniel Webb is a 54 year old male admitted for COVID-19 pneumonia. Hospital course complicated by pulmonary embolism, pleural effusion, hydropneumothorax and pneumomediastinum s/p chest tube, mechanical ventilation, BPF and deconditioning.  SIGNIFICANT PAST MEDICAL HISTORY    Past Medical History:  Diagnosis Date  . Diabetes mellitus without complication (HCC)   . Heel pain 06/09/2015  . High triglycerides 05/09/2016  . Hypertension   . Low HDL (under 40) 05/09/2016  . Obesity, Class II, BMI 35-39.9, with comorbidity (HCC) 01/04/2016   SIGNIFICANT EVENTS:  12/29: Admitted 1/12: PCCM consulted with performance of a thoracentesis 1/13 Tx to ICU with worsening pneumothorax 1/17 intubated 1/18 Continue to have R sided pneumothorax despite chest tube. 28 french placed and extubated that evening 1/19 small bore ct removed, getting lasix. Feeling better and stronger. airleak still brisk 1/20 O2 down to 4L, still desaturating. 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 Improved overal aeration. CVTS> Continue current Rx. ALI Resolved might be VATS Candidate eventually.  1/21 hematuria>Holding LMWH with reassessment 1/22 1/22: PT ordered, patient encouraged ambulation. Start trending CBC again to follow leukocytosis. Possible ct repositioning pending CBC results.  1/25: Low molecular weight heparin discontinued altogether given intraparenchymal hematoma identified on 1/24 via CT scan 1/28 Restarted anticoagulation with heparin high risk protocol  STUDIES:   CTA Chest 12/29 >> positive for PE with small volume embolus in the distal right pulmonary artery, lobar, and some segmental branches CT ABD/Pelvis  1/10 >> moderate stool burden in colon, R>L airspace disease compatible with PNA, loculated right pleural effusion CT Chest w/o 1/10 >> multiloculated right lung hydropneumothorax, larger volume of pleural fluid relative to pleural air, progression of multilobar right lung consolidation, small volume loculated right pneumomediastinum Thora1/12>> glucose <20, LD 2,447, protein 5.2, total nucleated cells 117,299, neutrophil 85 CT Chest w/o 1/13 >> limited exam by motion, large right hydropneumothorax with numerous thin internal septations with air fluid levels increased in size, complete right lung atelectasis, pneumomediastinum in the anterior right pericardial region, similar extensive patchy GGO on L 1/21 CT chest > Decreased small to moderate right hydropneumothorax. 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 Improved aeration of the right lung with residual mild to moderate atelectasis.Decreased pneumomediastinum in the anterior right pericardial region. 1/24 CT chest >Interval development of a 12 x 11 cm collection that appears to be centered within the right lower lobe parenchyma. This is suboptimally evaluated in the absence of IV contrast. Differential considerations include a lung abscess or hematoma or a combination of both  CULTURES:  1/10 Blood Culture: Staph Aureus. R Erythromycin, Clindamycin,  1/12 Body Pleural Fluid: Staph Aureus. R Erythromcyin, Clindamycin ANTIBIOTICS:  Remdesivir 12/27>1/1 Tocilizumab: 12/28> Cefepime: 1/10>>1/14 Linezolid 1/10>>1/14 Cefezolin 1/14>>  LINES/TUBES:  Right single lumen midline 1/24 Right 14 fr chest tube 1/13 > 1/19 Right 43fr chest tube 1/18  CONSULTANTS:  PCCM Cardiothoracic Surgery PMR  SUBJECTIVE:  No events, weaned to 2L O2.  Ongoing cough with white-brown phlegm.  CONSTITUTIONAL: BP 115/80 (BP Location: Left Arm)   Pulse 87   Temp 99.2 F (37.3 C) (Oral)   Resp 20   Ht 5\' 5"  (1.651 m)    Wt 91.7 kg  SpO2 100%   BMI 33.64 kg/m   I/O last 3 completed shifts: In: 1222 [P.O.:840; I.V.:182; IV Piggyback:200] Out: 2280 [Urine:2100; Chest Tube:180]     FiO2 (%):  [40 %] 40 %  Physical Exam: GEN: middle aged man in NAD HEENT: MMM, trachea midline CV: RRR, ext warm PULM: rhonci on R, ongoing large air leak in atrium GI: Soft, +BS EXT: minimal edema NEURO: moves all 4 ext to command PSYCH: AOx3, poor insight SKIN: no rashes  Anemia stable Cr looks good  RESOLVED PROBLEM LIST  Acute hypoxic respiratory failure 2/2 COVID PNA w/ ALI (diagnosed end of December, well out of infectious period) Hematuria  AKI ASSESSMENT AND PLAN   # Right lung issues: MSSA empyema, BP fistula, probable hematoma formation - Ongoing air leak - Minimal current O2 need - Stop heparin drip today, plan for OR for VATS with Dr. VT in 1-2 days.   # Small Acute Pulmonary Embolism but no DVT noted on [place] 1/25- 12/29 PE appreciated in the Right distal pulmonary artery  - See above   Hypertension:  P: Well controlled on scheduled Lopressor, continue   Diabetes Mellitis:  P: Continue current glucemic regiment with SSI and basal insulin; need to drop insulin if ging to be NPO  Constipation P: Stool regimen in place   Nasal congestion P: Nasal saline and nasal steroids ordered    Erskine Emery MD PCCM

## 2019-12-16 NOTE — Progress Notes (Signed)
Inpatient Rehabilitation-Admissions Coordinator   [facility] continues to follow for possible IP Rehab admit. Noted pt may have VATS done in 1-2 days. Will follow up to assess pt's candidacy for CIR post procedure.   Cheri Rous, OTR/L  Rehab Admissions Coordinator  [phone] 12/16/2019 9:28 AM

## 2019-12-16 NOTE — Progress Notes (Signed)
Physical Therapy Treatment Patient Details Name: Nathaniel Webb MRN: [id number] DOB: 23-Sep-1966 Today's Date: 12/16/2019    History of Present Illness 54 y/o male w/ hx of Obesity, low HDL, HTN, DM, presented to [facility] (12/29) with approximately 1 week history of gradually worsening shortness of breath.  He was found to have acute hypoxic respiratory failure and was requiring high flow oxygen to maintain O2 saturations.  Chest x-ray was typical of Covid pneumonitis. Pt dx w/ COVID 12/23 and dc home w/ spouse. 1/10 CXR showed ARDS bilateral increasing opacification; respiratory status worsened; concerns for aspiration. Acute PE; loculated right pleural effusion; 1/11 CTS consult, recs chest tube. 1/12 CT chest with densely consolidated right lung, air fluid level/ hydropneumothorax. Thoracentesis 1/12; pleural fluid sampled and concerning for empyema.  1/13 emergently transferred to ICU for worsening hypoxia; CT showed worsening right hydropneumothorax with loculations in pleura. Emergent right chest tube placed. Pt intubated 11/30/19-12/01/19. Pt developed hematuria on 1/21.    PT Comments    Pt coughing with elevated RR upon PT arrival to room. Pt reports fatigue from coughing today, focus of PT session on repeated transfers, LE exercises, and breathing technique. Pt tolerated repeated sit to stands with substantial seated rests between each due to increased work of breathing and tachypnea up to 50 breaths/min. PT encouraged frequent rest breaks and breathing technique to recover. Per pt, VATS procedure planned for Thursday. PT to continue to follow acutely, will progress mobility as tolerated by pt.    Follow Up Recommendations  CIR     Equipment Recommendations  Rolling walker with 5" wheels;3in1 (PT)    Recommendations for Other Services       Precautions / Restrictions Precautions Precautions: Fall Precaution Comments: Rt sided Chest tube, monitor SpO2 and RR Restrictions Weight Bearing  Restrictions: No    Mobility  Bed Mobility Overal bed mobility: Needs Assistance             General bed mobility comments: pt up in chair upon PT arrival to room, staying in chair post-PT.  Transfers Overall transfer level: Needs assistance Equipment used: Rolling walker (2 wheeled) Transfers: Sit to/from Stand Sit to Stand: Min guard;Min assist         General transfer comment: Min guard for safety, occasional min assist for steadying. Increased time to rise, sit to stand x5 from recliner.  Ambulation/Gait         Gait velocity: reduced   General Gait Details: unable to attempt, RR 45-50 breaths/min with transfers   Stairs             Wheelchair Mobility    Modified Rankin (Stroke Patients Only)       Balance Overall balance assessment: Needs assistance Sitting-balance support: Feet supported;Bilateral upper extremity supported Sitting balance-Leahy Scale: Fair Sitting balance - Comments: Able to sit EOB with supervision for safety. Pt with coughing spells affecting Sp02 and RR. + productive cough.   Standing balance support: During functional activity Standing balance-Leahy Scale: Poor Standing balance comment: reliant on BUE support and light min guard for safety                            Cognition Arousal/Alertness: Awake/alert Behavior During Therapy: WFL for tasks assessed/performed Overall Cognitive Status: Within Functional Limits for tasks assessed  General Comments: Pt motivated to progress mobility, pt appears somewhat down emotionally today and states he has been coughing all morning which is painful and taxing.      Exercises General Exercises - Lower Extremity Hip Flexion/Marching: AROM;Both;10 reps;Standing Toe Raises: AROM;Both;10 reps;Seated Mini-Sqauts: AROM;5 reps;Seated;Standing(sit to stands from recliner)    General Comments General comments (skin integrity,  edema, etc.): RR 20-50 breaths/min, encouraged frequent rest breaks and breathing technique to recover. SpO2 89-100% on 3LO2 via [place]. HRmax 114 bpm      Pertinent Vitals/Pain Pain Assessment: Faces Faces Pain Scale: Hurts little more Pain Location: chest during coughing spells Pain Descriptors / Indicators: Discomfort;Sore Pain Intervention(s): Limited activity within patient's tolerance;Monitored during session;Premedicated before session;Repositioned    Home Living                      Prior Function            PT Goals (current goals can now be found in the care plan section) Acute Rehab PT Goals Patient Stated Goal: to go home PT Goal Formulation: With patient Time For Goal Achievement: 12/23/19 Potential to Achieve Goals: Fair Progress towards PT goals: Progressing toward goals    Frequency    Min 3X/week      PT Plan Current plan remains appropriate    Co-evaluation              AM-PAC PT "6 Clicks" Mobility   Outcome Measure  Help needed turning from your back to your side while in a flat bed without using bedrails?: A Little Help needed moving from lying on your back to sitting on the side of a flat bed without using bedrails?: A Little Help needed moving to and from a bed to a chair (including a wheelchair)?: A Little Help needed standing up from a chair using your arms (e.g., wheelchair or bedside chair)?: A Little Help needed to walk in hospital room?: A Lot Help needed climbing 3-5 steps with a railing? : A Lot 6 Click Score: 16    End of Session Equipment Utilized During Treatment: Oxygen Activity Tolerance: Patient limited by fatigue;Treatment limited secondary to medical complications (Comment)(work of breathing, frequent coughing) Patient left: in chair;with call bell/phone within reach;with nursing/sitter in room Nurse Communication: Mobility status PT Visit Diagnosis: Unsteadiness on feet (R26.81);Muscle weakness (generalized)  (M62.81)     Time: [id number] PT Time Calculation (min) (ACUTE ONLY): 33 min  Charges:  $Therapeutic Exercise: 8-22 mins $Therapeutic Activity: 8-22 mins                    Yasmin Bronaugh E, PT Acute Rehabilitation Services Pager [phone]  Office [phone]  Juanisha Bautch D Christel Bai 12/16/2019, 1:04 PM

## 2019-12-16 NOTE — Progress Notes (Signed)
Patient has been educated on signs and symptoms of adverse reactions during/post blood transfusion. He states that he understands all risks associated with transfusion, and wishes to proceed. He has also been provided documentation to read regarding blood transfusion. No issues/concerns noted at this time. No signs/symptoms of transfusion reaction.

## 2019-12-17 ENCOUNTER — Inpatient Hospital Stay (HOSPITAL_COMMUNITY): Payer: Medicaid Other

## 2019-12-17 DIAGNOSIS — J869 Pyothorax without fistula: Secondary | ICD-10-CM

## 2019-12-17 LAB — URINALYSIS, ROUTINE W REFLEX MICROSCOPIC
Bacteria, UA: NONE SEEN
Bilirubin Urine: NEGATIVE
Glucose, UA: NEGATIVE mg/dL
Ketones, ur: NEGATIVE mg/dL
Leukocytes,Ua: NEGATIVE
Nitrite: NEGATIVE
Protein, ur: NEGATIVE mg/dL
RBC / HPF: >50 RBC/hpf — ABNORMAL HIGH (ref 0–5)
Specific Gravity, Urine: 1.008 (ref 1.005–1.030)
pH: 9 — ABNORMAL HIGH (ref 5.0–8.0)

## 2019-12-17 LAB — GLUCOSE, CAPILLARY
Glucose-Capillary: 120 mg/dL — ABNORMAL HIGH (ref 70–99)
Glucose-Capillary: 104 mg/dL — ABNORMAL HIGH (ref 70–99)
Glucose-Capillary: 128 mg/dL — ABNORMAL HIGH (ref 70–99)
Glucose-Capillary: 145 mg/dL — ABNORMAL HIGH (ref 70–99)

## 2019-12-17 LAB — BLOOD GAS, ARTERIAL
Acid-Base Excess: 6.6 mmol/L — ABNORMAL HIGH (ref 0.0–2.0)
Bicarbonate: 31 mmol/L — ABNORMAL HIGH (ref 20.0–28.0)
Drawn by: 350431
FIO2: 32
O2 Saturation: 97.4 %
Patient temperature: 37.1
pCO2 arterial: 48.7 mmHg — ABNORMAL HIGH (ref 32.0–48.0)
pH, Arterial: 7.421 (ref 7.350–7.450)
pO2, Arterial: 87.1 mmHg (ref 83.0–108.0)

## 2019-12-17 LAB — CBC
HCT: 27.8 % — ABNORMAL LOW (ref 39.0–52.0)
Hemoglobin: 8.8 g/dL — ABNORMAL LOW (ref 13.0–17.0)
MCH: 28 pg (ref 26.0–34.0)
MCHC: 31.7 g/dL (ref 30.0–36.0)
MCV: 88.5 fL (ref 80.0–100.0)
Platelets: 399 10*3/uL (ref 150–400)
RBC: 3.14 MIL/uL — ABNORMAL LOW (ref 4.22–5.81)
RDW: 16.7 % — ABNORMAL HIGH (ref 11.5–15.5)
WBC: 11.5 10*3/uL — ABNORMAL HIGH (ref 4.0–10.5)
nRBC: 0 % (ref 0.0–0.2)

## 2019-12-17 LAB — BASIC METABOLIC PANEL
Anion gap: 13 (ref 5–15)
BUN: 12 mg/dL (ref 6–20)
CO2: 28 mmol/L (ref 22–32)
Calcium: 8.5 mg/dL — ABNORMAL LOW (ref 8.9–10.3)
Chloride: 100 mmol/L (ref 98–111)
Creatinine, Ser: 0.82 mg/dL (ref 0.61–1.24)
GFR calc Af Amer: >60 mL/min (ref >60)
GFR calc non Af Amer: >60 mL/min (ref >60)
Glucose, Bld: 105 mg/dL — ABNORMAL HIGH (ref 70–99)
Potassium: 4.2 mmol/L (ref 3.5–5.1)
Sodium: 141 mmol/L (ref 135–145)

## 2019-12-17 LAB — PREPARE RBC (CROSSMATCH)

## 2019-12-17 MED ORDER — SORBITOL 70 % PO SOLN
60.0000 mL | Freq: Once | ORAL | Status: DC
  Filled 2019-12-17: qty 60

## 2019-12-17 MED ORDER — CEFAZOLIN SODIUM-DEXTROSE 2-4 GM/100ML-% IV SOLN: 2.0000 g | INTRAVENOUS | Status: DC

## 2019-12-17 MED ORDER — SORBITOL 70 % SOLN
60.0000 mL | Freq: Once | Status: DC
  Filled 2019-12-17: qty 60

## 2019-12-17 MED ORDER — IPRATROPIUM-ALBUTEROL 0.5-2.5 (3) MG/3ML IN SOLN
3.0000 mL | Freq: Two times a day (BID) | RESPIRATORY_TRACT | Status: DC
  Administered 2019-12-17: 3 mL via RESPIRATORY_TRACT
  Filled 2019-12-17: qty 3

## 2019-12-17 MED ORDER — KETOROLAC TROMETHAMINE 30 MG/ML IJ SOLN
30.0000 mg | Freq: Once | INTRAMUSCULAR | Status: AC
  Administered 2019-12-17: 30 mg via INTRAVENOUS
  Filled 2019-12-17: qty 1

## 2019-12-17 NOTE — Progress Notes (Signed)
PULMONARY / CRITICAL CARE MEDICINE   NAME:  Nathaniel Webb, MRN:  [id number], DOB:  1966-07-22, LOS: 36 ADMISSION DATE:  11/11/2019, CONSULTATION DATE:  1/12 REFERRING MD:  Joseph Art, CHIEF COMPLAINT:  Pleural effusion   BRIEF HISTORY:    [name] is a 54 year old male admitted for COVID-19 pneumonia. Hospital course complicated by pulmonary embolism, pleural effusion, hydropneumothorax and pneumomediastinum s/p chest tube, mechanical ventilation, BPF and deconditioning.  SIGNIFICANT PAST MEDICAL HISTORY    Past Medical History:  Diagnosis Date  . Diabetes mellitus without complication (HCC)   . Heel pain 06/09/2015  . High triglycerides 05/09/2016  . Hypertension   . Low HDL (under 40) 05/09/2016  . Obesity, Class II, BMI 35-39.9, with comorbidity (HCC) 01/04/2016   SIGNIFICANT EVENTS:  12/29: Admitted 1/12: PCCM consulted with performance of a thoracentesis 1/13 Tx to ICU with worsening pneumothorax 1/17 intubated 1/18 Continue to have R sided pneumothorax despite chest tube. 28 french placed and extubated that evening 1/19 small bore ct removed, getting lasix. Feeling better and stronger. airleak still brisk 1/20 O2 down to 4L, still desaturating. 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 Improved overal aeration. CVTS> Continue current Rx. ALI Resolved might be VATS Candidate eventually.  1/21 hematuria>Holding LMWH with reassessment 1/22 1/22: PT ordered, patient encouraged ambulation. Start trending CBC again to follow leukocytosis. Possible ct repositioning pending CBC results.  1/25: Low molecular weight heparin discontinued altogether given intraparenchymal hematoma identified on 1/24 via CT scan 1/28 Restarted anticoagulation with heparin high risk protocol 2/2: IV heparin discontinued and plans for VATS on 2/4  STUDIES:   CTA Chest 12/29 >> positive for PE with small volume embolus in the distal right pulmonary  artery, lobar, and some segmental branches CT ABD/Pelvis 1/10 >> moderate stool burden in colon, R>L airspace disease compatible with PNA, loculated right pleural effusion CT Chest w/o 1/10 >> multiloculated right lung hydropneumothorax, larger volume of pleural fluid relative to pleural air, progression of multilobar right lung consolidation, small volume loculated right pneumomediastinum Thora1/12>> glucose <20, LD 2,447, protein 5.2, total nucleated cells 117,299, neutrophil 85 CT Chest w/o 1/13 >> limited exam by motion, large right hydropneumothorax with numerous thin internal septations with air fluid levels increased in size, complete right lung atelectasis, pneumomediastinum in the anterior right pericardial region, similar extensive patchy GGO on L 1/21 CT chest > Decreased small to moderate right hydropneumothorax. 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 Improved aeration of the right lung with residual mild to moderate atelectasis.Decreased pneumomediastinum in the anterior right pericardial region. 1/24 CT chest >Interval development of a 12 x 11 cm collection that appears to be centered within the right lower lobe parenchyma. This is suboptimally evaluated in the absence of IV contrast. Differential considerations include a lung abscess or hematoma or a combination of both  CULTURES:  1/10 Blood Culture: Staph Aureus. R Erythromycin, Clindamycin,  1/12 Body Pleural Fluid: Staph Aureus. R Erythromcyin, Clindamycin ANTIBIOTICS:  Remdesivir 12/27>1/1 Tocilizumab: 12/28> Cefepime: 1/10>>1/14 Linezolid 1/10>>1/14 Cefezolin 1/14>>  LINES/TUBES:  Right single lumen midline 1/24 Right 14 fr chest tube 1/13 > 1/19 Right 19fr chest tube 1/18  CONSULTANTS:  PCCM Cardiothoracic Surgery PMR  SUBJECTIVE:  Having some more pain than usual, suspect this is positional  CONSTITUTIONAL: Blood Pressure 126/89 (BP Location: Left Arm)   Pulse 95    Temperature 97.9 F (36.6 C) (Oral)   Respiration 20   Height  5\' 5"  (1.651 m)   Weight 91.7 kg   Oxygen Saturation 96%   Body Mass Index 33.64 kg/m   I/O last 3 completed shifts: In: 1696 [P.O.:360; I.V.:182; Blood:315; IV Piggyback:500] Out: 7893 [Urine:3325; Chest Tube:210]        Physical Exam: General is a very pleasant 54 year old black male he is resting in bed, he is having more pain than usual following repositioning HEENT normocephalic atraumatic poor dentition noted. Pulmonary: Rhonchorous on the right, very turbulent 7 out of 7 airleak persists dressing intact.  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  I personally evaluated the chest tube insertion site, took down the dressing.  Hematoma site looks the same.  Chest tube is sutured in place and has not migrated, I suspect this is just positional changing Plan Heparin already stopped, see below Keep chest tube to suction Continue Ancef through February 10, then transition to oral Augmentin for 2 more weeks post VATS Plan for VATS with thoracic surgery on 2/4 Supplemental oxygen Pulse oximetry I gave him a one-time dose of Toradol to assist with discomfort  Small Acute Pulmonary Embolism but no DVT noted on [place] 1/25- 12/29 PE appreciated in the Right distal  pulmonary artery  Plan Anticoagulation stopped on 2/2 Can resume with plan to complete 3 to 6 months when cleared by thoracic surgery   Hypertension:  Plan Continue clonidine and Lopressor  Diabetes Mellitis:  Fairly good glycemic control Plan He will be n.p.o. after midnight, will discontinue Levemir for tomorrow pending surgery Continue sliding scale insulin  Constipation Plan Continues current rx   Nasal congestion Plan Continue current nasal hygiene regimen  Erick Colace ACNP-BC [place] Pager # [phone] OR # [phone] if no answer

## 2019-12-17 NOTE — Progress Notes (Signed)
OT Cancellation Note  Patient Details Name: Nathaniel Webb MRN: [id number] DOB: 1965/12/23   Cancelled Treatment:    Reason Eval/Treat Not Completed: Pain limiting ability to participate;Other (comment) Pt politely declined OT session this AM stating he was having too much pain in R side to participate in session. Pt states he sat up for 8 hours yesterday and wants to rest today to prepare for his surgery. Will follow up for OT session as time allows.   Audery Amel., COTA/L Acute Rehabilitation Services [phone]   Angelina Pih 12/17/2019, 11:45 AM

## 2019-12-17 NOTE — Progress Notes (Signed)
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  Objective: Vital signs in last 24 hours: Temp:  [97.9 F (36.6 C)-98.7 F (37.1 C)] 98.2 F (36.8 C) (02/03 1535) Pulse Rate:  [81-97] 85 (02/03 1535) Cardiac Rhythm: Normal sinus rhythm (02/03 0700) Resp:  [20-24] 20 (02/03 0918) BP: (110-138)/(70-89) 138/70 (02/03 1535) SpO2:  [96 %-100 %] 97 % (02/03 1535)  Hemodynamic parameters for last 24 hours:  Afebrile sinus rhythm  Intake/Output from previous day: 02/02 0701 - 02/03 0700 In: 975 [P.O.:360; Blood:315; IV Piggyback:300] Out: 2475 [Urine:2325; Chest Tube:150] Intake/Output this shift: Total I/O In: 480 [P.O.:480] Out: 1175 [Urine:1175]  Coarse breath sounds Regular heart rate Abdomen nontender  Lab Results: Recent Labs    12/16/19 0438 12/17/19 0617  WBC 10.7* 11.5*  HGB 7.5* 8.8*  HCT 25.2* 27.8*  PLT 411* 399   BMET:  Recent Labs    12/16/19 0438 12/17/19 0617  NA 140 141  K 4.2 4.2  CL 103 100  CO2 29 28  GLUCOSE 112* 105*  BUN 11 12  CREATININE 0.99 0.82  CALCIUM 8.2* 8.5*    PT/INR: No results for input(s): LABPROT, INR in the last 72 hours. ABG    Component Value Date/Time   PHART 7.421 12/17/2019 0445   HCO3 31.0 (H) 12/17/2019 0445   TCO2 32 11/30/2019 0551   ACIDBASEDEF 4.1 (H) 11/09/2019 2210   O2SAT 97.4 12/17/2019 0445   CBG (last 3)  Recent Labs    12/17/19 0607 12/17/19 1202 12/17/19 1642  GLUCAP 120* 104* 128*    Assessment/Plan: S/P Procedure(s) (LRB): VIDEO ASSISTED THORACOSCOPY (VATS)/EMPYEMA (Right) Details of right VATS again reviewed with patient including benefits and risks.  We will plan with surgery tomorrow.   LOS: 36 days    Kathlee Nations Trigt  III 12/17/2019

## 2019-12-18 ENCOUNTER — Inpatient Hospital Stay (HOSPITAL_COMMUNITY): Payer: Medicaid Other

## 2019-12-18 ENCOUNTER — Encounter (HOSPITAL_COMMUNITY): Payer: Self-pay | Admitting: Internal Medicine

## 2019-12-18 ENCOUNTER — Encounter (HOSPITAL_COMMUNITY)
Admission: AD | Disposition: A | Discharge status: Home / Self Care | Payer: Self-pay | Source: Other Acute Inpatient Hospital | Attending: Cardiothoracic Surgery

## 2019-12-18 DIAGNOSIS — I97418 Intraoperative hemorrhage and hematoma of a circulatory system organ or structure complicating other circulatory system procedure: Secondary | ICD-10-CM

## 2019-12-18 DIAGNOSIS — J869 Pyothorax without fistula: Secondary | ICD-10-CM | POA: Diagnosis present

## 2019-12-18 HISTORY — PX: VIDEO ASSISTED THORACOSCOPY (VATS)/EMPYEMA: SHX6172

## 2019-12-18 HISTORY — DX: Pyothorax without fistula: J86.9

## 2019-12-18 LAB — BASIC METABOLIC PANEL
Anion gap: 11 (ref 5–15)
BUN: 14 mg/dL (ref 6–20)
CO2: 28 mmol/L (ref 22–32)
Calcium: 8.2 mg/dL — ABNORMAL LOW (ref 8.9–10.3)
Chloride: 101 mmol/L (ref 98–111)
Creatinine, Ser: 0.92 mg/dL (ref 0.61–1.24)
GFR calc Af Amer: >60 mL/min (ref >60)
GFR calc non Af Amer: >60 mL/min (ref >60)
Glucose, Bld: 103 mg/dL — ABNORMAL HIGH (ref 70–99)
Potassium: 4.3 mmol/L (ref 3.5–5.1)
Sodium: 140 mmol/L (ref 135–145)

## 2019-12-18 LAB — POCT I-STAT 7, (LYTES, BLD GAS, ICA,H+H)
Acid-Base Excess: 5 mmol/L — ABNORMAL HIGH (ref 0.0–2.0)
Acid-Base Excess: 6 mmol/L — ABNORMAL HIGH (ref 0.0–2.0)
Bicarbonate: 31.3 mmol/L — ABNORMAL HIGH (ref 20.0–28.0)
Bicarbonate: 29.8 mmol/L — ABNORMAL HIGH (ref 20.0–28.0)
Calcium, Ion: 1.16 mmol/L (ref 1.15–1.40)
Calcium, Ion: 1.11 mmol/L — ABNORMAL LOW (ref 1.15–1.40)
HCT: 31 % — ABNORMAL LOW (ref 39.0–52.0)
HCT: 34 % — ABNORMAL LOW (ref 39.0–52.0)
Hemoglobin: 10.5 g/dL — ABNORMAL LOW (ref 13.0–17.0)
Hemoglobin: 11.6 g/dL — ABNORMAL LOW (ref 13.0–17.0)
O2 Saturation: 97 %
O2 Saturation: 99 %
Patient temperature: 36.1
Patient temperature: 99.2
Potassium: 4.6 mmol/L (ref 3.5–5.1)
Potassium: 4 mmol/L (ref 3.5–5.1)
Sodium: 139 mmol/L (ref 135–145)
Sodium: 139 mmol/L (ref 135–145)
TCO2: 33 mmol/L — ABNORMAL HIGH (ref 22–32)
TCO2: 31 mmol/L (ref 22–32)
pCO2 arterial: 49.8 mmHg — ABNORMAL HIGH (ref 32.0–48.0)
pCO2 arterial: 38.8 mmHg (ref 32.0–48.0)
pH, Arterial: 7.403 (ref 7.350–7.450)
pH, Arterial: 7.495 — ABNORMAL HIGH (ref 7.350–7.450)
pO2, Arterial: 91 mmHg (ref 83.0–108.0)
pO2, Arterial: 132 mmHg — ABNORMAL HIGH (ref 83.0–108.0)

## 2019-12-18 LAB — CBC
HCT: 29 % — ABNORMAL LOW (ref 39.0–52.0)
HCT: 33.5 % — ABNORMAL LOW (ref 39.0–52.0)
Hemoglobin: 9 g/dL — ABNORMAL LOW (ref 13.0–17.0)
Hemoglobin: 10.9 g/dL — ABNORMAL LOW (ref 13.0–17.0)
MCH: 27.4 pg (ref 26.0–34.0)
MCH: 28.4 pg (ref 26.0–34.0)
MCHC: 31 g/dL (ref 30.0–36.0)
MCHC: 32.5 g/dL (ref 30.0–36.0)
MCV: 88.4 fL (ref 80.0–100.0)
MCV: 87.2 fL (ref 80.0–100.0)
Platelets: 386 10*3/uL (ref 150–400)
Platelets: 421 10*3/uL — ABNORMAL HIGH (ref 150–400)
RBC: 3.28 MIL/uL — ABNORMAL LOW (ref 4.22–5.81)
RBC: 3.84 MIL/uL — ABNORMAL LOW (ref 4.22–5.81)
RDW: 16.6 % — ABNORMAL HIGH (ref 11.5–15.5)
RDW: 16.1 % — ABNORMAL HIGH (ref 11.5–15.5)
WBC: 11.5 10*3/uL — ABNORMAL HIGH (ref 4.0–10.5)
WBC: 13.5 10*3/uL — ABNORMAL HIGH (ref 4.0–10.5)
nRBC: 0 % (ref 0.0–0.2)
nRBC: 0 % (ref 0.0–0.2)

## 2019-12-18 LAB — GLUCOSE, CAPILLARY
Glucose-Capillary: 90 mg/dL (ref 70–99)
Glucose-Capillary: 101 mg/dL — ABNORMAL HIGH (ref 70–99)
Glucose-Capillary: 85 mg/dL (ref 70–99)
Glucose-Capillary: 67 mg/dL — ABNORMAL LOW (ref 70–99)
Glucose-Capillary: 118 mg/dL — ABNORMAL HIGH (ref 70–99)
Glucose-Capillary: 82 mg/dL (ref 70–99)

## 2019-12-18 LAB — APTT: aPTT: 31 seconds (ref 24–36)

## 2019-12-18 LAB — PREPARE RBC (CROSSMATCH)

## 2019-12-18 SURGERY — VIDEO ASSISTED THORACOSCOPY (VATS)/EMPYEMA
Anesthesia: General | Site: Chest | Laterality: Right

## 2019-12-18 MED ORDER — PROPOFOL 10 MG/ML IV BOLUS
INTRAVENOUS | Status: DC | PRN
  Administered 2019-12-18: 100 mg via INTRAVENOUS

## 2019-12-18 MED ORDER — MEPERIDINE HCL 25 MG/ML IJ SOLN: 6.2500 mg | INTRAMUSCULAR | Status: DC | PRN

## 2019-12-18 MED ORDER — ONDANSETRON HCL 4 MG/2ML IJ SOLN: 4.0000 mg | Freq: Four times a day (QID) | INTRAMUSCULAR | Status: DC | PRN

## 2019-12-18 MED ORDER — FENTANYL 2500MCG IN NS 250ML (10MCG/ML) PREMIX INFUSION
50.0000 ug/h | INTRAVENOUS | Status: DC
  Administered 2019-12-18: 50 ug/h via INTRAVENOUS
  Filled 2019-12-18: qty 250

## 2019-12-18 MED ORDER — IPRATROPIUM-ALBUTEROL 0.5-2.5 (3) MG/3ML IN SOLN
3.0000 mL | RESPIRATORY_TRACT | Status: DC
  Administered 2019-12-18 – 2019-12-19 (×7): 3 mL via RESPIRATORY_TRACT
  Filled 2019-12-18 (×6): qty 3

## 2019-12-18 MED ORDER — HEMOSTATIC AGENTS (NO CHARGE) OPTIME
TOPICAL | Status: DC | PRN
  Administered 2019-12-18: 1 via TOPICAL

## 2019-12-18 MED ORDER — SENNOSIDES-DOCUSATE SODIUM 8.6-50 MG PO TABS
1.0000 | ORAL_TABLET | Freq: Every day | ORAL | Status: DC
  Administered 2019-12-19 – 2020-01-06 (×8): 1 via ORAL
  Filled 2019-12-18 (×14): qty 1

## 2019-12-18 MED ORDER — PROPOFOL 10 MG/ML IV BOLUS
INTRAVENOUS | Status: AC
  Filled 2019-12-18: qty 20

## 2019-12-18 MED ORDER — ACETAMINOPHEN 160 MG/5ML PO SOLN
1000.0000 mg | Freq: Four times a day (QID) | ORAL | Status: AC
  Administered 2019-12-18 – 2019-12-19 (×4): 1000 mg via ORAL
  Filled 2019-12-18 (×4): qty 40.6

## 2019-12-18 MED ORDER — ATORVASTATIN CALCIUM 10 MG PO TABS
10.0000 mg | ORAL_TABLET | Freq: Every evening | ORAL | Status: DC
  Administered 2019-12-19 – 2020-01-06 (×19): 10 mg via ORAL
  Filled 2019-12-18 (×19): qty 1

## 2019-12-18 MED ORDER — OXYCODONE HCL 5 MG PO TABS
5.0000 mg | ORAL_TABLET | ORAL | Status: DC | PRN
  Administered 2019-12-27 – 2020-01-06 (×23): 10 mg via ORAL
  Filled 2019-12-18 (×24): qty 2

## 2019-12-18 MED ORDER — CEFAZOLIN SODIUM-DEXTROSE 2-4 GM/100ML-% IV SOLN
2.0000 g | Freq: Three times a day (TID) | INTRAVENOUS | Status: DC
  Filled 2019-12-18: qty 100

## 2019-12-18 MED ORDER — FENTANYL CITRATE (PF) 100 MCG/2ML IJ SOLN
INTRAMUSCULAR | Status: DC | PRN
  Administered 2019-12-18: 200 ug via INTRAVENOUS
  Administered 2019-12-18: 50 ug via INTRAVENOUS

## 2019-12-18 MED ORDER — ROCURONIUM BROMIDE 10 MG/ML (PF) SYRINGE
PREFILLED_SYRINGE | INTRAVENOUS | Status: AC
  Filled 2019-12-18: qty 10

## 2019-12-18 MED ORDER — CHLORHEXIDINE GLUCONATE 0.12% ORAL RINSE (MEDLINE KIT)
15.0000 mL | Freq: Two times a day (BID) | OROMUCOSAL | Status: DC
  Administered 2019-12-18 – 2019-12-19 (×3): 15 mL via OROMUCOSAL

## 2019-12-18 MED ORDER — MIDAZOLAM HCL 5 MG/5ML IJ SOLN
INTRAMUSCULAR | Status: DC | PRN
  Administered 2019-12-18: 1.5 mg via INTRAVENOUS
  Administered 2019-12-18: .5 mg via INTRAVENOUS

## 2019-12-18 MED ORDER — TRAMADOL HCL 50 MG PO TABS
50.0000 mg | ORAL_TABLET | Freq: Four times a day (QID) | ORAL | Status: DC | PRN
  Administered 2019-12-19 – 2019-12-23 (×3): 100 mg via ORAL
  Administered 2019-12-23 – 2019-12-24 (×2): 50 mg via ORAL
  Administered 2019-12-26 (×2): 100 mg via ORAL
  Administered 2019-12-26: 50 mg via ORAL
  Administered 2019-12-29 – 2020-01-05 (×4): 100 mg via ORAL
  Administered 2020-01-06: 50 mg via ORAL
  Filled 2019-12-18 (×4): qty 2
  Filled 2019-12-18 (×2): qty 1
  Filled 2019-12-18 (×5): qty 2
  Filled 2019-12-18 (×2): qty 1

## 2019-12-18 MED ORDER — HYDROMORPHONE HCL 1 MG/ML IJ SOLN: 0.2500 mg | INTRAMUSCULAR | Status: DC | PRN

## 2019-12-18 MED ORDER — INSULIN ASPART 100 UNIT/ML ~~LOC~~ SOLN
0.0000 [IU] | SUBCUTANEOUS | Status: DC
  Administered 2019-12-19 (×3): 2 [IU] via SUBCUTANEOUS

## 2019-12-18 MED ORDER — MIDAZOLAM HCL 2 MG/2ML IJ SOLN
INTRAMUSCULAR | Status: AC
  Filled 2019-12-18: qty 2

## 2019-12-18 MED ORDER — FENTANYL BOLUS VIA INFUSION
50.0000 ug | INTRAVENOUS | Status: DC | PRN
  Administered 2019-12-18: 50 ug via INTRAVENOUS
  Filled 2019-12-18: qty 50

## 2019-12-18 MED ORDER — PHENYLEPHRINE HCL-NACL 10-0.9 MG/250ML-% IV SOLN
INTRAVENOUS | Status: DC | PRN
  Administered 2019-12-18: 25 ug/min via INTRAVENOUS

## 2019-12-18 MED ORDER — FAMOTIDINE IN NACL 20-0.9 MG/50ML-% IV SOLN
20.0000 mg | Freq: Two times a day (BID) | INTRAVENOUS | Status: DC
  Administered 2019-12-18 – 2019-12-21 (×7): 20 mg via INTRAVENOUS
  Filled 2019-12-18 (×7): qty 50

## 2019-12-18 MED ORDER — FENTANYL CITRATE (PF) 100 MCG/2ML IJ SOLN: 25.0000 ug | INTRAMUSCULAR | Status: DC | PRN

## 2019-12-18 MED ORDER — SODIUM CHLORIDE 0.9 % IV SOLN: INTRAVENOUS | Status: DC

## 2019-12-18 MED ORDER — LIDOCAINE 2% (20 MG/ML) 5 ML SYRINGE
INTRAMUSCULAR | Status: DC | PRN
  Administered 2019-12-18: 100 mg via INTRAVENOUS

## 2019-12-18 MED ORDER — ONDANSETRON HCL 4 MG/2ML IJ SOLN: 4.0000 mg | Freq: Once | INTRAMUSCULAR | Status: DC | PRN

## 2019-12-18 MED ORDER — LACTATED RINGERS IV SOLN: INTRAVENOUS | Status: DC | PRN

## 2019-12-18 MED ORDER — ACETAMINOPHEN 500 MG PO TABS
1000.0000 mg | ORAL_TABLET | Freq: Four times a day (QID) | ORAL | Status: AC
  Administered 2019-12-19 – 2019-12-23 (×15): 1000 mg via ORAL
  Filled 2019-12-18 (×16): qty 2

## 2019-12-18 MED ORDER — ONDANSETRON HCL 4 MG/2ML IJ SOLN
INTRAMUSCULAR | Status: DC | PRN
  Administered 2019-12-18: 4 mg via INTRAVENOUS

## 2019-12-18 MED ORDER — ORAL CARE MOUTH RINSE
15.0000 mL | OROMUCOSAL | Status: DC
  Administered 2019-12-18 – 2019-12-19 (×10): 15 mL via OROMUCOSAL

## 2019-12-18 MED ORDER — FENTANYL CITRATE (PF) 250 MCG/5ML IJ SOLN
INTRAMUSCULAR | Status: AC
  Filled 2019-12-18: qty 5

## 2019-12-18 MED ORDER — 0.9 % SODIUM CHLORIDE (POUR BTL) OPTIME
TOPICAL | Status: DC | PRN
  Administered 2019-12-18: 2000 mL

## 2019-12-18 MED ORDER — ROCURONIUM BROMIDE 10 MG/ML (PF) SYRINGE
PREFILLED_SYRINGE | INTRAVENOUS | Status: DC | PRN
  Administered 2019-12-18: 20 mg via INTRAVENOUS
  Administered 2019-12-18 (×2): 30 mg via INTRAVENOUS
  Administered 2019-12-18: 100 mg via INTRAVENOUS

## 2019-12-18 MED ORDER — CHLORHEXIDINE GLUCONATE CLOTH 2 % EX PADS
6.0000 | MEDICATED_PAD | Freq: Every day | CUTANEOUS | Status: DC
  Administered 2019-12-18 – 2020-01-06 (×18): 6 via TOPICAL

## 2019-12-18 MED ORDER — IPRATROPIUM-ALBUTEROL 0.5-2.5 (3) MG/3ML IN SOLN
RESPIRATORY_TRACT | Status: AC
  Filled 2019-12-18: qty 3

## 2019-12-18 MED ORDER — SODIUM CHLORIDE 0.9% FLUSH: 10.0000 mL | INTRAVENOUS | Status: DC | PRN

## 2019-12-18 MED ORDER — SODIUM CHLORIDE 0.9 % IV SOLN: INTRAVENOUS | Status: DC | PRN

## 2019-12-18 MED ORDER — PROPOFOL 1000 MG/100ML IV EMUL
INTRAVENOUS | Status: AC
  Filled 2019-12-18: qty 100

## 2019-12-18 MED ORDER — CEFAZOLIN SODIUM 1 G IJ SOLR
INTRAMUSCULAR | Status: AC
  Filled 2019-12-18: qty 20

## 2019-12-18 MED ORDER — PROPOFOL 500 MG/50ML IV EMUL
INTRAVENOUS | Status: DC | PRN
  Administered 2019-12-18: 100 ug/kg/min via INTRAVENOUS

## 2019-12-18 MED ORDER — BISACODYL 5 MG PO TBEC
10.0000 mg | DELAYED_RELEASE_TABLET | Freq: Every day | ORAL | Status: DC
  Administered 2019-12-18 – 2020-01-07 (×12): 10 mg via ORAL
  Filled 2019-12-18 (×19): qty 2

## 2019-12-18 MED ORDER — FENTANYL CITRATE (PF) 100 MCG/2ML IJ SOLN: 50.0000 ug | Freq: Once | INTRAMUSCULAR | Status: AC

## 2019-12-18 MED ORDER — PROPOFOL 1000 MG/100ML IV EMUL
0.0000 ug/kg/min | INTRAVENOUS | Status: DC
  Administered 2019-12-18: 40 ug/kg/min via INTRAVENOUS
  Administered 2019-12-18: 50 ug/kg/min via INTRAVENOUS
  Administered 2019-12-18 – 2019-12-19 (×2): 39.985 ug/kg/min via INTRAVENOUS
  Filled 2019-12-18 (×4): qty 100

## 2019-12-18 MED ORDER — SODIUM CHLORIDE 0.9% FLUSH
10.0000 mL | Freq: Two times a day (BID) | INTRAVENOUS | Status: DC
  Administered 2019-12-18: 13:00:00 10 mL
  Administered 2019-12-19: 40 mL
  Administered 2019-12-19 – 2019-12-23 (×6): 10 mL
  Administered 2019-12-23: 23:00:00 20 mL
  Administered 2019-12-24 – 2020-01-06 (×25): 10 mL

## 2019-12-18 MED ORDER — FENTANYL CITRATE (PF) 100 MCG/2ML IJ SOLN
INTRAMUSCULAR | Status: AC
  Administered 2019-12-18: 50 ug via INTRAVENOUS
  Filled 2019-12-18: qty 2

## 2019-12-18 SURGICAL SUPPLY — 81 items
BAG DECANTER FOR FLEXI CONT (MISCELLANEOUS) IMPLANT
BLADE CLIPPER SURG (BLADE) ×3 IMPLANT
BLADE SURG 11 STRL SS (BLADE) IMPLANT
CANISTER SUCT 3000ML PPV (MISCELLANEOUS) ×3 IMPLANT
CATH KIT ON-Q SILVERSOAK 5IN (CATHETERS) IMPLANT
CATH ROBINSON RED A/P 22FR (CATHETERS) IMPLANT
CATH THORACIC 28FR (CATHETERS) ×3 IMPLANT
CATH THORACIC 36FR (CATHETERS) IMPLANT
CATH THORACIC 36FR RT ANG (CATHETERS) ×3 IMPLANT
CNTNR URN SCR LID CUP LEK RST (MISCELLANEOUS) ×3 IMPLANT
CONN ST 1/4X3/8  BEN (MISCELLANEOUS) ×2
CONN ST 1/4X3/8 BEN (MISCELLANEOUS) ×1 IMPLANT
CONN Y 3/8X3/8X3/8  BEN (MISCELLANEOUS) ×2
CONN Y 3/8X3/8X3/8 BEN (MISCELLANEOUS) ×1 IMPLANT
CONT SPEC 4OZ CLIKSEAL STRL BL (MISCELLANEOUS) ×6 IMPLANT
CONT SPEC 4OZ STRL OR WHT (MISCELLANEOUS) ×6
COVER SURGICAL LIGHT HANDLE (MISCELLANEOUS) ×3 IMPLANT
DERMABOND ADVANCED (GAUZE/BANDAGES/DRESSINGS) ×2
DERMABOND ADVANCED .7 DNX12 (GAUZE/BANDAGES/DRESSINGS) ×1 IMPLANT
DRAIN CHANNEL 28F RND 3/8 FF (WOUND CARE) ×3 IMPLANT
DRAPE INCISE IOBAN 66X45 STRL (DRAPES) ×3 IMPLANT
DRAPE LAPAROSCOPIC ABDOMINAL (DRAPES) ×3 IMPLANT
DRAPE WARM FLUID 44X44 (DRAPES) IMPLANT
DRSG XEROFORM 1X8 (GAUZE/BANDAGES/DRESSINGS) ×3 IMPLANT
ELECT BLADE 4.0 EZ CLEAN MEGAD (MISCELLANEOUS) ×3
ELECT BLADE 6.5 EXT (BLADE) ×3 IMPLANT
ELECT REM PT RETURN 9FT ADLT (ELECTROSURGICAL) ×3
ELECTRODE BLDE 4.0 EZ CLN MEGD (MISCELLANEOUS) ×1 IMPLANT
ELECTRODE REM PT RTRN 9FT ADLT (ELECTROSURGICAL) ×1 IMPLANT
GAUZE SPONGE 4X4 12PLY STRL (GAUZE/BANDAGES/DRESSINGS) ×3 IMPLANT
GAUZE XEROFORM 1X8 LF (GAUZE/BANDAGES/DRESSINGS) ×3 IMPLANT
GLOVE BIOGEL M STRL SZ7.5 (GLOVE) ×3 IMPLANT
GLOVE BIOGEL PI IND STRL 7.0 (GLOVE) ×3 IMPLANT
GLOVE BIOGEL PI INDICATOR 7.0 (GLOVE) ×6
GOWN STRL REUS W/ TWL LRG LVL3 (GOWN DISPOSABLE) ×5 IMPLANT
GOWN STRL REUS W/TWL LRG LVL3 (GOWN DISPOSABLE) ×10
KIT BASIN OR (CUSTOM PROCEDURE TRAY) ×3 IMPLANT
KIT SUCTION CATH 14FR (SUCTIONS) ×3 IMPLANT
KIT TURNOVER KIT B (KITS) ×3 IMPLANT
NS IRRIG 1000ML POUR BTL (IV SOLUTION) ×6 IMPLANT
PACK CHEST (CUSTOM PROCEDURE TRAY) ×3 IMPLANT
PAD ARMBOARD 7.5X6 YLW CONV (MISCELLANEOUS) ×6 IMPLANT
SEALANT SURG COSEAL 4ML (VASCULAR PRODUCTS) IMPLANT
SEALANT SURG COSEAL 8ML (VASCULAR PRODUCTS) ×3 IMPLANT
SOL ANTI FOG 6CC (MISCELLANEOUS) ×1 IMPLANT
SOLUTION ANTI FOG 6CC (MISCELLANEOUS) ×2
SPONGE INTESTINAL PEANUT (DISPOSABLE) ×3 IMPLANT
SPONGE LAP 18X18 RF (DISPOSABLE) ×3 IMPLANT
SPONGE TONSIL TAPE 1 RFD (DISPOSABLE) ×3 IMPLANT
SUT CHROMIC 3 0 SH 27 (SUTURE) IMPLANT
SUT CHROMIC 4 0 SH 27 (SUTURE) ×12 IMPLANT
SUT ETHILON 3 0 FSL (SUTURE) ×3 IMPLANT
SUT ETHILON 3 0 PS 1 (SUTURE) IMPLANT
SUT PROLENE 3 0 SH DA (SUTURE) IMPLANT
SUT PROLENE 4 0 RB 1 (SUTURE)
SUT PROLENE 4-0 RB1 .5 CRCL 36 (SUTURE) IMPLANT
SUT PROLENE 6 0 C 1 30 (SUTURE) IMPLANT
SUT SILK  1 MH (SUTURE) ×6
SUT SILK 1 MH (SUTURE) ×3 IMPLANT
SUT SILK 1 TIES 10X30 (SUTURE) IMPLANT
SUT SILK 2 0SH CR/8 30 (SUTURE) IMPLANT
SUT SILK 3 0SH CR/8 30 (SUTURE) IMPLANT
SUT VIC AB 1 CTX 18 (SUTURE) ×3 IMPLANT
SUT VIC AB 2 TP1 27 (SUTURE) ×3 IMPLANT
SUT VIC AB 2-0 CT2 18 VCP726D (SUTURE) IMPLANT
SUT VIC AB 2-0 CTX 36 (SUTURE) ×3 IMPLANT
SUT VIC AB 2-0 UR6 27 (SUTURE) ×3 IMPLANT
SUT VIC AB 3-0 SH 18 (SUTURE) IMPLANT
SUT VIC AB 3-0 X1 27 (SUTURE) ×3 IMPLANT
SUT VICRYL 0 UR6 27IN ABS (SUTURE) IMPLANT
SUT VICRYL 2 TP 1 (SUTURE) IMPLANT
SWAB COLLECTION DEVICE MRSA (MISCELLANEOUS) ×3 IMPLANT
SWAB CULTURE ESWAB REG 1ML (MISCELLANEOUS) ×3 IMPLANT
SYSTEM SAHARA CHEST DRAIN ATS (WOUND CARE) ×3 IMPLANT
TAPE CLOTH SURG 4X10 WHT LF (GAUZE/BANDAGES/DRESSINGS) ×3 IMPLANT
TIP APPLICATOR SPRAY EXTEND 16 (VASCULAR PRODUCTS) IMPLANT
TOWEL GREEN STERILE (TOWEL DISPOSABLE) ×3 IMPLANT
TOWEL GREEN STERILE FF (TOWEL DISPOSABLE) ×3 IMPLANT
TRAP SPECIMEN MUCOUS 40CC (MISCELLANEOUS) ×6 IMPLANT
TRAY FOLEY MTR SLVR 16FR STAT (SET/KITS/TRAYS/PACK) ×3 IMPLANT
WATER STERILE IRR 1000ML POUR (IV SOLUTION) ×6 IMPLANT

## 2019-12-18 NOTE — Transfer of Care (Signed)
Immediate Anesthesia Transfer of Care Note  Patient: Nathaniel Webb  Procedure(s) Performed: VIDEO ASSISTED THORACOSCOPY (VATS)/EMPYEMA (Right Chest)  Patient Location: SICU  Anesthesia Type:General  Level of Consciousness: sedated and Patient remains intubated per anesthesia plan  Airway & Oxygen Therapy: Patient remains intubated per anesthesia plan and Patient placed on Ventilator (see vital sign flow sheet for setting)  Post-op Assessment: Report given to RN and Post -op Vital signs reviewed and stable  Post vital signs: Reviewed and stable  Last Vitals:  Vitals Value Taken Time  BP    Temp    Pulse    Resp    SpO2      Last Pain:  Vitals:   12/18/19 0615  TempSrc: Oral  PainSc:       Patients Stated Pain Goal: 0 (12/09/19 2010)  Complications: No apparent anesthesia complications

## 2019-12-18 NOTE — Brief Op Note (Addendum)
12/18/2019  10:50 AM  PATIENT:  Nathaniel Webb  54 y.o. male  PRE-OPERATIVE DIAGNOSIS:  RIGHT EMPYEMA, Right organized hemothorax  POST-OPERATIVE DIAGNOSIS:  RIGHT EMPYEMA, right organized hemothorax  PROCEDURE:  Procedure(s): VIDEO ASSISTED THORACOSCOPY (VATS)/EMPYEMA (Right) drainage of empyema and evacuation of clot/hemothorax, decortication LLL  SURGEON:  Surgeon(s) and Role:    Kerin Perna, MD - Primary  PHYSICIAN ASSISTANT:  Jari Favre, PA-C   ANESTHESIA:   general  EBL:  500 mL   BLOOD ADMINISTERED: 1 pRBC  DRAINS: posterior angled chest tube, middle bard, anterior straight chest tube   LOCAL MEDICATIONS USED:  NONE  SPECIMEN:  Source of Specimen:  pleural fluid, pleural peel  DISPOSITION OF SPECIMEN:  PATHOLOGY  COUNTS:  YES  DICTATION: .Dragon Dictation  PLAN OF CARE: Admit to inpatient   PATIENT DISPOSITION:  ICU - intubated and hemodynamically stable.   Delay start of Pharmacological VTE agent (>24hrs) due to surgical blood loss or risk of bleeding: yes.   Resume DVT prophylaxis  in 24 hrs   pleural peel and pleural fluid sent for C&S

## 2019-12-18 NOTE — Progress Notes (Signed)
Pre Procedure note for inpatients:   Nathaniel Webb has been scheduled for Procedure(s): VIDEO ASSISTED THORACOSCOPY (VATS)/EMPYEMA (Right) today. The various methods of treatment have been discussed with the patient. After consideration of the risks, benefits and treatment options the patient has consented to the planned procedure.   The patient has been seen and labs reviewed. There are no changes in the patient's condition to prevent proceeding with the planned procedure today.  Recent labs:  Lab Results  Component Value Date   WBC 11.5 (H) 12/18/2019   HGB 9.0 (L) 12/18/2019   HCT 29.0 (L) 12/18/2019   PLT 386 12/18/2019   GLUCOSE 103 (H) 12/18/2019   CHOL 107 02/12/2019   TRIG 212 (H) 12/03/2019   HDL 24 (L) 02/12/2019   LDLCALC 61 02/12/2019   ALT 37 12/16/2019   AST 58 (H) 12/16/2019   NA 140 12/18/2019   K 4.3 12/18/2019   CL 101 12/18/2019   CREATININE 0.92 12/18/2019   BUN 14 12/18/2019   CO2 28 12/18/2019   TSH 1.320 01/24/2018   INR 1.3 (H) 11/26/2019   HGBA1C 7.3 (H) 11/10/2019   MICROALBUR 0.3 11/09/2016    [name], MD 12/18/2019 7:38 AM

## 2019-12-18 NOTE — Anesthesia Procedure Notes (Addendum)
Arterial Line Insertion Start/End2/02/2020 7:15 AM, 12/18/2019 7:25 AM Performed by: Arta Bruce, MD, Quentin Ore, CRNA, CRNA  Patient location: Pre-op. Preanesthetic checklist: patient identified, IV checked, site marked, risks and benefits discussed, surgical consent, monitors and equipment checked, pre-op evaluation, timeout performed and anesthesia consent Lidocaine 1% used for infiltration and patient sedated Left, radial was placed Catheter size: 20 G Hand hygiene performed  and maximum sterile barriers used   Attempts: 2 Procedure performed without using ultrasound guided technique. Following insertion, Biopatch and dressing applied. Post procedure assessment: normal  Patient tolerated the procedure well with no immediate complications. Additional procedure comments: Performed by Raiford Simmonds; LA used prior to procedure.

## 2019-12-18 NOTE — Plan of Care (Signed)
  Problem: Respiratory: Goal: Will maintain a patent airway Outcome: Progressing   

## 2019-12-18 NOTE — Op Note (Signed)
NAMEKHALEEF, RUBY MEDICAL RECORD [id number] ACCOUNT [id number] DATE OF BIRTH:[birth year] FACILITY: [facility] LOCATION: [facility] PHYSICIAN:Ajanay Farve VAN TRIGT III, MD  OPERATIVE REPORT  DATE OF PROCEDURE:  12/18/2019  OPERATION: 1.  Right video-assisted thoracoscopic surgery (VATS) for drainage of right empyema. 2.  Decortication of right lower lobe and removal of right pleural space hematoma.  SURGEON:  Ivin Poot, MD  ASSISTANT:  Nicholes Rough, PA-C.  ANESTHESIA:  General by Dr. Lillia Abed.  PREOPERATIVE DIAGNOSES:   1.  History of COVID pneumonitis. 2.  Methicillin-sensitive Staphylococcus aureus pneumonia infection with methicillin-sensitive Staphylococcus aureus empyema, sepsis and bacteremia. 3.  Past history of obesity, diabetes and dyslipidemia.  POSTOPERATIVE DIAGNOSES:   1.  History of COVID pneumonitis. 2.  Methicillin-sensitive Staphylococcus aureus pneumonia infection with methicillin-sensitive Staphylococcus aureus empyema, sepsis and bacteremia. 3.  Past history of obesity, diabetes and dyslipidemia.  CLINICAL NOTE:  The patient is an obese 54 year old diabetic who was admitted to the hospital just after Christmas with COVID pneumonitis.  He had significant hypoxemia, but was managed without mechanical ventilation.  He developed a staphylococcal super  pneumonia, staphylococcal empyema and staphylococcal bacteremia.  A chest tube was placed for treatment of a hydropneumothorax.  He did not improve with chest tube placement and was transferred to [facility] for further management.  Followup scan  showed persistent hydropneumothorax with probable right pleural hemothorax related to systemic anticoagulation and 1 dose of lytic therapy through the chest tube for improvement of drainage.  He still had significant bilateral viral pneumonitis and was  not a candidate for surgery.  Over the next few weeks, his overall status improved.  His markers for sepsis improved  with long-term antibiotics, including Ancef and previously given antiviral agents.  His chest x-ray showed improvement of the left long and his oxygen requirements  diminished.  He was felt to be a candidate for surgery at that point, including right VATS for drainage of empyema, evacuation of hematoma and reexpansion of the lung to allow the preoperative air leaks to resolve.  I discussed the procedure in detail  with the patient prior to surgery, including the use of general anesthesia, the location of the surgical incision and the expected postoperative hospital recovery.  He understood the risks of bleeding, blood transfusion, persistent infection, persistent  air leak and death.  He agreed to proceed with surgery under what I felt was an informed consent.  OPERATIVE FINDINGS: 1.  Large hemothorax in the subpleural space, possibly related to the previous chest tube insertion. 2.  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 3.  Successful evacuation of hematoma and removal of a pleural peel over the right lower lobe (decortication).  DESCRIPTION OF PROCEDURE:  The patient was brought from the preoperative holding area where informed consent was documented and the proper site was marked.  All final questions were addressed with the patient.  He was placed supine on the operating  table.  General anesthesia was induced.  A double lumen endotracheal tube was placed and positioned by the anesthesia team.  The patient was then turned right side up and the right chest was clipped, prepped and draped as a sterile field.  A proper  time-out was performed.  The previously placed chest tube had been removed.  A small incision was made in the 5th interspace.  The scope camera was inserted, but there was no visualization due to obliteration of the pleural space with adhesions. The main  incision  was then extended.  The pleural space was then carefully entered.  The  patient's chest wall was very thick due to his obesity.  The pleural space adhesions were then carefully and gradually taken down in a tedious careful process.  We  encountered a large hematoma in the subpleural pocket between the lower lobe and the right hemidiaphragm.  This was irrigated and removed.  Portions were sent for culture.  The pleura was examined.  There were some areas of visceral pleural erosion which were treated in areas with figure-of-eight 4-0 Vicryl sutures, as well as topical adhesive application, CoSeal.  The entire right lower lobe, middle lobe and upper lobe  were freed up from the investing adhesions so that the lung could reexpand.  The total chest cavity had been irrigated with copious amounts of warm saline.  The pleural peel removed from the visceral pleura was sent for cultures, as was the pleural  fluid.  Three chest tubes were placed to drain the pleural space and brought out through separate incisions.  The lung was then reexpanded under direct vision.  The incision was closed with 2 pericostal sutures to reapproximate the ribs.  The muscle  layer was closed with interrupted #1 Vicryl.  The subcutaneous layer was closed with a running Vicryl and the skin was closed with a subcuticular.  The chest tubes were secured to the skin and connected to an underwater seal Pleur-Evac drainage system.  The old chest tube site was debrided and closed in layers using Vicryl for the subcutaneous layer and interrupted nylon for the skin.  Sterile dressings were applied.  The patient was then turned supine and had the double lumen endotracheal tube  exchanged for a single lumen tube for prolonged mechanical ventilation support for the next 24 hours.  A chest x-ray was taken in the operating room, which I reviewed, which showed adequate reexpansion of the right lung and adequate placement of the  endotracheal tube and chest tubes.  The patient then returned to the ICU in critical, but  stable condition.  VN/NUANCE  D:12/18/2019 T:12/18/2019 JOB:[id number]

## 2019-12-18 NOTE — Anesthesia Procedure Notes (Signed)
Procedure Name: Intubation Date/Time: 12/18/2019 8:08 AM Performed by: Kyung Rudd, CRNA Pre-anesthesia Checklist: Patient identified, Emergency Drugs available, Suction available and Patient being monitored Patient Re-evaluated:Patient Re-evaluated prior to induction Oxygen Delivery Method: Circle system utilized Preoxygenation: Pre-oxygenation with 100% oxygen Induction Type: IV induction Ventilation: Mask ventilation without difficulty Laryngoscope Size: Mac and 4 Grade View: Grade II Endobronchial tube: Left and Double lumen EBT and 39 Fr Number of attempts: 1 Airway Equipment and Method: Video-laryngoscopy Placement Confirmation: ETT inserted through vocal cords under direct vision and positive ETCO2 Tube secured with: Tape Dental Injury: Teeth and Oropharynx as per pre-operative assessment

## 2019-12-18 NOTE — Progress Notes (Signed)
This nurse assisted transport to bring patient to surgery for VATS procedure this morning.  Transported via bed with oxygen at 3L/[place], on telemetry and with chest tube clamp.  Patient awake and alert and in stable condition.  Report given to CRNA at bedside.

## 2019-12-18 NOTE — Anesthesia Procedure Notes (Signed)
Central Venous Catheter Insertion Performed by: Arta Bruce, MD, anesthesiologist Start/End2/02/2020 7:05 AM, 12/18/2019 7:15 AM Patient location: Pre-op. Preanesthetic checklist: patient identified, IV checked, risks and benefits discussed, surgical consent, monitors and equipment checked, pre-op evaluation, timeout performed and anesthesia consent Position: Trendelenburg Lidocaine 1% used for infiltration and patient sedated Hand hygiene performed  and maximum sterile barriers used  Catheter size: 8 Fr Total catheter length 16. Central line was placed.Double lumen Procedure performed using ultrasound guided technique. Ultrasound Notes:anatomy identified, needle tip was noted to be adjacent to the nerve/plexus identified, no ultrasound evidence of intravascular and/or intraneural injection and image(s) printed for medical record Attempts: 1 Following insertion, dressing applied, line sutured and Biopatch. Post procedure assessment: blood return through all ports, free fluid flow and no air  Patient tolerated the procedure well with no immediate complications.

## 2019-12-18 NOTE — Anesthesia Postprocedure Evaluation (Signed)
Anesthesia Post Note  Patient: Nathaniel Webb  Procedure(s) Performed: VIDEO ASSISTED THORACOSCOPY (VATS)/EMPYEMA (Right Chest)     Patient location during evaluation: PACU Anesthesia Type: General Level of consciousness: awake and alert Pain management: pain level controlled Vital Signs Assessment: post-procedure vital signs reviewed and stable Respiratory status: spontaneous breathing, nonlabored ventilation, respiratory function stable and patient connected to nasal cannula oxygen Cardiovascular status: blood pressure returned to baseline and stable Postop Assessment: no apparent nausea or vomiting Anesthetic complications: no    Last Vitals:  Vitals:   12/18/19 1200 12/18/19 1300  BP: (!) 140/101 (!) 141/103  Pulse:    Resp: (!) 35 (!) 39  Temp:    SpO2: 100% 100%    Last Pain:  Vitals:   12/18/19 0615  TempSrc: Oral  PainSc:                  Jerri Glauser DAVID

## 2019-12-18 NOTE — Progress Notes (Signed)
CT surgery p.m. Rounds Right VATS earlier today for evacuation of hematoma and drainage of empyema and decortication of right lower lobe Patient stable on ventilator P.m. hemoglobin 10.2 with adequate ABG Moderate air leak from chest tubes with minimal serosanguineous output Plan ventilator wean in a.m.

## 2019-12-18 NOTE — Anesthesia Preprocedure Evaluation (Addendum)
Anesthesia Evaluation  Patient identified by MRN, date of birth, ID band Patient awake    Reviewed: Allergy & Precautions, NPO status , Patient's Chart, lab work & pertinent test results  Airway Mallampati: I  TM Distance: >3 FB Neck ROM: Full    Dental   Pulmonary former smoker,    Pulmonary exam normal        Cardiovascular hypertension, Pt. on medications Normal cardiovascular exam     Neuro/Psych    GI/Hepatic   Endo/Other  diabetes, Type 2  Renal/GU      Musculoskeletal   Abdominal   Peds  Hematology   Anesthesia Other Findings   Reproductive/Obstetrics                             Anesthesia Physical Anesthesia Plan  ASA: IV  Anesthesia Plan: General   Post-op Pain Management:    Induction: Intravenous  PONV Risk Score and Plan: 2 and Ondansetron and Midazolam  Airway Management Planned: Double Lumen EBT  Additional Equipment: Arterial line, CVP and Ultrasound Guidance Line Placement  Intra-op Plan:   Post-operative Plan: Possible Post-op intubation/ventilation  Informed Consent: I have reviewed the patients History and Physical, chart, labs and discussed the procedure including the risks, benefits and alternatives for the proposed anesthesia with the patient or authorized representative who has indicated his/her understanding and acceptance.       Plan Discussed with: CRNA and Surgeon  Anesthesia Plan Comments:         Anesthesia Quick Evaluation

## 2019-12-18 NOTE — Progress Notes (Signed)
Social visit, patient remains intubated and sedated. Appreciate Dr. Zenaida Niece Trigt's help. PCCM is available as needed.  Myrla Halsted MD PCCM

## 2019-12-19 ENCOUNTER — Encounter: Payer: Self-pay | Admitting: *Deleted

## 2019-12-19 ENCOUNTER — Inpatient Hospital Stay (HOSPITAL_COMMUNITY): Payer: Medicaid Other

## 2019-12-19 LAB — CBC
HCT: 32.9 % — ABNORMAL LOW (ref 39.0–52.0)
Hemoglobin: 10.4 g/dL — ABNORMAL LOW (ref 13.0–17.0)
MCH: 28.7 pg (ref 26.0–34.0)
MCHC: 31.6 g/dL (ref 30.0–36.0)
MCV: 90.9 fL (ref 80.0–100.0)
Platelets: 419 10*3/uL — ABNORMAL HIGH (ref 150–400)
RBC: 3.62 MIL/uL — ABNORMAL LOW (ref 4.22–5.81)
RDW: 16.3 % — ABNORMAL HIGH (ref 11.5–15.5)
WBC: 12.4 10*3/uL — ABNORMAL HIGH (ref 4.0–10.5)
nRBC: 0 % (ref 0.0–0.2)

## 2019-12-19 LAB — TRIGLYCERIDES: Triglycerides: 227 mg/dL — ABNORMAL HIGH (ref <150)

## 2019-12-19 LAB — POCT I-STAT 7, (LYTES, BLD GAS, ICA,H+H)
Acid-Base Excess: 1 mmol/L (ref 0.0–2.0)
Acid-Base Excess: 1 mmol/L (ref 0.0–2.0)
Acid-Base Excess: 1 mmol/L (ref 0.0–2.0)
Bicarbonate: 26.5 mmol/L (ref 20.0–28.0)
Bicarbonate: 25.1 mmol/L (ref 20.0–28.0)
Bicarbonate: 25 mmol/L (ref 20.0–28.0)
Calcium, Ion: 1.16 mmol/L (ref 1.15–1.40)
Calcium, Ion: 1.12 mmol/L — ABNORMAL LOW (ref 1.15–1.40)
Calcium, Ion: 1.12 mmol/L — ABNORMAL LOW (ref 1.15–1.40)
HCT: 30 % — ABNORMAL LOW (ref 39.0–52.0)
HCT: 33 % — ABNORMAL LOW (ref 39.0–52.0)
HCT: 31 % — ABNORMAL LOW (ref 39.0–52.0)
Hemoglobin: 10.2 g/dL — ABNORMAL LOW (ref 13.0–17.0)
Hemoglobin: 11.2 g/dL — ABNORMAL LOW (ref 13.0–17.0)
Hemoglobin: 10.5 g/dL — ABNORMAL LOW (ref 13.0–17.0)
O2 Saturation: 99 %
O2 Saturation: 97 %
O2 Saturation: 98 %
Patient temperature: 99.1
Patient temperature: 99.8
Patient temperature: 98.8
Potassium: 4.4 mmol/L (ref 3.5–5.1)
Potassium: 4.2 mmol/L (ref 3.5–5.1)
Potassium: 4.2 mmol/L (ref 3.5–5.1)
Sodium: 140 mmol/L (ref 135–145)
Sodium: 140 mmol/L (ref 135–145)
Sodium: 140 mmol/L (ref 135–145)
TCO2: 28 mmol/L (ref 22–32)
TCO2: 26 mmol/L (ref 22–32)
TCO2: 26 mmol/L (ref 22–32)
pCO2 arterial: 43.9 mmHg (ref 32.0–48.0)
pCO2 arterial: 38 mmHg (ref 32.0–48.0)
pCO2 arterial: 36.7 mmHg (ref 32.0–48.0)
pH, Arterial: 7.389 (ref 7.350–7.450)
pH, Arterial: 7.43 (ref 7.350–7.450)
pH, Arterial: 7.442 (ref 7.350–7.450)
pO2, Arterial: 136 mmHg — ABNORMAL HIGH (ref 83.0–108.0)
pO2, Arterial: 96 mmHg (ref 83.0–108.0)
pO2, Arterial: 108 mmHg (ref 83.0–108.0)

## 2019-12-19 LAB — ACID FAST SMEAR (AFB, MYCOBACTERIA)
Acid Fast Smear: NEGATIVE
Acid Fast Smear: NEGATIVE

## 2019-12-19 LAB — GLUCOSE, CAPILLARY
Glucose-Capillary: 127 mg/dL — ABNORMAL HIGH (ref 70–99)
Glucose-Capillary: 147 mg/dL — ABNORMAL HIGH (ref 70–99)
Glucose-Capillary: 121 mg/dL — ABNORMAL HIGH (ref 70–99)
Glucose-Capillary: 120 mg/dL — ABNORMAL HIGH (ref 70–99)
Glucose-Capillary: 105 mg/dL — ABNORMAL HIGH (ref 70–99)

## 2019-12-19 LAB — BASIC METABOLIC PANEL
Anion gap: 11 (ref 5–15)
BUN: 12 mg/dL (ref 6–20)
CO2: 24 mmol/L (ref 22–32)
Calcium: 8.1 mg/dL — ABNORMAL LOW (ref 8.9–10.3)
Chloride: 105 mmol/L (ref 98–111)
Creatinine, Ser: 0.89 mg/dL (ref 0.61–1.24)
GFR calc Af Amer: >60 mL/min (ref >60)
GFR calc non Af Amer: >60 mL/min (ref >60)
Glucose, Bld: 152 mg/dL — ABNORMAL HIGH (ref 70–99)
Potassium: 4.4 mmol/L (ref 3.5–5.1)
Sodium: 140 mmol/L (ref 135–145)

## 2019-12-19 LAB — CYTOLOGY - NON PAP

## 2019-12-19 LAB — SURGICAL PATHOLOGY

## 2019-12-19 MED ORDER — NALOXONE HCL 0.4 MG/ML IJ SOLN: 0.4000 mg | INTRAMUSCULAR | Status: DC | PRN

## 2019-12-19 MED ORDER — ONDANSETRON HCL 4 MG/2ML IJ SOLN: 4.0000 mg | Freq: Four times a day (QID) | INTRAMUSCULAR | Status: DC | PRN

## 2019-12-19 MED ORDER — DIPHENHYDRAMINE HCL 12.5 MG/5ML PO ELIX: 12.5000 mg | ORAL_SOLUTION | Freq: Four times a day (QID) | ORAL | Status: DC | PRN

## 2019-12-19 MED ORDER — FENTANYL 50 MCG/ML IV PCA SOLN
INTRAVENOUS | Status: DC
  Administered 2019-12-19: 60 ug via INTRAVENOUS
  Administered 2019-12-19: 30 ug via INTRAVENOUS
  Administered 2019-12-19: 105 ug via INTRAVENOUS
  Administered 2019-12-20: 9 ug via INTRAVENOUS
  Administered 2019-12-20: 120 ug via INTRAVENOUS
  Administered 2019-12-20: 75 ug via INTRAVENOUS
  Administered 2019-12-20: 30 ug via INTRAVENOUS
  Administered 2019-12-20: 60 ug via INTRAVENOUS
  Administered 2019-12-21: 180 ug via INTRAVENOUS
  Administered 2019-12-21: 90 ug via INTRAVENOUS
  Administered 2019-12-21: 180 ug via INTRAVENOUS
  Administered 2019-12-21: 105 ug via INTRAVENOUS
  Administered 2019-12-22: 165 ug via INTRAVENOUS
  Administered 2019-12-22: 180 ug via INTRAVENOUS
  Administered 2019-12-22: 120 ug via INTRAVENOUS
  Administered 2019-12-22: 102 ug via INTRAVENOUS
  Administered 2019-12-22: 150 ug via INTRAVENOUS
  Administered 2019-12-22 (×2): 105 ug via INTRAVENOUS
  Administered 2019-12-23: 75 ug via INTRAVENOUS
  Administered 2019-12-23: 120 ug via INTRAVENOUS
  Administered 2019-12-23: 105 ug via INTRAVENOUS
  Administered 2019-12-24: 90 ug via INTRAVENOUS
  Administered 2019-12-24: 150 ug via INTRAVENOUS
  Administered 2019-12-24: 60 ug via INTRAVENOUS
  Administered 2019-12-25: 180 ug via INTRAVENOUS
  Administered 2019-12-25: 150 ug via INTRAVENOUS
  Administered 2019-12-25: 105 ug via INTRAVENOUS
  Administered 2019-12-25: 90 ug via INTRAVENOUS
  Administered 2019-12-25: 120 ug via INTRAVENOUS
  Administered 2019-12-25: 75 ug via INTRAVENOUS
  Administered 2019-12-25: 90 ug via INTRAVENOUS
  Administered 2019-12-26: 45 ug via INTRAVENOUS
  Administered 2019-12-26: 75 ug via INTRAVENOUS
  Administered 2019-12-26: 60 ug via INTRAVENOUS
  Administered 2019-12-26: 30 ug via INTRAVENOUS
  Administered 2019-12-26: 150 ug via INTRAVENOUS
  Administered 2019-12-26: 135 ug via INTRAVENOUS
  Administered 2019-12-27: 15 ug via INTRAVENOUS
  Filled 2019-12-19 (×6): qty 20

## 2019-12-19 MED ORDER — GUAIFENESIN ER 600 MG PO TB12
600.0000 mg | ORAL_TABLET | Freq: Two times a day (BID) | ORAL | Status: DC
  Administered 2019-12-19 – 2020-01-07 (×37): 600 mg via ORAL
  Filled 2019-12-19 (×37): qty 1

## 2019-12-19 MED ORDER — IPRATROPIUM-ALBUTEROL 0.5-2.5 (3) MG/3ML IN SOLN
3.0000 mL | Freq: Three times a day (TID) | RESPIRATORY_TRACT | Status: DC
  Administered 2019-12-19 – 2019-12-20 (×2): 3 mL via RESPIRATORY_TRACT
  Filled 2019-12-19 (×2): qty 3

## 2019-12-19 MED ORDER — DIPHENHYDRAMINE HCL 50 MG/ML IJ SOLN: 12.5000 mg | Freq: Four times a day (QID) | INTRAMUSCULAR | Status: DC | PRN

## 2019-12-19 MED ORDER — ALBUTEROL SULFATE (2.5 MG/3ML) 0.083% IN NEBU: 2.5000 mg | INHALATION_SOLUTION | RESPIRATORY_TRACT | Status: DC | PRN

## 2019-12-19 MED ORDER — SODIUM CHLORIDE 0.9% FLUSH: 9.0000 mL | INTRAVENOUS | Status: DC | PRN

## 2019-12-19 MED ORDER — ORAL CARE MOUTH RINSE
15.0000 mL | Freq: Two times a day (BID) | OROMUCOSAL | Status: DC
  Administered 2019-12-20 – 2020-01-06 (×33): 15 mL via OROMUCOSAL

## 2019-12-19 MED ORDER — FENTANYL 50 MCG/ML IV PCA SOLN
INTRAVENOUS | Status: DC
  Filled 2019-12-19: qty 20

## 2019-12-19 MED ORDER — FUROSEMIDE 10 MG/ML IJ SOLN
20.0000 mg | Freq: Once | INTRAMUSCULAR | Status: AC
  Administered 2019-12-19: 20 mg via INTRAVENOUS
  Filled 2019-12-19: qty 2

## 2019-12-19 MED ORDER — CEFAZOLIN SODIUM-DEXTROSE 2-4 GM/100ML-% IV SOLN
2.0000 g | Freq: Three times a day (TID) | INTRAVENOUS | Status: AC
  Administered 2019-12-19 – 2019-12-25 (×17): 2 g via INTRAVENOUS
  Filled 2019-12-19 (×21): qty 100

## 2019-12-19 NOTE — Progress Notes (Signed)
TCTS Progress Note  POD # 1 s/p R VATS decort  Up in chair; talking on phone Decreased BS right BP (!) 139/100 (BP Location: Left Arm)   Pulse 81   Temp 100 F (37.8 C) (Oral)   Resp (!) 32   Ht 5\' 5"  (1.651 m)   Wt 91.7 kg   SpO2 98%   BMI 33.64 kg/m     Intake/Output Summary (Last 24 hours) at 12/19/2019 1745 Last data filed at 12/19/2019 1700 Gross per 24 hour  Intake 6239.85 ml  Output 2950 ml  Net 3289.85 ml  a/p: continue present management Consider txfer to progressive tmrrw. Dianna Deshler Z. 02/16/2020, MD [phone]

## 2019-12-19 NOTE — Progress Notes (Signed)
Physical Therapy Treatment Patient Details Name: NETHANIEL MATTIE MRN: [id number] DOB: 25-Mar-1966 Today's Date: 12/19/2019    History of Present Illness 54 y/o male w/ hx of Obesity, low HDL, HTN, DM, presented to [facility] (12/29) with approximately 1 week history of gradually worsening shortness of breath.  He was found to have acute hypoxic respiratory failure and was requiring high flow oxygen to maintain O2 saturations.  Chest x-ray was typical of Covid pneumonitis. Pt dx w/ COVID 12/23 and dc home w/ spouse. 1/10 CXR showed ARDS bilateral increasing opacification; respiratory status worsened; concerns for aspiration. Acute PE; loculated right pleural effusion; 1/11 CTS consult, recs chest tube. 1/12 CT chest with densely consolidated right lung, air fluid level/ hydropneumothorax. Thoracentesis 1/12; pleural fluid sampled and concerning for empyema.  1/13 emergently transferred to ICU for worsening hypoxia; CT showed worsening right hydropneumothorax with loculations in pleura. Emergent right chest tube placed. Pt intubated 11/30/19-12/01/19. Pt developed hematuria on 1/21. Pt underwent VATS on 2/4, intubated post-proceudre and extubated on 2/5.    PT Comments    Pt tolerated treatment well s/p VATS on 2/4 and recent extubation. Pt demonstrates the ability to tolerate multiple bouts of standing and ambulation this session over short distances. Pt requires minimal physical assistance during session at this time. Pt will benefit from continued acute PT POC to improve activity tolerance and progress ambulation distances.    Follow Up Recommendations  CIR     Equipment Recommendations  Rolling walker with 5" wheels;3in1 (PT)    Recommendations for Other Services       Precautions / Restrictions Precautions Precautions: Fall Precaution Comments: Rt sided Chest tubes, monitor SpO2 and RR Restrictions Weight Bearing Restrictions: No    Mobility  Bed Mobility Overal bed mobility: (pt received and  left in recliner)                Transfers Overall transfer level: Needs assistance Equipment used: Rolling walker (2 wheeled) Transfers: Sit to/from Stand Sit to Stand: Min assist         General transfer comment: pt performs sit to stand 3 times during session requiring minA for all 3  Ambulation/Gait Ambulation/Gait assistance: Min guard Gait Distance (Feet): 5 Feet(5' forward and backward and 2' forward and back) Assistive device: Rolling walker (2 wheeled) Gait Pattern/deviations: Step-to pattern Gait velocity: reduced Gait velocity interpretation: <1.8 ft/sec, indicate of risk for recurrent falls General Gait Details: pt with short step to gait with reduced foot clearance   Stairs             Wheelchair Mobility    Modified Rankin (Stroke Patients Only)       Balance Overall balance assessment: Needs assistance Sitting-balance support: No upper extremity supported;Feet supported Sitting balance-Leahy Scale: Good Sitting balance - Comments: supervision   Standing balance support: Bilateral upper extremity supported Standing balance-Leahy Scale: Fair Standing balance comment: minG with BUE support of RW                            Cognition Arousal/Alertness: Awake/alert Behavior During Therapy: WFL for tasks assessed/performed Overall Cognitive Status: Within Functional Limits for tasks assessed                                        Exercises      General Comments General comments (skin integrity, edema, etc.):  pt tachycardic and tachypneic at rest with HR at 120 and RR in mid-30s. Pt HR elevating up to 135 during mobility, desats to 90% with mobility, and RR increases to max of 48 with ambulation. PT providing verbal cues to slow RR and increase volume of breaths, pt recovering within 1 minute after each bout of activity.      Pertinent Vitals/Pain Pain Assessment: Faces Faces Pain Scale: Hurts even more Pain  Location: chest and thorax during coughing Pain Descriptors / Indicators: Discomfort;Sore Pain Intervention(s): PCA encouraged    Home Living                      Prior Function            PT Goals (current goals can now be found in the care plan section) Acute Rehab PT Goals Patient Stated Goal: to go home Progress towards PT goals: Progressing toward goals    Frequency    Min 3X/week      PT Plan Current plan remains appropriate    Co-evaluation              AM-PAC PT "6 Clicks" Mobility   Outcome Measure  Help needed turning from your back to your side while in a flat bed without using bedrails?: A Little Help needed moving from lying on your back to sitting on the side of a flat bed without using bedrails?: A Little Help needed moving to and from a bed to a chair (including a wheelchair)?: A Little Help needed standing up from a chair using your arms (e.g., wheelchair or bedside chair)?: A Little Help needed to walk in hospital room?: A Little Help needed climbing 3-5 steps with a railing? : A Lot 6 Click Score: 17    End of Session Equipment Utilized During Treatment: Oxygen Activity Tolerance: Patient tolerated treatment well Patient left: in chair;with call bell/phone within reach Nurse Communication: Mobility status PT Visit Diagnosis: Unsteadiness on feet (R26.81);Muscle weakness (generalized) (M62.81)     Time: 5093-2671 PT Time Calculation (min) (ACUTE ONLY): 17 min  Charges:  $Gait Training: 8-22 mins                     Zenaida Niece, PT, DPT Acute Rehabilitation Pager: [phone]    Zenaida Niece 12/19/2019, 4:39 PM

## 2019-12-19 NOTE — Progress Notes (Signed)
150 mL of fentanyl wasted in steri-container witnessed by Josiah Lobo RN.

## 2019-12-19 NOTE — Procedures (Signed)
Extubation Procedure Note  Patient Details:   Name: CRISPIN VOGEL DOB: 11/05/1966 MRN: [id number]   Airway Documentation:    Vent end date: 12/19/19 Vent end time: 0950   Evaluation  O2 sats: stable throughout Complications: No apparent complications Patient did tolerate procedure well. Bilateral Breath Sounds: Coarse crackles   Yes  Placed n 6l/min [place] initially Incentive spirometer instructed NIF-60 FVC-672ml  Newt Lukes 12/19/2019, 9:51 AM

## 2019-12-19 NOTE — Progress Notes (Addendum)
T[facility] DAILY ICU PROGRESS NOTE                   [address]            [place] [address]          [phone]   1 Day Post-Op Procedure(s) (LRB): VIDEO ASSISTED THORACOSCOPY (VATS)/EMPYEMA (Right)  Total Length of Stay:  LOS: 38 days   Subjective: Patient is alert on the vent and nods in understanding.   Objective: Vital signs in last 24 hours: Temp:  [98.3 F (36.8 C)-99.8 F (37.7 C)] 99.1 F (37.3 C) (02/05 0300) Pulse Rate:  [81-84] 81 (02/04 1520) Cardiac Rhythm: Normal sinus rhythm (02/05 0430) Resp:  [21-40] 27 (02/05 0700) BP: (118-156)/(90-107) 133/90 (02/05 0700) SpO2:  [96 %-100 %] 96 % (02/05 0700) Arterial Line BP: (125-187)/(73-111) 132/73 (02/05 0700) FiO2 (%):  [30 %-50 %] 30 % (02/05 0438)  Filed Weights   12/01/19 0431 12/02/19 0500 12/05/19 0510  Weight: 92.9 kg 89.9 kg 91.7 kg    Weight change:       Intake/Output from previous day: 02/04 0701 - 02/05 0700 In: 5813.6 [I.V.:3387.3; Blood:315; IV Piggyback:2111.3] Out: 3240 [Urine:2230; Blood:500; Chest Tube:510]  Intake/Output this shift: No intake/output data recorded.  Current Meds: Scheduled Meds: . acetaminophen  1,000 mg Oral Q6H   Or  . acetaminophen (TYLENOL) oral liquid 160 mg/5 mL  1,000 mg Oral Q6H  . atorvastatin  10 mg Oral QPM  . bisacodyl  10 mg Oral Daily  . chlorhexidine gluconate (MEDLINE KIT)  15 mL Mouth Rinse BID  . Chlorhexidine Gluconate Cloth  6 each Topical Daily  . fentaNYL   Intravenous Q4H  . insulin aspart  0-24 Units Subcutaneous Q4H  . ipratropium-albuterol  3 mL Nebulization Q4H  . mouth rinse  15 mL Mouth Rinse 10 times per day  . senna-docusate  1 tablet Oral QHS  . sodium chloride flush  10-40 mL Intracatheter Q12H   Continuous Infusions: . sodium chloride    . sodium chloride 100 mL/hr at 12/19/19 0032  .  ceFAZolin (ANCEF) IV 2 g (12/19/19 0502)  . famotidine (PEPCID) IV 20 mg (12/19/19 0035)  . fentaNYL infusion INTRAVENOUS 50  mcg/hr (12/18/19 1222)  . propofol (DIPRIVAN) infusion 39.985 mcg/kg/min (12/19/19 0250)   PRN Meds:.Place/Maintain arterial line **AND** sodium chloride, diphenhydrAMINE **OR** diphenhydrAMINE, fentaNYL, influenza vac split quadrivalent PF, naloxone **AND** sodium chloride flush, ondansetron (ZOFRAN) IV, ondansetron (ZOFRAN) IV, oxyCODONE, sodium chloride flush, traMADol  General appearance: alert, cooperative and no distress Heart: sinus tachycardia Lungs: diffuse rhonchi R>L Abdomen: soft, non-tender; bowel sounds normal; no masses,  no organomegaly Extremities: extremities normal, atraumatic, no cyanosis or edema Wound: clean and dry  Lab Results: CBC: Recent Labs    12/18/19 1622 12/18/19 1622 12/19/19 0438 12/19/19 0505  WBC 13.5*  --   --  12.4*  HGB 10.9*   < > 10.2* 10.4*  HCT 33.5*   < > 30.0* 32.9*  PLT 421*  --   --  419*   < > = values in this interval not displayed.   BMET:  Recent Labs    12/18/19 0245 12/18/19 0956 12/19/19 0438 12/19/19 0505  NA 140   < > 140 140  K 4.3   < > 4.4 4.4  CL 101  --   --  105  CO2 28  --   --  24  GLUCOSE 103*  --   --  152*  BUN 14  --   --  12  CREATININE 0.92  --   --  0.89  CALCIUM 8.2*  --   --  8.1*   < > = values in this interval not displayed.    CMET: Lab Results  Component Value Date   WBC 12.4 (H) 12/19/2019   HGB 10.4 (L) 12/19/2019   HCT 32.9 (L) 12/19/2019   PLT 419 (H) 12/19/2019   GLUCOSE 152 (H) 12/19/2019   CHOL 107 02/12/2019   TRIG 227 (H) 12/19/2019   HDL 24 (L) 02/12/2019   LDLCALC 61 02/12/2019   ALT 37 12/16/2019   AST 58 (H) 12/16/2019   NA 140 12/19/2019   K 4.4 12/19/2019   CL 105 12/19/2019   CREATININE 0.89 12/19/2019   BUN 12 12/19/2019   CO2 24 12/19/2019   TSH 1.320 01/24/2018   INR 1.3 (H) 11/26/2019   HGBA1C 7.3 (H) 11/10/2019   MICROALBUR 0.3 11/09/2016      PT/INR: No results for input(s): LABPROT, INR in the last 72 hours. Radiology: DG Chest Portable 1  View  Result Date: 12/18/2019 CLINICAL DATA:  Intubated, prior abnormal chest x-ray EXAM: PORTABLE CHEST 1 VIEW COMPARISON:  12/18/2019 at 5:05 a.m. FINDINGS: Single frontal view of the chest demonstrates interval placement of an endotracheal tube overlying tracheal air column, tip midway between thoracic inlet and carina. Enteric catheter passes below diaphragm tip projects over gastric fundus. The side port projects approximately 3 cm above the gastroesophageal junction. Right internal jugular catheter has been placed, tip overlying superior vena cava. There are now 3 right-sided chest tubes. Evidence of a small subpulmonic right pneumothorax. Volume estimated less than 5%. Decreased right pleural effusion. Extensive bilateral interstitial and alveolar opacities unchanged. No acute bony abnormalities. IMPRESSION: 1. Small sub pulmonic right pneumothorax, with multiple chest tubes in place as above. No tension effect. 2. No complication after intubation. 3. Diffuse bilateral pneumonia unchanged. Electronically Signed   By: Randa Ngo M.D.   On: 12/18/2019 11:27     Assessment/Plan: S/P Procedure(s) (LRB): VIDEO ASSISTED THORACOSCOPY (VATS)/EMPYEMA (Right)  1. CV-sinus tachycardia, ASA not restarted. Continue statin.  2. Pulm-intubated/ lightly sedated. FiO2 30%, PEEP 5. CXR improved from yesterdays post surgical study. Large air leak this morning with tidaling.  3. Renal- creatinine 0.89, electrolytes okay 4. H and H 10.4/32.9, s/p 1 unit of pRBC in the OR 5. Endo-blood glucose well controlled 6. ID- will continue to follow pleural peel and pleural fluid cultures/path. Continue Ancef q 8 hours.  Plan: holding anticoagulation. Continue to wean towards extubation. Continue antibiotics.  Making good progress POD1  Elgie Collard 12/19/2019 8:04 AM   Responsive on the ventilator, pressure support wean in progress Chest x-ray shows improved expansion of the right lung Minimal serosanguineous  chest tube drainage, persistent air leak postop  Goal is extubation and mobilization out of bed.  Patient will need physical therapy because he is unable to walk more than 10 steps max.  Will transition to PCA once extubated  Operative Gram stain shows abundant white cells no organisms with cultures pending.  Continue perioperative antibiotics  patient examined and medical record reviewed,agree with above note. Tharon Aquas Trigt III 12/19/2019

## 2019-12-20 ENCOUNTER — Inpatient Hospital Stay (HOSPITAL_COMMUNITY): Payer: Medicaid Other

## 2019-12-20 LAB — GLUCOSE, CAPILLARY
Glucose-Capillary: 102 mg/dL — ABNORMAL HIGH (ref 70–99)
Glucose-Capillary: 105 mg/dL — ABNORMAL HIGH (ref 70–99)
Glucose-Capillary: 106 mg/dL — ABNORMAL HIGH (ref 70–99)
Glucose-Capillary: 108 mg/dL — ABNORMAL HIGH (ref 70–99)
Glucose-Capillary: 88 mg/dL (ref 70–99)
Glucose-Capillary: 90 mg/dL (ref 70–99)
Glucose-Capillary: 99 mg/dL (ref 70–99)

## 2019-12-20 LAB — BPAM RBC
Blood Product Expiration Date: 202102242359
Blood Product Expiration Date: 202103012359
Blood Product Expiration Date: 202103012359
ISSUE DATE / TIME: 202102021654
ISSUE DATE / TIME: 202102040739
ISSUE DATE / TIME: 202102040739
Unit Type and Rh: 7300
Unit Type and Rh: 7300
Unit Type and Rh: 7300

## 2019-12-20 LAB — TYPE AND SCREEN
ABO/RH(D): B POS
Antibody Screen: NEGATIVE
Unit division: 0
Unit division: 0
Unit division: 0

## 2019-12-20 LAB — COMPREHENSIVE METABOLIC PANEL
ALT: 15 U/L (ref 0–44)
AST: 28 U/L (ref 15–41)
Albumin: 1.8 g/dL — ABNORMAL LOW (ref 3.5–5.0)
Alkaline Phosphatase: 94 U/L (ref 38–126)
Anion gap: 11 (ref 5–15)
BUN: 11 mg/dL (ref 6–20)
CO2: 23 mmol/L (ref 22–32)
Calcium: 7.8 mg/dL — ABNORMAL LOW (ref 8.9–10.3)
Chloride: 104 mmol/L (ref 98–111)
Creatinine, Ser: 0.84 mg/dL (ref 0.61–1.24)
GFR calc Af Amer: >60 mL/min (ref >60)
GFR calc non Af Amer: >60 mL/min (ref >60)
Glucose, Bld: 99 mg/dL (ref 70–99)
Potassium: 3.9 mmol/L (ref 3.5–5.1)
Sodium: 138 mmol/L (ref 135–145)
Total Bilirubin: 0.5 mg/dL (ref 0.3–1.2)
Total Protein: 5.7 g/dL — ABNORMAL LOW (ref 6.5–8.1)

## 2019-12-20 LAB — CBC
HCT: 27.7 % — ABNORMAL LOW (ref 39.0–52.0)
Hemoglobin: 8.6 g/dL — ABNORMAL LOW (ref 13.0–17.0)
MCH: 28.1 pg (ref 26.0–34.0)
MCHC: 31 g/dL (ref 30.0–36.0)
MCV: 90.5 fL (ref 80.0–100.0)
Platelets: 334 10*3/uL (ref 150–400)
RBC: 3.06 MIL/uL — ABNORMAL LOW (ref 4.22–5.81)
RDW: 16.4 % — ABNORMAL HIGH (ref 11.5–15.5)
WBC: 10.9 10*3/uL — ABNORMAL HIGH (ref 4.0–10.5)
nRBC: 0 % (ref 0.0–0.2)

## 2019-12-20 MED ORDER — LEVALBUTEROL HCL 1.25 MG/0.5ML IN NEBU: 1.2500 mg | INHALATION_SOLUTION | Freq: Four times a day (QID) | RESPIRATORY_TRACT | Status: DC

## 2019-12-20 MED ORDER — LEVALBUTEROL HCL 1.25 MG/0.5ML IN NEBU
1.2500 mg | INHALATION_SOLUTION | Freq: Four times a day (QID) | RESPIRATORY_TRACT | Status: DC
  Administered 2019-12-20 – 2019-12-21 (×4): 1.25 mg via RESPIRATORY_TRACT
  Filled 2019-12-20 (×4): qty 0.5

## 2019-12-20 MED ORDER — FUROSEMIDE 10 MG/ML IJ SOLN
20.0000 mg | Freq: Once | INTRAMUSCULAR | Status: AC
  Administered 2019-12-20: 20 mg via INTRAVENOUS
  Filled 2019-12-20: qty 2

## 2019-12-20 NOTE — Progress Notes (Signed)
2 Days Post-Op Procedure(s) (LRB): VIDEO ASSISTED THORACOSCOPY (VATS)/EMPYEMA (Right) Subjective: No complaints  Objective: Vital signs in last 24 hours: Temp:  [97.7 F (36.5 C)-100 F (37.8 C)] 97.7 F (36.5 C) (02/06 0825) Cardiac Rhythm: Sinus tachycardia;Normal sinus rhythm (02/06 0800) Resp:  [17-48] 25 (02/06 0800) BP: (114-147)/(72-107) 147/77 (02/06 0800) SpO2:  [90 %-100 %] 98 % (02/06 0812) Arterial Line BP: (118-141)/(61-84) 141/71 (02/05 1330) Weight:  [93.1 kg] 93.1 kg (02/06 0650)  Hemodynamic parameters for last 24 hours:    Intake/Output from previous day: 02/05 0701 - 02/06 0700 In: 3779.8 [P.O.:1080; I.V.:2199.8; IV Piggyback:450] Out: 2770 [Urine:2330; Chest Tube:440] Intake/Output this shift: Total I/O In: 99.1 [I.V.:99.1] Out: 50 [Chest Tube:50]  General appearance: alert and cooperative Neurologic: intact Heart: regular rate and rhythm, S1, S2 normal, no murmur, click, rub or gallop Lungs: diminished breath sounds bilaterally Abdomen: soft, non-tender; bowel sounds normal; no masses,  no organomegaly Extremities: edema 1+ Wound: c/d/i  Lab Results: Recent Labs    12/19/19 0505 12/19/19 0935 12/19/19 1049 12/20/19 0410  WBC 12.4*  --   --  10.9*  HGB 10.4*   < > 10.5* 8.6*  HCT 32.9*   < > 31.0* 27.7*  PLT 419*  --   --  334   < > = values in this interval not displayed.   BMET:  Recent Labs    12/19/19 0505 12/19/19 0935 12/19/19 1049 12/20/19 0410  NA 140   < > 140 138  K 4.4   < > 4.2 3.9  CL 105  --   --  104  CO2 24  --   --  23  GLUCOSE 152*  --   --  99  BUN 12  --   --  11  CREATININE 0.89  --   --  0.84  CALCIUM 8.1*  --   --  7.8*   < > = values in this interval not displayed.    PT/INR: No results for input(s): LABPROT, INR in the last 72 hours. ABG    Component Value Date/Time   PHART 7.442 12/19/2019 1049   HCO3 25.0 12/19/2019 1049   TCO2 26 12/19/2019 1049   ACIDBASEDEF 4.1 (H) 11/09/2019 2210   O2SAT  98.0 12/19/2019 1049   CBG (last 3)  Recent Labs    12/20/19 0042 12/20/19 0416 12/20/19 0649  GLUCAP 99 88 102*    Assessment/Plan: S/P Procedure(s) (LRB): VIDEO ASSISTED THORACOSCOPY (VATS)/EMPYEMA (Right) Mobilize Diuresis chest tubes examined; no evidence that tubes have pulled back-they remain in good position  PA and lateral CXR tomorrow   LOS: 39 days    Nathaniel Webb 12/20/2019

## 2019-12-20 NOTE — Progress Notes (Signed)
Received call from radiology noting am chest Xray shows increased subcutaneous air and 1 CT positioned with lower side hole lying in chest wall. Pt with known large air leak on sahara system, unchanged, sats stable and n/c weaned during shift. No palpable crepitus noted. Dr Vickey Sages, on call CT surgeon notified.

## 2019-12-20 NOTE — Plan of Care (Signed)
Stable during shift. Able to wean oxygen, encouraging TCDB and IS. Slept well. Using Fentanyl PCA appropriately, prn ultram x 1, pain with exertion/coughing but endorses pain control.   Foley out. CT with air leak, subcutaneous air around R clavicle. OOB to recliner.    Problem: Education: Goal: Knowledge of risk factors and measures for prevention of condition will improve Outcome: Progressing   Problem: Coping: Goal: Psychosocial and spiritual needs will be supported Outcome: Progressing   Problem: Respiratory: Goal: Will maintain a patent airway Outcome: Progressing Goal: Complications related to the disease process, condition or treatment will be avoided or minimized Outcome: Progressing   Problem: Education: Goal: Knowledge of General Education information will improve Description: Including pain rating scale, medication(s)/side effects and non-pharmacologic comfort measures Outcome: Progressing   Problem: Health Behavior/Discharge Planning: Goal: Ability to manage health-related needs will improve Outcome: Progressing   Problem: Clinical Measurements: Goal: Ability to maintain clinical measurements within normal limits will improve Outcome: Progressing Goal: Will remain free from infection Outcome: Progressing Goal: Diagnostic test results will improve Outcome: Progressing Goal: Respiratory complications will improve Outcome: Progressing Goal: Cardiovascular complication will be avoided Outcome: Progressing   Problem: Activity: Goal: Risk for activity intolerance will decrease Outcome: Progressing   Problem: Nutrition: Goal: Adequate nutrition will be maintained Outcome: Progressing   Problem: Coping: Goal: Level of anxiety will decrease Outcome: Progressing   Problem: Elimination: Goal: Will not experience complications related to bowel motility Outcome: Progressing Goal: Will not experience complications related to urinary retention Outcome:  Progressing   Problem: Pain Managment: Goal: General experience of comfort will improve Outcome: Progressing   Problem: Safety: Goal: Ability to remain free from injury will improve Outcome: Progressing   Problem: Skin Integrity: Goal: Risk for impaired skin integrity will decrease Outcome: Progressing

## 2019-12-20 NOTE — Progress Notes (Signed)
On reassessment, small amount of palpable crepetis noted R upper anterior chest wall.

## 2019-12-21 ENCOUNTER — Inpatient Hospital Stay (HOSPITAL_COMMUNITY): Payer: Medicaid Other

## 2019-12-21 LAB — CBC
HCT: 26.6 % — ABNORMAL LOW (ref 39.0–52.0)
Hemoglobin: 8.4 g/dL — ABNORMAL LOW (ref 13.0–17.0)
MCH: 28.3 pg (ref 26.0–34.0)
MCHC: 31.6 g/dL (ref 30.0–36.0)
MCV: 89.6 fL (ref 80.0–100.0)
Platelets: 346 10*3/uL (ref 150–400)
RBC: 2.97 MIL/uL — ABNORMAL LOW (ref 4.22–5.81)
RDW: 16.4 % — ABNORMAL HIGH (ref 11.5–15.5)
WBC: 8.3 10*3/uL (ref 4.0–10.5)
nRBC: 0 % (ref 0.0–0.2)

## 2019-12-21 LAB — BASIC METABOLIC PANEL
Anion gap: 8 (ref 5–15)
BUN: 7 mg/dL (ref 6–20)
CO2: 25 mmol/L (ref 22–32)
Calcium: 7.7 mg/dL — ABNORMAL LOW (ref 8.9–10.3)
Chloride: 106 mmol/L (ref 98–111)
Creatinine, Ser: 0.72 mg/dL (ref 0.61–1.24)
GFR calc Af Amer: >60 mL/min (ref >60)
GFR calc non Af Amer: >60 mL/min (ref >60)
Glucose, Bld: 93 mg/dL (ref 70–99)
Potassium: 3.3 mmol/L — ABNORMAL LOW (ref 3.5–5.1)
Sodium: 139 mmol/L (ref 135–145)

## 2019-12-21 LAB — GLUCOSE, CAPILLARY
Glucose-Capillary: 98 mg/dL (ref 70–99)
Glucose-Capillary: 95 mg/dL (ref 70–99)
Glucose-Capillary: 114 mg/dL — ABNORMAL HIGH (ref 70–99)
Glucose-Capillary: 86 mg/dL (ref 70–99)
Glucose-Capillary: 104 mg/dL — ABNORMAL HIGH (ref 70–99)
Glucose-Capillary: 107 mg/dL — ABNORMAL HIGH (ref 70–99)

## 2019-12-21 MED ORDER — PANTOPRAZOLE SODIUM 40 MG PO TBEC
40.0000 mg | DELAYED_RELEASE_TABLET | Freq: Every day | ORAL | Status: DC
  Administered 2019-12-21 – 2020-01-06 (×17): 40 mg via ORAL
  Filled 2019-12-21 (×17): qty 1

## 2019-12-21 MED ORDER — LEVALBUTEROL HCL 1.25 MG/0.5ML IN NEBU
1.2500 mg | INHALATION_SOLUTION | Freq: Two times a day (BID) | RESPIRATORY_TRACT | Status: DC
  Administered 2019-12-21 – 2019-12-23 (×5): 1.25 mg via RESPIRATORY_TRACT
  Filled 2019-12-21 (×5): qty 0.5

## 2019-12-21 MED ORDER — POTASSIUM CHLORIDE CRYS ER 20 MEQ PO TBCR
20.0000 meq | EXTENDED_RELEASE_TABLET | ORAL | Status: AC
  Administered 2019-12-21 (×3): 20 meq via ORAL
  Filled 2019-12-21 (×3): qty 1

## 2019-12-21 MED ORDER — ENOXAPARIN SODIUM 40 MG/0.4ML ~~LOC~~ SOLN
40.0000 mg | SUBCUTANEOUS | Status: DC
  Administered 2019-12-21 – 2020-01-06 (×17): 40 mg via SUBCUTANEOUS
  Filled 2019-12-21 (×18): qty 0.4

## 2019-12-21 NOTE — Progress Notes (Signed)
3 Days Post-Op Procedure(s) (LRB): VIDEO ASSISTED THORACOSCOPY (VATS)/EMPYEMA (Right) Subjective: Increased sub pulmonic space on the R Chest tube suction increased Pleural peel cultures remain neg- WBC normal  Objective: Vital signs in last 24 hours: Temp:  [97.9 F (36.6 C)-98.7 F (37.1 C)] 98.6 F (37 C) (02/07 0803) Pulse Rate:  [92] 92 (02/07 0835) Cardiac Rhythm: Normal sinus rhythm (02/07 0400) Resp:  [19-38] 30 (02/07 0900) BP: (111-140)/(70-122) 134/87 (02/07 0900) SpO2:  [3 %-99 %] 99 % (02/07 0900) Weight:  [94.2 kg] 94.2 kg (02/07 0600)  Hemodynamic parameters for last 24 hours:  nsr  Intake/Output from previous day: 02/06 0701 - 02/07 0700 In: 3100.2 [P.O.:840; I.V.:2060.3; IV Piggyback:199.9] Out: 3135 [Urine:2775; Chest Tube:360] Intake/Output this shift: Total I/O In: -  Out: 350 [Urine:250; Chest Tube:100]  EXAM Sl R subQ air coarse BS on R  Lab Results: Recent Labs    12/20/19 0410 12/21/19 0527  WBC 10.9* 8.3  HGB 8.6* 8.4*  HCT 27.7* 26.6*  PLT 334 346   BMET:  Recent Labs    12/20/19 0410 12/21/19 0527  NA 138 139  K 3.9 3.3*  CL 104 106  CO2 23 25  GLUCOSE 99 93  BUN 11 7  CREATININE 0.84 0.72  CALCIUM 7.8* 7.7*    PT/INR: No results for input(s): LABPROT, INR in the last 72 hours. ABG    Component Value Date/Time   PHART 7.442 12/19/2019 1049   HCO3 25.0 12/19/2019 1049   TCO2 26 12/19/2019 1049   ACIDBASEDEF 4.1 (H) 11/09/2019 2210   O2SAT 98.0 12/19/2019 1049   CBG (last 3)  Recent Labs    12/20/19 2334 12/21/19 0341 12/21/19 0808  GLUCAP 90 98 95    Assessment/Plan: S/P Procedure(s) (LRB): VIDEO ASSISTED THORACOSCOPY (VATS)/EMPYEMA (Right) Mobilize Diuresis Plan for transfer to step-down: see transfer orders   LOS: 40 days    Kathlee Nations Trigt III 12/21/2019

## 2019-12-22 ENCOUNTER — Inpatient Hospital Stay (HOSPITAL_COMMUNITY): Payer: Medicaid Other

## 2019-12-22 LAB — CBC
HCT: 26.6 % — ABNORMAL LOW (ref 39.0–52.0)
Hemoglobin: 8.2 g/dL — ABNORMAL LOW (ref 13.0–17.0)
MCH: 27.7 pg (ref 26.0–34.0)
MCHC: 30.8 g/dL (ref 30.0–36.0)
MCV: 89.9 fL (ref 80.0–100.0)
Platelets: 388 10*3/uL (ref 150–400)
RBC: 2.96 MIL/uL — ABNORMAL LOW (ref 4.22–5.81)
RDW: 16.5 % — ABNORMAL HIGH (ref 11.5–15.5)
WBC: 8 10*3/uL (ref 4.0–10.5)
nRBC: 0 % (ref 0.0–0.2)

## 2019-12-22 LAB — BASIC METABOLIC PANEL
Anion gap: 8 (ref 5–15)
BUN: 5 mg/dL — ABNORMAL LOW (ref 6–20)
CO2: 23 mmol/L (ref 22–32)
Calcium: 7.8 mg/dL — ABNORMAL LOW (ref 8.9–10.3)
Chloride: 109 mmol/L (ref 98–111)
Creatinine, Ser: 0.72 mg/dL (ref 0.61–1.24)
GFR calc Af Amer: >60 mL/min (ref >60)
GFR calc non Af Amer: >60 mL/min (ref >60)
Glucose, Bld: 105 mg/dL — ABNORMAL HIGH (ref 70–99)
Potassium: 3.6 mmol/L (ref 3.5–5.1)
Sodium: 140 mmol/L (ref 135–145)

## 2019-12-22 LAB — GLUCOSE, CAPILLARY
Glucose-Capillary: 102 mg/dL — ABNORMAL HIGH (ref 70–99)
Glucose-Capillary: 109 mg/dL — ABNORMAL HIGH (ref 70–99)
Glucose-Capillary: 132 mg/dL — ABNORMAL HIGH (ref 70–99)
Glucose-Capillary: 84 mg/dL (ref 70–99)
Glucose-Capillary: 87 mg/dL (ref 70–99)
Glucose-Capillary: 95 mg/dL (ref 70–99)

## 2019-12-22 MED ORDER — INSULIN ASPART 100 UNIT/ML ~~LOC~~ SOLN
0.0000 [IU] | Freq: Three times a day (TID) | SUBCUTANEOUS | Status: DC
  Administered 2019-12-22 – 2020-01-05 (×17): 2 [IU] via SUBCUTANEOUS
  Administered 2020-01-05: 3 [IU] via SUBCUTANEOUS
  Administered 2020-01-05: 12:00:00 4 [IU] via SUBCUTANEOUS
  Administered 2020-01-06 (×2): 2 [IU] via SUBCUTANEOUS

## 2019-12-22 MED ORDER — INSULIN ASPART 100 UNIT/ML ~~LOC~~ SOLN: 0.0000 [IU] | Freq: Three times a day (TID) | SUBCUTANEOUS | Status: DC

## 2019-12-22 NOTE — Progress Notes (Signed)
[address]       [place] [address]             [phone]      4 Days Post-Op Procedure(s) (LRB): VIDEO ASSISTED THORACOSCOPY (VATS)/EMPYEMA (Right) Subjective: conts to slowly feel better  Objective: Vital signs in last 24 hours: Temp:  [97.4 F (36.3 C)-98.7 F (37.1 C)] 98.6 F (37 C) (02/08 0730) Pulse Rate:  [85-92] 85 (02/08 0344) Cardiac Rhythm: Normal sinus rhythm (02/08 0421) Resp:  [18-32] 22 (02/08 0730) BP: (126-139)/(72-95) 135/72 (02/08 0730) SpO2:  [3 %-100 %] 100 % (02/08 0344)  Hemodynamic parameters for last 24 hours:    Intake/Output from previous day: 02/07 0701 - 02/08 0700 In: 1625.2 [P.O.:720; I.V.:740.1; IV Piggyback:165] Out: 2364 [Urine:1950; Chest Tube:414] Intake/Output this shift: No intake/output data recorded.  General appearance: alert, cooperative and no distress Heart: regular rate and rhythm Lungs: coarse BS R>L, with scattered ronchi and some wheeze Abdomen: soft, non-tender; bowel sounds normal; no masses,  no organomegaly Wound: incis healing well  Lab Results: Recent Labs    12/21/19 0527 12/22/19 0444  WBC 8.3 8.0  HGB 8.4* 8.2*  HCT 26.6* 26.6*  PLT 346 388   BMET:  Recent Labs    12/21/19 0527 12/22/19 0444  NA 139 140  K 3.3* 3.6  CL 106 109  CO2 25 23  GLUCOSE 93 105*  BUN 7 5*  CREATININE 0.72 0.72  CALCIUM 7.7* 7.8*    PT/INR: No results for input(s): LABPROT, INR in the last 72 hours. ABG    Component Value Date/Time   PHART 7.442 12/19/2019 1049   HCO3 25.0 12/19/2019 1049   TCO2 26 12/19/2019 1049   ACIDBASEDEF 4.1 (H) 11/09/2019 2210   O2SAT 98.0 12/19/2019 1049   CBG (last 3)  Recent Labs    12/21/19 2027 12/21/19 2310 12/22/19 0343  GLUCAP 104* 107* 95    Meds Scheduled Meds: . acetaminophen  1,000 mg Oral Q6H   Or  . acetaminophen (TYLENOL) oral liquid 160 mg/5 mL  1,000 mg Oral Q6H  . atorvastatin  10 mg Oral QPM  . bisacodyl  10 mg Oral Daily  .  Chlorhexidine Gluconate Cloth  6 each Topical Daily  . enoxaparin (LOVENOX) injection  40 mg Subcutaneous Q24H  . fentaNYL   Intravenous Q4H  . guaiFENesin  600 mg Oral BID  . insulin aspart  0-24 Units Subcutaneous Q4H  . levalbuterol  1.25 mg Inhalation BID  . mouth rinse  15 mL Mouth Rinse BID  . pantoprazole  40 mg Oral Q1200  . senna-docusate  1 tablet Oral QHS  . sodium chloride flush  10-40 mL Intracatheter Q12H   Continuous Infusions: . sodium chloride    . sodium chloride 100 mL/hr at 12/21/19 1806  .  ceFAZolin (ANCEF) IV 2 g (12/21/19 2245)   PRN Meds:.Place/Maintain arterial line **AND** sodium chloride, diphenhydrAMINE **OR** diphenhydrAMINE, influenza vac split quadrivalent PF, naloxone **AND** sodium chloride flush, ondansetron (ZOFRAN) IV, oxyCODONE, sodium chloride flush, traMADol  Xrays DG Chest 2 View  Result Date: 12/21/2019 CLINICAL DATA:  S/p RIGHT VATS for empyema. EXAM: CHEST - 2 VIEW COMPARISON:  12/20/2019 and prior studies FINDINGS: Two RIGHT thoracostomy tubes are now noted. The LOWER RIGHT thoracostomy tube side hole lies in the subcutaneous tissues. Increasing subcutaneous emphysema is noted. A small to moderate RIGHT lateral/basilar pneumothorax is now identified. RIGHT pleural thickening/fluid and bilateral airspace opacities are again noted. IMPRESSION: 1.  Increasing small to moderate RIGHT lateral/basilar pneumothorax and increasing RIGHT subcutaneous emphysema. The LOWER RIGHT thoracostomy tube side hole lies in the subcutaneous tissues external to the ribs. 2. Bilateral airspace opacities Critical Value/emergent results were called by telephone at the time of interpretation on 12/21/2019 at 8:11 am to provider Joycie Peek, nurse for this patient, who verbally acknowledged these results. Electronically Signed   By: Harmon Pier M.D.   On: 12/21/2019 08:12   DG Chest Port 1 View  Result Date: 12/22/2019 CLINICAL DATA:  54 year old male postoperative day 4  right side VATS, drainage of empyema/organized hemothorax and decortication of lower lobe. EXAM: PORTABLE CHEST 1 VIEW COMPARISON:  Chest radiographs 12/21/2019 and earlier. FINDINGS: Portable AP semi upright view at 0522 hours. 3 right chest tubes remain in place although as noted yesterday 1 tube has been pulled back such that the side hole is in the chest wall. Associated increasing right chest wall and neck subcutaneous gas. Decreased right pneumothorax since yesterday, suspected to be trace now. Stable lung volumes and mediastinal contours. Patchy residual right lung opacity is stable. Visualized tracheal air column is within normal limits. Stable left lung ventilation. IMPRESSION: 1. Right chest wall and neck subcutaneous emphysema likely from one of the chest tubes which has been pulled back with its side hole in the chest wall. 2. Two other chest tubes remain stable with decreased right pneumothorax, now trace. 3. Stable postoperative opacity in the right lung. Electronically Signed   By: Odessa Fleming M.D.   On: 12/22/2019 06:08   Results for orders placed or performed during the hospital encounter of 11/11/19  Culture, blood (routine x 2)     Status: Abnormal   Collection Time: 11/23/19  2:10 PM   Specimen: BLOOD  Result Value Ref Range Status   Specimen Description   Final    BLOOD LEFT ARM Performed at [facility], [address], [place], [place] [address]    Special Requests   Final    BOTTLES DRAWN AEROBIC ONLY Blood Culture adequate volume Performed at [facility], [address], [place], [place] [address]    Culture  Setup Time   Final    AEROBIC BOTTLE ONLY GRAM POSITIVE COCCI IN CLUSTERS CRITICAL RESULT CALLED TO, READ BACK BY AND VERIFIED WITH: E. WILLIAMSON, PHARMD (GVC) AT 1255 ON 11/24/19 BY C. JESSUP, MT.    Culture (A)  Final    STAPHYLOCOCCUS AUREUS SUSCEPTIBILITIES PERFORMED ON PREVIOUS CULTURE WITHIN THE LAST 5 DAYS. Performed at  [facility], [address] [place], [place] [address]    Report Status 11/26/2019 FINAL  Final  Culture, blood (routine x 2)     Status: Abnormal   Collection Time: 11/23/19  2:15 PM   Specimen: BLOOD  Result Value Ref Range Status   Specimen Description   Final    BLOOD LEFT HAND Performed at [facility], [address], [place], [place] [address]    Special Requests   Final    BOTTLES DRAWN AEROBIC ONLY Blood Culture adequate volume Performed at [facility], [address], [place], [place] [address]    Culture  Setup Time   Final    GRAM POSITIVE COCCI IN CLUSTERS AEROBIC BOTTLE ONLY CRITICAL RESULT CALLED TO, READ BACK BY AND VERIFIED WITH: K AMEND [facility] 2202 11/24/19 A BROWNING Performed at [facility], [address], [place], [place] [address]    Culture STAPHYLOCOCCUS AUREUS (A)  Final   Report Status 11/26/2019 FINAL  Final   Organism ID, Bacteria STAPHYLOCOCCUS AUREUS  Final      Susceptibility   Staphylococcus aureus - MIC*    CIPROFLOXACIN <=0.5 SENSITIVE Sensitive     ERYTHROMYCIN >=8 RESISTANT Resistant     GENTAMICIN <=0.5 SENSITIVE Sensitive     OXACILLIN <=0.25 SENSITIVE Sensitive     TETRACYCLINE <=1 SENSITIVE Sensitive     VANCOMYCIN 1 SENSITIVE Sensitive     TRIMETH/SULFA <=10 SENSITIVE Sensitive     CLINDAMYCIN RESISTANT Resistant     RIFAMPIN <=0.5 SENSITIVE Sensitive     Inducible Clindamycin POSITIVE Resistant     * STAPHYLOCOCCUS AUREUS  Blood Culture ID Panel (Reflexed)     Status: Abnormal   Collection Time: 11/23/19  2:15 PM  Result Value Ref Range Status   Enterococcus species NOT DETECTED NOT DETECTED Final   Listeria monocytogenes NOT DETECTED NOT DETECTED Final   Staphylococcus species DETECTED (A) NOT DETECTED Final    Comment: CRITICAL RESULT CALLED TO, READ BACK BY AND VERIFIED WITH: K AMEND PHARMD 2202 11/24/19 A BROWNING    Staphylococcus aureus (BCID) DETECTED (A) NOT DETECTED  Final    Comment: Methicillin (oxacillin) susceptible Staphylococcus aureus (MSSA). Preferred therapy is anti staphylococcal beta lactam antibiotic (Cefazolin or Nafcillin), unless clinically contraindicated. CRITICAL RESULT CALLED TO, READ BACK BY AND VERIFIED WITH: K AMEND PHARMD 2202 11/24/19 A BROWNING    Methicillin resistance NOT DETECTED NOT DETECTED Final   Streptococcus species NOT DETECTED NOT DETECTED Final   Streptococcus agalactiae NOT DETECTED NOT DETECTED Final   Streptococcus pneumoniae NOT DETECTED NOT DETECTED Final   Streptococcus pyogenes NOT DETECTED NOT DETECTED Final   Acinetobacter baumannii NOT DETECTED NOT DETECTED Final   Enterobacteriaceae species NOT DETECTED NOT DETECTED Final   Enterobacter cloacae complex NOT DETECTED NOT DETECTED Final   Escherichia coli NOT DETECTED NOT DETECTED Final   Klebsiella oxytoca NOT DETECTED NOT DETECTED Final   Klebsiella pneumoniae NOT DETECTED NOT DETECTED Final   Proteus species NOT DETECTED NOT DETECTED Final   Serratia marcescens NOT DETECTED NOT DETECTED Final   Haemophilus influenzae NOT DETECTED NOT DETECTED Final   Neisseria meningitidis NOT DETECTED NOT DETECTED Final   Pseudomonas aeruginosa NOT DETECTED NOT DETECTED Final   Candida albicans NOT DETECTED NOT DETECTED Final   Candida glabrata NOT DETECTED NOT DETECTED Final   Candida krusei NOT DETECTED NOT DETECTED Final   Candida parapsilosis NOT DETECTED NOT DETECTED Final   Candida tropicalis NOT DETECTED NOT DETECTED Final    Comment: Performed at [facility], [address] [place], [place] [address]  Body fluid culture     Status: None   Collection Time: 11/25/19 11:33 AM   Specimen: Pleura; Body Fluid  Result Value Ref Range Status   Specimen Description   Final    PLEURAL RT Performed at [facility] [address] [place], [place] [address]    Special Requests   Final    NONE Performed at [facility],  [place] [address], [place], [place] [address]    Gram Stain   Final    MODERATE WBC PRESENT, PREDOMINANTLY PMN RARE GRAM POSITIVE COCCI IN PAIRS Performed at [facility], [place] [address] [place], [place] [address]    Culture MODERATE STAPHYLOCOCCUS AUREUS  Final   Report Status 11/28/2019 FINAL  Final   Organism ID, Bacteria STAPHYLOCOCCUS AUREUS  Final      Susceptibility   Staphylococcus  aureus - MIC*    CIPROFLOXACIN <=0.5 SENSITIVE Sensitive     ERYTHROMYCIN >=8 RESISTANT Resistant     GENTAMICIN <=0.5 SENSITIVE Sensitive     OXACILLIN <=0.25 SENSITIVE Sensitive     TETRACYCLINE <=1 SENSITIVE Sensitive     VANCOMYCIN 1 SENSITIVE Sensitive     TRIMETH/SULFA <=10 SENSITIVE Sensitive     CLINDAMYCIN RESISTANT Resistant     RIFAMPIN <=0.5 SENSITIVE Sensitive     Inducible Clindamycin POSITIVE Resistant     * MODERATE STAPHYLOCOCCUS AUREUS  Culture, blood (routine x 2)     Status: None   Collection Time: 11/25/19  7:05 PM   Specimen: BLOOD  Result Value Ref Range Status   Specimen Description   Final    BLOOD Performed at [facility], [address], [place], [place] [address]    Special Requests   Final    Normal Performed at [facility], [address] [place], [place] [address]    Culture   Final    NO GROWTH 5 DAYS Performed at [facility], [address], [place], [place] [address]    Report Status 12/01/2019 FINAL  Final  MRSA PCR Screening     Status: None   Collection Time: 12/05/19  9:17 AM   Specimen: Nasal Mucosa; Nasopharyngeal  Result Value Ref Range Status   MRSA by PCR NEGATIVE NEGATIVE Final    Comment:        The GeneXpert MRSA Assay (FDA approved for NASAL specimens only), is one component of a comprehensive MRSA colonization surveillance program. It is not intended to diagnose MRSA infection nor to guide or monitor treatment for MRSA infections. Performed at [facility], [address], [place], [place] [address]   Culture, fungus without smear     Status: None (Preliminary result)   Collection Time: 12/18/19  9:09 AM   Specimen: Pleural, Right; Body Fluid  Result Value Ref Range Status   Specimen Description PLEURAL RIGHT  Final   Special Requests PATIENT ON FOLLOWING ANCEF  Final   Culture   Final    NO FUNGUS ISOLATED AFTER 2 DAYS Performed at [facility], [address] [place], [place] [address]    Report Status PENDING  Incomplete  Aerobic/Anaerobic Culture (surgical/deep wound)     Status: None (Preliminary result)   Collection Time: 12/18/19  9:09 AM   Specimen: Pleural, Right; Body Fluid  Result Value Ref Range Status   Specimen Description PLEURAL RIGHT  Final   Special Requests PATIENT ON FOLLOWING ANCEF  Final   Gram Stain   Final    ABUNDANT WBC PRESENT, PREDOMINANTLY PMN NO ORGANISMS SEEN    Culture   Final    NO GROWTH 3 DAYS NO ANAEROBES ISOLATED; CULTURE IN PROGRESS FOR 5 DAYS Performed at [facility], [address] [place], [place] [address]    Report Status PENDING  Incomplete  Acid Fast Smear (AFB)     Status: None   Collection Time: 12/18/19  9:09 AM   Specimen: Pleural, Right; Body Fluid  Result Value Ref Range Status   AFB Specimen Processing Concentration  Final   Acid Fast Smear Negative  Final    Comment: (NOTE) Performed At: [facility] [address] [place], [place] [id number] Jolene Schimke MD JJ:[id number]    Source (AFB) PLEURAL  Final    Comment: RIGHT Performed at [facility], [address] [place], [place]  [address]   Aerobic/Anaerobic Culture (surgical/deep wound)     Status: None (Preliminary result)   Collection Time: 12/18/19  9:54 AM   Specimen: Soft Tissue, Other  Result Value Ref Range Status   Specimen Description TISSUE RIGHT PLEURAL PEEL  Final   Special Requests PATIENT ON FOLLOWING ANCEF  Final   Gram Stain   Final    ABUNDANT WBC PRESENT, PREDOMINANTLY PMN NO  ORGANISMS SEEN    Culture   Final    NO GROWTH 3 DAYS NO ANAEROBES ISOLATED; CULTURE IN PROGRESS FOR 5 DAYS Performed at [facility], [address] [place], [place] [address]    Report Status PENDING  Incomplete  Acid Fast Smear (AFB)     Status: None   Collection Time: 12/18/19  9:54 AM   Specimen: Soft Tissue, Other  Result Value Ref Range Status   AFB Specimen Processing Concentration  Final   Acid Fast Smear Negative  Final    Comment: (NOTE) Performed At: [facility] [address] [place], [place] [id number] Jolene Schimke MD GB:[id number]    Source (AFB) RIGHT  Final    Comment: PLEURAL PEEL Performed at [facility], [address], [place], [place] [address]    Assessment/Plan: S/P Procedure(s) (LRB): VIDEO ASSISTED THORACOSCOPY (VATS)/EMPYEMA (Right)   1 hemodyn stable in sinus  2 sats ok on 3 liters 3 CXR shows increased SQ air with one tube in sub q tissue, no PNTX 4 CXR + air leak 5 renal fxn stable  6 H/H stable 7 no leukocytosis or fevers 8 BS well controlled- prob restart metformin soon 9 poss start removal of CT's soon  10 routine pulm toilet/rehab as able 11 Cx - staph, pan sensitive- cont ancef  LOS: 41 days    Rowe Clack [facility] 12/22/2019 Pager [phone]

## 2019-12-22 NOTE — Progress Notes (Signed)
Physical Therapy Treatment Patient Details Name: Nathaniel Webb MRN: [id number] DOB: 03-Aug-1966 Today's Date: 12/22/2019    History of Present Illness 53 y/o male w/ hx of Obesity, low HDL, HTN, DM, presented to [facility] (12/29) with approximately 1 week history of gradually worsening shortness of breath.  He was found to have acute hypoxic respiratory failure and was requiring high flow oxygen to maintain O2 saturations.  Chest x-ray was typical of Covid pneumonitis. Pt dx w/ COVID 12/23 and dc home w/ spouse. 1/10 CXR showed ARDS bilateral increasing opacification; respiratory status worsened; concerns for aspiration. Acute PE; loculated right pleural effusion; 1/11 CTS consult, recs chest tube. 1/12 CT chest with densely consolidated right lung, air fluid level/ hydropneumothorax. Thoracentesis 1/12; pleural fluid sampled and concerning for empyema.  1/13 emergently transferred to ICU for worsening hypoxia; CT showed worsening right hydropneumothorax with loculations in pleura. Emergent right chest tube placed. Pt intubated 11/30/19-12/01/19. Pt developed hematuria on 1/21. Pt underwent VATS on 2/4, intubated post-proceudre and extubated on 2/5.    PT Comments    Patient seen for mobility progression. Pt is very motivated to participate in therapy. Pt tolerated increased gait distance this session. Continue to recommend CIR for further skilled PT services to maximize independence and safety with mobility.      Follow Up Recommendations  CIR     Equipment Recommendations  Rolling walker with 5" wheels;3in1 (PT)    Recommendations for Other Services       Precautions / Restrictions Precautions Precautions: Fall Precaution Comments: Rt sided Chest tubes, monitor SpO2 and RR    Mobility  Bed Mobility               General bed mobility comments: pt OOB in chair upon arrival  Transfers Overall transfer level: Needs assistance Equipment used: Rolling walker (2 wheeled) Transfers: Sit  to/from Stand Sit to Stand: Min guard         General transfer comment: cues for safe hand placement; min guard for safety to stand from recliner X 2 trials  Ambulation/Gait Ambulation/Gait assistance: Min guard;+2 safety/equipment(chair follow ) Gait Distance (Feet): (70 ft total with seated break ) Assistive device: Rolling walker (2 wheeled) Gait Pattern/deviations: Step-through pattern Gait velocity: reduced   General Gait Details: slow, steady gait; seated break due to increased RR, DOE, and fatigue   Stairs             Wheelchair Mobility    Modified Rankin (Stroke Patients Only)       Balance Overall balance assessment: Needs assistance Sitting-balance support: No upper extremity supported;Feet supported Sitting balance-Leahy Scale: Good     Standing balance support: Bilateral upper extremity supported Standing balance-Leahy Scale: Fair                              Cognition Arousal/Alertness: Awake/alert Behavior During Therapy: WFL for tasks assessed/performed Overall Cognitive Status: Within Functional Limits for tasks assessed                                        Exercises      General Comments General comments (skin integrity, edema, etc.): SpO2 >90% on 4L O2 via [place], HR into 120s, and RR into 40s with ambulation      Pertinent Vitals/Pain Pain Assessment: Faces Faces Pain Scale: Hurts little more Pain Location: chest  and thorax during coughing/sneezing  Pain Descriptors / Indicators: Sore;Grimacing;Guarding Pain Intervention(s): Monitored during session;Other (comment)(holding onto pillow)    Home Living                      Prior Function            PT Goals (current goals can now be found in the care plan section) Progress towards PT goals: Progressing toward goals    Frequency    Min 3X/week      PT Plan Current plan remains appropriate    Co-evaluation              AM-PAC  PT "6 Clicks" Mobility   Outcome Measure  Help needed turning from your back to your side while in a flat bed without using bedrails?: A Little Help needed moving from lying on your back to sitting on the side of a flat bed without using bedrails?: A Little Help needed moving to and from a bed to a chair (including a wheelchair)?: A Little Help needed standing up from a chair using your arms (e.g., wheelchair or bedside chair)?: A Little Help needed to walk in hospital room?: A Little Help needed climbing 3-5 steps with a railing? : A Lot 6 Click Score: 17    End of Session Equipment Utilized During Treatment: Oxygen Activity Tolerance: Patient tolerated treatment well Patient left: in chair;with call bell/phone within reach;with family/visitor present Nurse Communication: Mobility status PT Visit Diagnosis: Unsteadiness on feet (R26.81);Muscle weakness (generalized) (M62.81)     Time: 6803-2122 PT Time Calculation (min) (ACUTE ONLY): 24 min  Charges:  $Gait Training: 23-37 mins                     Erline Levine, PTA Acute Rehabilitation Services Pager: [phone] Office: [phone]     Carolynne Edouard 12/22/2019, 2:55 PM

## 2019-12-22 NOTE — Plan of Care (Signed)

## 2019-12-23 ENCOUNTER — Inpatient Hospital Stay (HOSPITAL_COMMUNITY): Payer: Medicaid Other

## 2019-12-23 LAB — BASIC METABOLIC PANEL
Anion gap: 8 (ref 5–15)
BUN: 7 mg/dL (ref 6–20)
CO2: 25 mmol/L (ref 22–32)
Calcium: 8 mg/dL — ABNORMAL LOW (ref 8.9–10.3)
Chloride: 107 mmol/L (ref 98–111)
Creatinine, Ser: 0.69 mg/dL (ref 0.61–1.24)
GFR calc Af Amer: >60 mL/min (ref >60)
GFR calc non Af Amer: >60 mL/min (ref >60)
Glucose, Bld: 108 mg/dL — ABNORMAL HIGH (ref 70–99)
Potassium: 3.4 mmol/L — ABNORMAL LOW (ref 3.5–5.1)
Sodium: 140 mmol/L (ref 135–145)

## 2019-12-23 LAB — AEROBIC/ANAEROBIC CULTURE W GRAM STAIN (SURGICAL/DEEP WOUND)
Culture: NO GROWTH
Culture: NO GROWTH

## 2019-12-23 LAB — CBC
HCT: 26.8 % — ABNORMAL LOW (ref 39.0–52.0)
Hemoglobin: 8.2 g/dL — ABNORMAL LOW (ref 13.0–17.0)
MCH: 27.5 pg (ref 26.0–34.0)
MCHC: 30.6 g/dL (ref 30.0–36.0)
MCV: 89.9 fL (ref 80.0–100.0)
Platelets: 446 10*3/uL — ABNORMAL HIGH (ref 150–400)
RBC: 2.98 MIL/uL — ABNORMAL LOW (ref 4.22–5.81)
RDW: 16.4 % — ABNORMAL HIGH (ref 11.5–15.5)
WBC: 8.5 10*3/uL (ref 4.0–10.5)
nRBC: 0 % (ref 0.0–0.2)

## 2019-12-23 LAB — GLUCOSE, CAPILLARY
Glucose-Capillary: 94 mg/dL (ref 70–99)
Glucose-Capillary: 94 mg/dL (ref 70–99)
Glucose-Capillary: 122 mg/dL — ABNORMAL HIGH (ref 70–99)
Glucose-Capillary: 100 mg/dL — ABNORMAL HIGH (ref 70–99)
Glucose-Capillary: 125 mg/dL — ABNORMAL HIGH (ref 70–99)
Glucose-Capillary: 115 mg/dL — ABNORMAL HIGH (ref 70–99)

## 2019-12-23 MED ORDER — ENSURE ENLIVE PO LIQD
237.0000 mL | Freq: Three times a day (TID) | ORAL | Status: DC
  Administered 2019-12-23 – 2020-01-06 (×44): 237 mL via ORAL

## 2019-12-23 MED ORDER — POTASSIUM CHLORIDE CRYS ER 20 MEQ PO TBCR
40.0000 meq | EXTENDED_RELEASE_TABLET | Freq: Two times a day (BID) | ORAL | Status: AC
  Administered 2019-12-23 (×2): 40 meq via ORAL
  Filled 2019-12-23 (×2): qty 2

## 2019-12-23 MED ORDER — ADULT MULTIVITAMIN W/MINERALS CH
1.0000 | ORAL_TABLET | Freq: Every day | ORAL | Status: DC
  Administered 2019-12-23 – 2020-01-07 (×16): 1 via ORAL
  Filled 2019-12-23 (×15): qty 1

## 2019-12-23 NOTE — Progress Notes (Addendum)
[address]       [place] [address]             [phone]      5 Days Post-Op Procedure(s) (LRB): VIDEO ASSISTED THORACOSCOPY (VATS)/EMPYEMA (Right) Subjective: conts to slowly feel better  Objective: Vital signs in last 24 hours: Temp:  [97.6 F (36.4 C)-98.3 F (36.8 C)] 98.3 F (36.8 C) (02/09 0326) Pulse Rate:  [81-88] 85 (02/09 0326) Cardiac Rhythm: Normal sinus rhythm (02/08 1920) Resp:  [19-28] 23 (02/09 0702) BP: (130-137)/(87-93) 130/87 (02/09 0326) SpO2:  [97 %-100 %] 98 % (02/09 0702) FiO2 (%):  [30 %] 30 % (02/09 0702)  Hemodynamic parameters for last 24 hours:    Intake/Output from previous day: 02/08 0701 - 02/09 0700 In: -  Out: 1640 [Urine:1290; Chest Tube:350] Intake/Output this shift: No intake/output data recorded.  General appearance: alert, cooperative and no distress Heart: regular rate and rhythm Lungs: ronchi throughout right lung Abdomen: benign Extremities: no edema or calf tenderness Wound: incis healing well  Lab Results: Recent Labs    12/22/19 0444 12/23/19 0343  WBC 8.0 8.5  HGB 8.2* 8.2*  HCT 26.6* 26.8*  PLT 388 446*   BMET:  Recent Labs    12/22/19 0444 12/23/19 0343  NA 140 140  K 3.6 3.4*  CL 109 107  CO2 23 25  GLUCOSE 105* 108*  BUN 5* 7  CREATININE 0.72 0.69  CALCIUM 7.8* 8.0*    PT/INR: No results for input(s): LABPROT, INR in the last 72 hours. ABG    Component Value Date/Time   PHART 7.442 12/19/2019 1049   HCO3 25.0 12/19/2019 1049   TCO2 26 12/19/2019 1049   ACIDBASEDEF 4.1 (H) 11/09/2019 2210   O2SAT 98.0 12/19/2019 1049   CBG (last 3)  Recent Labs    12/22/19 2355 12/23/19 0439 12/23/19 0701  GLUCAP 87 94 94    Meds Scheduled Meds: . acetaminophen  1,000 mg Oral Q6H   Or  . acetaminophen (TYLENOL) oral liquid 160 mg/5 mL  1,000 mg Oral Q6H  . atorvastatin  10 mg Oral QPM  . bisacodyl  10 mg Oral Daily  . Chlorhexidine Gluconate Cloth  6 each Topical  Daily  . enoxaparin (LOVENOX) injection  40 mg Subcutaneous Q24H  . fentaNYL   Intravenous Q4H  . guaiFENesin  600 mg Oral BID  . insulin aspart  0-24 Units Subcutaneous TID AC & HS  . levalbuterol  1.25 mg Inhalation BID  . mouth rinse  15 mL Mouth Rinse BID  . pantoprazole  40 mg Oral Q1200  . senna-docusate  1 tablet Oral QHS  . sodium chloride flush  10-40 mL Intracatheter Q12H   Continuous Infusions: . sodium chloride    . sodium chloride 100 mL/hr at 12/21/19 1806  .  ceFAZolin (ANCEF) IV 2 g (12/23/19 0022)   PRN Meds:.Place/Maintain arterial line **AND** sodium chloride, diphenhydrAMINE **OR** diphenhydrAMINE, influenza vac split quadrivalent PF, naloxone **AND** sodium chloride flush, ondansetron (ZOFRAN) IV, oxyCODONE, sodium chloride flush, traMADol  Xrays DG Chest Port 1 View  Result Date: 12/22/2019 CLINICAL DATA:  54 year old male postoperative day 4 right side VATS, drainage of empyema/organized hemothorax and decortication of lower lobe. EXAM: PORTABLE CHEST 1 VIEW COMPARISON:  Chest radiographs 12/21/2019 and earlier. FINDINGS: Portable AP semi upright view at 0522 hours. 3 right chest tubes remain in place although as noted yesterday 1 tube has been pulled back such that the side hole is  in the chest wall. Associated increasing right chest wall and neck subcutaneous gas. Decreased right pneumothorax since yesterday, suspected to be trace now. Stable lung volumes and mediastinal contours. Patchy residual right lung opacity is stable. Visualized tracheal air column is within normal limits. Stable left lung ventilation. IMPRESSION: 1. Right chest wall and neck subcutaneous emphysema likely from one of the chest tubes which has been pulled back with its side hole in the chest wall. 2. Two other chest tubes remain stable with decreased right pneumothorax, now trace. 3. Stable postoperative opacity in the right lung. Electronically Signed   By: Odessa Fleming M.D.   On: 12/22/2019 06:08     Assessment/Plan: S/P Procedure(s) (LRB): VIDEO ASSISTED THORACOSCOPY (VATS)/EMPYEMA (Right)  1 conts with steady progress 2 CXR stable in appearance 3 d/c post tube today, CT conts with large air leak 4 WBC stable, no fevers, conts ancef 5 renal fxn is stable 6 replace K+ 7 H/H stable- monitor 8 cont pulm toiley /Rehab   LOS: 42 days    Rowe Clack PA-C 12/23/2019 Pager [phone]  Small space at R costophrenic angle Better aeration of R lung DC posterior tube today\ Cont suction 20 cm on chest tubes  patient examined and medical record reviewed,agree with above note. Kathlee Nations Trigt III 12/23/2019   DC posterior chest tube

## 2019-12-23 NOTE — Progress Notes (Signed)
Inpatient Rehabilitation-Admissions Coordinator   Spoke with pt and his wife at the bedside. The patient is in favor of returning straight home once medically ready. Noted pt is progressing daily with therapies and by the time he is medically ready, home with home health may be reasonable. AC will continue to follow to ensure pt and his wife's preference is home.   Cheri Rous, OTR/L  Rehab Admissions Coordinator  [phone] 12/23/2019 2:30 PM

## 2019-12-23 NOTE — Progress Notes (Signed)
Nutrition Follow up  DOCUMENTATION CODES:   Obesity unspecified  INTERVENTION:    Add Ensure Enlive po TID, each supplement provides 350 kcal and 20 grams of protein  Add Magic cup TID with meals, each supplement provides 290 kcal and 9 grams of protein  MVI daily   NUTRITION DIAGNOSIS:   Inadequate oral intake related to acute illness, catabolic illness, decreased appetite as evidenced by NPO status, meal completion < 50%.  Ongoing  GOAL:   Patient will meet greater than or equal to 90% of their needs   Progressing  MONITOR:   PO intake, Supplement acceptance, Labs, Weight trends, Diet advancement  REASON FOR ASSESSMENT:   Consult, Ventilator Enteral/tube feeding initiation and management  ASSESSMENT:   54 yo male admitted with COVID 19 pneumonia, developed worsening pneumothorax requiring chest tube, brief intubation.   PMH includes DM, HTN, morbid obesity  12/29 Admit 01/13 Worsening pneumothorax, chest tube placed 01/17 Intubated 01/18 Extubated 2/4 s/p VATS for drainage empyema, removal of right pleural space hematoma 2/5 extubated  Post tube removed today.   Diet advanced to regular consistency on 2/5. Pt with previous complaints of poor dentition. Will monitor consistency toleration. Pt reports appetite is great. Eager to eat lunch today. Meal completions charted as 90-100% for his last three meals. Wants to have Ensure sent again. RD discussed the importance of protein intake to aid in post-op healing.   Admission weight: 95.5 kg  Current weight: 94.2 kg   I/O: -9,073 ml since 1/26 UOP: 1,290 ml x 24 hrs  Chest tubes: 350 ml x 24 hrs   Medications: dulcolax, SS novolog, 40 mEq KCL BID, senokot Labs: K 3.4 (L) CBG 84-122  Diet Order:   Diet Order            Diet heart healthy/carb modified Room service appropriate? Yes; Fluid consistency: Thin  Diet effective now              EDUCATION NEEDS:   Not appropriate for education at this  time  Skin:  Skin Assessment: Skin Integrity Issues: Skin Integrity Issues:: Incisions Incisions: R chest  Last BM:  2/8  Height:   Ht Readings from Last 1 Encounters:  11/11/19 5\' 5"  (1.651 m)    Weight:   Wt Readings from Last 1 Encounters:  12/21/19 94.2 kg    Ideal Body Weight:  61.8 kg  BMI:  Body mass index is 34.56 kg/m.  Estimated Nutritional Needs:   Kcal:  2325-2780 kcals  Protein:  135-160 g  Fluid:  >/= 2.2 L  03-29-2006 RD, LDN Clinical Nutrition Pager # [phone]

## 2019-12-23 NOTE — Progress Notes (Signed)
Posterior  Chest tube removed and tolerated well. Continue to monitor.

## 2019-12-24 ENCOUNTER — Inpatient Hospital Stay (HOSPITAL_COMMUNITY): Payer: Medicaid Other

## 2019-12-24 LAB — BASIC METABOLIC PANEL
Anion gap: 7 (ref 5–15)
BUN: 8 mg/dL (ref 6–20)
CO2: 27 mmol/L (ref 22–32)
Calcium: 8.2 mg/dL — ABNORMAL LOW (ref 8.9–10.3)
Chloride: 107 mmol/L (ref 98–111)
Creatinine, Ser: 0.72 mg/dL (ref 0.61–1.24)
GFR calc Af Amer: >60 mL/min (ref >60)
GFR calc non Af Amer: >60 mL/min (ref >60)
Glucose, Bld: 142 mg/dL — ABNORMAL HIGH (ref 70–99)
Potassium: 3.9 mmol/L (ref 3.5–5.1)
Sodium: 141 mmol/L (ref 135–145)

## 2019-12-24 LAB — CBC
HCT: 28.9 % — ABNORMAL LOW (ref 39.0–52.0)
Hemoglobin: 8.9 g/dL — ABNORMAL LOW (ref 13.0–17.0)
MCH: 27.4 pg (ref 26.0–34.0)
MCHC: 30.8 g/dL (ref 30.0–36.0)
MCV: 88.9 fL (ref 80.0–100.0)
Platelets: 498 10*3/uL — ABNORMAL HIGH (ref 150–400)
RBC: 3.25 MIL/uL — ABNORMAL LOW (ref 4.22–5.81)
RDW: 16.4 % — ABNORMAL HIGH (ref 11.5–15.5)
WBC: 9 10*3/uL (ref 4.0–10.5)
nRBC: 0 % (ref 0.0–0.2)

## 2019-12-24 LAB — GLUCOSE, CAPILLARY
Glucose-Capillary: 98 mg/dL (ref 70–99)
Glucose-Capillary: 144 mg/dL — ABNORMAL HIGH (ref 70–99)
Glucose-Capillary: 129 mg/dL — ABNORMAL HIGH (ref 70–99)
Glucose-Capillary: 85 mg/dL (ref 70–99)

## 2019-12-24 MED ORDER — LEVALBUTEROL HCL 1.25 MG/0.5ML IN NEBU: 1.2500 mg | INHALATION_SOLUTION | Freq: Four times a day (QID) | RESPIRATORY_TRACT | Status: DC | PRN

## 2019-12-24 MED ORDER — METFORMIN HCL 500 MG PO TABS
500.0000 mg | ORAL_TABLET | Freq: Every day | ORAL | Status: DC
  Administered 2019-12-25 – 2020-01-07 (×14): 500 mg via ORAL
  Filled 2019-12-24 (×14): qty 1

## 2019-12-24 MED ORDER — LISINOPRIL 20 MG PO TABS
20.0000 mg | ORAL_TABLET | Freq: Every day | ORAL | Status: DC
  Administered 2019-12-24 – 2020-01-07 (×15): 20 mg via ORAL
  Filled 2019-12-24 (×16): qty 1

## 2019-12-24 NOTE — Progress Notes (Signed)
Physical Therapy Treatment Patient Details Name: Nathaniel Webb MRN: [id number] DOB: [birth year] Today's Date: 12/24/2019    History of Present Illness 54 y/o male w/ hx of Obesity, low HDL, HTN, DM, presented to [facility] (12/29) with approximately 1 week history of gradually worsening shortness of breath.  He was found to have acute hypoxic respiratory failure and was requiring high flow oxygen to maintain O2 saturations.  Chest x-ray was typical of Covid pneumonitis. Pt dx w/ COVID 12/23 and dc home w/ spouse. 1/10 CXR showed ARDS bilateral increasing opacification; respiratory status worsened; concerns for aspiration. Acute PE; loculated right pleural effusion; 1/11 CTS consult, recs chest tube. 1/12 CT chest with densely consolidated right lung, air fluid level/ hydropneumothorax. Thoracentesis 1/12; pleural fluid sampled and concerning for empyema.  1/13 emergently transferred to ICU for worsening hypoxia; CT showed worsening right hydropneumothorax with loculations in pleura. Emergent right chest tube placed. Pt intubated 11/30/19-12/01/19. Pt developed hematuria on 1/21. Pt underwent VATS on 2/4, intubated post-proceudre and extubated on 2/5.    PT Comments    Pt with noted cough/sneezing, RN aware and reports he's been doing all morning. Pt continues to be motivated. Pt with increased ambulation tolerance this date. Pt also with noted bilat LE edema, pt aware to elevate. Continue to require assist for ADLs due to edema, SOB, and flank pain. Acute PT to cont to follow.    Follow Up Recommendations  CIR     Equipment Recommendations  Rolling walker with 5" wheels;3in1 (PT)    Recommendations for Other Services       Precautions / Restrictions Precautions Precautions: Fall Precaution Comments: R chest tube, watch O2 Restrictions Weight Bearing Restrictions: No    Mobility  Bed Mobility               General bed mobility comments: pt up in chair upon PT  arrival  Transfers Overall transfer level: Needs assistance Equipment used: Rolling walker (2 wheeled) Transfers: Sit to/from Stand Sit to Stand: Min guard         General transfer comment: verbal cues for hand placement when standing and returning to sitting, no assist to power up  Ambulation/Gait Ambulation/Gait assistance: Min guard;+2 safety/equipment Gait Distance (Feet): 50 Feet(x1, 80x1) Assistive device: Rolling walker (2 wheeled) Gait Pattern/deviations: Step-through pattern Gait velocity: reduced Gait velocity interpretation: <1.8 ft/sec, indicate of risk for recurrent falls General Gait Details: slow, steady gait, seated rest break at 50', verbal cues for deep breathing   Stairs             Wheelchair Mobility    Modified Rankin (Stroke Patients Only)       Balance Overall balance assessment: Needs assistance Sitting-balance support: No upper extremity supported;Feet supported Sitting balance-Leahy Scale: Good     Standing balance support: Bilateral upper extremity supported Standing balance-Leahy Scale: Fair Standing balance comment: minG with BUE support of RW                            Cognition Arousal/Alertness: Awake/alert Behavior During Therapy: WFL for tasks assessed/performed Overall Cognitive Status: Within Functional Limits for tasks assessed                                        Exercises      General Comments General comments (skin integrity, edema, etc.): SpO2 >90% on 4Lo2  via [place] majority of session      Pertinent Vitals/Pain Pain Assessment: Faces Faces Pain Scale: Hurts little more Pain Location: chest Pain Descriptors / Indicators: Sore;Grimacing;Guarding Pain Intervention(s): Monitored during session    Home Living                      Prior Function            PT Goals (current goals can now be found in the care plan section) Progress towards PT goals: Progressing toward  goals    Frequency    Min 3X/week      PT Plan Current plan remains appropriate    Co-evaluation              AM-PAC PT "6 Clicks" Mobility   Outcome Measure  Help needed turning from your back to your side while in a flat bed without using bedrails?: A Little Help needed moving from lying on your back to sitting on the side of a flat bed without using bedrails?: A Little Help needed moving to and from a bed to a chair (including a wheelchair)?: A Little Help needed standing up from a chair using your arms (e.g., wheelchair or bedside chair)?: A Little Help needed to walk in hospital room?: A Little Help needed climbing 3-5 steps with a railing? : A Lot 6 Click Score: 17    End of Session Equipment Utilized During Treatment: Oxygen Activity Tolerance: Patient tolerated treatment well Patient left: in chair;with call bell/phone within reach;with family/visitor present Nurse Communication: Mobility status PT Visit Diagnosis: Unsteadiness on feet (R26.81);Muscle weakness (generalized) (M62.81)     Time: 0539-7673 PT Time Calculation (min) (ACUTE ONLY): 28 min  Charges:  $Gait Training: 23-37 mins                     Kittie Plater, PT, DPT Acute Rehabilitation Services Pager #: [phone] Office #: [phone]    Berline Lopes 12/24/2019, 1:22 PM

## 2019-12-24 NOTE — Progress Notes (Addendum)
[address]       [place] [address]             [phone]      6 Days Post-Op Procedure(s) (LRB): VIDEO ASSISTED THORACOSCOPY (VATS)/EMPYEMA (Right) Subjective: Feels ok, no significant change  Objective: Vital signs in last 24 hours: Temp:  [97.7 F (36.5 C)-98.5 F (36.9 C)] 98 F (36.7 C) (02/10 0350) Pulse Rate:  [89-113] 89 (02/10 0350) Cardiac Rhythm: Sinus tachycardia (02/10 0801) Resp:  [13-32] 16 (02/10 0500) BP: (133-159)/(89-104) 133/89 (02/10 0350) SpO2:  [0 %-100 %] 96 % (02/10 0500) FiO2 (%):  [0 %] 0 % (02/10 0500)  Hemodynamic parameters for last 24 hours:    Intake/Output from previous day: 02/09 0701 - 02/10 0700 In: 1637 [P.O.:337; I.V.:1200; IV Piggyback:100] Out: 1435 [Urine:1175; Chest Tube:260] Intake/Output this shift: No intake/output data recorded.  General appearance: alert, cooperative and no distress Heart: regular rate and rhythm Lungs: coarse and dimin right lung fields Abdomen: benign Extremities: no significant edema Wound: incis healing well  Lab Results: Recent Labs    12/23/19 0343 12/24/19 0740  WBC 8.5 9.0  HGB 8.2* 8.9*  HCT 26.8* 28.9*  PLT 446* 498*   BMET:  Recent Labs    12/22/19 0444 12/23/19 0343  NA 140 140  K 3.6 3.4*  CL 109 107  CO2 23 25  GLUCOSE 105* 108*  BUN 5* 7  CREATININE 0.72 0.69  CALCIUM 7.8* 8.0*    PT/INR: No results for input(s): LABPROT, INR in the last 72 hours. ABG    Component Value Date/Time   PHART 7.442 12/19/2019 1049   HCO3 25.0 12/19/2019 1049   TCO2 26 12/19/2019 1049   ACIDBASEDEF 4.1 (H) 11/09/2019 2210   O2SAT 98.0 12/19/2019 1049   CBG (last 3)  Recent Labs    12/23/19 1626 12/23/19 2121 12/24/19 0635  GLUCAP 125* 115* 98    Meds Scheduled Meds: . atorvastatin  10 mg Oral QPM  . bisacodyl  10 mg Oral Daily  . Chlorhexidine Gluconate Cloth  6 each Topical Daily  . enoxaparin (LOVENOX) injection  40 mg Subcutaneous Q24H  .  feeding supplement (ENSURE ENLIVE)  237 mL Oral TID BM  . fentaNYL   Intravenous Q4H  . guaiFENesin  600 mg Oral BID  . insulin aspart  0-24 Units Subcutaneous TID AC & HS  . levalbuterol  1.25 mg Inhalation BID  . mouth rinse  15 mL Mouth Rinse BID  . multivitamin with minerals  1 tablet Oral Daily  . pantoprazole  40 mg Oral Q1200  . senna-docusate  1 tablet Oral QHS  . sodium chloride flush  10-40 mL Intracatheter Q12H   Continuous Infusions: . sodium chloride    . sodium chloride 100 mL/hr at 12/24/19 0313  .  ceFAZolin (ANCEF) IV 2 g (12/24/19 0024)   PRN Meds:.Place/Maintain arterial line **AND** sodium chloride, diphenhydrAMINE **OR** diphenhydrAMINE, influenza vac split quadrivalent PF, naloxone **AND** sodium chloride flush, ondansetron (ZOFRAN) IV, oxyCODONE, sodium chloride flush, traMADol  Xrays DG Chest Port 1 View  Result Date: 12/23/2019 CLINICAL DATA:  Follow-up chest tube EXAM: PORTABLE CHEST 1 VIEW COMPARISON:  12/22/2019 FINDINGS: Cardiac shadow is enlarged. Three chest tubes are again seen on the right and stable in appearance. One of the inferior chest tubes has withdrawn with the proximal side port in the chest wall stable from the prior exam. Small basilar pneumothorax is seen. Persistent increased opacity in the right  lung base is noted likely related to the incomplete distension. Left lung remains clear. IMPRESSION: Stable changes in the right lung with small basilar pneumothorax identified. Electronically Signed   By: Inez Catalina M.D.   On: 12/23/2019 09:31    Assessment/Plan: S/P Procedure(s) (LRB): VIDEO ASSISTED THORACOSCOPY (VATS)/EMPYEMA (Right)  1 remains stable on current TX/RX 2 hemodyn stable with some hypertensive readings, will resume home zestril- renal fxn and GFR are normal 3 sats ok on 3 liters, cont pulm toilet/resp Rx 4 CXR is stable in appearance, 260 cc CT drainage yesterday- + large air leak- cont CT's to suction 5 no leukocytosis or  fevers on ancef 6 reactive thrombocytosis- cont lovenox for DVT PPX 7 H/H improving  8 CBG controlled- resume metformin 9 Push mobilization as able 10 protein shakes added for nutrition  LOS: 43 days    Nathaniel Webb [facility] 12/24/2019 Pager [phone]  Chest tube drainage is decreasing The patient will have some residual space-pneumothorax because of entrapment of his right lung from longstanding pneumonia.  Chest tube strategy will be to remove the posterior tube when drainage is less than 200 cc and then wean the suction on the anterior tube so that he could be discharged off suction to [facility] and followed as an outpatient.  Transition off PCA to oral pain medication with PRN  IV medicine which can be slowly weaned off.  patient examined and medical record reviewed,agree with above note. Tharon Aquas Trigt III 12/24/2019

## 2019-12-25 ENCOUNTER — Inpatient Hospital Stay (HOSPITAL_COMMUNITY): Payer: Medicaid Other

## 2019-12-25 LAB — BASIC METABOLIC PANEL
Anion gap: 7 (ref 5–15)
BUN: 7 mg/dL (ref 6–20)
CO2: 29 mmol/L (ref 22–32)
Calcium: 8.2 mg/dL — ABNORMAL LOW (ref 8.9–10.3)
Chloride: 103 mmol/L (ref 98–111)
Creatinine, Ser: 0.67 mg/dL (ref 0.61–1.24)
GFR calc Af Amer: >60 mL/min (ref >60)
GFR calc non Af Amer: >60 mL/min (ref >60)
Glucose, Bld: 105 mg/dL — ABNORMAL HIGH (ref 70–99)
Potassium: 3.6 mmol/L (ref 3.5–5.1)
Sodium: 139 mmol/L (ref 135–145)

## 2019-12-25 LAB — CBC
HCT: 26.5 % — ABNORMAL LOW (ref 39.0–52.0)
Hemoglobin: 8.3 g/dL — ABNORMAL LOW (ref 13.0–17.0)
MCH: 27.5 pg (ref 26.0–34.0)
MCHC: 31.3 g/dL (ref 30.0–36.0)
MCV: 87.7 fL (ref 80.0–100.0)
Platelets: 491 10*3/uL — ABNORMAL HIGH (ref 150–400)
RBC: 3.02 MIL/uL — ABNORMAL LOW (ref 4.22–5.81)
RDW: 16.7 % — ABNORMAL HIGH (ref 11.5–15.5)
WBC: 9.6 10*3/uL (ref 4.0–10.5)
nRBC: 0 % (ref 0.0–0.2)

## 2019-12-25 LAB — GLUCOSE, CAPILLARY
Glucose-Capillary: 100 mg/dL — ABNORMAL HIGH (ref 70–99)
Glucose-Capillary: 94 mg/dL (ref 70–99)
Glucose-Capillary: 128 mg/dL — ABNORMAL HIGH (ref 70–99)
Glucose-Capillary: 114 mg/dL — ABNORMAL HIGH (ref 70–99)

## 2019-12-25 NOTE — Plan of Care (Signed)

## 2019-12-25 NOTE — Progress Notes (Signed)
Physical Therapy Treatment Patient Details Name: Nathaniel Webb MRN: [id number] DOB: [birth year] Today's Date: 12/25/2019    History of Present Illness 54 y/o male w/ hx of Obesity, low HDL, HTN, DM, presented to [facility] (12/29) with approximately 1 week history of gradually worsening shortness of breath.  He was found to have acute hypoxic respiratory failure and was requiring high flow oxygen to maintain O2 saturations.  Chest x-ray was typical of Covid pneumonitis. Pt dx w/ COVID 12/23 and dc home w/ spouse. 1/10 CXR showed ARDS bilateral increasing opacification; respiratory status worsened; concerns for aspiration. Acute PE; loculated right pleural effusion; 1/11 CTS consult, recs chest tube. 1/12 CT chest with densely consolidated right lung, air fluid level/ hydropneumothorax. Thoracentesis 1/12; pleural fluid sampled and concerning for empyema.  1/13 emergently transferred to ICU for worsening hypoxia; CT showed worsening right hydropneumothorax with loculations in pleura. Emergent right chest tube placed. Pt intubated 11/30/19-12/01/19. Pt developed hematuria on 1/21. Pt underwent VATS on 2/4, intubated post-proceudre and extubated on 2/5.    PT Comments    Patient continues to be motivated to participate in therapy and is making progress toward mobility goals. Pt will continue to benefit from further skilled PT services to maximize independence and safety with mobility.    Follow Up Recommendations  CIR     Equipment Recommendations  Rolling walker with 5" wheels;3in1 (PT)    Recommendations for Other Services       Precautions / Restrictions Precautions Precautions: Fall Precaution Comments: R chest tube, watch O2 Restrictions Weight Bearing Restrictions: No    Mobility  Bed Mobility               General bed mobility comments: pt up in chair upon PT arrival  Transfers Overall transfer level: Needs assistance Equipment used: Rolling walker (2 wheeled) Transfers: Sit  to/from Stand Sit to Stand: Min guard         General transfer comment: verbal cues for safe hand placement   Ambulation/Gait Ambulation/Gait assistance: Min guard Gait Distance (Feet): (80 ft X 3 trials with 2 seated rest breaks ) Assistive device: Rolling walker (2 wheeled) Gait Pattern/deviations: Step-through pattern;Decreased stride length Gait velocity: reduced   General Gait Details: slow, steady gait; demonstrates carry over of breathing technique while mobilizing; rest breaks required due to DOE and increased RR    Stairs             Wheelchair Mobility    Modified Rankin (Stroke Patients Only)       Balance Overall balance assessment: Needs assistance Sitting-balance support: No upper extremity supported;Feet supported Sitting balance-Leahy Scale: Good     Standing balance support: Bilateral upper extremity supported Standing balance-Leahy Scale: Fair                              Cognition Arousal/Alertness: Awake/alert Behavior During Therapy: WFL for tasks assessed/performed Overall Cognitive Status: Within Functional Limits for tasks assessed                                        Exercises      General Comments General comments (skin integrity, edema, etc.): SpO2 92% on 4L O2 via [place], RR up to 40s, and HR into 120s with ambulation      Pertinent Vitals/Pain Pain Assessment: Faces Faces Pain Scale: Hurts a little bit  Pain Location: chest Pain Descriptors / Indicators: Sore;Discomfort Pain Intervention(s): Limited activity within patient's tolerance;Monitored during session;Repositioned    Home Living                      Prior Function            PT Goals (current goals can now be found in the care plan section) Progress towards PT goals: Progressing toward goals    Frequency    Min 3X/week      PT Plan Current plan remains appropriate    Co-evaluation              AM-PAC PT "6  Clicks" Mobility   Outcome Measure  Help needed turning from your back to your side while in a flat bed without using bedrails?: A Little Help needed moving from lying on your back to sitting on the side of a flat bed without using bedrails?: A Little Help needed moving to and from a bed to a chair (including a wheelchair)?: A Little Help needed standing up from a chair using your arms (e.g., wheelchair or bedside chair)?: A Little Help needed to walk in hospital room?: A Little Help needed climbing 3-5 steps with a railing? : A Lot 6 Click Score: 17    End of Session Equipment Utilized During Treatment: Oxygen Activity Tolerance: Patient tolerated treatment well Patient left: in chair;with call bell/phone within reach Nurse Communication: Mobility status PT Visit Diagnosis: Unsteadiness on feet (R26.81);Muscle weakness (generalized) (M62.81)     Time: 1610-9604 PT Time Calculation (min) (ACUTE ONLY): 37 min  Charges:  $Gait Training: 23-37 mins                     Erline Levine, PTA Acute Rehabilitation Services Pager: [phone] Office: [phone]     Carolynne Edouard 12/25/2019, 3:02 PM

## 2019-12-25 NOTE — Discharge Summary (Addendum)
Physician Discharge Summary        [address]       [place] [address]             [phone]       Patient ID: Nathaniel Webb MRN: [id number] DOB/AGE: 1966/07/22 54 y.o.  Admit date: 11/11/2019 Discharge date: 01/07/2020  Admission Diagnoses:  Patient Active Problem List   Diagnosis Date Noted   Empyema of pleural space ([place]) 12/18/2019   Discharge Diagnoses:  Principal Problem:   Empyema lung ([place]) Active Problems:   Type 2 diabetes, controlled, with peripheral neuropathy ([place])   Hypertension   Morbid obesity ([place])   History of COVID-19 pneumonia   Acute respiratory failure with hypoxemia ([facility])   Diabetes mellitus type 2, uncontrolled, with complications ([facility])   Acute pulmonary embolus ([facility])   Demand ischemia (H[facility])   Constipation   MSSA bacteremia   Hydropneumothorax   ARDS (adult respiratory distress syndrome) (H[facility])   Chest tube in place   Empyema of pleural space [facility])   Discharged Condition: good  HPI:  The patient is an obese 54 year old diabetic who was admitted to the hospital just after Christmas with COVID pneumonitis.  He had significant hypoxemia, but was managed without mechanical ventilation.  He developed a staphylococcal super pneumonia, staphylococcal empyema and staphylococcal bacteremia.  A chest tube was placed for treatment of a hydropneumothorax.  He did not improve with chest tube placement and was transferred to [facility] for further management.  Followup scan showed persistent hydropneumothorax with probable right pleural hemothorax related to systemic anticoagulation and 1 dose of lytic therapy through the chest tube for improvement of drainage.  He still had significant bilateral viral pneumonitis and was not a candidate for surgery.   Hospital Course:   Mr. Esco underwent a right video-assisted thoracoscopic surgery for a right-sided empyema with decortication of the right lower lobe on 12/18/2019 with Dr.  Darcey Nora.  Postop day 1 the patient remained in sinus tachycardia.  He remained intubated and lightly sedated.  We were working towards extubation at this time.  His hemoglobin and hematocrit remained stable.  We transfuse 1 unit of packed red blood cells in the OR.  We continue to follow his pleural peel and pleural fluid cultures and pathology.  The initial Gram stain showed white cells with no organisms.  Postop day 2 we mobilize the patient.  We initiated a diuretic regimen for fluid overload.  Chest tubes remain stable.  Postop day 3 he had some increased subpulmonic space on the right so we increase the chest tube suction.  Pleural peel cultures remained negative and his white blood cell count remained normal.  The patient was stable for transfer to the stepdown unit at this time. He continued to improve. He remained on 3L which we worked on weaning. He did have some increased subQ air with one chest tube in the subcutaneous tissue. Cultures were staph which was pan sensitive to Ancef. POD 5 we discontinued his posterior chest tube. He continued to have a large air leak. We continued the remaining chest tubes on 20cm. We started him back on Lisinopril for his blood pressure and metformin for his diabetes mellitus. We placed some talc slurry in his chest tube on 2/19 for a chemical pleurodesis. He toelrated the procedure well. His remaining chest tube was removed on 2/23. His follow-up CXR showed stable right lateral pleural thickening and lower atelectasis, no distinct pneumothorax. Today, he is tolerating room air,  his incisions are healing well, he is ambulating with limited assistance, and he is ready for discharge home.   Consults: None  Significant Diagnostic Studies:   CLINICAL DATA:  Postop day 6 RIGHT-sided empyema drainage with VATS. Follow-up RIGHT basilar pneumothorax with chest tubes in place.   EXAM: PORTABLE CHEST 1 VIEW   COMPARISON:  12/23/2019 and earlier.   FINDINGS: Cardiac  silhouette upper normal in size for AP portable technique. Two RIGHT chest tubes in place with residual small (5-10% or so) RIGHT basilar pneumothorax. Residual RIGHT LATERAL pleural thickening at the site of the prior empyema drainage. Dense RIGHT basilar atelectasis and patchy airspace opacities throughout the RIGHT lung, unchanged. LEFT lung remains clear. Subcutaneous emphysema throughout the RIGHT chest wall and neck, unchanged.   IMPRESSION: 1. Residual small (5-10% or so) RIGHT basilar pneumothorax with RIGHT chest tubes in place. 2. Stable dense RIGHT basilar atelectasis and patchy airspace opacities throughout the RIGHT lung. 3. Stable subcutaneous emphysema throughout the RIGHT chest wall and neck. 4. No new abnormalities.     Electronically Signed   By: Hulan Saas M.D.   On: 12/24/2019 09:17   Treatments:  NAME: YANDELL, MCJUNKINS MEDICAL RECORD HQ:[id number] ACCOUNT [id number] DATE OF BIRTH:1966/08/22 FACILITY: [facility] LOCATION: [facility] PHYSICIAN:Mazy Culton VAN TRIGT III, MD   OPERATIVE REPORT   DATE OF PROCEDURE:  12/18/2019   OPERATION: 1.  Right video-assisted thoracoscopic surgery (VATS) for drainage of right empyema. 2.  Decortication of right lower lobe and removal of right pleural space hematoma.   SURGEON:  Kerin Perna, MD   ASSISTANT:  Jari Favre, PA-C.   ANESTHESIA:  General by Dr. Arta Bruce.   PREOPERATIVE DIAGNOSES:   1.  History of COVID pneumonitis. 2.  Methicillin-sensitive Staphylococcus aureus pneumonia infection with methicillin-sensitive Staphylococcus aureus empyema, sepsis and bacteremia. 3.  Past history of obesity, diabetes and dyslipidemia.   POSTOPERATIVE DIAGNOSES:   1.  History of COVID pneumonitis. 2.  Methicillin-sensitive Staphylococcus aureus pneumonia infection with methicillin-sensitive Staphylococcus aureus empyema, sepsis and bacteremia. 3.  Past history of obesity, diabetes    Discharge Exam: Blood pressure  109/80, pulse 100, temperature 98.6 F (37 C), temperature source Oral, resp. rate 18, height 5\' 5"  (1.651 m), weight 86.5 kg, SpO2 95 %.   General appearance: alert, cooperative and no distress Heart: regular rate and rhythm, S1, S2 normal, no murmur, click, rub or gallop Lungs: coarse breath sounds Abdomen: soft, non-tender; bowel sounds normal; no masses,  no organomegaly Extremities: extremities normal, atraumatic, no cyanosis or edema Wound: clean and dry  Disposition:  Discharge disposition: 01-Home or Self Care       Discharge Instructions     MyChart COVID-19 home monitoring program   Complete by: Jan 07, 2020    Is the patient willing to use the MyChart Mobile App for home monitoring?: No      Allergies as of 01/07/2020   No Known Allergies      Medication List     TAKE these medications    acetaminophen 325 MG tablet Commonly known as: TYLENOL Take 2 tablets (650 mg total) by mouth every 6 (six) hours as needed for mild pain or headache (fever >/= 101).   amoxicillin-clavulanate 875-125 MG tablet Commonly known as: AUGMENTIN Take 1 tablet by mouth every 12 (twelve) hours.   aspirin EC 81 MG tablet Take 81 mg by mouth daily.   atorvastatin 10 MG tablet Commonly known as: LIPITOR Take 10 mg by mouth every evening.  Fish Oil 1000 MG Caps Take 1,000 mg by mouth daily.   insulin aspart 100 UNIT/ML injection Commonly known as: novoLOG Inject 0-9 Units into the skin every 4 (four) hours.   lisinopril 20 MG tablet Commonly known as: ZESTRIL Take 20 mg by mouth daily.   metFORMIN 500 MG tablet Commonly known as: GLUCOPHAGE Take 500 mg by mouth daily with breakfast.   oxyCODONE 5 MG immediate release tablet Commonly known as: Oxy IR/ROXICODONE Take 1 tablet (5 mg total) by mouth every 6 (six) hours as needed for moderate pain.       Follow-up Information     I[place]abachie, Chioma E, NP. Call in 1 day(s).   Specialty: Gerontology Contact  information: [address] [place] [address] [phone]         Kerin Perna, MD Follow up.   Specialty: Cardiothoracic Surgery Why: Please refer to the AVS for appointment time and date. Arrive 30 minutes prior for a chest xray located at [facility].  Contact information: [address] [place] [address] [phone]            Signed: Sharlene Dory 01/07/2020, 7:43 AM  Chest incisions clean and dry Today's chest x-ray personally reviewed and is satisfactory after chest tube removal Patient ready for discharge-discharge instructions reviewed with patient including wound care, activity, meds and follow-up appointment patient examined and medical record reviewed,agree with above note. Kathlee Nations Trigt III 01/07/2020

## 2019-12-25 NOTE — Progress Notes (Addendum)
[address]       [place] [address]             [phone]      7 Days Post-Op Procedure(s) (LRB): VIDEO ASSISTED THORACOSCOPY (VATS)/EMPYEMA (Right) Subjective: Feels pretty well, ambulation conts to improve  Objective: Vital signs in last 24 hours: Temp:  [97.8 F (36.6 C)-98.6 F (37 C)] 98.5 F (36.9 C) (02/11 0753) Pulse Rate:  [86-106] 106 (02/11 0753) Cardiac Rhythm: Sinus tachycardia (02/11 0705) Resp:  [16-26] 22 (02/11 0753) BP: (116-144)/(24-93) 132/88 (02/11 0753) SpO2:  [93 %-100 %] 98 % (02/11 0753) FiO2 (%):  [0 %] 0 % (02/10 1140)  Hemodynamic parameters for last 24 hours:    Intake/Output from previous day: 02/10 0701 - 02/11 0700 In: 3214.3 [P.O.:784; I.V.:1930.3; IV Piggyback:500] Out: 2846 [Urine:2700; Chest Tube:146] Intake/Output this shift: Total I/O In: 240 [P.O.:240] Out: 300 [Urine:300]  General appearance: alert, cooperative and no distress Heart: regular rate and rhythm and tachy Lungs: coarse BS on right Abdomen: benign Extremities: no edema or calf tnderness Wound: incis healing well  Lab Results: Recent Labs    12/24/19 0740 12/25/19 0340  WBC 9.0 9.6  HGB 8.9* 8.3*  HCT 28.9* 26.5*  PLT 498* 491*   BMET:  Recent Labs    12/24/19 0740 12/25/19 0340  NA 141 139  K 3.9 3.6  CL 107 103  CO2 27 29  GLUCOSE 142* 105*  BUN 8 7  CREATININE 0.72 0.67  CALCIUM 8.2* 8.2*    PT/INR: No results for input(s): LABPROT, INR in the last 72 hours. ABG    Component Value Date/Time   PHART 7.442 12/19/2019 1049   HCO3 25.0 12/19/2019 1049   TCO2 26 12/19/2019 1049   ACIDBASEDEF 4.1 (H) 11/09/2019 2210   O2SAT 98.0 12/19/2019 1049   CBG (last 3)  Recent Labs    12/24/19 1633 12/24/19 2114 12/25/19 0626  GLUCAP 129* 85 100*    Meds Scheduled Meds: . atorvastatin  10 mg Oral QPM  . bisacodyl  10 mg Oral Daily  . Chlorhexidine Gluconate Cloth  6 each Topical Daily  . enoxaparin (LOVENOX)  injection  40 mg Subcutaneous Q24H  . feeding supplement (ENSURE ENLIVE)  237 mL Oral TID BM  . fentaNYL   Intravenous Q4H  . guaiFENesin  600 mg Oral BID  . insulin aspart  0-24 Units Subcutaneous TID AC & HS  . lisinopril  20 mg Oral Daily  . mouth rinse  15 mL Mouth Rinse BID  . metFORMIN  500 mg Oral Q breakfast  . multivitamin with minerals  1 tablet Oral Daily  . pantoprazole  40 mg Oral Q1200  . senna-docusate  1 tablet Oral QHS  . sodium chloride flush  10-40 mL Intracatheter Q12H   Continuous Infusions: . sodium chloride    . sodium chloride 100 mL/hr at 12/25/19 0300   PRN Meds:.Place/Maintain arterial line **AND** sodium chloride, diphenhydrAMINE **OR** diphenhydrAMINE, influenza vac split quadrivalent PF, levalbuterol, naloxone **AND** sodium chloride flush, ondansetron (ZOFRAN) IV, oxyCODONE, sodium chloride flush, traMADol  Xrays DG CHEST PORT 1 VIEW  Result Date: 12/24/2019 CLINICAL DATA:  Postop day 6 RIGHT-sided empyema drainage with VATS. Follow-up RIGHT basilar pneumothorax with chest tubes in place. EXAM: PORTABLE CHEST 1 VIEW COMPARISON:  12/23/2019 and earlier. FINDINGS: Cardiac silhouette upper normal in size for AP portable technique. Two RIGHT chest tubes in place with residual small (5-10% or so) RIGHT basilar pneumothorax. Residual  RIGHT LATERAL pleural thickening at the site of the prior empyema drainage. Dense RIGHT basilar atelectasis and patchy airspace opacities throughout the RIGHT lung, unchanged. LEFT lung remains clear. Subcutaneous emphysema throughout the RIGHT chest wall and neck, unchanged. IMPRESSION: 1. Residual small (5-10% or so) RIGHT basilar pneumothorax with RIGHT chest tubes in place. 2. Stable dense RIGHT basilar atelectasis and patchy airspace opacities throughout the RIGHT lung. 3. Stable subcutaneous emphysema throughout the RIGHT chest wall and neck. 4. No new abnormalities. Electronically Signed   By: Evangeline Dakin M.D.   On: 12/24/2019  09:17    Assessment/Plan: S/P Procedure(s) (LRB): VIDEO ASSISTED THORACOSCOPY (VATS)/EMPYEMA (Right)  1 stable hemodyn , minor tachy at times. Back on lisinopril for BP 2 sats good on 3 liters, cont pulm toilet/RX 3 CT's 146 cc- will d/c post tube today and leave ant tube for air leak management. CXR shows some improvement in ASD, conts ancef- no fevers or leukocytosis 4 H/H slightly decreased from prev reading- monitor clinically 5 BS adeq controlled- metformin resumed 6 cont to push mobilizarion as able- improved 7 push nutrition    LOS: 44 days    John Giovanni PA-C 12/25/2019 Pager [phone]  Remove posterior tube then wean suction from anterior tube   patient examined and medical record reviewed,agree with above note. Tharon Aquas Trigt III 12/25/2019

## 2019-12-26 ENCOUNTER — Inpatient Hospital Stay (HOSPITAL_COMMUNITY): Payer: Medicaid Other

## 2019-12-26 LAB — CBC
HCT: 26.7 % — ABNORMAL LOW (ref 39.0–52.0)
Hemoglobin: 8.2 g/dL — ABNORMAL LOW (ref 13.0–17.0)
MCH: 27.2 pg (ref 26.0–34.0)
MCHC: 30.7 g/dL (ref 30.0–36.0)
MCV: 88.7 fL (ref 80.0–100.0)
Platelets: 508 10*3/uL — ABNORMAL HIGH (ref 150–400)
RBC: 3.01 MIL/uL — ABNORMAL LOW (ref 4.22–5.81)
RDW: 17 % — ABNORMAL HIGH (ref 11.5–15.5)
WBC: 10.2 10*3/uL (ref 4.0–10.5)
nRBC: 0 % (ref 0.0–0.2)

## 2019-12-26 LAB — BASIC METABOLIC PANEL
Anion gap: 7 (ref 5–15)
BUN: 8 mg/dL (ref 6–20)
CO2: 30 mmol/L (ref 22–32)
Calcium: 8.2 mg/dL — ABNORMAL LOW (ref 8.9–10.3)
Chloride: 103 mmol/L (ref 98–111)
Creatinine, Ser: 0.78 mg/dL (ref 0.61–1.24)
GFR calc Af Amer: >60 mL/min (ref >60)
GFR calc non Af Amer: >60 mL/min (ref >60)
Glucose, Bld: 98 mg/dL (ref 70–99)
Potassium: 3.5 mmol/L (ref 3.5–5.1)
Sodium: 140 mmol/L (ref 135–145)

## 2019-12-26 LAB — GLUCOSE, CAPILLARY
Glucose-Capillary: 91 mg/dL (ref 70–99)
Glucose-Capillary: 102 mg/dL — ABNORMAL HIGH (ref 70–99)
Glucose-Capillary: 102 mg/dL — ABNORMAL HIGH (ref 70–99)
Glucose-Capillary: 126 mg/dL — ABNORMAL HIGH (ref 70–99)

## 2019-12-26 MED ORDER — POTASSIUM CHLORIDE CRYS ER 20 MEQ PO TBCR
40.0000 meq | EXTENDED_RELEASE_TABLET | Freq: Once | ORAL | Status: AC
  Administered 2019-12-26: 09:00:00 40 meq via ORAL
  Filled 2019-12-26: qty 2

## 2019-12-26 NOTE — Progress Notes (Addendum)
Inpatient Rehabilitation-Admissions Coordinator   Met with pt this AM after PT session. Pt has progressed well with therapies and feels confident in his mobility. Pt is in favor of returning straight home once medically ready.   AC will sign off.   Raechel Ache, OTR/L  Rehab Admissions Coordinator  [phone] 12/26/2019 12:31 PM

## 2019-12-26 NOTE — Progress Notes (Addendum)
[address]       [place] [address]             [phone]      8 Days Post-Op Procedure(s) (LRB): VIDEO ASSISTED THORACOSCOPY (VATS)/EMPYEMA (Right) Subjective: Pain is improved with only one CT at this time,+  non-prod cough, + air leak persists  Objective: Vital signs in last 24 hours: Temp:  [97.8 F (36.6 C)-98.6 F (37 C)] 98.6 F (37 C) (02/12 0753) Pulse Rate:  [93-114] 97 (02/12 0755) Cardiac Rhythm: Sinus tachycardia (02/11 1900) Resp:  [17-26] 24 (02/12 0753) BP: (116-145)/(78-94) 123/89 (02/12 0755) SpO2:  [96 %-100 %] 100 % (02/12 0753) Weight:  [93.7 kg] 93.7 kg (02/12 1696)  Hemodynamic parameters for last 24 hours:    Intake/Output from previous day: 02/11 0701 - 02/12 0700 In: 2976.8 [P.O.:480; I.V.:2496.8] Out: 3170 [Urine:2850; Chest Tube:320] Intake/Output this shift: No intake/output data recorded.  General appearance: alert, cooperative and no distress Heart: regular rate and rhythm Lungs: clear to auscultation bilaterally Abdomen: benign Extremities: no edema or calf tenderness Wound: incis healing well  Lab Results: Recent Labs    12/25/19 0340 12/26/19 0414  WBC 9.6 10.2  HGB 8.3* 8.2*  HCT 26.5* 26.7*  PLT 491* 508*   BMET:  Recent Labs    12/25/19 0340 12/26/19 0414  NA 139 140  K 3.6 3.5  CL 103 103  CO2 29 30  GLUCOSE 105* 98  BUN 7 8  CREATININE 0.67 0.78  CALCIUM 8.2* 8.2*    PT/INR: No results for input(s): LABPROT, INR in the last 72 hours. ABG    Component Value Date/Time   PHART 7.442 12/19/2019 1049   HCO3 25.0 12/19/2019 1049   TCO2 26 12/19/2019 1049   ACIDBASEDEF 4.1 (H) 11/09/2019 2210   O2SAT 98.0 12/19/2019 1049   CBG (last 3)  Recent Labs    12/25/19 1602 12/25/19 2126 12/26/19 0630  GLUCAP 128* 114* 91    Meds Scheduled Meds: . atorvastatin  10 mg Oral QPM  . bisacodyl  10 mg Oral Daily  . Chlorhexidine Gluconate Cloth  6 each Topical Daily  . enoxaparin  (LOVENOX) injection  40 mg Subcutaneous Q24H  . feeding supplement (ENSURE ENLIVE)  237 mL Oral TID BM  . fentaNYL   Intravenous Q4H  . guaiFENesin  600 mg Oral BID  . insulin aspart  0-24 Units Subcutaneous TID AC & HS  . lisinopril  20 mg Oral Daily  . mouth rinse  15 mL Mouth Rinse BID  . metFORMIN  500 mg Oral Q breakfast  . multivitamin with minerals  1 tablet Oral Daily  . pantoprazole  40 mg Oral Q1200  . senna-docusate  1 tablet Oral QHS  . sodium chloride flush  10-40 mL Intracatheter Q12H   Continuous Infusions: . sodium chloride    . sodium chloride 100 mL/hr at 12/26/19 0750   PRN Meds:.Place/Maintain arterial line **AND** sodium chloride, diphenhydrAMINE **OR** diphenhydrAMINE, influenza vac split quadrivalent PF, levalbuterol, naloxone **AND** sodium chloride flush, ondansetron (ZOFRAN) IV, oxyCODONE, sodium chloride flush, traMADol  Xrays DG Chest Port 1 View  Result Date: 12/25/2019 CLINICAL DATA:  Follow-up chest tube EXAM: PORTABLE CHEST 1 VIEW COMPARISON:  12/24/2019 FINDINGS: Persistent subcutaneous emphysema is noted on the right. Two chest tubes are again noted with evidence of mild persistent right basilar pneumothorax. Left lung is clear. No acute bony abnormality is noted. IMPRESSION: Stable appearance of the chest with persistent right  basilar pneumothorax. Electronically Signed   By: Alcide Clever M.D.   On: 12/25/2019 09:34   Results for orders placed or performed during the hospital encounter of 11/11/19  Culture, blood (routine x 2)     Status: Abnormal   Collection Time: 11/23/19  2:10 PM   Specimen: BLOOD  Result Value Ref Range Status   Specimen Description   Final    BLOOD LEFT ARM Performed at [facility], [address] [place], [place] [address]    Special Requests   Final    BOTTLES DRAWN AEROBIC ONLY Blood Culture adequate volume Performed at [facility], [address], [place], [place] [address]     Culture  Setup Time   Final    AEROBIC BOTTLE ONLY GRAM POSITIVE COCCI IN CLUSTERS CRITICAL RESULT CALLED TO, READ BACK BY AND VERIFIED WITH: E. WILLIAMSON, PHARMD (GVC) AT 1255 ON 11/24/19 BY C. JESSUP, MT.    Culture (A)  Final    STAPHYLOCOCCUS AUREUS SUSCEPTIBILITIES PERFORMED ON PREVIOUS CULTURE WITHIN THE LAST 5 DAYS. Performed at [facility], [address], [place], [place] [address]    Report Status 11/26/2019 FINAL  Final  Culture, blood (routine x 2)     Status: Abnormal   Collection Time: 11/23/19  2:15 PM   Specimen: BLOOD  Result Value Ref Range Status   Specimen Description   Final    BLOOD LEFT HAND Performed at [facility], [address], [place], [place] [address]    Special Requests   Final    BOTTLES DRAWN AEROBIC ONLY Blood Culture adequate volume Performed at [facility], [address] [place], [place] [address]    Culture  Setup Time   Final    GRAM POSITIVE COCCI IN CLUSTERS AEROBIC BOTTLE ONLY CRITICAL RESULT CALLED TO, READ BACK BY AND VERIFIED WITH: K AMEND [facility] 2202 11/24/19 A BROWNING Performed at [facility], [address], [place], [place] [address]    Culture STAPHYLOCOCCUS AUREUS (A)  Final   Report Status 11/26/2019 FINAL  Final   Organism ID, Bacteria STAPHYLOCOCCUS AUREUS  Final      Susceptibility   Staphylococcus aureus - MIC*    CIPROFLOXACIN <=0.5 SENSITIVE Sensitive     ERYTHROMYCIN >=8 RESISTANT Resistant     GENTAMICIN <=0.5 SENSITIVE Sensitive     OXACILLIN <=0.25 SENSITIVE Sensitive     TETRACYCLINE <=1 SENSITIVE Sensitive     VANCOMYCIN 1 SENSITIVE Sensitive     TRIMETH/SULFA <=10 SENSITIVE Sensitive     CLINDAMYCIN RESISTANT Resistant     RIFAMPIN <=0.5 SENSITIVE Sensitive     Inducible Clindamycin POSITIVE Resistant     * STAPHYLOCOCCUS AUREUS  Blood Culture ID Panel (Reflexed)     Status: Abnormal   Collection Time: 11/23/19  2:15 PM  Result Value Ref Range Status     Enterococcus species NOT DETECTED NOT DETECTED Final   Listeria monocytogenes NOT DETECTED NOT DETECTED Final   Staphylococcus species DETECTED (A) NOT DETECTED Final    Comment: CRITICAL RESULT CALLED TO, READ BACK BY AND VERIFIED WITH: K AMEND PHARMD 2202 11/24/19 A BROWNING    Staphylococcus aureus (BCID) DETECTED (A) NOT DETECTED Final    Comment: Methicillin (oxacillin) susceptible Staphylococcus aureus (MSSA). Preferred therapy is anti staphylococcal beta lactam antibiotic (Cefazolin or Nafcillin), unless clinically contraindicated. CRITICAL RESULT CALLED TO, READ BACK BY AND VERIFIED WITH: K AMEND PHARMD 2202 11/24/19 A BROWNING    Methicillin  resistance NOT DETECTED NOT DETECTED Final   Streptococcus species NOT DETECTED NOT DETECTED Final   Streptococcus agalactiae NOT DETECTED NOT DETECTED Final   Streptococcus pneumoniae NOT DETECTED NOT DETECTED Final   Streptococcus pyogenes NOT DETECTED NOT DETECTED Final   Acinetobacter baumannii NOT DETECTED NOT DETECTED Final   Enterobacteriaceae species NOT DETECTED NOT DETECTED Final   Enterobacter cloacae complex NOT DETECTED NOT DETECTED Final   Escherichia coli NOT DETECTED NOT DETECTED Final   Klebsiella oxytoca NOT DETECTED NOT DETECTED Final   Klebsiella pneumoniae NOT DETECTED NOT DETECTED Final   Proteus species NOT DETECTED NOT DETECTED Final   Serratia marcescens NOT DETECTED NOT DETECTED Final   Haemophilus influenzae NOT DETECTED NOT DETECTED Final   Neisseria meningitidis NOT DETECTED NOT DETECTED Final   Pseudomonas aeruginosa NOT DETECTED NOT DETECTED Final   Candida albicans NOT DETECTED NOT DETECTED Final   Candida glabrata NOT DETECTED NOT DETECTED Final   Candida krusei NOT DETECTED NOT DETECTED Final   Candida parapsilosis NOT DETECTED NOT DETECTED Final   Candida tropicalis NOT DETECTED NOT DETECTED Final    Comment: Performed at [facility], [address] [place], [place] [address]  Body fluid  culture     Status: None   Collection Time: 11/25/19 11:33 AM   Specimen: Pleura; Body Fluid  Result Value Ref Range Status   Specimen Description   Final    PLEURAL RT Performed at [facility], [address] [place], [place] [address]    Special Requests   Final    NONE Performed at [facility], [address], [place], [place] [address]    Gram Stain   Final    MODERATE WBC PRESENT, PREDOMINANTLY PMN RARE GRAM POSITIVE COCCI IN PAIRS Performed at [facility], [address] [place], [place] [address]    Culture MODERATE STAPHYLOCOCCUS AUREUS  Final   Report Status 11/28/2019 FINAL  Final   Organism ID, Bacteria STAPHYLOCOCCUS AUREUS  Final      Susceptibility   Staphylococcus aureus - MIC*    CIPROFLOXACIN <=0.5 SENSITIVE Sensitive     ERYTHROMYCIN >=8 RESISTANT Resistant     GENTAMICIN <=0.5 SENSITIVE Sensitive     OXACILLIN <=0.25 SENSITIVE Sensitive     TETRACYCLINE <=1 SENSITIVE Sensitive     VANCOMYCIN 1 SENSITIVE Sensitive     TRIMETH/SULFA <=10 SENSITIVE Sensitive     CLINDAMYCIN RESISTANT Resistant     RIFAMPIN <=0.5 SENSITIVE Sensitive     Inducible Clindamycin POSITIVE Resistant     * MODERATE STAPHYLOCOCCUS AUREUS  Culture, blood (routine x 2)     Status: None   Collection Time: 11/25/19  7:05 PM   Specimen: BLOOD  Result Value Ref Range Status   Specimen Description   Final    BLOOD Performed at [facility], [address], [place], [place] [address]    Special Requests   Final    Normal Performed at [facility], [address], [place], [place] [address]    Culture   Final    NO GROWTH 5 DAYS Performed at [facility], [address], [place], [place] [address]    Report Status 12/01/2019 FINAL  Final  MRSA PCR Screening     Status: None   Collection Time: 12/05/19  9:17 AM   Specimen: Nasal Mucosa; Nasopharyngeal  Result Value Ref Range Status   MRSA by  PCR NEGATIVE NEGATIVE Final  Comment:        The GeneXpert MRSA Assay (FDA approved for NASAL specimens only), is one component of a comprehensive MRSA colonization surveillance program. It is not intended to diagnose MRSA infection nor to guide or monitor treatment for MRSA infections. Performed at [facility], [address] [place], [place] [address]   Culture, fungus without smear     Status: None (Preliminary result)   Collection Time: 12/18/19  9:09 AM   Specimen: Pleural, Right; Body Fluid  Result Value Ref Range Status   Specimen Description PLEURAL RIGHT  Final   Special Requests PATIENT ON FOLLOWING ANCEF  Final   Culture   Final    NO FUNGUS ISOLATED AFTER 7 DAYS Performed at [facility], [address] [place], [place] [address]    Report Status PENDING  Incomplete  Aerobic/Anaerobic Culture (surgical/deep wound)     Status: None   Collection Time: 12/18/19  9:09 AM   Specimen: Pleural, Right; Body Fluid  Result Value Ref Range Status   Specimen Description PLEURAL RIGHT  Final   Special Requests PATIENT ON FOLLOWING ANCEF  Final   Gram Stain   Final    ABUNDANT WBC PRESENT, PREDOMINANTLY PMN NO ORGANISMS SEEN    Culture   Final    No growth aerobically or anaerobically. Performed at [facility], [address] [place], [place] [address]    Report Status 12/23/2019 FINAL  Final  Acid Fast Smear (AFB)     Status: None   Collection Time: 12/18/19  9:09 AM   Specimen: Pleural, Right; Body Fluid  Result Value Ref Range Status   AFB Specimen Processing Concentration  Final   Acid Fast Smear Negative  Final    Comment: (NOTE) Performed At: [facility] [address] [place], [place] [id number] Jolene Schimke MD SE:[id number]    Source (AFB) PLEURAL  Final    Comment: RIGHT Performed at [facility], [address] [place], [place] [address]   Fungus Culture With Stain     Status: None (Preliminary result)    Collection Time: 12/18/19  9:54 AM   Specimen: Soft Tissue, Other  Result Value Ref Range Status   Fungus Stain Final report  Final    Comment: (NOTE) Performed At: [facility] [address] [place], [place] [id number] Jolene Schimke MD [id number]    Fungus (Mycology) Culture PENDING  Incomplete   Fungal Source RIGHT  Final    Comment: PLEURAL PEEL Performed at [facility], [address], [place], [place] [address]   Aerobic/Anaerobic Culture (surgical/deep wound)     Status: None   Collection Time: 12/18/19  9:54 AM   Specimen: Soft Tissue, Other  Result Value Ref Range Status   Specimen Description TISSUE RIGHT PLEURAL PEEL  Final   Special Requests PATIENT ON FOLLOWING ANCEF  Final   Gram Stain   Final    ABUNDANT WBC PRESENT, PREDOMINANTLY PMN NO ORGANISMS SEEN    Culture   Final    No growth aerobically or anaerobically. Performed at [facility], [address], [place], [place] [address]    Report Status 12/23/2019 FINAL  Final  Acid Fast Smear (AFB)     Status: None   Collection Time: 12/18/19  9:54 AM   Specimen: Soft Tissue, Other  Result Value Ref Range Status   AFB Specimen Processing Concentration  Final   Acid Fast Smear Negative  Final  Comment: (NOTE) Performed At: [facility] [address] [place], [place] [id number] Jolene Schimke MD [id number]    Source (AFB) RIGHT  Final    Comment: PLEURAL PEEL Performed at [facility], [address] [place], [place] [address]   Fungus Culture Result     Status: None   Collection Time: 12/18/19  9:54 AM  Result Value Ref Range Status   Result 1 Comment  Final    Comment: (NOTE) KOH/Calcofluor preparation:  no fungus observed. Performed At: [facility] [address] [place], [place] [id number] Jolene Schimke MD [id number]     Assessment/Plan: S/P Procedure(s) (LRB): VIDEO ASSISTED THORACOSCOPY (VATS)/EMPYEMA (Right)  1 remains stable on  current RX/TX- cont CT to suction with air leak, hoping to avoid further procedures.  2 BP control is better back on lisinopril 3 sats good on 3 liters, wean as able, cont pulm toilet/RX- has had course of IV ancef- monitor clinically 4 replace K+  5 no leukocytosis 6 Anemia is stable 8 mobilize as able, eating well   LOS: 45 days    Rowe Clack [facility] 12/26/2019 Pager [phone]  CXR reviewed and patient examined Cont chest tube drainage of air leak following surgery for Covid-19 pneumonia and MSSA empyema, bacteremia  P Donata Clay MD

## 2019-12-26 NOTE — Plan of Care (Signed)

## 2019-12-26 NOTE — Progress Notes (Signed)
RN wasted 71ml/50mcg fentanyl pca in stericycle. Witnessed by Audie Box, RN.

## 2019-12-26 NOTE — Progress Notes (Signed)
Physical Therapy Treatment Patient Details Name: Nathaniel Webb MRN: [id number] DOB: August 21, 1966 Today's Date: 12/26/2019    History of Present Illness 54 y/o male w/ hx of Obesity, low HDL, HTN, DM, presented to [facility] (12/29) with approximately 1 week history of gradually worsening shortness of breath.  He was found to have acute hypoxic respiratory failure and was requiring high flow oxygen to maintain O2 saturations.  Chest x-ray was typical of Covid pneumonitis. Pt dx w/ COVID 12/23 and dc home w/ spouse. 1/10 CXR showed ARDS bilateral increasing opacification; respiratory status worsened; concerns for aspiration. Acute PE; loculated right pleural effusion; 1/11 CTS consult, recs chest tube. 1/12 CT chest with densely consolidated right lung, air fluid level/ hydropneumothorax. Thoracentesis 1/12; pleural fluid sampled and concerning for empyema.  1/13 emergently transferred to ICU for worsening hypoxia; CT showed worsening right hydropneumothorax with loculations in pleura. Emergent right chest tube placed. Pt intubated 11/30/19-12/01/19. Pt developed hematuria on 1/21. Pt underwent VATS on 2/4, intubated post-proceudre and extubated on 2/5.    PT Comments    Patient continues to make progress toward PT goals and tolerated increased mobility this session. Pt does continue to present with generalized weakness and DOE and will continue to benefit from further skilled PT services to maximize independence and safety with mobility.     Follow Up Recommendations  CIR     Equipment Recommendations  Rolling walker with 5" wheels;3in1 (PT)    Recommendations for Other Services       Precautions / Restrictions Precautions Precautions: Fall Precaution Comments: R chest tube, watch O2 Restrictions Weight Bearing Restrictions: No    Mobility  Bed Mobility Overal bed mobility: Modified Independent             General bed mobility comments: increased time/effort and use of  rail  Transfers Overall transfer level: Needs assistance Equipment used: Rolling walker (2 wheeled) Transfers: Sit to/from Stand Sit to Stand: Supervision         General transfer comment: supervision for safety  Ambulation/Gait Ambulation/Gait assistance: Min guard Gait Distance (Feet): (~250 ft total with a standing and a seated break) Assistive device: Rolling walker (2 wheeled) Gait Pattern/deviations: Step-through pattern;Decreased stride length Gait velocity: reduced   General Gait Details: slow, steady gait; demonstrates carry over of breathing technique while mobilizing; rest breaks required due to DOE and increased RR    Stairs             Wheelchair Mobility    Modified Rankin (Stroke Patients Only)       Balance Overall balance assessment: Needs assistance Sitting-balance support: No upper extremity supported;Feet supported Sitting balance-Leahy Scale: Good     Standing balance support: Bilateral upper extremity supported Standing balance-Leahy Scale: Fair                              Cognition Arousal/Alertness: Awake/alert Behavior During Therapy: WFL for tasks assessed/performed Overall Cognitive Status: Within Functional Limits for tasks assessed                                        Exercises      General Comments General comments (skin integrity, edema, etc.): RR into 30s, HR in 120s, and pt on 4L O2      Pertinent Vitals/Pain Pain Assessment: Faces Faces Pain Scale: Hurts a little bit Pain  Location: chest Pain Descriptors / Indicators: Discomfort Pain Intervention(s): Monitored during session    Home Living                      Prior Function            PT Goals (current goals can now be found in the care plan section) Progress towards PT goals: Progressing toward goals    Frequency    Min 3X/week      PT Plan Current plan remains appropriate    Co-evaluation               AM-PAC PT "6 Clicks" Mobility   Outcome Measure  Help needed turning from your back to your side while in a flat bed without using bedrails?: A Little Help needed moving from lying on your back to sitting on the side of a flat bed without using bedrails?: A Little Help needed moving to and from a bed to a chair (including a wheelchair)?: A Little Help needed standing up from a chair using your arms (e.g., wheelchair or bedside chair)?: A Little Help needed to walk in hospital room?: A Little Help needed climbing 3-5 steps with a railing? : A Lot 6 Click Score: 17    End of Session Equipment Utilized During Treatment: Oxygen Activity Tolerance: Patient tolerated treatment well Patient left: in chair;with call bell/phone within reach Nurse Communication: Mobility status PT Visit Diagnosis: Unsteadiness on feet (R26.81);Muscle weakness (generalized) (M62.81)     Time: 2080-2233 PT Time Calculation (min) (ACUTE ONLY): 31 min  Charges:  $Gait Training: 23-37 mins                     Earney Navy, PTA Acute Rehabilitation Services Pager: [phone] Office: [phone]     Darliss Cheney 12/26/2019, 11:18 AM

## 2019-12-27 LAB — GLUCOSE, CAPILLARY
Glucose-Capillary: 84 mg/dL (ref 70–99)
Glucose-Capillary: 100 mg/dL — ABNORMAL HIGH (ref 70–99)
Glucose-Capillary: 120 mg/dL — ABNORMAL HIGH (ref 70–99)
Glucose-Capillary: 112 mg/dL — ABNORMAL HIGH (ref 70–99)

## 2019-12-27 MED ORDER — FENTANYL CITRATE (PF) 100 MCG/2ML IJ SOLN
50.0000 ug | INTRAMUSCULAR | Status: DC | PRN
  Administered 2019-12-27: 50 ug via INTRAVENOUS
  Filled 2019-12-27: qty 2

## 2019-12-27 NOTE — Progress Notes (Addendum)
Dressing and pleuravac changed this morning.  Pleuravac had 1730 total out - new pleuravac hooked back up to suction. Air leak still present with cough.  Ensured chest tube was not clamped.

## 2019-12-27 NOTE — Progress Notes (Addendum)
      [address]       [place] [address]             [phone]       9 Days Post-Op Procedure(s) (LRB): VIDEO ASSISTED THORACOSCOPY (VATS)/EMPYEMA (Right)  Subjective: Patient just had a bowel movement. He has no specific complaint this am.  Objective: Vital signs in last 24 hours: Temp:  [98.2 F (36.8 C)-98.7 F (37.1 C)] 98.2 F (36.8 C) (02/13 0800) Pulse Rate:  [93-99] 99 (02/13 0800) Cardiac Rhythm: Normal sinus rhythm (02/13 0800) Resp:  [20-25] 20 (02/13 0800) BP: (124-133)/(73-92) 132/73 (02/13 0800) SpO2:  [98 %-100 %] 98 % (02/13 0800)     Intake/Output from previous day: 02/12 0701 - 02/13 0700 In: 2958.7 [P.O.:720; I.V.:2238.7] Out: 4118 [Urine:3800; Chest Tube:318]   Physical Exam:  Cardiovascular: RRR Pulmonary: Clear to auscultation on left  Abdomen: Soft, non tender, bowel sounds present. Extremities: No lower extremity edema. Wounds: Clean and dry.  No erythema or signs of infection. Chest Tube: to suction, +++ air leak  Lab Results: CBC: Recent Labs    12/25/19 0340 12/26/19 0414  WBC 9.6 10.2  HGB 8.3* 8.2*  HCT 26.5* 26.7*  PLT 491* 508*   BMET:  Recent Labs    12/25/19 0340 12/26/19 0414  NA 139 140  K 3.6 3.5  CL 103 103  CO2 29 30  GLUCOSE 105* 98  BUN 7 8  CREATININE 0.67 0.78  CALCIUM 8.2* 8.2*    PT/INR: No results for input(s): LABPROT, INR in the last 72 hours. ABG:  INR: Will add last result for INR, ABG once components are confirmed Will add last 4 CBG results once components are confirmed  Assessment/Plan:  1. CV - SR. On Lisinopril 20 mg daily. 2.  Pulmonary - On 2-3 L of oxygen via . Will wean as able. Chest tube to suction, +++ air leak. No CXR ordered for today but will order for am. On Mucinex. Encourage incentive spirometer. 3. CBGs 102/126/84. On Metformin 500 mg daily. 4. Anemia- H and H yesterday stable at 8.2 and 26.7  Donielle M ZimmermanPA-C 12/27/2019,9:27  AM [phone]  Patient down to 1 chest tube with persistent air leak We will transition off PCA to as needed IV supplemental narcotic in preparation for probable discharge with chest tube in place.  Wean suction down to 10 cm and chest x-ray tomorrow.  patient examined and medical record reviewed,agree with above note. Kathlee Nations Trigt III 12/27/2019

## 2019-12-28 ENCOUNTER — Inpatient Hospital Stay (HOSPITAL_COMMUNITY): Payer: Medicaid Other

## 2019-12-28 LAB — GLUCOSE, CAPILLARY
Glucose-Capillary: 91 mg/dL (ref 70–99)
Glucose-Capillary: 102 mg/dL — ABNORMAL HIGH (ref 70–99)
Glucose-Capillary: 130 mg/dL — ABNORMAL HIGH (ref 70–99)
Glucose-Capillary: 107 mg/dL — ABNORMAL HIGH (ref 70–99)

## 2019-12-28 MED ORDER — SALINE SPRAY 0.65 % NA SOLN
1.0000 | NASAL | Status: DC | PRN
  Filled 2019-12-28: qty 44

## 2019-12-28 MED ORDER — AMOXICILLIN-POT CLAVULANATE 875-125 MG PO TABS
1.0000 | ORAL_TABLET | Freq: Two times a day (BID) | ORAL | Status: DC
  Administered 2019-12-28 – 2020-01-07 (×21): 1 via ORAL
  Filled 2019-12-28 (×21): qty 1

## 2019-12-28 MED ORDER — GUAIFENESIN 100 MG/5ML PO SOLN
5.0000 mL | ORAL | Status: DC | PRN
  Administered 2019-12-28 – 2020-01-06 (×16): 100 mg via ORAL
  Filled 2019-12-28 (×18): qty 5

## 2019-12-28 NOTE — Progress Notes (Addendum)
      [address]       [place] [address]             [phone]       10 Days Post-Op Procedure(s) (LRB): VIDEO ASSISTED THORACOSCOPY (VATS)/EMPYEMA (Right)  Subjective: Patient without specific complaints this am. He is talking on the phone to his wife.  Objective: Vital signs in last 24 hours: Temp:  [97.6 F (36.4 C)-98.6 F (37 C)] 98.2 F (36.8 C) (02/14 0755) Pulse Rate:  [89-96] 89 (02/14 0335) Cardiac Rhythm: Sinus tachycardia (02/14 0700) Resp:  [20-28] 24 (02/14 0335) BP: (117-140)/(79-99) 117/79 (02/14 0335) SpO2:  [97 %-100 %] 100 % (02/14 0335)     Intake/Output from previous day: 02/13 0701 - 02/14 0700 In: 2075.2 [P.O.:1410; I.V.:665.2] Out: 3050 [Urine:2800; Chest Tube:250]   Physical Exam:  Cardiovascular: RRR Pulmonary: Clear to auscultation on left and audible squeak on right Abdomen: Soft, non tender, bowel sounds present. Extremities: No lower extremity edema. Wounds: Clean and dry.  No erythema or signs of infection. Chest Tube: to suction, +++ air leak  Lab Results: CBC: Recent Labs    12/26/19 0414  WBC 10.2  HGB 8.2*  HCT 26.7*  PLT 508*   BMET:  Recent Labs    12/26/19 0414  NA 140  K 3.5  CL 103  CO2 30  GLUCOSE 98  BUN 8  CREATININE 0.78  CALCIUM 8.2*    PT/INR: No results for input(s): LABPROT, INR in the last 72 hours. ABG:  INR: Will add last result for INR, ABG once components are confirmed Will add last 4 CBG results once components are confirmed  Assessment/Plan:  1. CV - SR. On Lisinopril 20 mg daily. 2.  Pulmonary - On 2-3 L of oxygen via [place].  Chest tube with 250 cc of output last 24 hours. Chest tube to suction, +++ air leak.CXR this am appears stable . Will place to water seal as may be able to transition to mini express if tolerates water seal. On Mucinex. Encourage incentive spirometer. 3. CBGs 120/112/91. On Metformin 500 mg daily. 4. Anemia- Last H and H stable at 8.2 and  26.7  Donielle M ZimmermanPA-C 12/28/2019,8:37 AM [phone]  Reduce suction to water seal and follow CXR May send home with mini Express if air leak persists and CXR stable off suction ID has rec po Augmentin now that iv Ancef has completed course- ordered  patient examined and medical record reviewed,agree with above note. Kathlee Nations Trigt III 12/28/2019

## 2019-12-28 NOTE — Progress Notes (Signed)
Physical Therapy Treatment Patient Details Name: Nathaniel Webb MRN: [id number] DOB: [birth year] Today's Date: 12/28/2019    History of Present Illness 54 y/o male w/ hx of Obesity, low HDL, HTN, DM, presented to [facility] (12/29) with approximately 1 week history of gradually worsening shortness of breath.  He was found to have acute hypoxic respiratory failure and was requiring high flow oxygen to maintain O2 saturations.  Chest x-ray was typical of Covid pneumonitis. Pt dx w/ COVID 12/23 and dc home w/ spouse. 1/10 CXR showed ARDS bilateral increasing opacification; respiratory status worsened; concerns for aspiration. Acute PE; loculated right pleural effusion; 1/11 CTS consult, recs chest tube. 1/12 CT chest with densely consolidated right lung, air fluid level/ hydropneumothorax. Thoracentesis 1/12; pleural fluid sampled and concerning for empyema.  1/13 emergently transferred to ICU for worsening hypoxia; CT showed worsening right hydropneumothorax with loculations in pleura. Emergent right chest tube placed. Pt intubated 11/30/19-12/01/19. Pt developed hematuria on 1/21. Pt underwent VATS on 2/4, intubated post-proceudre and extubated on 2/5.    PT Comments    Pt tolerated treatment well, ambulating for multiple trials without physical assistance. Pt's current greatest limitation is his respiratory status and activity tolerance, requiring frequent breaks between bouts of ambulation and activity. Pt will continue to benefit from acute PT POC to improve activity tolerance and to restore independence.   Follow Up Recommendations  Home health PT;Supervision - Intermittent     Equipment Recommendations  Rolling walker with 5" wheels;3in1 (PT)    Recommendations for Other Services       Precautions / Restrictions Precautions Precautions: Fall Precaution Comments: R chest tube, watch O2 Restrictions Weight Bearing Restrictions: No    Mobility  Bed Mobility Overal bed mobility: Modified  Independent Bed Mobility: Supine to Sit     Supine to sit: Modified independent (Device/Increase time);HOB elevated        Transfers Overall transfer level: Needs assistance Equipment used: Rolling walker (2 wheeled) Transfers: Sit to/from Stand Sit to Stand: Supervision         General transfer comment: pt performs 4 sit to stands from bed and arm chair during session  Ambulation/Gait Ambulation/Gait assistance: Supervision Gait Distance (Feet): 120 Feet(2 additional trials of 90') Assistive device: Rolling walker (2 wheeled) Gait Pattern/deviations: Step-through pattern Gait velocity: reduced Gait velocity interpretation: 1.31 - 2.62 ft/sec, indicative of limited community ambulator General Gait Details: pt with slow but steady step through gait   Stairs             Wheelchair Mobility    Modified Rankin (Stroke Patients Only)       Balance Overall balance assessment: Needs assistance Sitting-balance support: No upper extremity supported;Feet supported Sitting balance-Leahy Scale: Good     Standing balance support: Bilateral upper extremity supported Standing balance-Leahy Scale: Good Standing balance comment: supervision with BUE support of RW                            Cognition Arousal/Alertness: Awake/alert Behavior During Therapy: WFL for tasks assessed/performed Overall Cognitive Status: Within Functional Limits for tasks assessed                                        Exercises      General Comments General comments (skin integrity, edema, etc.): RR into 30s, SpO2 from 89-100% while ambulating on 3L [place]. Pt  with noted increased work of breathing during ambulation      Pertinent Vitals/Pain Pain Assessment: No/denies pain    Home Living                      Prior Function            PT Goals (current goals can now be found in the care plan section) Acute Rehab PT Goals Patient Stated Goal: to  go home Progress towards PT goals: Progressing toward goals    Frequency    Min 3X/week      PT Plan Discharge plan needs to be updated    Co-evaluation              AM-PAC PT "6 Clicks" Mobility   Outcome Measure  Help needed turning from your back to your side while in a flat bed without using bedrails?: None Help needed moving from lying on your back to sitting on the side of a flat bed without using bedrails?: None Help needed moving to and from a bed to a chair (including a wheelchair)?: None Help needed standing up from a chair using your arms (e.g., wheelchair or bedside chair)?: None Help needed to walk in hospital room?: None Help needed climbing 3-5 steps with a railing? : A Little 6 Click Score: 23    End of Session Equipment Utilized During Treatment: Oxygen Activity Tolerance: Patient tolerated treatment well Patient left: in chair;with call bell/phone within reach Nurse Communication: Mobility status PT Visit Diagnosis: Unsteadiness on feet (R26.81);Muscle weakness (generalized) (M62.81)     Time: 6222-9798 PT Time Calculation (min) (ACUTE ONLY): 23 min  Charges:  $Gait Training: 23-37 mins                     Nathaniel Webb, PT, DPT Acute Rehabilitation Pager: [phone]    Nathaniel Webb 12/28/2019, 1:26 PM

## 2019-12-28 NOTE — Progress Notes (Signed)
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 His respirations have remained elevated since his surgery on February 4th. His chest tube is still in place on room air his sats are 100%. Patient stated his pain level is elevated when he coughs due to his chest tube. He does have a minimal air leak and another chest xray will be done in the morning. Will continue to monitor this patient closely.

## 2019-12-29 ENCOUNTER — Inpatient Hospital Stay (HOSPITAL_COMMUNITY): Payer: Medicaid Other

## 2019-12-29 LAB — GLUCOSE, CAPILLARY
Glucose-Capillary: 94 mg/dL (ref 70–99)
Glucose-Capillary: 109 mg/dL — ABNORMAL HIGH (ref 70–99)
Glucose-Capillary: 112 mg/dL — ABNORMAL HIGH (ref 70–99)
Glucose-Capillary: 116 mg/dL — ABNORMAL HIGH (ref 70–99)

## 2019-12-29 NOTE — Progress Notes (Signed)
Nutrition Follow up  DOCUMENTATION CODES:   Obesity unspecified  INTERVENTION:    Continue Ensure Enlive po TID, each supplement provides 350 kcal and 20 grams of protein  Continue Magic cup TID with meals, each supplement provides 290 kcal and 9 grams of protein  MVI daily   NUTRITION DIAGNOSIS:   Inadequate oral intake related to acute illness, catabolic illness, decreased appetite as evidenced by NPO status, meal completion < 50%.  Ongoing  GOAL:   Patient will meet greater than or equal to 90% of their needs   Progressing  MONITOR:   PO intake, Supplement acceptance, Labs, Weight trends, Diet advancement  REASON FOR ASSESSMENT:   Consult, Ventilator Enteral/tube feeding initiation and management  ASSESSMENT:   54 yo male admitted with COVID 19 pneumonia, developed worsening pneumothorax requiring chest tube, brief intubation.   PMH includes DM, HTN, morbid obesity  12/29 Admit 01/13 Worsening pneumothorax, chest tube placed 01/17 Intubated 01/18 Extubated 2/4 s/p VATS for drainage empyema, removal of right pleural space hematoma 2/5 extubated, diet advanced   Pt reports having great appetite. Meal completions charted as 40-100% for his last eight meals (78% average). Denies complication with food texture despite earlier reports of poor dentition. Drinking Ensure BID and would like to continue.   Admission weight: 95.5 kg  Current weight: 93.7 kg   I/O: -6,708 ml since 2/1 UOP: 3,300 ml x 24 hrs  Chest tubes: 130 ml x 24 hrs   Medications: dulcolax, SS novolog, metformin, MVI with minerals, senokot Labs: CBG 91-120  Diet Order:   Diet Order            Diet heart healthy/carb modified Room service appropriate? Yes; Fluid consistency: Thin  Diet effective now              EDUCATION NEEDS:   Not appropriate for education at this time  Skin:  Skin Assessment: Skin Integrity Issues: Skin Integrity Issues:: Other (Comment), Incisions Incisions:  R chest Other: skin tear buttocks  Last BM:  2/13  Height:   Ht Readings from Last 1 Encounters:  11/11/19 5\' 5"  (1.651 m)    Weight:   Wt Readings from Last 1 Encounters:  12/26/19 93.7 kg    Ideal Body Weight:  61.8 kg  BMI:  Body mass index is 34.38 kg/m.  Estimated Nutritional Needs:   Kcal:  2325-2780 kcals  Protein:  135-160 g  Fluid:  >/= 2.2 L  03-29-2006 RD, LDN Clinical Nutrition Pager # [phone]

## 2019-12-29 NOTE — Progress Notes (Signed)
Physical Therapy Treatment Patient Details Name: Nathaniel Webb MRN: [id number] DOB: 1966-10-30 Today's Date: 12/29/2019    History of Present Illness 54 y/o male w/ hx of Obesity, low HDL, HTN, DM, presented to [facility] (12/29) with approximately 1 week history of gradually worsening shortness of breath.  He was found to have acute hypoxic respiratory failure and was requiring high flow oxygen to maintain O2 saturations.  Chest x-ray was typical of Covid pneumonitis. Pt dx w/ COVID 12/23 and dc home w/ spouse. 1/10 CXR showed ARDS bilateral increasing opacification; respiratory status worsened; concerns for aspiration. Acute PE; loculated right pleural effusion; 1/11 CTS consult, recs chest tube. 1/12 CT chest with densely consolidated right lung, air fluid level/ hydropneumothorax. Thoracentesis 1/12; pleural fluid sampled and concerning for empyema.  1/13 emergently transferred to ICU for worsening hypoxia; CT showed worsening right hydropneumothorax with loculations in pleura. Emergent right chest tube placed. Pt intubated 11/30/19-12/01/19. Pt developed hematuria on 1/21. Pt underwent VATS on 2/4, intubated post-proceudre and extubated on 2/5.    PT Comments    Patient progressing with ambulation distance and pushing despite some coughing during session and bothered by stuffy nose with breathing.  Reports likely home end of week or weekend.  Will continue towards goals and continue to recommend HHPT follow up at d/c.    Follow Up Recommendations  Home health PT;Supervision - Intermittent     Equipment Recommendations  Rolling walker with 5" wheels;3in1 (PT)    Recommendations for Other Services       Precautions / Restrictions Precautions Precautions: Fall Precaution Comments: R chest tube, watch O2    Mobility  Bed Mobility               General bed mobility comments: in chair  Transfers Overall transfer level: Needs assistance Equipment used: Rolling walker (2  wheeled) Transfers: Sit to/from Stand Sit to Stand: Supervision         General transfer comment: for lines/tubes  Ambulation/Gait Ambulation/Gait assistance: Supervision Gait Distance (Feet): 200 Feet(x 2) Assistive device: Rolling walker (2 wheeled) Gait Pattern/deviations: Step-through pattern;Decreased stride length;Trunk flexed     General Gait Details: coughing at times during ambulation today, but determined to keep pushing through, states stuffy nose as well contributing to difficulty breathing, SpO2 throughout 98-100% on 2-3L O2   Stairs             Wheelchair Mobility    Modified Rankin (Stroke Patients Only)       Balance Overall balance assessment: Needs assistance Sitting-balance support: No upper extremity supported;Feet supported Sitting balance-Leahy Scale: Good     Standing balance support: Bilateral upper extremity supported Standing balance-Leahy Scale: Poor Standing balance comment: supervision with BUE support of RW                            Cognition Arousal/Alertness: Awake/alert Behavior During Therapy: WFL for tasks assessed/performed Overall Cognitive Status: Within Functional Limits for tasks assessed                                        Exercises      General Comments        Pertinent Vitals/Pain Faces Pain Scale: Hurts a little bit Pain Location: chest Pain Descriptors / Indicators: Discomfort    Home Living  Prior Function            PT Goals (current goals can now be found in the care plan section) Acute Rehab PT Goals Patient Stated Goal: to go home PT Goal Formulation: With patient Time For Goal Achievement: 01/12/20 Potential to Achieve Goals: Good Progress towards PT goals: Progressing toward goals    Frequency    Min 3X/week      PT Plan Current plan remains appropriate    Co-evaluation              AM-PAC PT "6 Clicks"  Mobility   Outcome Measure  Help needed turning from your back to your side while in a flat bed without using bedrails?: None Help needed moving from lying on your back to sitting on the side of a flat bed without using bedrails?: None Help needed moving to and from a bed to a chair (including a wheelchair)?: None Help needed standing up from a chair using your arms (e.g., wheelchair or bedside chair)?: None Help needed to walk in hospital room?: A Little Help needed climbing 3-5 steps with a railing? : A Little 6 Click Score: 22    End of Session Equipment Utilized During Treatment: Oxygen Activity Tolerance: Patient tolerated treatment well Patient left: in chair;with call bell/phone within reach   PT Visit Diagnosis: Unsteadiness on feet (R26.81);Muscle weakness (generalized) (M62.81)     Time: 3009-2330 PT Time Calculation (min) (ACUTE ONLY): 23 min  Charges:  $Gait Training: 23-37 mins                     Nathaniel Webb [phone] 12/29/2019    Nathaniel Webb 12/29/2019, 5:36 PM

## 2019-12-29 NOTE — Progress Notes (Addendum)
[address]       [place] [address]             [phone]      11 Days Post-Op Procedure(s) (LRB): VIDEO ASSISTED THORACOSCOPY (VATS)/EMPYEMA (Right) Subjective: Feels ok  Objective: Vital signs in last 24 hours: Temp:  [98.1 F (36.7 C)-98.3 F (36.8 C)] 98.1 F (36.7 C) (02/14 2307) Pulse Rate:  [84-120] 87 (02/15 0400) Cardiac Rhythm: Normal sinus rhythm (02/15 0400) Resp:  [20-33] 21 (02/15 0400) BP: (113-122)/(76-83) 122/83 (02/14 2307) SpO2:  [96 %-100 %] 99 % (02/15 0400)  Hemodynamic parameters for last 24 hours:    Intake/Output from previous day: 02/14 0701 - 02/15 0700 In: 840 [P.O.:840] Out: 3430 [Urine:3300; Chest Tube:130] Intake/Output this shift: No intake/output data recorded.  General appearance: alert, cooperative and no distress Heart: regular rate and rhythm Lungs: clear to auscultation bilaterally Abdomen: benign Extremities: no edema or calf tenderness Wound: incis healing well  Lab Results: No results for input(s): WBC, HGB, HCT, PLT in the last 72 hours. BMET: No results for input(s): NA, K, CL, CO2, GLUCOSE, BUN, CREATININE, CALCIUM in the last 72 hours.  PT/INR: No results for input(s): LABPROT, INR in the last 72 hours. ABG    Component Value Date/Time   PHART 7.442 12/19/2019 1049   HCO3 25.0 12/19/2019 1049   TCO2 26 12/19/2019 1049   ACIDBASEDEF 4.1 (H) 11/09/2019 2210   O2SAT 98.0 12/19/2019 1049   CBG (last 3)  Recent Labs    12/28/19 1605 12/28/19 2126 12/29/19 0630  GLUCAP 130* 107* 94    Meds Scheduled Meds: . amoxicillin-clavulanate  1 tablet Oral Q12H  . atorvastatin  10 mg Oral QPM  . bisacodyl  10 mg Oral Daily  . Chlorhexidine Gluconate Cloth  6 each Topical Daily  . enoxaparin (LOVENOX) injection  40 mg Subcutaneous Q24H  . feeding supplement (ENSURE ENLIVE)  237 mL Oral TID BM  . guaiFENesin  600 mg Oral BID  . insulin aspart  0-24 Units Subcutaneous TID AC & HS  .  lisinopril  20 mg Oral Daily  . mouth rinse  15 mL Mouth Rinse BID  . metFORMIN  500 mg Oral Q breakfast  . multivitamin with minerals  1 tablet Oral Daily  . pantoprazole  40 mg Oral Q1200  . senna-docusate  1 tablet Oral QHS  . sodium chloride flush  10-40 mL Intracatheter Q12H   Continuous Infusions: PRN Meds:.fentaNYL (SUBLIMAZE) injection, guaiFENesin, influenza vac split quadrivalent PF, levalbuterol, ondansetron (ZOFRAN) IV, oxyCODONE, sodium chloride, sodium chloride flush, traMADol  Xrays DG CHEST PORT 1 VIEW  Result Date: 12/29/2019 CLINICAL DATA:  Post right-sided chest tube placement. History of empyema and COVID-19 infection. EXAM: PORTABLE CHEST 1 VIEW COMPARISON:  12/28/2019; 12/26/2019; 12/25/2019; 12/24/2019 FINDINGS: Grossly unchanged cardiac silhouette and mediastinal contours. Stable position of support apparatus with unchanged moderate-sized right-sided pneumothorax. The amount of right lateral chest wall subcutaneous emphysema is unchanged to slightly reduced in the interval. Grossly unchanged rather extensive interstitial thickening. Bibasilar and perihilar opacities are unchanged. No new focal airspace opacities. No definite evidence of edema. Known right-sided rib fractures not well appreciated on the present examination. IMPRESSION: 1. Stable positioning of right-sided chest tube with unchanged moderate-sized right-sided pneumothorax. 2. Stable appearance of lungs with rather extensive bilateral interstitial thickening, perihilar and bibasilar opacities, atelectasis versus infiltrate. Electronically Signed   By: Simonne Come M.D.   On: 12/29/2019 07:10   DG Chest [facility]  1 View  Result Date: 12/28/2019 CLINICAL DATA:  Follow-up right chest tube EXAM: PORTABLE CHEST 1 VIEW COMPARISON:  01/03/2020 FINDINGS: Cardiac shadow is stable. Right chest tube is again seen. Pleural thickening is noted and stable in appearance. There are changes suggestive of a right basilar pneumothorax.  Subcutaneous emphysema is again seen. Left lung is clear. Known rib fractures are not as well appreciated on today's exam. IMPRESSION: Tubes and lines as described stable in appearance. There is suggestion of a small right basilar pneumothorax. Diffuse pleural thickening remains on the right. Electronically Signed   By: Inez Catalina M.D.   On: 12/28/2019 09:20    Assessment/Plan: S/P Procedure(s) (LRB): VIDEO ASSISTED THORACOSCOPY (VATS)/EMPYEMA (Right)  1 hemodyn stable in sinus rhythm 2 sats good on 2.5 liters  3 CXR - slightly bigger space 4 small air leak  5 will d/w MD   LOS: 48 days    John Giovanni PA-C 12/29/2019 Pager [phone]  Sub-pulmonic space increased off suction Place back on 10 cm suction and try water seal again In 48 hrs [wed]. Wean off O2 before DC  patient examined and medical record reviewed,agree with above note. Tharon Aquas Trigt III 12/29/2019

## 2019-12-30 ENCOUNTER — Inpatient Hospital Stay (HOSPITAL_COMMUNITY): Payer: Medicaid Other

## 2019-12-30 LAB — GLUCOSE, CAPILLARY
Glucose-Capillary: 103 mg/dL — ABNORMAL HIGH (ref 70–99)
Glucose-Capillary: 107 mg/dL — ABNORMAL HIGH (ref 70–99)
Glucose-Capillary: 94 mg/dL (ref 70–99)

## 2019-12-30 NOTE — Progress Notes (Signed)
Physical Therapy Treatment Patient Details Name: Nathaniel Webb MRN: [id number] DOB: 1966-09-06 Today's Date: 12/30/2019    History of Present Illness Pt is a 54 y.o. male who tested (+) COVID-19 on 11/04/20, admitted 11/10/20 with worsening SOB, found to have acute hypoxic respiratory failure secondary to COVID pneumonitis. 1/10 CXR with ARDS, pt with acute PE and loculated R pleural effusion, concern for aspiration. 1/11 required chest tube placement. 1/12 chest CT with hydropneumothorax; s/p thoracentesis 1/12 with fluid sample concerning for empyema. 1/13 transfer to ICU for worsening hypoxia; additional chest tube placed. ETT 1/17-1/18. 1/21 developed hematuria. S/p VATS 2/4; intubated post-procedure and extubated 2/5. PMH includes HTN, DM.   PT Comments    Pt continues to progress well with mobility. Trialled gait training with rollator this session for added energy conservation, pt would like to d/c home with this instead of RW. Unable to get reliable pulse ox read with ambulation; switched to ear probe and pt maintaining >/98% on RA at rest. Educ re: seated/standing therex, activity recommendations, energy conservation. Will continue to follow acutely.   Follow Up Recommendations  Home health PT;Supervision - Intermittent     Equipment Recommendations  3in1 (PT);Other (rollator)    Recommendations for Other Services       Precautions / Restrictions Precautions Precautions: Fall Precaution Comments: R chest tube, watch O2 Restrictions Weight Bearing Restrictions: No    Mobility  Bed Mobility               General bed mobility comments: Received sitting in recliner  Transfers Overall transfer level: Independent Equipment used: None;4-wheeled walker             General transfer comment: initial stand to rollator, cues for locking brakes, pt mod indep. Additional repeated sit<>stands, pt independent able to progress to no UE support to  stand  Ambulation/Gait Ambulation/Gait assistance: Supervision Gait Distance (Feet): 200 Feet Assistive device: 4-wheeled walker Gait Pattern/deviations: Step-through pattern;Decreased stride length;Trunk flexed   Gait velocity interpretation: 1.31 - 2.62 ft/sec, indicative of limited community ambulator General Gait Details: Slow, steady gait with rollator, supervision for assist with lines and to monitor O2. Intermittent standing rest breaks to cough and catch breath. Unable to get reliable pulse ox read with ambulation; switched to ear probe in room and reading 100% on 2-3L (1x extension tubing)   Stairs             Wheelchair Mobility    Modified Rankin (Stroke Patients Only)       Balance Overall balance assessment: Needs assistance Sitting-balance support: No upper extremity supported;Feet supported Sitting balance-Leahy Scale: Good       Standing balance-Leahy Scale: Fair Standing balance comment: Can static stand and take steps without UE support                            Cognition Arousal/Alertness: Awake/alert Behavior During Therapy: WFL for tasks assessed/performed Overall Cognitive Status: Within Functional Limits for tasks assessed                                        Exercises Other Exercises Other Exercises: Repeated sit<>stands without DME and without UE support Other Exercises: Incentive spirometer - good technique without cues, but pulling less mL than earlier after walk    General Comments        Pertinent Vitals/Pain Pain  Assessment: Faces Faces Pain Scale: Hurts a little bit Pain Location: Chest tube insertion site with coughing Pain Descriptors / Indicators: Discomfort Pain Intervention(s): Monitored during session    Home Living                      Prior Function            PT Goals (current goals can now be found in the care plan section) Progress towards PT goals: Progressing toward  goals    Frequency    Min 3X/week      PT Plan Current plan remains appropriate    Co-evaluation              AM-PAC PT "6 Clicks" Mobility   Outcome Measure  Help needed turning from your back to your side while in a flat bed without using bedrails?: None Help needed moving from lying on your back to sitting on the side of a flat bed without using bedrails?: None Help needed moving to and from a bed to a chair (including a wheelchair)?: None Help needed standing up from a chair using your arms (e.g., wheelchair or bedside chair)?: None Help needed to walk in hospital room?: A Little Help needed climbing 3-5 steps with a railing? : A Little 6 Click Score: 22    End of Session Equipment Utilized During Treatment: Oxygen Activity Tolerance: Patient tolerated treatment well Patient left: in chair;with call bell/phone within reach Nurse Communication: Mobility status PT Visit Diagnosis: Unsteadiness on feet (R26.81);Muscle weakness (generalized) (M62.81)     Time: 3159-4585 PT Time Calculation (min) (ACUTE ONLY): 24 min  Charges:  $Gait Training: 8-22 mins $Therapeutic Exercise: 8-22 mins                    Ina Homes, PT, DPT Acute Rehabilitation Services  Pager [phone] Office [phone]  Malachy Chamber 12/30/2019, 4:23 PM

## 2019-12-30 NOTE — Progress Notes (Addendum)
[address]       [place] [address]             [phone]      12 Days Post-Op Procedure(s) (LRB): VIDEO ASSISTED THORACOSCOPY (VATS)/EMPYEMA (Right) Subjective: Feels ok, no new C/O CXR remains stable in appearance  Objective: Vital signs in last 24 hours: Temp:  [97.7 F (36.5 C)-98.6 F (37 C)] 98.3 F (36.8 C) (02/16 0359) Pulse Rate:  [89-113] 89 (02/16 0359) Cardiac Rhythm: Normal sinus rhythm (02/15 2028) Resp:  [16-33] 20 (02/16 0359) BP: (99-117)/(71-79) 99/75 (02/16 0359) SpO2:  [98 %-100 %] 100 % (02/16 0359)  Hemodynamic parameters for last 24 hours:    Intake/Output from previous day: 02/15 0701 - 02/16 0700 In: -  Out: 1195 [Urine:1025; Chest Tube:170] Intake/Output this shift: No intake/output data recorded.  General appearance: alert, cooperative and no distress Heart: regular rate and rhythm Lungs: coarse BS on right, left clear Abdomen: benign Extremities: no edema or calf tenderness Wound: incis healing well  Lab Results: No results for input(s): WBC, HGB, HCT, PLT in the last 72 hours. BMET: No results for input(s): NA, K, CL, CO2, GLUCOSE, BUN, CREATININE, CALCIUM in the last 72 hours.  PT/INR: No results for input(s): LABPROT, INR in the last 72 hours. ABG    Component Value Date/Time   PHART 7.442 12/19/2019 1049   HCO3 25.0 12/19/2019 1049   TCO2 26 12/19/2019 1049   ACIDBASEDEF 4.1 (H) 11/09/2019 2210   O2SAT 98.0 12/19/2019 1049   CBG (last 3)  Recent Labs    12/29/19 1704 12/29/19 2114 12/30/19 0612  GLUCAP 112* 116* 103*    Meds Scheduled Meds: . amoxicillin-clavulanate  1 tablet Oral Q12H  . atorvastatin  10 mg Oral QPM  . bisacodyl  10 mg Oral Daily  . Chlorhexidine Gluconate Cloth  6 each Topical Daily  . enoxaparin (LOVENOX) injection  40 mg Subcutaneous Q24H  . feeding supplement (ENSURE ENLIVE)  237 mL Oral TID BM  . guaiFENesin  600 mg Oral BID  . insulin aspart  0-24 Units  Subcutaneous TID AC & HS  . lisinopril  20 mg Oral Daily  . mouth rinse  15 mL Mouth Rinse BID  . metFORMIN  500 mg Oral Q breakfast  . multivitamin with minerals  1 tablet Oral Daily  . pantoprazole  40 mg Oral Q1200  . senna-docusate  1 tablet Oral QHS  . sodium chloride flush  10-40 mL Intracatheter Q12H   Continuous Infusions: PRN Meds:.fentaNYL (SUBLIMAZE) injection, guaiFENesin, influenza vac split quadrivalent PF, levalbuterol, ondansetron (ZOFRAN) IV, oxyCODONE, sodium chloride, sodium chloride flush, traMADol  Xrays DG CHEST PORT 1 VIEW  Result Date: 12/29/2019 CLINICAL DATA:  Post right-sided chest tube placement. History of empyema and COVID-19 infection. EXAM: PORTABLE CHEST 1 VIEW COMPARISON:  12/28/2019; 12/26/2019; 12/25/2019; 12/24/2019 FINDINGS: Grossly unchanged cardiac silhouette and mediastinal contours. Stable position of support apparatus with unchanged moderate-sized right-sided pneumothorax. The amount of right lateral chest wall subcutaneous emphysema is unchanged to slightly reduced in the interval. Grossly unchanged rather extensive interstitial thickening. Bibasilar and perihilar opacities are unchanged. No new focal airspace opacities. No definite evidence of edema. Known right-sided rib fractures not well appreciated on the present examination. IMPRESSION: 1. Stable positioning of right-sided chest tube with unchanged moderate-sized right-sided pneumothorax. 2. Stable appearance of lungs with rather extensive bilateral interstitial thickening, perihilar and bibasilar opacities, atelectasis versus infiltrate. Electronically Signed   By: Holland Commons.D.  On: 12/29/2019 07:10    Assessment/Plan: S/P Procedure(s) (LRB): VIDEO ASSISTED THORACOSCOPY (VATS)/EMPYEMA (Right)  1 hemodyn stable in sinus rhythm/tachy 2 sats good on 3 liters, cont current RX 3 CXR stable sub-pulmonic space- cont CT to 10 cm H2O suction 4 BS controlled 5 no new labs    LOS: 49 days     John Giovanni PA-C 12/30/2019 Pager [phone]  CXR personally viewed patient examined and medical record reviewed,agree with above note. Tharon Aquas Trigt III 12/30/2019

## 2019-12-31 ENCOUNTER — Inpatient Hospital Stay (HOSPITAL_COMMUNITY): Payer: Medicaid Other

## 2019-12-31 LAB — GLUCOSE, CAPILLARY
Glucose-Capillary: 110 mg/dL — ABNORMAL HIGH (ref 70–99)
Glucose-Capillary: 107 mg/dL — ABNORMAL HIGH (ref 70–99)
Glucose-Capillary: 121 mg/dL — ABNORMAL HIGH (ref 70–99)
Glucose-Capillary: 142 mg/dL — ABNORMAL HIGH (ref 70–99)
Glucose-Capillary: 99 mg/dL (ref 70–99)

## 2019-12-31 NOTE — Progress Notes (Addendum)
[address]       [place] [address]             [phone]      13 Days Post-Op Procedure(s) (LRB): VIDEO ASSISTED THORACOSCOPY (VATS)/EMPYEMA (Right) Subjective: Feels pretty well, + non-productive cough  Objective: Vital signs in last 24 hours: Temp:  [97.8 F (36.6 C)-98.5 F (36.9 C)] 98 F (36.7 C) (02/17 0737) Pulse Rate:  [64-108] 106 (02/17 0737) Cardiac Rhythm: Normal sinus rhythm (02/17 0400) Resp:  [17-31] 20 (02/17 0737) BP: (101-118)/(74-86) 114/86 (02/17 0737) SpO2:  [91 %-100 %] 93 % (02/17 0737)  Hemodynamic parameters for last 24 hours:    Intake/Output from previous day: 02/16 0701 - 02/17 0700 In: 608 [P.O.:605; I.V.:3] Out: 1920 [Urine:1850; Chest Tube:70] Intake/Output this shift: No intake/output data recorded.  General appearance: alert, cooperative and no distress Heart: regular rate and rhythm Lungs: coarse and dim on right , clear on left Abdomen: benign Extremities: no calf tenderness Wound: incis healing well  Lab Results: No results for input(s): WBC, HGB, HCT, PLT in the last 72 hours. BMET: No results for input(s): NA, K, CL, CO2, GLUCOSE, BUN, CREATININE, CALCIUM in the last 72 hours.  PT/INR: No results for input(s): LABPROT, INR in the last 72 hours. ABG    Component Value Date/Time   PHART 7.442 12/19/2019 1049   HCO3 25.0 12/19/2019 1049   TCO2 26 12/19/2019 1049   ACIDBASEDEF 4.1 (H) 11/09/2019 2210   O2SAT 98.0 12/19/2019 1049   CBG (last 3)  Recent Labs    12/30/19 1127 12/30/19 2117 12/31/19 0616  GLUCAP 107* 94 107*    Meds Scheduled Meds: . amoxicillin-clavulanate  1 tablet Oral Q12H  . atorvastatin  10 mg Oral QPM  . bisacodyl  10 mg Oral Daily  . Chlorhexidine Gluconate Cloth  6 each Topical Daily  . enoxaparin (LOVENOX) injection  40 mg Subcutaneous Q24H  . feeding supplement (ENSURE ENLIVE)  237 mL Oral TID BM  . guaiFENesin  600 mg Oral BID  . insulin aspart  0-24 Units  Subcutaneous TID AC & HS  . lisinopril  20 mg Oral Daily  . mouth rinse  15 mL Mouth Rinse BID  . metFORMIN  500 mg Oral Q breakfast  . multivitamin with minerals  1 tablet Oral Daily  . pantoprazole  40 mg Oral Q1200  . senna-docusate  1 tablet Oral QHS  . sodium chloride flush  10-40 mL Intracatheter Q12H   Continuous Infusions: PRN Meds:.fentaNYL (SUBLIMAZE) injection, guaiFENesin, influenza vac split quadrivalent PF, levalbuterol, ondansetron (ZOFRAN) IV, oxyCODONE, sodium chloride, sodium chloride flush, traMADol  Xrays DG Chest Port 1 View  Result Date: 12/30/2019 CLINICAL DATA:  Shortness of breath EXAM: PORTABLE CHEST 1 VIEW COMPARISON:  12/29/2019 FINDINGS: Right chest tube remains in place, unchanged. Stable lateral and basilar right pneumothorax and subcutaneous emphysema. Patchy airspace disease throughout the right lung. No confluent opacity on the left. Heart is upper limits normal in size. IMPRESSION: Stable right pneumothorax and airspace disease throughout the right lung. Electronically Signed   By: Rolm Baptise M.D.   On: 12/30/2019 08:45    Assessment/Plan: S/P Procedure(s) (LRB): VIDEO ASSISTED THORACOSCOPY (VATS)/EMPYEMA (Right)  1 hemodyn stable with some low 100's tachycardia 2 sats ok- mostly on RA at this point with occas O2 use 3 large air leak- CXR pending, poss decrease suction today 4 BS ok 5 no other new labs 6 cont routine rehab and pulm toilet/RX  LOS: 50 days    Rowe Clack [facility] 12/31/2019  Pager [phone]  Chest tube placed to waterseal Patient now O2 sat 95% on room air Patient has no transportation from home in [facility] back to [place] for outpatient management of chest tube We will plan on talc application to chest tube once stable back on waterseal to treat air leak.  patient examined and medical record reviewed,agree with above note. Kathlee Nations Trigt III 12/31/2019

## 2019-12-31 NOTE — Progress Notes (Signed)
MD placed CT to water seal during his round this AM, no orders placed, Pt remained on water seal. During handoff, night RN advised.

## 2019-12-31 NOTE — Plan of Care (Signed)

## 2020-01-01 ENCOUNTER — Inpatient Hospital Stay (HOSPITAL_COMMUNITY): Payer: Medicaid Other

## 2020-01-01 LAB — GLUCOSE, CAPILLARY
Glucose-Capillary: 108 mg/dL — ABNORMAL HIGH (ref 70–99)
Glucose-Capillary: 136 mg/dL — ABNORMAL HIGH (ref 70–99)
Glucose-Capillary: 159 mg/dL — ABNORMAL HIGH (ref 70–99)
Glucose-Capillary: 105 mg/dL — ABNORMAL HIGH (ref 70–99)

## 2020-01-01 NOTE — Progress Notes (Signed)
Physical Therapy Treatment Patient Details Name: Nathaniel Webb MRN: [id number] DOB: 09/24/1966 Today's Date: 01/01/2020    History of Present Illness Pt is a 54 y.o. male who tested (+) COVID-19 on 11/04/20, admitted 11/10/20 with worsening SOB, found to have acute hypoxic respiratory failure secondary to COVID pneumonitis. 1/10 CXR with ARDS, pt with acute PE and loculated R pleural effusion, concern for aspiration. 1/11 required chest tube placement. 1/12 chest CT with hydropneumothorax; s/p thoracentesis 1/12 with fluid sample concerning for empyema. 1/13 transfer to ICU for worsening hypoxia; additional chest tube placed. ETT 1/17-1/18. 1/21 developed hematuria. S/p VATS 2/4; intubated post-procedure and extubated 2/5. PMH includes HTN, DM.    PT Comments    Pt tolerated treatment well with reduced oxygen requirements at rest and improved ambulation distances. Pt does desaturate during ambulation, needing 3-4L [place] to stabilize sats during activity. Pt will benefit from continued aggressive mobilization to improve activity tolerance and pulmonary function in order to return to independent mobility.   Follow Up Recommendations  Home health PT;Supervision - Intermittent     Equipment Recommendations  3in1 (PT);Other (comment)(4 wheeled walker, rollator)    Recommendations for Other Services       Precautions / Restrictions Precautions Precautions: Fall Precaution Comments: R chest tube, watch O2 Restrictions Weight Bearing Restrictions: No    Mobility  Bed Mobility Overal bed mobility: Modified Independent Bed Mobility: Supine to Sit Rolling: Modified independent (Device/Increase time)            Transfers Overall transfer level: Needs assistance Equipment used: Rolling walker (2 wheeled) Transfers: Sit to/from Stand Sit to Stand: Supervision            Ambulation/Gait Ambulation/Gait assistance: Supervision Gait Distance (Feet): 200 Feet(additional trial of  80') Assistive device: Rolling walker (2 wheeled) Gait Pattern/deviations: Step-through pattern Gait velocity: functional Gait velocity interpretation: 1.31 - 2.62 ft/sec, indicative of limited community ambulator General Gait Details: pt with steady step through gait, requiring one standing rest break to alleviate coughing.   Stairs             Wheelchair Mobility    Modified Rankin (Stroke Patients Only)       Balance Overall balance assessment: Needs assistance Sitting-balance support: No upper extremity supported;Feet supported Sitting balance-Leahy Scale: Good Sitting balance - Comments: modI, dons sock at edge of bed   Standing balance support: Bilateral upper extremity supported Standing balance-Leahy Scale: Good Standing balance comment: supervision                            Cognition Arousal/Alertness: Awake/alert Behavior During Therapy: WFL for tasks assessed/performed Overall Cognitive Status: Within Functional Limits for tasks assessed                                        Exercises      General Comments General comments (skin integrity, edema, etc.): pt on RA at rest, desats to 82% on RA during ambulation, desats to 86% on 2L[place] during ambulation and desats to 88% on 3L [place] with ambulation. Upon completion of walk pt sats in mid 90s on room air.      Pertinent Vitals/Pain Pain Assessment: Faces Faces Pain Scale: Hurts little more Pain Location: chest tube site Pain Descriptors / Indicators: Discomfort Pain Intervention(s): Limited activity within patient's tolerance    Home Living  Prior Function            PT Goals (current goals can now be found in the care plan section) Acute Rehab PT Goals Patient Stated Goal: to go home Progress towards PT goals: Progressing toward goals    Frequency    Min 3X/week      PT Plan Current plan remains appropriate    Co-evaluation               AM-PAC PT "6 Clicks" Mobility   Outcome Measure  Help needed turning from your back to your side while in a flat bed without using bedrails?: None Help needed moving from lying on your back to sitting on the side of a flat bed without using bedrails?: None Help needed moving to and from a bed to a chair (including a wheelchair)?: None Help needed standing up from a chair using your arms (e.g., wheelchair or bedside chair)?: None Help needed to walk in hospital room?: None Help needed climbing 3-5 steps with a railing? : A Little 6 Click Score: 23    End of Session Equipment Utilized During Treatment: Oxygen Activity Tolerance: Patient tolerated treatment well Patient left: in chair;with call bell/phone within reach Nurse Communication: Mobility status PT Visit Diagnosis: Unsteadiness on feet (R26.81);Muscle weakness (generalized) (M62.81)     Time: 0160-1093 PT Time Calculation (min) (ACUTE ONLY): 23 min  Charges:  $Gait Training: 23-37 mins                     Arlyss Gandy, PT, DPT Acute Rehabilitation Pager: [phone]    Arlyss Gandy 01/01/2020, 12:23 PM

## 2020-01-01 NOTE — Progress Notes (Addendum)
[address]       [place] [address]             [phone]      14 Days Post-Op Procedure(s) (LRB): VIDEO ASSISTED THORACOSCOPY (VATS)/EMPYEMA (Right) Subjective: No new c/o , overall feels pretty well  Objective: Vital signs in last 24 hours: Temp:  [97.8 F (36.6 C)-99.1 F (37.3 C)] 97.8 F (36.6 C) (02/18 0655) Pulse Rate:  [98-118] 112 (02/18 0655) Cardiac Rhythm: Sinus tachycardia (02/18 0703) Resp:  [16-22] 16 (02/18 0655) BP: (103-120)/(74-91) 120/82 (02/18 0655) SpO2:  [93 %-99 %] 93 % (02/18 0655)  Hemodynamic parameters for last 24 hours:    Intake/Output from previous day: 02/17 0701 - 02/18 0700 In: 10 [I.V.:10] Out: 1120 [Urine:900; Chest Tube:220] Intake/Output this shift: Total I/O In: 240 [P.O.:240] Out: -   General appearance: alert, cooperative and no distress Heart: regular rate and rhythm Lungs: dim on right, left is clear Abdomen: benign Extremities: no edema Wound: incis healing well  Lab Results: No results for input(s): WBC, HGB, HCT, PLT in the last 72 hours. BMET: No results for input(s): NA, K, CL, CO2, GLUCOSE, BUN, CREATININE, CALCIUM in the last 72 hours.  PT/INR: No results for input(s): LABPROT, INR in the last 72 hours. ABG    Component Value Date/Time   PHART 7.442 12/19/2019 1049   HCO3 25.0 12/19/2019 1049   TCO2 26 12/19/2019 1049   ACIDBASEDEF 4.1 (H) 11/09/2019 2210   O2SAT 98.0 12/19/2019 1049   CBG (last 3)  Recent Labs    12/31/19 1651 12/31/19 2133 01/01/20 0618  GLUCAP 142* 99 108*    Meds Scheduled Meds: . amoxicillin-clavulanate  1 tablet Oral Q12H  . atorvastatin  10 mg Oral QPM  . bisacodyl  10 mg Oral Daily  . Chlorhexidine Gluconate Cloth  6 each Topical Daily  . enoxaparin (LOVENOX) injection  40 mg Subcutaneous Q24H  . feeding supplement (ENSURE ENLIVE)  237 mL Oral TID BM  . guaiFENesin  600 mg Oral BID  . insulin aspart  0-24 Units Subcutaneous TID AC & HS  .  lisinopril  20 mg Oral Daily  . mouth rinse  15 mL Mouth Rinse BID  . metFORMIN  500 mg Oral Q breakfast  . multivitamin with minerals  1 tablet Oral Daily  . pantoprazole  40 mg Oral Q1200  . senna-docusate  1 tablet Oral QHS  . sodium chloride flush  10-40 mL Intracatheter Q12H   Continuous Infusions: PRN Meds:.fentaNYL (SUBLIMAZE) injection, guaiFENesin, influenza vac split quadrivalent PF, levalbuterol, ondansetron (ZOFRAN) IV, oxyCODONE, sodium chloride, sodium chloride flush, traMADol  Xrays DG CHEST PORT 1 VIEW  Result Date: 01/01/2020 CLINICAL DATA:  Empyema of lung EXAM: PORTABLE CHEST 1 VIEW COMPARISON:  Yesterday FINDINGS: Right chest tube with stable positioning. Increased and moderate basal and lateral pneumothorax. There is extensive pleural thickening with volume loss on the right. Relatively clear left lung. Normal heart size. IMPRESSION: 1. Increased lateral and basal pneumothorax on the right when compared to yesterday. 2. Otherwise stable. Electronically Signed   By: Marnee Spring M.D.   On: 01/01/2020 07:30   DG CHEST PORT 1 VIEW  Result Date: 12/31/2019 CLINICAL DATA:  Chest tube, cough and shortness of breath. Right-sided pleural space hematoma evacuation and decortication. EXAM: PORTABLE CHEST 1 VIEW COMPARISON:  12/30/2019 and CT chest 12/07/2019. FINDINGS: Trachea is midline. Heart size stable. Right chest tube remains in place near the apex of the  right hemithorax. A rind of pleural fluid is seen in the lateral right hemithorax with air at the base of the right hemithorax, unchanged from yesterday's exam. Streaky densities in the mid and lower right lung zone. Left lung is clear. No definite left pleural fluid. Subcutaneous emphysema along the right neck and right chest wall. IMPRESSION: 1. Stable small loculated right hydropneumothorax with right chest tube in place. 2. Streaky atelectasis in the right perihilar region and right lung base. Electronically Signed   By:  Lorin Picket M.D.   On: 12/31/2019 09:42    Assessment/Plan: S/P Procedure(s) (LRB): VIDEO ASSISTED THORACOSCOPY (VATS)/EMPYEMA (Right)  1 conts with air leak, now on H20 seal 2 hemodyn stable with sinus tachy, sats good on RA 3 CXR with somewhat bigger space, may need to place back on suction but tolerating well, poss talc slurry to be placed when more stable picture.   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 Keep on water seal Needs tube for air leak. Will inject talc tomorrow for air leak. Going home with tube in place will be a challenge due to transportation issues.  patient examined and medical record reviewed,agree with above note. Tharon Aquas Trigt III 01/01/2020

## 2020-01-01 NOTE — Plan of Care (Signed)
  Problem: Education: Goal: Knowledge of risk factors and measures for prevention of condition will improve Outcome: Progressing   Problem: Coping: Goal: Psychosocial and spiritual needs will be supported Outcome: Completed/Met

## 2020-01-02 ENCOUNTER — Inpatient Hospital Stay (HOSPITAL_COMMUNITY): Payer: Medicaid Other

## 2020-01-02 LAB — GLUCOSE, CAPILLARY
Glucose-Capillary: 114 mg/dL — ABNORMAL HIGH (ref 70–99)
Glucose-Capillary: 139 mg/dL — ABNORMAL HIGH (ref 70–99)
Glucose-Capillary: 93 mg/dL (ref 70–99)
Glucose-Capillary: 91 mg/dL (ref 70–99)

## 2020-01-02 MED ORDER — TALC (STERITALC) POWDER FOR INTRAPLEURAL USE
4.0000 g | Freq: Once | INTRAVENOUS | Status: DC
  Filled 2020-01-02: qty 4

## 2020-01-02 NOTE — Progress Notes (Addendum)
      [address]       [place] [address]             [phone]      15 Days Post-Op Procedure(s) (LRB): VIDEO ASSISTED THORACOSCOPY (VATS)/EMPYEMA (Right) Subjective: Biggest issue is a dry cough. Otherwise doing well.   Objective: Vital signs in last 24 hours: Temp:  [98 F (36.7 C)-98.5 F (36.9 C)] 98.4 F (36.9 C) (02/19 0720) Pulse Rate:  [102-117] 102 (02/19 0532) Cardiac Rhythm: Normal sinus rhythm (02/19 0725) Resp:  [18-20] 20 (02/19 0720) BP: (105-131)/(73-79) 105/78 (02/19 0532) SpO2:  [91 %-97 %] 94 % (02/19 0720)     Intake/Output from previous day: 02/18 0701 - 02/19 0700 In: 1194 [P.O.:1194] Out: 1800 [Urine:1690; Chest Tube:110] Intake/Output this shift: No intake/output data recorded.  General appearance: alert, cooperative and no distress Heart: regular rate and rhythm, S1, S2 normal, no murmur, click, rub or gallop Lungs: clear to auscultation bilaterally Abdomen: soft, non-tender; bowel sounds normal; no masses,  no organomegaly Extremities: extremities normal, atraumatic, no cyanosis or edema Wound: clean and dry  Lab Results: No results for input(s): WBC, HGB, HCT, PLT in the last 72 hours. BMET: No results for input(s): NA, K, CL, CO2, GLUCOSE, BUN, CREATININE, CALCIUM in the last 72 hours.  PT/INR: No results for input(s): LABPROT, INR in the last 72 hours. ABG    Component Value Date/Time   PHART 7.442 12/19/2019 1049   HCO3 25.0 12/19/2019 1049   TCO2 26 12/19/2019 1049   ACIDBASEDEF 4.1 (H) 11/09/2019 2210   O2SAT 98.0 12/19/2019 1049   CBG (last 3)  Recent Labs    01/01/20 1614 01/01/20 2131 01/02/20 0650  GLUCAP 159* 105* 114*    Assessment/Plan: S/P Procedure(s) (LRB): VIDEO ASSISTED THORACOSCOPY (VATS)/EMPYEMA (Right)  1. Chest tube remains in place on water seal with + air leak and + fluid wave. CXR shows right chest wall subcutaneous emphysema which is unchanged. Persistent right atelectasis.    2. No new labs 3. BP well controlled continue lisinopril. Continue Lipitor. 4. Continue Augmentin for Empyema 5. Lovenox for DVT prophylaxis.   Plan: Talc slurry today through the chest tube.  Patient cannot go home with chest tube in place. Issues with transportation and lives in [place], [place].    [place]: 52 days    Sharlene Dory 01/02/2020  Keep chest tube to waterseal and observe air leak after talc slurry therapy through chest tube.  DC chest tube when air leak resolves.  Check x-ray in a.m.  patient examined and medical record reviewed,agree with above note. Kathlee Nations Trigt III 01/02/2020

## 2020-01-02 NOTE — Progress Notes (Signed)
      [address]       [place] [address]             [phone]      Pre-procedure: stable. Coughing some.  Oral pain medication administered.     Procedure: Two 60cc syringes of talc slurry injected into the patient's chest tube at the bedside. Chest tube going to pleuravac was clamped and chest tube injection site sterilized with betadine. Injected two syringes full of talc slurry using a sterile needle into the chest tube. Patient tolerated the procedure well aside from some coughing. Chest tube unclamped about 2 minutes after injection. Injection site was leaking a little therefore dressed with sterile dressing and tape. To be reinforced as needed. Patient planning to get up to the chair after chest tube site dressing is changed.     Complications: none.   Patient tolerated well.    Jari Favre, PA-C

## 2020-01-02 NOTE — Plan of Care (Signed)
  Problem: Education: Goal: Knowledge of risk factors and measures for prevention of condition will improve Outcome: Progressing   Problem: Respiratory: Goal: Will maintain a patent airway Outcome: Progressing Goal: Complications related to the disease process, condition or treatment will be avoided or minimized Outcome: Progressing   Problem: Education: Goal: Knowledge of General Education information will improve Description: Including pain rating scale, medication(s)/side effects and non-pharmacologic comfort measures Outcome: Progressing   Problem: Health Behavior/Discharge Planning: Goal: Ability to manage health-related needs will improve Outcome: Progressing   Problem: Clinical Measurements: Goal: Ability to maintain clinical measurements within normal limits will improve Outcome: Progressing Goal: Will remain free from infection Outcome: Progressing Goal: Diagnostic test results will improve Outcome: Progressing Goal: Respiratory complications will improve Outcome: Progressing Goal: Cardiovascular complication will be avoided Outcome: Progressing   Problem: Activity: Goal: Risk for activity intolerance will decrease Outcome: Progressing   Problem: Nutrition: Goal: Adequate nutrition will be maintained Outcome: Progressing   Problem: Coping: Goal: Level of anxiety will decrease Outcome: Progressing   Problem: Elimination: Goal: Will not experience complications related to bowel motility Outcome: Progressing Goal: Will not experience complications related to urinary retention Outcome: Progressing   Problem: Pain Managment: Goal: General experience of comfort will improve Outcome: Progressing   Problem: Safety: Goal: Ability to remain free from injury will improve Outcome: Progressing   Problem: Skin Integrity: Goal: Risk for impaired skin integrity will decrease Outcome: Progressing   

## 2020-01-02 NOTE — Progress Notes (Signed)
TCT PA made aware about pt's preference to have appt. in [place] area  To utilize  transportation service, claimed to discuss it with Dr. Donata Clay.

## 2020-01-02 NOTE — Progress Notes (Signed)
RN informed CSW that patient's wife needed to speak regarding follow up apt and transportation. CSW spoke with patient's wife Margaretha Glassing who reports patient has a follow up apt next week in [place] that she was made aware of, however they live in [place] and use [facility] transportation services. She is requesting any follow up apts be made in the [place] area so they can make it to and from appointments.   RN has been made aware of follow up apt location needing to be adjusted for patient to be able to attend his appointments.   [place], [place] [phone]

## 2020-01-02 NOTE — Progress Notes (Signed)
      [address]       [place] [address]             [phone]       Discussed follow-up appointment with nursing. Unfortunately [facility] only has an office in [place] and we do not have an office in [place]. Social work will need to assist with getting the patient to [place] once for a follow-up for an incision check. After that incision check, the patient can follow-up with his PCP in [place].   I will discuss other options with Dr. Donata Clay but I'm not sure any exist.    Jari Favre, PA-C

## 2020-01-03 ENCOUNTER — Inpatient Hospital Stay (HOSPITAL_COMMUNITY): Payer: Medicaid Other

## 2020-01-03 LAB — GLUCOSE, CAPILLARY
Glucose-Capillary: 103 mg/dL — ABNORMAL HIGH (ref 70–99)
Glucose-Capillary: 130 mg/dL — ABNORMAL HIGH (ref 70–99)
Glucose-Capillary: 96 mg/dL (ref 70–99)
Glucose-Capillary: 119 mg/dL — ABNORMAL HIGH (ref 70–99)

## 2020-01-03 NOTE — Progress Notes (Addendum)
      [address]       [place] [address]             [phone]      16 Days Post-Op Procedure(s) (LRB): VIDEO ASSISTED THORACOSCOPY (VATS)/EMPYEMA (Right) Subjective: No pain this morning. Wishing he could go home soon.   Objective: Vital signs in last 24 hours: Temp:  [97.5 F (36.4 C)-99.2 F (37.3 C)] 98.3 F (36.8 C) (02/20 0740) Pulse Rate:  [104-119] 109 (02/20 0740) Cardiac Rhythm: Normal sinus rhythm (02/20 0701) Resp:  [18-20] 18 (02/20 0348) BP: (98-123)/(73-87) 113/83 (02/20 0740) SpO2:  [92 %-99 %] 92 % (02/20 0740)     Intake/Output from previous day: 02/19 0701 - 02/20 0700 In: 714 [P.O.:714] Out: 930 [Urine:700; Chest Tube:230] Intake/Output this shift: No intake/output data recorded.  General appearance: alert, cooperative and no distress Heart: regular rate and rhythm, S1, S2 normal, no murmur, click, rub or gallop Lungs: clear to auscultation bilaterally and right chest tube rub Abdomen: soft, non-tender; bowel sounds normal; no masses,  no organomegaly Extremities: extremities normal, atraumatic, no cyanosis or edema Wound: clean and dry  Lab Results: No results for input(s): WBC, HGB, HCT, PLT in the last 72 hours. BMET: No results for input(s): NA, K, CL, CO2, GLUCOSE, BUN, CREATININE, CALCIUM in the last 72 hours.  PT/INR: No results for input(s): LABPROT, INR in the last 72 hours. ABG    Component Value Date/Time   PHART 7.442 12/19/2019 1049   HCO3 25.0 12/19/2019 1049   TCO2 26 12/19/2019 1049   ACIDBASEDEF 4.1 (H) 11/09/2019 2210   O2SAT 98.0 12/19/2019 1049   CBG (last 3)  Recent Labs    01/02/20 1629 01/02/20 2127 01/03/20 0605  GLUCAP 93 91 103*    Assessment/Plan: S/P Procedure(s) (LRB): VIDEO ASSISTED THORACOSCOPY (VATS)/EMPYEMA (Right)  1. Chest tube remains in place on water seal with + air leak and + fluid wave. CXR shows similar small to moderate pneumothorax with peripheral pleural thickening.  S/p talc slurry pleurodesis yesterday.  2. No new labs, will order for tomorrow.  3. BP well controlled continue lisinopril. Continue Lipitor. ST in the low 100s.  4. Continue Augmentin for Empyema 5. Lovenox for DVT prophylaxis.   Plan: Chest tube to water seal at the moment. Patient is having no pain and tolerating room air. Continued air leak and fluid wave. Pneumothorx on chest xray maybe slightly larger. May need to put back to suction if continues to worsen. Labs and CXR ordered for the morning.     LOS: 53 days    Sharlene Dory 01/03/2020 Patient seen and examined, agree with above Still has an air leak, small amount of purulent drainage in tubing Still has a small space on CXR Costa Jha C. Dorris Fetch, MD [facility] [phone]

## 2020-01-03 NOTE — Plan of Care (Signed)
  Problem: Education: Goal: Knowledge of risk factors and measures for prevention of condition will improve Outcome: Progressing   Problem: Respiratory: Goal: Will maintain a patent airway Outcome: Progressing Goal: Complications related to the disease process, condition or treatment will be avoided or minimized Outcome: Progressing   Problem: Education: Goal: Knowledge of General Education information will improve Description: Including pain rating scale, medication(s)/side effects and non-pharmacologic comfort measures Outcome: Progressing   Problem: Health Behavior/Discharge Planning: Goal: Ability to manage health-related needs will improve Outcome: Progressing   Problem: Clinical Measurements: Goal: Ability to maintain clinical measurements within normal limits will improve Outcome: Progressing Goal: Will remain free from infection Outcome: Progressing Goal: Diagnostic test results will improve Outcome: Progressing Goal: Respiratory complications will improve Outcome: Progressing Goal: Cardiovascular complication will be avoided Outcome: Progressing   Problem: Activity: Goal: Risk for activity intolerance will decrease Outcome: Progressing   Problem: Nutrition: Goal: Adequate nutrition will be maintained Outcome: Progressing   Problem: Coping: Goal: Level of anxiety will decrease Outcome: Progressing   Problem: Elimination: Goal: Will not experience complications related to bowel motility Outcome: Progressing Goal: Will not experience complications related to urinary retention Outcome: Progressing   Problem: Pain Managment: Goal: General experience of comfort will improve Outcome: Progressing   Problem: Safety: Goal: Ability to remain free from injury will improve Outcome: Progressing   Problem: Skin Integrity: Goal: Risk for impaired skin integrity will decrease Outcome: Progressing   

## 2020-01-04 ENCOUNTER — Inpatient Hospital Stay (HOSPITAL_COMMUNITY): Payer: Medicaid Other

## 2020-01-04 LAB — CBC
HCT: 29.1 % — ABNORMAL LOW (ref 39.0–52.0)
Hemoglobin: 9.4 g/dL — ABNORMAL LOW (ref 13.0–17.0)
MCH: 26.9 pg (ref 26.0–34.0)
MCHC: 32.3 g/dL (ref 30.0–36.0)
MCV: 83.1 fL (ref 80.0–100.0)
Platelets: 450 10*3/uL — ABNORMAL HIGH (ref 150–400)
RBC: 3.5 MIL/uL — ABNORMAL LOW (ref 4.22–5.81)
RDW: 17.2 % — ABNORMAL HIGH (ref 11.5–15.5)
WBC: 17.4 10*3/uL — ABNORMAL HIGH (ref 4.0–10.5)
nRBC: 0 % (ref 0.0–0.2)

## 2020-01-04 LAB — BASIC METABOLIC PANEL
Anion gap: 12 (ref 5–15)
BUN: 15 mg/dL (ref 6–20)
CO2: 23 mmol/L (ref 22–32)
Calcium: 8.9 mg/dL (ref 8.9–10.3)
Chloride: 98 mmol/L (ref 98–111)
Creatinine, Ser: 0.87 mg/dL (ref 0.61–1.24)
GFR calc Af Amer: >60 mL/min (ref >60)
GFR calc non Af Amer: >60 mL/min (ref >60)
Glucose, Bld: 115 mg/dL — ABNORMAL HIGH (ref 70–99)
Potassium: 4.3 mmol/L (ref 3.5–5.1)
Sodium: 133 mmol/L — ABNORMAL LOW (ref 135–145)

## 2020-01-04 LAB — GLUCOSE, CAPILLARY
Glucose-Capillary: 104 mg/dL — ABNORMAL HIGH (ref 70–99)
Glucose-Capillary: 129 mg/dL — ABNORMAL HIGH (ref 70–99)
Glucose-Capillary: 111 mg/dL — ABNORMAL HIGH (ref 70–99)
Glucose-Capillary: 127 mg/dL — ABNORMAL HIGH (ref 70–99)

## 2020-01-04 NOTE — Plan of Care (Signed)

## 2020-01-04 NOTE — Progress Notes (Signed)
Pt ambulated in a hallway, in between of his walking he needed oxygen at 2 l and rest but while he is sitting in a recliner he is fine. Robitussin syrup given throughout the day with little help in coughing, denies chest pain, chest tube output recorded and dressing change, site is clean, dry, intact, pt is talking to his wife most of the time over phone and she is updated, will continue to monitor the patient  Lonia Farber, RN

## 2020-01-04 NOTE — Progress Notes (Signed)
17 Days Post-Op Procedure(s) (LRB): VIDEO ASSISTED THORACOSCOPY (VATS)/EMPYEMA (Right) Subjective: No complaints this AM  Objective: Vital signs in last 24 hours: Temp:  [97.6 F (36.4 C)-98.2 F (36.8 C)] 98 F (36.7 C) (02/21 0741) Pulse Rate:  [90-115] 107 (02/20 2300) Cardiac Rhythm: Sinus tachycardia (02/21 0700) Resp:  [14-20] 16 (02/21 0741) BP: (104-122)/(66-82) 116/81 (02/21 0741) SpO2:  [90 %-100 %] 98 % (02/21 0741)  Hemodynamic parameters for last 24 hours:    Intake/Output from previous day: 02/20 0701 - 02/21 0700 In: 720 [P.O.:720] Out: 1625 [Urine:1475; Chest Tube:150] Intake/Output this shift: Total I/O In: 240 [P.O.:240] Out: 0   General appearance: alert, cooperative and no distress Neurologic: intact Heart: regular rate and rhythm Lungs: diminished breath sounds bibasilar Air leak significantly smaller  Lab Results: Recent Labs    01/04/20 0656  WBC 17.4*  HGB 9.4*  HCT 29.1*  PLT 450*   BMET:  Recent Labs    01/04/20 0656  NA 133*  K 4.3  CL 98  CO2 23  GLUCOSE 115*  BUN 15  CREATININE 0.87  CALCIUM 8.9    PT/INR: No results for input(s): LABPROT, INR in the last 72 hours. ABG    Component Value Date/Time   PHART 7.442 12/19/2019 1049   HCO3 25.0 12/19/2019 1049   TCO2 26 12/19/2019 1049   ACIDBASEDEF 4.1 (H) 11/09/2019 2210   O2SAT 98.0 12/19/2019 1049   CBG (last 3)  Recent Labs    01/03/20 1613 01/03/20 2118 01/04/20 0658  GLUCAP 96 119* 104*    Assessment/Plan: S/P Procedure(s) (LRB): VIDEO ASSISTED THORACOSCOPY (VATS)/EMPYEMA (Right) -His is air leak is minimal, almost gone completely and significantly decreased from yesterday. CXR also improved Continue Augmentin   LOS: 54 days    Loreli Slot 01/04/2020

## 2020-01-05 ENCOUNTER — Inpatient Hospital Stay (HOSPITAL_COMMUNITY): Payer: Medicaid Other

## 2020-01-05 LAB — BASIC METABOLIC PANEL
Anion gap: 12 (ref 5–15)
BUN: 18 mg/dL (ref 6–20)
CO2: 24 mmol/L (ref 22–32)
Calcium: 8.9 mg/dL (ref 8.9–10.3)
Chloride: 100 mmol/L (ref 98–111)
Creatinine, Ser: 1.02 mg/dL (ref 0.61–1.24)
GFR calc Af Amer: >60 mL/min (ref >60)
GFR calc non Af Amer: >60 mL/min (ref >60)
Glucose, Bld: 107 mg/dL — ABNORMAL HIGH (ref 70–99)
Potassium: 4.1 mmol/L (ref 3.5–5.1)
Sodium: 136 mmol/L (ref 135–145)

## 2020-01-05 LAB — CBC
HCT: 28.7 % — ABNORMAL LOW (ref 39.0–52.0)
Hemoglobin: 9 g/dL — ABNORMAL LOW (ref 13.0–17.0)
MCH: 26.5 pg (ref 26.0–34.0)
MCHC: 31.4 g/dL (ref 30.0–36.0)
MCV: 84.4 fL (ref 80.0–100.0)
Platelets: 423 10*3/uL — ABNORMAL HIGH (ref 150–400)
RBC: 3.4 MIL/uL — ABNORMAL LOW (ref 4.22–5.81)
RDW: 17.2 % — ABNORMAL HIGH (ref 11.5–15.5)
WBC: 15.2 10*3/uL — ABNORMAL HIGH (ref 4.0–10.5)
nRBC: 0 % (ref 0.0–0.2)

## 2020-01-05 LAB — GLUCOSE, CAPILLARY
Glucose-Capillary: 96 mg/dL (ref 70–99)
Glucose-Capillary: 166 mg/dL — ABNORMAL HIGH (ref 70–99)
Glucose-Capillary: 132 mg/dL — ABNORMAL HIGH (ref 70–99)
Glucose-Capillary: 120 mg/dL — ABNORMAL HIGH (ref 70–99)

## 2020-01-05 NOTE — Progress Notes (Signed)
Nutrition Follow up  DOCUMENTATION CODES:   Obesity unspecified  INTERVENTION:    Continue Ensure Enlive po TID, each supplement provides 350 kcal and 20 grams of protein  Continue Magic cup TID with meals, each supplement provides 290 kcal and 9 grams of protein  MVI daily   NUTRITION DIAGNOSIS:   Inadequate oral intake related to acute illness, catabolic illness, decreased appetite as evidenced by NPO status, meal completion < 50%.  Ongoing  GOAL:   Patient will meet greater than or equal to 90% of their needs   Meeting   MONITOR:   PO intake, Supplement acceptance, Labs, Weight trends, Diet advancement  REASON FOR ASSESSMENT:   Consult, Ventilator Enteral/tube feeding initiation and management  ASSESSMENT:   54 yo male admitted with COVID 19 pneumonia, developed worsening pneumothorax requiring chest tube, brief intubation.   PMH includes DM, HTN, morbid obesity  12/29 Admit 01/13 Worsening pneumothorax, chest tube placed 01/17 Intubated 01/18 Extubated 2/4 s/p VATS for drainage empyema, removal of right pleural space hematoma 2/5 extubated, diet advanced   Possible tube d/c after repeat CXR.   Appetite remains great. Meal completions charted as 90-100% for his last eight meals. Drinking Ensure TID. Does not wish to have anything else.   Admission weight: 95.5 kg  Current weight: 93.7 kg   I/O: -12,586 ml since 2/8 UOP: 1,625 ml x 24 hrs   Medications: dulcolax, SS novolog, metformin, MVI with minerals, senokot Labs: CBG 96-166  Diet Order:   Diet Order            Diet heart healthy/carb modified Room service appropriate? Yes; Fluid consistency: Thin  Diet effective now              EDUCATION NEEDS:   Not appropriate for education at this time  Skin:  Skin Assessment: Skin Integrity Issues: Skin Integrity Issues:: Other (Comment), Incisions Incisions: R chest Other: skin tear buttocks  Last BM:  2/21  Height:   Ht Readings from  Last 1 Encounters:  11/11/19 5\' 5"  (1.651 m)    Weight:   Wt Readings from Last 1 Encounters:  12/26/19 93.7 kg    Ideal Body Weight:  61.8 kg  BMI:  Body mass index is 34.38 kg/m.  Estimated Nutritional Needs:   Kcal:  2325-2780 kcals  Protein:  135-160 g  Fluid:  >/= 2.2 L  03-29-2006 RD, LDN Clinical Nutrition Pager # [phone]

## 2020-01-05 NOTE — Progress Notes (Addendum)
[address]       [place] [address]             [phone]      18 Days Post-Op Procedure(s) (LRB): VIDEO ASSISTED THORACOSCOPY (VATS)/EMPYEMA (Right) Subjective: Feels ok, no new c/o  Objective: Vital signs in last 24 hours: Temp:  [97.6 F (36.4 C)-98.4 F (36.9 C)] 97.6 F (36.4 C) (02/22 0748) Pulse Rate:  [96-109] 103 (02/22 0733) Cardiac Rhythm: Normal sinus rhythm (02/22 0410) Resp:  [16-18] 18 (02/22 0350) BP: (101-122)/(75-87) 122/87 (02/22 0749) SpO2:  [93 %-100 %] 93 % (02/22 0733)  Hemodynamic parameters for last 24 hours:    Intake/Output from previous day: 02/21 0701 - 02/22 0700 In: 490 [P.O.:480; I.V.:10] Out: 1755 [Urine:1625; Chest Tube:130] Intake/Output this shift: No intake/output data recorded.  General appearance: alert, cooperative and no distress Heart: regular rate and rhythm Lungs: mildly dim on right c/w left but clear BS Abdomen: benign Extremities: no edema or calf tenderness Wound: incis healing well  Lab Results: Recent Labs    01/04/20 0656 01/05/20 0421  WBC 17.4* 15.2*  HGB 9.4* 9.0*  HCT 29.1* 28.7*  PLT 450* 423*   BMET:  Recent Labs    01/04/20 0656 01/05/20 0421  NA 133* 136  K 4.3 4.1  CL 98 100  CO2 23 24  GLUCOSE 115* 107*  BUN 15 18  CREATININE 0.87 1.02  CALCIUM 8.9 8.9    PT/INR: No results for input(s): LABPROT, INR in the last 72 hours. ABG    Component Value Date/Time   PHART 7.442 12/19/2019 1049   HCO3 25.0 12/19/2019 1049   TCO2 26 12/19/2019 1049   ACIDBASEDEF 4.1 (H) 11/09/2019 2210   O2SAT 98.0 12/19/2019 1049   CBG (last 3)  Recent Labs    01/04/20 1645 01/04/20 2118 01/05/20 0610  GLUCAP 111* 127* 96    Meds Scheduled Meds: . amoxicillin-clavulanate  1 tablet Oral Q12H  . atorvastatin  10 mg Oral QPM  . bisacodyl  10 mg Oral Daily  . Chlorhexidine Gluconate Cloth  6 each Topical Daily  . enoxaparin (LOVENOX) injection  40 mg Subcutaneous Q24H  .  feeding supplement (ENSURE ENLIVE)  237 mL Oral TID BM  . guaiFENesin  600 mg Oral BID  . insulin aspart  0-24 Units Subcutaneous TID AC & HS  . lisinopril  20 mg Oral Daily  . mouth rinse  15 mL Mouth Rinse BID  . metFORMIN  500 mg Oral Q breakfast  . multivitamin with minerals  1 tablet Oral Daily  . pantoprazole  40 mg Oral Q1200  . senna-docusate  1 tablet Oral QHS  . sodium chloride flush  10-40 mL Intracatheter Q12H  . talc (STERITALC) slurry in NS  4 g Intrapleural Once   Continuous Infusions: PRN Meds:.fentaNYL (SUBLIMAZE) injection, guaiFENesin, influenza vac split quadrivalent PF, levalbuterol, ondansetron (ZOFRAN) IV, oxyCODONE, sodium chloride, sodium chloride flush, traMADol  Xrays DG Chest Port 1 View  Result Date: 01/05/2020 CLINICAL DATA:  Followup pneumothorax. EXAM: PORTABLE CHEST 1 VIEW COMPARISON:  Multiple previous chest films. The most recent is 01/04/2020. FINDINGS: The right-sided chest tube is stable. Persistent loculated pleural fluid with associated tiny apical pneumothorax. Persistent right basilar atelectasis. Persistent but slightly improved subcutaneous emphysema. The left lung remains clear. IMPRESSION: Stable right-sided chest tube with persistent loculated pleural fluid and associated tiny apical pneumothorax. Electronically Signed   By: Rudie Meyer M.D.   On: 01/05/2020 08:03  DG Chest Port 1 View  Result Date: 01/04/2020 CLINICAL DATA:  Pneumothorax.  COVID positive. EXAM: PORTABLE CHEST 1 VIEW COMPARISON:  Yesterday FINDINGS: Slight decrease in hydropneumothorax on the right with pleural thickening along the lateral chest wall. The left chest is clear. Stable heart size. Stable chest wall emphysema. IMPRESSION: Hydropneumothorax on the right with slight decrease in the gas component. Electronically Signed   By: Monte Fantasia M.D.   On: 01/04/2020 06:20    Assessment/Plan: S/P Procedure(s) (LRB): VIDEO ASSISTED THORACOSCOPY (VATS)/EMPYEMA  (Right)  1 afeb, VSS 2 sats good on RA 3 CT - no air leaks, CXR is stable- will clamp CT today 4 leukocytosis improving, conts current ABX 5 renal fxn is normal 6 BS controlled 7 repeat CXR in am and observe clinically- may be able to d/c tube at that time    LOS: 55 days    Nathaniel Webb [facility] 01/05/2020 Pager [phone]  Chest x-ray shows stable right costophrenic space and lateral space No air leak with cough today Chest tube clamped 4 hours without change in symptoms and follow-up chest x-ray shows no change. When I unclamped the chest tube there is no drainage of air bubbles. We will plan on removing chest tube in a.m. after chest x-ray, too late in day for chest tube removal now. Patient should be ready for discharge about 24 hours after chest tube removed.  patient examined and medical record reviewed,agree with above note. Tharon Aquas Trigt III 01/05/2020

## 2020-01-05 NOTE — Plan of Care (Signed)
  Problem: Education: Goal: Knowledge of risk factors and measures for prevention of condition will improve Outcome: Progressing   Problem: Respiratory: Goal: Will maintain a patent airway Outcome: Progressing Goal: Complications related to the disease process, condition or treatment will be avoided or minimized Outcome: Progressing   Problem: Education: Goal: Knowledge of General Education information will improve Description: Including pain rating scale, medication(s)/side effects and non-pharmacologic comfort measures Outcome: Progressing   Problem: Health Behavior/Discharge Planning: Goal: Ability to manage health-related needs will improve Outcome: Progressing   Problem: Clinical Measurements: Goal: Ability to maintain clinical measurements within normal limits will improve Outcome: Progressing Goal: Will remain free from infection Outcome: Progressing Goal: Diagnostic test results will improve Outcome: Progressing Goal: Respiratory complications will improve Outcome: Progressing Goal: Cardiovascular complication will be avoided Outcome: Progressing   Problem: Activity: Goal: Risk for activity intolerance will decrease Outcome: Progressing   Problem: Nutrition: Goal: Adequate nutrition will be maintained Outcome: Progressing   Problem: Coping: Goal: Level of anxiety will decrease Outcome: Progressing   Problem: Elimination: Goal: Will not experience complications related to bowel motility Outcome: Progressing Goal: Will not experience complications related to urinary retention Outcome: Progressing   Problem: Pain Managment: Goal: General experience of comfort will improve Outcome: Progressing   Problem: Safety: Goal: Ability to remain free from injury will improve Outcome: Progressing   Problem: Skin Integrity: Goal: Risk for impaired skin integrity will decrease Outcome: Progressing   

## 2020-01-06 ENCOUNTER — Inpatient Hospital Stay (HOSPITAL_COMMUNITY): Payer: Medicaid Other

## 2020-01-06 LAB — GLUCOSE, CAPILLARY
Glucose-Capillary: 104 mg/dL — ABNORMAL HIGH (ref 70–99)
Glucose-Capillary: 122 mg/dL — ABNORMAL HIGH (ref 70–99)
Glucose-Capillary: 130 mg/dL — ABNORMAL HIGH (ref 70–99)

## 2020-01-06 NOTE — Progress Notes (Signed)
Physical Therapy Treatment Patient Details Name: Nathaniel Webb MRN: [id number] DOB: 03-03-1966 Today's Date: 01/06/2020    History of Present Illness Pt is a 54 y.o. male who tested (+) COVID-19 on 11/04/20, admitted 11/10/20 with worsening SOB, found to have acute hypoxic respiratory failure secondary to COVID pneumonitis. 1/10 CXR with ARDS, pt with acute PE and loculated R pleural effusion, concern for aspiration. 1/11 required chest tube placement. 1/12 chest CT with hydropneumothorax; s/p thoracentesis 1/12 with fluid sample concerning for empyema. 1/13 transfer to ICU for worsening hypoxia; additional chest tube placed. ETT 1/17-1/18. 1/21 developed hematuria. S/p VATS 2/4; intubated post-procedure and extubated 2/5. PMH includes HTN, DM.    PT Comments    Patient continues to make progress with mobility and tolerated increased gait distance. SpO2 desat to 87% on RA but recovers quickly with rest and PLB. Pt with DOE so standing rest breaks needed. Pt will continue to benefit from further skilled PT services to maximize independence and safety with mobility.     Follow Up Recommendations  Home health PT;Supervision - Intermittent     Equipment Recommendations  3in1 (PT);Other (comment)(4 wheeled walker, rollator)    Recommendations for Other Services       Precautions / Restrictions Precautions Precautions: Fall Restrictions Weight Bearing Restrictions: No    Mobility  Bed Mobility               General bed mobility comments: Received sitting in recliner  Transfers Overall transfer level: Needs assistance Equipment used: Rolling walker (2 wheeled) Transfers: Sit to/from Stand Sit to Stand: Supervision            Ambulation/Gait Ambulation/Gait assistance: Supervision Gait Distance (Feet): (~300 ft total with 2 standing rest breaks) Assistive device: Rolling walker (2 wheeled) Gait Pattern/deviations: Step-through pattern Gait velocity: functional    General Gait Details: steady gait overall; standing rest breaks due to DOE and SpO2 desat to 87% on RA but recovers quickly with rest and PLB    Stairs             Wheelchair Mobility    Modified Rankin (Stroke Patients Only)       Balance Overall balance assessment: Needs assistance Sitting-balance support: No upper extremity supported;Feet supported Sitting balance-Leahy Scale: Good     Standing balance support: Bilateral upper extremity supported Standing balance-Leahy Scale: Poor                              Cognition Arousal/Alertness: Awake/alert Behavior During Therapy: WFL for tasks assessed/performed Overall Cognitive Status: Within Functional Limits for tasks assessed                                        Exercises      General Comments        Pertinent Vitals/Pain Pain Assessment: Faces Faces Pain Scale: Hurts a little bit Pain Location: chest tube site Pain Descriptors / Indicators: Discomfort Pain Intervention(s): Monitored during session    Home Living                      Prior Function            PT Goals (current goals can now be found in the care plan section) Progress towards PT goals: Progressing toward goals    Frequency    Min  3X/week      PT Plan Current plan remains appropriate    Co-evaluation              AM-PAC PT "6 Clicks" Mobility   Outcome Measure  Help needed turning from your back to your side while in a flat bed without using bedrails?: None Help needed moving from lying on your back to sitting on the side of a flat bed without using bedrails?: None Help needed moving to and from a bed to a chair (including a wheelchair)?: None Help needed standing up from a chair using your arms (e.g., wheelchair or bedside chair)?: None Help needed to walk in hospital room?: None Help needed climbing 3-5 steps with a railing? : A Little 6 Click Score: 23    End of Session    Activity Tolerance: Patient tolerated treatment well Patient left: in chair;with call bell/phone within reach Nurse Communication: Mobility status PT Visit Diagnosis: Unsteadiness on feet (R26.81);Muscle weakness (generalized) (M62.81)     Time: 3825-0539 PT Time Calculation (min) (ACUTE ONLY): 17 min  Charges:  $Gait Training: 8-22 mins                     Earney Navy, PTA Acute Rehabilitation Services Pager: [phone] Office: [phone]     Darliss Cheney 01/06/2020, 4:48 PM

## 2020-01-06 NOTE — Progress Notes (Addendum)
[address]       [employer] [address]             [phone]      19 Days Post-Op Procedure(s) (LRB): VIDEO ASSISTED THORACOSCOPY (VATS)/EMPYEMA (Right) Subjective: Feels pretty well Clamp taken off chest tube at some point  Objective: Vital signs in last 24 hours: Temp:  [97.6 F (36.4 C)-98.4 F (36.9 C)] 98.2 F (36.8 C) (02/23 0357) Pulse Rate:  [96-115] 96 (02/23 0357) Cardiac Rhythm: Sinus tachycardia (02/22 1913) Resp:  [16-20] 16 (02/23 0357) BP: (97-135)/(72-89) 97/73 (02/23 0357) SpO2:  [93 %-98 %] 98 % (02/23 0357)  Hemodynamic parameters for last 24 hours:    Intake/Output from previous day: 02/22 0701 - 02/23 0700 In: 250 [P.O.:240; I.V.:10] Out: 430 [Urine:350; Chest Tube:80] Intake/Output this shift: No intake/output data recorded.  General appearance: alert, cooperative and no distress Heart: regular rate and rhythm Lungs: coarse BS Abdomen: benign Extremities: no edema or calf tenderness Wound: incis healing well  Lab Results: Recent Labs    01/04/20 0656 01/05/20 0421  WBC 17.4* 15.2*  HGB 9.4* 9.0*  HCT 29.1* 28.7*  PLT 450* 423*   BMET:  Recent Labs    01/04/20 0656 01/05/20 0421  NA 133* 136  K 4.3 4.1  CL 98 100  CO2 23 24  GLUCOSE 115* 107*  BUN 15 18  CREATININE 0.87 1.02  CALCIUM 8.9 8.9    PT/INR: No results for input(s): LABPROT, INR in the last 72 hours. ABG    Component Value Date/Time   PHART 7.442 12/19/2019 1049   HCO3 25.0 12/19/2019 1049   TCO2 26 12/19/2019 1049   ACIDBASEDEF 4.1 (H) 11/09/2019 2210   O2SAT 98.0 12/19/2019 1049   CBG (last 3)  Recent Labs    01/05/20 1631 01/05/20 2111 01/06/20 0602  GLUCAP 132* 120* 104*    Meds Scheduled Meds: . amoxicillin-clavulanate  1 tablet Oral Q12H  . atorvastatin  10 mg Oral QPM  . bisacodyl  10 mg Oral Daily  . Chlorhexidine Gluconate Cloth  6 each Topical Daily  . enoxaparin (LOVENOX) injection  40 mg Subcutaneous Q24H  .  feeding supplement (ENSURE ENLIVE)  237 mL Oral TID BM  . guaiFENesin  600 mg Oral BID  . insulin aspart  0-24 Units Subcutaneous TID AC & HS  . lisinopril  20 mg Oral Daily  . mouth rinse  15 mL Mouth Rinse BID  . metFORMIN  500 mg Oral Q breakfast  . multivitamin with minerals  1 tablet Oral Daily  . pantoprazole  40 mg Oral Q1200  . senna-docusate  1 tablet Oral QHS  . sodium chloride flush  10-40 mL Intracatheter Q12H   Continuous Infusions: PRN Meds:.fentaNYL (SUBLIMAZE) injection, guaiFENesin, influenza vac split quadrivalent PF, levalbuterol, ondansetron (ZOFRAN) IV, oxyCODONE, sodium chloride, sodium chloride flush, traMADol  Xrays DG Chest Port 1 View  Result Date: 01/05/2020 CLINICAL DATA:  Right pneumothorax. Status post VATS for empyema. EXAM: PORTABLE CHEST 1 VIEW COMPARISON:  01/05/2020 and 01/04/2020 and 01/02/2020 FINDINGS: Right chest tube is unchanged. Diffuse pleural thickening is unchanged. Small right hydropneumothorax at the base laterally, unchanged. The aerated portion of the right lung is clear. Left lung is clear. Heart size and vascularity are normal. Decreased subcutaneous emphysema on the right. IMPRESSION: No change in the small right hydropneumothorax. Electronically Signed   By: Lorriane Shire M.D.   On: 01/05/2020 13:35   DG Chest [facility]  Result Date: 01/05/2020 CLINICAL DATA:  Followup pneumothorax. EXAM: PORTABLE CHEST 1 VIEW COMPARISON:  Multiple previous chest films. The most recent is 01/04/2020. FINDINGS: The right-sided chest tube is stable. Persistent loculated pleural fluid with associated tiny apical pneumothorax. Persistent right basilar atelectasis. Persistent but slightly improved subcutaneous emphysema. The left lung remains clear. IMPRESSION: Stable right-sided chest tube with persistent loculated pleural fluid and associated tiny apical pneumothorax. Electronically Signed   By: Rudie Meyer M.D.   On: 01/05/2020 08:03     Assessment/Plan: S/P Procedure(s) (LRB): VIDEO ASSISTED THORACOSCOPY (VATS)/EMPYEMA (Right)  1 stable overall 2 CXR appears pretty stable, no def air leak, some tidaling in tube 3 no new labs 3 will d/w MD possibly removing tube today   LOS: 56 days    [facility] 01/06/2020 Pager [phone]  CXR stable, patient on room air DC chest tube and followup CXR in AM- hopefully ready for discharge patient examined and medical record reviewed,agree with above note. Kathlee Nations Trigt III 01/06/2020

## 2020-01-06 NOTE — Plan of Care (Signed)
  Problem: Education: Goal: Knowledge of risk factors and measures for prevention of condition will improve Outcome: Progressing   Problem: Respiratory: Goal: Will maintain a patent airway Outcome: Progressing Goal: Complications related to the disease process, condition or treatment will be avoided or minimized Outcome: Progressing   Problem: Education: Goal: Knowledge of General Education information will improve Description: Including pain rating scale, medication(s)/side effects and non-pharmacologic comfort measures Outcome: Progressing   Problem: Health Behavior/Discharge Planning: Goal: Ability to manage health-related needs will improve Outcome: Progressing   Problem: Clinical Measurements: Goal: Ability to maintain clinical measurements within normal limits will improve Outcome: Progressing Goal: Will remain free from infection Outcome: Progressing Goal: Diagnostic test results will improve Outcome: Progressing Goal: Respiratory complications will improve Outcome: Progressing Goal: Cardiovascular complication will be avoided Outcome: Progressing   Problem: Activity: Goal: Risk for activity intolerance will decrease Outcome: Progressing   Problem: Nutrition: Goal: Adequate nutrition will be maintained Outcome: Progressing   Problem: Coping: Goal: Level of anxiety will decrease Outcome: Progressing   Problem: Elimination: Goal: Will not experience complications related to bowel motility Outcome: Progressing Goal: Will not experience complications related to urinary retention Outcome: Progressing   Problem: Pain Managment: Goal: General experience of comfort will improve Outcome: Progressing   Problem: Safety: Goal: Ability to remain free from injury will improve Outcome: Progressing   Problem: Skin Integrity: Goal: Risk for impaired skin integrity will decrease Outcome: Progressing   

## 2020-01-06 NOTE — Plan of Care (Signed)
  Problem: Education: Goal: Knowledge of risk factors and measures for prevention of condition will improve Outcome: Progressing   Problem: Respiratory: Goal: Will maintain a patent airway Outcome: Progressing Goal: Complications related to the disease process, condition or treatment will be avoided or minimized Outcome: Progressing   Problem: Education: Goal: Knowledge of General Education information will improve Description: Including pain rating scale, medication(s)/side effects and non-pharmacologic comfort measures Outcome: Progressing   Problem: Health Behavior/Discharge Planning: Goal: Ability to manage health-related needs will improve Outcome: Progressing   Problem: Clinical Measurements: Goal: Ability to maintain clinical measurements within normal limits will improve Outcome: Progressing Goal: Will remain free from infection Outcome: Progressing Goal: Diagnostic test results will improve Outcome: Progressing Goal: Respiratory complications will improve Outcome: Progressing Goal: Cardiovascular complication will be avoided Outcome: Progressing   Problem: Activity: Goal: Risk for activity intolerance will decrease Outcome: Progressing   Problem: Nutrition: Goal: Adequate nutrition will be maintained Outcome: Progressing   Problem: Coping: Goal: Level of anxiety will decrease Outcome: Progressing   Problem: Pain Managment: Goal: General experience of comfort will improve Outcome: Progressing   Problem: Elimination: Goal: Will not experience complications related to bowel motility Outcome: Progressing Goal: Will not experience complications related to urinary retention Outcome: Progressing   Problem: Safety: Goal: Ability to remain free from injury will improve Outcome: Progressing   Problem: Skin Integrity: Goal: Risk for impaired skin integrity will decrease Outcome: Progressing

## 2020-01-07 ENCOUNTER — Other Ambulatory Visit: Payer: Self-pay | Admitting: Surgical

## 2020-01-07 ENCOUNTER — Other Ambulatory Visit: Payer: Self-pay | Admitting: Physician Assistant

## 2020-01-07 ENCOUNTER — Ambulatory Visit: Payer: Self-pay | Admitting: Internal Medicine

## 2020-01-07 ENCOUNTER — Inpatient Hospital Stay (HOSPITAL_COMMUNITY): Payer: Medicaid Other

## 2020-01-07 ENCOUNTER — Telehealth: Payer: Self-pay | Admitting: *Deleted

## 2020-01-07 ENCOUNTER — Ambulatory Visit: Payer: Self-pay | Admitting: Cardiothoracic Surgery

## 2020-01-07 LAB — GLUCOSE, CAPILLARY
Glucose-Capillary: 100 mg/dL — ABNORMAL HIGH (ref 70–99)
Glucose-Capillary: 105 mg/dL — ABNORMAL HIGH (ref 70–99)
Glucose-Capillary: 96 mg/dL (ref 70–99)

## 2020-01-07 MED ORDER — OXYCODONE HCL 5 MG PO CAPS: 5.0000 mg | ORAL_CAPSULE | Freq: Four times a day (QID) | ORAL | 0 refills | Status: DC | PRN

## 2020-01-07 MED ORDER — OXYCODONE HCL 5 MG PO TABS: 5.0000 mg | ORAL_TABLET | Freq: Four times a day (QID) | ORAL | 0 refills | Status: DC | PRN

## 2020-01-07 MED ORDER — AMOXICILLIN-POT CLAVULANATE 875-125 MG PO TABS: 1.0000 | ORAL_TABLET | Freq: Two times a day (BID) | ORAL | 0 refills | Status: DC

## 2020-01-07 NOTE — Progress Notes (Unsigned)
         Oxycodone ordered for a different pharmacy [facility]) because medication management does not fill e-prescribe narcotics. Told to rip up paper script.    Jari Favre, PA-C

## 2020-01-07 NOTE — Discharge Instructions (Signed)
Information on my medicine - Coumadin   (Warfarin)  This medication education was reviewed with me or my healthcare representative as part of my discharge preparation.  The pharmacist that spoke with me during my hospital stay was:  Napoleon Form, [facility]  Why was Coumadin prescribed for you? Coumadin was prescribed for you because you have a blood clot or a medical condition that can cause an increased risk of forming blood clots. Blood clots can cause serious health problems by blocking the flow of blood to the heart, lung, or brain. Coumadin can prevent harmful blood clots from forming. As a reminder your indication for Coumadin is:   Pulmonary Embolism Treatment  What test will check on my response to Coumadin? While on Coumadin (warfarin) you will need to have an INR test regularly to ensure that your dose is keeping you in the desired range. The INR (international normalized ratio) number is calculated from the result of the laboratory test called prothrombin time (PT).  If an INR APPOINTMENT HAS NOT ALREADY BEEN MADE FOR YOU please schedule an appointment to have this lab work done by your health care provider within 7 days. Your INR goal is usually a number between:  2 to 3 or your provider may give you a more narrow range like 2-2.5.  Ask your health care provider during an office visit what your goal INR is.  What  do you need to  know  About  COUMADIN? Take Coumadin (warfarin) exactly as prescribed by your healthcare provider about the same time each day.  DO NOT stop taking without talking to the doctor who prescribed the medication.  Stopping without other blood clot prevention medication to take the place of Coumadin may increase your risk of developing a new clot or stroke.  Get refills before you run out.  What do you do if you miss a dose? If you miss a dose, take it as soon as you remember on the same day then continue your regularly scheduled regimen the next day.  Do not take  two doses of Coumadin at the same time.  Important Safety Information A possible side effect of Coumadin (Warfarin) is an increased risk of bleeding. You should call your healthcare provider right away if you experience any of the following: ? Bleeding from an injury or your nose that does not stop. ? Unusual colored urine (red or dark brown) or unusual colored stools (red or black). ? Unusual bruising for unknown reasons. ? A serious fall or if you hit your head (even if there is no bleeding).  Some foods or medicines interact with Coumadin (warfarin) and might alter your response to warfarin. To help avoid this: ? Eat a balanced diet, maintaining a consistent amount of Vitamin K. ? Notify your provider about major diet changes you plan to make. ? Avoid alcohol or limit your intake to 1 drink for women and 2 drinks for men per day. (1 drink is 5 oz. wine, 12 oz. beer, or 1.5 oz. liquor.)  Make sure that ANY health care provider who prescribes medication for you knows that you are taking Coumadin (warfarin).  Also make sure the healthcare provider who is monitoring your Coumadin knows when you have started a new medication including herbals and non-prescription products.  Coumadin (Warfarin)  Major Drug Interactions  Increased Warfarin Effect Decreased Warfarin Effect  Alcohol (large quantities) Antibiotics (esp. Septra/Bactrim, Flagyl, Cipro) Amiodarone (Cordarone) Aspirin (ASA) Cimetidine (Tagamet) Megestrol (Megace) NSAIDs (ibuprofen, naproxen, etc.) Piroxicam (  Feldene) Propafenone (Rythmol SR) Propranolol (Inderal) Isoniazid (INH) Posaconazole (Noxafil) Barbiturates (Phenobarbital) Carbamazepine (Tegretol) Chlordiazepoxide (Librium) Cholestyramine (Questran) Griseofulvin Oral Contraceptives Rifampin Sucralfate (Carafate) Vitamin K   Coumadin (Warfarin) Major Herbal Interactions  Increased Warfarin Effect Decreased Warfarin Effect  Garlic Ginseng Ginkgo biloba  Coenzyme Q10 Green tea St. John's wort    Coumadin (Warfarin) FOOD Interactions  Eat a consistent number of servings per week of foods HIGH in Vitamin K (1 serving =  cup)  Collards (cooked, or boiled & drained) Kale (cooked, or boiled & drained) Mustard greens (cooked, or boiled & drained) Parsley *serving size only =  cup Spinach (cooked, or boiled & drained) Swiss chard (cooked, or boiled & drained) Turnip greens (cooked, or boiled & drained)  Eat a consistent number of servings per week of foods MEDIUM-HIGH in Vitamin K (1 serving = 1 cup)  Asparagus (cooked, or boiled & drained) Broccoli (cooked, boiled & drained, or raw & chopped) Brussel sprouts (cooked, or boiled & drained) *serving size only =  cup Lettuce, raw (green leaf, endive, romaine) Spinach, raw Turnip greens, raw & chopped   These websites have more information on Coumadin (warfarin):  http://www.king-russell.com/; https://www.hines.net/;   Discharge Instructions:  1. You may shower, please wash incisions daily with soap and water and keep dry.  If you wish to cover wounds with dressing you may do so but please keep clean and change daily.  No tub baths or swimming until incisions have completely healed.  If your incisions become red or develop any drainage please call our office at [phone]  2. No Driving until cleared by our office and you are no longer using narcotic pain medications  3. Monitor your weight daily.. Please use the same scale and weigh at same time... If you gain 3-5 lbs in 48 hours with associated lower extremity swelling, please contact our office at [phone]  4. Fever of 101.5 for at least 24 hours with no source, please contact our office at [phone]  5. Activity- up as tolerated, please walk at least 3 times per day.  Avoid strenuous activity, no lifting, pushing, or pulling with your arms over 8-10 lbs for a minimum of 6 weeks  6. If any questions or concerns arise,  please do not hesitate to contact our office at [phone]

## 2020-01-07 NOTE — Progress Notes (Addendum)
      [address]       [place], [address]             [phone]      20 Days Post-Op Procedure(s) (LRB): VIDEO ASSISTED THORACOSCOPY (VATS)/EMPYEMA (Right) Subjective: Feels okay this morning. Only complaint is a lingering cough  Objective: Vital signs in last 24 hours: Temp:  [97.7 F (36.5 C)-98.6 F (37 C)] 98.6 F (37 C) (02/24 0317) Pulse Rate:  [88-110] 100 (02/24 0317) Cardiac Rhythm: Sinus tachycardia (02/23 1901) Resp:  [17-20] 18 (02/24 0317) BP: (95-109)/(67-81) 109/80 (02/24 0317) SpO2:  [95 %-98 %] 95 % (02/24 0317) Weight:  [86.5 kg] 86.5 kg (02/23 0945)     Intake/Output from previous day: 02/23 0701 - 02/24 0700 In: 250 [P.O.:240; I.V.:10] Out: 850 [Urine:850] Intake/Output this shift: No intake/output data recorded.  General appearance: alert, cooperative and no distress Heart: regular rate and rhythm, S1, S2 normal, no murmur, click, rub or gallop Lungs: coarse breath sounds Abdomen: soft, non-tender; bowel sounds normal; no masses,  no organomegaly Extremities: extremities normal, atraumatic, no cyanosis or edema Wound: clean and dry  Lab Results: Recent Labs    01/05/20 0421  WBC 15.2*  HGB 9.0*  HCT 28.7*  PLT 423*   BMET:  Recent Labs    01/05/20 0421  NA 136  K 4.1  CL 100  CO2 24  GLUCOSE 107*  BUN 18  CREATININE 1.02  CALCIUM 8.9    PT/INR: No results for input(s): LABPROT, INR in the last 72 hours. ABG    Component Value Date/Time   PHART 7.442 12/19/2019 1049   HCO3 25.0 12/19/2019 1049   TCO2 26 12/19/2019 1049   ACIDBASEDEF 4.1 (H) 11/09/2019 2210   O2SAT 98.0 12/19/2019 1049   CBG (last 3)  Recent Labs    01/06/20 1634 01/06/20 2107 01/07/20 0613  GLUCAP 130* 100* 105*    Assessment/Plan: S/P Procedure(s) (LRB): VIDEO ASSISTED THORACOSCOPY (VATS)/EMPYEMA (Right)  1. CV- NSR in the 80s, BP well controlled. 2. Pulm-tolerating room air with excellent saturation. CXR stable this  morning. Continue incentive spirometer 3. Renal-creatinine 1.02, electrolytes okay 4. H and H 9.0/28.7, expected acute blood loss anemia 5. Blood glucose well controlled.   Plan: okay to remove posterior chest tube suture and interrupted nylon sutures. Only issue is a lingering cough. Discharge instructions given.      LOS: 57 days    Sharlene Dory 01/07/2020  patient examined and medical record reviewed,agree with above note. Kathlee Nations Trigt III 01/07/2020

## 2020-01-07 NOTE — Telephone Encounter (Signed)
Patient had been scheduled for [facility] follow up, but remained in the hospital during his antibiotic therapy. Per Dr Luciana Axe, patient no longer needs ID followup, especially considering his transportation difficulties, but we are available if needed. Andree Coss, RN

## 2020-01-07 NOTE — TOC Transition Note (Addendum)
Transition of Care [facility]) - CM/SW Discharge Note   Patient Details  Name: DELANE WESSINGER MRN: [id number] Date of Birth: 1966-03-27  Transition of Care [facility]) CM/SW Contact:  Leone Haven, RN Phone Number: 01/07/2020, 9:11 AM   Clinical Narrative:    Patient is for dc today, NCM spoke with patient regarding charity HHPT, he states he needs to check with his wife.  He states he has decided he does not want HHPT, but he wants the rolling walker and 3 n 1 for charity.  NCM made referral to [facility] with [employer] for DME for charity. He will bring to patient room prior to dc. Patient will get his meds from the clinic he goes to in [place] for free. NCM received call from wife states now he has changed his mind and would like to have HHPT for charity.  NCM made referral to Tiffany with [facility], she states she can take this referral, she will call patient to get his financial information.   Final next level of care: Home/Self Care Barriers to Discharge: No Barriers Identified   Patient Goals and CMS Choice Patient states their goals for this hospitalization and ongoing recovery are:: go home      Discharge Placement                       Discharge Plan and Services In-house Referral: Clinical Social Work              DME Arranged: 3-N-1, Walker rolling DME Agency: [employer] Date DME Agency Contacted: 01/07/20 Time DME Agency Contacted: [phone] Representative spoke with at DME Agency: zach HH Arranged: NA          Social Determinants of Health (SDOH) Interventions     Readmission Risk Interventions Readmission Risk Prevention Plan 11/21/2019  Transportation Screening Complete  Medication Review (RN CM) Complete  Some recent data might be hidden

## 2020-01-08 ENCOUNTER — Ambulatory Visit: Payer: Medicaid Other | Admitting: Gerontology

## 2020-01-08 DIAGNOSIS — E1142 Type 2 diabetes mellitus with diabetic polyneuropathy: Secondary | ICD-10-CM

## 2020-01-08 DIAGNOSIS — E781 Pure hyperglyceridemia: Secondary | ICD-10-CM

## 2020-01-08 DIAGNOSIS — R059 Cough, unspecified: Secondary | ICD-10-CM | POA: Insufficient documentation

## 2020-01-08 DIAGNOSIS — R0602 Shortness of breath: Secondary | ICD-10-CM | POA: Insufficient documentation

## 2020-01-08 DIAGNOSIS — R05 Cough: Secondary | ICD-10-CM

## 2020-01-08 DIAGNOSIS — I10 Essential (primary) hypertension: Secondary | ICD-10-CM

## 2020-01-08 DIAGNOSIS — Z09 Encounter for follow-up examination after completed treatment for conditions other than malignant neoplasm: Secondary | ICD-10-CM

## 2020-01-08 LAB — CULTURE, FUNGUS WITHOUT SMEAR

## 2020-01-08 MED ORDER — ATORVASTATIN CALCIUM 10 MG PO TABS: 10.0000 mg | ORAL_TABLET | Freq: Every evening | ORAL | 0 refills | Status: DC

## 2020-01-08 MED ORDER — LISINOPRIL 20 MG PO TABS: 20.0000 mg | ORAL_TABLET | Freq: Every day | ORAL | 1 refills | Status: DC

## 2020-01-08 MED ORDER — BENZONATATE 100 MG PO CAPS: 100.0000 mg | ORAL_CAPSULE | Freq: Three times a day (TID) | ORAL | 0 refills | Status: DC | PRN

## 2020-01-08 MED ORDER — METFORMIN HCL 500 MG PO TABS: 500.0000 mg | ORAL_TABLET | Freq: Every day | ORAL | 0 refills | Status: DC

## 2020-01-08 MED ORDER — ALBUTEROL SULFATE HFA 108 (90 BASE) MCG/ACT IN AERS: 2.0000 | INHALATION_SPRAY | Freq: Four times a day (QID) | RESPIRATORY_TRACT | 0 refills | Status: DC | PRN

## 2020-01-08 NOTE — Progress Notes (Signed)
Established Patient Office Visit  Subjective:  Patient ID: Nathaniel Webb, male    DOB: 09/21/1966  Age: 54 y.o. MRN: [id number]  CC:  Chief Complaint  Patient presents with  . hospital followup    2 months in hospital for Covic   Patient consents to telephone visit and 2 patient identifiers was used to identify patient. HPI Nathaniel Webb presents for follow up after hospital discharge and medication refill. He was admitted and treated for COVID 19 symptoms from 11/09/2019 to 01/07/2020. He continues to experience intermittent shortness of breath with walking around inside the house, and resting relieves symptoms. He also states that he continues to experience intermittent productive cough with whitish phlegm and it wakes him up sometimes at night. He denies chest pain, palpitation, light headedness, fever and chills. Overall, he states that he's doing well and glad to be home. He offers no further complaint.  Past Medical History:  Diagnosis Date  . Diabetes mellitus without complication (HCC)   . Heel pain 06/09/2015  . High triglycerides 05/09/2016  . Hypertension   . Low HDL (under 40) 05/09/2016  . Obesity, Class II, BMI 35-39.9, with comorbidity (HCC) 01/04/2016    Past Surgical History:  Procedure Laterality Date  . VIDEO ASSISTED THORACOSCOPY (VATS)/EMPYEMA Right 12/18/2019   Procedure: VIDEO ASSISTED THORACOSCOPY (VATS)/EMPYEMA;  Surgeon: Kerin Perna, MD;  Location: [facility] OR;  Service: Thoracic;  Laterality: Right;    Family History  Problem Relation Age of Onset  . Diabetes Father   . COPD Mother     Social History   Socioeconomic History  . Marital status: Single    Spouse name: Not on file  . Number of children: Not on file  . Years of education: Not on file  . Highest education level: Not on file  Occupational History  . Not on file  Tobacco Use  . Smoking status: Former Games developer  . Smokeless tobacco: Never Used  Substance and Sexual Activity  . Alcohol  use: No    Alcohol/week: 0.0 standard drinks  . Drug use: No  . Sexual activity: Yes    Partners: Female    Birth control/protection: None  Other Topics Concern  . Not on file  Social History Narrative  . Not on file   Social Determinants of Health   Financial Resource Strain:   . Difficulty of Paying Living Expenses: Not on file  Food Insecurity:   . Worried About Programme researcher, broadcasting/film/video in the Last Year: Not on file  . Ran Out of Food in the Last Year: Not on file  Transportation Needs:   . Lack of Transportation (Medical): Not on file  . Lack of Transportation (Non-Medical): Not on file  Physical Activity:   . Days of Exercise per Week: Not on file  . Minutes of Exercise per Session: Not on file  Stress:   . Feeling of Stress : Not on file  Social Connections:   . Frequency of Communication with Friends and Family: Not on file  . Frequency of Social Gatherings with Friends and Family: Not on file  . Attends Religious Services: Not on file  . Active Member of Clubs or Organizations: Not on file  . Attends Banker Meetings: Not on file  . Marital Status: Not on file  Intimate Partner Violence:   . Fear of Current or Ex-Partner: Not on file  . Emotionally Abused: Not on file  . Physically Abused: Not on file  .  Sexually Abused: Not on file    Outpatient Medications Prior to Visit  Medication Sig Dispense Refill  . acetaminophen (TYLENOL) 325 MG tablet Take 2 tablets (650 mg total) by mouth every 6 (six) hours as needed for mild pain or headache (fever >/= 101).    Marland Kitchen amoxicillin-clavulanate (AUGMENTIN) 875-125 MG tablet Take 1 tablet by mouth every 12 (twelve) hours. 8 tablet 0  . aspirin EC 81 MG tablet Take 81 mg by mouth daily.    . Omega-3 Fatty Acids (FISH OIL) 1000 MG CAPS Take 1,000 mg by mouth daily.    Marland Kitchen oxycodone (OXY-IR) 5 MG capsule Take 1 capsule (5 mg total) by mouth every 6 (six) hours as needed. 30 capsule 0  . atorvastatin (LIPITOR) 10 MG tablet  Take 10 mg by mouth every evening.    Marland Kitchen lisinopril (ZESTRIL) 20 MG tablet Take 20 mg by mouth daily.    . metFORMIN (GLUCOPHAGE) 500 MG tablet Take 500 mg by mouth daily with breakfast.    . insulin aspart (NOVOLOG) 100 UNIT/ML injection Inject 0-9 Units into the skin every 4 (four) hours. (Patient not taking: Reported on 01/08/2020) 10 mL 11   No facility-administered medications prior to visit.    No Known Allergies  ROS Review of Systems  Constitutional: Negative.   Respiratory: Positive for cough (productive cough) and shortness of breath.   Cardiovascular: Negative.   Skin: Negative.   Neurological: Negative.   Psychiatric/Behavioral: Negative.       Objective:    Physical Exam No Physical exam was done There were no vitals taken for this visit. Wt Readings from Last 3 Encounters:  01/06/20 190 lb 11.2 oz (86.5 kg)  11/09/19 234 lb (106.1 kg)  11/05/19 234 lb (106.1 kg)     Health Maintenance Due  Topic Date Due  . COLONOSCOPY  10/17/2017  . OPHTHALMOLOGY EXAM  08/30/2019  . FOOT EXAM  11/21/2019    There are no preventive care reminders to display for this patient.  Lab Results  Component Value Date   TSH 1.320 01/24/2018   Lab Results  Component Value Date   WBC 15.2 (H) 01/05/2020   HGB 9.0 (L) 01/05/2020   HCT 28.7 (L) 01/05/2020   MCV 84.4 01/05/2020   PLT 423 (H) 01/05/2020   Lab Results  Component Value Date   NA 136 01/05/2020   K 4.1 01/05/2020   CO2 24 01/05/2020   GLUCOSE 107 (H) 01/05/2020   BUN 18 01/05/2020   CREATININE 1.02 01/05/2020   BILITOT 0.5 12/20/2019   ALKPHOS 94 12/20/2019   AST 28 12/20/2019   ALT 15 12/20/2019   PROT 5.7 (L) 12/20/2019   ALBUMIN 1.8 (L) 12/20/2019   CALCIUM 8.9 01/05/2020   ANIONGAP 12 01/05/2020   Lab Results  Component Value Date   CHOL 107 02/12/2019   Lab Results  Component Value Date   HDL 24 (L) 02/12/2019   Lab Results  Component Value Date   LDLCALC 61 02/12/2019   Lab Results   Component Value Date   TRIG 227 (H) 12/19/2019   Lab Results  Component Value Date   CHOLHDL 4.5 02/12/2019   Lab Results  Component Value Date   HGBA1C 7.3 (H) 11/10/2019      Assessment & Plan:    1. Cough - He will start on Tessalon Perles as needed, he was educated on medication side effect and was advised to notify the clinic. - benzonatate (TESSALON PERLES) 100 MG capsule; Take  1 capsule (100 mg total) by mouth 3 (three) times daily as needed for cough.  Dispense: 20 capsule; Refill: 0  2. Shortness of breath - He will continue on Albuterol as needed and was educated on medication side effect and was advised to notify the clinic. - albuterol (VENTOLIN HFA) 108 (90 Base) MCG/ACT inhaler; Inhale 2 puffs into the lungs every 6 (six) hours as needed for wheezing or shortness of breath.  Dispense: 18 g; Refill: 0  3. Hospital discharge follow-up - Routine labs will be rechecked. - CBC w/Diff; Future - HgB A1c; Future  4. Type 2 diabetes, controlled, with peripheral neuropathy (HCC) - He will continue on Metformin, was advised to continue on low carb/non concentrated sweet diet. - metFORMIN (GLUCOPHAGE) 500 MG tablet; Take 1 tablet (500 mg total) by mouth daily with breakfast.  Dispense: 90 tablet; Refill: 0  5. Essential hypertension - He will continue on current treatment regimen, was advised to check blood pressure at least weekly, record and bring log to follow up appointment. He was also advised to continue on DASH diet. - lisinopril (ZESTRIL) 20 MG tablet; Take 1 tablet (20 mg total) by mouth daily.  Dispense: 90 tablet; Refill: 1  6. High triglycerides - He will continue on Atorvastatin and was advised to continue on low fat/cholesterol diet. - atorvastatin (LIPITOR) 10 MG tablet; Take 1 tablet (10 mg total) by mouth every evening.  Dispense: 90 tablet; Refill: 0    Follow-up: Return in about 1 month (around 02/05/2020), or if symptoms worsen or fail to improve.     Kenise Barraco Trellis Paganini, NP

## 2020-01-15 ENCOUNTER — Other Ambulatory Visit: Payer: Self-pay

## 2020-01-15 ENCOUNTER — Ambulatory Visit (INDEPENDENT_AMBULATORY_CARE_PROVIDER_SITE_OTHER): Payer: Self-pay

## 2020-01-15 VITALS — Temp 96.6°F

## 2020-01-15 DIAGNOSIS — J869 Pyothorax without fistula: Secondary | ICD-10-CM

## 2020-01-15 DIAGNOSIS — Z4802 Encounter for removal of sutures: Secondary | ICD-10-CM

## 2020-01-15 NOTE — Progress Notes (Signed)
Pt comes in today for suture removal.  He is s/p R VATS on 12/18/19. He is a/o x 4 and w/o complaint. One larger incision to R lateral back w/ 3 sutures c/d/I.  Smaller incision upper, medial to this has one suture that's intact.  Both incisions are well approximated and without s/s of infection. Sutures removed per standard protocol w/o difficulty. Instructed pt to keep sites clean (w/ mild soap and water) and dry. To notify office of any s/s of infection or other problems.

## 2020-01-19 ENCOUNTER — Other Ambulatory Visit: Payer: Self-pay | Admitting: Cardiothoracic Surgery

## 2020-01-19 DIAGNOSIS — J869 Pyothorax without fistula: Secondary | ICD-10-CM

## 2020-01-20 LAB — FUNGAL ORGANISM REFLEX

## 2020-01-20 LAB — FUNGUS CULTURE WITH STAIN

## 2020-01-20 LAB — FUNGUS CULTURE RESULT

## 2020-01-21 ENCOUNTER — Other Ambulatory Visit: Payer: Self-pay

## 2020-01-21 ENCOUNTER — Encounter: Payer: Self-pay | Admitting: Cardiothoracic Surgery

## 2020-01-21 ENCOUNTER — Ambulatory Visit
Admission: RE | Admit: 2020-01-21 | Discharge: 2020-01-21 | Disposition: A | Discharge status: Home / Self Care | Payer: Medicaid Other | Source: Ambulatory Visit | Attending: Cardiothoracic Surgery | Admitting: Cardiothoracic Surgery

## 2020-01-21 ENCOUNTER — Ambulatory Visit (INDEPENDENT_AMBULATORY_CARE_PROVIDER_SITE_OTHER): Payer: Self-pay | Admitting: Cardiothoracic Surgery

## 2020-01-21 DIAGNOSIS — J869 Pyothorax without fistula: Secondary | ICD-10-CM

## 2020-01-21 DIAGNOSIS — Z09 Encounter for follow-up examination after completed treatment for conditions other than malignant neoplasm: Secondary | ICD-10-CM | POA: Insufficient documentation

## 2020-01-21 MED ORDER — HYDROCOD POLST-CPM POLST ER 10-8 MG/5ML PO SUER: 5.0000 mL | Freq: Every evening | ORAL | 0 refills | Status: DC | PRN

## 2020-01-21 NOTE — Progress Notes (Signed)
PCP is Rolm Gala, NP Referring Provider is Lynnell Catalan, MD  Chief Complaint  Patient presents with  . Routine Post Op    s/p VATS R empyema 12/18/19    HPI: Postoperative office visit a month after right VATS, drainage of loculated effusion and decortication for complications of Covid infection.  He was hospitalized for over 2 months and remains very weak but is receiving home physical therapy.  He is not smoking.  He is not on oxygen.  He reports for his visit today in a wheelchair which he uses for long distances.  He has a dry cough for which Tessalon Perles are ineffective.  This is an obstacle to his night sleep.  He has typical post thoracotomy discomfort and is not taking narcotics.  No fever Overall improved appetite energy and exercise tolerance and pain.   Past Medical History:  Diagnosis Date  . Diabetes mellitus without complication (HCC)   . Heel pain 06/09/2015  . High triglycerides 05/09/2016  . Hypertension   . Low HDL (under 40) 05/09/2016  . Obesity, Class II, BMI 35-39.9, with comorbidity (HCC) 01/04/2016    Past Surgical History:  Procedure Laterality Date  . VIDEO ASSISTED THORACOSCOPY (VATS)/EMPYEMA Right 12/18/2019   Procedure: VIDEO ASSISTED THORACOSCOPY (VATS)/EMPYEMA;  Surgeon: Kerin Perna, MD;  Location: [facility] OR;  Service: Thoracic;  Laterality: Right;    Family History  Problem Relation Age of Onset  . Diabetes Father   . COPD Mother     Social History Social History   Tobacco Use  . Smoking status: Former Games developer  . Smokeless tobacco: Never Used  Substance Use Topics  . Alcohol use: No    Alcohol/week: 0.0 standard drinks  . Drug use: No    Current Outpatient Medications  Medication Sig Dispense Refill  . acetaminophen (TYLENOL) 325 MG tablet Take 2 tablets (650 mg total) by mouth every 6 (six) hours as needed for mild pain or headache (fever >/= 101).    Marland Kitchen albuterol (VENTOLIN HFA) 108 (90 Base) MCG/ACT inhaler Inhale 2  puffs into the lungs every 6 (six) hours as needed for wheezing or shortness of breath. 18 g 0  . amoxicillin-clavulanate (AUGMENTIN) 875-125 MG tablet Take 1 tablet by mouth every 12 (twelve) hours. 8 tablet 0  . aspirin EC 81 MG tablet Take 81 mg by mouth daily.    Marland Kitchen atorvastatin (LIPITOR) 10 MG tablet Take 1 tablet (10 mg total) by mouth every evening. 90 tablet 0  . benzonatate (TESSALON PERLES) 100 MG capsule Take 1 capsule (100 mg total) by mouth 3 (three) times daily as needed for cough. 20 capsule 0  . insulin aspart (NOVOLOG) 100 UNIT/ML injection Inject 0-9 Units into the skin every 4 (four) hours. 10 mL 11  . lisinopril (ZESTRIL) 20 MG tablet Take 1 tablet (20 mg total) by mouth daily. 90 tablet 1  . metFORMIN (GLUCOPHAGE) 500 MG tablet Take 1 tablet (500 mg total) by mouth daily with breakfast. 90 tablet 0  . Omega-3 Fatty Acids (FISH OIL) 1000 MG CAPS Take 1,000 mg by mouth daily.    Marland Kitchen oxycodone (OXY-IR) 5 MG capsule Take 1 capsule (5 mg total) by mouth every 6 (six) hours as needed. 30 capsule 0   No current facility-administered medications for this visit.    No Known Allergies  Review of Systems  As noted above  BP (!) 136/91 (BP Location: Left Arm, Patient Position: Sitting, Cuff Size: Normal)   Pulse 92  Temp 97.9 F (36.6 C) (Temporal)   Resp 20   Ht 5\' 5"  (1.651 m)   Wt 201 lb (91.2 kg)   SpO2 94% Comment: RA  BMI 33.45 kg/m  Physical Exam      Exam    General- alert and comfortable.  Right VATS incision well-healed.    Neck- no JVD, no cervical adenopathy palpable, no carotid bruit   Lungs- clear without rales, wheezes   Cor- regular rate and rhythm, no murmur , gallop   Abdomen- soft, non-tender   Extremities - warm, non-tender, minimal edema   Neuro- oriented, appropriate, no focal weakness   Diagnostic Tests: Chest x-ray shows fluid filling the space from his entrapped right lung which will not improve.  No evidence of pneumonia.  No  pneumothorax.  Impression: Patient is recovering from right VATS for post Covid pneumonia and loculated effusion with entrapment of right lung from a very thick peel which could not be completely removed.  He will have pleural thickening and some fluid chronically.  Plan: Return with chest x-ray in 6 weeks.  Prescription for Tussionex sent to his pharmacy. He is encouraged to walk 10 minutes twice a day to improve his conditioning.  Len Childs, MD [facility] [phone]

## 2020-01-22 ENCOUNTER — Telehealth: Payer: Self-pay

## 2020-01-22 ENCOUNTER — Other Ambulatory Visit: Payer: Self-pay | Admitting: Surgical

## 2020-01-22 MED ORDER — HYDROCOD POLST-CPM POLST ER 10-8 MG/5ML PO SUER: 5.0000 mL | Freq: Every evening | ORAL | Status: DC | PRN

## 2020-01-22 NOTE — Telephone Encounter (Signed)
Pt's wife calls to report that the Tussionex cough syrup that Dr. Donata Clay ordered at pt's OV yesterday was not at his pharmacy. TC to Medication Management; pharmacist states they did not get the Rx, but they would be unable to fill it anyway because they do not dispense controlled substances. Webb Laws, PA notified of current situation and e-prescribes Tussionex Rx to [facility] in [place] per pt's request. Wife notified.

## 2020-01-23 ENCOUNTER — Other Ambulatory Visit: Payer: Self-pay | Admitting: Physician Assistant

## 2020-01-23 MED ORDER — HYDROCOD POLST-CPM POLST ER 10-8 MG/5ML PO SUER: 5.0000 mL | Freq: Two times a day (BID) | ORAL | 0 refills | Status: AC | PRN

## 2020-01-30 LAB — ACID FAST CULTURE WITH REFLEXED SENSITIVITIES (MYCOBACTERIA)
Acid Fast Culture: NEGATIVE
Acid Fast Culture: NEGATIVE

## 2020-02-05 ENCOUNTER — Other Ambulatory Visit: Payer: Self-pay

## 2020-02-05 ENCOUNTER — Ambulatory Visit: Payer: Medicaid Other | Admitting: Gerontology

## 2020-02-05 ENCOUNTER — Encounter: Payer: Self-pay | Admitting: Gerontology

## 2020-02-05 VITALS — BP 141/95 | HR 86 | Temp 97.4°F | Ht 65.0 in | Wt 203.1 lb

## 2020-02-05 DIAGNOSIS — I10 Essential (primary) hypertension: Secondary | ICD-10-CM

## 2020-02-05 DIAGNOSIS — E119 Type 2 diabetes mellitus without complications: Secondary | ICD-10-CM

## 2020-02-05 DIAGNOSIS — R05 Cough: Secondary | ICD-10-CM

## 2020-02-05 DIAGNOSIS — E1142 Type 2 diabetes mellitus with diabetic polyneuropathy: Secondary | ICD-10-CM

## 2020-02-05 DIAGNOSIS — E781 Pure hyperglyceridemia: Secondary | ICD-10-CM

## 2020-02-05 DIAGNOSIS — R059 Cough, unspecified: Secondary | ICD-10-CM

## 2020-02-05 LAB — POCT GLYCOSYLATED HEMOGLOBIN (HGB A1C): Hemoglobin A1C: 5.5 % (ref 4.0–5.6)

## 2020-02-05 MED ORDER — METFORMIN HCL 500 MG PO TABS: 500.0000 mg | ORAL_TABLET | Freq: Every day | ORAL | 0 refills | Status: DC

## 2020-02-05 MED ORDER — ATORVASTATIN CALCIUM 10 MG PO TABS: 10.0000 mg | ORAL_TABLET | Freq: Every evening | ORAL | 0 refills | Status: DC

## 2020-02-05 NOTE — Patient Instructions (Signed)
Managing Your Hypertension Hypertension is commonly called high blood pressure. This is when the force of your blood pressing against the walls of your arteries is too strong. Arteries are blood vessels that carry blood from your heart throughout your body. Hypertension forces the heart to work harder to pump blood, and may cause the arteries to become narrow or stiff. Having untreated or uncontrolled hypertension can cause heart attack, stroke, kidney disease, and other problems. What are blood pressure readings? A blood pressure reading consists of a higher number over a lower number. Ideally, your blood pressure should be below 120/80. The first ("top") number is called the systolic pressure. It is a measure of the pressure in your arteries as your heart beats. The second ("bottom") number is called the diastolic pressure. It is a measure of the pressure in your arteries as the heart relaxes. What does my blood pressure reading mean? Blood pressure is classified into four stages. Based on your blood pressure reading, your health care provider may use the following stages to determine what type of treatment you need, if any. Systolic pressure and diastolic pressure are measured in a unit called mm Hg. Normal  Systolic pressure: below 120.  Diastolic pressure: below 80. Elevated  Systolic pressure: 120-129.  Diastolic pressure: below 80. Hypertension stage 1  Systolic pressure: 130-139.  Diastolic pressure: 80-89. Hypertension stage 2  Systolic pressure: 140 or above.  Diastolic pressure: 90 or above. What health risks are associated with hypertension? Managing your hypertension is an important responsibility. Uncontrolled hypertension can lead to:  A heart attack.  A stroke.  A weakened blood vessel (aneurysm).  Heart failure.  Kidney damage.  Eye damage.  Metabolic syndrome.  Memory and concentration problems. What changes can I make to manage my  hypertension? Hypertension can be managed by making lifestyle changes and possibly by taking medicines. Your health care provider will help you make a plan to bring your blood pressure within a normal range. Eating and drinking   Eat a diet that is high in fiber and potassium, and low in salt (sodium), added sugar, and fat. An example eating plan is called the DASH (Dietary Approaches to Stop Hypertension) diet. To eat this way: ? Eat plenty of fresh fruits and vegetables. Try to fill half of your plate at each meal with fruits and vegetables. ? Eat whole grains, such as whole wheat pasta, brown rice, or whole grain bread. Fill about one quarter of your plate with whole grains. ? Eat low-fat diary products. ? Avoid fatty cuts of meat, processed or cured meats, and poultry with skin. Fill about one quarter of your plate with lean proteins such as fish, chicken without skin, beans, eggs, and tofu. ? Avoid premade and processed foods. These tend to be higher in sodium, added sugar, and fat.  Reduce your daily sodium intake. Most people with hypertension should eat less than 1,500 mg of sodium a day.  Limit alcohol intake to no more than 1 drink a day for nonpregnant women and 2 drinks a day for men. One drink equals 12 oz of beer, 5 oz of wine, or 1 oz of hard liquor. Lifestyle  Work with your health care provider to maintain a healthy body weight, or to lose weight. Ask what an ideal weight is for you.  Get at least 30 minutes of exercise that causes your heart to beat faster (aerobic exercise) most days of the week. Activities may include walking, swimming, or biking.  Include exercise   to strengthen your muscles (resistance exercise), such as weight lifting, as part of your weekly exercise routine. Try to do these types of exercises for 30 minutes at least 3 days a week.  Do not use any products that contain nicotine or tobacco, such as cigarettes and e-cigarettes. If you need help quitting,  ask your health care provider.  Control any long-term (chronic) conditions you have, such as high cholesterol or diabetes. Monitoring  Monitor your blood pressure at home as told by your health care provider. Your personal target blood pressure may vary depending on your medical conditions, your age, and other factors.  Have your blood pressure checked regularly, as often as told by your health care provider. Working with your health care provider  Review all the medicines you take with your health care provider because there may be side effects or interactions.  Talk with your health care provider about your diet, exercise habits, and other lifestyle factors that may be contributing to hypertension.  Visit your health care provider regularly. Your health care provider can help you create and adjust your plan for managing hypertension. Will I need medicine to control my blood pressure? Your health care provider may prescribe medicine if lifestyle changes are not enough to get your blood pressure under control, and if:  Your systolic blood pressure is 130 or higher.  Your diastolic blood pressure is 80 or higher. Take medicines only as told by your health care provider. Follow the directions carefully. Blood pressure medicines must be taken as prescribed. The medicine does not work as well when you skip doses. Skipping doses also puts you at risk for problems. Contact a health care provider if:  You think you are having a reaction to medicines you have taken.  You have repeated (recurrent) headaches.  You feel dizzy.  You have swelling in your ankles.  You have trouble with your vision. Get help right away if:  You develop a severe headache or confusion.  You have unusual weakness or numbness, or you feel faint.  You have severe pain in your chest or abdomen.  You vomit repeatedly.  You have trouble breathing. Summary  Hypertension is when the force of blood pumping  through your arteries is too strong. If this condition is not controlled, it may put you at risk for serious complications.  Your personal target blood pressure may vary depending on your medical conditions, your age, and other factors. For most people, a normal blood pressure is less than 120/80.  Hypertension is managed by lifestyle changes, medicines, or both. Lifestyle changes include weight loss, eating a healthy, low-sodium diet, exercising more, and limiting alcohol. This information is not intended to replace advice given to you by your health care provider. Make sure you discuss any questions you have with your health care provider. Document Revised: 02/21/2019 Document Reviewed: 09/27/2016 Elsevier Patient Education  2020 [employer].  Diabetes Action Plan Following a diabetes action plan is a way for you to manage your diabetes (diabetes mellitus) symptoms. The plan is color-coded to help you understand what actions you need to take based on any symptoms you are having.  If you have symptoms in the red zone, you need medical care right away.  If you have symptoms in the yellow zone, it means you are having problems.  If you have symptoms in the green zone, you are doing well. Learning about and understanding diabetes can take time. Follow the plan that you develop with your health care  provider. Know the target range for your blood sugar (glucose) level, and review your treatment plan with your health care provider at each visit. The target range for my blood sugar level is __________________________ mg/dL. Red zone Get medical help right away if you have any of the following symptoms:  A blood sugar test result that is below 54 mg/dL (3 mmol/L).  A blood sugar test result that is at or above 240 mg/dL (13.3 mmol/L) for 2 days in a row.  Confusion or trouble thinking clearly.  Difficulty breathing.  Sickness or a fever for 2 or more days that is not getting  better.  Moderate or large ketone levels in your urine. If you have any red zone symptoms, call emergency services (911 in the [place] or go to the nearest emergency room. If you have severely low blood sugar (severe hypoglycemia) and you cannot eat or drink, you may need an injection of glucagon. Make sure a family member or close friend knows how to check your blood sugar and how to give you a glucagon injection. You may need to be treated in a hospital for this condition. Yellow zone If you have any of the following symptoms, your diabetes is not under control and you may need to make some changes:  Blood sugar test results that are below 70 mg/dL (3.9 mmol/L).  Other symptoms of hypoglycemia, such as: ? Shaking or feeling light-headed. ? Confusion or irritability. ? Feeling hungry. ? Having a fast heartbeat.  A blood sugar test result that is higher than 240 mg/dL (13.3 mmol/L) for 2 days in a row.  A fever.  Feeling tired, or not having any energy. If you have any yellow zone symptoms:  Treat your low blood sugar (hypoglycemia) by eating or drinking 15 grams of a rapid-acting carbohydrate. Follow the 15:15 rule: ? Take 15 grams of a rapid-acting carbohydrate, such as:  1 tube of glucose gel.  3 glucose pills.  6-8 pieces of hard candy.  4 oz (120 mL) of fruit juice.  4 oz (120 mL) of regular (not diet) soda. ? Check your blood sugar 15 minutes after you take the carbohydrate. ? If the repeat blood sugar test is still at or below 70 mg/dL (3.9 mmol/L), take 15 grams of a carbohydrate again. ? If your blood sugar does not increase above 70 mg/dL (3.9 mmol/L) after 3 tries, get medical help right away. ? After your blood sugar returns to normal, eat a meal or a snack within 1 hour.  Keep taking your daily medicines as directed.  Check your blood sugar more often than you normally would. ? Write down your results. ? Call your health care provider if you have trouble keeping  your blood sugar in your target range.  Green zone These signs mean you are doing well and you can continue what you are doing to manage your diabetes:  Your blood sugar is within your personal target range. For most people, a blood sugar level before a meal (preprandial) should be 80-130 mg/dL.  You feel well, and you are able to do daily activities. If you are in the green zone, continue to manage your diabetes as directed. To do this:  Eat a healthy diet.  Exercise regularly.  Check your blood sugar as directed.  Take your medicines as directed.  Where to find more information You can find more information about diabetes from:  American Diabetes Association (ADA): www.diabetes.org  American Association of Diabetes Educators (AADE): www.diabeteseducator.org  Summary  Following a diabetes action plan is a way for you to manage your diabetes symptoms. The plan is color-coded to help you understand what actions you need to take based on any symptoms you are having.  Follow the plan that you develop with your health care provider. Make sure you know your personal target blood sugar level.  Review your treatment plan with your health care provider at each visit. This information is not intended to replace advice given to you by your health care provider. Make sure you discuss any questions you have with your health care provider. Document Revised: 12/28/2017 Document Reviewed: 08/22/2017 Elsevier Patient Education  2020 [employer].

## 2020-02-05 NOTE — Progress Notes (Signed)
Established Patient Office Visit  Subjective:  Patient ID: Nathaniel Webb, male    DOB: 1966/06/26  Age: 54 y.o. MRN: [id number]  CC:  Chief Complaint  Patient presents with  . Hypertension    HPI Nathaniel Webb presents for follow up of hypertension, and diabetes. He reports that he checks his blood pressure once a week and it's usually less than 140/90. He states that he's compliant with his medications, continues to make healthy life style choices and exercising with Physical Therapist. His HgbA1c done during his visit decreased from 7.3% to 5.5%. He reports checking his blood glucose every 4 days and his fasting reading was 104 mg/dl. He denies hypo/hyperglycemic symptoms and performs daily foot checks. He was seen by the Cardiac and Thoracic Surgeon Dr Kathlee Nations Trigt III for right VATS post Covid pneumonia on 01/21/2020 and chest X ray was done. It showed loculated effusion with entrapment of right lung from a very thick peel which could not be completely removed.  He will have pleural thickening and some fluid chronically. He will follow up in 6 weeks for chest . Currently, he states that he continues to experiences intermittent shortness of breath with exertion such as walking for 5-10 minutes and occasional dry cough. He reports that he uses albuterol as needed and Tussionex for cough. He ambulates with a walker and overall, he states that he's doing well and offers no further complaint.  Past Medical History:  Diagnosis Date  . Diabetes mellitus without complication (HCC)   . Heel pain 06/09/2015  . High triglycerides 05/09/2016  . Hypertension   . Low HDL (under 40) 05/09/2016  . Obesity, Class II, BMI 35-39.9, with comorbidity (HCC) 01/04/2016    Past Surgical History:  Procedure Laterality Date  . VIDEO ASSISTED THORACOSCOPY (VATS)/EMPYEMA Right 12/18/2019   Procedure: VIDEO ASSISTED THORACOSCOPY (VATS)/EMPYEMA;  Surgeon: Kerin Perna, MD;  Location: [facility] OR;  Service:  Thoracic;  Laterality: Right;    Family History  Problem Relation Age of Onset  . Diabetes Father   . COPD Mother     Social History   Socioeconomic History  . Marital status: Married    Spouse name: Not on file  . Number of children: Not on file  . Years of education: Not on file  . Highest education level: Not on file  Occupational History  . Not on file  Tobacco Use  . Smoking status: Former Games developer  . Smokeless tobacco: Never Used  Substance and Sexual Activity  . Alcohol use: No    Alcohol/week: 0.0 standard drinks  . Drug use: No  . Sexual activity: Yes    Partners: Female    Birth control/protection: None  Other Topics Concern  . Not on file  Social History Narrative  . Not on file   Social Determinants of Health   Financial Resource Strain:   . Difficulty of Paying Living Expenses:   Food Insecurity:   . Worried About Programme researcher, broadcasting/film/video in the Last Year:   . Barista in the Last Year:   Transportation Needs:   . Freight forwarder (Medical):   Marland Kitchen Lack of Transportation (Non-Medical):   Physical Activity:   . Days of Exercise per Week:   . Minutes of Exercise per Session:   Stress:   . Feeling of Stress :   Social Connections:   . Frequency of Communication with Friends and Family:   . Frequency of Social Gatherings with Friends  and Family:   . Attends Religious Services:   . Active Member of Clubs or Organizations:   . Attends Archivist Meetings:   Marland Kitchen Marital Status:   Intimate Partner Violence:   . Fear of Current or Ex-Partner:   . Emotionally Abused:   Marland Kitchen Physically Abused:   . Sexually Abused:     Outpatient Medications Prior to Visit  Medication Sig Dispense Refill  . acetaminophen (TYLENOL) 325 MG tablet Take 2 tablets (650 mg total) by mouth every 6 (six) hours as needed for mild pain or headache (fever >/= 101).    Marland Kitchen albuterol (VENTOLIN HFA) 108 (90 Base) MCG/ACT inhaler Inhale 2 puffs into the lungs every 6 (six)  hours as needed for wheezing or shortness of breath. 18 g 0  . aspirin EC 81 MG tablet Take 81 mg by mouth daily.    Marland Kitchen lisinopril (ZESTRIL) 20 MG tablet Take 1 tablet (20 mg total) by mouth daily. 90 tablet 1  . Omega-3 Fatty Acids (FISH OIL) 1000 MG CAPS Take 1,000 mg by mouth daily.    Marland Kitchen atorvastatin (LIPITOR) 10 MG tablet Take 1 tablet (10 mg total) by mouth every evening. 90 tablet 0  . metFORMIN (GLUCOPHAGE) 500 MG tablet Take 1 tablet (500 mg total) by mouth daily with breakfast. 90 tablet 0  . amoxicillin-clavulanate (AUGMENTIN) 875-125 MG tablet Take 1 tablet by mouth every 12 (twelve) hours. 8 tablet 0  . benzonatate (TESSALON PERLES) 100 MG capsule Take 1 capsule (100 mg total) by mouth 3 (three) times daily as needed for cough. (Patient not taking: Reported on 02/05/2020) 20 capsule 0  . insulin aspart (NOVOLOG) 100 UNIT/ML injection Inject 0-9 Units into the skin every 4 (four) hours. (Patient not taking: Reported on 02/05/2020) 10 mL 11  . oxycodone (OXY-IR) 5 MG capsule Take 1 capsule (5 mg total) by mouth every 6 (six) hours as needed. (Patient not taking: Reported on 02/05/2020) 30 capsule 0   No facility-administered medications prior to visit.    No Known Allergies  ROS Review of Systems  Constitutional: Negative.   HENT: Negative.   Eyes: Negative.   Respiratory: Positive for cough (dry cough) and shortness of breath.   Cardiovascular: Negative.   Endocrine: Negative.   Neurological: Negative.   Psychiatric/Behavioral: Negative.       Objective:    Physical Exam  Constitutional: He is oriented to person, place, and time. He appears well-developed.  HENT:  Head: Normocephalic and atraumatic.  Eyes: Pupils are equal, round, and reactive to light. EOM are normal.  Cardiovascular: Normal rate and regular rhythm.  Pulmonary/Chest: Effort normal. He has decreased breath sounds in the right middle field and the right lower field.  Neurological: He is alert and oriented  to person, place, and time.  Psychiatric: He has a normal mood and affect. His behavior is normal. Judgment and thought content normal.    BP (!) 141/95 (BP Location: Left Arm, Patient Position: Sitting)   Pulse 86   Temp (!) 97.4 F (36.3 C)   Ht 5\' 5"  (1.651 m)   Wt 203 lb 1.6 oz (92.1 kg)   SpO2 97%   BMI 33.80 kg/m  Wt Readings from Last 3 Encounters:  02/05/20 203 lb 1.6 oz (92.1 kg)  01/21/20 201 lb (91.2 kg)  01/06/20 190 lb 11.2 oz (86.5 kg)   He gained 14 pounds in one month and was encouraged to continue on a weight loss regimen.  Health Maintenance Due  Topic Date Due  . COLONOSCOPY  Never done  . OPHTHALMOLOGY EXAM  08/30/2019  . FOOT EXAM  11/21/2019    There are no preventive care reminders to display for this patient.  Lab Results  Component Value Date   TSH 1.320 01/24/2018   Lab Results  Component Value Date   WBC 15.2 (H) 01/05/2020   HGB 9.0 (L) 01/05/2020   HCT 28.7 (L) 01/05/2020   MCV 84.4 01/05/2020   PLT 423 (H) 01/05/2020   Lab Results  Component Value Date   NA 136 01/05/2020   K 4.1 01/05/2020   CO2 24 01/05/2020   GLUCOSE 107 (H) 01/05/2020   BUN 18 01/05/2020   CREATININE 1.02 01/05/2020   BILITOT 0.5 12/20/2019   ALKPHOS 94 12/20/2019   AST 28 12/20/2019   ALT 15 12/20/2019   PROT 5.7 (L) 12/20/2019   ALBUMIN 1.8 (L) 12/20/2019   CALCIUM 8.9 01/05/2020   ANIONGAP 12 01/05/2020   Lab Results  Component Value Date   CHOL 107 02/12/2019   Lab Results  Component Value Date   HDL 24 (L) 02/12/2019   Lab Results  Component Value Date   LDLCALC 61 02/12/2019   Lab Results  Component Value Date   TRIG 227 (H) 12/19/2019   Lab Results  Component Value Date   CHOLHDL 4.5 02/12/2019   Lab Results  Component Value Date   HGBA1C 5.5 02/05/2020      Assessment & Plan:   1. Essential hypertension - His blood pressure is not well control, he was encouraged to check blood pressure weekly, record and bring log to  follow up appointment. He will continue on current treatment regimen, DASH diet and exercise as tolerated. His goal blood pressure should be less than 140/90. - CBC w/Diff; Future   2. High triglycerides - He will continue on current treatment, low fat/low cholesterol diet and exercise as tolerated. - Lipid panel; Future - atorvastatin (LIPITOR) 10 MG tablet; Take 1 tablet (10 mg total) by mouth every evening.  Dispense: 90 tablet; Refill: 0  3. Type 2 diabetes, controlled, with peripheral neuropathy (HCC) - His HgbA1c was 5.5 %, he was advised to continue on low carb/ non concentrated sweet diet and exercise as tolerated. - POCT HgB A1C; Future - HgB A1c; Future - metFORMIN (GLUCOPHAGE) 500 MG tablet; Take 1 tablet (500 mg total) by mouth daily with breakfast.  Dispense: 90 tablet; Refill: 0 - Ambulatory referral to Ophthalmology   4. Cough - He was advised to continue on Tussionex and follow up with Cardiac Thoracic Surgeon in 4 weeks. He was advised to notify clinic with worsening symptoms.    Follow-up: Return in about 3 months (around 05/11/2020), or if symptoms worsen or fail to improve.    Nathaniel Korff Trellis Paganini, NP

## 2020-02-12 ENCOUNTER — Other Ambulatory Visit: Payer: Self-pay | Admitting: Gerontology

## 2020-02-12 DIAGNOSIS — R0602 Shortness of breath: Secondary | ICD-10-CM

## 2020-02-12 MED ORDER — ALBUTEROL SULFATE HFA 108 (90 BASE) MCG/ACT IN AERS: 2.0000 | INHALATION_SPRAY | Freq: Four times a day (QID) | RESPIRATORY_TRACT | 2 refills | Status: DC | PRN

## 2020-02-16 ENCOUNTER — Telehealth: Payer: Self-pay | Admitting: Pharmacy Technician

## 2020-02-16 NOTE — Telephone Encounter (Signed)
  Checked [facility] after speaking with patient.  Patient has Medicaid.  Called patient and explained that Medicaid was active and it included prescription coverage.  Patient acknowledged that he is no longer eligible for [facility] services.  Patient asked that prescriptions be transferred to [facility]  Sherilyn Dacosta Care Manager [facility]

## 2020-02-16 NOTE — Telephone Encounter (Signed)
Spoke with patient and left him know that we still need Loretta's, spouse, Administrator, Civil Service for 2021.  Patient stated that he would bring it by 02/20/20 to [facility].  Explained that [facility] cannot provide additional medication assistance without this paperwork.  Patient acknowledged that he understood.  Patient also stated that he was in the hospital for over 2 months.  May get approved to Medicaid within this month.  Explained that once Medicaid went into effect, [facility] would no longer be able to provide medication assistance, because he would have prescription drug coverage.  Patient acknowledged that he understood.  Sherilyn Dacosta Care Manager [facility]

## 2020-03-03 ENCOUNTER — Other Ambulatory Visit: Payer: Self-pay

## 2020-03-03 ENCOUNTER — Ambulatory Visit (INDEPENDENT_AMBULATORY_CARE_PROVIDER_SITE_OTHER): Payer: Self-pay | Admitting: Cardiothoracic Surgery

## 2020-03-03 ENCOUNTER — Ambulatory Visit
Admission: RE | Admit: 2020-03-03 | Discharge: 2020-03-03 | Disposition: A | Discharge status: Home / Self Care | Payer: Medicaid Other | Source: Ambulatory Visit | Attending: Cardiothoracic Surgery | Admitting: Cardiothoracic Surgery

## 2020-03-03 ENCOUNTER — Encounter: Payer: Self-pay | Admitting: Cardiothoracic Surgery

## 2020-03-03 VITALS — BP 154/88 | HR 72 | Temp 97.7°F | Resp 20 | Ht 65.0 in | Wt 213.0 lb

## 2020-03-03 DIAGNOSIS — J9819 Other pulmonary collapse: Secondary | ICD-10-CM

## 2020-03-03 DIAGNOSIS — Z09 Encounter for follow-up examination after completed treatment for conditions other than malignant neoplasm: Secondary | ICD-10-CM

## 2020-03-03 DIAGNOSIS — J869 Pyothorax without fistula: Secondary | ICD-10-CM

## 2020-03-03 HISTORY — DX: Other pulmonary collapse: J98.19

## 2020-03-03 MED ORDER — GABAPENTIN 300 MG PO CAPS: 300.0000 mg | ORAL_CAPSULE | Freq: Every day | ORAL | 0 refills | Status: DC

## 2020-03-03 MED ORDER — GABAPENTIN 300 MG PO CAPS: 300.0000 mg | ORAL_CAPSULE | Freq: Every day | ORAL | 1 refills | Status: DC

## 2020-03-03 NOTE — Progress Notes (Signed)
PCP is Langston Reusing, NP Referring Provider is Kipp Brood, MD  Chief Complaint  Patient presents with  . Routine Post Op    6 week f/u with CXR    HPI: The patient presents for 37-month follow-up with chest x-ray.  He initially had COVID-19 right pneumonia with bacterial superinfection and empyema.  He had a right VATS in February after being hospitalized with Covid for over 6 weeks.  He had a long recovery with prolonged chest tube drainage.  The chest tube is now out.  The patient has a entrapped right lower lobe with a space which has been filled in with a small lateral right pleural effusion which will be permanent. He has minimal shortness of breath and uses a rolling walker.  He is on room air.  He has some nighttime post VATS neuritic pain interfering with his sleep and he will be given a prescription for gabapentin to take at night.   Past Medical History:  Diagnosis Date  . Diabetes mellitus without complication ([place])   . Heel pain 06/09/2015  . High triglycerides 05/09/2016  . Hypertension   . Low HDL (under 40) 05/09/2016  . Obesity, Class II, BMI 35-39.9, with comorbidity ([place]) 01/04/2016    Past Surgical History:  Procedure Laterality Date  . VIDEO ASSISTED THORACOSCOPY (VATS)/EMPYEMA Right 12/18/2019   Procedure: VIDEO ASSISTED THORACOSCOPY (VATS)/EMPYEMA;  Surgeon: Ivin Poot, MD;  Location: [facility] OR;  Service: Thoracic;  Laterality: Right;    Family History  Problem Relation Age of Onset  . Diabetes Father   . COPD Mother     Social History Social History   Tobacco Use  . Smoking status: Former Research scientist (life sciences)  . Smokeless tobacco: Never Used  Substance Use Topics  . Alcohol use: No    Alcohol/week: 0.0 standard drinks  . Drug use: No    Current Outpatient Medications  Medication Sig Dispense Refill  . acetaminophen (TYLENOL) 325 MG tablet Take 2 tablets (650 mg total) by mouth every 6 (six) hours as needed for mild pain or headache (fever >/= 101).     Marland Kitchen albuterol (VENTOLIN HFA) 108 (90 Base) MCG/ACT inhaler Inhale 2 puffs into the lungs every 6 (six) hours as needed for wheezing or shortness of breath. 18 g 2  . aspirin EC 81 MG tablet Take 81 mg by mouth daily.    Marland Kitchen atorvastatin (LIPITOR) 10 MG tablet Take 1 tablet (10 mg total) by mouth every evening. 90 tablet 0  . lisinopril (ZESTRIL) 20 MG tablet Take 1 tablet (20 mg total) by mouth daily. 90 tablet 1  . metFORMIN (GLUCOPHAGE) 500 MG tablet Take 1 tablet (500 mg total) by mouth daily with breakfast. 90 tablet 0  . Omega-3 Fatty Acids (FISH OIL) 1000 MG CAPS Take 1,000 mg by mouth daily.     No current facility-administered medications for this visit.    No Known Allergies  Review of Systems  No fever No productive cough Weight is increasing No anginal pain  BP (!) 154/88   Pulse 72   Temp 97.7 F (36.5 C) (Skin)   Resp 20   Ht 5\' 5"  (1.651 m)   Wt 213 lb (96.6 kg)   SpO2 93% Comment: RA  BMI 35.45 kg/m  Physical Exam      Exam    General- alert and comfortable.  Right VATS incision well-healed.    Neck- no JVD, no cervical adenopathy palpable, no carotid bruit   Lungs- clear without rales, wheezes  Cor- regular rate and rhythm, no murmur , gallop   Abdomen- soft, non-tender   Extremities - warm, non-tender, minimal edema   Neuro- oriented, appropriate, no focal weakness   Diagnostic Tests: Chest x-ray today is personally reviewed with results as noted above  Impression: Patient is recovered from right VATS for drainage of empyema decortication of right lower lobe and history of COVID-19 pneumonia  Plan: He will return as needed.  [name], MD [facility] [phone]

## 2020-05-05 ENCOUNTER — Other Ambulatory Visit: Payer: Medicaid Other

## 2020-05-05 LAB — EXTERNAL GENERIC LAB PROCEDURE: COLOGUARD: NEGATIVE

## 2020-05-05 LAB — COLOGUARD: COLOGUARD: NEGATIVE

## 2020-05-11 ENCOUNTER — Ambulatory Visit: Payer: Medicaid Other | Admitting: Gerontology

## 2020-05-27 IMAGING — CT CT CHEST W/O CM
1 of 2 series · 14 of 32 positions shown, 18 images · non-contrast
Comparison: CT chest dated November 26, 2019.

CLINICAL DATA: Hydropneumothorax. Concern for bronchopleural
fistula.

EXAM:
CT CHEST WITHOUT CONTRAST
TECHNIQUE: Multidetector CT imaging of the chest was performed following the
standard protocol without IV contrast.

[Series 2: routine chest w/o · axial · non-contrast · 0.90mm/px · z∈[+1049,+1324]mm · 14 of 65 slices shown, 18 images]
[im 5/65  mediastinal]
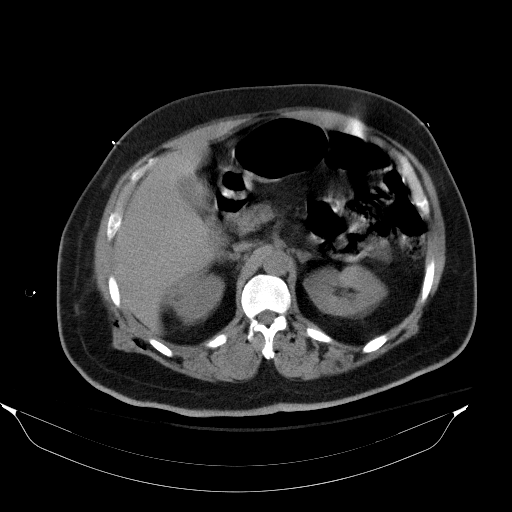
[im 5/65  lung]
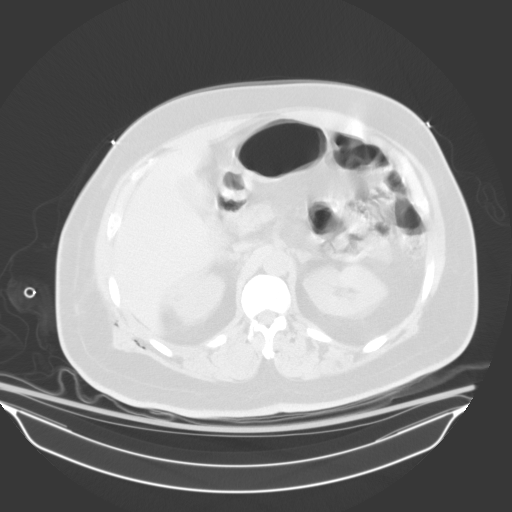
[im 10/65  lung]
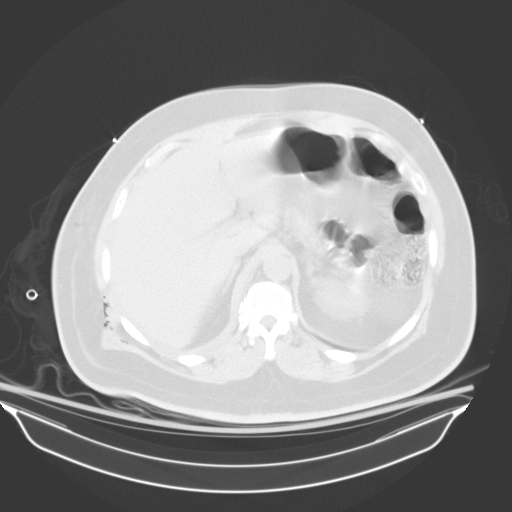
[im 15/65  lung]
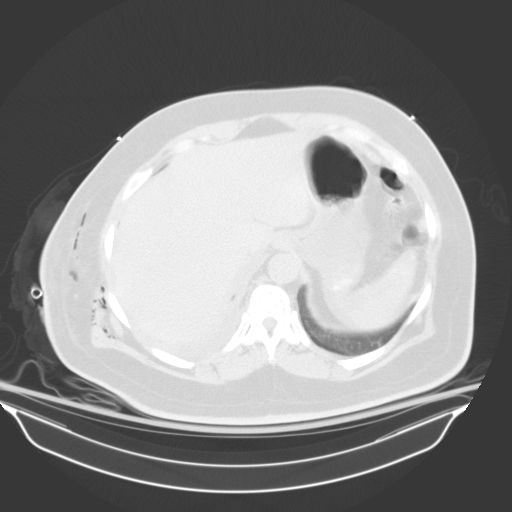
[im 20/65  lung]
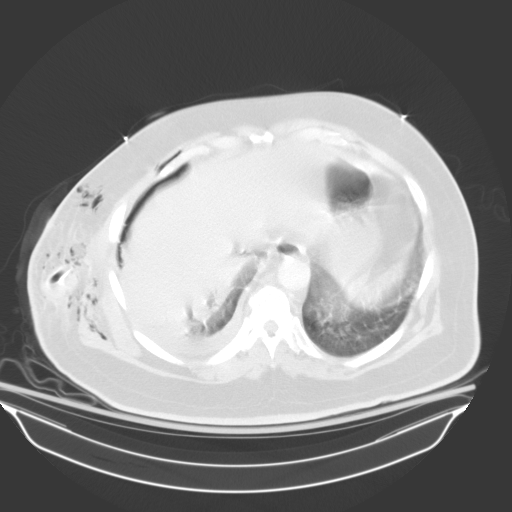
[im 25/65  mediastinal]
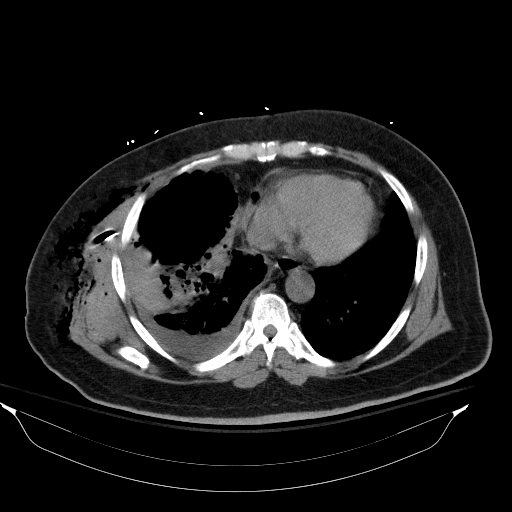
[im 25/65  lung]
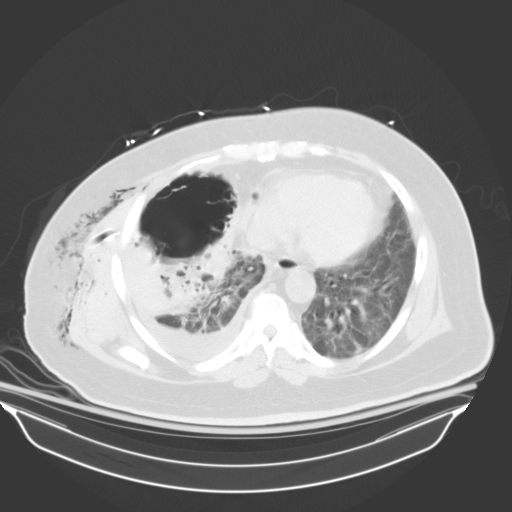
[im 30/65  lung]
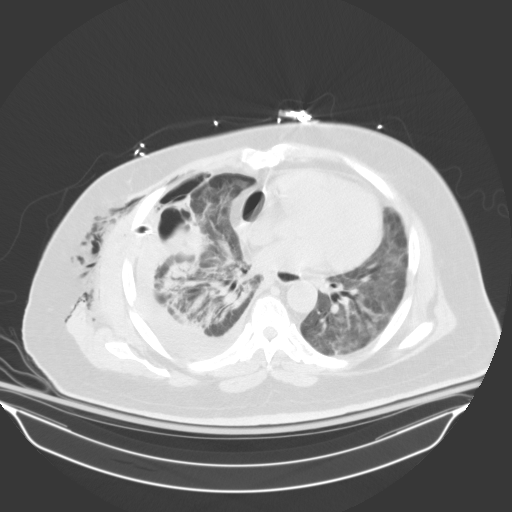
[im 31/65  lung]
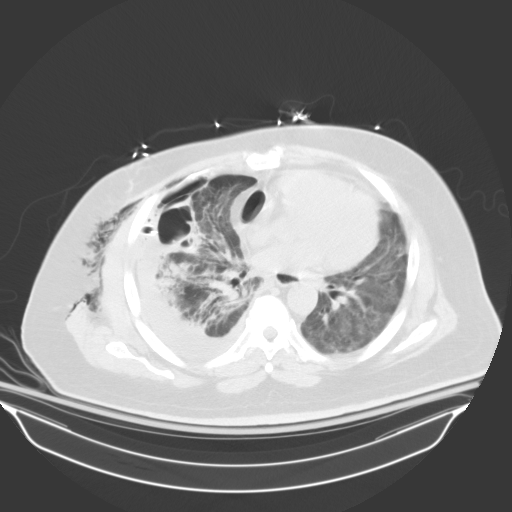
[im 33/65  lung]
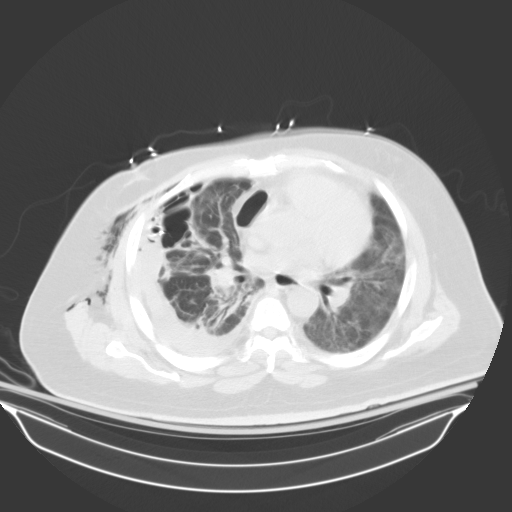
[im 35/65  mediastinal]
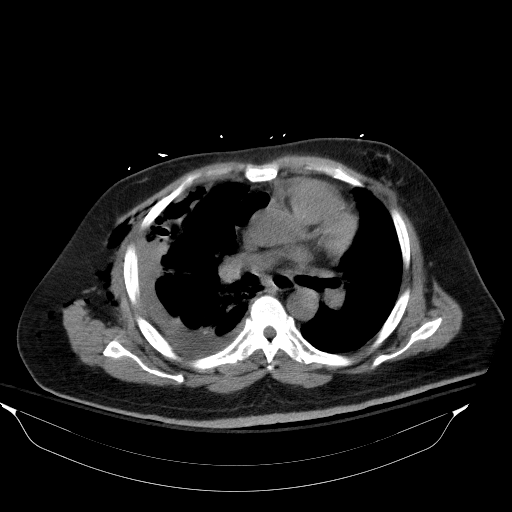
[im 35/65  lung]
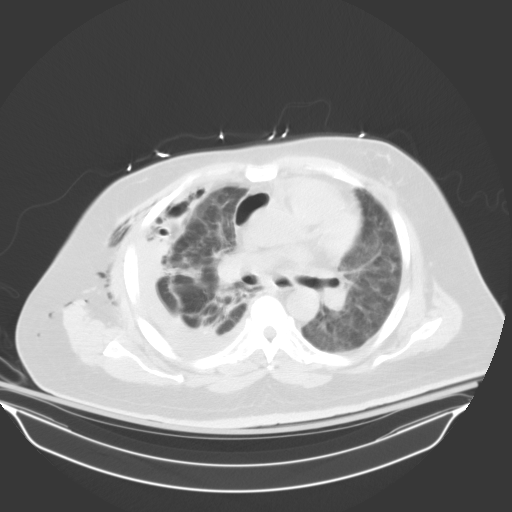
[im 40/65  lung]
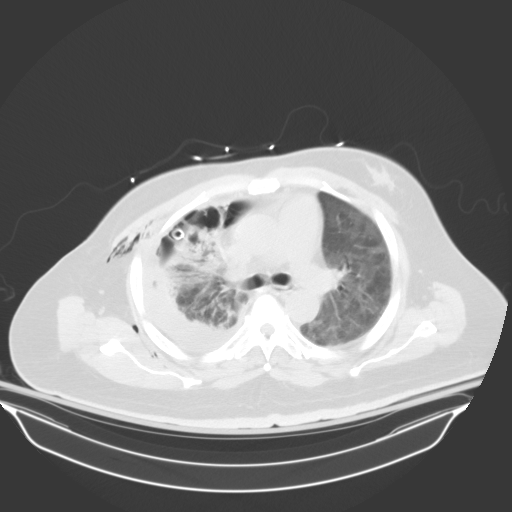
[im 45/65  lung]
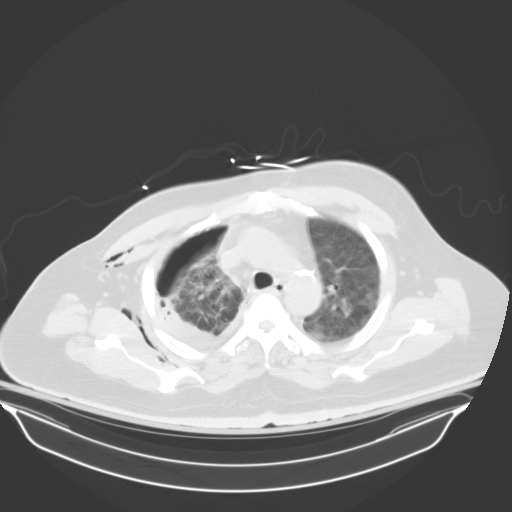
[im 50/65  lung]
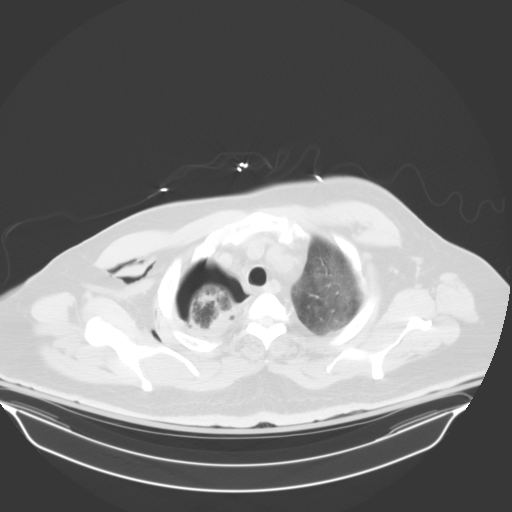
[im 55/65  mediastinal]
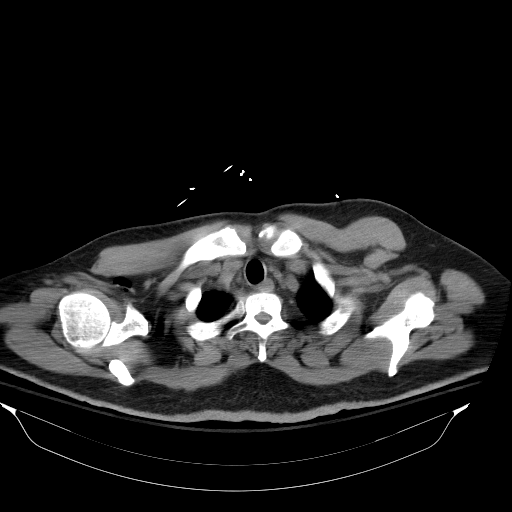
[im 55/65  lung]
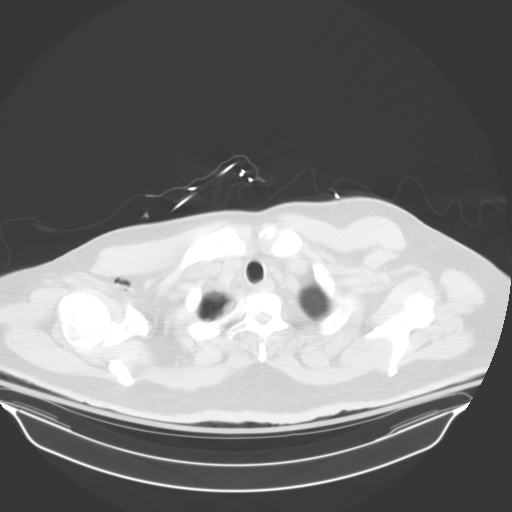
[im 60/65  lung]
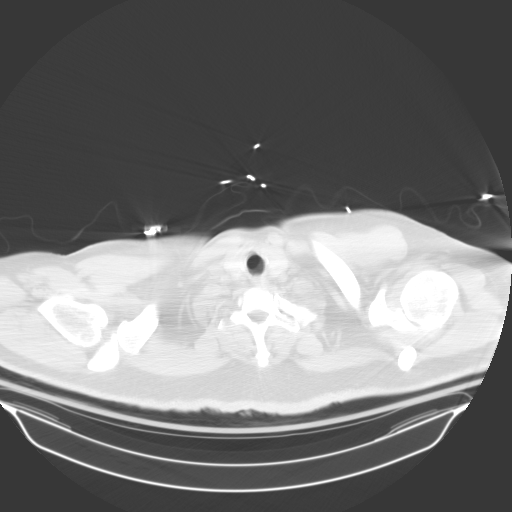

[14 of 32 positions shown; findings below may reference images not displayed]

FINDINGS: Cardiovascular: Normal heart size. No pericardial effusion. No
thoracic aortic aneurysm. Coronary, aortic arch, and branch vessel
atherosclerotic vascular disease.

Mediastinum/Nodes: Improved mild leftward mediastinal shift. No
enlarged mediastinal or axillary lymph nodes. Thyroid gland,
trachea, and esophagus demonstrate no significant findings. Focus of
mediastinal gas in the anterior right pericardial region has mildly
decreased.

Lungs/Pleura: Right chest tube in place. Right hydropneumothorax
again noted with interval decrease in size of the gas component.
Thin internal septations and air-fluid levels again noted at the
right lung base. Suspected bronchopleural fistula arising from the
right upper and middle lobes (series 4, images 27 and 34). Scattered
mild to moderate atelectasis in the right lung, improved from prior
CT. Extensive patchy ground-glass density and reticulation
throughout the left and right lungs, improved. No left pleural
effusion or pneumothorax.

Upper Abdomen: No acute abnormality.

Musculoskeletal: Subcutaneous emphysema in the right axilla and
lateral chest wall. No acute or significant osseous finding.
Unchanged left greater than right gynecomastia.
IMPRESSION: 1. Decreased small to moderate right hydropneumothorax. Suspected
bronchopleural fistula arising from the right upper and middle
lobes.
2. Improved aeration of the right lung with residual mild to
moderate atelectasis.
3. Decreased pneumomediastinum in the anterior right pericardial
region.
4. Extensive patchy ground-glass density and reticulation throughout
the left and right lungs, improved from prior CT, remains consistent
with [id number] pneumonia.
5.  Aortic atherosclerosis ([id number].C).

## 2020-05-28 IMAGING — DX DG CHEST 1V PORT
1 series · 1 of 1 positions shown · non-contrast
Comparison: Portable exam 3073 hours compared to 12/03/2019

CLINICAL DATA: Pleural effusion, diabetes mellitus, hypertension,
LG7AG-27, chest tube

EXAM:
PORTABLE CHEST 1 VIEW

[chest ap]
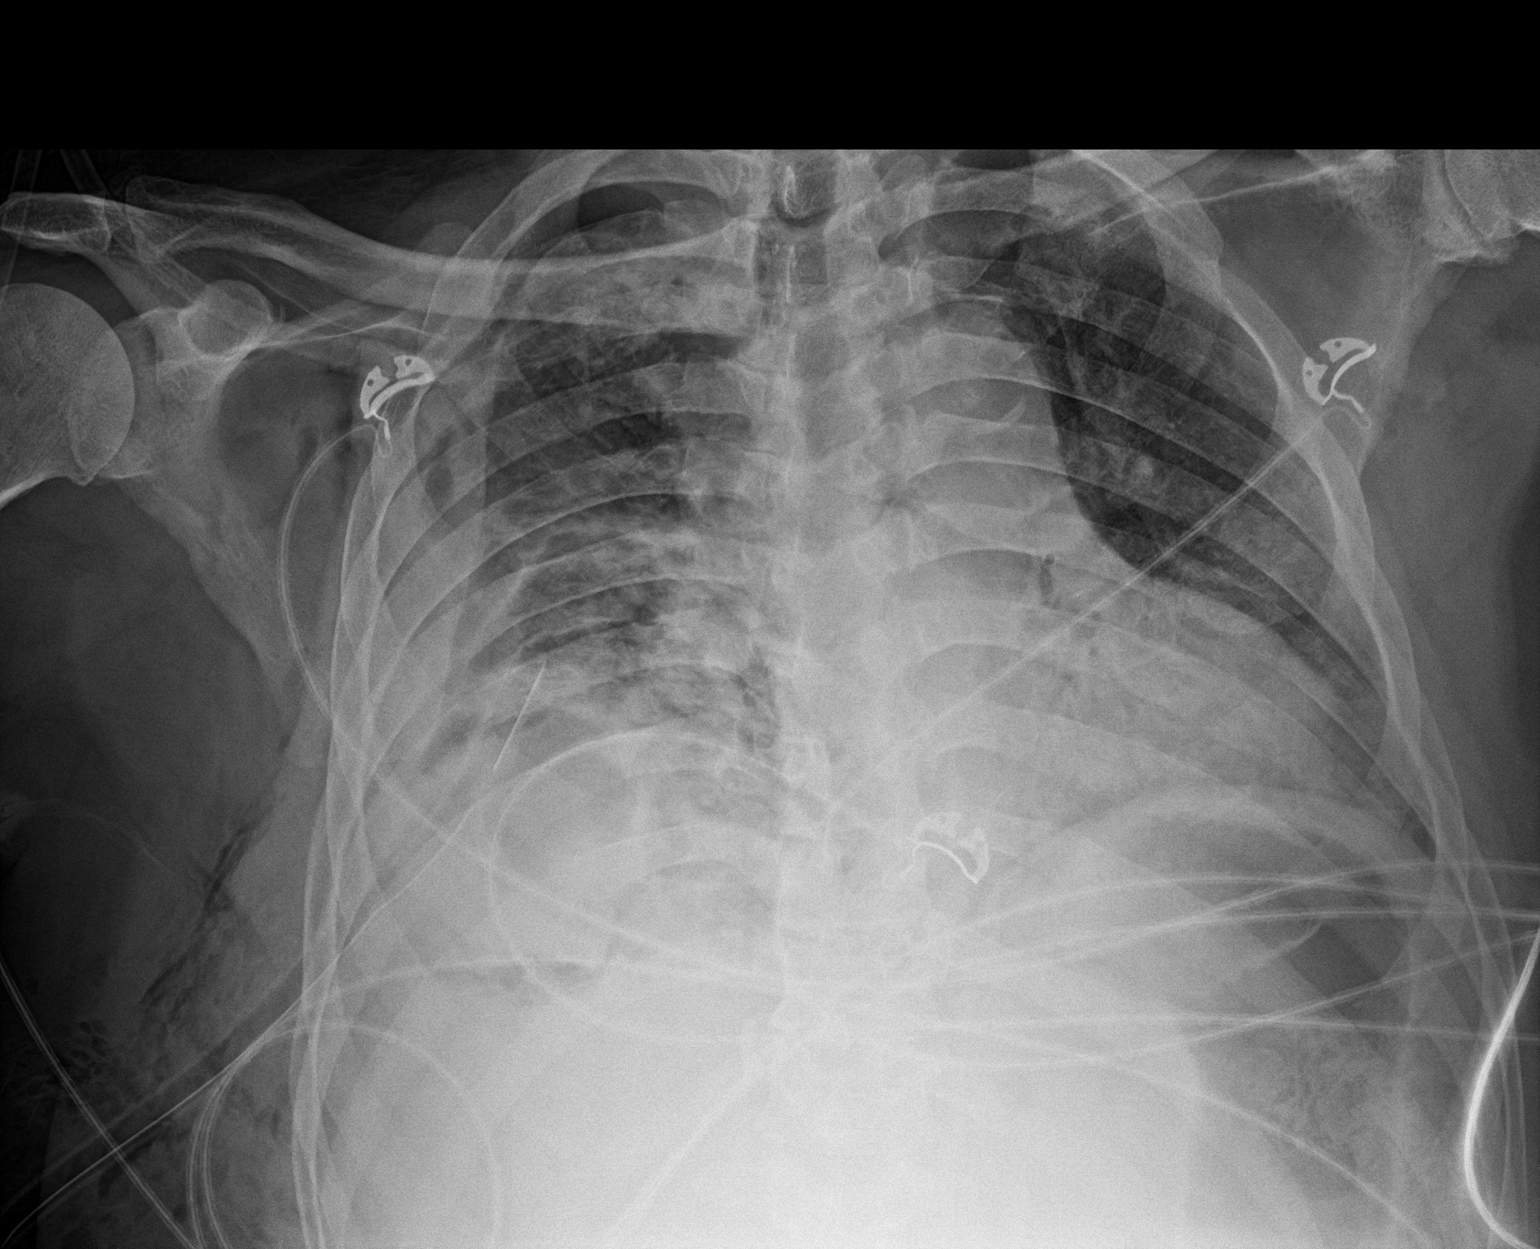

[1 of 1 positions shown; findings below may reference images not displayed]

FINDINGS: RIGHT thoracostomy tube unchanged.

Upper normal size of cardiac silhouette.

Mediastinal contours normal with atherosclerotic calcification of
aorta noted.

BILATERAL pulmonary infiltrates consistent with multifocal pneumonia
greater on RIGHT.

Small RIGHT pleural effusion and RIGHT apex pneumothorax, unchanged.

No LEFT pneumothorax or pleural effusion.
IMPRESSION: Persistent RIGHT hydropneumothorax despite thoracostomy tube.

Asymmetric pulmonary infiltrates RIGHT greater than LEFT consistent
with multifocal pneumonia and history of LG7AG-27.

## 2020-05-29 IMAGING — DX DG CHEST 1V PORT
1 series · 1 of 1 positions shown · non-contrast
Comparison: Chest x-ray 12/04/2019.  Chest CT 12/03/2019.

CLINICAL DATA: Bronchopleural fistula.  Hypoxia.  COVID positive.

EXAM:
PORTABLE CHEST 1 VIEW

[chest ap]
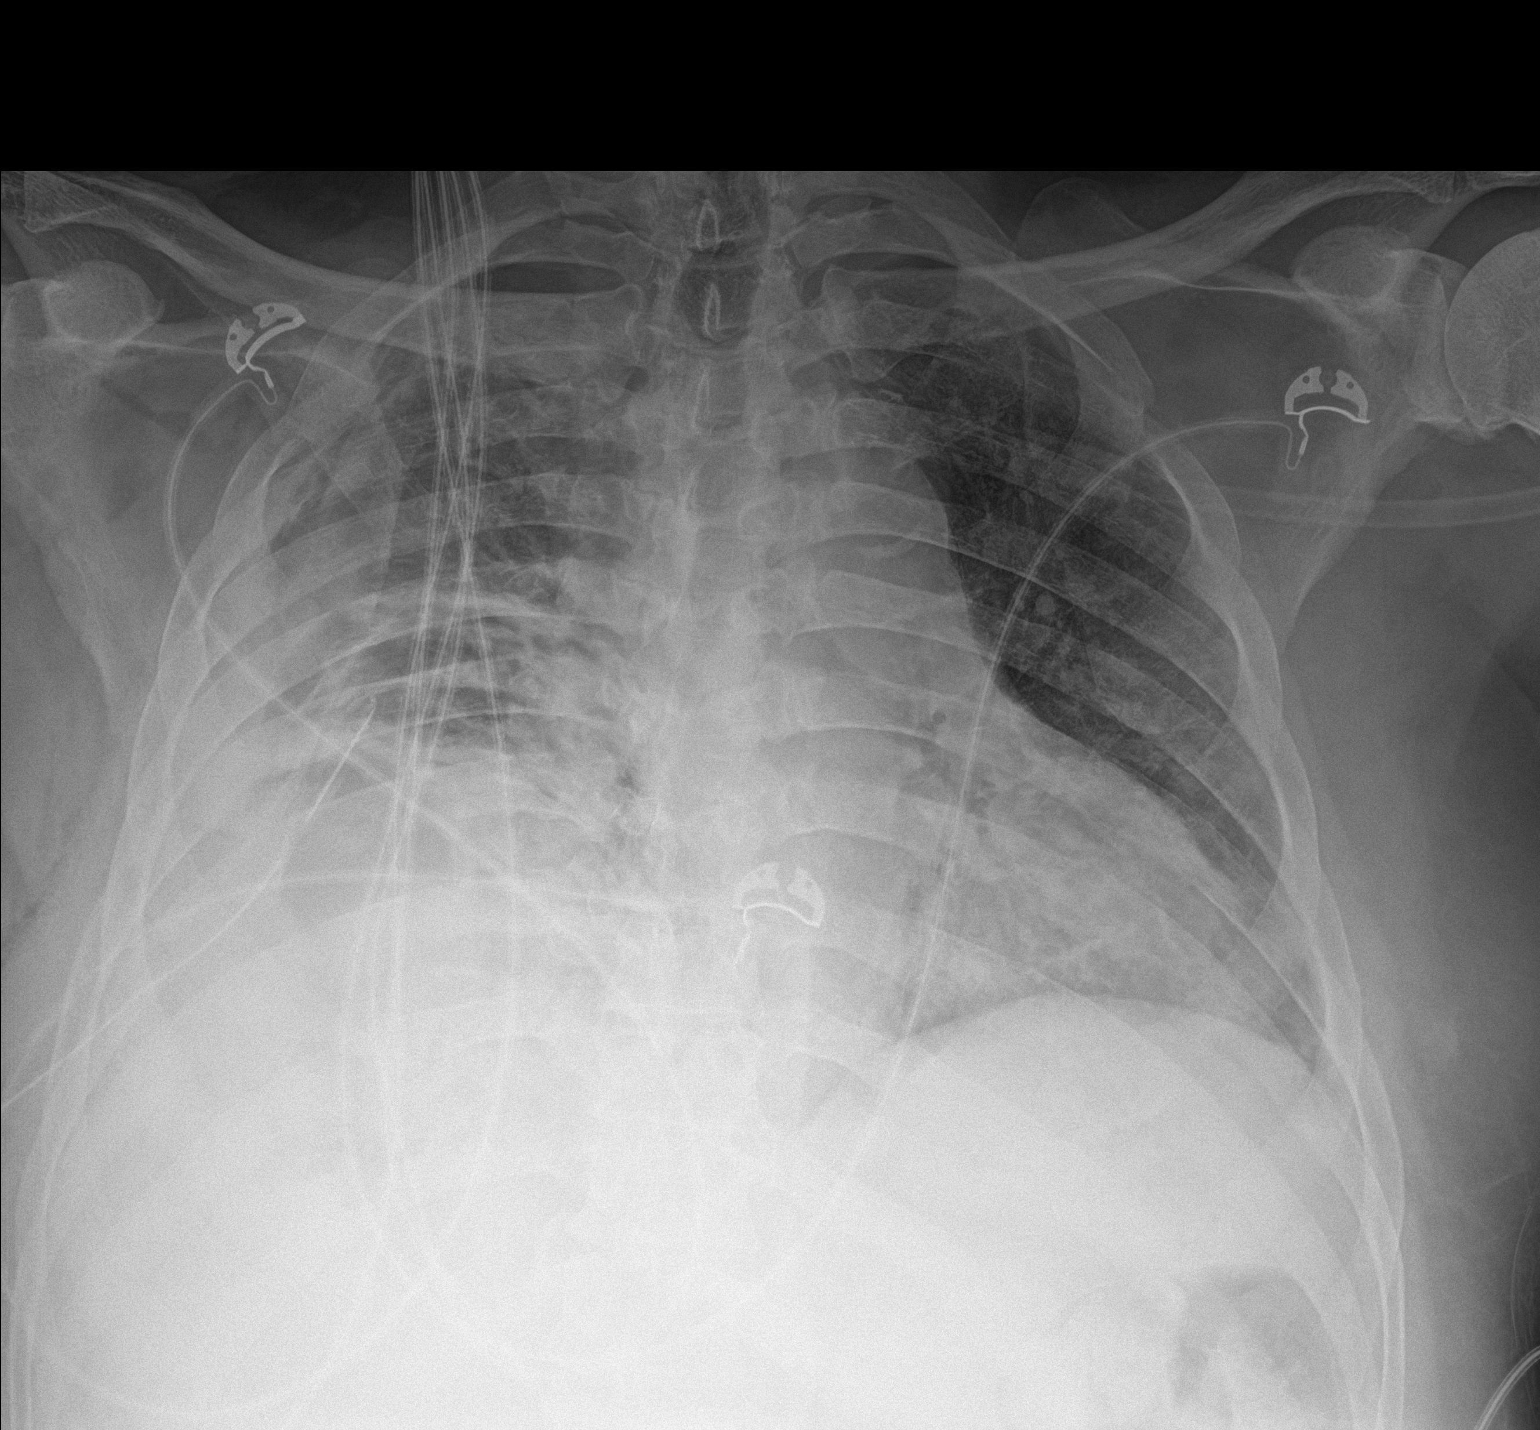

[1 of 1 positions shown; findings below may reference images not displayed]

FINDINGS: Right chest tube in stable position. Unchanged right
hydropneumothorax. Unchanged bilateral pulmonary infiltrates. Heart
size stable. Mild right chest wall subcutaneous emphysema again
noted. No acute bony abnormality.
IMPRESSION: 1. Right chest tube in stable position. Unchanged right
hydropneumothorax. Right chest wall subcutaneous emphysema again
noted.

2. Unchanged multifocal bilateral pulmonary infiltrates again noted
in this known COVID positive patient.

## 2020-05-31 IMAGING — DX DG CHEST 1V PORT
1 series · 1 of 1 positions shown · non-contrast
Comparison: Portable exam 4744 hours compared to 12/06/2019

CLINICAL DATA: Pneumonia due to [id number] , loculated
hydropneumothorax, chest tube

EXAM:
PORTABLE CHEST 1 VIEW

[chest ap]
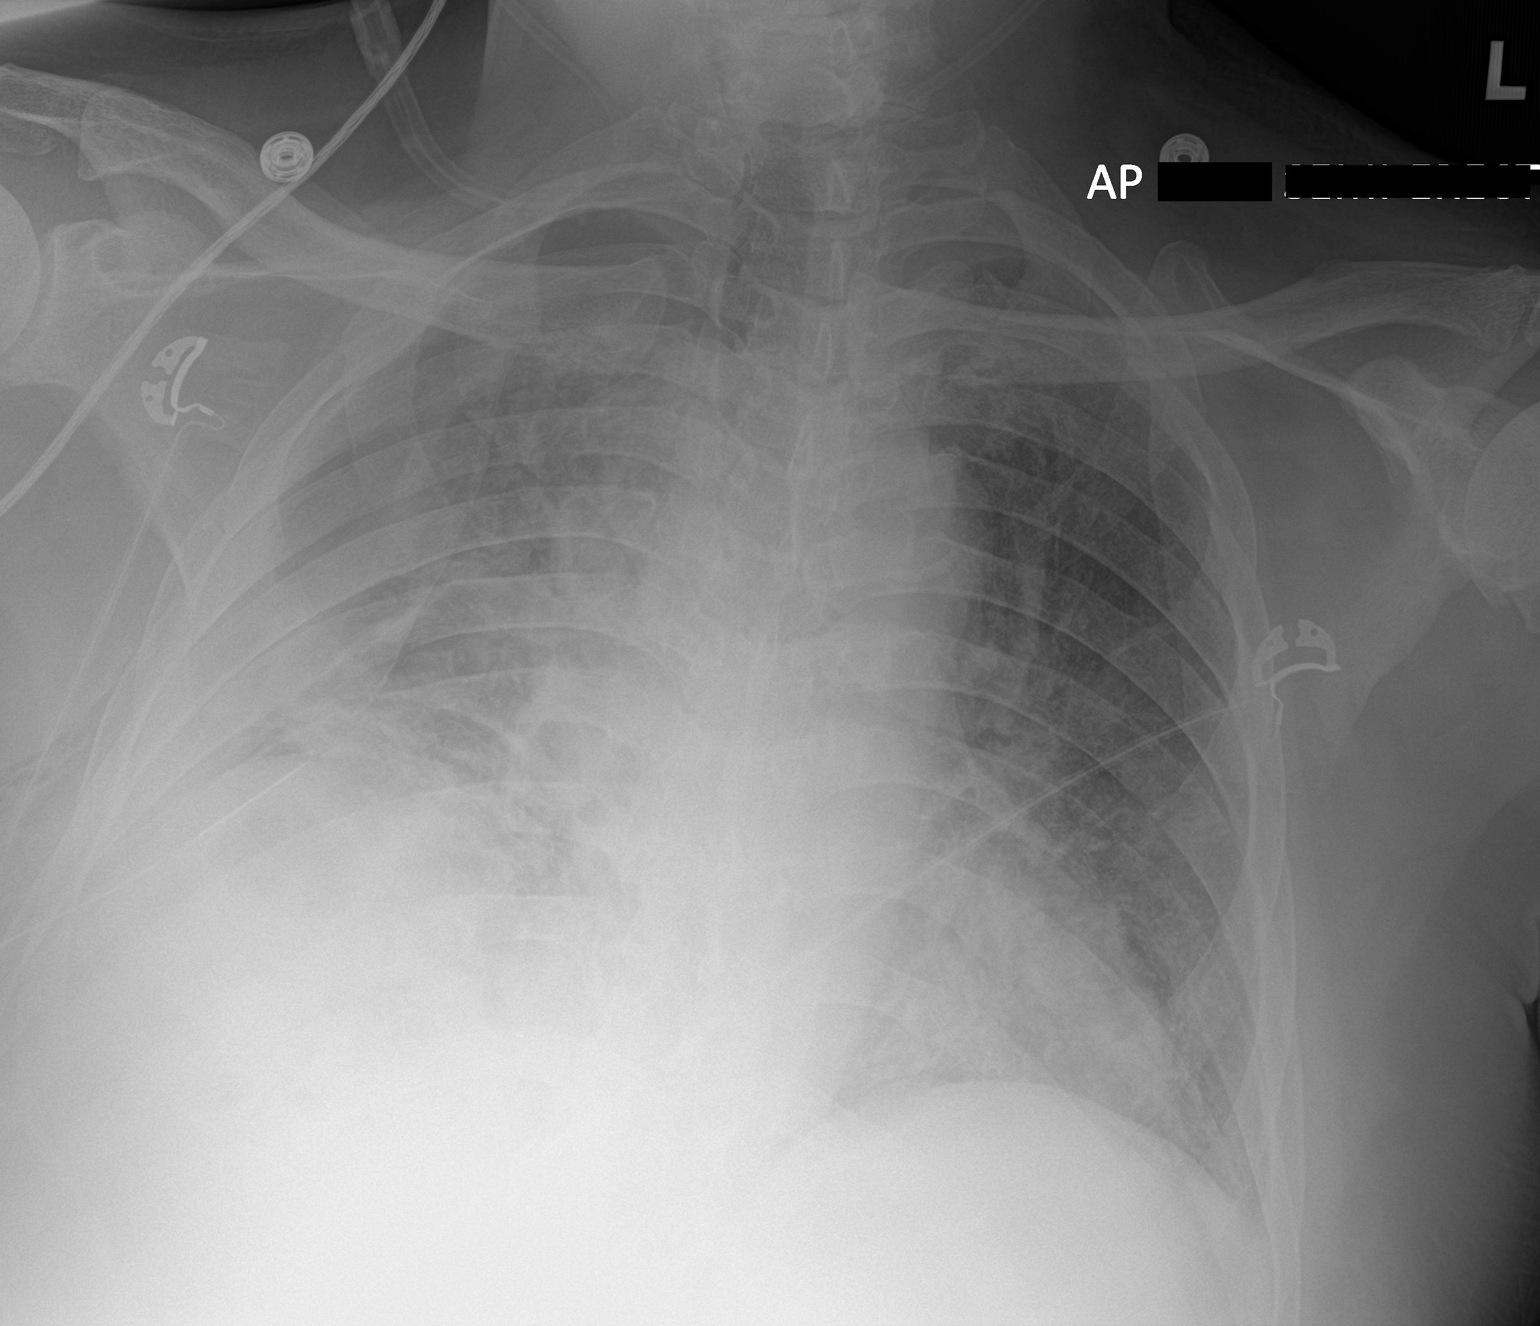

[1 of 1 positions shown; findings below may reference images not displayed]

FINDINGS: RIGHT thoracostomy tube unchanged.

Normal size cardiac silhouette.

BILATERAL pulmonary infiltrates consistent with multifocal
pneumonia.

Persistent RIGHT hydropneumothorax.

No acute osseous findings.
IMPRESSION: Persistent RIGHT hydropneumothorax despite thoracostomy tube.

Diffuse BILATERAL pulmonary infiltrates consistent with multifocal
pneumonia and YEH4N-KU.

## 2021-04-14 ENCOUNTER — Ambulatory Visit: Payer: Medicaid Other | Attending: Family | Admitting: Physical Therapy

## 2021-04-14 ENCOUNTER — Other Ambulatory Visit: Payer: Self-pay

## 2021-04-14 ENCOUNTER — Encounter: Payer: Self-pay | Admitting: Physical Therapy

## 2021-04-14 DIAGNOSIS — M545 Low back pain, unspecified: Secondary | ICD-10-CM | POA: Diagnosis present

## 2021-04-14 DIAGNOSIS — M6281 Muscle weakness (generalized): Secondary | ICD-10-CM | POA: Diagnosis present

## 2021-04-14 DIAGNOSIS — R262 Difficulty in walking, not elsewhere classified: Secondary | ICD-10-CM | POA: Diagnosis present

## 2021-04-14 NOTE — Therapy (Signed)
[facility] [address] [place], [place], [address] Phone: [phone]   Fax:  [phone]  Physical Therapy Evaluation  Patient Details  Name: Nathaniel Webb MRN: [id number] Date of Birth: 07/29/1966 Referring Provider (PT): Miki KinsAmanda M Shirley, FNP   Encounter Date: 04/14/2021   PT End of Session - 04/14/21 1352    Visit Number 1    Number of Visits 13    Date for PT Re-Evaluation 06/09/21    Authorization Type UHC Medicaid VL 27 combined PT/OT/SLP per calendar year    Authorization Time Period Eval 04/14/2021    Authorization - Visit Number --    Authorization - Number of Visits --    Progress Note Due on Visit 10    PT Start Time 1116    PT Stop Time 1215    PT Time Calculation (min) 59 min    Activity Tolerance Patient tolerated treatment well;No increased pain;Patient limited by fatigue   Patient has SOB secondary to hold lung Sx and subsequent COVID-19 infection   Behavior During [facility] for tasks assessed/performed           Past Medical History:  Diagnosis Date  . Diabetes mellitus without complication (HCC)   . Heel pain 06/09/2015  . High triglycerides 05/09/2016  . Hypertension   . Low HDL (under 40) 05/09/2016  . Obesity, Class II, BMI 35-39.9, with comorbidity ([facility]) 01/04/2016    Past Surgical History:  Procedure Laterality Date  . VIDEO ASSISTED THORACOSCOPY (VATS)/EMPYEMA Right 12/18/2019   Procedure: VIDEO ASSISTED THORACOSCOPY (VATS)/EMPYEMA;  Surgeon: Kerin PernaVan Trigt, Peter, MD;  [facility];  Service: Thoracic;  Laterality: Right;    There were no vitals filed for this visit.    Subjective Assessment - 04/14/21 1113    Subjective 55 year-old male with primary complaint of low back pain    Pertinent History Patient is a 55 year old male with primary complaint of low back pain with Hx of R lung surgery 2 years ago (unspecified) with some shortness of breath following this. SOB was also exacerbated with  previous COVID-19 infection. Pt reports onset of low back pain about 2 months ago. Pt reports that when he starts walking, he has pain in lumbosacral region. Pt reports Hx of peripheral neuropathy for about 5 years. Pt denies new numbness or paresthesias. Patient reports difficulty getting to sleep; pt denies pain waking up from his sleep. Pt reports no change in bowel/bladder with onset of back pain. No recent imaging on low back. Patient reports no pain at rest. He reports 10/10 pain with standing and walking. Time of day does not affect pain. Pt does use rolling walker for prolonged walking; he will use grocery cart at grocery. 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    Limitations Standing;Walking    How long can you stand comfortably? 5 minutes    How long can you walk comfortably? 10 minutes    Patient Stated Goals Able to complete more physical activity    Currently in Pain? 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  Recent memory is intact.  Remote memory is intact.  Attention span and concentration are intact.  Expressive speech is intact.  Patient's fund of knowledge is within normal limits for educational level.  SENSATION: Grossly intact to light touch bilateral LEs as determined by testing  dermatomes L2-S2 Proprioception and hot/cold testing deferred on this date   MUSCULOSKELETAL: Tremor: None Bulk: Normal Tone: Normal No visible step-off along spinal column  Posture Lumbar lordosis: decreased Thoracic kyphosis: increased Lumbar lateral shift: negative  Gait Mild ispilateral trunk lean to R with ipsilateral stance phase   Palpation Tenderness to palpation along R>L iliocostalis and longissimus lumborum L1-L5, R PSIS Mild hypersensitivity of R thoracic incision   Strength (out of 5) R/L 4/4 Hip flexion 4+/4+ Hip ER 5/5 Hip IR 5/5 Hip adduction 5/5 Knee extension 5/5 Knee flexion 5/5 Ankle dorsiflexion 5/5 Ankle plantarflexion  *Indicates pain   AROM (degrees) R/L (all movements include overpressure unless otherwise stated) Lumbar forward flexion (65): 100% reach to toes,  *pain with return to neutral from flexion  Lumbar extension (30): 100% no pain  Lumbar lateral flexion (25): R: 100%  L: 75% * (R flank pain) Thoracic and Lumbar rotation (30 degrees):  R: 100% L: 75% * (pain along R thoracic scar)  Hip IR (0-45): R:30 deg L: 30 deg Hip ER (0-45): R: 40 deg L: 30 deg Hip Flexion (0-125): R: 100 deg, L 90 deg Hip Abduction (0-40): R: WNL L: WNL *Indicates pain   Repeated Movements No centralization or peripheralization of symptoms with repeated lumbar extension or flexion.    Muscle Length Hamstrings: R: 30 degrees L: 40 degrees    Passive Accessory Intervertebral Motion (PAIVM) Reproduction of back pain with CPA L1-L5. Generally hypomobile throughout.    SPECIAL TESTS Radiculopathy Slump: R: Negative L: Negative SLR: R: Negative L:  Negative Crossed SLR: R: Negative L: Negative  SIJ Thigh trust: Negative Sacral thrust: Negative Sacral compression: Negative SI anterior and posterior primary stress: Negative   Objective measurements completed on examination: See above findings.      Treatment Performed (unbilled  time)  Therapeutic exercise - for thoracolumbar mobility, paraspinal and posterior LE chain soft tissue mobility, ROM Single knee to chest; x10 each LE, 1 sec hold Butterfly stretch; x30 sec (for demo purposes) Seated hamstring stretch; x30sec  Moderate verbal and tactile cueing and demonstration provided for each exercise performed to improve cadence, holding time, and exercise technique.  Manual therapy - for soft tissue mobility and extensibility, tissue desensitization and pain modulation STM/DTM R>L erector spinae L1-L5     PT Education - 04/14/21 1350    Education Details Patient education on current condition, role of PT, prognosis, plan of care. Patient education on activity modification and using "pain as guide" with only reasonable levels of pain with walking. Discussed benefit of regular light aerobic work. Patient educated on edema control for ankles/feet and use of compressive garments prn. Discussed role of scar massage to address scar sensitivity and mobility.    Person(s) Educated Patient    Methods Explanation;Tactile cues;Handout;Verbal cues    Comprehension Verbalized understanding;Returned demonstration;Verbal cues required;Tactile  cues required            PT Short Term Goals - 04/14/21 1413      PT SHORT TERM GOAL #1   Title Patient will be indepedent and 100% compliant with established HEP and activity modification as needed to augment PT intervention and prevent flare-up of patient condition.    Baseline No established HEP, home program provided at IE    Time 2    Period Weeks    Status New    Target Date 04/28/21      PT SHORT TERM GOAL #2   Title Patient will have full thoracolumbar AROM without reproduction of pain as needed for functional reaching, overhead activity, self-care ADLs    Baseline Difficulty with return to neutral with lumbar flexion. Pain with L-sided sidebend and rotation    Time 4    Period Weeks    Status New    Target Date 05/12/21       PT SHORT TERM GOAL #3   Title Patient will tolerate standing up to 30 minutes without reproduction of pain as needed for completing household chores, community-level mobility, and social/community outings    Baseline Difficulty standing > 5 mins    Time 4    Period Weeks    Status New    Target Date 05/12/21             PT Long Term Goals - 04/14/21 1418      PT LONG TERM GOAL #1   Title Patient will demonstrate improved function as evidenced by a score of 56 on FOTO measure for full participation in activities at home and in the community.    Baseline FOTO 39    Time 8    Period Weeks    Status New    Target Date 06/09/21      PT LONG TERM GOAL #2   Title Patient will tolerate walking up to 1 hour without reproduction of pain in back as needed for community outings, grocery shopping, household and outdoor chores    Baseline Difficulty walking > 10 minutes    Time 8    Period Weeks    Status New    Target Date 06/09/21      PT LONG TERM GOAL #3   Title Patient will perform up to 30 minutes of standing/walking activity in clinic without reproduction of back pain necessitating sitting break indicative of improved standing tolerance and ability to complete community-level mobility tasks, social outings, grocery shopping    Baseline Difficulty standing > 5 mins, walking > 10 mins    Time 8    Period Weeks    Status New    Target Date 06/09/21                  Plan - 04/14/21 1358    Clinical Impression Statement Clinical Impression: Pt is a pleasant 55 year old male referred for low back pain. PT examination reveals deficits in thoracolumbar AROM and standing/walking duration. (+) shopping cart sign and pain with extension postures/positions. Pt is appropriate for flexion bias program and addressing specific mobility impairments noted below. Pt presents with deficits in LE posterior chain soft tissue mobility, strength, L-spine and L>R hip range of motion, and  local nociceptive pain. Pt will benefit from skilled PT services to address deficits and return to pain-free function at home and work.    Personal Factors and Comorbidities Comorbidity 3+;Fitness   Hx of thoracoscopy for empyema, R side  Comorbidities obesity, Type 2 DM, Hx of COVID-19 infection and persistent SOB, HTN, Hx of thoracoscopy for R empyema    Examination-Activity Limitations Stand;Locomotion Level;Sleep    Examination-Participation Restrictions Cleaning;Church;Community Activity;Shop    Stability/Clinical Decision Making Evolving/Moderate complexity    Clinical Decision Making Moderate    Rehab Potential Good    PT Frequency 2x / week    PT Duration 8 weeks    PT Treatment/Interventions Electrical Stimulation;Therapeutic activities;Therapeutic exercise;Neuromuscular re-education;Manual techniques;Dry needling;Patient/family education    PT Next Visit Plan Manual therapy including STM/DTM for densitization of affected paraspinals, flexion bias movement program, patient education on activity modification prn and progression of thoracolumbar ROM in clinic. Consider DN prn to address soft tissue TrPs along R>L lumbar erector spinae    PT Home Exercise Plan Access Code [email or web address]    Consulted and Agree with Plan of Care Patient           Patient will benefit from skilled therapeutic intervention in order to improve the following deficits and impairments:  Decreased endurance,Decreased mobility,Difficulty walking,Hypomobility,Pain,Impaired flexibility,Decreased strength,Decreased activity tolerance,Decreased range of motion  Visit Diagnosis: Acute right-sided low back pain without sciatica  Difficulty in walking, not elsewhere classified  Muscle weakness (generalized)     Problem List Patient Active Problem List   Diagnosis Date Noted  . 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 Postop check 01/21/2020  . Cough 01/08/2020  . Shortness of breath 01/08/2020  . Empyema of pleural space  (HCC) 12/18/2019  . Chest tube in place   . ARDS (adult respiratory distress syndrome) (HCC)   . Empyema lung (HCC)   . Hydropneumothorax   . MSSA bacteremia 11/25/2019  . Constipation 11/17/2019  . Diabetes mellitus type 2, uncontrolled, with complications (HCC) 11/12/2019  . Acute pulmonary embolus (HCC) 11/12/2019  . Demand ischemia (HCC) 11/12/2019  . Acute respiratory failure with hypoxemia (HCC) 11/11/2019  . History of COVID-19 pneumonia 11/10/2019  . COVID-19 11/10/2019  . Acute respiratory failure (HCC) 11/09/2019  . Health care maintenance 07/29/2019  . Metatarsalgia of left foot 06/18/2019  . Prediabetes 05/08/2019  . Peripheral neuropathy 05/08/2019  . Osteoarthritis of both knees 05/08/2019  . Osteoarthritis of right knee 02/15/2017  . Hand cramps 11/09/2016  . Tinea pedis 06/04/2016  . Low HDL (under 40) 05/09/2016  . High triglycerides 05/09/2016  . Medication monitoring encounter 05/03/2016  . Controlled type 2 diabetes mellitus without complication, without long-term current use of insulin (HCC) 01/04/2016  . Needs flu shot 01/04/2016  . Morbid obesity (HCC) 01/04/2016  . Screening for STD (sexually transmitted disease) 01/04/2016  . Encounter for cholesteral screening for cardiovascular disease 01/04/2016  . AD (atopic dermatitis) 06/09/2015  . Chronic low back pain 06/09/2015  . Hypertension 06/09/2015  . Type 2 diabetes, controlled, with peripheral neuropathy (HCC) 12/23/2014   Consuela Mimes, PT, DPT [id number] Gertie Exon 04/14/2021, 2:49 PM  [place] [facility] [address] [place], [place], [address] Phone: [phone]   Fax:  [phone]  Name: Nathaniel Webb MRN: [id number] Date of Birth: 1966/08/21

## 2021-04-19 ENCOUNTER — Other Ambulatory Visit: Payer: Self-pay

## 2021-04-19 ENCOUNTER — Ambulatory Visit: Payer: Medicaid Other | Admitting: Physical Therapy

## 2021-04-19 DIAGNOSIS — M6281 Muscle weakness (generalized): Secondary | ICD-10-CM

## 2021-04-19 DIAGNOSIS — M545 Low back pain, unspecified: Secondary | ICD-10-CM

## 2021-04-19 DIAGNOSIS — R262 Difficulty in walking, not elsewhere classified: Secondary | ICD-10-CM

## 2021-04-19 NOTE — Therapy (Signed)
[place] [facility] [address] [place], [place], [address] Phone: [phone]   Fax:  [phone]  Physical Therapy Treatment  Patient Details  Name: Nathaniel Webb MRN: [id number] Date of Birth: 1966/10/31 Referring Provider (PT): Miki Kins, FNP   Encounter Date: 04/19/2021   PT End of Session - 04/19/21 1239    Visit Number 2    Number of Visits 13    Date for PT Re-Evaluation 06/09/21    Authorization Type UHC Medicaid VL 27 combined PT/OT/SLP per calendar year    Authorization Time Period Eval 04/14/2021    Authorization - Visit Number 1    Authorization - Number of Visits 10    Progress Note Due on Visit 10    PT Start Time 1115    PT Stop Time 1201    PT Time Calculation (min) 46 min    Activity Tolerance Patient tolerated treatment well;No increased pain;Patient limited by fatigue   Patient has SOB secondary to hold lung Sx and subsequent COVID-19 infection   Behavior During Therapy [facility] for tasks assessed/performed           Past Medical History:  Diagnosis Date  . Diabetes mellitus without complication (HCC)   . Heel pain 06/09/2015  . High triglycerides 05/09/2016  . Hypertension   . Low HDL (under 40) 05/09/2016  . Obesity, Class II, BMI 35-39.9, with comorbidity (HCC) 01/04/2016    Past Surgical History:  Procedure Laterality Date  . VIDEO ASSISTED THORACOSCOPY (VATS)/EMPYEMA Right 12/18/2019   Procedure: VIDEO ASSISTED THORACOSCOPY (VATS)/EMPYEMA;  Surgeon: Kerin Perna, MD;  Location: [facility] OR;  Service: Thoracic;  Laterality: Right;    There were no vitals filed for this visit.   Subjective Assessment - 04/19/21 1115    Subjective Patient reports ongoing pain on his R side with standing for prolonged period. Patient reports no pain at rest/in sitting. Patient reports compliance with his HEP. Patient reports some tossing and turning at night and pain with lying on R side.    Pertinent History Patient is a  55 year old male with primary complaint of low back pain with Hx of R lung surgery 2 years ago (unspecified) with some shortness of breath following this. SOB was also exacerbated with previous COVID-19 infection. Pt reports onset of low back pain about 2 months ago. Pt reports that when he starts walking, he has pain in lumbosacral region. Pt reports Hx of peripheral neuropathy for about 5 years. Pt denies new numbness or paresthesias. Patient reports difficulty getting to sleep; pt denies pain waking up from his sleep. Pt reports no change in bowel/bladder with onset of back pain. No recent imaging on low back. Patient reports no pain at rest. He reports 10/10 pain with standing and walking. Time of day does not affect pain. Pt does use rolling walker for prolonged walking; he will use grocery cart at grocery. 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    Limitations Standing;Walking    How long can you stand comfortably? 5 minutes    How long can you walk comfortably? 10 minutes    Patient Stated Goals Able to complete more physical activity    Currently in Pain? No/denies    Pain Onset More than a month ago             Treatment Performed   Therapeutic exercise - for thoracolumbar mobility, paraspinal and posterior LE chain soft tissue mobility, ROM  Lower trunk rotation; x10 ea dir, hooklying Single knee to chest; x10 each LE, 1 sec hold Butterfly stretch; 3x30 sec  Seated hamstring stretch; 1x30sec Seated swiss ball rollout, Blue Swiss ball, 3-way (forward, diagonal L and R); x5, 5 sec each dir  Moderate verbal and tactile cueing and demonstration provided for each exercise performed to improve cadence, holding time, and exercise technique.  Manual therapy - for soft tissue mobility and extensibility, tissue desensitization and pain modulation STM/DTM R>L erector spinae L1-L5 CPA L1-L5 gr I-II, R UPA gr I-II for pain  control   Trigger Point Dry Needling (TDN), unbilled Education performed with patient regarding potential benefit of TDN. Reviewed precautions and risks with patient. Extensive time spent with pt to ensure full understanding of TDN risks. Pt provided verbal consent to treatment. TDN performed to with 0.30 x 60 single needle placements with local twitch response (LTR) along R L3 and L4 iliocostalis lumborum, R gluteus maximus. Pistoning technique utilized. Improved R lower quarter pain and sensation of tightness following treatment.      PT Education - 04/19/21 1237    Education Details Educated patient technique and form with exercises given. Dicussed post-dry needling expectations and increased hydration following TDN.    Person(s) Educated Patient;Spouse    Methods Explanation;Demonstration    Comprehension Verbalized understanding;Returned demonstration            PT Short Term Goals - 04/14/21 1413      PT SHORT TERM GOAL #1   Title Patient will be indepedent and 100% compliant with established HEP and activity modification as needed to augment PT intervention and prevent flare-up of patient condition.    Baseline No established HEP, home program provided at IE    Time 2    Period Weeks    Status New    Target Date 04/28/21      PT SHORT TERM GOAL #2   Title Patient will have full thoracolumbar AROM without reproduction of pain as needed for functional reaching, overhead activity, self-care ADLs    Baseline Difficulty with return to neutral with lumbar flexion. Pain with L-sided sidebend and rotation    Time 4    Period Weeks    Status New    Target Date 05/12/21      PT SHORT TERM GOAL #3   Title Patient will tolerate standing up to 30 minutes without reproduction of pain as needed for completing household chores, community-level mobility, and social/community outings    Baseline Difficulty standing > 5 mins    Time 4    Period Weeks    Status New    Target Date  05/12/21             PT Long Term Goals - 04/14/21 1418      PT LONG TERM GOAL #1   Title Patient will demonstrate improved function as evidenced by a score of 56 on FOTO measure for full participation in activities at home and in the community.    Baseline FOTO 39    Time 8    Period Weeks    Status New    Target Date 06/09/21      PT LONG TERM GOAL #2   Title Patient will tolerate walking up to 1 hour without reproduction of pain in back as needed for community outings, grocery shopping, household and outdoor chores    Baseline Difficulty walking > 10 minutes    Time 8    Period Weeks    Status  New    Target Date 06/09/21      PT LONG TERM GOAL #3   Title Patient will perform up to 30 minutes of standing/walking activity in clinic without reproduction of back pain necessitating sitting break indicative of improved standing tolerance and ability to complete community-level mobility tasks, social outings, grocery shopping    Baseline Difficulty standing > 5 mins, walking > 10 mins    Time 8    Period Weeks    Status New    Target Date 06/09/21                 Plan - 04/19/21 1244    Clinical Impression Statement Patient tolerates treatment well today with mild tautness and tenderness along R L3-4 iliocostalis lumborum and R gluteal musculature. Patient demonstrates good understanding of his initial home exercises. He is able to progress modestly with lumbopelvic mobility work in clinic. Sound twitch response is obtained at R iliocostalis lumborum and R gluteus maximus with patient feeling minimal soreness and sensation of being more "loose" following treatment. Pt will benefit from continued skilled PT intervention to address remaining deficits in pain, mobility, and function as needed for best return to PLOF.    Personal Factors and Comorbidities Comorbidity 3+;Fitness   Hx of thoracoscopy for empyema, R side   Comorbidities obesity, Type 2 DM, Hx of COVID-19 infection  and persistent SOB, HTN, Hx of thoracoscopy for R empyema    Examination-Activity Limitations Stand;Locomotion Level;Sleep    Examination-Participation Restrictions Cleaning;Church;Community Activity;Shop    Stability/Clinical Decision Making Evolving/Moderate complexity    Rehab Potential Good    PT Frequency 2x / week    PT Duration 8 weeks    PT Treatment/Interventions Electrical Stimulation;Therapeutic activities;Therapeutic exercise;Neuromuscular re-education;Manual techniques;Dry needling;Patient/family education    PT Next Visit Plan Manual therapy including STM/DTM for densitization of affected paraspinals, flexion bias movement program, patient education on activity modification prn and progression of thoracolumbar ROM in clinic. Continue use of DN prn to address soft tissue TrPs along R>L lumbar erector spinae and gluteal mm.    PT Home Exercise Plan Access Code [email or web address]    Consulted and Agree with Plan of Care Patient           Patient will benefit from skilled therapeutic intervention in order to improve the following deficits and impairments:  Decreased endurance,Decreased mobility,Difficulty walking,Hypomobility,Pain,Impaired flexibility,Decreased strength,Decreased activity tolerance,Decreased range of motion  Visit Diagnosis: Acute right-sided low back pain without sciatica  Difficulty in walking, not elsewhere classified  Muscle weakness (generalized)     Problem List Patient Active Problem List   Diagnosis Date Noted  . 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 Postop check 01/21/2020  . Cough 01/08/2020  . Shortness of breath 01/08/2020  . Empyema of pleural space (HCC) 12/18/2019  . Chest tube in place   . ARDS (adult respiratory distress syndrome) (HCC)   . Empyema lung (HCC)   . Hydropneumothorax   . MSSA bacteremia 11/25/2019  . Constipation 11/17/2019  . Diabetes mellitus type 2, uncontrolled, with complications (HCC) 11/12/2019  . Acute pulmonary embolus (HCC)  11/12/2019  . Demand ischemia (HCC) 11/12/2019  . Acute respiratory failure with hypoxemia (HCC) 11/11/2019  . History of COVID-19 pneumonia 11/10/2019  . COVID-19 11/10/2019  . Acute respiratory failure (HCC) 11/09/2019  . Health care maintenance 07/29/2019  . Metatarsalgia of left foot 06/18/2019  . Prediabetes 05/08/2019  . Peripheral neuropathy 05/08/2019  . Osteoarthritis of both knees 05/08/2019  . Osteoarthritis of right knee 02/15/2017  .  Hand cramps 11/09/2016  . Tinea pedis 06/04/2016  . Low HDL (under 40) 05/09/2016  . High triglycerides 05/09/2016  . Medication monitoring encounter 05/03/2016  . Controlled type 2 diabetes mellitus without complication, without long-term current use of insulin (HCC) 01/04/2016  . Needs flu shot 01/04/2016  . Morbid obesity (HCC) 01/04/2016  . Screening for STD (sexually transmitted disease) 01/04/2016  . Encounter for cholesteral screening for cardiovascular disease 01/04/2016  . AD (atopic dermatitis) 06/09/2015  . Chronic low back pain 06/09/2015  . Hypertension 06/09/2015  . Type 2 diabetes, controlled, with peripheral neuropathy (HCC) 12/23/2014   Consuela Mimes, PT, DPT [phone] Gertie Exon 04/19/2021, 12:51 PM  [place] [facility] [address] [place], [place], [address] Phone: [phone]   Fax:  [phone]  Name: Nathaniel Webb MRN: [id number] Date of Birth: [birth year]

## 2021-04-21 ENCOUNTER — Ambulatory Visit: Payer: Medicaid Other | Admitting: Physical Therapy

## 2021-04-21 ENCOUNTER — Encounter: Payer: Self-pay | Admitting: Physical Therapy

## 2021-04-21 ENCOUNTER — Other Ambulatory Visit: Payer: Self-pay

## 2021-04-21 DIAGNOSIS — M545 Low back pain, unspecified: Secondary | ICD-10-CM | POA: Diagnosis not present

## 2021-04-21 DIAGNOSIS — R262 Difficulty in walking, not elsewhere classified: Secondary | ICD-10-CM

## 2021-04-21 DIAGNOSIS — M6281 Muscle weakness (generalized): Secondary | ICD-10-CM

## 2021-04-21 NOTE — Therapy (Signed)
[place] [facility] [address] [place], [place], [address] Phone: [phone]   Fax:  [phone]  Physical Therapy Treatment  Patient Details  Name: Nathaniel MagesJohnny R Webb MRN: [id number] Date of Birth: 03/26/1966 Referring Provider (PT): Miki KinsAmanda M Shirley, FNP   Encounter Date: 04/21/2021   PT End of Session - 04/22/21 1636     Visit Number 3    Number of Visits 13    Date for PT Re-Evaluation 06/09/21    Authorization Type UHC Medicaid VL 27 combined PT/OT/SLP per calendar year    Authorization Time Period Eval 04/14/2021    Authorization - Visit Number 3    Authorization - Number of Visits 10    Progress Note Due on Visit 10    PT Start Time 1415    PT Stop Time 1502    PT Time Calculation (min) 47 min    Activity Tolerance Patient tolerated treatment well;No increased pain;Patient limited by fatigue   Patient has SOB secondary to hold lung Sx and subsequent COVID-19 infection   Behavior [facility] for tasks assessed/performed             Past Medical History:  Diagnosis Date   Diabetes mellitus without complication (HCC)    Heel pain 06/09/2015   High triglycerides 05/09/2016   Hypertension    Low HDL (under 40) 05/09/2016   Obesity, Class II, BMI 35-39.9, with comorbidity (HCC) 01/04/2016    Past Surgical History:  Procedure Laterality Date   VIDEO ASSISTED THORACOSCOPY (VATS)/EMPYEMA Right 12/18/2019   Procedure: VIDEO ASSISTED THORACOSCOPY (VATS)/EMPYEMA;  Surgeon: Kerin PernaVan Trigt, Peter, MD;  [facility];  Service: Thoracic;  Laterality: Right;    There were no vitals filed for this visit.   Subjective Assessment - 04/21/21 1415     Subjective Patient reports ongoing pain on his R side with standing for prolonged period. Patient reports no pain at rest/in sitting. Patient reports compliance with his HEP. Patient reports some tossing and turning at night and pain with lying on R side. He feels that he responded well to dry  needling completed at last session.    Pertinent History Patient is a 55 year old male with primary complaint of low back pain with Hx of R lung surgery 2 years ago (unspecified) with some shortness of breath following this. SOB was also exacerbated with previous COVID-19 infection. Pt reports onset of low back pain about 2 months ago. Pt reports that when he starts walking, he has pain in lumbosacral region. Pt reports Hx of peripheral neuropathy for about 5 years. Pt denies new numbness or paresthesias. Patient reports difficulty getting to sleep; pt denies pain waking up from his sleep. Pt reports no change in bowel/bladder with onset of back pain. No recent imaging on low back. Patient reports no pain at rest. He reports 10/10 pain with standing and walking. Time of day does not affect pain. Pt does use rolling walker for prolonged walking; he will use grocery cart at grocery. 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    Limitations Standing;Walking    How long can you stand comfortably? 5 minutes    How long can you walk comfortably? 10 minutes    Patient Stated Goals Able to complete more physical activity    Pain Onset More than a month ago               Treatment Performed   Neuromuscular Re-education -  for lumbopelvic control, trunk stabilization Posterior pelvic tilt; hooklying; 2x10 [heavy verbal and tactile cues, demonstration] Open book; x10 each side   Therapeutic exercise - for thoracolumbar mobility, paraspinal and posterior LE chain soft tissue mobility, ROM Lower trunk rotation; x10 ea dir, hooklying Single knee to chest; x10 each LE, 1 sec hold Seated swiss ball rollout, Blue Swiss ball, 3-way (forward, diagonal L and R); x5, 5 sec each dir   Moderate verbal and tactile cueing and demonstration provided for each exercise performed to improve cadence, holding time, and exercise technique.   Manual therapy - for soft  tissue mobility and extensibility, tissue desensitization and pain modulation STM/DTM R>L erector spinae L1-L5 CPA L1-L5 gr I-II, R UPA gr I-II for pain control     Trigger Point Dry Needling (TDN), unbilled Education performed with patient regarding potential benefit of TDN. Reviewed precautions and risks with patient. Extensive time spent with pt to ensure full understanding of TDN risks. Pt provided verbal consent to treatment. TDN performed with 0.30 x 60 single needle placements with local twitch response (LTR) along R L3 and L4 iliocostalis lumborum, R gluteus maximus, and R L5 multifidus. Pistoning technique utilized. Improved R lower quarter pain and sensation of tightness following treatment.     ASSESSMENT Patient has no notable pain at rest or with exercises performed in unloaded position; however he has ongoing pain with moderate volume of standing with pt repoting increase in R-sided low back pain with standing at sink and brushing his teeth this AM. Patient has most notable sensivity along L2-L4 R iliocostalis lumborum. Sound twitch is obtained today with TDN with patient having good tolerance of thoracolumbar rotation work following treatment in gravity-eliminated position. Patient will benefit from continued skilled PT intervention to address remining deficits in pain, mobility, and function as needed for best return to PLOF.     PT Short Term Goals - 04/14/21 1413       PT SHORT TERM GOAL #1   Title Patient will be indepedent and 100% compliant with established HEP and activity modification as needed to augment PT intervention and prevent flare-up of patient condition.    Baseline No established HEP, home program provided at IE    Time 2    Period Weeks    Status New    Target Date 04/28/21      PT SHORT TERM GOAL #2   Title Patient will have full thoracolumbar AROM without reproduction of pain as needed for functional reaching, overhead activity, self-care ADLs     Baseline Difficulty with return to neutral with lumbar flexion. Pain with L-sided sidebend and rotation    Time 4    Period Weeks    Status New    Target Date 05/12/21      PT SHORT TERM GOAL #3   Title Patient will tolerate standing up to 30 minutes without reproduction of pain as needed for completing household chores, community-level mobility, and social/community outings    Baseline Difficulty standing > 5 mins    Time 4    Period Weeks    Status New    Target Date 05/12/21               PT Long Term Goals - 04/14/21 1418       PT LONG TERM GOAL #1   Title Patient will demonstrate improved function as evidenced by a score of 56 on FOTO measure for full participation in activities at home and in the community.  Baseline FOTO 39    Time 8    Period Weeks    Status New    Target Date 06/09/21      PT LONG TERM GOAL #2   Title Patient will tolerate walking up to 1 hour without reproduction of pain in back as needed for community outings, grocery shopping, household and outdoor chores    Baseline Difficulty walking > 10 minutes    Time 8    Period Weeks    Status New    Target Date 06/09/21      PT LONG TERM GOAL #3   Title Patient will perform up to 30 minutes of standing/walking activity in clinic without reproduction of back pain necessitating sitting break indicative of improved standing tolerance and ability to complete community-level mobility tasks, social outings, grocery shopping    Baseline Difficulty standing > 5 mins, walking > 10 mins    Time 8    Period Weeks    Status New    Target Date 06/09/21                   Plan - 04/22/21 1636     Clinical Impression Statement Patient has no notable pain at rest or with exercises performed in unloaded position; however he has ongoing pain with moderate volume of standing with pt repoting increase in R-sided low back pain with standing at sink and brushing his teeth this AM. Patient has most notable  sensivity along L2-L4 R iliocostalis lumborum. Sound twitch is obtained today with TDN with patient having good tolerance of thoracolumbar rotation work following treatment in gravity-eliminated position. Patient will benefit from continued skilled PT intervention to address remining deficits in pain, mobility, and function as needed for best return to PLOF.    Personal Factors and Comorbidities Comorbidity 3+;Fitness   Hx of thoracoscopy for empyema, R side   Comorbidities obesity, Type 2 DM, Hx of COVID-19 infection and persistent SOB, HTN, Hx of thoracoscopy for R empyema    Examination-Activity Limitations Stand;Locomotion Level;Sleep    Examination-Participation Restrictions Cleaning;Church;Community Activity;Shop    Stability/Clinical Decision Making Evolving/Moderate complexity    Rehab Potential Good    PT Frequency 2x / week    PT Duration 8 weeks    PT Treatment/Interventions Electrical Stimulation;Therapeutic activities;Therapeutic exercise;Neuromuscular re-education;Manual techniques;Dry needling;Patient/family education    PT Next Visit Plan Manual therapy including STM/DTM for densitization of affected paraspinals, flexion bias movement program, patient education on activity modification prn and progression of thoracolumbar ROM in clinic. Continue use of DN prn to address soft tissue TrPs along R>L lumbar erector spinae and gluteal mm.    PT Home Exercise Plan Access Code [email or web address]    Consulted and Agree with Plan of Care Patient             Patient will benefit from skilled therapeutic intervention in order to improve the following deficits and impairments:  Decreased endurance, Decreased mobility, Difficulty walking, Hypomobility, Pain, Impaired flexibility, Decreased strength, Decreased activity tolerance, Decreased range of motion  Visit Diagnosis: Acute right-sided low back pain without sciatica  Difficulty in walking, not elsewhere classified  Muscle weakness  (generalized)     Problem List Patient Active Problem List   Diagnosis Date Noted   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   Postop check 01/21/2020   Cough 01/08/2020   Shortness of breath 01/08/2020   Empyema of pleural space (HCC) 12/18/2019   Chest tube in place    ARDS (adult respiratory distress syndrome) (HCC)  Empyema lung (HCC)    Hydropneumothorax    MSSA bacteremia 11/25/2019   Constipation 11/17/2019   Diabetes mellitus type 2, uncontrolled, with complications (HCC) 11/12/2019   Acute pulmonary embolus (HCC) 11/12/2019   Demand ischemia (HCC) 11/12/2019   Acute respiratory failure with hypoxemia (HCC) 11/11/2019   History of COVID-19 pneumonia 11/10/2019   COVID-19 11/10/2019   Acute respiratory failure (HCC) 11/09/2019   Health care maintenance 07/29/2019   Metatarsalgia of left foot 06/18/2019   Prediabetes 05/08/2019   Peripheral neuropathy 05/08/2019   Osteoarthritis of both knees 05/08/2019   Osteoarthritis of right knee 02/15/2017   Hand cramps 11/09/2016   Tinea pedis 06/04/2016   Low HDL (under 40) 05/09/2016   High triglycerides 05/09/2016   Medication monitoring encounter 05/03/2016   Controlled type 2 diabetes mellitus without complication, without long-term current use of insulin (HCC) 01/04/2016   Needs flu shot 01/04/2016   Morbid obesity (HCC) 01/04/2016   Screening for STD (sexually transmitted disease) 01/04/2016   Encounter for cholesteral screening for cardiovascular disease 01/04/2016   AD (atopic dermatitis) 06/09/2015   Chronic low back pain 06/09/2015   Hypertension 06/09/2015   Type 2 diabetes, controlled, with peripheral neuropathy (HCC) 12/23/2014   Consuela Mimes, PT, DPT [id number] Gertie Exon 04/22/2021, 4:44 PM  [place] [facility] [address] [place], [place], [address] Phone: [phone]   Fax:  [phone]  Name: Nathaniel Webb MRN: [id number] Date of Birth: Jul 21, 1966

## 2021-04-26 ENCOUNTER — Ambulatory Visit: Payer: Medicaid Other | Admitting: Physical Therapy

## 2021-04-26 ENCOUNTER — Other Ambulatory Visit: Payer: Self-pay

## 2021-04-26 DIAGNOSIS — R262 Difficulty in walking, not elsewhere classified: Secondary | ICD-10-CM

## 2021-04-26 DIAGNOSIS — M545 Low back pain, unspecified: Secondary | ICD-10-CM

## 2021-04-26 DIAGNOSIS — M6281 Muscle weakness (generalized): Secondary | ICD-10-CM

## 2021-04-26 NOTE — Therapy (Signed)
[place] [facility] [address] [place], [place], [address] Phone: [phone]   Fax:  [phone]  Physical Therapy Treatment  Patient Details  Name: Nathaniel Webb MRN: [id number] Date of Birth: [birth year] Referring Provider (PT): Miki Kins, FNP   Encounter Date: 04/26/2021   PT End of Session - 04/27/21 1652     Visit Number 4    Number of Visits 13    Date for PT Re-Evaluation 06/09/21    Authorization Type [employer] Medicaid VL 27 combined PT/OT/SLP per calendar year    Authorization Time Period Eval 04/14/2021    Authorization - Visit Number 4    Authorization - Number of Visits 10    Progress Note Due on Visit 10    PT Start Time 1122    PT Stop Time 1208    PT Time Calculation (min) 46 min    Activity Tolerance Patient tolerated treatment well;No increased pain;Patient limited by fatigue   Patient has SOB secondary to hold lung Sx and subsequent COVID-19 infection   Behavior During Therapy [facility] for tasks assessed/performed             Past Medical History:  Diagnosis Date   Diabetes mellitus without complication (HCC)    Heel pain 06/09/2015   High triglycerides 05/09/2016   Hypertension    Low HDL (under 40) 05/09/2016   Obesity, Class II, BMI 35-39.9, with comorbidity (HCC) 01/04/2016    Past Surgical History:  Procedure Laterality Date   VIDEO ASSISTED THORACOSCOPY (VATS)/EMPYEMA Right 12/18/2019   Procedure: VIDEO ASSISTED THORACOSCOPY (VATS)/EMPYEMA;  Surgeon: Kerin Perna, MD;  Location: [facility] OR;  Service: Thoracic;  Laterality: Right;    There were no vitals filed for this visit.   Subjective Assessment - 04/26/21 1128     Subjective Patient reports pain with standing > 10 minutes. He denies notable pain when he is sitting. Patient reports complinace with his HEP. Patient states he was walking up to 10 minutes yesterday - he was limited by shortness of breath.    Pertinent History Patient is a 55 year old male  with primary complaint of low back pain with Hx of R lung surgery 2 years ago (unspecified) with some shortness of breath following this. SOB was also exacerbated with previous COVID-19 infection. Pt reports onset of low back pain about 2 months ago. Pt reports that when he starts walking, he has pain in lumbosacral region. Pt reports Hx of peripheral neuropathy for about 5 years. Pt denies new numbness or paresthesias. Patient reports difficulty getting to sleep; pt denies pain waking up from his sleep. Pt reports no change in bowel/bladder with onset of back pain. No recent imaging on low back. Patient reports no pain at rest. He reports 10/10 pain with standing and walking. Time of day does not affect pain. Pt does use rolling walker for prolonged walking; he will use grocery cart at grocery. 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    Limitations Standing;Walking    How long can you stand comfortably? 10 minutes    How long can you walk comfortably? 10 minutes    Patient Stated Goals Able to complete more physical activity    Pain Onset More than a month ago               Treatment Performed    Neuromuscular Re-education - for lumbopelvic control, trunk stabilization Posterior pelvic tilt; seated on blue physioball; 2x10 [  heavy verbal and tactile cues, demonstration] Open book; x10 each side   Therapeutic exercise - for thoracolumbar mobility, paraspinal and posterior LE chain soft tissue mobility, ROM Lower trunk rotation, with QL bias (LE in figure-4 position); x10 ea side Seated swiss ball rollout, Blue Swiss ball, 3-way (forward, diagonal L and R); x5, 5 sec each dir   Moderate verbal and tactile cueing and demonstration provided for each exercise performed to improve cadence, holding time, and exercise technique.  *not today* Single knee to chest; x10 each LE, 1 sec hold   Manual therapy - for soft tissue mobility and  extensibility, tissue desensitization and pain modulation STM/DTM R>L erector spinae L1-L5 CPA L1-L5 gr I-II, R UPA gr I-II for pain control     Trigger Point Dry Needling (TDN), unbilled Education performed with patient regarding potential benefit of TDN. Reviewed precautions and risks with patient. Extensive time spent with pt to ensure full understanding of TDN risks. Pt provided verbal consent to treatment. TDN performed with 0.30 x 60 single needle placements with local twitch response (LTR) along right and left L3 iliocostalis lumborum. Pistoning technique utilized. Improved sensation of tightness following treatment.        ASSESSMENT Patient tolerates recent progression of flexion-bias exercises well; however, patient has ongoing pain with prolonged standing activity. He denies pain in sitting and at rest in clinic. Patient does have taut and tender L3 iliocostalis lumborum bilaterally with more sensitivity on R side. Sound twitch is obtained on each side with improved sensation of tightness following TDN. Patient reports feeling better with successive visits in PT, but he is still very functionally limited with prolonged upight activity. Activity tolerance is also limited by shortness of breath. Patient has made fair response to date and will benefit from continued skilled therapeutic intervention to address remaining pain, ROM deficits, and standing/walking actvity limitations as needed for improved QoL and return to patient's PLOF.         PT Short Term Goals - 04/14/21 1413       PT SHORT TERM GOAL #1   Title Patient will be indepedent and 100% compliant with established HEP and activity modification as needed to augment PT intervention and prevent flare-up of patient condition.    Baseline No established HEP, home program provided at IE    Time 2    Period Weeks    Status New    Target Date 04/28/21      PT SHORT TERM GOAL #2   Title Patient will have full thoracolumbar AROM  without reproduction of pain as needed for functional reaching, overhead activity, self-care ADLs    Baseline Difficulty with return to neutral with lumbar flexion. Pain with L-sided sidebend and rotation    Time 4    Period Weeks    Status New    Target Date 05/12/21      PT SHORT TERM GOAL #3   Title Patient will tolerate standing up to 30 minutes without reproduction of pain as needed for completing household chores, community-level mobility, and social/community outings    Baseline Difficulty standing > 5 mins    Time 4    Period Weeks    Status New    Target Date 05/12/21               PT Long Term Goals - 04/14/21 1418       PT LONG TERM GOAL #1   Title Patient will demonstrate improved function as evidenced by a score of  56 on FOTO measure for full participation in activities at home and in the community.    Baseline FOTO 39    Time 8    Period Weeks    Status New    Target Date 06/09/21      PT LONG TERM GOAL #2   Title Patient will tolerate walking up to 1 hour without reproduction of pain in back as needed for community outings, grocery shopping, household and outdoor chores    Baseline Difficulty walking > 10 minutes    Time 8    Period Weeks    Status New    Target Date 06/09/21      PT LONG TERM GOAL #3   Title Patient will perform up to 30 minutes of standing/walking activity in clinic without reproduction of back pain necessitating sitting break indicative of improved standing tolerance and ability to complete community-level mobility tasks, social outings, grocery shopping    Baseline Difficulty standing > 5 mins, walking > 10 mins    Time 8    Period Weeks    Status New    Target Date 06/09/21                 Plan - 04/27/21 1658     Clinical Impression Statement Patient tolerates recent progression of flexion-bias exercises well; however, patient has ongoing pain with prolonged standing activity. He denies pain in sitting and at rest in  clinic. Patient does have taut and tender L3 iliocostalis lumborum bilaterally with more sensitivity on R side. Sound twitch is obtained on each side with improved sensation of tightness following TDN. Patient reports feeling better with successive visits in PT, but he is still very functionally limited with prolonged upight activity. Activity tolerance is also limited by shortness of breath. Patient has made fair response to date and will benefit from continued skilled therapeutic intervention to address remaining pain, ROM deficits, and standing/walking actvity limitations as needed for improved QoL and return to patient's PLOF.    Personal Factors and Comorbidities Comorbidity 3+;Fitness   Hx of thoracoscopy for empyema, R side   Comorbidities obesity, Type 2 DM, Hx of COVID-19 infection and persistent SOB, HTN, Hx of thoracoscopy for R empyema    Examination-Activity Limitations Stand;Locomotion Level;Sleep    Examination-Participation Restrictions Cleaning;Church;Community Activity;Shop    Stability/Clinical Decision Making Evolving/Moderate complexity    Rehab Potential Good    PT Frequency 2x / week    PT Duration 8 weeks    PT Treatment/Interventions Electrical Stimulation;Therapeutic activities;Therapeutic exercise;Neuromuscular re-education;Manual techniques;Dry needling;Patient/family education    PT Next Visit Plan Manual therapy including STM/DTM for densitization of affected paraspinals, flexion bias movement program, patient education on activity modification prn and progression of thoracolumbar ROM in clinic. Continue use of DN prn to address soft tissue TrPs along R>L lumbar erector spinae and gluteal mm.    PT Home Exercise Plan Access Code [email or web address]    Consulted and Agree with Plan of Care Patient             Patient will benefit from skilled therapeutic intervention in order to improve the following deficits and impairments:  Decreased endurance, Decreased mobility, Difficulty  walking, Hypomobility, Pain, Impaired flexibility, Decreased strength, Decreased activity tolerance, Decreased range of motion  Visit Diagnosis: Acute right-sided low back pain without sciatica  Difficulty in walking, not elsewhere classified  Muscle weakness (generalized)     Problem List Patient Active Problem List   Diagnosis Date Noted   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  Postop check 01/21/2020   Cough 01/08/2020   Shortness of breath 01/08/2020   Empyema of pleural space (HCC) 12/18/2019   Chest tube in place    ARDS (adult respiratory distress syndrome) (HCC)    Empyema lung (HCC)    Hydropneumothorax    MSSA bacteremia 11/25/2019   Constipation 11/17/2019   Diabetes mellitus type 2, uncontrolled, with complications (HCC) 11/12/2019   Acute pulmonary embolus (HCC) 11/12/2019   Demand ischemia (HCC) 11/12/2019   Acute respiratory failure with hypoxemia (HCC) 11/11/2019   History of COVID-19 pneumonia 11/10/2019   COVID-19 11/10/2019   Acute respiratory failure (HCC) 11/09/2019   Health care maintenance 07/29/2019   Metatarsalgia of left foot 06/18/2019   Prediabetes 05/08/2019   Peripheral neuropathy 05/08/2019   Osteoarthritis of both knees 05/08/2019   Osteoarthritis of right knee 02/15/2017   Hand cramps 11/09/2016   Tinea pedis 06/04/2016   Low HDL (under 40) 05/09/2016   High triglycerides 05/09/2016   Medication monitoring encounter 05/03/2016   Controlled type 2 diabetes mellitus without complication, without long-term current use of insulin (HCC) 01/04/2016   Needs flu shot 01/04/2016   Morbid obesity (HCC) 01/04/2016   Screening for STD (sexually transmitted disease) 01/04/2016   Encounter for cholesteral screening for cardiovascular disease 01/04/2016   AD (atopic dermatitis) 06/09/2015   Chronic low back pain 06/09/2015   Hypertension 06/09/2015   Type 2 diabetes, controlled, with peripheral neuropathy (HCC) 12/23/2014   Consuela MimesJeremy Durrell Barajas, PT, DPT  #[id number] Gertie ExonJeremy T Karthika Glasper 04/27/2021, 6:38 PM  [facility] [address] [place], [place], [address] Phone: [phone]   Fax:  [phone]  Name: Nathaniel MagesJohnny R Webb MRN: [id number] Date of Birth: 02/18/1966

## 2021-05-03 ENCOUNTER — Ambulatory Visit: Payer: Medicaid Other | Admitting: Physical Therapy

## 2021-05-03 ENCOUNTER — Other Ambulatory Visit: Payer: Self-pay

## 2021-05-03 DIAGNOSIS — R262 Difficulty in walking, not elsewhere classified: Secondary | ICD-10-CM

## 2021-05-03 DIAGNOSIS — M545 Low back pain, unspecified: Secondary | ICD-10-CM

## 2021-05-03 DIAGNOSIS — M6281 Muscle weakness (generalized): Secondary | ICD-10-CM

## 2021-05-03 NOTE — Therapy (Signed)
[facility] [address] [place], [place], [address] Phone: [phone]   Fax:  [phone]  Physical Therapy Treatment  Patient Details  Name: Nathaniel Webb MRN: [id number] Date of Birth: 06/05/1966 Referring Provider (PT): Miki KinsAmanda M Shirley, FNP   Encounter Date: 05/03/2021   PT End of Session - 05/03/21 1148     Visit Number 5    Number of Visits 13    Date for PT Re-Evaluation 06/09/21    Authorization Type [employer] Medicaid VL 27 combined PT/OT/SLP per calendar year    Authorization Time Period Eval 04/14/2021    Authorization - Visit Number 5    Authorization - Number of Visits 10    Progress Note Due on Visit 10    PT Start Time 1118    PT Stop Time 1201    PT Time Calculation (min) 43 min    Activity Tolerance Patient tolerated treatment well;No increased pain;Patient limited by fatigue   Patient has SOB secondary to hold lung Sx and subsequent COVID-19 infection   [facility] tasks assessed/performed             Past Medical History:  Diagnosis Date   Diabetes mellitus without complication (HCC)    Heel pain 06/09/2015   High triglycerides 05/09/2016   Hypertension    Low HDL (under 40) 05/09/2016   Obesity, Class II, BMI 35-39.9, with comorbidity (HCC) 01/04/2016    Past Surgical History:  Procedure Laterality Date   VIDEO ASSISTED THORACOSCOPY (VATS)/EMPYEMA Right 12/18/2019   Procedure: VIDEO ASSISTED THORACOSCOPY (VATS)/EMPYEMA;  Surgeon: Kerin PernaVan Trigt, Peter, MD;  [facility];  Service: Thoracic;  Laterality: Right;    There were no vitals filed for this visit.   Subjective Assessment - 05/03/21 1120     Subjective Patient reports ongoing pain with standing > 10 minutes. He reports no pain at arrival. He reports he can walk about 10 minutes usually. He does have shortness of breath that also limits walking activity. Patient reports compliance with his home exercise program.    Pertinent  History Patient is a 55 year old male with primary complaint of low back pain with Hx of R lung surgery 2 years ago (unspecified) with some shortness of breath following this. SOB was also exacerbated with previous COVID-19 infection. Pt reports onset of low back pain about 2 months ago. Pt reports that when he starts walking, he has pain in lumbosacral region. Pt reports Hx of peripheral neuropathy for about 5 years. Pt denies new numbness or paresthesias. Patient reports difficulty getting to sleep; pt denies pain waking up from his sleep. Pt reports no change in bowel/bladder with onset of back pain. No recent imaging on low back. Patient reports no pain at rest. He reports 10/10 pain with standing and walking. Time of day does not affect pain. Pt does use rolling walker for prolonged walking; he will use grocery cart at grocery. 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    Limitations Standing;Walking    How long can you stand comfortably? 10 minutes    How long can you walk comfortably? 10 minutes    Patient Stated Goals Able to complete more physical activity    Pain Onset More than a month ago               Treatment Performed    Neuromuscular Re-education - for lumbopelvic control, trunk stabilization Posterior pelvic tilt; seated on  blue physioball; 2x10 [heavy verbal and tactile cues, demonstration] Posterior pelvic tilt with Bridge; x10 Nautilus core walkout, belt around waist; posterior only today, length of agility ladder; x5 D/B  *not today* Open book; x10 each side   Therapeutic exercise - for thoracolumbar mobility, paraspinal and posterior LE chain soft tissue mobility, ROM Lower trunk rotation, with QL bias (LE in figure-4 position); x10 ea side Seated swiss ball rollout, Blue Swiss ball, 3-way (forward, diagonal L and R); x5, 5 sec each dir   Moderate verbal and tactile cueing and demonstration provided for each  exercise performed to improve cadence, holding time, and exercise technique.   *not today* Single knee to chest; x10 each LE, 1 sec hold   Manual therapy - for soft tissue mobility and extensibility, tissue desensitization and pain modulation STM/DTM R>L erector spinae L1-L5 CPA L1-L5 gr I-II, R UPA gr I-II for pain control     Trigger Point Dry Needling (TDN), unbilled Education performed with patient regarding potential benefit of TDN. Reviewed precautions and risks with patient. Extensive time spent with pt to ensure full understanding of TDN risks. Pt provided verbal consent to treatment. TDN performed with 0.30 x 60 single needle placements with local twitch response (LTR) at R L3-4 iliocostalis lumborum, R L3  and L4 multifidus. Pistoning technique utilized for iliocostalis lumborum only. Improved sensation of tightness following treatment.        ASSESSMENT Patient has sound twitch response and mild concordant pain reproduced during TDN today. He has minimal pain in unloaded and sitting positions. Patient is still functionally limited with standing activity. Modestly progressed volume of upright activity including standing core/paraspinal isometrics. Patient reports mild reproduction of pain during core walkout with Nautilus machine. Will continue working on building resiliency and graded exposure to upright and standing activities as tolerated to reduce perceived threat of standing activity. Activity tolerance is also limited by shortness of breath. Patient has made fair progress to date and will benefit from continued skilled therapeutic intervention to address remaining pain, ROM deficits, and standing/walking actvity limitations as needed for improved QoL and return to patient's PLOF.        PT Short Term Goals - 05/04/21 1333       PT SHORT TERM GOAL #1   Title Patient will be indepedent and 100% compliant with established HEP and activity modification as needed to augment PT  intervention and prevent flare-up of patient condition.    Baseline 05/04/21: Full compliance with home program    Time 2    Period Weeks    Status Achieved    Target Date 04/28/21      PT SHORT TERM GOAL #2   Title Patient will have full thoracolumbar AROM without reproduction of pain as needed for functional reaching, overhead activity, self-care ADLs    Baseline Difficulty with return to neutral with lumbar flexion. Pain with L-sided sidebend and rotation    Time 4    Period Weeks    Status On-going    Target Date 05/12/21      PT SHORT TERM GOAL #3   Title Patient will tolerate standing up to 30 minutes without reproduction of pain as needed for completing household chores, community-level mobility, and social/community outings    Baseline Difficulty standing > 5 mins    Time 4    Period Weeks    Status New    Target Date 05/12/21               PT  Long Term Goals - 04/14/21 1418       PT LONG TERM GOAL #1   Title Patient will demonstrate improved function as evidenced by a score of 56 on FOTO measure for full participation in activities at home and in the community.    Baseline FOTO 39    Time 8    Period Weeks    Status New    Target Date 06/09/21      PT LONG TERM GOAL #2   Title Patient will tolerate walking up to 1 hour without reproduction of pain in back as needed for community outings, grocery shopping, household and outdoor chores    Baseline Difficulty walking > 10 minutes    Time 8    Period Weeks    Status New    Target Date 06/09/21      PT LONG TERM GOAL #3   Title Patient will perform up to 30 minutes of standing/walking activity in clinic without reproduction of back pain necessitating sitting break indicative of improved standing tolerance and ability to complete community-level mobility tasks, social outings, grocery shopping    Baseline Difficulty standing > 5 mins, walking > 10 mins    Time 8    Period Weeks    Status New    Target Date  06/09/21                   Plan - 05/04/21 1332     Clinical Impression Statement Patient has sound twitch response and mild concordant pain reproduced during TDN today. He has minimal pain in unloaded and sitting positions. Patient is still functionally limited with standing activity. Modestly progressed volume of upright activity including standing core/paraspinal isometrics. Patient reports mild reproduction of pain during core walkout with Nautilus machine. Will continue working on building resiliency and graded exposure to upright and standing activities as tolerated to reduce perceived threat of standing activity. Activity tolerance is also limited by shortness of breath. Patient has made fair progress to date and will benefit from continued skilled therapeutic intervention to address remaining pain, ROM deficits, and standing/walking actvity limitations as needed for improved QoL and return to patient's PLOF.    Personal Factors and Comorbidities Comorbidity 3+;Fitness   Hx of thoracoscopy for empyema, R side   Comorbidities obesity, Type 2 DM, Hx of COVID-19 infection and persistent SOB, HTN, Hx of thoracoscopy for R empyema    Examination-Activity Limitations Stand;Locomotion Level;Sleep    Examination-Participation Restrictions Cleaning;Church;Community Activity;Shop    Stability/Clinical Decision Making Evolving/Moderate complexity    Rehab Potential Good    PT Frequency 2x / week    PT Duration 8 weeks    PT Treatment/Interventions Electrical Stimulation;Therapeutic activities;Therapeutic exercise;Neuromuscular re-education;Manual techniques;Dry needling;Patient/family education    PT Next Visit Plan Manual therapy including STM/DTM for densitization of affected paraspinals, flexion bias movement program, patient education on activity modification prn and progression of thoracolumbar ROM in clinic. Continue use of DN prn to address soft tissue TrPs along R>L lumbar erector spinae  and gluteal mm. Increase emphasis on strengthening and standing activities as able.    PT Home Exercise Plan Access Code [email or web address]    Consulted and Agree with Plan of Care Patient             Patient will benefit from skilled therapeutic intervention in order to improve the following deficits and impairments:  Decreased endurance, Decreased mobility, Difficulty walking, Hypomobility, Pain, Impaired flexibility, Decreased strength, Decreased activity tolerance, Decreased range of motion  Visit  Diagnosis: Acute right-sided low back pain without sciatica  Difficulty in walking, not elsewhere classified  Muscle weakness (generalized)     Problem List Patient Active Problem List   Diagnosis Date Noted   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   Postop check 01/21/2020   Cough 01/08/2020   Shortness of breath 01/08/2020   Empyema of pleural space (HCC) 12/18/2019   Chest tube in place    ARDS (adult respiratory distress syndrome) (HCC)    Empyema lung (HCC)    Hydropneumothorax    MSSA bacteremia 11/25/2019   Constipation 11/17/2019   Diabetes mellitus type 2, uncontrolled, with complications (HCC) 11/12/2019   Acute pulmonary embolus (HCC) 11/12/2019   Demand ischemia (HCC) 11/12/2019   Acute respiratory failure with hypoxemia (HCC) 11/11/2019   History of COVID-19 pneumonia 11/10/2019   COVID-19 11/10/2019   Acute respiratory failure (HCC) 11/09/2019   Health care maintenance 07/29/2019   Metatarsalgia of left foot 06/18/2019   Prediabetes 05/08/2019   Peripheral neuropathy 05/08/2019   Osteoarthritis of both knees 05/08/2019   Osteoarthritis of right knee 02/15/2017   Hand cramps 11/09/2016   Tinea pedis 06/04/2016   Low HDL (under 40) 05/09/2016   High triglycerides 05/09/2016   Medication monitoring encounter 05/03/2016   Controlled type 2 diabetes mellitus without complication, without long-term current use of insulin (HCC) 01/04/2016   Needs flu shot 01/04/2016   Morbid  obesity (HCC) 01/04/2016   Screening for STD (sexually transmitted disease) 01/04/2016   Encounter for cholesteral screening for cardiovascular disease 01/04/2016   AD (atopic dermatitis) 06/09/2015   Chronic low back pain 06/09/2015   Hypertension 06/09/2015   Type 2 diabetes, controlled, with peripheral neuropathy (HCC) 12/23/2014   Consuela Mimes, PT, DPT #[id number] Gertie Exon 05/04/2021, 1:36 PM  [facility] [address] [place], [place], [address] Phone: [phone]   Fax:  [phone]  Name: Nathaniel Webb MRN: [id number] Date of Birth: 1966-06-03

## 2021-05-04 ENCOUNTER — Encounter: Payer: Self-pay | Admitting: Physical Therapy

## 2021-05-05 ENCOUNTER — Other Ambulatory Visit: Payer: Self-pay

## 2021-05-05 ENCOUNTER — Ambulatory Visit: Payer: Medicaid Other | Admitting: Physical Therapy

## 2021-05-05 ENCOUNTER — Encounter: Payer: Self-pay | Admitting: Physical Therapy

## 2021-05-05 DIAGNOSIS — R262 Difficulty in walking, not elsewhere classified: Secondary | ICD-10-CM

## 2021-05-05 DIAGNOSIS — M545 Low back pain, unspecified: Secondary | ICD-10-CM

## 2021-05-05 DIAGNOSIS — M6281 Muscle weakness (generalized): Secondary | ICD-10-CM

## 2021-05-05 NOTE — Therapy (Signed)
[facility] [address] [place], [place], [address] Phone: [phone]   Fax:  [phone]  Physical Therapy Treatment  Patient Details  Name: LENNY FIUMARA MRN: [id number] Date of Birth: 07/10/1966 Referring Provider (PT): Miki Kins, FNP   Encounter Date: 05/05/2021   PT End of Session - 05/05/21 1039     Visit Number 6    Number of Visits 13    Date for PT Re-Evaluation 06/09/21    Authorization Type [employer] Medicaid VL 27 combined PT/OT/SLP per calendar year    Authorization Time Period Eval 04/14/2021    Authorization - Visit Number 6    Authorization - Number of Visits 10    Progress Note Due on Visit 10    PT Start Time 2235    PT Stop Time 2317    PT Time Calculation (min) 42 min    Activity Tolerance Patient tolerated treatment well;No increased pain;Patient limited by fatigue   Patient has SOB secondary to hold lung Sx and subsequent COVID-19 infection   Behavior During Therapy [facility] for tasks assessed/performed             Past Medical History:  Diagnosis Date   Diabetes mellitus without complication (HCC)    Heel pain 06/09/2015   High triglycerides 05/09/2016   Hypertension    Low HDL (under 40) 05/09/2016   Obesity, Class II, BMI 35-39.9, with comorbidity (HCC) 01/04/2016    Past Surgical History:  Procedure Laterality Date   VIDEO ASSISTED THORACOSCOPY (VATS)/EMPYEMA Right 12/18/2019   Procedure: VIDEO ASSISTED THORACOSCOPY (VATS)/EMPYEMA;  Surgeon: Kerin Perna, MD;  Location: [facility] OR;  Service: Thoracic;  Laterality: Right;    There were no vitals filed for this visit.   Subjective Assessment - 05/05/21 1036     Subjective Patient reports difficulty with walking is still present. He states he has ongoing pain in his low back with standing work in his home (e.g. pain when standing at sink brushing teeth). Patient reports no major soreness after his last visit. Patient reports compliance with his HEP.  Patient reports comorbid L ankle edema and peripheral neuropathy contributes to difficulty with standing. Patient reports he has attempted using compressive garments before, but he feels that this did not help.    Pertinent History Patient is a 55 year old male with primary complaint of low back pain with Hx of R lung surgery 2 years ago (unspecified) with some shortness of breath following this. SOB was also exacerbated with previous COVID-19 infection. Pt reports onset of low back pain about 2 months ago. Pt reports that when he starts walking, he has pain in lumbosacral region. Pt reports Hx of peripheral neuropathy for about 5 years. Pt denies new numbness or paresthesias. Patient reports difficulty getting to sleep; pt denies pain waking up from his sleep. Pt reports no change in bowel/bladder with onset of back pain. No recent imaging on low back. Patient reports no pain at rest. He reports 10/10 pain with standing and walking. Time of day does not affect pain. Pt does use rolling walker for prolonged walking; he will use grocery cart at grocery. 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    Limitations Standing;Walking    How long can you stand comfortably? 10 minutes    How long can you walk comfortably? 10 minutes    Patient Stated Goals Able to complete more physical activity    Currently in Pain? No/denies  Pain Onset More than a month ago                 Treatment Performed    Neuromuscular Re-education - for lumbopelvic control, trunk stabilization Posterior pelvic tilt; seated on blue physioball; 2x10 [heavy verbal and tactile cues, demonstration] Posterior pelvic tilt with Bridge; x15 Standing minisquat; 2x10 [verbal cueing and demo for neutral spine and upright posture]    *not today* Nautilus core walkout, belt around waist; posterior only today, length of agility ladder; x5 D/B Open book; x10 each side   Therapeutic  exercise - for thoracolumbar mobility, paraspinal and posterior LE chain soft tissue mobility, ROM Prone knee bend, x20 bilateral      *not today* Seated swiss ball rollout, Blue Swiss ball, 3-way (forward, diagonal L and R); x5, 5 sec each dir Lower trunk rotation, with QL bias (LE in figure-4 position); x10 ea side Single knee to chest; x10 each LE, 1 sec hold     Manual therapy - for soft tissue mobility and extensibility, tissue desensitization and pain modulation STM/DTM R>L erector spinae L2-L5 CPA L3-L4 gr I-II, R UPA L3-4 gr I-II for pain control       ASSESSMENT Patient has ongoing pain with standing and walking that he feels hasn't changed at this point. He felt subjective improvement in tightness and discomfort in low back with manual therapy and dry needling, but he is still notably limited with volume of upright activity with patient only tolerating upright activity around 10 minutes. Patient is also significantly functionally limited from deconditioning and shortness of breath associated with history of thoracic empyema and thoracoscopy and associated hospitalization as well as COVID-19 infection afterward. Held on dry needling today due to ongoing primary complaints of pain with standing/walking in spite of treatment response in clinic. Will emphasize exercise and strengthening moving forward to promote resiliency, improve endurance, and reduce perceived threat of upright activity. Patient has made modest progress to date and will benefit from continued skilled therapeutic intervention to address remaining pain, ROM deficits, and standing/walking actvity limitations as needed for improved QoL.    PT Education - 05/06/21 0934     Education Details Discussed use of compressive garments and positional strategies for edema and follow-up with physician regarding medical management for symptoms of neuropathy.    Person(s) Educated Patient    Methods Explanation    Comprehension  Verbalized understanding              PT Short Term Goals - 05/04/21 1333       PT SHORT TERM GOAL #1   Title Patient will be indepedent and 100% compliant with established HEP and activity modification as needed to augment PT intervention and prevent flare-up of patient condition.    Baseline 05/04/21: Full compliance with home program    Time 2    Period Weeks    Status Achieved    Target Date 04/28/21      PT SHORT TERM GOAL #2   Title Patient will have full thoracolumbar AROM without reproduction of pain as needed for functional reaching, overhead activity, self-care ADLs    Baseline Difficulty with return to neutral with lumbar flexion. Pain with L-sided sidebend and rotation    Time 4    Period Weeks    Status On-going    Target Date 05/12/21      PT SHORT TERM GOAL #3   Title Patient will tolerate standing up to 30 minutes without reproduction of pain as needed  for completing household chores, community-level mobility, and social/community outings    Baseline Difficulty standing > 5 mins    Time 4    Period Weeks    Status New    Target Date 05/12/21               PT Long Term Goals - 04/14/21 1418       PT LONG TERM GOAL #1   Title Patient will demonstrate improved function as evidenced by a score of 56 on FOTO measure for full participation in activities at home and in the community.    Baseline FOTO 39    Time 8    Period Weeks    Status New    Target Date 06/09/21      PT LONG TERM GOAL #2   Title Patient will tolerate walking up to 1 hour without reproduction of pain in back as needed for community outings, grocery shopping, household and outdoor chores    Baseline Difficulty walking > 10 minutes    Time 8    Period Weeks    Status New    Target Date 06/09/21      PT LONG TERM GOAL #3   Title Patient will perform up to 30 minutes of standing/walking activity in clinic without reproduction of back pain necessitating sitting break indicative of  improved standing tolerance and ability to complete community-level mobility tasks, social outings, grocery shopping    Baseline Difficulty standing > 5 mins, walking > 10 mins    Time 8    Period Weeks    Status New    Target Date 06/09/21                   Plan - 05/06/21 0936     Clinical Impression Statement Patient has ongoing pain with standing and walking that he feels hasn't changed at this point. He felt subjective improvement in tightness and discomfort in low back with manual therapy and dry needling, but he is still notably limited with volume of upright activity with patient only tolerating upright activity around 10 minutes. Patient is also significantly functionally limited from deconditioning and shortness of breath associated with history of thoracic empyema and thoracoscopy and associated hospitalization as well as COVID-19 infection afterward. Held on dry needling today due to ongoing primary complaints of pain with standing/walking in spite of treatment response in clinic. Will emphasize exercise and strengthening moving forward to promote resiliency and reduce perceived threat of upright activity. Patient has made modest progress to date and will benefit from continued skilled therapeutic intervention to address remaining pain, ROM deficits, and standing/walking actvity limitations as needed for improved QoL.    Personal Factors and Comorbidities Comorbidity 3+;Fitness   Hx of thoracoscopy for empyema, R side   Comorbidities obesity, Type 2 DM, Hx of COVID-19 infection and persistent SOB, HTN, Hx of thoracoscopy for R empyema    Examination-Activity Limitations Stand;Locomotion Level;Sleep    Examination-Participation Restrictions Cleaning;Church;Community Activity;Shop    Stability/Clinical Decision Making Evolving/Moderate complexity    Rehab Potential Good    PT Frequency 2x / week    PT Duration 8 weeks    PT Treatment/Interventions Electrical  Stimulation;Therapeutic activities;Therapeutic exercise;Neuromuscular re-education;Manual techniques;Dry needling;Patient/family education    PT Next Visit Plan Manual therapy including STM/DTM for densitization of affected paraspinals, flexion bias movement program, patient education on activity modification prn and progression of thoracolumbar ROM in clinic. Increase emphasis on strengthening and standing activities as able.    PT  Home Exercise Plan Access Code [email or web address]    Consulted and Agree with Plan of Care Patient             Patient will benefit from skilled therapeutic intervention in order to improve the following deficits and impairments:  Decreased endurance, Decreased mobility, Difficulty walking, Hypomobility, Pain, Impaired flexibility, Decreased strength, Decreased activity tolerance, Decreased range of motion  Visit Diagnosis: Acute right-sided low back pain without sciatica  Difficulty in walking, not elsewhere classified  Muscle weakness (generalized)     Problem List Patient Active Problem List   Diagnosis Date Noted   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   Postop check 01/21/2020   Cough 01/08/2020   Shortness of breath 01/08/2020   Empyema of pleural space (HCC) 12/18/2019   Chest tube in place    ARDS (adult respiratory distress syndrome) (HCC)    Empyema lung (HCC)    Hydropneumothorax    MSSA bacteremia 11/25/2019   Constipation 11/17/2019   Diabetes mellitus type 2, uncontrolled, with complications (HCC) 11/12/2019   Acute pulmonary embolus (HCC) 11/12/2019   Demand ischemia (HCC) 11/12/2019   Acute respiratory failure with hypoxemia (HCC) 11/11/2019   History of COVID-19 pneumonia 11/10/2019   COVID-19 11/10/2019   Acute respiratory failure (HCC) 11/09/2019   Health care maintenance 07/29/2019   Metatarsalgia of left foot 06/18/2019   Prediabetes 05/08/2019   Peripheral neuropathy 05/08/2019   Osteoarthritis of both knees 05/08/2019   Osteoarthritis of  right knee 02/15/2017   Hand cramps 11/09/2016   Tinea pedis 06/04/2016   Low HDL (under 40) 05/09/2016   High triglycerides 05/09/2016   Medication monitoring encounter 05/03/2016   Controlled type 2 diabetes mellitus without complication, without long-term current use of insulin (HCC) 01/04/2016   Needs flu shot 01/04/2016   Morbid obesity (HCC) 01/04/2016   Screening for STD (sexually transmitted disease) 01/04/2016   Encounter for cholesteral screening for cardiovascular disease 01/04/2016   AD (atopic dermatitis) 06/09/2015   Chronic low back pain 06/09/2015   Hypertension 06/09/2015   Type 2 diabetes, controlled, with peripheral neuropathy (HCC) 12/23/2014   Consuela Mimes, PT, DPT #[id number] Gertie Exon 05/06/2021, 9:47 AM  [place] [facility] [address] [place], [place], [address] Phone: [phone]   Fax:  [phone]  Name: LONN IM MRN: [id number] Date of Birth: [birth year]

## 2021-05-05 NOTE — Patient Instructions (Addendum)
roved QoL and return to patient's PLOF.

## 2021-05-10 ENCOUNTER — Encounter: Payer: Self-pay | Admitting: Physical Therapy

## 2021-05-10 ENCOUNTER — Ambulatory Visit: Payer: Medicaid Other | Admitting: Physical Therapy

## 2021-05-10 ENCOUNTER — Other Ambulatory Visit: Payer: Self-pay

## 2021-05-10 DIAGNOSIS — R262 Difficulty in walking, not elsewhere classified: Secondary | ICD-10-CM

## 2021-05-10 DIAGNOSIS — M545 Low back pain, unspecified: Secondary | ICD-10-CM | POA: Diagnosis not present

## 2021-05-10 DIAGNOSIS — M6281 Muscle weakness (generalized): Secondary | ICD-10-CM

## 2021-05-10 NOTE — Therapy (Signed)
[facility] [address] [place], [place], [address] Phone: [phone]   Fax:  [phone]  Physical Therapy Treatment  Patient Details  Name: Jenelle MagesJohnny R Pries MRN: [id number] Date of Birth: 10/28/1966 Referring Provider (PT): Miki KinsAmanda M Shirley, FNP   Encounter Date: 05/10/2021   PT End of Session - 05/10/21 1133     Visit Number 7    Number of Visits 13    Date for PT Re-Evaluation 06/09/21    Authorization Type [employer] Medicaid VL 27 combined PT/OT/SLP per calendar year    Authorization Time Period Eval 04/14/2021    Authorization - Visit Number 7    Authorization - Number of Visits 10    Progress Note Due on Visit 10    PT Start Time 1120    PT Stop Time 1205    PT Time Calculation (min) 45 min    Activity Tolerance Patient tolerated treatment well;No increased pain;Patient limited by fatigue   Patient has SOB secondary to hold lung Sx and subsequent COVID-19 infection   Behavior [facility] tasks assessed/performed             Past Medical History:  Diagnosis Date   Diabetes mellitus without complication (HCC)    Heel pain 06/09/2015   High triglycerides 05/09/2016   Hypertension    Low HDL (under 40) 05/09/2016   Obesity, Class II, BMI 35-39.9, with comorbidity (HCC) 01/04/2016    Past Surgical History:  Procedure Laterality Date   VIDEO ASSISTED THORACOSCOPY (VATS)/EMPYEMA Right 12/18/2019   Procedure: VIDEO ASSISTED THORACOSCOPY (VATS)/EMPYEMA;  Surgeon: Kerin PernaVan Trigt, Peter, MD;  [facility];  Service: Thoracic;  Laterality: Right;    There were no vitals filed for this visit.   Subjective Assessment - 05/10/21 1121     Subjective Patient reports feeling "pretty good" at arrival to PT. Patient reports similar pain level with standing and walking activity. He reports using his rollator to walk in park and he had to sit due to pain with walking after period of time. Patient reports compliance with his HEP. He  reports similar duration of standing tolerance. He reports pain up to 10/10 with standing/upright activity. F/u with referring provider 05/17/21.    Pertinent History Patient is a 55 year old male with primary complaint of low back pain with Hx of R lung surgery 2 years ago (unspecified) with some shortness of breath following this. SOB was also exacerbated with previous COVID-19 infection. Pt reports onset of low back pain about 2 months ago. Pt reports that when he starts walking, he has pain in lumbosacral region. Pt reports Hx of peripheral neuropathy for about 5 years. Pt denies new numbness or paresthesias. Patient reports difficulty getting to sleep; pt denies pain waking up from his sleep. Pt reports no change in bowel/bladder with onset of back pain. No recent imaging on low back. Patient reports no pain at rest. He reports 10/10 pain with standing and walking. Time of day does not affect pain. Pt does use rolling walker for prolonged walking; he will use grocery cart at grocery. 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    Limitations Standing;Walking    How long can you stand comfortably? 10 minutes    How long can you walk comfortably? 10 minutes    Patient Stated Goals Able to complete more physical activity    Currently in Pain? No/denies    Pain Onset More than a month  ago              Lumbar spine AROM Flexion WFL Extension WFL Lateral flexion: R WFL *, L WFL Rotation: R WFL, WFL   Repeated flexion in lying: during: no effect, after: no effect  Repeated extension in standing: during: produce, after: increased (pain in low back and increased pain further with walking trial)      Treatment Performed    Neuromuscular Re-education - for lumbopelvic control, trunk stabilization Posterior pelvic tilt; seated on blue physioball; 2x10 [heavy verbal and tactile cues, demonstration] Posterior pelvic tilt with Bridge;  x20 Standing minisquat; 2x10 [verbal cueing and demo for neutral spine and upright posture]     *not today* Nautilus core walkout, belt around waist; posterior only today, length of agility ladder; x5 D/B Open book; x10 each side   Therapeutic exercise - for thoracolumbar mobility, paraspinal and posterior LE chain soft tissue mobility, ROM Double knee to chest; 1x10 Ambulating trial, lap around clinic; x3 Repeated extension in standing; x10 Ambulating trial, lap around clinic; x3 Double knee to chest; 1x10     *not today* Prone knee bend, x20 bilateral Seated swiss ball rollout, Blue Swiss ball, 3-way (forward, diagonal L and R); x5, 5 sec each dir Lower trunk rotation, with QL bias (LE in figure-4 position); x10 ea side Single knee to chest; x10 each LE, 1 sec hold        ASSESSMENT Patient has ongoing pain with standing/walking and feels that volume of standing/walking activity tolerated has not changed to date. He demonstrates improved thoracolumbar AROM with only mild pain reproduced with R lateral flexion. Further explored repeated movements due to manual therapy and dry needling strategies being exhausted as well as patient continuing work on specific soft tissue mobility deficits measured at IE and earlier follow-ups. Patient has increase in symptoms with increased pain c walking following repeated extension. Patient has no pain in lying following repeated flexion in lying, though symptoms during walking are moderately increased with 3 laps walking around gym. Patient has made modest progress to date and will benefit from continued skilled therapeutic intervention to address remaining pain, ROM deficits, and standing/walking actvity limitations as needed for improved QoL. Patient may need f/u with referring provider if significant progress is not attained with conservative treatment.     PT Education - 05/10/21 1848     Education Details Discussed with patient benefit of  light-to-moderate aerobic work with use of low-impact mode of exercise e.g. stationary pedals; available resources for at-home stationary bike pedals discussed with pt and spouse. Discussed potential benefit for improving endurance and strength for ability to complete longer-duration walking activity.    Person(s) Educated Patient    Methods Explanation;Demonstration    Comprehension Verbalized understanding               PT Short Term Goals - 05/04/21 1333       PT SHORT TERM GOAL #1   Title Patient will be indepedent and 100% compliant with established HEP and activity modification as needed to augment PT intervention and prevent flare-up of patient condition.    Baseline 05/04/21: Full compliance with home program    Time 2    Period Weeks    Status Achieved    Target Date 04/28/21      PT SHORT TERM GOAL #2   Title Patient will have full thoracolumbar AROM without reproduction of pain as needed for functional reaching, overhead activity, self-care ADLs    Baseline Difficulty  with return to neutral with lumbar flexion. Pain with L-sided sidebend and rotation    Time 4    Period Weeks    Status On-going    Target Date 05/12/21      PT SHORT TERM GOAL #3   Title Patient will tolerate standing up to 30 minutes without reproduction of pain as needed for completing household chores, community-level mobility, and social/community outings    Baseline Difficulty standing > 5 mins    Time 4    Period Weeks    Status New    Target Date 05/12/21               PT Long Term Goals - 04/14/21 1418       PT LONG TERM GOAL #1   Title Patient will demonstrate improved function as evidenced by a score of 56 on FOTO measure for full participation in activities at home and in the community.    Baseline FOTO 39    Time 8    Period Weeks    Status New    Target Date 06/09/21      PT LONG TERM GOAL #2   Title Patient will tolerate walking up to 1 hour without reproduction of pain  in back as needed for community outings, grocery shopping, household and outdoor chores    Baseline Difficulty walking > 10 minutes    Time 8    Period Weeks    Status New    Target Date 06/09/21      PT LONG TERM GOAL #3   Title Patient will perform up to 30 minutes of standing/walking activity in clinic without reproduction of back pain necessitating sitting break indicative of improved standing tolerance and ability to complete community-level mobility tasks, social outings, grocery shopping    Baseline Difficulty standing > 5 mins, walking > 10 mins    Time 8    Period Weeks    Status New    Target Date 06/09/21                   Plan - 05/10/21 1847     Clinical Impression Statement ASSESSMENT  Patient has ongoing pain with standing/walking and feels that volume of standing/walking activity tolerated has not changed to date. He demonstrates improved thoracolumbar AROM with only mild pain reproduced with R lateral flexion. Further explored repeated movements due to manual therapy and dry needling strategies being exhausted as well as patient continuing work on specific soft tissue mobility deficits measured at IE and earlier follow-ups. Patient has increase in symptoms with increased pain c walking following repeated extension. Patient has no pain in lying following repeated flexion in lying, though symptoms during walking are moderately increased with 3 laps walking around gym. Patient has made modest progress to date and will benefit from continued skilled therapeutic intervention to address remaining pain, ROM deficits, and standing/walking actvity limitations as needed for improved QoL. Patient may need f/u with referring provider if significant progress is not attained with conservative treatment.    Personal Factors and Comorbidities Comorbidity 3+;Fitness   Hx of thoracoscopy for empyema, R side   Comorbidities obesity, Type 2 DM, Hx of COVID-19 infection and persistent SOB,  HTN, Hx of thoracoscopy for R empyema    Examination-Activity Limitations Stand;Locomotion Level;Sleep    Examination-Participation Restrictions Cleaning;Church;Community Activity;Shop    Stability/Clinical Decision Making Evolving/Moderate complexity    Rehab Potential Good    PT Frequency 2x / week    PT Duration  8 weeks    PT Treatment/Interventions Electrical Stimulation;Therapeutic activities;Therapeutic exercise;Neuromuscular re-education;Manual techniques;Dry needling;Patient/family education    PT Next Visit Plan Repeated flexion/flexion-bias program. Increase emphasis on improving aerobic and muscular endurance, strengthening and standing activities as able.    PT Home Exercise Plan Access Code [email or web address]    Consulted and Agree with Plan of Care Patient             Patient will benefit from skilled therapeutic intervention in order to improve the following deficits and impairments:  Decreased endurance, Decreased mobility, Difficulty walking, Hypomobility, Pain, Impaired flexibility, Decreased strength, Decreased activity tolerance, Decreased range of motion  Visit Diagnosis: Acute right-sided low back pain without sciatica  Difficulty in walking, not elsewhere classified  Muscle weakness (generalized)     Problem List Patient Active Problem List   Diagnosis Date Noted   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   Postop check 01/21/2020   Cough 01/08/2020   Shortness of breath 01/08/2020   Empyema of pleural space (HCC) 12/18/2019   Chest tube in place    ARDS (adult respiratory distress syndrome) (HCC)    Empyema lung (HCC)    Hydropneumothorax    MSSA bacteremia 11/25/2019   Constipation 11/17/2019   Diabetes mellitus type 2, uncontrolled, with complications (HCC) 11/12/2019   Acute pulmonary embolus (HCC) 11/12/2019   Demand ischemia (HCC) 11/12/2019   Acute respiratory failure with hypoxemia (HCC) 11/11/2019   History of COVID-19 pneumonia 11/10/2019   COVID-19 11/10/2019    Acute respiratory failure (HCC) 11/09/2019   Health care maintenance 07/29/2019   Metatarsalgia of left foot 06/18/2019   Prediabetes 05/08/2019   Peripheral neuropathy 05/08/2019   Osteoarthritis of both knees 05/08/2019   Osteoarthritis of right knee 02/15/2017   Hand cramps 11/09/2016   Tinea pedis 06/04/2016   Low HDL (under 40) 05/09/2016   High triglycerides 05/09/2016   Medication monitoring encounter 05/03/2016   Controlled type 2 diabetes mellitus without complication, without long-term current use of insulin (HCC) 01/04/2016   Needs flu shot 01/04/2016   Morbid obesity (HCC) 01/04/2016   Screening for STD (sexually transmitted disease) 01/04/2016   Encounter for cholesteral screening for cardiovascular disease 01/04/2016   AD (atopic dermatitis) 06/09/2015   Chronic low back pain 06/09/2015   Hypertension 06/09/2015   Type 2 diabetes, controlled, with peripheral neuropathy (HCC) 12/23/2014   Consuela Mimes, PT, DPT #[id number] Gertie Exon 05/10/2021, 6:53 PM  [facility] [address] [place], [place], [address] Phone: [phone]   Fax:  [phone]  Name: SHERWOOD CASTILLA MRN: [id number] Date of Birth: 1966/06/23

## 2021-05-12 ENCOUNTER — Encounter: Payer: Self-pay | Admitting: Physical Therapy

## 2021-05-12 ENCOUNTER — Ambulatory Visit: Payer: Medicaid Other | Admitting: Physical Therapy

## 2021-05-12 ENCOUNTER — Other Ambulatory Visit: Payer: Self-pay

## 2021-05-12 DIAGNOSIS — M545 Low back pain, unspecified: Secondary | ICD-10-CM

## 2021-05-12 DIAGNOSIS — R262 Difficulty in walking, not elsewhere classified: Secondary | ICD-10-CM

## 2021-05-12 DIAGNOSIS — M6281 Muscle weakness (generalized): Secondary | ICD-10-CM

## 2021-05-12 NOTE — Therapy (Signed)
[facility] [address] [place], [place], [address] Phone: [phone]   Fax:  [phone]  Physical Therapy Treatment  Patient Details  Name: Nathaniel Webb MRN: [id number] Date of Birth: 1966-08-31 Referring Provider (PT): Miki Kins, FNP   Encounter Date: 05/12/2021   PT End of Session - 05/12/21 1113     Visit Number 8    Number of Visits 13    Date for PT Re-Evaluation 06/09/21    Authorization Type [employer] Medicaid VL 27 combined PT/OT/SLP per calendar year    Authorization Time Period Eval 04/14/2021    Authorization - Visit Number 8    Authorization - Number of Visits 10    Progress Note Due on Visit 10    PT Start Time 1116    PT Stop Time 1201    PT Time Calculation (min) 45 min    Activity Tolerance Patient tolerated treatment well;No increased pain;Patient limited by fatigue   Patient has SOB secondary to hold lung Sx and subsequent COVID-19 infection   Behavior During Therapy [facility] for tasks assessed/performed             Past Medical History:  Diagnosis Date   Diabetes mellitus without complication (HCC)    Heel pain 06/09/2015   High triglycerides 05/09/2016   Hypertension    Low HDL (under 40) 05/09/2016   Obesity, Class II, BMI 35-39.9, with comorbidity (HCC) 01/04/2016    Past Surgical History:  Procedure Laterality Date   VIDEO ASSISTED THORACOSCOPY (VATS)/EMPYEMA Right 12/18/2019   Procedure: VIDEO ASSISTED THORACOSCOPY (VATS)/EMPYEMA;  Surgeon: Kerin Perna, MD;  Location: [facility] OR;  Service: Thoracic;  Laterality: Right;    There were no vitals filed for this visit.   Subjective Assessment - 05/12/21 1121     Subjective Patient reports feeling "okay" at arrival to PT. He reports he hasn't walked much today to irritate his back. Patient reports compliance with his home exercises. Patient reports he was transiently irritated following standing activity last visit. He states he had to sit for a  while. He reports compliance with updated HEP. Patient reports  F/u with referring provider 05/17/21.    Pertinent History Patient is a 54 year old male with primary complaint of low back pain with Hx of R lung surgery 2 years ago (unspecified) with some shortness of breath following this. SOB was also exacerbated with previous COVID-19 infection. Pt reports onset of low back pain about 2 months ago. Pt reports that when he starts walking, he has pain in lumbosacral region. Pt reports Hx of peripheral neuropathy for about 5 years. Pt denies new numbness or paresthesias. Patient reports difficulty getting to sleep; pt denies pain waking up from his sleep. Pt reports no change in bowel/bladder with onset of back pain. No recent imaging on low back. Patient reports no pain at rest. He reports 10/10 pain with standing and walking. Time of day does not affect pain. Pt does use rolling walker for prolonged walking; he will use grocery cart at grocery. 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    Limitations Standing;Walking    How long can you stand comfortably? 10 minutes    How long can you walk comfortably? 10 minutes    Patient Stated Goals Able to complete more physical activity    Pain Onset More than a month ago               OBJECTIVE  FINDINGS  Repeated flexion in lying: during: no effect, after: no pain in lying.   Pain is reproduced after ambulating lap in gym x 4        Treatment Performed    Neuromuscular Re-education - for lumbopelvic control, trunk stabilization  Posterior pelvic tilt with Bridge; x20 Standing minisquat; 2x10  with Goblet hold, 8-lb Dumbbell [verbal cueing and demo for posture and technique] Dying bug from hooklying position; x10 alternating L/R [demonstration and verbal cueing for alternating technique] Pallof press with Nautilus; 30 lbs; x15 each direction     *not today* Posterior pelvic tilt; seated on  blue physioball; 2x10 [heavy verbal and tactile cues, demonstration] Nautilus core walkout, belt around waist; posterior only today, length of agility ladder; x5 D/B Open book; x10 each side   Therapeutic exercise - for thoracolumbar mobility, paraspinal and posterior LE chain soft tissue mobility, ROM Double knee to chest; 1x10 Ambulating trial, lap around clinic; x4  Patient education: Review of resources for completing at-home low-impact aerobic activity (bike pedals to be used on sitting), ongoing goals of PT, and f/u with referring provider to discuss other medical management options prn     *not today* Repeated extension in standing; x10 Prone knee bend, x20 bilateral Seated swiss ball rollout, Blue Swiss ball, 3-way (forward, diagonal L and R); x5, 5 sec each dir Lower trunk rotation, with QL bias (LE in figure-4 position); x10 ea side Single knee to chest; x10 each LE, 1 sec hold        ASSESSMENT Patient tolerates modestly larger volume of walking in gym prior to onset of pain, though this may also be attributed to minimal walking/standing activity this AM prior to physical therapy. Patient has clear aversion to extension postures and is continuing with flexion-bias program. He has been able to progress volume of gluteal and paraspinal progressive loading drills with good tolerance (initial trial of Nautilus walkout at previous visit increased low back pain). He does still have significant limitation in standing/walking duration. Patient has made modest progress to date and will benefit from continued skilled therapeutic intervention to address remaining pain, ROM deficits, and standing/walking actvity limitations as needed for improved QoL. Patient may need f/u with referring provider if significant progress is not attained with conservative treatment.      PT Short Term Goals - 05/04/21 1333       PT SHORT TERM GOAL #1   Title Patient will be indepedent and 100% compliant with  established HEP and activity modification as needed to augment PT intervention and prevent flare-up of patient condition.    Baseline 05/04/21: Full compliance with home program    Time 2    Period Weeks    Status Achieved    Target Date 04/28/21      PT SHORT TERM GOAL #2   Title Patient will have full thoracolumbar AROM without reproduction of pain as needed for functional reaching, overhead activity, self-care ADLs    Baseline Difficulty with return to neutral with lumbar flexion. Pain with L-sided sidebend and rotation    Time 4    Period Weeks    Status On-going    Target Date 05/12/21      PT SHORT TERM GOAL #3   Title Patient will tolerate standing up to 30 minutes without reproduction of pain as needed for completing household chores, community-level mobility, and social/community outings    Baseline Difficulty standing > 5 mins    Time 4    Period Weeks  Status New    Target Date 05/12/21               PT Long Term Goals - 04/14/21 1418       PT LONG TERM GOAL #1   Title Patient will demonstrate improved function as evidenced by a score of 56 on FOTO measure for full participation in activities at home and in the community.    Baseline FOTO 39    Time 8    Period Weeks    Status New    Target Date 06/09/21      PT LONG TERM GOAL #2   Title Patient will tolerate walking up to 1 hour without reproduction of pain in back as needed for community outings, grocery shopping, household and outdoor chores    Baseline Difficulty walking > 10 minutes    Time 8    Period Weeks    Status New    Target Date 06/09/21      PT LONG TERM GOAL #3   Title Patient will perform up to 30 minutes of standing/walking activity in clinic without reproduction of back pain necessitating sitting break indicative of improved standing tolerance and ability to complete community-level mobility tasks, social outings, grocery shopping    Baseline Difficulty standing > 5 mins, walking > 10  mins    Time 8    Period Weeks    Status New    Target Date 06/09/21                   Plan - 05/12/21 1359     Clinical Impression Statement Patient tolerates modestly larger volume of walking in gym prior to onset of pain, though this may also be attributed to minimal walking/standing activity this AM prior to physical therapy. Patient has clear aversion to extension postures and is continuing with flexion-bias program. He has been able to progress volume of gluteal and paraspinal progressive loading drills with good tolerance (initial trial of Nautilus walkout increased low back pain). He does still have significant limitation in standing/walking duration. Patient has made modest progress to date and will benefit from continued skilled therapeutic intervention to address remaining pain, ROM deficits, and standing/walking actvity limitations as needed for improved QoL. Patient may need f/u with referring provider if significant progress is not attained with conservative treatment.    Personal Factors and Comorbidities Comorbidity 3+;Fitness   Hx of thoracoscopy for empyema, R side   Comorbidities obesity, Type 2 DM, Hx of COVID-19 infection and persistent SOB, HTN, Hx of thoracoscopy for R empyema    Examination-Activity Limitations Stand;Locomotion Level;Sleep    Examination-Participation Restrictions Cleaning;Church;Community Activity;Shop    Stability/Clinical Decision Making Evolving/Moderate complexity    Rehab Potential Good    PT Frequency 2x / week    PT Duration 8 weeks    PT Treatment/Interventions Electrical Stimulation;Therapeutic activities;Therapeutic exercise;Neuromuscular re-education;Manual techniques;Dry needling;Patient/family education    PT Next Visit Plan Repeated flexion/flexion-bias program. Increase emphasis on improving aerobic and muscular endurance, strengthening and standing activities as able.    PT Home Exercise Plan Access Code [email or web address]    Consulted  and Agree with Plan of Care Patient             Patient will benefit from skilled therapeutic intervention in order to improve the following deficits and impairments:  Decreased endurance, Decreased mobility, Difficulty walking, Hypomobility, Pain, Impaired flexibility, Decreased strength, Decreased activity tolerance, Decreased range of motion  Visit Diagnosis: Acute right-sided low back pain without sciatica  Difficulty in walking, not elsewhere classified  Muscle weakness (generalized)     Problem List Patient Active Problem List   Diagnosis Date Noted   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   Postop check 01/21/2020   Cough 01/08/2020   Shortness of breath 01/08/2020   Empyema of pleural space (HCC) 12/18/2019   Chest tube in place    ARDS (adult respiratory distress syndrome) (HCC)    Empyema lung (HCC)    Hydropneumothorax    MSSA bacteremia 11/25/2019   Constipation 11/17/2019   Diabetes mellitus type 2, uncontrolled, with complications (HCC) 11/12/2019   Acute pulmonary embolus (HCC) 11/12/2019   Demand ischemia (HCC) 11/12/2019   Acute respiratory failure with hypoxemia (HCC) 11/11/2019   History of COVID-19 pneumonia 11/10/2019   COVID-19 11/10/2019   Acute respiratory failure (HCC) 11/09/2019   Health care maintenance 07/29/2019   Metatarsalgia of left foot 06/18/2019   Prediabetes 05/08/2019   Peripheral neuropathy 05/08/2019   Osteoarthritis of both knees 05/08/2019   Osteoarthritis of right knee 02/15/2017   Hand cramps 11/09/2016   Tinea pedis 06/04/2016   Low HDL (under 40) 05/09/2016   High triglycerides 05/09/2016   Medication monitoring encounter 05/03/2016   Controlled type 2 diabetes mellitus without complication, without long-term current use of insulin (HCC) 01/04/2016   Needs flu shot 01/04/2016   Morbid obesity (HCC) 01/04/2016   Screening for STD (sexually transmitted disease) 01/04/2016   Encounter for cholesteral screening for cardiovascular  disease 01/04/2016   AD (atopic dermatitis) 06/09/2015   Chronic low back pain 06/09/2015   Hypertension 06/09/2015   Type 2 diabetes, controlled, with peripheral neuropathy (HCC) 12/23/2014   Consuela Mimes, PT, DPT #[id number] Gertie Exon 05/12/2021, 2:00 PM  [facility] [address] [place], [place], [address] Phone: [phone]   Fax:  [phone]  Name: Nathaniel Webb MRN: [id number] Date of Birth: 11/07/1966

## 2021-05-17 ENCOUNTER — Encounter: Payer: Medicaid Other | Admitting: Physical Therapy

## 2021-05-19 ENCOUNTER — Ambulatory Visit: Payer: Medicaid Other | Admitting: Physical Therapy

## 2021-05-24 ENCOUNTER — Encounter: Payer: Self-pay | Admitting: Physical Therapy

## 2021-05-24 ENCOUNTER — Other Ambulatory Visit: Payer: Self-pay

## 2021-05-24 ENCOUNTER — Ambulatory Visit: Payer: Medicaid Other | Attending: Family | Admitting: Physical Therapy

## 2021-05-24 DIAGNOSIS — M545 Low back pain, unspecified: Secondary | ICD-10-CM | POA: Insufficient documentation

## 2021-05-24 DIAGNOSIS — M6281 Muscle weakness (generalized): Secondary | ICD-10-CM | POA: Diagnosis present

## 2021-05-24 DIAGNOSIS — R262 Difficulty in walking, not elsewhere classified: Secondary | ICD-10-CM | POA: Diagnosis present

## 2021-05-24 NOTE — Therapy (Signed)
[facility] [employer] [address] [place], [place], [address] Phone: [phone]   Fax:  [phone]  Physical Therapy Treatment/Progress Note/Re-certification   Dates of reporting period  04/14/2021   to   05/24/21   Patient Details  Name: Nathaniel Webb MRN: [id number] Date of Birth: 05-13-1966 Referring Provider (PT): Mechele Claude, FNP   Encounter Date: 05/24/2021   PT End of Session - 05/25/21 1117     Visit Number 9    Number of Visits 16    Date for PT Re-Evaluation 06/09/21    Authorization Type [employer] Medicaid VL 27 combined PT/OT/SLP per calendar year    Authorization Time Period Eval 06/17/276, recert 02/22/8785    Authorization - Visit Number 9    Progress Note Due on Visit 16    PT Start Time 1120    PT Stop Time 1203    PT Time Calculation (min) 43 min    Activity Tolerance Patient tolerated treatment well;No increased pain;Patient limited by fatigue   Patient has SOB secondary to hold lung Sx and subsequent COVID-19 infection   Behavior During Therapy [facility] for tasks assessed/performed             Past Medical History:  Diagnosis Date   Diabetes mellitus without complication ([place])    Heel pain 06/09/2015   High triglycerides 05/09/2016   Hypertension    Low HDL (under 40) 05/09/2016   Obesity, Class II, BMI 35-39.9, with comorbidity ([place]) 01/04/2016    Past Surgical History:  Procedure Laterality Date   VIDEO ASSISTED THORACOSCOPY (VATS)/EMPYEMA Right 12/18/2019   Procedure: VIDEO ASSISTED THORACOSCOPY (VATS)/EMPYEMA;  Surgeon: Ivin Poot, MD;  Location: [place];  Service: Thoracic;  Laterality: Right;    There were no vitals filed for this visit.   Subjective Assessment - 05/24/21 1121     Subjective He reports feeling fairly well without notable volume of walking. He reports getting pain in his low back with walking - has to stop at 10 minutes consistently. Patient reports being limited by shortness of breath  and by low back pain. Patient has no further follow-up with his referring provider at this time. Patient  Patient had modification to his cholesterol medication recently due to difficulty controlling it - he does not know name of it. Patient feels that he is at similar level of global functional rating at this time. Patient reports ongoing difficulty with shortness of breath. Pt feels that he has not made notable progress to date. Patient reports pain up to 9/10 with walking/standing. His HEP consistency has been affected by recent passing of his son (son had COVID-54). He just initiated higher-dose Gabapentin to address LE neuropathic pain.    Pertinent History Patient is a 55 year old male with primary complaint of low back pain with Hx of R lung surgery 2 years ago (unspecified) with some shortness of breath following this. SOB was also exacerbated with previous COVID-19 infection. Pt reports onset of low back pain about 2 months ago. Pt reports that when he starts walking, he has pain in lumbosacral region. Pt reports Hx of peripheral neuropathy for about 5 years. Pt denies new numbness or paresthesias. Patient reports difficulty getting to sleep; pt denies pain waking up from his sleep. Pt reports no change in bowel/bladder with onset of back pain. No recent imaging on low back. Patient reports no pain at rest. He reports 10/10 pain with standing and walking. Time of day does not affect pain.  Pt does use rolling walker for prolonged walking; he will use grocery cart at grocery. 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    Limitations Standing;Walking    How long can you stand comfortably? 15 minutes    How long can you walk comfortably? 10 minutes    Patient Stated Goals Able to complete more physical activity    Currently in Pain? No/denies    Pain Onset More than a month ago             OBJECTIVE FINDINGS   Posture Lumbar lordosis:  decreased Thoracic kyphosis: increased Lumbar lateral shift: negative   Gait No remarkable findings   Palpation Tenderness to palpation along bilateral iliocostalis and longissimus lumborum L1-L5, spinous processes L3-S1 Mild hypersensitivity of R thoracic incision     Strength (out of 5) R/L 5/5 Hip flexion 5/5 Hip ER 5/5 Hip IR 5/5 Hip adduction 5/5 Knee extension 5/5 Knee flexion 5/5 Ankle dorsiflexion 5/5 Ankle plantarflexion   *Indicates pain   AROM (degrees) R/L (all movements include overpressure unless otherwise stated) Lumbar forward flexion (65): 100% reach to toes,  (no pain with return to neutral, small "pull" at end-range) Lumbar extension (30): 100% no pain  Lumbar lateral flexion (25): R: 100%  L: 100%  Thoracic and Lumbar rotation (30 degrees):  R: 100% L: 100%   Hip IR (0-45): R:30 deg L: 30 deg Hip ER (0-45): R: 40 deg L: 40 deg Hip Flexion (0-125): R: 100 deg, L 100 deg Hip Abduction (0-40): R: WNL L: WNL *Indicates pain     Repeated Movements No centralization or peripheralization of symptoms with repeated lumbar extension or flexion.   Muscle Length Hamstrings: R: 17 degrees L: 15 degrees   Passive Accessory Intervertebral Motion (PAIVM) Reproduction of back pain with CPA L1-L5. Generally hypomobile throughout.         OBJECTIVE FINDINGS   Repeated flexion in lying: during: no effect, after: no pain in lying.   Pain is reproduced after ambulating lap in gym x 5       Treatment Performed    Ambulate lap in gym x 5 prior to onset of back pain   Therapeutic exercise - for thoracolumbar mobility, paraspinal and posterior LE chain soft tissue mobility, ROM Double knee to chest; 1x10 Ambulating trial, lap around clinic; x5   Patient education: Review of resources for completing at-home low-impact aerobic activity (bike pedals to be used on sitting), ongoing goals of PT, and recommended f/u with referring provider to discuss other  medical management options      *not today* Repeated extension in standing; x10 Prone knee bend, x20 bilateral Seated swiss ball rollout, Blue Swiss ball, 3-way (forward, diagonal L and R); x5, 5 sec each dir Lower trunk rotation, with QL bias (LE in figure-4 position); x10 ea side Single knee to chest; x10 each LE, 1 sec hold   Manual Therapy - for symptom modulation, soft tissue sensitivity and mobility, lumbar spine accessory mobility and low-grade mobilization for pain control (In prone) STM/DTM Bilateral erector spinae L1-L5 CPA gr I-II L1-5         ASSESSMENT Patient tolerates modestly increased volume of walking activity prior to onset of back pain today. He exhibits improved posterior LE chain soft tissue extensibility and hip complex mobility. He has thoracolumbar AROM within functional limits without reproduction of symptoms. He also has excellent myotomal strength compared to IE with no reproduction of symptoms during manual muscle  testing. He has no pain at rest and in sitting; however, he has consistent pain following walking and prolonged upright activity. Duration of standing has improved modestly compared to self-report at IE, but patient feels that limitation with standing/walking has not progressed remarkably. Patient is also limited with comorbid shortness of breath due to history of thoracoscopy for R empyema and post-COVID issues. Discussed at length with the patient options moving forward given his limited subjective appraisal of progress. Current plan is to follow up with referring provider and continue PT until patient is able to discuss medical management with Georgian Co, FNP. Patient has made modest progress to date and will benefit from continued skilled therapeutic intervention to address remaining pain, ROM deficits, and standing/walking actvity limitations as needed for improved QoL.     PT Short Term Goals - 05/24/21 1238       PT SHORT TERM GOAL #1    Title Patient will be indepedent and 100% compliant with established HEP and activity modification as needed to augment PT intervention and prevent flare-up of patient condition.    Baseline 05/04/21: Full compliance with home program    Time 2    Period Weeks    Status Achieved    Target Date 04/28/21      PT SHORT TERM GOAL #2   Title Patient will have full thoracolumbar AROM without reproduction of pain as needed for functional reaching, overhead activity, self-care ADLs    Baseline Difficulty with return to neutral with lumbar flexion. Pain with L-sided sidebend and rotation. 05/24/21: thoracolumbar AROM WFL without reproduction of pain    Time 4    Period Weeks    Status Achieved    Target Date 05/12/21      PT SHORT TERM GOAL #3   Title Patient will tolerate standing up to 30 minutes without reproduction of pain as needed for completing household chores, community-level mobility, and social/community outings    Baseline Difficulty standing > 5 mins. 05/24/21: tolerates standing up to 15 minutes    Time 4    Period Weeks    Status Partially Met    Target Date 05/12/21               PT Long Term Goals - 05/24/21 1242       PT LONG TERM GOAL #1   Title Patient will demonstrate improved function as evidenced by a score of 56 on FOTO measure for full participation in activities at home and in the community.    Baseline IE FOTO 39, 05/24/21: FOTO 40    Time 8    Period Weeks    Status Not Met    Target Date 06/09/21      PT LONG TERM GOAL #2   Title Patient will tolerate walking up to 1 hour without reproduction of pain in back as needed for community outings, grocery shopping, household and outdoor chores    Baseline Difficulty walking > 10 minutes; 05/24/21: difficulty walking > 10 minutes    Time 8    Period Weeks    Status Not Met      PT LONG TERM GOAL #3   Title Patient will perform up to 30 minutes of standing/walking activity in clinic without reproduction of back  pain necessitating sitting break indicative of improved standing tolerance and ability to complete community-level mobility tasks, social outings, grocery shopping    Baseline Difficulty standing > 5 mins, walking > 10 mins; 05/24/21: up to 15 minutes of standing  activity in clinic    Time 8    Period Weeks    Status Partially Met    Target Date 06/09/21                   Plan - 05/25/21 1134     Clinical Impression Statement Patient tolerates modestly increased volume of walking activity prior to onset of back pain today. He exhibits improved posterior LE chain soft tissue extensibility and hip complex mobility. He has thoracolumbar AROM within functional limits without reproduction of symptoms. He also has excellent myotomal strength compared to IE with no reproduction of symptoms during manual muscle testing. He has no pain at rest and in sitting; however, he has consistent pain following walking and prolonged upright activity. Duration of walking/standing has improved compared to self-repot at IE, but patient feels that limitation with standing/walking has not progressed remarkably. Patient is also limited with comorbid shortness of breath due to history of thoracoscopy for R empyema and post-COVID issues. Discussed at length with the patient options moving forward given his limited subjective appraisal of progress. Current plan is to follow up with referring provider and continue PT until patient is able to discuss medical management with Georgian Co, FNP. Patient has made modest progress to date and will benefit from continued skilled therapeutic intervention to address remaining pain, ROM deficits, and standing/walking actvity limitations as needed for improved QoL.    Personal Factors and Comorbidities Comorbidity 3+;Fitness   Hx of thoracoscopy for empyema, R side   Comorbidities obesity, Type 2 DM, Hx of COVID-19 infection and persistent SOB, HTN, Hx of thoracoscopy for R empyema     Examination-Activity Limitations Stand;Locomotion Level;Sleep    Examination-Participation Restrictions Cleaning;Church;Community Activity;Shop    Stability/Clinical Decision Making Evolving/Moderate complexity    Rehab Potential Good    PT Frequency 2x / week    PT Duration 8 weeks    PT Treatment/Interventions Electrical Stimulation;Therapeutic activities;Therapeutic exercise;Neuromuscular re-education;Manual techniques;Dry needling;Patient/family education    PT Next Visit Plan Repeated flexion/flexion-bias program. Increase emphasis on improving aerobic and muscular endurance, strengthening and standing activities as able.    PT Home Exercise Plan Access Code [email or web address]    IYMEBRAXE and Agree with Plan of Care Patient             Patient will benefit from skilled therapeutic intervention in order to improve the following deficits and impairments:  Decreased endurance, Decreased mobility, Difficulty walking, Hypomobility, Pain, Impaired flexibility, Decreased strength, Decreased activity tolerance, Decreased range of motion  Visit Diagnosis: Acute right-sided low back pain without sciatica  Difficulty in walking, not elsewhere classified  Muscle weakness (generalized)     Problem List Patient Active Problem List   Diagnosis Date Noted   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   Postop check 01/21/2020   Cough 01/08/2020   Shortness of breath 01/08/2020   Empyema of pleural space ([place]) 12/18/2019   Chest tube in place    ARDS (adult respiratory distress syndrome) ([place])    Empyema lung ([facility])    Hydropneumothorax    MSSA bacteremia 11/25/2019   Constipation 11/17/2019   Diabetes mellitus type 2, uncontrolled, with complications ([place]) 94/05/6807   Acute pulmonary embolus ([place]) 11/12/2019   Demand ischemia ([place]) 11/12/2019   Acute respiratory failure with hypoxemia ([place]) 11/11/2019   History of COVID-19 pneumonia 11/10/2019   COVID-19 11/10/2019   Acute respiratory failure ([place])  11/09/2019   Health care maintenance 07/29/2019   Metatarsalgia of left foot 06/18/2019   Prediabetes 05/08/2019  Peripheral neuropathy 05/08/2019   Osteoarthritis of both knees 05/08/2019   Osteoarthritis of right knee 02/15/2017   Hand cramps 11/09/2016   Tinea pedis 06/04/2016   Low HDL (under 40) 05/09/2016   High triglycerides 05/09/2016   Medication monitoring encounter 05/03/2016   Controlled type 2 diabetes mellitus without complication, without long-term current use of insulin ([place]) 01/04/2016   Needs flu shot 01/04/2016   Morbid obesity ([place]) 01/04/2016   Screening for STD (sexually transmitted disease) 01/04/2016   Encounter for cholesteral screening for cardiovascular disease 01/04/2016   AD (atopic dermatitis) 06/09/2015   Chronic low back pain 06/09/2015   Hypertension 06/09/2015   Type 2 diabetes, controlled, with peripheral neuropathy ([place]) 12/23/2014    Valentina Gu, PT, DPT #[id number] Eilleen Kempf 05/25/2021, 1:11 PM  [facility] [address] [place], [place], [address] Phone: [phone]   Fax:  [phone]  Name: URIYAH RASKA MRN: [id number] Date of Birth: 1965-12-22

## 2021-05-26 ENCOUNTER — Ambulatory Visit: Payer: Medicaid Other | Admitting: Physical Therapy

## 2021-05-26 ENCOUNTER — Other Ambulatory Visit: Payer: Self-pay

## 2021-05-26 ENCOUNTER — Encounter: Payer: Self-pay | Admitting: Physical Therapy

## 2021-05-26 DIAGNOSIS — R262 Difficulty in walking, not elsewhere classified: Secondary | ICD-10-CM

## 2021-05-26 DIAGNOSIS — M6281 Muscle weakness (generalized): Secondary | ICD-10-CM

## 2021-05-26 DIAGNOSIS — M545 Low back pain, unspecified: Secondary | ICD-10-CM

## 2021-05-26 NOTE — Therapy (Signed)
[facility] [address]. [place], [place], [address] Phone: [phone]   Fax:  [phone]  Physical Therapy Treatment  Patient Details  Name: Nathaniel Webb MRN: [id number] Date of Birth: 05-Jul-1966 Referring Provider (PT): Mechele Claude, FNP   Encounter Date: 05/26/2021   PT End of Session - 05/26/21 1130     Visit Number 10    Number of Visits 16    Date for PT Re-Evaluation 06/09/21    Authorization Type [employer] Medicaid VL 27 combined PT/OT/SLP per calendar year    Authorization Time Period Eval 06/18/7618, recert 03/21/3266    Authorization - Visit Number 10    Progress Note Due on Visit 16    PT Start Time 1120    PT Stop Time 1208    PT Time Calculation (min) 48 min    Activity Tolerance Patient tolerated treatment well;No increased pain;Patient limited by fatigue   Patient has SOB secondary to hold lung Sx and subsequent COVID-19 infection   Behavior During Therapy [facility] for tasks assessed/performed             Past Medical History:  Diagnosis Date   Diabetes mellitus without complication ([place])    Heel pain 06/09/2015   High triglycerides 05/09/2016   Hypertension    Low HDL (under 40) 05/09/2016   Obesity, Class II, BMI 35-39.9, with comorbidity ([place]) 01/04/2016    Past Surgical History:  Procedure Laterality Date   VIDEO ASSISTED THORACOSCOPY (VATS)/EMPYEMA Right 12/18/2019   Procedure: VIDEO ASSISTED THORACOSCOPY (VATS)/EMPYEMA;  Surgeon: Ivin Poot, MD;  Location: [place];  Service: Thoracic;  Laterality: Right;    There were no vitals filed for this visit.   Subjective Assessment - 05/26/21 1122     Subjective Patient has not gotten answer at referring clinic yet; pt left voicemail. Patient reports compliance with his HEP. Patient reports feeling "okay" at arrival to PT. Patient has stressors related to payment for his son's funeral. Patient reports he hasn't completed much standing activity, so his back  hasn't bothered him yet. He reports having more pain yesterday with standing up to the 10-15 minute mark. He has additional cholesterol medication, Farxiga 10 mg, added to his medication regimen.    Pertinent History Patient is a 55 year old male with primary complaint of low back pain with Hx of R lung surgery 2 years ago (unspecified) with some shortness of breath following this. SOB was also exacerbated with previous COVID-19 infection. Pt reports onset of low back pain about 2 months ago. Pt reports that when he starts walking, he has pain in lumbosacral region. Pt reports Hx of peripheral neuropathy for about 5 years. Pt denies new numbness or paresthesias. Patient reports difficulty getting to sleep; pt denies pain waking up from his sleep. Pt reports no change in bowel/bladder with onset of back pain. No recent imaging on low back. Patient reports no pain at rest. He reports 10/10 pain with standing and walking. Time of day does not affect pain. Pt does use rolling walker for prolonged walking; he will use grocery cart at grocery. 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    Limitations Standing;Walking    How long can you stand comfortably? 15 minutes    How long can you walk comfortably? 10 minutes    Patient Stated Goals Able to complete more physical activity    Currently in Pain? No/denies    Pain Onset More than  a month ago            MMT Hip extension: R 4+/5, L 5/5    Treatment Performed  Therapeutic exercise - for thoracolumbar mobility, paraspinal and posterior LE chain soft tissue mobility, ROM  Sit to stand from table; 2x10, with 6-lb Med ball  Lower trunk rotation, with QL bias (LE in figure-4 position); x10 ea side   Patient education: HEP update and review; discussed recommendation for low-impact aerobic activity to perform during the week     *not today* Double knee to chest; 1x10 Repeated extension in standing;  x10 Prone knee bend, x20 bilateral Seated swiss ball rollout, Blue Swiss ball, 3-way (forward, diagonal L and R); x5, 5 sec each dir  Single knee to chest; x10 each LE, 1 sec hold       Neuromuscular Re-education - for lumbopelvic control, trunk stabilization   Posterior pelvic tilt with Bridge; x20 Standing minisquat; 2x10  with Goblet hold, 8-lb Dumbbell [verbal cueing and demo for posture and technique] Dying bug from hooklying position; x15 alternating L/R [demonstration and verbal cueing for alternating technique] Pallof press with Nautilus; 30 lbs; x15 each direction     *not today* Posterior pelvic tilt; seated on blue physioball; 2x10 [heavy verbal and tactile cues, demonstration] Nautilus core walkout, belt around waist; posterior only today, length of agility ladder; x5 D/B Open book; x10 each side     ASSESSMENT Patient has fair tolerance of additional graded loading performed in clinic today. He has good gluteus maximus/hip extensor strength per manual muscle testing today with no onset of pain during testing. Patient has similar limitation in volume of standing/upright activity. Will progress graded loading and graded activity as tolerated with pt continuing HEP and aerobic conditioning work outside of clinic. Current plan is to follow up with referring provider and continue PT until patient is able to discuss medical management with Georgian Co, FNP. Patient has made modest progress to date and will benefit from continued skilled therapeutic intervention to address remaining pain, positional tolerance deficits, strength, and standing/walking activity limitations as needed for improved QoL.       PT Short Term Goals - 05/24/21 1238       PT SHORT TERM GOAL #1   Title Patient will be indepedent and 100% compliant with established HEP and activity modification as needed to augment PT intervention and prevent flare-up of patient condition.    Baseline 05/04/21: Full  compliance with home program    Time 2    Period Weeks    Status Achieved    Target Date 04/28/21      PT SHORT TERM GOAL #2   Title Patient will have full thoracolumbar AROM without reproduction of pain as needed for functional reaching, overhead activity, self-care ADLs    Baseline Difficulty with return to neutral with lumbar flexion. Pain with L-sided sidebend and rotation. 05/24/21: thoracolumbar AROM WFL without reproduction of pain    Time 4    Period Weeks    Status Achieved    Target Date 05/12/21      PT SHORT TERM GOAL #3   Title Patient will tolerate standing up to 30 minutes without reproduction of pain as needed for completing household chores, community-level mobility, and social/community outings    Baseline Difficulty standing > 5 mins. 05/24/21: tolerates standing up to 15 minutes    Time 4    Period Weeks    Status Partially Met    Target Date 05/12/21  PT Long Term Goals - 05/24/21 1242       PT LONG TERM GOAL #1   Title Patient will demonstrate improved function as evidenced by a score of 56 on FOTO measure for full participation in activities at home and in the community.    Baseline IE FOTO 39, 05/24/21: FOTO 40    Time 8    Period Weeks    Status Not Met    Target Date 06/09/21      PT LONG TERM GOAL #2   Title Patient will tolerate walking up to 1 hour without reproduction of pain in back as needed for community outings, grocery shopping, household and outdoor chores    Baseline Difficulty walking > 10 minutes; 05/24/21: difficulty walking > 10 minutes    Time 8    Period Weeks    Status Not Met      PT LONG TERM GOAL #3   Title Patient will perform up to 30 minutes of standing/walking activity in clinic without reproduction of back pain necessitating sitting break indicative of improved standing tolerance and ability to complete community-level mobility tasks, social outings, grocery shopping    Baseline Difficulty standing > 5 mins,  walking > 10 mins; 05/24/21: up to 15 minutes of standing activity in clinic    Time 8    Period Weeks    Status Partially Met    Target Date 06/09/21                   Plan - 05/27/21 1328     Clinical Impression Statement Patient has fair tolerance of additional graded loading performed in clinic today. He has good gluteus maximus/hip extensor strength per manual muscle testing today with no onset of pain during testing. Patient has similar limitation in volume of standing/upright activity. Will progress graded loading and graded activity as tolerated with pt continuing HEP and aerobic conditioning work outside of clinic. Current plan is to follow up with referring provider and continue PT until patient is able to discuss medical management with Georgian Co, FNP. Patient has made modest progress to date and will benefit from continued skilled therapeutic intervention to address remaining pain, ROM deficits, and standing/walking actvity limitations as needed for improved QoL.    Personal Factors and Comorbidities Comorbidity 3+;Fitness   Hx of thoracoscopy for empyema, R side   Comorbidities obesity, Type 2 DM, Hx of COVID-19 infection and persistent SOB, HTN, Hx of thoracoscopy for R empyema    Examination-Activity Limitations Stand;Locomotion Level;Sleep    Examination-Participation Restrictions Cleaning;Church;Community Activity;Shop    Stability/Clinical Decision Making Evolving/Moderate complexity    Rehab Potential Good    PT Frequency 2x / week    PT Duration 8 weeks    PT Treatment/Interventions Electrical Stimulation;Therapeutic activities;Therapeutic exercise;Neuromuscular re-education;Manual techniques;Dry needling;Patient/family education    PT Next Visit Plan Repeated flexion/flexion-bias program. Increase emphasis on improving aerobic and muscular endurance, strengthening and standing activities as able. Pt to follow-up with refering provider.    PT Home Exercise Plan  Access Code [email or web address]    TDHRCBULA and Agree with Plan of Care Patient             Patient will benefit from skilled therapeutic intervention in order to improve the following deficits and impairments:  Decreased endurance, Decreased mobility, Difficulty walking, Hypomobility, Pain, Impaired flexibility, Decreased strength, Decreased activity tolerance, Decreased range of motion  Visit Diagnosis: Acute right-sided low back pain without sciatica  Difficulty in walking, not elsewhere classified  Muscle  weakness (generalized)     Problem List Patient Active Problem List   Diagnosis Date Noted   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) 11/11/2019   History of COVID-19 pneumonia 11/10/2019   COVID-19 11/10/2019   Acute respiratory failure ([place]) 11/09/2019   Health care maintenance 07/29/2019   Metatarsalgia of left foot 06/18/2019   Prediabetes 05/08/2019   Peripheral neuropathy 05/08/2019   Osteoarthritis of both knees 05/08/2019   Osteoarthritis of right knee 02/15/2017   Hand cramps 11/09/2016   Tinea pedis 06/04/2016   Low HDL (under 40) 05/09/2016   High triglycerides 05/09/2016   Medication monitoring encounter 05/03/2016   Controlled type 2 diabetes mellitus without complication, without long-term current use of insulin ([place]) 01/04/2016   Needs flu shot 01/04/2016   Morbid obesity ([place]) 01/04/2016   Screening for STD (sexually transmitted disease) 01/04/2016   Encounter for cholesteral  screening for cardiovascular disease 01/04/2016   AD (atopic dermatitis) 06/09/2015   Chronic low back pain 06/09/2015   Hypertension 06/09/2015   Type 2 diabetes, controlled, with peripheral neuropathy ([place]) 12/23/2014   Valentina Gu, PT, DPT #[id number] Eilleen Kempf 05/27/2021, 1:32 PM  [facility] [address] [place], [place], [address] Phone: [phone]   Fax:  [phone]  Name: Nathaniel Webb MRN: [id number] Date of Birth: Jan 29, 1966

## 2021-05-31 ENCOUNTER — Ambulatory Visit: Payer: Medicaid Other | Admitting: Physical Therapy

## 2021-05-31 ENCOUNTER — Other Ambulatory Visit: Payer: Self-pay

## 2021-05-31 DIAGNOSIS — M545 Low back pain, unspecified: Secondary | ICD-10-CM | POA: Diagnosis not present

## 2021-05-31 DIAGNOSIS — M6281 Muscle weakness (generalized): Secondary | ICD-10-CM

## 2021-05-31 DIAGNOSIS — R262 Difficulty in walking, not elsewhere classified: Secondary | ICD-10-CM

## 2021-05-31 NOTE — Therapy (Signed)
[facility] [address]. [place], [place], [address] Phone: [phone]   Fax:  [phone]  Physical Therapy Treatment  Patient Details  Name: Nathaniel Webb MRN: [id number] Date of Birth: 1966/07/07 Referring Provider (PT): Mechele Claude, FNP   Encounter Date: 05/31/2021   PT End of Session - 05/31/21 1126     Visit Number 11    Number of Visits 16    Date for PT Re-Evaluation 06/09/21    Authorization Type [employer] Medicaid VL 27 combined PT/OT/SLP per calendar year    Authorization Time Period Eval 06/21/1693, recert 03/15/8881    Authorization - Visit Number 11    Progress Note Due on Visit 16    PT Start Time 1118    PT Stop Time 1203    PT Time Calculation (min) 45 min    Activity Tolerance Patient tolerated treatment well;No increased pain;Patient limited by fatigue   Patient has SOB secondary to hold lung Sx and subsequent COVID-19 infection   Behavior During Therapy [facility] for tasks assessed/performed             Past Medical History:  Diagnosis Date   Diabetes mellitus without complication ([place])    Heel pain 06/09/2015   High triglycerides 05/09/2016   Hypertension    Low HDL (under 40) 05/09/2016   Obesity, Class II, BMI 35-39.9, with comorbidity ([place]) 01/04/2016    Past Surgical History:  Procedure Laterality Date   VIDEO ASSISTED THORACOSCOPY (VATS)/EMPYEMA Right 12/18/2019   Procedure: VIDEO ASSISTED THORACOSCOPY (VATS)/EMPYEMA;  Surgeon: Ivin Poot, MD;  Location: [place];  Service: Thoracic;  Laterality: Right;    There were no vitals filed for this visit.   Subjective Assessment - 05/31/21 1120     Subjective Patient states he has not heard back yet from referring provider's office. Patient reports feeling well this AM. Patient reports some moderate soreness after his last visit. No change with tolerance of walking. He denies notable pain at arrival to PT, since he has not performed high volume of walking  yet this AM.    Pertinent History Patient is a 55 year old male with primary complaint of low back pain with Hx of R lung surgery 2 years ago (unspecified) with some shortness of breath following this. SOB was also exacerbated with previous COVID-19 infection. Pt reports onset of low back pain about 2 months ago. Pt reports that when he starts walking, he has pain in lumbosacral region. Pt reports Hx of peripheral neuropathy for about 5 years. Pt denies new numbness or paresthesias. Patient reports difficulty getting to sleep; pt denies pain waking up from his sleep. Pt reports no change in bowel/bladder with onset of back pain. No recent imaging on low back. Patient reports no pain at rest. He reports 10/10 pain with standing and walking. Time of day does not affect pain. Pt does use rolling walker for prolonged walking; he will use grocery cart at grocery. 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    Limitations Standing;Walking    How long can you stand comfortably? 15 minutes    How long can you walk comfortably? 10 minutes    Patient Stated Goals Able to complete more physical activity    Currently in Pain? No/denies    Pain Onset More than a month ago             Treatment Performed  Therapeutic exercise - for thoracolumbar mobility, paraspinal  and posterior LE chain soft tissue mobility, ROM   Sit to stand from table; 2x10, with 6-lb Med ball   Lower trunk rotation, with QL bias (LE in figure-4 position); x10 ea side   Patient education: HEP review; discussed recommendation for low-impact aerobic activity to perform during the week for cardiovascular conditioning and reviewed available resources for pedals that can be used at home     *not today* Double knee to chest; 1x10 Repeated extension in standing; x10 Prone knee bend, x20 bilateral Seated swiss ball rollout, Blue Swiss ball, 3-way (forward, diagonal L and R); x5, 5 sec  each dir   Single knee to chest; x10 each LE, 1 sec hold        Neuromuscular Re-education - for lumbopelvic control, trunk stabilization, nervous system downregulation via graded exposure to upright/standing activity   Bridge with iso hold; x10, 5 sec hold  Seated Physioball March; Blue ball; 2x10 alternating   Pallof press with Nautilus; 40 lbs; x20 each direction   Nautilus core walkout, belt around waist; posterior only today, length of agility ladder; 50 lbs; x5 D/B length of agility ladder   *not today* Standing minisquat; 2x10  with Goblet hold, 8-lb Dbell Dying bug from hooklying position; x15 alternating L/R [demonstration and verbal cueing for alternating technique] Posterior pelvic tilt; seated on blue physioball; 2x10 [heavy verbal and tactile cues, demonstration] Open book; x10 each side       ASSESSMENT Patient has progressed notably with tolerance of standing and upright activity in clinic. Program is further progressed for increased emphasis on muscular endurance and increasing volume of exercise. He reports notable onset of low back pain at end of core walkouts with Nautilus that is alleviated following brief sitting period. Patient has progressed with ROM, strength, and activity tolerance in clinic; however, he feels that his primary c/o pain with standing/walking is unchanged. Current plan is to follow up with referring provider and continue PT until patient is able to discuss medical management with Georgian Co, FNP. A message was left for A. Enid Derry following this appointment. Patient has made modest progress to date and will benefit from continued skilled therapeutic intervention to address remaining pain, positional tolerance deficits, strength, and standing/walking activity limitations as needed for improved QoL.       PT Short Term Goals - 05/24/21 1238       PT SHORT TERM GOAL #1   Title Patient will be indepedent and 100% compliant with established HEP  and activity modification as needed to augment PT intervention and prevent flare-up of patient condition.    Baseline 05/04/21: Full compliance with home program    Time 2    Period Weeks    Status Achieved    Target Date 04/28/21      PT SHORT TERM GOAL #2   Title Patient will have full thoracolumbar AROM without reproduction of pain as needed for functional reaching, overhead activity, self-care ADLs    Baseline Difficulty with return to neutral with lumbar flexion. Pain with L-sided sidebend and rotation. 05/24/21: thoracolumbar AROM WFL without reproduction of pain    Time 4    Period Weeks    Status Achieved    Target Date 05/12/21      PT SHORT TERM GOAL #3   Title Patient will tolerate standing up to 30 minutes without reproduction of pain as needed for completing household chores, community-level mobility, and social/community outings    Baseline Difficulty standing > 5 mins. 05/24/21: tolerates standing up  to 15 minutes    Time 4    Period Weeks    Status Partially Met    Target Date 05/12/21               PT Long Term Goals - 05/24/21 1242       PT LONG TERM GOAL #1   Title Patient will demonstrate improved function as evidenced by a score of 56 on FOTO measure for full participation in activities at home and in the community.    Baseline IE FOTO 39, 05/24/21: FOTO 40    Time 8    Period Weeks    Status Not Met    Target Date 06/09/21      PT LONG TERM GOAL #2   Title Patient will tolerate walking up to 1 hour without reproduction of pain in back as needed for community outings, grocery shopping, household and outdoor chores    Baseline Difficulty walking > 10 minutes; 05/24/21: difficulty walking > 10 minutes    Time 8    Period Weeks    Status Not Met      PT LONG TERM GOAL #3   Title Patient will perform up to 30 minutes of standing/walking activity in clinic without reproduction of back pain necessitating sitting break indicative of improved standing  tolerance and ability to complete community-level mobility tasks, social outings, grocery shopping    Baseline Difficulty standing > 5 mins, walking > 10 mins; 05/24/21: up to 15 minutes of standing activity in clinic    Time 8    Period Weeks    Status Partially Met    Target Date 06/09/21                   Plan - 06/01/21 1311     Clinical Impression Statement Patient has progressed notably with tolerance of standing and upright activity in clinic. Program is further progressed for increased emphasis on muscular endurance and increasing volume of exercise. He reports notable onset of low back pain at end of core walkouts with Nautilus that is alleviated following brief sitting period. Patient has progressed with ROM, strength, and activity tolerance in clinic; however, he feels that his primary c/o pain with standing/walking is unchanged. Current plan is to follow up with referring provider and continue PT until patient is able to discuss medical management with Georgian Co, FNP. A message was left for A. Enid Derry following this appointment. Patient has made modest progress to date and will benefit from continued skilled therapeutic intervention to address remaining pain, positional tolerance deficits, strength, and standing/walking activity limitations as needed for improved QoL.    Personal Factors and Comorbidities Comorbidity 3+;Fitness   Hx of thoracoscopy for empyema, R side   Comorbidities obesity, Type 2 DM, Hx of COVID-19 infection and persistent SOB, HTN, Hx of thoracoscopy for R empyema    Examination-Activity Limitations Stand;Locomotion Level;Sleep    Examination-Participation Restrictions Cleaning;Church;Community Activity;Shop    Stability/Clinical Decision Making Evolving/Moderate complexity    Rehab Potential Good    PT Frequency 2x / week    PT Duration 8 weeks    PT Treatment/Interventions Electrical Stimulation;Therapeutic activities;Therapeutic  exercise;Neuromuscular re-education;Manual techniques;Dry needling;Patient/family education    PT Next Visit Plan Repeated flexion/flexion-bias program. Increase emphasis on improving aerobic and muscular endurance, strengthening and standing activities as able. Pt to follow-up with refering provider.    PT Home Exercise Plan Access Code [email or web address]    [id number] and Agree with Plan of Care Patient  Patient will benefit from skilled therapeutic intervention in order to improve the following deficits and impairments:  Decreased endurance, Decreased mobility, Difficulty walking, Hypomobility, Pain, Impaired flexibility, Decreased strength, Decreased activity tolerance, Decreased range of motion  Visit Diagnosis: Acute right-sided low back pain without sciatica  Difficulty in walking, not elsewhere classified  Muscle weakness (generalized)     Problem List Patient Active Problem List   Diagnosis Date Noted   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   Postop check 01/21/2020   Cough 01/08/2020   Shortness of breath 01/08/2020   Empyema of pleural space ([place]) 12/18/2019   Chest tube in place    ARDS (adult respiratory distress syndrome) ([place])    Empyema lung ([facility])    Hydropneumothorax    MSSA bacteremia 11/25/2019   Constipation 11/17/2019   Diabetes mellitus type 2, uncontrolled, with complications ([place]) 92/49/3241   Acute pulmonary embolus ([place]) 11/12/2019   Demand ischemia ([place]) 11/12/2019   Acute respiratory failure with hypoxemia ([place]) 11/11/2019   History of COVID-19 pneumonia 11/10/2019   COVID-19 11/10/2019   Acute respiratory failure ([place]) 11/09/2019   Health care maintenance 07/29/2019   Metatarsalgia of left foot 06/18/2019   Prediabetes 05/08/2019   Peripheral neuropathy 05/08/2019   Osteoarthritis of both knees 05/08/2019   Osteoarthritis of right knee 02/15/2017   Hand cramps 11/09/2016   Tinea pedis 06/04/2016   Low HDL (under 40) 05/09/2016   High triglycerides  05/09/2016   Medication monitoring encounter 05/03/2016   Controlled type 2 diabetes mellitus without complication, without long-term current use of insulin ([place]) 01/04/2016   Needs flu shot 01/04/2016   Morbid obesity ([place]) 01/04/2016   Screening for STD (sexually transmitted disease) 01/04/2016   Encounter for cholesteral screening for cardiovascular disease 01/04/2016   AD (atopic dermatitis) 06/09/2015   Chronic low back pain 06/09/2015   Hypertension 06/09/2015   Type 2 diabetes, controlled, with peripheral neuropathy ([place]) 12/23/2014   Valentina Gu, PT, DPT #[id number] Eilleen Kempf 06/01/2021, 1:12 PM  [place] [facility] [address]. [place], [place], [address] Phone: [phone]   Fax:  [phone]  Name: Nathaniel Webb MRN: [id number] Date of Birth: 03/09/1966

## 2021-06-01 ENCOUNTER — Encounter: Payer: Self-pay | Admitting: Physical Therapy

## 2021-06-02 ENCOUNTER — Other Ambulatory Visit: Payer: Self-pay

## 2021-06-02 ENCOUNTER — Encounter: Payer: Self-pay | Admitting: Physical Therapy

## 2021-06-02 ENCOUNTER — Ambulatory Visit: Payer: Medicaid Other | Admitting: Physical Therapy

## 2021-06-02 DIAGNOSIS — R262 Difficulty in walking, not elsewhere classified: Secondary | ICD-10-CM

## 2021-06-02 DIAGNOSIS — M545 Low back pain, unspecified: Secondary | ICD-10-CM | POA: Diagnosis not present

## 2021-06-02 DIAGNOSIS — M6281 Muscle weakness (generalized): Secondary | ICD-10-CM

## 2021-06-02 NOTE — Therapy (Signed)
[facility] [address] [place], [place], [address] Phone: [phone]   Fax:  [phone]  Physical Therapy Treatment  Patient Details  Name: KNOX CERVI MRN: [id number] Date of Birth: 1966/05/26 Referring Provider (PT): Mechele Claude, FNP   Encounter Date: 06/02/2021   PT End of Session - 06/05/21 0831     Visit Number 12    Number of Visits 16    Date for PT Re-Evaluation 06/09/21    Authorization Type [employer] Medicaid VL 27 combined PT/OT/SLP per calendar year    Authorization Time Period Eval 04/21/4853, recert 05/09/349    Authorization - Visit Number 12    Progress Note Due on Visit 16    PT Start Time 1120    PT Stop Time 1207    PT Time Calculation (min) 47 min    Activity Tolerance Patient tolerated treatment well;No increased pain;Patient limited by fatigue   Patient has SOB secondary to hold lung Sx and subsequent COVID-19 infection   Behavior During Therapy [facility] for tasks assessed/performed             Past Medical History:  Diagnosis Date   Diabetes mellitus without complication ([place])    Heel pain 06/09/2015   High triglycerides 05/09/2016   Hypertension    Low HDL (under 40) 05/09/2016   Obesity, Class II, BMI 35-39.9, with comorbidity ([place]) 01/04/2016    Past Surgical History:  Procedure Laterality Date   VIDEO ASSISTED THORACOSCOPY (VATS)/EMPYEMA Right 12/18/2019   Procedure: VIDEO ASSISTED THORACOSCOPY (VATS)/EMPYEMA;  Surgeon: Ivin Poot, MD;  Location: [place];  Service: Thoracic;  Laterality: Right;    There were no vitals filed for this visit.   Subjective Assessment - 06/05/21 0832     Subjective Patient reports doing well with updated HEP. He reports pain with prolonged standing in shower and in his bathroom. Patient reports he still attempting to contact her referring provider. Patient has questions about his cholesterol medication management as well. Patient reports no other new/emerging  activity limitations - ongoing difficulty with prolonged standing/walking.    Pertinent History Patient is a 55 year old male with primary complaint of low back pain with Hx of R lung surgery 2 years ago (unspecified) with some shortness of breath following this. SOB was also exacerbated with previous COVID-19 infection. Pt reports onset of low back pain about 2 months ago. Pt reports that when he starts walking, he has pain in lumbosacral region. Pt reports Hx of peripheral neuropathy for about 5 years. Pt denies new numbness or paresthesias. Patient reports difficulty getting to sleep; pt denies pain waking up from his sleep. Pt reports no change in bowel/bladder with onset of back pain. No recent imaging on low back. Patient reports no pain at rest. He reports 10/10 pain with standing and walking. Time of day does not affect pain. Pt does use rolling walker for prolonged walking; he will use grocery cart at grocery. 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    Limitations Standing;Walking    How long can you stand comfortably? 15 minutes    How long can you walk comfortably? 10 minutes    Patient Stated Goals Able to complete more physical activity    Currently in Pain? No/denies    Pain Onset More than a month ago             Treatment Performed  Therapeutic exercise - for thoracolumbar mobility, paraspinal and  posterior LE chain soft tissue mobility, ROM   Minisquat with Goblet hold; 2x10, with 6-lb Med ball   Lower trunk rotation, with QL bias (LE in figure-4 position); x10 ea side   Patient education: Reviewed available resources for pedals to be used to low-impact cardiovascular exercise at home     *not today* Double knee to chest; 1x10 Repeated extension in standing; x10 Prone knee bend, x20 bilateral Seated swiss ball rollout, Blue Swiss ball, 3-way (forward, diagonal L and R); x5, 5 sec each dir   Single knee to chest; x10  each LE, 1 sec hold        Neuromuscular Re-education - for lumbopelvic control, trunk stabilization, nervous system downregulation via graded exposure to upright/standing activity   Bridge with iso hold; x20, 5 sec hold   Seated Physioball March; Blue ball; 2x10 alternating upper extremities/lower extremities (shoulder/hip flexion)   Pallof press with Nautilus; 40 lbs; x20 each direction   Nautilus core walkout, belt around waist; posterior only today, length of agility ladder; 50 lbs; x5 D/B length of agility ladder    *not today* Standing minisquat; 2x10  with Goblet hold, 8-lb Dbell Dying bug from hooklying position; x15 alternating L/R [demonstration and verbal cueing for alternating technique] Posterior pelvic tilt; seated on blue physioball; 2x10 [heavy verbal and tactile cues, demonstration] Open book; x10 each side         ASSESSMENT Patient tolerates continued progression of graded loading and graded exposure to upright activities/standing activities well. He is largely limited by aerobic deconditioning secondary to SOB following his thoracoscopy for a R thoracic empyema and s/p COVID-19 infection. He does require brief period of sitting after performing standing Nautilus exercises. Patient unfortunately is still having significant limitation in volume of standing/walking activity tolerated. Current plan is to follow up with referring provider and continue PT until patient is able to discuss medical management with Georgian Co, FNP. A message was left for A. Enid Derry following this appointment. Patient has made modest progress to date and will benefit from continued skilled therapeutic intervention to address remaining pain, positional tolerance deficits, strength, and standing/walking activity limitations as needed for improved QoL.            PT Short Term Goals - 05/24/21 1238       PT SHORT TERM GOAL #1   Title Patient will be indepedent and 100% compliant with  established HEP and activity modification as needed to augment PT intervention and prevent flare-up of patient condition.    Baseline 05/04/21: Full compliance with home program    Time 2    Period Weeks    Status Achieved    Target Date 04/28/21      PT SHORT TERM GOAL #2   Title Patient will have full thoracolumbar AROM without reproduction of pain as needed for functional reaching, overhead activity, self-care ADLs    Baseline Difficulty with return to neutral with lumbar flexion. Pain with L-sided sidebend and rotation. 05/24/21: thoracolumbar AROM WFL without reproduction of pain    Time 4    Period Weeks    Status Achieved    Target Date 05/12/21      PT SHORT TERM GOAL #3   Title Patient will tolerate standing up to 30 minutes without reproduction of pain as needed for completing household chores, community-level mobility, and social/community outings    Baseline Difficulty standing > 5 mins. 05/24/21: tolerates standing up to 15 minutes    Time 4    Period Weeks  Status Partially Met    Target Date 05/12/21               PT Long Term Goals - 05/24/21 1242       PT LONG TERM GOAL #1   Title Patient will demonstrate improved function as evidenced by a score of 56 on FOTO measure for full participation in activities at home and in the community.    Baseline IE FOTO 39, 05/24/21: FOTO 40    Time 8    Period Weeks    Status Not Met    Target Date 06/09/21      PT LONG TERM GOAL #2   Title Patient will tolerate walking up to 1 hour without reproduction of pain in back as needed for community outings, grocery shopping, household and outdoor chores    Baseline Difficulty walking > 10 minutes; 05/24/21: difficulty walking > 10 minutes    Time 8    Period Weeks    Status Not Met      PT LONG TERM GOAL #3   Title Patient will perform up to 30 minutes of standing/walking activity in clinic without reproduction of back pain necessitating sitting break indicative of improved  standing tolerance and ability to complete community-level mobility tasks, social outings, grocery shopping    Baseline Difficulty standing > 5 mins, walking > 10 mins; 05/24/21: up to 15 minutes of standing activity in clinic    Time 8    Period Weeks    Status Partially Met    Target Date 06/09/21                   Plan - 06/05/21 0845     Clinical Impression Statement Patient tolerates continued progression of graded loading and graded exposure to upright activities/standing activities well. He is largely limited by aerobic deconditioning secondary to SOB following his thoracoscopy for a R thoracic empyema and s/p COVID-19 infection. He does require brief period of sitting after performing standing Nautilus exercises. Patient unfortunately is still having significant limitation in volume of standing/walking activity tolerated. Current plan is to follow up with referring provider and continue PT until patient is able to discuss medical management with Georgian Co, FNP. A message was left for A. Enid Derry following this appointment. Patient has made modest progress to date and will benefit from continued skilled therapeutic intervention to address remaining pain, positional tolerance deficits, strength, and standing/walking activity limitations as needed for improved QoL.    Personal Factors and Comorbidities Comorbidity 3+;Fitness   Hx of thoracoscopy for empyema, R side   Comorbidities obesity, Type 2 DM, Hx of COVID-19 infection and persistent SOB, HTN, Hx of thoracoscopy for R empyema    Examination-Activity Limitations Stand;Locomotion Level;Sleep    Examination-Participation Restrictions Cleaning;Church;Community Activity;Shop    Stability/Clinical Decision Making Evolving/Moderate complexity    Rehab Potential Good    PT Frequency 2x / week    PT Duration 8 weeks    PT Treatment/Interventions Electrical Stimulation;Therapeutic activities;Therapeutic exercise;Neuromuscular  re-education;Manual techniques;Dry needling;Patient/family education    PT Next Visit Plan Repeated flexion/flexion-bias program. Increase emphasis on improving aerobic and muscular endurance, strengthening and standing activities as able. Pt to follow-up with refering provider.    PT Home Exercise Plan Access Code [email or web address]    HENIDPOEU and Agree with Plan of Care Patient             Patient will benefit from skilled therapeutic intervention in order to improve the following deficits and impairments:  Decreased  endurance, Decreased mobility, Difficulty walking, Hypomobility, Pain, Impaired flexibility, Decreased strength, Decreased activity tolerance, Decreased range of motion  Visit Diagnosis: Acute right-sided low back pain without sciatica  Difficulty in walking, not elsewhere classified  Muscle weakness (generalized)     Problem List Patient Active Problem List   Diagnosis Date Noted   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   Postop check 01/21/2020   Cough 01/08/2020   Shortness of breath 01/08/2020   Empyema of pleural space ([place]) 12/18/2019   Chest tube in place    ARDS (adult respiratory distress syndrome) ([place])    Empyema lung (HCC)    Hydropneumothorax    MSSA bacteremia 11/25/2019   Constipation 11/17/2019   Diabetes mellitus type 2, uncontrolled, with complications ([place]) 82/80/0349   Acute pulmonary embolus ([place]) 11/12/2019   Demand ischemia ([place]) 11/12/2019   Acute respiratory failure with hypoxemia ([place]) 11/11/2019   History of COVID-19 pneumonia 11/10/2019   COVID-19 11/10/2019   Acute respiratory failure ([place]) 11/09/2019   Health care maintenance 07/29/2019   Metatarsalgia of left foot 06/18/2019   Prediabetes 05/08/2019   Peripheral neuropathy 05/08/2019   Osteoarthritis of both knees 05/08/2019   Osteoarthritis of right knee 02/15/2017   Hand cramps 11/09/2016   Tinea pedis 06/04/2016   Low HDL (under 40) 05/09/2016   High triglycerides 05/09/2016   Medication  monitoring encounter 05/03/2016   Controlled type 2 diabetes mellitus without complication, without long-term current use of insulin ([place]) 01/04/2016   Needs flu shot 01/04/2016   Morbid obesity ([place]) 01/04/2016   Screening for STD (sexually transmitted disease) 01/04/2016   Encounter for cholesteral screening for cardiovascular disease 01/04/2016   AD (atopic dermatitis) 06/09/2015   Chronic low back pain 06/09/2015   Hypertension 06/09/2015   Type 2 diabetes, controlled, with peripheral neuropathy ([place]) 12/23/2014   Valentina Gu, PT, DPT #[id number] Eilleen Kempf 06/05/2021, 8:47 AM  [facility] [address] [place], [place], [address] Phone: [phone]   Fax:  [phone]  Name: DUGLAS HEIER MRN: [id number] Date of Birth: 08/17/1966

## 2021-06-07 ENCOUNTER — Encounter: Payer: Medicaid Other | Admitting: Physical Therapy

## 2021-06-09 ENCOUNTER — Ambulatory Visit: Payer: Medicaid Other | Admitting: Physical Therapy

## 2022-07-07 ENCOUNTER — Ambulatory Visit: Payer: Medicaid Other | Admitting: Podiatry

## 2022-07-07 ENCOUNTER — Ambulatory Visit: Payer: No Typology Code available for payment source

## 2022-07-07 DIAGNOSIS — M7752 Other enthesopathy of left foot: Secondary | ICD-10-CM

## 2022-07-07 MED ORDER — BETAMETHASONE SOD PHOS & ACET 6 (3-3) MG/ML IJ SUSP: 3.0000 mg | Freq: Once | INTRAMUSCULAR | Status: DC

## 2022-07-07 NOTE — Progress Notes (Signed)
Chief Complaint  Patient presents with   Foot Pain    Patient is having foot pain in the balls of both feet.    HPI: 56- year-old male presenting today for follow up evaluation of left foot pain.  Patient was last seen in the office on 10/14/2019.  He states that he continues to have pain and tenderness to both forefoot.  He is inquiring about diabetic shoes.  He presents for further treatment and evaluation   Past Medical History:  Diagnosis Date   Diabetes mellitus without complication (HCC)    Heel pain 06/09/2015   High triglycerides 05/09/2016   Hypertension    Low HDL (under 40) 05/09/2016   Obesity, Class II, BMI 35-39.9, with comorbidity (HCC) 01/04/2016     Physical Exam: General: The patient is alert and oriented x3 in no acute distress.  Dermatology: Skin is warm, dry and supple bilateral lower extremities. Negative for open lesions or macerations.  Vascular: Palpable pedal pulses bilaterally. No edema or erythema noted. Capillary refill within normal limits.  Neurological: Epicritic and protective threshold grossly intact bilaterally.   Musculoskeletal Exam: Pain with palpation noted to the 2nd MPJ of the left foot. Range of motion within normal limits to all pedal and ankle joints bilateral. Muscle strength 5/5 in all groups bilateral.   Assessment: 1. 2nd MPJ capsulitis left    Plan of Care:  1. Patient evaluated.  2. Injection of 0.5 mLs Celestone Soluspan injected into the 2nd MPJ of the left foot.  3. Met pads dispensed.  4. Continue wearing New Balance shoes.  5.  Patient is inquiring about diabetic shoes with diabetic insoles.  Currently our office is short staffed and not excepting new fittings for diabetic shoes with insoles.  We will place him on the wait list.  I did recommend the shoe market on [address] in [place].  Return to clinic as needed     Brent M. Evans, DPM [facility]  Dr. Brent M. Evans, DPM    [address]                                        [place], [place] [address]                Office [phone]  Fax [phone]    

## 2022-09-18 LAB — BASIC METABOLIC PANEL
BUN: 17 (ref 4–21)
CO2: 21 (ref 13–22)
Chloride: 100 (ref 99–108)
Creatinine: 1.2 (ref 0.6–1.3)
Glucose: 106
Potassium: 5 mEq/L (ref 3.5–5.1)
Sodium: 142 (ref 137–147)

## 2022-09-18 LAB — COMPREHENSIVE METABOLIC PANEL
Albumin: 4.4 (ref 3.5–5.0)
Calcium: 9.7 (ref 8.7–10.7)
Globulin: 3.8
eGFR: 71

## 2022-09-18 LAB — CBC AND DIFFERENTIAL
HCT: 43 (ref 41–53)
Hemoglobin: 14.2 (ref 13.5–17.5)
Neutrophils Absolute: 8.1
Platelets: 286 10*3/uL (ref 150–400)
WBC: 11.8

## 2022-09-18 LAB — HEPATIC FUNCTION PANEL
ALT: 41 U/L — AB (ref 10–40)
AST: 42 — AB (ref 14–40)
Alkaline Phosphatase: 123 (ref 25–125)
Bilirubin, Total: 0.6

## 2022-09-18 LAB — LIPID PANEL
Cholesterol: 158 (ref 0–200)
HDL: 34 — AB (ref 35–70)
LDL Cholesterol: 106
Triglycerides: 99 (ref 40–160)

## 2022-09-18 LAB — TSH: TSH: 1.52 (ref 0.41–5.90)

## 2022-09-18 LAB — HEMOGLOBIN A1C: Hemoglobin A1C: 6.5

## 2022-09-18 LAB — CBC: RBC: 5.45 — AB (ref 3.87–5.11)

## 2022-12-26 ENCOUNTER — Encounter: Payer: Self-pay | Admitting: Family

## 2022-12-26 ENCOUNTER — Ambulatory Visit: Payer: Medicaid Other | Admitting: Family

## 2022-12-26 VITALS — BP 132/78 | HR 84 | Ht 67.0 in | Wt 243.8 lb

## 2022-12-26 DIAGNOSIS — Z8709 Personal history of other diseases of the respiratory system: Secondary | ICD-10-CM

## 2022-12-26 DIAGNOSIS — Z6838 Body mass index (BMI) 38.0-38.9, adult: Secondary | ICD-10-CM

## 2022-12-26 DIAGNOSIS — U099 Post covid-19 condition, unspecified: Secondary | ICD-10-CM | POA: Diagnosis not present

## 2022-12-26 DIAGNOSIS — R062 Wheezing: Secondary | ICD-10-CM

## 2022-12-26 DIAGNOSIS — E1142 Type 2 diabetes mellitus with diabetic polyneuropathy: Secondary | ICD-10-CM | POA: Diagnosis not present

## 2022-12-26 LAB — POC CREATINE & ALBUMIN,URINE
Albumin/Creatinine Ratio, Urine, POC: <30
Creatinine, POC: 100 mg/dL
Microalbumin Ur, POC: 30 mg/L

## 2022-12-26 MED ORDER — AMOXICILLIN-POT CLAVULANATE 875-125 MG PO TABS: 1.0000 | ORAL_TABLET | Freq: Two times a day (BID) | ORAL | 0 refills | Status: DC

## 2022-12-26 NOTE — Progress Notes (Signed)
Established Patient Office Visit  Subjective:  Patient ID: Nathaniel Webb, male    DOB: 12-27-1965  Age: 57 y.o. MRN: [id number]  Chief Complaint  Patient presents with   Follow-up    2 week follow up    2 week follow up.  Patient here for his 2 week f/u.  He has been doing well, the inhaler did help with his wheezing.   He did have the chest x-ray, but now needs the CT scan, as the X-ray was not diagnostic.   We also need to evaluate the previous site of his pneumothorax.   No other concerns today.      Past Medical History:  Diagnosis Date   Acute pulmonary embolus ([place]) 11/12/2019   Acute respiratory failure ([place]) 11/09/2019   Acute respiratory failure with hypoxemia ([place]) 11/11/2019   ARDS (adult respiratory distress syndrome) (HCC)    Chest tube in place    COVID-19 11/10/2019   Diabetes mellitus without complication (HCC)    Empyema lung (HCC)    Empyema of pleural space ([place]) 12/18/2019   Heel pain 06/09/2015   High triglycerides 05/09/2016   History of COVID-19 pneumonia 11/10/2019   Hydropneumothorax    Hypertension    Low HDL (under 40) 05/09/2016   MSSA bacteremia 11/25/2019   Obesity, Class II, BMI 35-39.9, with comorbidity ([place]) 01/04/2016   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    Social History   Socioeconomic History   Marital status: Married    Spouse name: Not on file   Number of children: Not on file   Years of education: Not on file   Highest education level: Not on file  Occupational History   Not on file  Tobacco Use   Smoking status: Former   Smokeless tobacco: Never  Vaping Use   Vaping Use: Never used  Substance and Sexual Activity   Alcohol use: No    Alcohol/week: 0.0 standard drinks of alcohol   Drug use: No   Sexual activity: Yes    Partners: Female    Birth control/protection: None  Other Topics Concern   Not on file  Social History Narrative   Not on file   Social Determinants of Health   Financial Resource Strain: Not on  file  Food Insecurity: Not on file  Transportation Needs: Not on file  Physical Activity: Not on file  Stress: Not on file  Social Connections: Not on file  Intimate Partner Violence: Not on file    Family History  Problem Relation Age of Onset   Diabetes Father    COPD Mother     No Known Allergies  Review of Systems  Respiratory:  Positive for shortness of breath.   All other systems reviewed and are negative.      Objective:   BP 132/78   Pulse 84   Ht 5' 7"$  (1.702 m)   Wt 243 lb 12.8 oz (110.6 kg)   SpO2 94%   BMI 38.18 kg/m   Vitals:   12/26/22 0940  BP: 132/78  Pulse: 84  Height: 5' 7"$  (1.702 m)  Weight: 243 lb 12.8 oz (110.6 kg)  SpO2: 94%  BMI (Calculated): 38.18    Physical Exam Vitals and nursing note reviewed.  Constitutional:      Appearance: Normal appearance. He is normal weight.  Eyes:     Pupils: Pupils are equal, round, and reactive to light.  Cardiovascular:     Rate and Rhythm: Normal rate and regular rhythm.  Pulses: Normal pulses.     Heart sounds: Normal heart sounds.  Pulmonary:     Effort: Pulmonary effort is normal.     Breath sounds: Normal breath sounds.  Neurological:     Mental Status: He is alert.  Psychiatric:        Mood and Affect: Mood normal.        Behavior: Behavior normal.      Results for orders placed or performed in visit on 12/26/22  POC CREATINE & ALBUMIN,URINE  Result Value Ref Range   Microalbumin Ur, POC 30 mg/L   Creatinine, POC 100 mg/dL   Albumin/Creatinine Ratio, Urine, POC <30     Recent Results (from the past 2160 hour(s))  POC CREATINE & ALBUMIN,URINE     Status: Normal   Collection Time: 12/26/22 11:58 AM  Result Value Ref Range   Microalbumin Ur, POC 30 mg/L   Creatinine, POC 100 mg/dL   Albumin/Creatinine Ratio, Urine, POC <30       Assessment & Plan:   Problem List Items Addressed This Visit     Type 2 diabetes, controlled, with peripheral neuropathy ([place])   Relevant  Medications   FARXIGA 10 MG TABS tablet   Other Relevant Orders   POC CREATINE & ALBUMIN,URINE (Completed)   Other Visit Diagnoses     Diffuse wheezing    -  Primary   Relevant Orders   CT CHEST W CONTRAST   History of pneumothorax       Relevant Orders   CT CHEST W CONTRAST   Long COVID       Relevant Orders   CT CHEST W CONTRAST       Return as scheduled for now.   Total time spent: 20 minutes  Mechele Claude, FNP  12/26/2022

## 2022-12-26 NOTE — Assessment & Plan Note (Signed)
Checking Microalbumin in office today  Continue current meds  Will recheck A1C at follow up

## 2022-12-28 ENCOUNTER — Other Ambulatory Visit: Payer: Self-pay

## 2022-12-28 MED ORDER — BUDESONIDE-FORMOTEROL FUMARATE 160-4.5 MCG/ACT IN AERO: 2.0000 | INHALATION_SPRAY | Freq: Two times a day (BID) | RESPIRATORY_TRACT | 6 refills | Status: DC

## 2023-01-08 ENCOUNTER — Other Ambulatory Visit: Payer: Self-pay

## 2023-01-09 ENCOUNTER — Other Ambulatory Visit: Payer: Medicaid Other

## 2023-01-15 ENCOUNTER — Other Ambulatory Visit: Payer: Self-pay

## 2023-01-15 ENCOUNTER — Ambulatory Visit (INDEPENDENT_AMBULATORY_CARE_PROVIDER_SITE_OTHER): Payer: Medicaid Other

## 2023-01-15 ENCOUNTER — Other Ambulatory Visit: Payer: Self-pay | Admitting: Family

## 2023-01-15 DIAGNOSIS — U099 Post covid-19 condition, unspecified: Secondary | ICD-10-CM

## 2023-01-15 DIAGNOSIS — E781 Pure hyperglyceridemia: Secondary | ICD-10-CM

## 2023-01-15 DIAGNOSIS — R062 Wheezing: Secondary | ICD-10-CM

## 2023-01-15 DIAGNOSIS — Z8709 Personal history of other diseases of the respiratory system: Secondary | ICD-10-CM

## 2023-01-15 MED ORDER — ATORVASTATIN CALCIUM 10 MG PO TABS: 10.0000 mg | ORAL_TABLET | Freq: Every evening | ORAL | 0 refills | Status: DC

## 2023-01-15 MED ORDER — IOHEXOL 300 MG/ML  SOLN
100.0000 mL | Freq: Once | INTRAMUSCULAR | Status: AC | PRN
  Administered 2023-01-15: 100 mL via INTRAVENOUS

## 2023-01-18 ENCOUNTER — Ambulatory Visit: Payer: Medicaid Other | Admitting: Family

## 2023-01-18 ENCOUNTER — Encounter: Payer: Self-pay | Admitting: Family

## 2023-01-18 VITALS — BP 128/70 | HR 67 | Ht 65.0 in | Wt 240.0 lb

## 2023-01-18 DIAGNOSIS — E1142 Type 2 diabetes mellitus with diabetic polyneuropathy: Secondary | ICD-10-CM | POA: Diagnosis not present

## 2023-01-18 DIAGNOSIS — E559 Vitamin D deficiency, unspecified: Secondary | ICD-10-CM | POA: Diagnosis not present

## 2023-01-18 DIAGNOSIS — E538 Deficiency of other specified B group vitamins: Secondary | ICD-10-CM

## 2023-01-18 DIAGNOSIS — R0602 Shortness of breath: Secondary | ICD-10-CM

## 2023-01-18 DIAGNOSIS — E781 Pure hyperglyceridemia: Secondary | ICD-10-CM

## 2023-01-18 MED ORDER — ERGOCALCIFEROL 1.25 MG (50000 UT) PO CAPS: 50000.0000 [IU] | ORAL_CAPSULE | Freq: Every day | ORAL | 1 refills | Status: DC

## 2023-01-18 MED ORDER — ALBUTEROL SULFATE HFA 108 (90 BASE) MCG/ACT IN AERS: 2.0000 | INHALATION_SPRAY | Freq: Four times a day (QID) | RESPIRATORY_TRACT | 2 refills | Status: DC | PRN

## 2023-01-18 MED ORDER — VASCEPA 1 G PO CAPS: 2.0000 g | ORAL_CAPSULE | Freq: Two times a day (BID) | ORAL | 5 refills | Status: DC

## 2023-01-18 MED ORDER — FARXIGA 10 MG PO TABS: 10.0000 mg | ORAL_TABLET | Freq: Every day | ORAL | 3 refills | Status: DC

## 2023-01-18 MED ORDER — ATORVASTATIN CALCIUM 10 MG PO TABS: 10.0000 mg | ORAL_TABLET | Freq: Every evening | ORAL | 0 refills | Status: DC

## 2023-01-18 MED ORDER — SYMBICORT 160-4.5 MCG/ACT IN AERO: 2.0000 | INHALATION_SPRAY | Freq: Two times a day (BID) | RESPIRATORY_TRACT | 5 refills | Status: DC

## 2023-01-18 NOTE — Progress Notes (Signed)
Established Patient Office Visit  Subjective:  Patient ID: Nathaniel Webb, male    DOB: [birth year]  Age: 57 y.o. MRN: [id number]  Chief Complaint  Patient presents with   Follow-up    4 month follow up   Hypertension   COPD    Patient here for his 4 month f/u.  He has been doing better since last appointment.  Needs labs today.   Hypertension This is a chronic problem. The current episode started more than 1 year ago. The problem is unchanged. The problem is controlled. There are no associated agents to hypertension. Risk factors for coronary artery disease include diabetes mellitus, dyslipidemia, family history, obesity, male gender and sedentary lifestyle. Past treatments include ACE inhibitors and diuretics. The current treatment provides significant improvement. Compliance problems include diet and exercise.   COPD He complains of wheezing. This is a chronic problem. The current episode started more than 1 year ago. The problem occurs intermittently. The problem has been gradually improving. His symptoms are aggravated by lying down. His symptoms are alleviated by change in position and steroid inhaler. He reports complete improvement on treatment. His symptoms are not alleviated by diuretics. Risk factors: COVID Pneumnoia requiring hospitalization. His past medical history is significant for COPD and pneumonia.     Past Medical History:  Diagnosis Date   Acute pulmonary embolus ([place]) 11/12/2019   Acute respiratory failure ([place]) 11/09/2019   Acute respiratory failure with hypoxemia ([place]) 11/11/2019   ARDS (adult respiratory distress syndrome) (HCC)    Chest tube in place    COVID-19 11/10/2019   Diabetes mellitus without complication (HCC)    Empyema lung (HCC)    Empyema of pleural space ([place]) 12/18/2019   Heel pain 06/09/2015   High triglycerides 05/09/2016   History of COVID-19 pneumonia 11/10/2019   Hydropneumothorax    Hypertension    Low HDL (under 40) 05/09/2016    MSSA bacteremia 11/25/2019   Obesity, Class II, BMI 35-39.9, with comorbidity ([place]) 01/04/2016   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    Past Surgical History:  Procedure Laterality Date   VIDEO ASSISTED THORACOSCOPY (VATS)/EMPYEMA Right 12/18/2019   Procedure: VIDEO ASSISTED THORACOSCOPY (VATS)/EMPYEMA;  Surgeon: Ivin Poot, MD;  Location: [facility] OR;  Service: Thoracic;  Laterality: Right;    Social History   Socioeconomic History   Marital status: Married    Spouse name: Not on file   Number of children: Not on file   Years of education: Not on file   Highest education level: Not on file  Occupational History   Not on file  Tobacco Use   Smoking status: Former   Smokeless tobacco: Never  Vaping Use   Vaping Use: Never used  Substance and Sexual Activity   Alcohol use: No    Alcohol/week: 0.0 standard drinks of alcohol   Drug use: No   Sexual activity: Yes    Partners: Female    Birth control/protection: None  Other Topics Concern   Not on file  Social History Narrative   Not on file   Social Determinants of Health   Financial Resource Strain: Not on file  Food Insecurity: Not on file  Transportation Needs: Not on file  Physical Activity: Not on file  Stress: Not on file  Social Connections: Not on file  Intimate Partner Violence: Not on file    Family History  Problem Relation Age of Onset   Diabetes Father    COPD Mother     No Known Allergies  Review of Systems  Respiratory:  Positive for wheezing.   All other systems reviewed and are negative.      Objective:   BP 128/70   Pulse 67   Ht '5\' 5"'$  (1.651 m)   Wt 240 lb (108.9 kg)   SpO2 95%   BMI 39.94 kg/m   Vitals:   01/18/23 0911  BP: 128/70  Pulse: 67  Height: '5\' 5"'$  (1.651 m)  Weight: 240 lb (108.9 kg)  SpO2: 95%  BMI (Calculated): 39.94    Physical Exam Vitals and nursing note reviewed.  Constitutional:      Appearance: Normal appearance. He is normal weight.  Eyes:     Extraocular  Movements: Extraocular movements intact.     Pupils: Pupils are equal, round, and reactive to light.  Cardiovascular:     Rate and Rhythm: Normal rate and regular rhythm.     Pulses: Normal pulses.     Heart sounds: Normal heart sounds.  Pulmonary:     Effort: Pulmonary effort is normal.     Breath sounds: Examination of the right-lower field reveals decreased breath sounds. Decreased breath sounds present.  Neurological:     Mental Status: He is alert.  Psychiatric:        Mood and Affect: Mood normal.        Behavior: Behavior normal.      No results found for any visits on 01/18/23.  Recent Results (from the past 2160 hour(s))  POC CREATINE & ALBUMIN,URINE     Status: Normal   Collection Time: 12/26/22 11:58 AM  Result Value Ref Range   Microalbumin Ur, POC 30 mg/L   Creatinine, POC 100 mg/dL   Albumin/Creatinine Ratio, Urine, POC <30       Assessment & Plan:   Problem List Items Addressed This Visit     Type 2 diabetes, controlled, with peripheral neuropathy (HCC)   Relevant Medications   atorvastatin (LIPITOR) 10 MG tablet   FARXIGA 10 MG TABS tablet   Other Relevant Orders   CBC With Differential   CMP14+EGFR   Hemoglobin A1c   Morbid obesity (HCC)   Relevant Medications   FARXIGA 10 MG TABS tablet   Other Relevant Orders   CBC With Differential   CMP14+EGFR   High triglycerides   Relevant Medications   atorvastatin (LIPITOR) 10 MG tablet   VASCEPA 1 g capsule   Other Relevant Orders   Lipid panel   CBC With Differential   CMP14+EGFR   Other Visit Diagnoses     Vitamin D deficiency, unspecified    -  Primary   Relevant Orders   VITAMIN D 25 Hydroxy (Vit-D Deficiency, Fractures)   CBC With Differential   CMP14+EGFR   Shortness of breath       Relevant Medications   albuterol (VENTOLIN HFA) 108 (90 Base) MCG/ACT inhaler   Other Relevant Orders   CBC With Differential   CMP14+EGFR   B12 deficiency due to diet       Relevant Orders   CBC With  Differential   CMP14+EGFR   Vitamin B12       Return in about 4 months (around 05/20/2023) for F/U.   Total time spent: 30 minutes  Mechele Claude, FNP  01/18/2023

## 2023-01-19 LAB — CBC WITH DIFFERENTIAL
Basophils Absolute: 0 10*3/uL (ref 0.0–0.2)
Basos: 0 %
EOS (ABSOLUTE): 0.4 10*3/uL (ref 0.0–0.4)
Eos: 6 %
Hematocrit: 46.2 % (ref 37.5–51.0)
Hemoglobin: 15.1 g/dL (ref 13.0–17.7)
Immature Grans (Abs): 0 10*3/uL (ref 0.0–0.1)
Immature Granulocytes: 0 %
Lymphocytes Absolute: 2 10*3/uL (ref 0.7–3.1)
Lymphs: 29 %
MCH: 26.6 pg (ref 26.6–33.0)
MCHC: 32.7 g/dL (ref 31.5–35.7)
MCV: 81 fL (ref 79–97)
Monocytes Absolute: 0.6 10*3/uL (ref 0.1–0.9)
Monocytes: 8 %
Neutrophils Absolute: 3.9 10*3/uL (ref 1.4–7.0)
Neutrophils: 57 %
RBC: 5.68 x10E6/uL (ref 4.14–5.80)
RDW: 14.5 % (ref 11.6–15.4)
WBC: 6.9 10*3/uL (ref 3.4–10.8)

## 2023-01-19 LAB — CMP14+EGFR
ALT: 42 IU/L (ref 0–44)
AST: 34 IU/L (ref 0–40)
Albumin/Globulin Ratio: 1.3 (ref 1.2–2.2)
Albumin: 4.4 g/dL (ref 3.8–4.9)
Alkaline Phosphatase: 98 IU/L (ref 44–121)
BUN/Creatinine Ratio: 11 (ref 9–20)
BUN: 13 mg/dL (ref 6–24)
Bilirubin Total: 0.4 mg/dL (ref 0.0–1.2)
CO2: 21 mmol/L (ref 20–29)
Calcium: 9.8 mg/dL (ref 8.7–10.2)
Chloride: 100 mmol/L (ref 96–106)
Creatinine, Ser: 1.14 mg/dL (ref 0.76–1.27)
Globulin, Total: 3.5 g/dL (ref 1.5–4.5)
Glucose: 114 mg/dL — ABNORMAL HIGH (ref 70–99)
Potassium: 4.7 mmol/L (ref 3.5–5.2)
Sodium: 140 mmol/L (ref 134–144)
Total Protein: 7.9 g/dL (ref 6.0–8.5)
eGFR: 76 mL/min/{1.73_m2} (ref >59)

## 2023-01-19 LAB — VITAMIN D 25 HYDROXY (VIT D DEFICIENCY, FRACTURES): Vit D, 25-Hydroxy: 60.9 ng/mL (ref 30.0–100.0)

## 2023-01-19 LAB — LIPID PANEL
Chol/HDL Ratio: 5.6 ratio — ABNORMAL HIGH (ref 0.0–5.0)
Cholesterol, Total: 139 mg/dL (ref 100–199)
HDL: 25 mg/dL — ABNORMAL LOW (ref >39)
LDL Chol Calc (NIH): 91 mg/dL (ref 0–99)
Triglycerides: 129 mg/dL (ref 0–149)
VLDL Cholesterol Cal: 23 mg/dL (ref 5–40)

## 2023-01-19 LAB — VITAMIN B12: Vitamin B-12: 1102 pg/mL (ref 232–1245)

## 2023-01-19 LAB — HEMOGLOBIN A1C
Est. average glucose Bld gHb Est-mCnc: 151 mg/dL
Hgb A1c MFr Bld: 6.9 % — ABNORMAL HIGH (ref 4.8–5.6)

## 2023-01-25 ENCOUNTER — Encounter: Payer: Self-pay | Admitting: Family

## 2023-05-12 ENCOUNTER — Other Ambulatory Visit: Payer: Self-pay | Admitting: Family

## 2023-05-12 DIAGNOSIS — I10 Essential (primary) hypertension: Secondary | ICD-10-CM

## 2023-05-22 ENCOUNTER — Encounter: Payer: Self-pay | Admitting: Family

## 2023-05-22 ENCOUNTER — Ambulatory Visit: Payer: Medicaid Other | Admitting: Family

## 2023-05-22 VITALS — BP 132/80 | HR 70 | Ht 65.0 in | Wt 232.0 lb

## 2023-05-22 DIAGNOSIS — E559 Vitamin D deficiency, unspecified: Secondary | ICD-10-CM

## 2023-05-22 DIAGNOSIS — I10 Essential (primary) hypertension: Secondary | ICD-10-CM

## 2023-05-22 DIAGNOSIS — E781 Pure hyperglyceridemia: Secondary | ICD-10-CM

## 2023-05-22 DIAGNOSIS — E786 Lipoprotein deficiency: Secondary | ICD-10-CM

## 2023-05-22 DIAGNOSIS — E1142 Type 2 diabetes mellitus with diabetic polyneuropathy: Secondary | ICD-10-CM | POA: Diagnosis not present

## 2023-05-22 DIAGNOSIS — E538 Deficiency of other specified B group vitamins: Secondary | ICD-10-CM

## 2023-05-22 LAB — POCT CBG (FASTING - GLUCOSE)-MANUAL ENTRY: Glucose Fasting, POC: 115 mg/dL — AB (ref 70–99)

## 2023-05-22 MED ORDER — HYDROCHLOROTHIAZIDE 12.5 MG PO TABS: 12.5000 mg | ORAL_TABLET | Freq: Every day | ORAL | 1 refills | Status: DC | PRN

## 2023-05-22 MED ORDER — METFORMIN HCL 500 MG PO TABS: 500.0000 mg | ORAL_TABLET | Freq: Every day | ORAL | 3 refills | Status: DC

## 2023-05-22 MED ORDER — GABAPENTIN 300 MG PO CAPS: 300.0000 mg | ORAL_CAPSULE | Freq: Every day | ORAL | 1 refills | Status: DC

## 2023-05-22 NOTE — Progress Notes (Signed)
Established Patient Office Visit  Subjective:  Patient ID: Nathaniel Webb, male    DOB: 17-Oct-1966  Age: 57 y.o. MRN: [id number]  Chief Complaint  Patient presents with   Follow-up    4 Month Follow up    Patient is here today for his 3 months follow up.  He has been feeling well since last appointment.   He does not have additional concerns to discuss today.  Labs are due today. He needs refills.   I have reviewed his active problem list, medication list, allergies, health maintenance, notes from last encounter, lab results for his appointment today.    No other concerns at this time.   Past Medical History:  Diagnosis Date   Acute pulmonary embolus (HCC) 11/12/2019   Acute respiratory failure (HCC) 11/09/2019   Acute respiratory failure with hypoxemia (HCC) 11/11/2019   ARDS (adult respiratory distress syndrome) (HCC)    Chest tube in place    COVID-19 11/10/2019   Diabetes mellitus without complication (HCC)    Empyema lung (HCC)    Empyema of pleural space (HCC) 12/18/2019   Hand cramps 11/09/2016   Heel pain 06/09/2015   High triglycerides 05/09/2016   History of COVID-19 pneumonia 11/10/2019   Hydropneumothorax    Hypertension    Low HDL (under 40) 05/09/2016   MSSA bacteremia 11/25/2019   Obesity, Class II, BMI 35-39.9, with comorbidity (HCC) 01/04/2016   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 OR;  Service: Thoracic;  Laterality: Right;    Social History   Socioeconomic History   Marital status: Married    Spouse name: Not on file   Number of children: Not on file   Years of education: Not on file   Highest education level: Not on file  Occupational History   Not on file  Tobacco Use   Smoking status: Former   Smokeless tobacco: Never  Vaping  Use   Vaping Use: Never used  Substance and Sexual Activity   Alcohol use: No    Alcohol/week: 0.0 standard drinks of alcohol   Drug use: No   Sexual activity: Yes    Partners: Female    Birth control/protection: None  Other Topics Concern   Not on file  Social History Narrative   Not on file   Social Determinants of Health   Financial Resource Strain: Not on file  Food Insecurity: Not on file  Transportation Needs: Not on file  Physical Activity: Not on file  Stress: Not on file  Social Connections: Not on file  Intimate Partner Violence: Not on file    Family History  Problem Relation Age of Onset   Diabetes Father    COPD Mother     No Known Allergies  Review of Systems  Musculoskeletal:  Positive for joint pain (in feet).  All other systems reviewed and are negative.      Objective:   BP 132/80   Pulse 70   Ht 5\' 5"  (1.651 m)   Wt 232 lb (105.2 kg)   SpO2 95%   BMI 38.61 kg/m   Vitals:   05/22/23 0902  BP: 132/80  Pulse: 70  Height: 5\' 5"  (1.651 m)  Weight: 232 lb (105.2 kg)  SpO2: 95%  BMI (Calculated): 38.61    Physical Exam Vitals and nursing note reviewed.  Constitutional:      Appearance: Normal appearance. He is obese.  Eyes:     Extraocular Movements: Extraocular movements intact.     Conjunctiva/sclera: Conjunctivae normal.     Pupils: Pupils are equal, round, and reactive to light.  Cardiovascular:     Rate and Rhythm: Normal rate and regular rhythm.     Pulses: Normal pulses.     Heart sounds: Normal heart sounds.  Pulmonary:     Effort: Pulmonary effort is normal.     Breath sounds: Normal breath sounds.  Neurological:     General: No focal deficit present.     Mental Status: He is alert and oriented to person, place, and time. Mental status is at baseline.  Psychiatric:        Mood and Affect: Mood normal.        Behavior: Behavior normal.        Thought Content: Thought content normal.        Judgment: Judgment normal.       Results for orders placed or performed in visit on 05/22/23  POCT CBG (Fasting - Glucose)  Result Value Ref Range   Glucose Fasting, POC 115 (A) 70 - 99 mg/dL    Recent Results (from the past 2160 hour(s))  POCT CBG (Fasting - Glucose)     Status: Abnormal   Collection Time: 05/22/23  9:06 AM  Result Value Ref Range   Glucose Fasting, POC 115 (A) 70 - 99 mg/dL       Assessment & Plan:   Problem List Items Addressed This Visit       Active Problems   Type 2 diabetes, controlled, with peripheral neuropathy (HCC) - Primary    Checking labs today. Will call pt. With results  Continue current diabetes POC, as patient has been well controlled on current regimen.  Will adjust meds if needed based on labs.       Relevant Medications   metFORMIN (GLUCOPHAGE) 500 MG tablet   gabapentin (NEURONTIN) 300 MG capsule   Other Relevant Orders   POCT CBG (Fasting - Glucose) (Completed)   CBC With Differential   CMP14+EGFR   Hemoglobin A1c   Hypertension    Blood pressure well controlled with current medications.  Continue current therapy.  Will reassess at follow up.       Relevant Medications   hydrochlorothiazide (HYDRODIURIL) 12.5 MG tablet   Other Relevant Orders   CBC With Differential   CMP14+EGFR   Morbid obesity (HCC)    Continue current meds.  Will adjust as needed based on results.  The patient is asked to make an attempt to improve diet and exercise patterns to aid in medical management of this problem. Addressed importance of increasing and maintaining water intake.       Relevant Medications   metFORMIN (GLUCOPHAGE) 500 MG tablet   Other Relevant Orders   CBC With Differential   CMP14+EGFR   Low HDL (under 40)   Relevant Orders   Lipid panel   CBC With Differential   CMP14+EGFR   High triglycerides    Checking labs today.  Continue current therapy for lipid control. Will modify as needed based on labwork results.       Relevant Medications    hydrochlorothiazide (HYDRODIURIL) 12.5 MG tablet   Other Relevant Orders   Lipid panel   CBC With Differential   CMP14+EGFR   Vitamin D deficiency, unspecified    Checking labs today.  Will continue supplements as needed.  Relevant Orders   VITAMIN D 25 Hydroxy (Vit-D Deficiency, Fractures)   CBC With Differential   CMP14+EGFR   B12 deficiency due to diet    Checking labs today.  Will continue supplements as needed.        Relevant Orders   CBC With Differential   CMP14+EGFR   Vitamin B12    Return in about 3 months (around 08/22/2023).   Total time spent: 30 minutes  Miki Kins, FNP  05/22/2023   This document may have been prepared by [facility] Voice Recognition software and as such may include unintentional dictation errors.

## 2023-05-22 NOTE — Assessment & Plan Note (Signed)
Checking labs today.  Continue current therapy for lipid control. Will modify as needed based on labwork results.  

## 2023-05-22 NOTE — Assessment & Plan Note (Signed)
Continue current meds.  Will adjust as needed based on results.  The patient is asked to make an attempt to improve diet and exercise patterns to aid in medical management of this problem. Addressed importance of increasing and maintaining water intake.   

## 2023-05-22 NOTE — Assessment & Plan Note (Signed)
Checking labs today.  Will continue supplements as needed.  

## 2023-05-22 NOTE — Assessment & Plan Note (Signed)
Checking labs today. Will call pt. With results  Continue current diabetes POC, as patient has been well controlled on current regimen.  Will adjust meds if needed based on labs.  

## 2023-05-22 NOTE — Assessment & Plan Note (Signed)
Blood pressure well controlled with current medications.  Continue current therapy.  Will reassess at follow up.  

## 2023-05-23 ENCOUNTER — Telehealth: Payer: Self-pay

## 2023-05-23 LAB — CBC WITH DIFFERENTIAL
Basophils Absolute: 0.1 10*3/uL (ref 0.0–0.2)
Basos: 1 %
EOS (ABSOLUTE): 0.2 10*3/uL (ref 0.0–0.4)
Eos: 3 %
Hematocrit: 42.7 % (ref 37.5–51.0)
Hemoglobin: 14.3 g/dL (ref 13.0–17.7)
Immature Grans (Abs): 0 10*3/uL (ref 0.0–0.1)
Immature Granulocytes: 0 %
Lymphocytes Absolute: 1.7 10*3/uL (ref 0.7–3.1)
Lymphs: 23 %
MCH: 26.7 pg (ref 26.6–33.0)
MCHC: 33.5 g/dL (ref 31.5–35.7)
MCV: 80 fL (ref 79–97)
Monocytes Absolute: 0.6 10*3/uL (ref 0.1–0.9)
Monocytes: 8 %
Neutrophils Absolute: 4.9 10*3/uL (ref 1.4–7.0)
Neutrophils: 65 %
RBC: 5.35 x10E6/uL (ref 4.14–5.80)
RDW: 14.2 % (ref 11.6–15.4)
WBC: 7.5 10*3/uL (ref 3.4–10.8)

## 2023-05-23 LAB — CMP14+EGFR
ALT: 41 IU/L (ref 0–44)
AST: 43 IU/L — ABNORMAL HIGH (ref 0–40)
Albumin: 4.5 g/dL (ref 3.8–4.9)
Alkaline Phosphatase: 91 IU/L (ref 44–121)
BUN/Creatinine Ratio: 15 (ref 9–20)
BUN: 16 mg/dL (ref 6–24)
Bilirubin Total: 0.5 mg/dL (ref 0.0–1.2)
CO2: 22 mmol/L (ref 20–29)
Calcium: 9.8 mg/dL (ref 8.7–10.2)
Chloride: 99 mmol/L (ref 96–106)
Creatinine, Ser: 1.1 mg/dL (ref 0.76–1.27)
Globulin, Total: 3.3 g/dL (ref 1.5–4.5)
Glucose: 108 mg/dL — ABNORMAL HIGH (ref 70–99)
Potassium: 4.4 mmol/L (ref 3.5–5.2)
Sodium: 138 mmol/L (ref 134–144)
Total Protein: 7.8 g/dL (ref 6.0–8.5)
eGFR: 79 mL/min/{1.73_m2} (ref >59)

## 2023-05-23 LAB — LIPID PANEL
Chol/HDL Ratio: 4.1 ratio (ref 0.0–5.0)
Cholesterol, Total: 111 mg/dL (ref 100–199)
HDL: 27 mg/dL — ABNORMAL LOW (ref >39)
LDL Chol Calc (NIH): 64 mg/dL (ref 0–99)
Triglycerides: 104 mg/dL (ref 0–149)
VLDL Cholesterol Cal: 20 mg/dL (ref 5–40)

## 2023-05-23 LAB — HEMOGLOBIN A1C
Est. average glucose Bld gHb Est-mCnc: 143 mg/dL
Hgb A1c MFr Bld: 6.6 % — ABNORMAL HIGH (ref 4.8–5.6)

## 2023-05-23 LAB — VITAMIN B12: Vitamin B-12: 889 pg/mL (ref 232–1245)

## 2023-05-23 LAB — VITAMIN D 25 HYDROXY (VIT D DEFICIENCY, FRACTURES): Vit D, 25-Hydroxy: 58 ng/mL (ref 30.0–100.0)

## 2023-07-03 ENCOUNTER — Other Ambulatory Visit: Payer: Self-pay | Admitting: Family

## 2023-07-03 DIAGNOSIS — Z1211 Encounter for screening for malignant neoplasm of colon: Secondary | ICD-10-CM

## 2023-07-05 NOTE — Telephone Encounter (Signed)
Completed by Hassell Halim.

## 2023-07-23 ENCOUNTER — Encounter: Payer: Self-pay | Admitting: *Deleted

## 2023-08-02 ENCOUNTER — Other Ambulatory Visit: Payer: Self-pay | Admitting: Family

## 2023-08-02 DIAGNOSIS — E781 Pure hyperglyceridemia: Secondary | ICD-10-CM

## 2023-08-06 ENCOUNTER — Other Ambulatory Visit: Payer: Self-pay | Admitting: Family

## 2023-08-06 DIAGNOSIS — E1142 Type 2 diabetes mellitus with diabetic polyneuropathy: Secondary | ICD-10-CM

## 2023-08-06 DIAGNOSIS — I10 Essential (primary) hypertension: Secondary | ICD-10-CM

## 2023-08-06 DIAGNOSIS — E781 Pure hyperglyceridemia: Secondary | ICD-10-CM

## 2023-08-06 DIAGNOSIS — Z1211 Encounter for screening for malignant neoplasm of colon: Secondary | ICD-10-CM

## 2023-08-06 MED ORDER — ERGOCALCIFEROL 1.25 MG (50000 UT) PO CAPS: 50000.0000 [IU] | ORAL_CAPSULE | ORAL | 1 refills | Status: DC

## 2023-08-06 MED ORDER — BUDESONIDE-FORMOTEROL FUMARATE 160-4.5 MCG/ACT IN AERO: 2.0000 | INHALATION_SPRAY | Freq: Two times a day (BID) | RESPIRATORY_TRACT | 5 refills | Status: DC

## 2023-08-06 MED ORDER — METFORMIN HCL 500 MG PO TABS
500.0000 mg | ORAL_TABLET | Freq: Every day | ORAL | 3 refills | Status: DC
Start: 2023-08-06 — End: 2023-11-24

## 2023-08-06 MED ORDER — HYDROCHLOROTHIAZIDE 12.5 MG PO TABS: 12.5000 mg | ORAL_TABLET | Freq: Every day | ORAL | 1 refills | Status: DC | PRN

## 2023-08-06 MED ORDER — VASCEPA 1 G PO CAPS: 2.0000 g | ORAL_CAPSULE | Freq: Two times a day (BID) | ORAL | 5 refills | Status: DC

## 2023-08-06 MED ORDER — LISINOPRIL 20 MG PO TABS
20.0000 mg | ORAL_TABLET | Freq: Every day | ORAL | 1 refills | Status: DC
Start: 2023-08-06 — End: 2023-11-24

## 2023-08-06 MED ORDER — FARXIGA 10 MG PO TABS: 10.0000 mg | ORAL_TABLET | Freq: Every day | ORAL | 3 refills | Status: DC

## 2023-08-06 MED ORDER — ATORVASTATIN CALCIUM 10 MG PO TABS
10.0000 mg | ORAL_TABLET | Freq: Every day | ORAL | 3 refills | Status: DC
Start: 2023-08-06 — End: 2024-08-21

## 2023-08-06 MED ORDER — GABAPENTIN 300 MG PO CAPS: 300.0000 mg | ORAL_CAPSULE | Freq: Every day | ORAL | 1 refills | Status: DC

## 2023-08-07 ENCOUNTER — Other Ambulatory Visit: Payer: Self-pay

## 2023-08-24 ENCOUNTER — Ambulatory Visit: Payer: Medicaid Other | Admitting: Family

## 2023-08-24 ENCOUNTER — Encounter: Payer: Self-pay | Admitting: Family

## 2023-08-24 VITALS — BP 138/72 | HR 60 | Ht 65.0 in | Wt 229.0 lb

## 2023-08-24 DIAGNOSIS — E1142 Type 2 diabetes mellitus with diabetic polyneuropathy: Secondary | ICD-10-CM | POA: Diagnosis not present

## 2023-08-24 DIAGNOSIS — E559 Vitamin D deficiency, unspecified: Secondary | ICD-10-CM

## 2023-08-24 DIAGNOSIS — Z23 Encounter for immunization: Secondary | ICD-10-CM

## 2023-08-24 DIAGNOSIS — Z1159 Encounter for screening for other viral diseases: Secondary | ICD-10-CM

## 2023-08-24 DIAGNOSIS — I10 Essential (primary) hypertension: Secondary | ICD-10-CM | POA: Diagnosis not present

## 2023-08-24 DIAGNOSIS — E781 Pure hyperglyceridemia: Secondary | ICD-10-CM

## 2023-08-24 DIAGNOSIS — E538 Deficiency of other specified B group vitamins: Secondary | ICD-10-CM

## 2023-08-24 LAB — POCT CBG (FASTING - GLUCOSE)-MANUAL ENTRY: Glucose Fasting, POC: 105 mg/dL — AB (ref 70–99)

## 2023-08-25 LAB — CMP14+EGFR
ALT: 19 [IU]/L (ref 0–44)
AST: 22 [IU]/L (ref 0–40)
Albumin: 4.3 g/dL (ref 3.8–4.9)
Alkaline Phosphatase: 94 [IU]/L (ref 44–121)
BUN/Creatinine Ratio: 13 (ref 9–20)
BUN: 14 mg/dL (ref 6–24)
Bilirubin Total: 0.5 mg/dL (ref 0.0–1.2)
CO2: 25 mmol/L (ref 20–29)
Calcium: 9.7 mg/dL (ref 8.7–10.2)
Chloride: 100 mmol/L (ref 96–106)
Creatinine, Ser: 1.09 mg/dL (ref 0.76–1.27)
Globulin, Total: 3.4 g/dL (ref 1.5–4.5)
Glucose: 103 mg/dL — ABNORMAL HIGH (ref 70–99)
Potassium: 4.7 mmol/L (ref 3.5–5.2)
Sodium: 139 mmol/L (ref 134–144)
Total Protein: 7.7 g/dL (ref 6.0–8.5)
eGFR: 80 mL/min/{1.73_m2} (ref >59)

## 2023-08-25 LAB — CBC WITH DIFFERENTIAL/PLATELET
Basophils Absolute: 0.1 10*3/uL (ref 0.0–0.2)
Basos: 1 %
EOS (ABSOLUTE): 0.3 10*3/uL (ref 0.0–0.4)
Eos: 4 %
Hematocrit: 46.1 % (ref 37.5–51.0)
Hemoglobin: 14.8 g/dL (ref 13.0–17.7)
Immature Grans (Abs): 0 10*3/uL (ref 0.0–0.1)
Immature Granulocytes: 0 %
Lymphocytes Absolute: 1.6 10*3/uL (ref 0.7–3.1)
Lymphs: 22 %
MCH: 26.7 pg (ref 26.6–33.0)
MCHC: 32.1 g/dL (ref 31.5–35.7)
MCV: 83 fL (ref 79–97)
Monocytes Absolute: 0.6 10*3/uL (ref 0.1–0.9)
Monocytes: 8 %
Neutrophils Absolute: 4.7 10*3/uL (ref 1.4–7.0)
Neutrophils: 65 %
Platelets: 246 10*3/uL (ref 150–450)
RBC: 5.55 x10E6/uL (ref 4.14–5.80)
RDW: 14.1 % (ref 11.6–15.4)
WBC: 7.3 10*3/uL (ref 3.4–10.8)

## 2023-08-25 LAB — LIPID PANEL
Chol/HDL Ratio: 5.6 {ratio} — ABNORMAL HIGH (ref 0.0–5.0)
Cholesterol, Total: 150 mg/dL (ref 100–199)
HDL: 27 mg/dL — ABNORMAL LOW (ref >39)
LDL Chol Calc (NIH): 102 mg/dL — ABNORMAL HIGH (ref 0–99)
Triglycerides: 111 mg/dL (ref 0–149)
VLDL Cholesterol Cal: 21 mg/dL (ref 5–40)

## 2023-08-25 LAB — TSH: TSH: 0.917 u[IU]/mL (ref 0.450–4.500)

## 2023-08-25 LAB — HEMOGLOBIN A1C
Est. average glucose Bld gHb Est-mCnc: 137 mg/dL
Hgb A1c MFr Bld: 6.4 % — ABNORMAL HIGH (ref 4.8–5.6)

## 2023-08-25 LAB — HCV AB W REFLEX TO QUANT PCR: HCV Ab: NONREACTIVE

## 2023-08-25 LAB — HCV INTERPRETATION

## 2023-08-25 LAB — VITAMIN D 25 HYDROXY (VIT D DEFICIENCY, FRACTURES): Vit D, 25-Hydroxy: 62.7 ng/mL (ref 30.0–100.0)

## 2023-08-25 LAB — VITAMIN B12: Vitamin B-12: 852 pg/mL (ref 232–1245)

## 2023-08-27 ENCOUNTER — Other Ambulatory Visit: Payer: Self-pay

## 2023-08-27 DIAGNOSIS — Z23 Encounter for immunization: Secondary | ICD-10-CM

## 2023-08-27 NOTE — Assessment & Plan Note (Signed)
Continue current meds.  Will adjust as needed based on results.  The patient is asked to make an attempt to improve diet and exercise patterns to aid in medical management of this problem. Addressed importance of increasing and maintaining water intake.   

## 2023-08-27 NOTE — Assessment & Plan Note (Signed)
Checking labs today.  Continue current therapy for lipid control. Will modify as needed based on labwork results.  

## 2023-08-27 NOTE — Assessment & Plan Note (Signed)
Checking labs today. Will call pt. With results  Continue current diabetes POC, as patient has been well controlled on current regimen.  Will adjust meds if needed based on labs.

## 2023-08-27 NOTE — Assessment & Plan Note (Signed)
Checking labs today.  Will continue supplements as needed.

## 2023-08-27 NOTE — Assessment & Plan Note (Signed)
Blood pressure well controlled with current medications.  Continue current therapy.  Will reassess at follow up.

## 2023-08-27 NOTE — Assessment & Plan Note (Signed)
Checking labs today.  Will continue supplements as needed.  

## 2023-08-30 ENCOUNTER — Telehealth: Payer: Self-pay

## 2023-08-30 MED ORDER — FEXOFENADINE HCL 180 MG PO TABS: 180.0000 mg | ORAL_TABLET | Freq: Every day | ORAL | 1 refills | Status: DC

## 2023-09-02 LAB — COLOGUARD: COLOGUARD: NEGATIVE

## 2023-09-16 ENCOUNTER — Encounter: Payer: Self-pay | Admitting: Family

## 2023-09-18 ENCOUNTER — Other Ambulatory Visit: Payer: Self-pay | Admitting: Family

## 2023-11-23 ENCOUNTER — Encounter: Payer: Self-pay | Admitting: Family

## 2023-11-23 ENCOUNTER — Ambulatory Visit: Payer: Medicaid Other | Admitting: Family

## 2023-11-23 VITALS — BP 140/88 | HR 65 | Ht 65.0 in | Wt 231.4 lb

## 2023-11-23 DIAGNOSIS — E1142 Type 2 diabetes mellitus with diabetic polyneuropathy: Secondary | ICD-10-CM

## 2023-11-23 DIAGNOSIS — E559 Vitamin D deficiency, unspecified: Secondary | ICD-10-CM | POA: Diagnosis not present

## 2023-11-23 DIAGNOSIS — I10 Essential (primary) hypertension: Secondary | ICD-10-CM | POA: Diagnosis not present

## 2023-11-23 DIAGNOSIS — E538 Deficiency of other specified B group vitamins: Secondary | ICD-10-CM

## 2023-11-23 DIAGNOSIS — E781 Pure hyperglyceridemia: Secondary | ICD-10-CM

## 2023-11-23 MED ORDER — CETIRIZINE HCL 10 MG PO TABS: 10.0000 mg | ORAL_TABLET | Freq: Every day | ORAL | 2 refills | Status: DC

## 2023-11-23 MED ORDER — FLUTICASONE PROPIONATE 50 MCG/ACT NA SUSP: 2.0000 | Freq: Every day | NASAL | 2 refills | Status: DC

## 2023-11-23 NOTE — Patient Instructions (Addendum)
 Go to the [employer] store Look for [employer].  Once downloaded, it will ask you for an organization.   Add "[place]" as the organization.  Login is: CONN TROMBETTA is: [id number]  You will have to change as soon as you log in

## 2023-11-23 NOTE — Progress Notes (Signed)
 Established Patient Office Visit  Subjective:  Patient ID: Nathaniel Webb, male    DOB: May 11, 1966  Age: 58 y.o. MRN: [id number]  Chief Complaint  Patient presents with   Follow-up    3 month follow up    Patient is here today for his 3 months follow up.  He has been feeling fairly well since last appointment.   He does have additional concerns to discuss today.  He continues to have issues with his sinuses. Says that he thinks he had an allergic reaction to the fexofenadine .  When he took it he got a rash and then he stopped taking it for a few days.  After the rash resolved he took it again and the rash reappeared worse than before. Asks if we can go back to his previous allergy medication.  Labs are due today. He needs refills.   I have reviewed his active problem list, medication list, allergies, notes from last encounter, lab results for his appointment today.      No other concerns at this time.   Past Medical History:  Diagnosis Date   Acute pulmonary embolus (HCC) 11/12/2019   Acute respiratory failure (HCC) 11/09/2019   Acute respiratory failure with hypoxemia (HCC) 11/11/2019   ARDS (adult respiratory distress syndrome) (HCC)    Chest tube in place    COVID-19 11/10/2019   Diabetes mellitus without complication (HCC)    Empyema lung (HCC)    Empyema of pleural space (HCC) 12/18/2019   Hand cramps 11/09/2016   Heel pain 06/09/2015   High triglycerides 05/09/2016   History of COVID-19 pneumonia 11/10/2019   Hydropneumothorax    Hypertension    Low HDL (under 40) 05/09/2016   MSSA bacteremia 11/25/2019   Obesity, Class II, BMI 35-39.9, with comorbidity (HCC) 01/04/2016   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    Past Surgical History:  Procedure Laterality Date   VIDEO ASSISTED THORACOSCOPY (VATS)/EMPYEMA Right 12/18/2019   Procedure: VIDEO ASSISTED THORACOSCOPY (VATS)/EMPYEMA;  Surgeon: Fleeta Hanford Coy, MD;  Location: [facility] OR;  Service: Thoracic;  Laterality: Right;     Social History   Socioeconomic History   Marital status: Married    Spouse name: Not on file   Number of children: Not on file   Years of education: Not on file   Highest education level: Not on file  Occupational History   Not on file  Tobacco Use   Smoking status: Former   Smokeless tobacco: Never  Vaping Use   Vaping status: Never Used  Substance and Sexual Activity   Alcohol  use: No    Alcohol /week: 0.0 standard drinks of alcohol    Drug use: No   Sexual activity: Yes    Partners: Female    Birth control/protection: None  Other Topics Concern   Not on file  Social History Narrative   Not on file   Social Drivers of Health   Financial Resource Strain: Not on file  Food Insecurity: Not on file  Transportation Needs: Not on file  Physical Activity: Not on file  Stress: Not on file  Social Connections: Not on file  Intimate Partner Violence: Not on file    Family History  Problem Relation Age of Onset   Diabetes Father    COPD Mother     Allergies  Allergen Reactions   Fexofenadine  Rash    Review of Systems  HENT:  Positive for congestion and sinus pain.   All other systems reviewed and are negative.  Objective:   BP (!) 140/88   Pulse 65   Ht 5' 5 (1.651 m)   Wt 231 lb 6.4 oz (105 kg)   SpO2 95%   BMI 38.51 kg/m   Vitals:   11/23/23 0914  BP: (!) 140/88  Pulse: 65  Height: 5' 5 (1.651 m)  Weight: 231 lb 6.4 oz (105 kg)  SpO2: 95%  BMI (Calculated): 38.51    Physical Exam Vitals and nursing note reviewed.  Constitutional:      Appearance: Normal appearance. He is normal weight.  Eyes:     Pupils: Pupils are equal, round, and reactive to light.  Cardiovascular:     Rate and Rhythm: Normal rate and regular rhythm.     Pulses: Normal pulses.     Heart sounds: Normal heart sounds.  Pulmonary:     Effort: Pulmonary effort is normal.     Breath sounds: Normal breath sounds.  Neurological:     General: No focal deficit  present.     Mental Status: He is alert.  Psychiatric:        Mood and Affect: Mood normal.        Behavior: Behavior normal.        Thought Content: Thought content normal.        Judgment: Judgment normal.      Results for orders placed or performed in visit on 11/23/23  Lipid panel  Result Value Ref Range   Cholesterol, Total 145 100 - 199 mg/dL   Triglycerides 809 (H) 0 - 149 mg/dL   HDL 26 (L) >60 mg/dL   VLDL Cholesterol Cal 33 5 - 40 mg/dL   LDL Chol Calc (NIH) 86 0 - 99 mg/dL   Chol/HDL Ratio 5.6 (H) 0.0 - 5.0 ratio  VITAMIN D  25 Hydroxy (Vit-D Deficiency, Fractures)  Result Value Ref Range   Vit D, 25-Hydroxy 57.5 30.0 - 100.0 ng/mL  CMP14+EGFR  Result Value Ref Range   Glucose 97 70 - 99 mg/dL   BUN 13 6 - 24 mg/dL   Creatinine, Ser 8.86 0.76 - 1.27 mg/dL   eGFR 76 >40 fO/fpw/8.26   BUN/Creatinine Ratio 12 9 - 20   Sodium 141 134 - 144 mmol/L   Potassium 5.0 3.5 - 5.2 mmol/L   Chloride 102 96 - 106 mmol/L   CO2 25 20 - 29 mmol/L   Calcium  9.7 8.7 - 10.2 mg/dL   Total Protein 7.7 6.0 - 8.5 g/dL   Albumin 4.3 3.8 - 4.9 g/dL   Globulin, Total 3.4 1.5 - 4.5 g/dL   Bilirubin Total 0.4 0.0 - 1.2 mg/dL   Alkaline Phosphatase 90 44 - 121 IU/L   AST 27 0 - 40 IU/L   ALT 26 0 - 44 IU/L  Hemoglobin A1c  Result Value Ref Range   Hgb A1c MFr Bld 6.7 (H) 4.8 - 5.6 %   Est. average glucose Bld gHb Est-mCnc 146 mg/dL  Vitamin B12  Result Value Ref Range   Vitamin B-12 912 232 - 1,245 pg/mL  CBC with Diff  Result Value Ref Range   WBC 7.7 3.4 - 10.8 x10E3/uL   RBC 5.71 4.14 - 5.80 x10E6/uL   Hemoglobin 14.8 13.0 - 17.7 g/dL   Hematocrit 52.6 62.4 - 51.0 %   MCV 83 79 - 97 fL   MCH 25.9 (L) 26.6 - 33.0 pg   MCHC 31.3 (L) 31.5 - 35.7 g/dL   RDW 86.1 88.3 - 84.5 %   Platelets 268  150 - 450 x10E3/uL   Neutrophils 60 Not Estab. %   Lymphs 25 Not Estab. %   Monocytes 9 Not Estab. %   Eos 5 Not Estab. %   Basos 1 Not Estab. %   Neutrophils Absolute 4.6 1.4 - 7.0  x10E3/uL   Lymphocytes Absolute 1.9 0.7 - 3.1 x10E3/uL   Monocytes Absolute 0.7 0.1 - 0.9 x10E3/uL   EOS (ABSOLUTE) 0.4 0.0 - 0.4 x10E3/uL   Basophils Absolute 0.1 0.0 - 0.2 x10E3/uL   Immature Granulocytes 0 Not Estab. %   Immature Grans (Abs) 0.0 0.0 - 0.1 x10E3/uL    Recent Results (from the past 2160 hours)  Lipid panel     Status: Abnormal   Collection Time: 11/23/23 10:14 AM  Result Value Ref Range   Cholesterol, Total 145 100 - 199 mg/dL   Triglycerides 809 (H) 0 - 149 mg/dL   HDL 26 (L) >60 mg/dL   VLDL Cholesterol Cal 33 5 - 40 mg/dL   LDL Chol Calc (NIH) 86 0 - 99 mg/dL   Chol/HDL Ratio 5.6 (H) 0.0 - 5.0 ratio    Comment:                                   T. Chol/HDL Ratio                                             Men  Women                               1/2 Avg.Risk  3.4    3.3                                   Avg.Risk  5.0    4.4                                2X Avg.Risk  9.6    7.1                                3X Avg.Risk 23.4   11.0   VITAMIN D  25 Hydroxy (Vit-D Deficiency, Fractures)     Status: None   Collection Time: 11/23/23 10:14 AM  Result Value Ref Range   Vit D, 25-Hydroxy 57.5 30.0 - 100.0 ng/mL    Comment: Vitamin D  deficiency has been defined by the Institute of Medicine and an Endocrine Society practice guideline as a level of serum 25-OH vitamin D  less than 20 ng/mL (1,2). The Endocrine Society went on to further define vitamin D  insufficiency as a level between 21 and 29 ng/mL (2). 1. IOM (Institute of Medicine). 2010. Dietary reference    intakes for calcium  and D. [place]: The    [employer]. 2. Holick MF, Binkley [place], Bischoff-Ferrari HA, et al.    Evaluation, treatment, and prevention of vitamin D     deficiency: an Endocrine Society clinical practice    guideline. JCEM. 2011 Jul; 96(7):1911-30.   CMP14+EGFR     Status: None   Collection Time: 11/23/23 10:14  AM  Result Value Ref Range   Glucose 97 70 - 99 mg/dL    BUN 13 6 - 24 mg/dL   Creatinine, Ser 8.86 0.76 - 1.27 mg/dL   eGFR 76 >40 fO/fpw/8.26   BUN/Creatinine Ratio 12 9 - 20   Sodium 141 134 - 144 mmol/L   Potassium 5.0 3.5 - 5.2 mmol/L   Chloride 102 96 - 106 mmol/L   CO2 25 20 - 29 mmol/L   Calcium  9.7 8.7 - 10.2 mg/dL   Total Protein 7.7 6.0 - 8.5 g/dL   Albumin 4.3 3.8 - 4.9 g/dL   Globulin, Total 3.4 1.5 - 4.5 g/dL   Bilirubin Total 0.4 0.0 - 1.2 mg/dL   Alkaline Phosphatase 90 44 - 121 IU/L   AST 27 0 - 40 IU/L   ALT 26 0 - 44 IU/L  Hemoglobin A1c     Status: Abnormal   Collection Time: 11/23/23 10:14 AM  Result Value Ref Range   Hgb A1c MFr Bld 6.7 (H) 4.8 - 5.6 %    Comment:          Prediabetes: 5.7 - 6.4          Diabetes: >6.4          Glycemic control for adults with diabetes: <7.0    Est. average glucose Bld gHb Est-mCnc 146 mg/dL  Vitamin B12     Status: None   Collection Time: 11/23/23 10:14 AM  Result Value Ref Range   Vitamin B-12 912 232 - 1,245 pg/mL  CBC with Diff     Status: Abnormal   Collection Time: 11/23/23 10:14 AM  Result Value Ref Range   WBC 7.7 3.4 - 10.8 x10E3/uL   RBC 5.71 4.14 - 5.80 x10E6/uL   Hemoglobin 14.8 13.0 - 17.7 g/dL   Hematocrit 52.6 62.4 - 51.0 %   MCV 83 79 - 97 fL   MCH 25.9 (L) 26.6 - 33.0 pg   MCHC 31.3 (L) 31.5 - 35.7 g/dL   RDW 86.1 88.3 - 84.5 %   Platelets 268 150 - 450 x10E3/uL   Neutrophils 60 Not Estab. %   Lymphs 25 Not Estab. %   Monocytes 9 Not Estab. %   Eos 5 Not Estab. %   Basos 1 Not Estab. %   Neutrophils Absolute 4.6 1.4 - 7.0 x10E3/uL   Lymphocytes Absolute 1.9 0.7 - 3.1 x10E3/uL   Monocytes Absolute 0.7 0.1 - 0.9 x10E3/uL   EOS (ABSOLUTE) 0.4 0.0 - 0.4 x10E3/uL   Basophils Absolute 0.1 0.0 - 0.2 x10E3/uL   Immature Granulocytes 0 Not Estab. %   Immature Grans (Abs) 0.0 0.0 - 0.1 x10E3/uL       Assessment & Plan:   Problem List Items Addressed This Visit       Cardiovascular and Mediastinum   Essential hypertension   Blood pressure well  controlled with current medications.  Continue current therapy.  Will reassess at follow up.       Relevant Medications   hydrochlorothiazide  (HYDRODIURIL ) 12.5 MG tablet   lisinopril  (ZESTRIL ) 20 MG tablet   VASCEPA  1 g capsule   sildenafil  (VIAGRA ) 50 MG tablet   Other Relevant Orders   CMP14+EGFR (Completed)   CBC with Diff (Completed)     Endocrine   Type 2 diabetes, controlled, with peripheral neuropathy (HCC) - Primary   Checking labs today. Will call pt. With results  Continue current diabetes POC, as patient has been well controlled on current regimen.  Will adjust meds if needed based on labs.       Relevant Medications   FARXIGA  10 MG TABS tablet   lisinopril  (ZESTRIL ) 20 MG tablet   metFORMIN  (GLUCOPHAGE ) 500 MG tablet   Other Relevant Orders   CMP14+EGFR (Completed)   Hemoglobin A1c (Completed)   CBC with Diff (Completed)   Ambulatory referral to Ophthalmology     Other   Morbid obesity (HCC)   Continue current meds.  Will adjust as needed based on results.  The patient is asked to make an attempt to improve diet and exercise patterns to aid in medical management of this problem. Addressed importance of increasing and maintaining water  intake.       Relevant Medications   FARXIGA  10 MG TABS tablet   metFORMIN  (GLUCOPHAGE ) 500 MG tablet   High triglycerides   Checking labs today.  Continue current therapy for lipid control. Will modify as needed based on labwork results.       Relevant Medications   hydrochlorothiazide  (HYDRODIURIL ) 12.5 MG tablet   lisinopril  (ZESTRIL ) 20 MG tablet   VASCEPA  1 g capsule   sildenafil  (VIAGRA ) 50 MG tablet   Other Relevant Orders   Lipid panel (Completed)   CMP14+EGFR (Completed)   CBC with Diff (Completed)   Vitamin D  deficiency, unspecified   Relevant Orders   VITAMIN D  25 Hydroxy (Vit-D Deficiency, Fractures) (Completed)   CMP14+EGFR (Completed)   CBC with Diff (Completed)   B12 deficiency due to diet    Checking labs today.  Will continue supplements as needed.        Relevant Orders   CMP14+EGFR (Completed)   Vitamin B12 (Completed)   CBC with Diff (Completed)    Return in about 3 months (around 02/21/2024).   Total time spent: 20 minutes  ALAN CHRISTELLA ARRANT, FNP  11/23/2023   This document may have been prepared by [facility] Voice Recognition software and as such may include unintentional dictation errors.

## 2023-11-24 LAB — CMP14+EGFR
ALT: 26 [IU]/L (ref 0–44)
AST: 27 [IU]/L (ref 0–40)
Albumin: 4.3 g/dL (ref 3.8–4.9)
Alkaline Phosphatase: 90 [IU]/L (ref 44–121)
BUN/Creatinine Ratio: 12 (ref 9–20)
BUN: 13 mg/dL (ref 6–24)
Bilirubin Total: 0.4 mg/dL (ref 0.0–1.2)
CO2: 25 mmol/L (ref 20–29)
Calcium: 9.7 mg/dL (ref 8.7–10.2)
Chloride: 102 mmol/L (ref 96–106)
Creatinine, Ser: 1.13 mg/dL (ref 0.76–1.27)
Globulin, Total: 3.4 g/dL (ref 1.5–4.5)
Glucose: 97 mg/dL (ref 70–99)
Potassium: 5 mmol/L (ref 3.5–5.2)
Sodium: 141 mmol/L (ref 134–144)
Total Protein: 7.7 g/dL (ref 6.0–8.5)
eGFR: 76 mL/min/{1.73_m2} (ref >59)

## 2023-11-24 LAB — CBC WITH DIFFERENTIAL/PLATELET
Basophils Absolute: 0.1 10*3/uL (ref 0.0–0.2)
Basos: 1 %
EOS (ABSOLUTE): 0.4 10*3/uL (ref 0.0–0.4)
Eos: 5 %
Hematocrit: 47.3 % (ref 37.5–51.0)
Hemoglobin: 14.8 g/dL (ref 13.0–17.7)
Immature Grans (Abs): 0 10*3/uL (ref 0.0–0.1)
Immature Granulocytes: 0 %
Lymphocytes Absolute: 1.9 10*3/uL (ref 0.7–3.1)
Lymphs: 25 %
MCH: 25.9 pg — ABNORMAL LOW (ref 26.6–33.0)
MCHC: 31.3 g/dL — ABNORMAL LOW (ref 31.5–35.7)
MCV: 83 fL (ref 79–97)
Monocytes Absolute: 0.7 10*3/uL (ref 0.1–0.9)
Monocytes: 9 %
Neutrophils Absolute: 4.6 10*3/uL (ref 1.4–7.0)
Neutrophils: 60 %
Platelets: 268 10*3/uL (ref 150–450)
RBC: 5.71 x10E6/uL (ref 4.14–5.80)
RDW: 13.8 % (ref 11.6–15.4)
WBC: 7.7 10*3/uL (ref 3.4–10.8)

## 2023-11-24 LAB — HEMOGLOBIN A1C
Est. average glucose Bld gHb Est-mCnc: 146 mg/dL
Hgb A1c MFr Bld: 6.7 % — ABNORMAL HIGH (ref 4.8–5.6)

## 2023-11-24 LAB — VITAMIN D 25 HYDROXY (VIT D DEFICIENCY, FRACTURES): Vit D, 25-Hydroxy: 57.5 ng/mL (ref 30.0–100.0)

## 2023-11-24 LAB — LIPID PANEL
Chol/HDL Ratio: 5.6 {ratio} — ABNORMAL HIGH (ref 0.0–5.0)
Cholesterol, Total: 145 mg/dL (ref 100–199)
HDL: 26 mg/dL — ABNORMAL LOW (ref >39)
LDL Chol Calc (NIH): 86 mg/dL (ref 0–99)
Triglycerides: 190 mg/dL — ABNORMAL HIGH (ref 0–149)
VLDL Cholesterol Cal: 33 mg/dL (ref 5–40)

## 2023-11-24 LAB — VITAMIN B12: Vitamin B-12: 912 pg/mL (ref 232–1245)

## 2023-11-24 MED ORDER — SILDENAFIL CITRATE 50 MG PO TABS: 50.0000 mg | ORAL_TABLET | ORAL | 0 refills | Status: DC | PRN

## 2023-11-24 MED ORDER — LISINOPRIL 20 MG PO TABS: 20.0000 mg | ORAL_TABLET | Freq: Every day | ORAL | 1 refills | Status: DC

## 2023-11-24 MED ORDER — ADVAIR HFA 230-21 MCG/ACT IN AERO: 2.0000 | INHALATION_SPRAY | Freq: Two times a day (BID) | RESPIRATORY_TRACT | 5 refills | Status: DC

## 2023-11-24 MED ORDER — FLUTICASONE-SALMETEROL 230-21 MCG/ACT IN AERO: 2.0000 | INHALATION_SPRAY | Freq: Two times a day (BID) | RESPIRATORY_TRACT | 5 refills | Status: DC

## 2023-11-24 MED ORDER — ERGOCALCIFEROL 1.25 MG (50000 UT) PO CAPS: 50000.0000 [IU] | ORAL_CAPSULE | ORAL | 1 refills | Status: DC

## 2023-11-24 MED ORDER — VASCEPA 1 G PO CAPS: 2.0000 g | ORAL_CAPSULE | Freq: Two times a day (BID) | ORAL | 5 refills | Status: DC

## 2023-11-24 MED ORDER — METFORMIN HCL 500 MG PO TABS: 500.0000 mg | ORAL_TABLET | Freq: Every day | ORAL | 3 refills | Status: DC

## 2023-11-24 MED ORDER — FARXIGA 10 MG PO TABS: 10.0000 mg | ORAL_TABLET | Freq: Every day | ORAL | 3 refills | Status: DC

## 2023-11-24 MED ORDER — HYDROCHLOROTHIAZIDE 12.5 MG PO TABS: 12.5000 mg | ORAL_TABLET | Freq: Every day | ORAL | 1 refills | Status: DC | PRN

## 2023-11-24 NOTE — Assessment & Plan Note (Signed)
 Checking labs today.  Continue current therapy for lipid control. Will modify as needed based on labwork results.

## 2023-11-24 NOTE — Assessment & Plan Note (Signed)
 Continue current meds.  Will adjust as needed based on results.  The patient is asked to make an attempt to improve diet and exercise patterns to aid in medical management of this problem. Addressed importance of increasing and maintaining water intake.

## 2023-11-24 NOTE — Assessment & Plan Note (Signed)
 Checking labs today.  Will continue supplements as needed.

## 2023-11-24 NOTE — Assessment & Plan Note (Signed)
 Blood pressure well controlled with current medications.  Continue current therapy.  Will reassess at follow up.

## 2023-11-24 NOTE — Assessment & Plan Note (Signed)
 Checking labs today. Will call pt. With results  Continue current diabetes POC, as patient has been well controlled on current regimen.  Will adjust meds if needed based on labs.

## 2023-12-03 NOTE — Progress Notes (Signed)
Patient Notified via MyChart Messgae

## 2023-12-13 ENCOUNTER — Telehealth: Payer: Self-pay | Admitting: Family

## 2023-12-13 DIAGNOSIS — L2089 Other atopic dermatitis: Secondary | ICD-10-CM

## 2023-12-13 NOTE — Telephone Encounter (Signed)
Patient's wife left a VM requesting a call back.  Tried to call Margaretha Glassing back and it states the call cannot be completed as dialed. Will try again later.

## 2023-12-14 NOTE — Telephone Encounter (Signed)
Patient's wife left another VM requesting a derm referral.

## 2024-02-04 ENCOUNTER — Other Ambulatory Visit: Payer: Self-pay | Admitting: Family

## 2024-02-18 ENCOUNTER — Other Ambulatory Visit: Payer: Self-pay | Admitting: Family

## 2024-02-22 ENCOUNTER — Other Ambulatory Visit: Payer: Self-pay

## 2024-02-22 ENCOUNTER — Encounter: Payer: Self-pay | Admitting: Family

## 2024-02-22 ENCOUNTER — Ambulatory Visit: Payer: Medicaid Other | Admitting: Family

## 2024-02-22 VITALS — BP 122/84 | HR 80 | Ht 65.0 in | Wt 239.6 lb

## 2024-02-22 DIAGNOSIS — E559 Vitamin D deficiency, unspecified: Secondary | ICD-10-CM

## 2024-02-22 DIAGNOSIS — E781 Pure hyperglyceridemia: Secondary | ICD-10-CM | POA: Diagnosis not present

## 2024-02-22 DIAGNOSIS — E538 Deficiency of other specified B group vitamins: Secondary | ICD-10-CM

## 2024-02-22 DIAGNOSIS — E1142 Type 2 diabetes mellitus with diabetic polyneuropathy: Secondary | ICD-10-CM

## 2024-02-22 DIAGNOSIS — R1011 Right upper quadrant pain: Secondary | ICD-10-CM

## 2024-02-22 DIAGNOSIS — I10 Essential (primary) hypertension: Secondary | ICD-10-CM | POA: Diagnosis not present

## 2024-02-22 DIAGNOSIS — G8929 Other chronic pain: Secondary | ICD-10-CM

## 2024-02-22 DIAGNOSIS — R0602 Shortness of breath: Secondary | ICD-10-CM

## 2024-02-22 DIAGNOSIS — M25512 Pain in left shoulder: Secondary | ICD-10-CM

## 2024-02-22 DIAGNOSIS — Z8709 Personal history of other diseases of the respiratory system: Secondary | ICD-10-CM

## 2024-02-22 DIAGNOSIS — E786 Lipoprotein deficiency: Secondary | ICD-10-CM

## 2024-02-22 LAB — POC CREATINE & ALBUMIN,URINE
Albumin/Creatinine Ratio, Urine, POC: <30
Creatinine, POC: 100 mg/dL
Microalbumin Ur, POC: 30 mg/L

## 2024-02-22 NOTE — Patient Instructions (Signed)
 Setting up multiple tests - they will call you to schedule them.

## 2024-02-22 NOTE — Progress Notes (Signed)
 Established Patient Office Visit  Subjective:  Patient ID: Nathaniel Webb, male    DOB: [birth year]  Age: 58 y.o. MRN: [id number]  Chief Complaint  Patient presents with   Follow-up    3 month follow up    Patient is here today for his 3 months follow up.  He has been feeling fairly well since last appointment.   He does have additional concerns to discuss today.  1. Has been having pain in his left shoulder.  He says that several years ago he had a dislocation injury from this joint.  He had it put back in place, and it hadn't been bothering him until lately.  The pain will be sharp, and will last for a few seconds, during which he sometimes has weakness and difficulty with range of motion in the arm.  2. Has been also experiencing RUQ abdominal pain that comes and goes.  He says it is worst after eating particular meals.  3. Some shortness of breath that has been going on for the last several weeks.  Given his history with pulmonology issues, he is very concerned.   Labs are due today. He needs refills.   I have reviewed his active problem list, medication list, allergies, health maintenance, notes from last encounter, lab results for his appointment today.      No other concerns at this time.   Past Medical History:  Diagnosis Date   Acute pulmonary embolus (HCC) 11/12/2019   Acute respiratory failure (HCC) 11/09/2019   Acute respiratory failure with hypoxemia (HCC) 11/11/2019   ARDS (adult respiratory distress syndrome) (HCC)    Chest tube in place    COVID-19 11/10/2019   Diabetes mellitus without complication (HCC)    Empyema lung (HCC)    Empyema of pleural space (HCC) 12/18/2019   Hand cramps 11/09/2016   Heel pain 06/09/2015   High triglycerides 05/09/2016   History of COVID-19 pneumonia 11/10/2019   Hydropneumothorax    Hypertension    Low HDL (under 40) 05/09/2016   MSSA bacteremia 11/25/2019   Obesity, Class II, BMI 35-39.9, with comorbidity (HCC)  01/04/2016   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    Past Surgical History:  Procedure Laterality Date   VIDEO ASSISTED THORACOSCOPY (VATS)/EMPYEMA Right 12/18/2019   Procedure: VIDEO ASSISTED THORACOSCOPY (VATS)/EMPYEMA;  Surgeon: Kerin Perna, MD;  Location: [facility] OR;  Service: Thoracic;  Laterality: Right;    Social History   Socioeconomic History   Marital status: Married    Spouse name: Not on file   Number of children: Not on file   Years of education: Not on file   Highest education level: Not on file  Occupational History   Not on file  Tobacco Use   Smoking status: Former   Smokeless tobacco: Never  Vaping Use   Vaping status: Never Used  Substance and Sexual Activity   Alcohol use: No    Alcohol/week: 0.0 standard drinks of alcohol   Drug use: No   Sexual activity: Yes    Partners: Female    Birth control/protection: None  Other Topics Concern   Not on file  Social History Narrative   Not on file   Social Drivers of Health   Financial Resource Strain: Not on file  Food Insecurity: Not on file  Transportation Needs: Not on file  Physical Activity: Not on file  Stress: Not on file  Social Connections: Not on file  Intimate Partner Violence: Not on file    Family History  Problem Relation Age of Onset   Diabetes Father    COPD Mother     Allergies  Allergen Reactions   Fexofenadine Rash    Review of Systems  Respiratory:  Positive for shortness of breath and wheezing.   Gastrointestinal:  Positive for abdominal pain.  Musculoskeletal:  Positive for joint pain.  All other systems reviewed and are negative.      Objective:   BP 122/84   Pulse 80   Ht 5\' 5"  (1.651 m)   Wt 239 lb 9.6 oz (108.7 kg)   SpO2 95%   BMI 39.87 kg/m   Vitals:   02/22/24 0909  BP: 122/84  Pulse: 80  Height: 5\' 5"  (1.651 m)  Weight: 239 lb 9.6 oz (108.7 kg)  SpO2: 95%  BMI (Calculated): 39.87    Physical Exam Vitals and nursing note reviewed.  Constitutional:       Appearance: Normal appearance. He is normal weight.  Eyes:     Pupils: Pupils are equal, round, and reactive to light.  Cardiovascular:     Rate and Rhythm: Normal rate and regular rhythm.     Pulses: Normal pulses.     Heart sounds: Normal heart sounds.  Pulmonary:     Effort: Pulmonary effort is normal.     Breath sounds: Wheezing present.  Abdominal:     Tenderness: There is abdominal tenderness in the right upper quadrant.  Neurological:     Mental Status: He is alert.  Psychiatric:        Mood and Affect: Mood normal.        Behavior: Behavior normal.      Results for orders placed or performed in visit on 02/22/24  POC CREATINE & ALBUMIN,URINE  Result Value Ref Range   Microalbumin Ur, POC 30 mg/L   Creatinine, POC 100 mg/dL   Albumin/Creatinine Ratio, Urine, POC <30     Recent Results (from the past 2160 hours)  POC CREATINE & ALBUMIN,URINE     Status: None   Collection Time: 02/22/24 10:30 AM  Result Value Ref Range   Microalbumin Ur, POC 30 mg/L   Creatinine, POC 100 mg/dL   Albumin/Creatinine Ratio, Urine, POC <30        Assessment & Plan:   Problem List Items Addressed This Visit       Cardiovascular and Mediastinum   Essential hypertension   Blood pressure well controlled with current medications.  Continue current therapy.  Will reassess at follow up.        Relevant Orders   CMP14+EGFR   TSH   CBC with Diff     Endocrine   Type 2 diabetes, controlled, with peripheral neuropathy (HCC) - Primary   Checking labs today. Will call pt. With results  Continue current diabetes POC, as patient has been well controlled on current regimen.  Will adjust meds if needed based on labs.       Relevant Orders   POC CREATINE & ALBUMIN,URINE (Completed)   CMP14+EGFR   TSH   Hemoglobin A1c   CBC with Diff     Other   Morbid obesity (HCC)   Continue current meds.  Will adjust as needed based on results.  The patient is asked to make an attempt to  improve diet and exercise patterns to aid in medical management of this problem. Addressed importance of increasing and maintaining water intake.        Relevant Orders   CMP14+EGFR   TSH   CBC  with Diff   Low HDL (under 40)   Checking labs today.  Continue current therapy for lipid control. Will modify as needed based on labwork results.        Relevant Orders   Lipid panel   CMP14+EGFR   TSH   CBC with Diff   High triglycerides   Checking labs today.  Continue current therapy for lipid control. Will modify as needed based on labwork results.        Relevant Orders   Lipid panel   CMP14+EGFR   TSH   CBC with Diff   Vitamin D deficiency, unspecified   Checking labs today.  Will continue supplements as needed.        Relevant Orders   VITAMIN D 25 Hydroxy (Vit-D Deficiency, Fractures)   CMP14+EGFR   TSH   CBC with Diff   B12 deficiency due to diet   Checking labs today.  Will continue supplements as needed.        Relevant Orders   CMP14+EGFR   TSH   Vitamin B12   CBC with Diff   Other Visit Diagnoses       History of pneumothorax       Given history of pneumothorax and shortness of breath would like to get a CT of his chest.  Order placed today.  Patient aware they will call to schedule   Relevant Orders   CT CHEST W CONTRAST   CMP14+EGFR   TSH   CBC with Diff     Shortness of breath       Relevant Orders   CT CHEST W CONTRAST   CMP14+EGFR   TSH   CBC with Diff     Colicky RUQ abdominal pain       Right upper quadrant ultrasound ordered today. Patient aware they will call to schedule this appointment. Will discuss results when available   Relevant Orders   US ABDOMEN LIMITED RUQ (LIVER/GB)   CMP14+EGFR   TSH   CBC with Diff     Chronic left shoulder pain       X-ray of the left shoulder ordered today. Patient will have this done when he goes to have his ultrasound and/or CT scans done. Will call with results   Relevant Orders   DG  Shoulder Left   CMP14+EGFR   TSH   CBC with Diff       Return in about 4 months (around 06/23/2024) for F/U.   Total time spent: 20 minutes  Miki Kins, FNP  02/22/2024   This document may have been prepared by [facility] Voice Recognition software and as such may include unintentional dictation errors.

## 2024-02-22 NOTE — Assessment & Plan Note (Signed)
 Continue current meds.  Will adjust as needed based on results.  The patient is asked to make an attempt to improve diet and exercise patterns to aid in medical management of this problem. Addressed importance of increasing and maintaining water intake.

## 2024-02-22 NOTE — Assessment & Plan Note (Signed)
 Checking labs today.  Continue current therapy for lipid control. Will modify as needed based on labwork results.

## 2024-02-22 NOTE — Assessment & Plan Note (Signed)
 Checking labs today. Will call pt. With results  Continue current diabetes POC, as patient has been well controlled on current regimen.  Will adjust meds if needed based on labs.

## 2024-02-22 NOTE — Assessment & Plan Note (Signed)
 Checking labs today.  Will continue supplements as needed.

## 2024-02-22 NOTE — Assessment & Plan Note (Signed)
 Blood pressure well controlled with current medications.  Continue current therapy.  Will reassess at follow up.

## 2024-02-23 LAB — CMP14+EGFR
ALT: 29 IU/L (ref 0–44)
AST: 36 IU/L (ref 0–40)
Albumin: 4.4 g/dL (ref 3.8–4.9)
Alkaline Phosphatase: 83 IU/L (ref 44–121)
BUN/Creatinine Ratio: 11 (ref 9–20)
BUN: 12 mg/dL (ref 6–24)
Bilirubin Total: 0.5 mg/dL (ref 0.0–1.2)
CO2: 22 mmol/L (ref 20–29)
Calcium: 9.3 mg/dL (ref 8.7–10.2)
Chloride: 101 mmol/L (ref 96–106)
Creatinine, Ser: 1.06 mg/dL (ref 0.76–1.27)
Globulin, Total: 3 g/dL (ref 1.5–4.5)
Glucose: 103 mg/dL — ABNORMAL HIGH (ref 70–99)
Potassium: 4.1 mmol/L (ref 3.5–5.2)
Sodium: 140 mmol/L (ref 134–144)
Total Protein: 7.4 g/dL (ref 6.0–8.5)
eGFR: 82 mL/min/{1.73_m2} (ref >59)

## 2024-02-23 LAB — VITAMIN D 25 HYDROXY (VIT D DEFICIENCY, FRACTURES): Vit D, 25-Hydroxy: 60.3 ng/mL (ref 30.0–100.0)

## 2024-02-23 LAB — CBC WITH DIFFERENTIAL/PLATELET
Basophils Absolute: 0 10*3/uL (ref 0.0–0.2)
Basos: 1 %
EOS (ABSOLUTE): 0.4 10*3/uL (ref 0.0–0.4)
Eos: 6 %
Hematocrit: 43.5 % (ref 37.5–51.0)
Hemoglobin: 14.3 g/dL (ref 13.0–17.7)
Immature Grans (Abs): 0 10*3/uL (ref 0.0–0.1)
Immature Granulocytes: 0 %
Lymphocytes Absolute: 2 10*3/uL (ref 0.7–3.1)
Lymphs: 25 %
MCH: 26.7 pg (ref 26.6–33.0)
MCHC: 32.9 g/dL (ref 31.5–35.7)
MCV: 81 fL (ref 79–97)
Monocytes Absolute: 0.7 10*3/uL (ref 0.1–0.9)
Monocytes: 9 %
Neutrophils Absolute: 4.6 10*3/uL (ref 1.4–7.0)
Neutrophils: 59 %
Platelets: 259 10*3/uL (ref 150–450)
RBC: 5.36 x10E6/uL (ref 4.14–5.80)
RDW: 13.9 % (ref 11.6–15.4)
WBC: 7.8 10*3/uL (ref 3.4–10.8)

## 2024-02-23 LAB — HEMOGLOBIN A1C
Est. average glucose Bld gHb Est-mCnc: 140 mg/dL
Hgb A1c MFr Bld: 6.5 % — ABNORMAL HIGH (ref 4.8–5.6)

## 2024-02-23 LAB — LIPID PANEL
Chol/HDL Ratio: 4.8 ratio (ref 0.0–5.0)
Cholesterol, Total: 129 mg/dL (ref 100–199)
HDL: 27 mg/dL — ABNORMAL LOW (ref >39)
LDL Chol Calc (NIH): 80 mg/dL (ref 0–99)
Triglycerides: 123 mg/dL (ref 0–149)
VLDL Cholesterol Cal: 22 mg/dL (ref 5–40)

## 2024-02-23 LAB — TSH: TSH: 0.914 u[IU]/mL (ref 0.450–4.500)

## 2024-02-23 LAB — VITAMIN B12: Vitamin B-12: 873 pg/mL (ref 232–1245)

## 2024-02-25 ENCOUNTER — Other Ambulatory Visit: Payer: Self-pay | Admitting: Family

## 2024-03-04 ENCOUNTER — Ambulatory Visit
Admission: RE | Admit: 2024-03-04 | Discharge: 2024-03-04 | Disposition: A | Discharge status: Home / Self Care | Source: Ambulatory Visit | Attending: Family | Admitting: Family

## 2024-03-04 DIAGNOSIS — R1011 Right upper quadrant pain: Secondary | ICD-10-CM

## 2024-03-04 DIAGNOSIS — R0602 Shortness of breath: Secondary | ICD-10-CM

## 2024-03-04 DIAGNOSIS — Z8709 Personal history of other diseases of the respiratory system: Secondary | ICD-10-CM

## 2024-03-04 MED ORDER — IOPAMIDOL (ISOVUE-370) INJECTION 76%
75.0000 mL | Freq: Once | INTRAVENOUS | Status: AC | PRN
  Administered 2024-03-04: 75 mL via INTRAVENOUS

## 2024-03-17 ENCOUNTER — Ambulatory Visit: Admitting: Family Medicine

## 2024-04-23 ENCOUNTER — Other Ambulatory Visit: Payer: Self-pay | Admitting: Family

## 2024-04-23 DIAGNOSIS — I10 Essential (primary) hypertension: Secondary | ICD-10-CM

## 2024-06-23 ENCOUNTER — Ambulatory Visit: Admitting: Family

## 2024-07-08 ENCOUNTER — Ambulatory Visit: Admitting: Internal Medicine

## 2024-07-18 ENCOUNTER — Other Ambulatory Visit: Payer: Self-pay | Admitting: Family

## 2024-07-18 ENCOUNTER — Telehealth: Payer: Self-pay | Admitting: Family

## 2024-07-18 DIAGNOSIS — I10 Essential (primary) hypertension: Secondary | ICD-10-CM

## 2024-07-18 MED ORDER — LISINOPRIL 20 MG PO TABS: 20.0000 mg | ORAL_TABLET | Freq: Every day | ORAL | 1 refills | Status: DC

## 2024-07-18 NOTE — Telephone Encounter (Signed)
 Pt wife called wanting husbands LISINOPRIL  sent to [facility] on [address], script was already sent to [facility]. Pt stated she would like it sent to [facility] on [address]   Please advise

## 2024-07-29 ENCOUNTER — Encounter: Payer: Self-pay | Admitting: Nurse Practitioner

## 2024-07-29 ENCOUNTER — Ambulatory Visit: Admitting: Nurse Practitioner

## 2024-07-29 VITALS — BP 132/82 | HR 80 | Resp 18 | Ht 63.0 in | Wt 227.0 lb

## 2024-07-29 DIAGNOSIS — M170 Bilateral primary osteoarthritis of knee: Secondary | ICD-10-CM | POA: Diagnosis not present

## 2024-07-29 DIAGNOSIS — E559 Vitamin D deficiency, unspecified: Secondary | ICD-10-CM

## 2024-07-29 DIAGNOSIS — Z7689 Persons encountering health services in other specified circumstances: Secondary | ICD-10-CM

## 2024-07-29 DIAGNOSIS — L723 Sebaceous cyst: Secondary | ICD-10-CM

## 2024-07-29 DIAGNOSIS — I10 Essential (primary) hypertension: Secondary | ICD-10-CM

## 2024-07-29 DIAGNOSIS — E1142 Type 2 diabetes mellitus with diabetic polyneuropathy: Secondary | ICD-10-CM

## 2024-07-29 DIAGNOSIS — Z7984 Long term (current) use of oral hypoglycemic drugs: Secondary | ICD-10-CM

## 2024-07-29 DIAGNOSIS — Z125 Encounter for screening for malignant neoplasm of prostate: Secondary | ICD-10-CM

## 2024-07-29 DIAGNOSIS — G6289 Other specified polyneuropathies: Secondary | ICD-10-CM

## 2024-07-29 DIAGNOSIS — Z23 Encounter for immunization: Secondary | ICD-10-CM

## 2024-07-29 DIAGNOSIS — D509 Iron deficiency anemia, unspecified: Secondary | ICD-10-CM

## 2024-07-29 DIAGNOSIS — E782 Mixed hyperlipidemia: Secondary | ICD-10-CM

## 2024-07-29 MED ORDER — MUPIROCIN 2 % EX OINT: TOPICAL_OINTMENT | CUTANEOUS | 0 refills | Status: AC

## 2024-07-29 NOTE — Progress Notes (Signed)
 BP 132/82   Pulse 80   Resp 18   Ht 5' 3 (1.6 m)   Wt 227 lb (103 kg)   SpO2 95%   BMI 40.21 kg/m    Subjective:    Patient ID: Nathaniel Webb, male    DOB: Oct 20, 1966, 58 y.o.   MRN: [id number]  HPI: Nathaniel Webb is a 58 y.o. male  Chief Complaint  Patient presents with   Establish Care   Hypertension   Diabetes   Hyperlipidemia   Cyst    Middle of back   Discussed the use of AI scribe software for clinical note transcription with the patient, who gave verbal consent to proceed.  History of Present Illness Nathaniel Webb is a 58 year old male who presents to establish care.  Hypertension - Diagnosed hypertension - Sometimes forgets to take blood pressure medication - blood pressure today 132/82  Type 2 diabetes mellitus with peripheral neuropathy - Diagnosed type 2 diabetes mellitus - Peripheral neuropathy present, with numbness in knees - Improvement in neuropathy symptoms with increased exercise - Does not frequently check blood glucose; when checked, readings are around 98 mg/dL - Last hemoglobin J8r was 6.5% in April - Overdue for foot and eye exams  Obesity and hyperlipidemia - Obesity present - Diagnosed hyperlipidemia - Current medications include atorvastatin  10 mg and Vascepa   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 - No current symptoms reported  Cutaneous cyst - Cyst located in the middle of the back, present for approximately two weeks - No drainage from the cyst  Osteoarthritis of the knees - Diagnosed osteoarthritis in both knees - Numbness in knees, improved with increased exercise  Sensory disturbance - Heightened sense of smell, particularly bothersome with strong odors  Dermatologic care - Uses cream and Vaseline for foot dryness, previously prescribed by dermatologist      07/29/2024    9:13 AM 02/22/2024    9:49 AM 02/15/2017    8:33 AM  Depression screen PHQ 2/9  Decreased Interest 0 3 0   Down, Depressed, Hopeless 0 0 0  PHQ - 2 Score 0 3 0  Altered sleeping 0 0   Tired, decreased energy 0 1   Change in appetite 0 0   Feeling bad or failure about yourself  0 0   Trouble concentrating 0 0   Moving slowly or fidgety/restless 0 0   Suicidal thoughts 0 0   PHQ-9 Score 0 4   Difficult doing work/chores Not difficult at all      Relevant past medical, surgical, family and social history reviewed and updated as indicated. Interim medical history since our last visit reviewed. Allergies and medications reviewed and updated.  Review of Systems  Constitutional: Negative for fever or weight change.  Respiratory: Negative for cough and shortness of breath.   Cardiovascular: Negative for chest pain or palpitations.  Gastrointestinal: Negative for abdominal pain, no bowel changes.  Musculoskeletal: Negative for gait problem or joint swelling.  Skin: Negative for rash.  Neurological: Negative for dizziness or headache.  No other specific complaints in a complete review of systems (except as listed in HPI above).      Objective:      BP 132/82   Pulse 80   Resp 18   Ht 5' 3 (1.6 m)   Wt 227 lb (103 kg)   SpO2 95%   BMI 40.21 kg/m    Wt Readings from Last 3 Encounters:  07/29/24 227  lb (103 kg)  02/22/24 239 lb 9.6 oz (108.7 kg)  11/23/23 231 lb 6.4 oz (105 kg)    Physical Exam VITALS: BP- 132/82 MEASUREMENTS: Weight- 227, BMI- 40.21. GENERAL: Alert, cooperative, well developed, no acute distress HEENT: Normocephalic, normal oropharynx, moist mucous membranes CHEST: Clear to auscultation bilaterally, no wheezes, rhonchi, or crackles CARDIOVASCULAR: Normal heart rate and rhythm, S1 and S2 normal without murmurs ABDOMEN: Soft, non-tender, non-distended, without organomegaly, normal bowel sounds EXTREMITIES: No cyanosis or edema NEUROLOGICAL: Cranial nerves grossly intact, moves all extremities without gross motor or sensory deficit SKIN: Cyst on back, not  infected  Results for orders placed or performed in visit on 02/22/24  POC CREATINE & ALBUMIN,URINE   Collection Time: 02/22/24 10:30 AM  Result Value Ref Range   Microalbumin Ur, POC 30 mg/L   Creatinine, POC 100 mg/dL   Albumin/Creatinine Ratio, Urine, POC <30   Lipid panel   Collection Time: 02/22/24 10:34 AM  Result Value Ref Range   Cholesterol, Total 129 100 - 199 mg/dL   Triglycerides 876 0 - 149 mg/dL   HDL 27 (L) >60 mg/dL   VLDL Cholesterol Cal 22 5 - 40 mg/dL   LDL Chol Calc (NIH) 80 0 - 99 mg/dL   Chol/HDL Ratio 4.8 0.0 - 5.0 ratio  VITAMIN D  25 Hydroxy (Vit-D Deficiency, Fractures)   Collection Time: 02/22/24 10:34 AM  Result Value Ref Range   Vit D, 25-Hydroxy 60.3 30.0 - 100.0 ng/mL  CMP14+EGFR   Collection Time: 02/22/24 10:34 AM  Result Value Ref Range   Glucose 103 (H) 70 - 99 mg/dL   BUN 12 6 - 24 mg/dL   Creatinine, Ser 8.93 0.76 - 1.27 mg/dL   eGFR 82 >40 fO/fpw/8.26   BUN/Creatinine Ratio 11 9 - 20   Sodium 140 134 - 144 mmol/L   Potassium 4.1 3.5 - 5.2 mmol/L   Chloride 101 96 - 106 mmol/L   CO2 22 20 - 29 mmol/L   Calcium  9.3 8.7 - 10.2 mg/dL   Total Protein 7.4 6.0 - 8.5 g/dL   Albumin 4.4 3.8 - 4.9 g/dL   Globulin, Total 3.0 1.5 - 4.5 g/dL   Bilirubin Total 0.5 0.0 - 1.2 mg/dL   Alkaline Phosphatase 83 44 - 121 IU/L   AST 36 0 - 40 IU/L   ALT 29 0 - 44 IU/L  TSH   Collection Time: 02/22/24 10:34 AM  Result Value Ref Range   TSH 0.914 0.450 - 4.500 uIU/mL  Hemoglobin A1c   Collection Time: 02/22/24 10:34 AM  Result Value Ref Range   Hgb A1c MFr Bld 6.5 (H) 4.8 - 5.6 %   Est. average glucose Bld gHb Est-mCnc 140 mg/dL  Vitamin A87   Collection Time: 02/22/24 10:34 AM  Result Value Ref Range   Vitamin B-12 873 232 - 1,245 pg/mL  CBC with Diff   Collection Time: 02/22/24 10:34 AM  Result Value Ref Range   WBC 7.8 3.4 - 10.8 x10E3/uL   RBC 5.36 4.14 - 5.80 x10E6/uL   Hemoglobin 14.3 13.0 - 17.7 g/dL   Hematocrit 56.4 62.4 - 51.0 %    MCV 81 79 - 97 fL   MCH 26.7 26.6 - 33.0 pg   MCHC 32.9 31.5 - 35.7 g/dL   RDW 86.0 88.3 - 84.5 %   Platelets 259 150 - 450 x10E3/uL   Neutrophils 59 Not Estab. %   Lymphs 25 Not Estab. %   Monocytes 9 Not Estab. %  Eos 6 Not Estab. %   Basos 1 Not Estab. %   Neutrophils Absolute 4.6 1.4 - 7.0 x10E3/uL   Lymphocytes Absolute 2.0 0.7 - 3.1 x10E3/uL   Monocytes Absolute 0.7 0.1 - 0.9 x10E3/uL   EOS (ABSOLUTE) 0.4 0.0 - 0.4 x10E3/uL   Basophils Absolute 0.0 0.0 - 0.2 x10E3/uL   Immature Granulocytes 0 Not Estab. %   Immature Grans (Abs) 0.0 0.0 - 0.1 x10E3/uL          Assessment & Plan:   Problem List Items Addressed This Visit       Cardiovascular and Mediastinum   Essential hypertension - Primary   Relevant Orders   CBC with Differential/Platelet   Comprehensive metabolic panel with GFR     Endocrine   Type 2 diabetes, controlled, with peripheral neuropathy (HCC)   Relevant Orders   Hemoglobin A1c     Nervous and Auditory   Peripheral neuropathy     Musculoskeletal and Integument   Osteoarthritis of both knees     Other   Morbid obesity (HCC)   Relevant Orders   TSH   Vitamin D  deficiency, unspecified   Relevant Orders   VITAMIN D  25 Hydroxy (Vit-D Deficiency, Fractures)   Mixed hyperlipidemia   Relevant Orders   Lipid panel   Other Visit Diagnoses       Encounter to establish care         Screening for prostate cancer       Relevant Orders   PSA     Iron deficiency anemia, unspecified iron deficiency anemia type       Relevant Orders   Iron, TIBC and Ferritin Panel     Sebaceous cyst       Relevant Medications   mupirocin  ointment (BACTROBAN ) 2 %     Immunization due       Relevant Orders   Flu vaccine trivalent PF, 6mos and older(Flulaval,Afluria,Fluarix,Fluzone) (Completed)        Assessment and Plan Assessment & Plan Type 2 diabetes mellitus with peripheral neuropathy Type 2 diabetes is well-controlled with an A1c of 6.5%. Peripheral  neuropathy is present, with dryness and darkening of the feet. He does not frequently check blood glucose but reports he is not elevated. - Order A1c to assess current diabetes control - Continue metformin  500 mg daily - Continue Farxiga  10 mg daily - Apply prescribed cream to feet for dryness and darkening  Hypertension Hypertension is well-controlled with a blood pressure of 132/82 mmHg. He is adherent to lisinopril . - Continue lisinopril  20 mg daily  Hyperlipidemia Hyperlipidemia is managed with atorvastatin  and Vascepa . Last LDL was 80 mg/dL, indicating good control. - Continue atorvastatin  10 mg daily - Continue Vascepa  - Order lipid panel to reassess cholesterol levels  Obesity Obesity with a weight of 227 pounds and BMI of 40.21. - Encourage continuation of lifestyle modifications, including dietary management and regular exercise. -continue to increase physical activity, getting at least 150 min of physical activity a week.  Work on including [employer], broadcasting/film/video 2 days a week.  - continue eating at a calorie deficit 1800-2000 cal a day, eating a well balanced diet with whole foods, avoiding processed foods.   Patient is motivated to continue working on lifestyle modification.    Osteoarthritis of both knees Osteoarthritis of both knees with reported improvement due to increased exercise. Occasional tightness noted when walking.  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 No cardiology follow-up has been  done.  Back cyst Cyst located in the middle of the back, present for about two weeks. No signs of infection or drainage. - Prescribe mupirocin  ointment for application to the cyst        Follow up plan: Return in about 4 months (around 11/28/2024) for follow up.

## 2024-07-30 ENCOUNTER — Ambulatory Visit: Payer: Self-pay | Admitting: Nurse Practitioner

## 2024-07-30 LAB — CBC WITH DIFFERENTIAL/PLATELET
Absolute Lymphocytes: 1812 {cells}/uL (ref 850–3900)
Absolute Monocytes: 623 {cells}/uL (ref 200–950)
Basophils Absolute: 49 {cells}/uL (ref 0–200)
Basophils Relative: 0.6 %
Eosinophils Absolute: 312 {cells}/uL (ref 15–500)
Eosinophils Relative: 3.8 %
HCT: 46.8 % (ref 38.5–50.0)
Hemoglobin: 14.8 g/dL (ref 13.2–17.1)
MCH: 26.9 pg — ABNORMAL LOW (ref 27.0–33.0)
MCHC: 31.6 g/dL — ABNORMAL LOW (ref 32.0–36.0)
MCV: 84.9 fL (ref 80.0–100.0)
MPV: 11.3 fL (ref 7.5–12.5)
Monocytes Relative: 7.6 %
Neutro Abs: 5404 {cells}/uL (ref 1500–7800)
Neutrophils Relative %: 65.9 %
Platelets: 259 Thousand/uL (ref 140–400)
RBC: 5.51 Million/uL (ref 4.20–5.80)
RDW: 13.8 % (ref 11.0–15.0)
Total Lymphocyte: 22.1 %
WBC: 8.2 Thousand/uL (ref 3.8–10.8)

## 2024-07-30 LAB — COMPREHENSIVE METABOLIC PANEL WITH GFR
AG Ratio: 1.6 (calc) (ref 1.0–2.5)
ALT: 24 U/L (ref 9–46)
AST: 30 U/L (ref 10–35)
Albumin: 4.5 g/dL (ref 3.6–5.1)
Alkaline phosphatase (APISO): 70 U/L (ref 35–144)
BUN: 12 mg/dL (ref 7–25)
CO2: 32 mmol/L (ref 20–32)
Calcium: 9.7 mg/dL (ref 8.6–10.3)
Chloride: 101 mmol/L (ref 98–110)
Creat: 0.97 mg/dL (ref 0.70–1.30)
Globulin: 2.9 g/dL (ref 1.9–3.7)
Glucose, Bld: 103 mg/dL — ABNORMAL HIGH (ref 65–99)
Potassium: 4.7 mmol/L (ref 3.5–5.3)
Sodium: 140 mmol/L (ref 135–146)
Total Bilirubin: 0.7 mg/dL (ref 0.2–1.2)
Total Protein: 7.4 g/dL (ref 6.1–8.1)
eGFR: 92 mL/min/1.73m2 (ref >=60)

## 2024-07-30 LAB — HEMOGLOBIN A1C
Hgb A1c MFr Bld: 6.4 % — ABNORMAL HIGH (ref <5.7)
Mean Plasma Glucose: 137 mg/dL
eAG (mmol/L): 7.6 mmol/L

## 2024-07-30 LAB — PSA: PSA: 2.64 ng/mL (ref <=4.00)

## 2024-07-30 LAB — LIPID PANEL
Cholesterol: 127 mg/dL (ref <200)
HDL: 32 mg/dL — ABNORMAL LOW (ref >=40)
LDL Cholesterol (Calc): 75 mg/dL
Non-HDL Cholesterol (Calc): 95 mg/dL (ref <130)
Total CHOL/HDL Ratio: 4 (calc) (ref <5.0)
Triglycerides: 122 mg/dL (ref <150)

## 2024-07-30 LAB — TSH: TSH: 0.82 m[IU]/L (ref 0.40–4.50)

## 2024-07-30 LAB — IRON,TIBC AND FERRITIN PANEL
%SAT: 30 % (ref 20–48)
Ferritin: 108 ng/mL (ref 38–380)
Iron: 108 ng/mL (ref 50–380)
TIBC: 341 ug/dL (ref 250–425)

## 2024-07-30 LAB — VITAMIN D 25 HYDROXY (VIT D DEFICIENCY, FRACTURES): Vit D, 25-Hydroxy: 85 ng/mL (ref 30–100)

## 2024-08-05 ENCOUNTER — Other Ambulatory Visit: Payer: Self-pay | Admitting: Nurse Practitioner

## 2024-08-05 NOTE — Telephone Encounter (Unsigned)
 Copied from CRM #[id number]. Topic: Clinical - Medication Refill >> Aug 05, 2024  2:04 PM Fonda T wrote: Medication: FARXIGA  10 MG TABS tablet  Has the patient contacted their pharmacy? Yes, advised to contact office   This is the patient's preferred pharmacy:   [id number] - [place], [place] - [address] [place] [address] Phone: [phone] Fax: [phone]  Is this the correct pharmacy for this prescription? Yes If no, delete pharmacy and type the correct one.   Has the prescription been filled recently? Yes  Is the patient out of the medication? Yes  Has the patient been seen for an appointment in the last year OR does the patient have an upcoming appointment? Yes  Can we respond through MyChart? No, prefers phone call at [phone]  Agent: Please be advised that Rx refills may take up to 3 business days. We ask that you follow-up with your pharmacy.

## 2024-08-07 NOTE — Addendum Note (Signed)
 Addended by: GRETTA LAURAINE HERO on: 08/07/2024 07:37 AM   Modules accepted: Orders

## 2024-08-14 ENCOUNTER — Other Ambulatory Visit: Payer: Self-pay | Admitting: Family

## 2024-08-14 DIAGNOSIS — E1142 Type 2 diabetes mellitus with diabetic polyneuropathy: Secondary | ICD-10-CM

## 2024-08-14 DIAGNOSIS — I10 Essential (primary) hypertension: Secondary | ICD-10-CM

## 2024-08-19 ENCOUNTER — Telehealth: Payer: Self-pay

## 2024-08-19 ENCOUNTER — Other Ambulatory Visit: Payer: Self-pay | Admitting: Nurse Practitioner

## 2024-08-19 DIAGNOSIS — E1142 Type 2 diabetes mellitus with diabetic polyneuropathy: Secondary | ICD-10-CM

## 2024-08-19 DIAGNOSIS — I10 Essential (primary) hypertension: Secondary | ICD-10-CM

## 2024-08-19 NOTE — Telephone Encounter (Signed)
 Patient wife LM asking for refill for the patient, however the patient has changed care so we are no longer prescribing his prescription for him, he would need to contact them for refills.

## 2024-08-19 NOTE — Telephone Encounter (Unsigned)
 Copied from CRM #[id number]. Topic: Clinical - Medication Refill >> Aug 19, 2024  8:56 AM Wess RAMAN wrote: Medication: metFORMIN  (GLUCOPHAGE ) 500 MG tablet  Vitamin D , Ergocalciferol , (DRISDOL ) 1.25 MG (50000 UNIT) CAPS capsule  FARXIGA  10 MG TABS tablet  lisinopril  (ZESTRIL ) 20 MG tablet   Has the patient contacted their pharmacy? Yes (Agent: If no, request that the patient contact the pharmacy for the refill. If patient does not wish to contact the pharmacy document the reason why and proceed with request.) (Agent: If yes, when and what did the pharmacy advise?) Call doctor  This is the patient's preferred pharmacy:   [id number] - [place], [place] - [address] [place] [address] Phone: [phone] Fax: [phone]  Is this the correct pharmacy for this prescription? Yes If no, delete pharmacy and type the correct one.   Has the prescription been filled recently? Yes  Is the patient out of the medication? Yes  Has the patient been seen for an appointment in the last year OR does the patient have an upcoming appointment? Yes  Can we respond through MyChart? No  Agent: Please be advised that Rx refills may take up to 3 business days. We ask that you follow-up with your pharmacy.

## 2024-08-20 NOTE — Telephone Encounter (Signed)
 Requested medication (s) are due for refill today - no  Requested medication (s) are on the active medication list -yes  Future visit scheduled -yes  Last refill: Farxiga , metformin - 11/24/23- #90 3RF                  Vitamin D - 02/20/24 #12 1RF                 Lisinopril  - 07/18/24 #90 1RF  Notes to clinic: Change in pharmacy for all, unable to forward- outside provider- will need new Rx from PCP  Requested Prescriptions  Pending Prescriptions Disp Refills   metFORMIN  (GLUCOPHAGE ) 500 MG tablet 90 tablet 3    Sig: Take 1 tablet (500 mg total) by mouth daily with breakfast.     Endocrinology:  Diabetes - Biguanides Passed - 08/20/2024  4:15 PM      Passed - Cr in normal range and within 360 days    Creat  Date Value Ref Range Status  07/29/2024 0.97 0.70 - 1.30 mg/dL Final   Creatinine, POC  Date Value Ref Range Status  02/22/2024 100 mg/dL Final   Creatinine, Urine  Date Value Ref Range Status  11/09/2016 130 20 - 370 mg/dL Final         Passed - HBA1C is between 0 and 7.9 and within 180 days    Hemoglobin A1C  Date Value Ref Range Status  09/18/2022 6.5  Final   Hgb A1c MFr Bld  Date Value Ref Range Status  07/29/2024 6.4 (H) <5.7 % Final    Comment:    For someone without known diabetes, a hemoglobin  A1c value between 5.7% and 6.4% is consistent with prediabetes and should be confirmed with a  follow-up test. . For someone with known diabetes, a value <7% indicates that their diabetes is well controlled. A1c targets should be individualized based on duration of diabetes, age, comorbid conditions, and other considerations. . This assay result is consistent with an increased risk of diabetes. . Currently, no consensus exists regarding use of hemoglobin A1c for diagnosis of diabetes for children. .          Passed - eGFR in normal range and within 360 days    GFR, Est African American  Date Value Ref Range Status  11/09/2016 >89 >=60 mL/min Final   GFR  calc Af Amer  Date Value Ref Range Status  01/05/2020 >60 >60 mL/min Final   GFR, Est Non African American  Date Value Ref Range Status  11/09/2016 79 >=60 mL/min Final   GFR calc non Af Amer  Date Value Ref Range Status  01/05/2020 >60 >60 mL/min Final   eGFR  Date Value Ref Range Status  07/29/2024 92 > OR = 60 mL/min/1.38m2 Final  02/22/2024 82 >59 mL/min/1.73 Final         Passed - B12 Level in normal range and within 720 days    Vitamin B-12  Date Value Ref Range Status  02/22/2024 873 232 - 1,245 pg/mL Final         Passed - Valid encounter within last 6 months    Recent Outpatient Visits           3 weeks ago Essential hypertension   [facility], Mliss FALCON, FNP              Passed - CBC within normal limits and completed in the last 12 months    WBC  Date Value Ref  Range Status  07/29/2024 8.2 3.8 - 10.8 Thousand/uL Final   RBC  Date Value Ref Range Status  07/29/2024 5.51 4.20 - 5.80 Million/uL Final   Hemoglobin  Date Value Ref Range Status  07/29/2024 14.8 13.2 - 17.1 g/dL Final  95/88/7974 85.6 13.0 - 17.7 g/dL Final   HCT  Date Value Ref Range Status  07/29/2024 46.8 38.5 - 50.0 % Final   Hematocrit  Date Value Ref Range Status  02/22/2024 43.5 37.5 - 51.0 % Final   MCHC  Date Value Ref Range Status  07/29/2024 31.6 (L) 32.0 - 36.0 g/dL Final    Comment:    For adults, a slight decrease in the calculated MCHC value (in the range of 30 to 32 g/dL) is most likely not clinically significant; however, it should be interpreted with caution in correlation with other red cell parameters and the patient's clinical condition.    [facility]  Date Value Ref Range Status  07/29/2024 26.9 (L) 27.0 - 33.0 pg Final   MCV  Date Value Ref Range Status  07/29/2024 84.9 80.0 - 100.0 fL Final  02/22/2024 81 79 - 97 fL Final   No results found for: PLTCOUNTKUC, LABPLAT, POCPLA RDW  Date Value Ref Range Status   07/29/2024 13.8 11.0 - 15.0 % Final  02/22/2024 13.9 11.6 - 15.4 % Final          Vitamin D , Ergocalciferol , (DRISDOL ) 1.25 MG (50000 UNIT) CAPS capsule 12 capsule 1     Endocrinology:  Vitamins - Vitamin D  Supplementation 2 Failed - 08/20/2024  4:15 PM      Failed - Manual Review: Route requests for 50,000 IU strength to the provider      Passed - Ca in normal range and within 360 days    Calcium   Date Value Ref Range Status  07/29/2024 9.7 8.6 - 10.3 mg/dL Final   Calcium , Ion  Date Value Ref Range Status  12/19/2019 1.12 (L) 1.15 - 1.40 mmol/L Final         Passed - Vitamin D  in normal range and within 360 days    Vit D, 25-Hydroxy  Date Value Ref Range Status  07/29/2024 85 30 - 100 ng/mL Final    Comment:    Vitamin D  Status         25-OH Vitamin D : . Deficiency:                    <20 ng/mL Insufficiency:             20 - 29 ng/mL Optimal:                 > or = 30 ng/mL . For 25-OH Vitamin D  testing on patients on  D2-supplementation and patients for whom quantitation  of D2 and D3 fractions is required, the QuestAssureD(TM) 25-OH VIT D, (D2,D3), LC/MS/MS is recommended: order  code 07111 (patients >33yrs). . See Note 1 . Note 1 . For additional information, please refer to  http://education.Quest[facility]ostics.com/faq/FAQ199  (This link is being provided for informational/ educational purposes only.)          Passed - Valid encounter within last 12 months    Recent Outpatient Visits           3 weeks ago Essential hypertension   [facility] Gareth Mliss FALCON, FNP               FARXIGA  10 MG TABS tablet 90 tablet 3  Sig: Take 1 tablet (10 mg total) by mouth daily.     Endocrinology:  Diabetes - SGLT2 Inhibitors Passed - 08/20/2024  4:15 PM      Passed - Cr in normal range and within 360 days    Creat  Date Value Ref Range Status  07/29/2024 0.97 0.70 - 1.30 mg/dL Final   Creatinine, POC  Date Value Ref Range  Status  02/22/2024 100 mg/dL Final   Creatinine, Urine  Date Value Ref Range Status  11/09/2016 130 20 - 370 mg/dL Final         Passed - HBA1C is between 0 and 7.9 and within 180 days    Hemoglobin A1C  Date Value Ref Range Status  09/18/2022 6.5  Final   Hgb A1c MFr Bld  Date Value Ref Range Status  07/29/2024 6.4 (H) <5.7 % Final    Comment:    For someone without known diabetes, a hemoglobin  A1c value between 5.7% and 6.4% is consistent with prediabetes and should be confirmed with a  follow-up test. . For someone with known diabetes, a value <7% indicates that their diabetes is well controlled. A1c targets should be individualized based on duration of diabetes, age, comorbid conditions, and other considerations. . This assay result is consistent with an increased risk of diabetes. . Currently, no consensus exists regarding use of hemoglobin A1c for diagnosis of diabetes for children. .          Passed - eGFR in normal range and within 360 days    GFR, Est African American  Date Value Ref Range Status  11/09/2016 >89 >=60 mL/min Final   GFR calc Af Amer  Date Value Ref Range Status  01/05/2020 >60 >60 mL/min Final   GFR, Est Non African American  Date Value Ref Range Status  11/09/2016 79 >=60 mL/min Final   GFR calc non Af Amer  Date Value Ref Range Status  01/05/2020 >60 >60 mL/min Final   eGFR  Date Value Ref Range Status  07/29/2024 92 > OR = 60 mL/min/1.40m2 Final  02/22/2024 82 >59 mL/min/1.73 Final         Passed - Valid encounter within last 6 months    Recent Outpatient Visits           3 weeks ago Essential hypertension   [facility] Gareth Mliss FALCON, FNP               lisinopril  (ZESTRIL ) 20 MG tablet 90 tablet 1    Sig: Take 1 tablet (20 mg total) by mouth daily.     Cardiovascular:  ACE Inhibitors Passed - 08/20/2024  4:15 PM      Passed - Cr in normal range and within 180 days    Creat  Date  Value Ref Range Status  07/29/2024 0.97 0.70 - 1.30 mg/dL Final   Creatinine, POC  Date Value Ref Range Status  02/22/2024 100 mg/dL Final   Creatinine, Urine  Date Value Ref Range Status  11/09/2016 130 20 - 370 mg/dL Final         Passed - K in normal range and within 180 days    Potassium  Date Value Ref Range Status  07/29/2024 4.7 3.5 - 5.3 mmol/L Final         Passed - Patient is not pregnant      Passed - Last BP in normal range    BP Readings from Last 1 Encounters:  07/29/24 132/82  Passed - Valid encounter within last 6 months    Recent Outpatient Visits           3 weeks ago Essential hypertension   [facility] Gareth Mliss FALCON, [place]                 Requested Prescriptions  Pending Prescriptions Disp Refills   metFORMIN  (GLUCOPHAGE ) 500 MG tablet 90 tablet 3    Sig: Take 1 tablet (500 mg total) by mouth daily with breakfast.     Endocrinology:  Diabetes - Biguanides Passed - 08/20/2024  4:15 PM      Passed - Cr in normal range and within 360 days    Creat  Date Value Ref Range Status  07/29/2024 0.97 0.70 - 1.30 mg/dL Final   Creatinine, POC  Date Value Ref Range Status  02/22/2024 100 mg/dL Final   Creatinine, Urine  Date Value Ref Range Status  11/09/2016 130 20 - 370 mg/dL Final         Passed - HBA1C is between 0 and 7.9 and within 180 days    Hemoglobin A1C  Date Value Ref Range Status  09/18/2022 6.5  Final   Hgb A1c MFr Bld  Date Value Ref Range Status  07/29/2024 6.4 (H) <5.7 % Final    Comment:    For someone without known diabetes, a hemoglobin  A1c value between 5.7% and 6.4% is consistent with prediabetes and should be confirmed with a  follow-up test. . For someone with known diabetes, a value <7% indicates that their diabetes is well controlled. A1c targets should be individualized based on duration of diabetes, age, comorbid conditions, and other considerations. . This assay  result is consistent with an increased risk of diabetes. . Currently, no consensus exists regarding use of hemoglobin A1c for diagnosis of diabetes for children. .          Passed - eGFR in normal range and within 360 days    GFR, Est African American  Date Value Ref Range Status  11/09/2016 >89 >=60 mL/min Final   GFR calc Af Amer  Date Value Ref Range Status  01/05/2020 >60 >60 mL/min Final   GFR, Est Non African American  Date Value Ref Range Status  11/09/2016 79 >=60 mL/min Final   GFR calc non Af Amer  Date Value Ref Range Status  01/05/2020 >60 >60 mL/min Final   eGFR  Date Value Ref Range Status  07/29/2024 92 > OR = 60 mL/min/1.69m2 Final  02/22/2024 82 >59 mL/min/1.73 Final         Passed - B12 Level in normal range and within 720 days    Vitamin B-12  Date Value Ref Range Status  02/22/2024 873 232 - 1,245 pg/mL Final         Passed - Valid encounter within last 6 months    Recent Outpatient Visits           3 weeks ago Essential hypertension   [facility] Gareth Mliss FALCON, FNP              Passed - CBC within normal limits and completed in the last 12 months    WBC  Date Value Ref Range Status  07/29/2024 8.2 3.8 - 10.8 Thousand/uL Final   RBC  Date Value Ref Range Status  07/29/2024 5.51 4.20 - 5.80 Million/uL Final   Hemoglobin  Date Value Ref Range Status  07/29/2024 14.8 13.2 - 17.1  g/dL Final  95/88/7974 85.6 13.0 - 17.7 g/dL Final   HCT  Date Value Ref Range Status  07/29/2024 46.8 38.5 - 50.0 % Final   Hematocrit  Date Value Ref Range Status  02/22/2024 43.5 37.5 - 51.0 % Final   MCHC  Date Value Ref Range Status  07/29/2024 31.6 (L) 32.0 - 36.0 g/dL Final    Comment:    For adults, a slight decrease in the calculated MCHC value (in the range of 30 to 32 g/dL) is most likely not clinically significant; however, it should be interpreted with caution in correlation with other red cell parameters  and the patient's clinical condition.    [facility]  Date Value Ref Range Status  07/29/2024 26.9 (L) 27.0 - 33.0 pg Final   MCV  Date Value Ref Range Status  07/29/2024 84.9 80.0 - 100.0 fL Final  02/22/2024 81 79 - 97 fL Final   No results found for: PLTCOUNTKUC, LABPLAT, POCPLA RDW  Date Value Ref Range Status  07/29/2024 13.8 11.0 - 15.0 % Final  02/22/2024 13.9 11.6 - 15.4 % Final          Vitamin D , Ergocalciferol , (DRISDOL ) 1.25 MG (50000 UNIT) CAPS capsule 12 capsule 1     Endocrinology:  Vitamins - Vitamin D  Supplementation 2 Failed - 08/20/2024  4:15 PM      Failed - Manual Review: Route requests for 50,000 IU strength to the provider      Passed - Ca in normal range and within 360 days    Calcium   Date Value Ref Range Status  07/29/2024 9.7 8.6 - 10.3 mg/dL Final   Calcium , Ion  Date Value Ref Range Status  12/19/2019 1.12 (L) 1.15 - 1.40 mmol/L Final         Passed - Vitamin D  in normal range and within 360 days    Vit D, 25-Hydroxy  Date Value Ref Range Status  07/29/2024 85 30 - 100 ng/mL Final    Comment:    Vitamin D  Status         25-OH Vitamin D : . Deficiency:                    <20 ng/mL Insufficiency:             20 - 29 ng/mL Optimal:                 > or = 30 ng/mL . For 25-OH Vitamin D  testing on patients on  D2-supplementation and patients for whom quantitation  of D2 and D3 fractions is required, the QuestAssureD(TM) 25-OH VIT D, (D2,D3), LC/MS/MS is recommended: order  code 07111 (patients >66yrs). . See Note 1 . Note 1 . For additional information, please refer to  http://education.Quest[facility].com/faq/FAQ199  (This link is being provided for informational/ educational purposes only.)          Passed - Valid encounter within last 12 months    Recent Outpatient Visits           3 weeks ago Essential hypertension   [facility] Gareth Clarity F, FNP               FARXIGA  10 MG TABS tablet  90 tablet 3    Sig: Take 1 tablet (10 mg total) by mouth daily.     Endocrinology:  Diabetes - SGLT2 Inhibitors Passed - 08/20/2024  4:15 PM      Passed - Cr in normal range and  within 360 days    Creat  Date Value Ref Range Status  07/29/2024 0.97 0.70 - 1.30 mg/dL Final   Creatinine, POC  Date Value Ref Range Status  02/22/2024 100 mg/dL Final   Creatinine, Urine  Date Value Ref Range Status  11/09/2016 130 20 - 370 mg/dL Final         Passed - HBA1C is between 0 and 7.9 and within 180 days    Hemoglobin A1C  Date Value Ref Range Status  09/18/2022 6.5  Final   Hgb A1c MFr Bld  Date Value Ref Range Status  07/29/2024 6.4 (H) <5.7 % Final    Comment:    For someone without known diabetes, a hemoglobin  A1c value between 5.7% and 6.4% is consistent with prediabetes and should be confirmed with a  follow-up test. . For someone with known diabetes, a value <7% indicates that their diabetes is well controlled. A1c targets should be individualized based on duration of diabetes, age, comorbid conditions, and other considerations. . This assay result is consistent with an increased risk of diabetes. . Currently, no consensus exists regarding use of hemoglobin A1c for diagnosis of diabetes for children. .          Passed - eGFR in normal range and within 360 days    GFR, Est African American  Date Value Ref Range Status  11/09/2016 >89 >=60 mL/min Final   GFR calc Af Amer  Date Value Ref Range Status  01/05/2020 >60 >60 mL/min Final   GFR, Est Non African American  Date Value Ref Range Status  11/09/2016 79 >=60 mL/min Final   GFR calc non Af Amer  Date Value Ref Range Status  01/05/2020 >60 >60 mL/min Final   eGFR  Date Value Ref Range Status  07/29/2024 92 > OR = 60 mL/min/1.50m2 Final  02/22/2024 82 >59 mL/min/1.73 Final         Passed - Valid encounter within last 6 months    Recent Outpatient Visits           3 weeks ago Essential hypertension    [facility] Gareth Mliss FALCON, FNP               lisinopril  (ZESTRIL ) 20 MG tablet 90 tablet 1    Sig: Take 1 tablet (20 mg total) by mouth daily.     Cardiovascular:  ACE Inhibitors Passed - 08/20/2024  4:15 PM      Passed - Cr in normal range and within 180 days    Creat  Date Value Ref Range Status  07/29/2024 0.97 0.70 - 1.30 mg/dL Final   Creatinine, POC  Date Value Ref Range Status  02/22/2024 100 mg/dL Final   Creatinine, Urine  Date Value Ref Range Status  11/09/2016 130 20 - 370 mg/dL Final         Passed - K in normal range and within 180 days    Potassium  Date Value Ref Range Status  07/29/2024 4.7 3.5 - 5.3 mmol/L Final         Passed - Patient is not pregnant      Passed - Last BP in normal range    BP Readings from Last 1 Encounters:  07/29/24 132/82         Passed - Valid encounter within last 6 months    Recent Outpatient Visits           3 weeks ago Essential hypertension   [facility] Gareth Mliss FALCON, [place]

## 2024-08-21 ENCOUNTER — Other Ambulatory Visit: Payer: Self-pay | Admitting: Nurse Practitioner

## 2024-08-21 DIAGNOSIS — E1142 Type 2 diabetes mellitus with diabetic polyneuropathy: Secondary | ICD-10-CM

## 2024-08-21 DIAGNOSIS — N529 Male erectile dysfunction, unspecified: Secondary | ICD-10-CM

## 2024-08-21 DIAGNOSIS — E781 Pure hyperglyceridemia: Secondary | ICD-10-CM

## 2024-08-21 DIAGNOSIS — I369 Nonrheumatic tricuspid valve disorder, unspecified: Secondary | ICD-10-CM

## 2024-08-21 DIAGNOSIS — E782 Mixed hyperlipidemia: Secondary | ICD-10-CM

## 2024-08-21 DIAGNOSIS — I10 Essential (primary) hypertension: Secondary | ICD-10-CM

## 2024-08-21 DIAGNOSIS — D509 Iron deficiency anemia, unspecified: Secondary | ICD-10-CM

## 2024-08-21 DIAGNOSIS — R0602 Shortness of breath: Secondary | ICD-10-CM

## 2024-08-21 MED ORDER — ALBUTEROL SULFATE HFA 108 (90 BASE) MCG/ACT IN AERS: 2.0000 | INHALATION_SPRAY | Freq: Four times a day (QID) | RESPIRATORY_TRACT | 2 refills | Status: AC | PRN

## 2024-08-21 MED ORDER — FERROUS SULFATE 75 (15 FE) MG/ML PO SOLN: 65.0000 mg | Freq: Every day | ORAL | 1 refills | Status: DC

## 2024-08-21 MED ORDER — VASCEPA 1 G PO CAPS: 2.0000 g | ORAL_CAPSULE | Freq: Two times a day (BID) | ORAL | 5 refills | Status: DC

## 2024-08-21 MED ORDER — SILDENAFIL CITRATE 50 MG PO TABS: 50.0000 mg | ORAL_TABLET | ORAL | 0 refills | Status: AC | PRN

## 2024-08-21 MED ORDER — LISINOPRIL 20 MG PO TABS
20.0000 mg | ORAL_TABLET | Freq: Every day | ORAL | 1 refills | Status: AC
Start: 2024-08-21 — End: ?

## 2024-08-21 MED ORDER — ASPIRIN EC 81 MG PO TBEC: 81.0000 mg | DELAYED_RELEASE_TABLET | Freq: Every day | ORAL | 1 refills | Status: AC

## 2024-08-21 MED ORDER — ATORVASTATIN CALCIUM 10 MG PO TABS: 10.0000 mg | ORAL_TABLET | Freq: Every day | ORAL | 3 refills | Status: AC

## 2024-08-21 MED ORDER — FLUTICASONE PROPIONATE 50 MCG/ACT NA SUSP: 2.0000 | Freq: Every day | NASAL | 2 refills | Status: AC

## 2024-08-21 MED ORDER — FARXIGA 10 MG PO TABS: 10.0000 mg | ORAL_TABLET | Freq: Every day | ORAL | 3 refills | Status: AC

## 2024-08-21 MED ORDER — METFORMIN HCL 500 MG PO TABS: 500.0000 mg | ORAL_TABLET | Freq: Every day | ORAL | 3 refills | Status: AC

## 2024-08-22 ENCOUNTER — Other Ambulatory Visit (HOSPITAL_COMMUNITY): Payer: Self-pay

## 2024-08-22 ENCOUNTER — Telehealth: Payer: Self-pay | Admitting: Pharmacy Technician

## 2024-08-22 ENCOUNTER — Other Ambulatory Visit: Payer: Self-pay | Admitting: Nurse Practitioner

## 2024-08-22 DIAGNOSIS — E781 Pure hyperglyceridemia: Secondary | ICD-10-CM

## 2024-08-22 MED ORDER — ICOSAPENT ETHYL 1 G PO CAPS: 2.0000 g | ORAL_CAPSULE | Freq: Two times a day (BID) | ORAL | 1 refills | Status: AC

## 2024-08-22 NOTE — Telephone Encounter (Signed)
 Pharmacy Patient Advocate Encounter   Received notification from CoverMyMeds that prior authorization for Vascepa  1GM capsules is required/requested.   Insurance verification completed.   The patient is insured through [facility] MEDICAID.   Per test claim:  ICOSAPENT  ETHYL 1 G CAPSULE is preferred by the insurance.  If suggested medication is appropriate, Please send in a new RX and discontinue this one. If not, please advise as to why it's not appropriate so that we may request a Prior Authorization. Please note, some preferred medications may still require a PA.  If the suggested medications have not been trialed and there are no contraindications to their use, the PA will not be submitted, as it will not be approved.   Medicaid will no longer cover brand name Vascepa , if the patient can't take generic we need a documented medical reason why he can't

## 2024-09-04 ENCOUNTER — Other Ambulatory Visit: Payer: Self-pay | Admitting: Nurse Practitioner

## 2024-09-05 NOTE — Telephone Encounter (Signed)
 Requested medication (s) are due for refill today: yes  Requested medication (s) are on the active medication list: yes  Last refill:  02/20/24  Future visit scheduled: yes  Notes to clinic:  Manual Review: Route requests for 50,000 IU strength to the provider      Requested Prescriptions  Pending Prescriptions Disp Refills   Vitamin D , Ergocalciferol , (DRISDOL ) 1.25 MG (50000 UNIT) CAPS capsule [Pharmacy Med Name: Vitamin D  (Ergocalciferol ) 1.25 MG(50000 UT) CAPS] 4 capsule     Sig: TAKE 1 CAPSULE 1 TIME PER WEEK     Endocrinology:  Vitamins - Vitamin D  Supplementation 2 Failed - 09/05/2024  4:12 PM      Failed - Manual Review: Route requests for 50,000 IU strength to the provider      Passed - Ca in normal range and within 360 days    Calcium   Date Value Ref Range Status  07/29/2024 9.7 8.6 - 10.3 mg/dL Final   Calcium , Ion  Date Value Ref Range Status  12/19/2019 1.12 (L) 1.15 - 1.40 mmol/L Final         Passed - Vitamin D  in normal range and within 360 days    Vit D, 25-Hydroxy  Date Value Ref Range Status  07/29/2024 85 30 - 100 ng/mL Final    Comment:    Vitamin D  Status         25-OH Vitamin D : . Deficiency:                    <20 ng/mL Insufficiency:             20 - 29 ng/mL Optimal:                 > or = 30 ng/mL . For 25-OH Vitamin D  testing on patients on  D2-supplementation and patients for whom quantitation  of D2 and D3 fractions is required, the QuestAssureD(TM) 25-OH VIT D, (D2,D3), LC/MS/MS is recommended: order  code 07111 (patients >5yrs). . See Note 1 . Note 1 . For additional information, please refer to  http://education.Quest[facility].com/faq/FAQ199  (This link is being provided for informational/ educational purposes only.)          Passed - Valid encounter within last 12 months    Recent Outpatient Visits           1 month ago Essential hypertension   [facility] Gareth Mliss FAL[place], [place]

## 2024-09-17 ENCOUNTER — Other Ambulatory Visit: Payer: Self-pay | Admitting: Nurse Practitioner

## 2024-09-17 NOTE — Telephone Encounter (Unsigned)
 Copied from CRM [id number]. Topic: Clinical - Medication Refill >> Sep 17, 2024  2:21 PM Winona R wrote: Medication: Vitamin D , Ergocalciferol , (DRISDOL ) 1.25 MG (50000 UNIT) CAPS capsule [[id number]]  Has the patient contacted their pharmacy? Yes (Agent: If no, request that the patient contact the pharmacy for the refill. If patient does not wish to contact the pharmacy document the reason why and proceed with request.) (Agent: If yes, when and what did the pharmacy advise?)  This is the patient's preferred pharmacy:    [id number] - [place], [place] - [address] [place] [address] Phone: [phone] Fax: [phone]  Is this the correct pharmacy for this prescription? Yes If no, delete pharmacy and type the correct one.   Has the prescription been filled recently? No  Is the patient out of the medication? Yes  Has the patient been seen for an appointment in the last year OR does the patient have an upcoming appointment? Yes  Can we respond through MyChart? No  Agent: Please be advised that Rx refills may take up to 3 business days. We ask that you follow-up with your pharmacy.

## 2024-09-18 NOTE — Telephone Encounter (Signed)
 Requested medications are due for refill today.  yes  Requested medications are on the active medications list.  yes  Last refill. 4/9/205 #12 1 rf  Future visit scheduled.   yes  Notes to clinic.  Provider to review at this dosage.    Requested Prescriptions  Pending Prescriptions Disp Refills   Vitamin D , Ergocalciferol , (DRISDOL ) 1.25 MG (50000 UNIT) CAPS capsule 12 capsule 1     Endocrinology:  Vitamins - Vitamin D  Supplementation 2 Failed - 09/18/2024  4:40 PM      Failed - Manual Review: Route requests for 50,000 IU strength to the provider      Passed - Ca in normal range and within 360 days    Calcium   Date Value Ref Range Status  07/29/2024 9.7 8.6 - 10.3 mg/dL Final   Calcium , Ion  Date Value Ref Range Status  12/19/2019 1.12 (L) 1.15 - 1.40 mmol/L Final         Passed - Vitamin D  in normal range and within 360 days    Vit D, 25-Hydroxy  Date Value Ref Range Status  07/29/2024 85 30 - 100 ng/mL Final    Comment:    Vitamin D  Status         25-OH Vitamin D : . Deficiency:                    <20 ng/mL Insufficiency:             20 - 29 ng/mL Optimal:                 > or = 30 ng/mL . For 25-OH Vitamin D  testing on patients on  D2-supplementation and patients for whom quantitation  of D2 and D3 fractions is required, the QuestAssureD(TM) 25-OH VIT D, (D2,D3), LC/MS/MS is recommended: order  code 07111 (patients >63yrs). . See Note 1 . Note 1 . For additional information, please refer to  http://education.[facility].com/faq/FAQ199  (This link is being provided for informational/ educational purposes only.)          Passed - Valid encounter within last 12 months    Recent Outpatient Visits           1 month ago Essential hypertension   [facility] Gareth Mliss FAL[place], [place]

## 2024-10-21 ENCOUNTER — Other Ambulatory Visit: Payer: Self-pay | Admitting: Nurse Practitioner

## 2024-10-23 NOTE — Telephone Encounter (Signed)
 Requested medication (s) are due for refill today - unsure  Requested medication (s) are on the active medication list -yes  Future visit scheduled -yes  Last refill: 02/20/24 #12 1RF  Notes to clinic: high dose medication- requires provider review   Requested Prescriptions  Pending Prescriptions Disp Refills   Vitamin D , Ergocalciferol , (DRISDOL ) 1.25 MG (50000 UNIT) CAPS capsule [Pharmacy Med Name: Vitamin D  (Ergocalciferol ) 1.25 MG(50000 UT) CAPS] 4 capsule     Sig: TAKE 1 CAPSULE 1 TIME PER WEEK     Endocrinology:  Vitamins - Vitamin D  Supplementation 2 Failed - 10/23/2024 11:17 AM      Failed - Manual Review: Route requests for 50,000 IU strength to the provider      Passed - Ca in normal range and within 360 days    Calcium   Date Value Ref Range Status  07/29/2024 9.7 8.6 - 10.3 mg/dL Final   Calcium , Ion  Date Value Ref Range Status  12/19/2019 1.12 (L) 1.15 - 1.40 mmol/L Final         Passed - Vitamin D  in normal range and within 360 days    Vit D, 25-Hydroxy  Date Value Ref Range Status  07/29/2024 85 30 - 100 ng/mL Final    Comment:    Vitamin D  Status         25-OH Vitamin D : . Deficiency:                    <20 ng/mL Insufficiency:             20 - 29 ng/mL Optimal:                 > or = 30 ng/mL . For 25-OH Vitamin D  testing on patients on  D2-supplementation and patients for whom quantitation  of D2 and D3 fractions is required, the [employer]ureD(TM) 25-OH VIT D, (D2,D3), LC/MS/MS is recommended: order  code 07111 (patients >68yrs). . See Note 1 . Note 1 . For additional information, please refer to  http://education.[facility].com/faq/FAQ199  (This link is being provided for informational/ educational purposes only.)          Passed - Valid encounter within last 12 months    Recent Outpatient Visits           2 months ago Essential hypertension   [facility] Gareth Mliss [place], [place]                  Requested Prescriptions  Pending Prescriptions Disp Refills   Vitamin D , Ergocalciferol , (DRISDOL ) 1.25 MG (50000 UNIT) CAPS capsule [Pharmacy Med Name: Vitamin D  (Ergocalciferol ) 1.25 MG(50000 UT) CAPS] 4 capsule     Sig: TAKE 1 CAPSULE 1 TIME PER WEEK     Endocrinology:  Vitamins - Vitamin D  Supplementation 2 Failed - 10/23/2024 11:17 AM      Failed - Manual Review: Route requests for 50,000 IU strength to the provider      Passed - Ca in normal range and within 360 days    Calcium   Date Value Ref Range Status  07/29/2024 9.7 8.6 - 10.3 mg/dL Final   Calcium , Ion  Date Value Ref Range Status  12/19/2019 1.12 (L) 1.15 - 1.40 mmol/L Final         Passed - Vitamin D  in normal range and within 360 days    Vit D, 25-Hydroxy  Date Value Ref Range Status  07/29/2024 85 30 - 100 ng/mL Final  Comment:    Vitamin D  Status         25-OH Vitamin D : . Deficiency:                    <20 ng/mL Insufficiency:             20 - 29 ng/mL Optimal:                 > or = 30 ng/mL . For 25-OH Vitamin D  testing on patients on  D2-supplementation and patients for whom quantitation  of D2 and D3 fractions is required, the QuestAssureD(TM) 25-OH VIT D, (D2,D3), LC/MS/MS is recommended: order  code 07111 (patients >21yrs). . See Note 1 . Note 1 . For additional information, please refer to  http://education.[facility].com/faq/FAQ199  (This link is being provided for informational/ educational purposes only.)          Passed - Valid encounter within last 12 months    Recent Outpatient Visits           2 months ago Essential hypertension   [facility] Gareth Mliss [place], [place]

## 2024-11-17 ENCOUNTER — Telehealth: Payer: Self-pay

## 2024-11-17 MED ORDER — ACCU-CHEK GUIDE W/DEVICE KIT: PACK | 0 refills | Status: DC

## 2024-11-17 MED ORDER — ACCU-CHEK SOFTCLIX LANCETS MISC: 12 refills | Status: DC

## 2024-11-17 MED ORDER — GLUCOSE BLOOD VI STRP: ORAL_STRIP | 12 refills | Status: DC

## 2024-11-17 NOTE — Telephone Encounter (Signed)
"  Rx sent  "

## 2024-11-21 ENCOUNTER — Telehealth: Payer: Self-pay

## 2024-11-21 MED ORDER — ACCU-CHEK SOFTCLIX LANCETS MISC: 12 refills | Status: AC

## 2024-11-21 MED ORDER — ACCU-CHEK GUIDE W/DEVICE KIT: PACK | 0 refills | Status: AC

## 2024-11-21 MED ORDER — GLUCOSE BLOOD VI STRP: ORAL_STRIP | 12 refills | Status: AC

## 2024-11-21 NOTE — Telephone Encounter (Signed)
 Copied from CRM [id number]. Topic: Clinical - Prescription Issue >> Nov 21, 2024  3:30 PM Pinkey ORN wrote: Reason for CRM: Blood Glucose Monitoring Suppl (ACCU-CHEK GUIDE) w/Device KIT >> Nov 21, 2024  3:33 PM Pinkey ORN wrote: Roseline wife called on behalf of patient's glucose monitor. States they requested once but haven't received anything. I confirmed that the glucose monitor kit had been sent out to the pharmacy and confirmed received on 11/17/24, but she states it was sent to the incorrect pharmacy. Requesting that it be sent over to:   [facility], [place] - [address]  [address] [place] [address]  Phone: [phone] Fax: [phone]  Hours: Not open 24 hours

## 2024-11-28 ENCOUNTER — Encounter: Payer: Self-pay | Admitting: Nurse Practitioner

## 2024-11-28 ENCOUNTER — Ambulatory Visit: Admitting: Nurse Practitioner

## 2024-11-28 VITALS — BP 124/84 | HR 67 | Resp 16 | Ht 63.0 in | Wt 236.6 lb

## 2024-11-28 DIAGNOSIS — E1142 Type 2 diabetes mellitus with diabetic polyneuropathy: Secondary | ICD-10-CM

## 2024-11-28 DIAGNOSIS — I10 Essential (primary) hypertension: Secondary | ICD-10-CM

## 2024-11-28 DIAGNOSIS — I059 Rheumatic mitral valve disease, unspecified: Secondary | ICD-10-CM

## 2024-11-28 DIAGNOSIS — I2489 Other forms of acute ischemic heart disease: Secondary | ICD-10-CM

## 2024-11-28 DIAGNOSIS — M170 Bilateral primary osteoarthritis of knee: Secondary | ICD-10-CM

## 2024-11-28 DIAGNOSIS — I369 Nonrheumatic tricuspid valve disorder, unspecified: Secondary | ICD-10-CM

## 2024-11-28 DIAGNOSIS — Z6841 Body Mass Index (BMI) 40.0 and over, adult: Secondary | ICD-10-CM

## 2024-11-28 DIAGNOSIS — Z23 Encounter for immunization: Secondary | ICD-10-CM

## 2024-11-28 DIAGNOSIS — E782 Mixed hyperlipidemia: Secondary | ICD-10-CM

## 2024-11-28 DIAGNOSIS — G6289 Other specified polyneuropathies: Secondary | ICD-10-CM

## 2024-11-28 LAB — POCT GLYCOSYLATED HEMOGLOBIN (HGB A1C): Hemoglobin A1C: 6.7 % — AB (ref 4.0–5.6)

## 2024-11-28 MED ORDER — BLOOD GLUCOSE MONITORING SUPPL DEVI: 1.0000 | Freq: Three times a day (TID) | 0 refills | Status: AC

## 2024-11-28 MED ORDER — LANCETS MISC: 1.0000 | 0 refills | Status: AC

## 2024-11-28 MED ORDER — BLOOD GLUCOSE TEST VI STRP: 1.0000 | ORAL_STRIP | Freq: Three times a day (TID) | 0 refills | Status: AC

## 2024-11-28 MED ORDER — LANCET DEVICE MISC: 1.0000 | Freq: Three times a day (TID) | 0 refills | Status: AC

## 2024-11-28 NOTE — Progress Notes (Signed)
 "  BP 124/84   Pulse 67   Resp 16   Ht 5' 3 (1.6 m)   Wt 236 lb 9.6 oz (107.3 kg)   SpO2 94%   BMI 41.91 kg/m    Subjective:    Patient ID: Nathaniel JAGGERS, male    DOB: 07-Jul-1966, 59 y.o.   MRN: [id number]  HPI: Nathaniel Webb is a 59 y.o. male  Chief Complaint  Patient presents with   Medical Management of Chronic Issues    Pt has not had DM exam yet   Discussed the use of AI scribe software for clinical note transcription with the patient, who gave verbal consent to proceed.  History of Present Illness Nathaniel Webb is a 59 year old male who presents for a routine follow-up.  Allergic symptoms and cough - Persistent allergies despite medication use - Discontinued a strong allergy medication due to headaches - Discontinued doxycycline due to headaches - Cough persists despite medication - No chest pain or shortness of breath - Uses albuterol  inhaler as needed  Glycemic control and diabetic complications - Type 2 diabetes mellitus with peripheral neuropathy - Regular blood glucose monitoring with recent readings of 110 and 111 - A1c today is 6.7  Hypertension and hyperlipidemia management - Hypertension managed with lisinopril  20 mg daily - Hyperlipidemia managed with atorvastatin  10 mg daily - Last LDL was 75 - Takes aspirin  81 mg daily and Vascepa  2 grams twice a day  Cardiac and valvular disorders - History of tricuspid and mitral valve disorders - History of demand ischemia -managed by cardiology -taking asa 81 mg daily, atorvastatin  10 mg daily, farxiga  10 mg daily, vascepa  2 g BID, lisinopril  20 mg daily  Osteoarthritis and musculoskeletal symptoms - Osteoarthritis of both knees - No current knee pain  Obesity and lifestyle modification - Current weight 236 pounds - BMI 41.9 - Has a bike at home for exercise - Working on dietary changes including fasting and reducing meat consumption   Erectile dysfunction - Uses Viagra  50 mg as needed     Wt Readings from Last 3 Encounters:  11/28/24 236 lb 9.6 oz (107.3 kg)  07/29/24 227 lb (103 kg)  02/22/24 239 lb 9.6 oz (108.7 kg)     Flowsheet Row Office Visit from 11/28/2024 in [facility]  1 48 inches       11/28/2024    8:30 AM 07/29/2024    9:13 AM 02/22/2024    9:49 AM  Depression screen PHQ 2/9  Decreased Interest 0 0 3  Down, Depressed, Hopeless 0 0 0  PHQ - 2 Score 0 0 3  Altered sleeping  0 0  Tired, decreased energy  0 1  Change in appetite  0 0  Feeling bad or failure about yourself   0 0  Trouble concentrating  0 0  Moving slowly or fidgety/restless  0 0  Suicidal thoughts  0 0  PHQ-9 Score  0  4   Difficult doing work/chores  Not difficult at all      Data saved with a previous flowsheet row definition    Relevant past medical, surgical, family and social history reviewed and updated as indicated. Interim medical history since our last visit reviewed. Allergies and medications reviewed and updated.  Review of Systems  Constitutional: Negative for fever or weight change.  Respiratory: Negative for cough and shortness of breath.   Cardiovascular: Negative for chest pain or palpitations.  Gastrointestinal: Negative for abdominal  pain, no bowel changes.  Musculoskeletal: Negative for gait problem or joint swelling.  Skin: Negative for rash.  Neurological: Negative for dizziness or headache.  No other specific complaints in a complete review of systems (except as listed in HPI above).      Objective:      BP 124/84   Pulse 67   Resp 16   Ht 5' 3 (1.6 m)   Wt 236 lb 9.6 oz (107.3 kg)   SpO2 94%   BMI 41.91 kg/m    Wt Readings from Last 3 Encounters:  11/28/24 236 lb 9.6 oz (107.3 kg)  07/29/24 227 lb (103 kg)  02/22/24 239 lb 9.6 oz (108.7 kg)    Physical Exam VITALS: BP- 124/84 MEASUREMENTS: Weight- 236, BMI- 41.9. GENERAL: Alert, cooperative, well developed, no acute distress. HEENT: Normocephalic, normal  oropharynx, moist mucous membranes. CHEST: Clear to auscultation bilaterally, no wheezes, rhonchi, or crackles. CARDIOVASCULAR: Normal heart rate and rhythm, S1 and S2 normal without murmurs. ABDOMEN: Soft, non-tender, non-distended, without organomegaly, normal bowel sounds. EXTREMITIES: No cyanosis or edema, extremities normal. NEUROLOGICAL: Cranial nerves grossly intact, moves all extremities without gross motor or sensory deficit.   Diabetic Foot Exam - Simple   Simple Foot Form Diabetic Foot exam was performed with the following findings: Yes 11/28/2024  8:56 AM  Visual Inspection No deformities, no ulcerations, no other skin breakdown bilaterally: Yes Sensation Testing Intact to touch and monofilament testing bilaterally: Yes Pulse Check Posterior Tibialis and Dorsalis pulse intact bilaterally: Yes Comments     Results for orders placed or performed in visit on 11/28/24  POCT HgB A1C   Collection Time: 11/28/24  8:41 AM  Result Value Ref Range   Hemoglobin A1C 6.7 (A) 4.0 - 5.6 %   HbA1c POC (<> result, manual entry)     HbA1c, POC (prediabetic range)     HbA1c, POC (controlled diabetic range)            Assessment & Plan:   Problem List Items Addressed This Visit       Cardiovascular and Mediastinum   Essential hypertension   Demand ischemia (HCC)   Mitral valve disorder   Nonrheumatic tricuspid valve disorder     Endocrine   Type 2 diabetes, controlled, with peripheral neuropathy (HCC)   Relevant Medications   Blood Glucose Monitoring Suppl DEVI   Glucose Blood (BLOOD GLUCOSE TEST STRIPS) STRP   Lancet Device MISC   Lancets MISC   Other Relevant Orders   POCT HgB A1C (Completed)   HM Diabetes Foot Exam (Completed)     Nervous and Auditory   Peripheral neuropathy     Musculoskeletal and Integument   Osteoarthritis of both knees   Relevant Medications   predniSONE  (DELTASONE ) 10 MG tablet     Other   Morbid obesity (HCC)   Mixed hyperlipidemia    Other Visit Diagnoses       Immunization due    -  Primary   Relevant Orders   Pneumococcal conjugate vaccine 20-valent (Prevnar 20) (Completed)        Assessment and Plan Assessment & Plan Immunization update Due for pneumonia vaccine update.  Type 2 diabetes mellitus with diabetic polyneuropathy Diabetes is well-controlled with an A1c of 6.7%. Blood glucose levels are stable, with recent readings of 110 and 111 mg/dL. Diabetic foot exam was normal. Overdue for diabetic eye exam. - Schedule diabetic eye exam. - Sent request for new blood glucose meter. -continue taking metformin  500 mg daily, farxiga   10 mg daily  Essential hypertension Blood pressure is well-controlled at 124/84 mmHg. Continue taking lisinopril  20 mg daily,   Nonrheumatic tricuspid and mitral valve disorders No new symptoms reported. Managed by cardiology  Demand ischemia Managed by cardiology,  no symptoms  Bilateral primary osteoarthritis of knees Knees have not been bothering him lately.  Mixed hyperlipidemia LDL is well-controlled at 75 mg/dL.  Morbid obesity Weight is 236 pounds with a BMI of 41.9. Waist circumference is 48 inches. He is working on lifestyle modifications, including fasting and reducing meat intake. Comorbidities include DM, OA, HTN        Follow up plan: Return in about 6 months (around 05/28/2025) for follow up. "

## 2024-12-04 ENCOUNTER — Other Ambulatory Visit: Payer: Self-pay | Admitting: Family

## 2024-12-04 DIAGNOSIS — N529 Male erectile dysfunction, unspecified: Secondary | ICD-10-CM

## 2025-05-28 ENCOUNTER — Ambulatory Visit: Admitting: Nurse Practitioner
